# Patient Record
Sex: Female | Born: 1965
Health system: Southern US, Community
[De-identification: ages and names within clinical notes are randomized; demographics above are authoritative.]

## PROBLEM LIST (undated history)

## (undated) DIAGNOSIS — M5412 Radiculopathy, cervical region: Secondary | ICD-10-CM

## (undated) DIAGNOSIS — G43909 Migraine, unspecified, not intractable, without status migrainosus: Secondary | ICD-10-CM

## (undated) DIAGNOSIS — K760 Fatty (change of) liver, not elsewhere classified: Secondary | ICD-10-CM

## (undated) DIAGNOSIS — I10 Essential (primary) hypertension: Secondary | ICD-10-CM

## (undated) DIAGNOSIS — R1012 Left upper quadrant pain: Secondary | ICD-10-CM

## (undated) DIAGNOSIS — M24119 Other articular cartilage disorders, unspecified shoulder: Secondary | ICD-10-CM

## (undated) DIAGNOSIS — R232 Flushing: Secondary | ICD-10-CM

## (undated) DIAGNOSIS — J383 Other diseases of vocal cords: Secondary | ICD-10-CM

## (undated) DIAGNOSIS — Z8659 Personal history of other mental and behavioral disorders: Secondary | ICD-10-CM

## (undated) DIAGNOSIS — G4486 Cervicogenic headache: Secondary | ICD-10-CM

## (undated) DIAGNOSIS — M4802 Spinal stenosis, cervical region: Secondary | ICD-10-CM

## (undated) DIAGNOSIS — M542 Cervicalgia: Secondary | ICD-10-CM

## (undated) DIAGNOSIS — R0602 Shortness of breath: Secondary | ICD-10-CM

## (undated) DIAGNOSIS — O039 Complete or unspecified spontaneous abortion without complication: Secondary | ICD-10-CM

## (undated) DIAGNOSIS — M5030 Other cervical disc degeneration, unspecified cervical region: Secondary | ICD-10-CM

## (undated) DIAGNOSIS — R600 Localized edema: Secondary | ICD-10-CM

## (undated) DIAGNOSIS — K219 Gastro-esophageal reflux disease without esophagitis: Secondary | ICD-10-CM

## (undated) DIAGNOSIS — D3610 Benign neoplasm of peripheral nerves and autonomic nervous system, unspecified: Secondary | ICD-10-CM

## (undated) DIAGNOSIS — F419 Anxiety disorder, unspecified: Secondary | ICD-10-CM

## (undated) DIAGNOSIS — K589 Irritable bowel syndrome without diarrhea: Secondary | ICD-10-CM

## (undated) DIAGNOSIS — M539 Dorsopathy, unspecified: Secondary | ICD-10-CM

## (undated) DIAGNOSIS — M797 Fibromyalgia: Secondary | ICD-10-CM

## (undated) DIAGNOSIS — K649 Unspecified hemorrhoids: Secondary | ICD-10-CM

## (undated) DIAGNOSIS — G894 Chronic pain syndrome: Secondary | ICD-10-CM

## (undated) DIAGNOSIS — G8929 Other chronic pain: Secondary | ICD-10-CM

## (undated) HISTORY — DX: Fatty (change of) liver, not elsewhere classified: K76.0

## (undated) HISTORY — PX: CHOLECYSTECTOMY: SHX55

## (undated) HISTORY — PX: ELBOW DEBRIDEMENT: SHX931

## (undated) HISTORY — DX: Migraine, unspecified, not intractable, without status migrainosus: G43.909

## (undated) HISTORY — PX: COLONOSCOPY: SHX174

## (undated) HISTORY — DX: Left upper quadrant pain: R10.12

## (undated) HISTORY — PX: ABDOMINAL HYSTERECTOMY: SHX81

## (undated) HISTORY — DX: Complete or unspecified spontaneous abortion without complication: O03.9

## (undated) HISTORY — DX: Unspecified hemorrhoids: K64.9

## (undated) HISTORY — PX: FOOT SURGERY: SHX648

---

## 2014-09-24 LAB — HM COLONOSCOPY

## 2016-07-18 DIAGNOSIS — M5136 Other intervertebral disc degeneration, lumbar region: Secondary | ICD-10-CM | POA: Insufficient documentation

## 2016-07-18 DIAGNOSIS — Z800 Family history of malignant neoplasm of digestive organs: Secondary | ICD-10-CM | POA: Insufficient documentation

## 2016-10-25 DIAGNOSIS — R1012 Left upper quadrant pain: Secondary | ICD-10-CM | POA: Insufficient documentation

## 2016-10-25 HISTORY — DX: Left upper quadrant pain: R10.12

## 2016-12-14 LAB — HM COLONOSCOPY

## 2016-12-15 ENCOUNTER — Encounter (HOSPITAL_COMMUNITY): Payer: Self-pay

## 2016-12-15 DIAGNOSIS — I10 Essential (primary) hypertension: Secondary | ICD-10-CM | POA: Insufficient documentation

## 2016-12-15 DIAGNOSIS — R109 Unspecified abdominal pain: Secondary | ICD-10-CM | POA: Diagnosis present

## 2016-12-15 DIAGNOSIS — Z90710 Acquired absence of both cervix and uterus: Secondary | ICD-10-CM | POA: Diagnosis not present

## 2016-12-15 DIAGNOSIS — Z882 Allergy status to sulfonamides status: Secondary | ICD-10-CM | POA: Insufficient documentation

## 2016-12-15 DIAGNOSIS — K922 Gastrointestinal hemorrhage, unspecified: Secondary | ICD-10-CM | POA: Diagnosis present

## 2016-12-15 DIAGNOSIS — K760 Fatty (change of) liver, not elsewhere classified: Secondary | ICD-10-CM | POA: Insufficient documentation

## 2016-12-15 DIAGNOSIS — K589 Irritable bowel syndrome without diarrhea: Secondary | ICD-10-CM | POA: Diagnosis not present

## 2016-12-15 DIAGNOSIS — K625 Hemorrhage of anus and rectum: Secondary | ICD-10-CM | POA: Diagnosis present

## 2016-12-15 DIAGNOSIS — F329 Major depressive disorder, single episode, unspecified: Secondary | ICD-10-CM | POA: Insufficient documentation

## 2016-12-15 DIAGNOSIS — Z881 Allergy status to other antibiotic agents status: Secondary | ICD-10-CM | POA: Diagnosis not present

## 2016-12-15 DIAGNOSIS — M797 Fibromyalgia: Secondary | ICD-10-CM | POA: Diagnosis not present

## 2016-12-15 DIAGNOSIS — K219 Gastro-esophageal reflux disease without esophagitis: Secondary | ICD-10-CM | POA: Diagnosis not present

## 2016-12-15 DIAGNOSIS — Z79899 Other long term (current) drug therapy: Secondary | ICD-10-CM | POA: Insufficient documentation

## 2016-12-15 DIAGNOSIS — Z9049 Acquired absence of other specified parts of digestive tract: Secondary | ICD-10-CM | POA: Insufficient documentation

## 2016-12-15 LAB — URINALYSIS, ROUTINE W REFLEX MICROSCOPIC
Bilirubin Urine: NEGATIVE
GLUCOSE, UA: NEGATIVE mg/dL
HGB URINE DIPSTICK: NEGATIVE
Ketones, ur: 5 mg/dL — AB
LEUKOCYTES UA: NEGATIVE
Nitrite: NEGATIVE
PROTEIN: 30 mg/dL — AB
Specific Gravity, Urine: 1.026 (ref 1.005–1.030)
pH: 5 (ref 5.0–8.0)

## 2016-12-15 LAB — LIPASE, BLOOD: LIPASE: 33 U/L (ref 11–51)

## 2016-12-15 LAB — CBC
HEMATOCRIT: 40 % (ref 36.0–46.0)
Hemoglobin: 13.5 g/dL (ref 12.0–15.0)
MCH: 30.3 pg (ref 26.0–34.0)
MCHC: 33.8 g/dL (ref 30.0–36.0)
MCV: 89.7 fL (ref 78.0–100.0)
PLATELETS: 338 10*3/uL (ref 150–400)
RBC: 4.46 MIL/uL (ref 3.87–5.11)
RDW: 12.9 % (ref 11.5–15.5)
WBC: 11.2 10*3/uL — AB (ref 4.0–10.5)

## 2016-12-15 LAB — COMPREHENSIVE METABOLIC PANEL
ALT: 19 U/L (ref 14–54)
AST: 18 U/L (ref 15–41)
Albumin: 3.9 g/dL (ref 3.5–5.0)
Alkaline Phosphatase: 69 U/L (ref 38–126)
Anion gap: 12 (ref 5–15)
BILIRUBIN TOTAL: 0.6 mg/dL (ref 0.3–1.2)
BUN: 15 mg/dL (ref 6–20)
CHLORIDE: 103 mmol/L (ref 101–111)
CO2: 22 mmol/L (ref 22–32)
CREATININE: 1.02 mg/dL — AB (ref 0.44–1.00)
Calcium: 9 mg/dL (ref 8.9–10.3)
Glucose, Bld: 143 mg/dL — ABNORMAL HIGH (ref 65–99)
POTASSIUM: 3.7 mmol/L (ref 3.5–5.1)
Sodium: 137 mmol/L (ref 135–145)
Total Protein: 6.5 g/dL (ref 6.5–8.1)

## 2016-12-15 MED ORDER — ONDANSETRON 4 MG PO TBDP
4.0000 mg | ORAL_TABLET | Freq: Once | ORAL | Status: AC | PRN
  Administered 2016-12-15: 4 mg via ORAL

## 2016-12-15 MED ORDER — ONDANSETRON 4 MG PO TBDP
ORAL_TABLET | ORAL | Status: AC
  Filled 2016-12-15: qty 1

## 2016-12-15 NOTE — ED Triage Notes (Signed)
Pt states that she has a colonoscopy yesterday and has a polyp removed, today while at work started n/v x 1 and became diaphoretic after bloody BM.

## 2016-12-16 ENCOUNTER — Encounter (HOSPITAL_COMMUNITY): Payer: Self-pay | Admitting: Internal Medicine

## 2016-12-16 ENCOUNTER — Observation Stay (HOSPITAL_COMMUNITY): Payer: BLUE CROSS/BLUE SHIELD

## 2016-12-16 ENCOUNTER — Observation Stay (HOSPITAL_COMMUNITY)
Admission: EM | Admit: 2016-12-16 | Discharge: 2016-12-16 | Disposition: A | Discharge status: Home / Self Care | Payer: BLUE CROSS/BLUE SHIELD | Attending: Internal Medicine | Admitting: Internal Medicine

## 2016-12-16 DIAGNOSIS — M533 Sacrococcygeal disorders, not elsewhere classified: Secondary | ICD-10-CM

## 2016-12-16 DIAGNOSIS — K922 Gastrointestinal hemorrhage, unspecified: Principal | ICD-10-CM

## 2016-12-16 DIAGNOSIS — I10 Essential (primary) hypertension: Secondary | ICD-10-CM

## 2016-12-16 DIAGNOSIS — R109 Unspecified abdominal pain: Secondary | ICD-10-CM

## 2016-12-16 DIAGNOSIS — F329 Major depressive disorder, single episode, unspecified: Secondary | ICD-10-CM

## 2016-12-16 DIAGNOSIS — K5900 Constipation, unspecified: Secondary | ICD-10-CM

## 2016-12-16 DIAGNOSIS — K625 Hemorrhage of anus and rectum: Secondary | ICD-10-CM

## 2016-12-16 DIAGNOSIS — G8929 Other chronic pain: Secondary | ICD-10-CM | POA: Diagnosis present

## 2016-12-16 HISTORY — DX: Essential (primary) hypertension: I10

## 2016-12-16 HISTORY — DX: Irritable bowel syndrome, unspecified: K58.9

## 2016-12-16 HISTORY — DX: Flushing: R23.2

## 2016-12-16 HISTORY — DX: Fibromyalgia: M79.7

## 2016-12-16 LAB — CBC
HCT: 32.5 % — ABNORMAL LOW (ref 36.0–46.0)
HEMATOCRIT: 34.5 % — AB (ref 36.0–46.0)
HEMOGLOBIN: 11.6 g/dL — AB (ref 12.0–15.0)
Hemoglobin: 11 g/dL — ABNORMAL LOW (ref 12.0–15.0)
MCH: 30.1 pg (ref 26.0–34.0)
MCH: 30.4 pg (ref 26.0–34.0)
MCHC: 33.6 g/dL (ref 30.0–36.0)
MCHC: 33.8 g/dL (ref 30.0–36.0)
MCV: 89.4 fL (ref 78.0–100.0)
MCV: 89.8 fL (ref 78.0–100.0)
Platelets: 233 10*3/uL (ref 150–400)
Platelets: 222 10*3/uL (ref 150–400)
RBC: 3.86 MIL/uL — ABNORMAL LOW (ref 3.87–5.11)
RBC: 3.62 MIL/uL — AB (ref 3.87–5.11)
RDW: 13.4 % (ref 11.5–15.5)
RDW: 13.1 % (ref 11.5–15.5)
WBC: 10 10*3/uL (ref 4.0–10.5)
WBC: 9.9 10*3/uL (ref 4.0–10.5)

## 2016-12-16 LAB — TYPE AND SCREEN
ABO/RH(D): A POS
ANTIBODY SCREEN: NEGATIVE

## 2016-12-16 LAB — ABO/RH: ABO/RH(D): A POS

## 2016-12-16 MED ORDER — HYDROCHLOROTHIAZIDE 25 MG PO TABS
12.5000 mg | ORAL_TABLET | Freq: Every day | ORAL | Status: DC
  Filled 2016-12-16: qty 1

## 2016-12-16 MED ORDER — LINZESS 290 MCG PO CAPS: 290.0000 ug | ORAL_CAPSULE | Freq: Every day | ORAL | Status: DC

## 2016-12-16 MED ORDER — ACETAMINOPHEN 325 MG PO TABS: 650.0000 mg | ORAL_TABLET | Freq: Four times a day (QID) | ORAL | Status: DC | PRN

## 2016-12-16 MED ORDER — ACETAMINOPHEN 650 MG RE SUPP: 650.0000 mg | Freq: Four times a day (QID) | RECTAL | Status: DC | PRN

## 2016-12-16 MED ORDER — GABAPENTIN 300 MG PO CAPS
300.0000 mg | ORAL_CAPSULE | Freq: Two times a day (BID) | ORAL | Status: DC
  Administered 2016-12-16: 300 mg via ORAL
  Filled 2016-12-16: qty 1

## 2016-12-16 MED ORDER — TRAMADOL HCL 50 MG PO TABS
50.0000 mg | ORAL_TABLET | Freq: Four times a day (QID) | ORAL | Status: DC | PRN
  Administered 2016-12-16: 50 mg via ORAL
  Filled 2016-12-16: qty 1

## 2016-12-16 MED ORDER — CEFUROXIME AXETIL 500 MG PO TABS
500.0000 mg | ORAL_TABLET | Freq: Two times a day (BID) | ORAL | Status: DC
  Administered 2016-12-16 (×2): 500 mg via ORAL
  Filled 2016-12-16 (×3): qty 1

## 2016-12-16 MED ORDER — SERTRALINE HCL 50 MG PO TABS: 50.0000 mg | ORAL_TABLET | Freq: Every day | ORAL | Status: DC

## 2016-12-16 MED ORDER — SODIUM CHLORIDE 0.9% FLUSH: 3.0000 mL | Freq: Two times a day (BID) | INTRAVENOUS | Status: DC

## 2016-12-16 MED ORDER — PREDNISONE 20 MG PO TABS: 20.0000 mg | ORAL_TABLET | Freq: Every day | ORAL | Status: DC

## 2016-12-16 MED ORDER — IOPAMIDOL (ISOVUE-300) INJECTION 61%
INTRAVENOUS | Status: AC
  Administered 2016-12-16: 100 mL
  Filled 2016-12-16: qty 100

## 2016-12-16 MED ORDER — OXYCODONE-ACETAMINOPHEN 5-325 MG PO TABS
1.0000 | ORAL_TABLET | Freq: Four times a day (QID) | ORAL | Status: DC | PRN
  Administered 2016-12-16: 1 via ORAL
  Filled 2016-12-16: qty 1

## 2016-12-16 MED ORDER — PREDNISONE 10 MG PO TABS: 10.0000 mg | ORAL_TABLET | Freq: Every day | ORAL | Status: DC

## 2016-12-16 MED ORDER — PANTOPRAZOLE SODIUM 40 MG PO TBEC
40.0000 mg | DELAYED_RELEASE_TABLET | Freq: Every day | ORAL | Status: DC
  Administered 2016-12-16: 40 mg via ORAL
  Filled 2016-12-16: qty 1

## 2016-12-16 MED ORDER — PROMETHAZINE HCL 25 MG/ML IJ SOLN: 6.2500 mg | Freq: Four times a day (QID) | INTRAMUSCULAR | Status: DC | PRN

## 2016-12-16 MED ORDER — PREDNISONE 10 MG (21) PO TBPK: 10.0000 mg | ORAL_TABLET | ORAL | Status: DC

## 2016-12-16 MED ORDER — ONDANSETRON HCL 4 MG/2ML IJ SOLN: 4.0000 mg | Freq: Four times a day (QID) | INTRAMUSCULAR | Status: DC | PRN

## 2016-12-16 MED ORDER — PREDNISONE 20 MG PO TABS: 30.0000 mg | ORAL_TABLET | Freq: Every day | ORAL | Status: DC

## 2016-12-16 MED ORDER — PREDNISONE 20 MG PO TABS
40.0000 mg | ORAL_TABLET | Freq: Every day | ORAL | Status: AC
  Administered 2016-12-16: 40 mg via ORAL
  Filled 2016-12-16: qty 2

## 2016-12-16 NOTE — Discharge Summary (Signed)
Physician Discharge Summary  [name] JHE:[id number] DOB: [birth year] DOA: 12/16/2016  PCP: Clarisse Gouge, MD  Admit date: 12/16/2016 Discharge date: 12/16/2016  Time spent: 45 minutes  Recommendations for Outpatient Follow-up:  Patient will be discharged to home.  Patient will need to follow up with primary care provider within one week of discharge.  Patient should continue medications as prescribed.  Patient should follow a heart healthy diet.   Discharge Diagnoses:  Rectal bleeding Hypertension GERD Depression Constipation  Discharge Condition: stable   Diet recommendation: heart healthy  Filed Weights   12/16/16 0852  Weight: 72.7 kg (160 lb 3.2 oz)    History of present illness:  On 12/16/2016 by Dr. Jani Gravel  Elizabeth Bowers  is a 51 y.o. female, w Hypertension, Jerrye Bowers,  family hx of colon cancer, s/p colonscopy 7/12, polypectomy who presents with rectal bleeding x2 starting yesterday after she came to work last nite.  Pt also noted some abdominal pain generalized starting after midnite.  Denies fever, chills, diarrhea, black stool. Pt presented to ED for evaluation  In ED, Hgb 13.5,  Pt will be admitted for rectal bleed x2.    Hospital Course:  Rectal bleeding -Likely secondary to recent colonoscopy with polypectomy on 12/14/2016 -Hemoglobin baseline 13, currently 11 -Suspect drop in hemoglobin lagging given patient reported loosing large amount of blood yesterday prior to admission -Admitting physician spoke to gastroenterology who recommended CT abdomen -Admitting physician also spoke with [facility], felt to bleed is likely self-limited and can follow-up as an outpatient -CT abdomen pelvis: No acute findings, no evidence of complication following colonoscopy, no evidence of bowel injury or perforation -Patient would like to go home today, will follow up with her GI doc or return to the hospital if bleeding recurs. Currently patient has had no further episodes of bleeding.    -Discussed signs and symptoms of anemia.  -repeat CBC in one week.   Hypertension -Continue HCTZ  GERD -Continue PPI  Depression -Continue Zoloft  Constipation -Continue linzess  Procedures: None  Consultations: Gastroenterology via phone  Discharge Exam: Vitals:   12/16/16 0852 12/16/16 1522  BP: (!) 129/54 118/64  Pulse: 60 63  Resp: 16 16  Temp: 98.5 F (36.9 C) 98.2 F (36.8 C)   Denies further bleeding. Feels very tired it would like to sleep. Currently denies chest pain, shortness breath, abdominal pain, nausea or vomiting, dizziness.   General: Well developed, well nourished, NAD, appears stated age  HEENT: NCAT, mucous membranes moist.  Cardiovascular: S1 S2 auscultated, no rubs, murmurs or gallops. Regular rate and rhythm.  Respiratory: Clear to auscultation bilaterally with equal chest rise  Abdomen: Soft, nontender, nondistended, + bowel sounds  Extremities: warm dry without cyanosis clubbing or edema  Neuro: AAOx3, nonfocal  Psych: Normal affect and demeanor with intact judgement and insight  Discharge Instructions Discharge Instructions    Discharge instructions    Complete by:  As directed    Patient will be discharged to home.  Patient will need to follow up with primary care provider within one week of discharge, repeat CBC. Follow up with [place] gastro[facility].  Patient should continue medications as prescribed.  Patient should follow a heart healthy diet.     Current Discharge Medication List    CONTINUE these medications which have CHANGED   Details  LINZESS 290 MCG CAPS capsule Take 1 capsule (290 mcg total) by mouth daily. Qty: 30 capsule      CONTINUE these medications which have NOT CHANGED  Details  cefdinir (OMNICEF) 300 MG capsule Take 300 mg by mouth 2 (two) times daily. Refills: 0    DEXILANT 60 MG capsule Take 60 mg by mouth 2 (two) times daily.    gabapentin (NEURONTIN) 300 MG capsule Take 300 mg by mouth 2 (two)  times daily.    hydrochlorothiazide (HYDRODIURIL) 25 MG tablet Take 12.5 mg by mouth daily.    !! Multiple Vitamin (MULTIVITAMIN WITH MINERALS) TABS tablet Take 1 tablet by mouth daily.    !! Multiple Vitamins-Minerals (HAIR/SKIN/NAILS) TABS Take 1 tablet by mouth daily.    POTASSIUM PO Take 1 tablet by mouth daily. OTC    predniSONE (STERAPRED UNI-PAK 21 TAB) 10 MG (21) TBPK tablet Take 10-60 mg by mouth See admin instructions. Take 6 tablets for 2 days then decrease by 1 pill every 2 days for 12 days    sertraline (ZOLOFT) 100 MG tablet Take 50-100 mg by mouth at bedtime. Refills: 3     !! - Potential duplicate medications found. Please discuss with provider.     Allergies  Allergen Reactions  . Ranitidine Diarrhea and Other (See Comments)    Diarrhea and stomach cramping  . Amphetamine-Dextroamphetamine Other (See Comments)    Elevated BP  . Reglan [Metoclopramide] Other (See Comments)    Increased prolactin level   . Sulfa Antibiotics Anxiety   Follow-up Information    Clarisse Gouge, MD. Schedule an appointment as soon as possible for a visit in 1 week(s).   Specialty:  Family Medicine Why:  Hospital follow up Contact information: [address] [place] [address] [phone]            The results of significant diagnostics from this hospitalization (including imaging, microbiology, ancillary and laboratory) are listed below for reference.    Significant Diagnostic Studies: Ct Abdomen Pelvis W Contrast  Result Date: 12/16/2016 CLINICAL DATA:  Pt states that she has a colonoscopy 12/14/2016, had a polyp removed, Yesterday about 10:00pm at work started n/v and became diaphoretic after bloody BM. EXAM: CT ABDOMEN AND PELVIS WITH CONTRAST TECHNIQUE: Multidetector CT imaging of the abdomen and pelvis was performed using the standard protocol following bolus administration of intravenous contrast. CONTRAST:  130mL ISOVUE-300 IOPAMIDOL  (ISOVUE-300) INJECTION 61% COMPARISON:  None. FINDINGS: Lower chest: Clear lung bases.  Heart normal in size. Hepatobiliary: Fatty infiltration of the liver. No liver mass. Small area of focal fatty infiltration adjacent to the falciform ligament. Gallbladder surgically absent. No bile duct dilation. Pancreas: Unremarkable. No pancreatic ductal dilatation or surrounding inflammatory changes. Spleen: Normal in size without focal abnormality. Adrenals/Urinary Tract: Adrenal glands are unremarkable. Kidneys are normal, without renal calculi, focal lesion, or hydronephrosis. Bladder is unremarkable. Stomach/Bowel: No colonic wall thickening, inflammation or evidence of perforation. Stomach and small bowel are unremarkable. No free air. Vascular/Lymphatic: No significant vascular findings are present. No enlarged abdominal or pelvic lymph nodes. Reproductive: Status post hysterectomy. No adnexal masses. Other: No abdominal wall hernia or abnormality. No abdominopelvic ascites. Musculoskeletal: No fracture or acute finding. No osteoblastic or osteolytic lesions. Significant degenerative changes are noted at L5-S1. IMPRESSION: 1. No acute findings. No evidence of a complication following colonoscopy. Specifically no evidence of bowel injury or perforation. 2. Hepatic steatosis. 3. Status post cholecystectomy and hysterectomy. Electronically Signed   By: Lajean Manes M.D.   On: 12/16/2016 08:24    Microbiology: No results found for this or any previous visit (from the past 240 hour(s)).   Labs: Basic Metabolic Panel:  Recent Labs Lab 12/15/16 2235  NA 137  K 3.7  CL 103  CO2 22  GLUCOSE 143*  BUN 15  CREATININE 1.02*  CALCIUM 9.0   Liver Function Tests:  Recent Labs Lab 12/15/16 2235  AST 18  ALT 19  ALKPHOS 69  BILITOT 0.6  PROT 6.5  ALBUMIN 3.9    Recent Labs Lab 12/15/16 2235  LIPASE 33   No results for input(s): AMMONIA in the last 168 hours. CBC:  Recent Labs Lab  12/15/16 2235 12/16/16 0904 12/16/16 1742  WBC 11.2* 10.0 9.9  HGB 13.5 11.6* 11.0*  HCT 40.0 34.5* 32.5*  MCV 89.7 89.4 89.8  PLT 338 233 222   Cardiac Enzymes: No results for input(s): CKTOTAL, CKMB, CKMBINDEX, TROPONINI in the last 168 hours. BNP: BNP (last 3 results) No results for input(s): BNP in the last 8760 hours.  ProBNP (last 3 results) No results for input(s): PROBNP in the last 8760 hours.  CBG: No results for input(s): GLUCAP in the last 168 hours.     SignedCristal Ford  [facility] 12/16/2016, 6:16 PM

## 2016-12-16 NOTE — ED Provider Notes (Signed)
[place] DEPT Provider Note   CSN: [id number] Arrival date & time: 12/15/16  2220     History   Chief Complaint Chief Complaint  Patient presents with  . Rectal Bleeding  . Emesis    HPI Elizabeth Bowers is a 51 y.o. female.  HPI Patient is a 51 year old female presents emergency department with bright red blood from her rectum twice tonight.  She underwent colonoscopy yesterday at [facility] and underwent polypectomy at that time.  She is not on anticoagulants.  She states after the procedure she had a normal brown bowel movement.  The acute bleeding was noted tonight.  She initially had a significant amount of blood on the first episode.  She reports the second episode was less.  She has no lightheadedness or weakness.  Denies significant abdominal pain.  Symptoms are mild to moderate in severity   Past Medical History:  Diagnosis Date  . Fibromyalgia   . Hot flashes   . Hypertension   . IBS (irritable bowel syndrome)     There are no active problems to display for this patient.   Past Surgical History:  Procedure Laterality Date  . ABDOMINAL HYSTERECTOMY    . CHOLECYSTECTOMY    . ELBOW DEBRIDEMENT    . FOOT SURGERY      OB History    No data available       Home Medications    Prior to Admission medications   Medication Sig Start Date End Date Taking? Authorizing Provider  cefdinir (OMNICEF) 300 MG capsule Take 300 mg by mouth 2 (two) times daily. 12/11/16  Yes [provider]  DEXILANT 60 MG capsule Take 60 mg by mouth 2 (two) times daily. 11/21/16  Yes [provider]  gabapentin (NEURONTIN) 300 MG capsule Take 300 mg by mouth 2 (two) times daily. 12/13/16  Yes [provider]  hydrochlorothiazide (HYDRODIURIL) 25 MG tablet Take 12.5 mg by mouth daily. 12/14/16  Yes [provider]  LINZESS 290 MCG CAPS capsule Take 290 mg by mouth daily. 12/13/16  Yes [provider]  Multiple Vitamin (MULTIVITAMIN WITH MINERALS)  TABS tablet Take 1 tablet by mouth daily.   Yes [provider]  Multiple Vitamins-Minerals (HAIR/SKIN/NAILS) TABS Take 1 tablet by mouth daily.   Yes [provider]  POTASSIUM PO Take 1 tablet by mouth daily. OTC   Yes [provider]  predniSONE (STERAPRED UNI-PAK 21 TAB) 10 MG (21) TBPK tablet Take 10-60 mg by mouth See admin instructions. Take 6 tablets for 2 days then decrease by 1 pill every 2 days for 12 days 12/11/16  Yes [provider]  sertraline (ZOLOFT) 100 MG tablet Take 50-100 mg by mouth at bedtime. 11/20/16  Yes [provider]    Family History No family history on file.  Social History Social History  Substance Use Topics  . Smoking status: Never Smoker  . Smokeless tobacco: Never Used  . Alcohol use No     Allergies   Ranitidine; Amphetamine-dextroamphetamine; Reglan [metoclopramide]; and Sulfa antibiotics   Review of Systems Review of Systems  All other systems reviewed and are negative.    Physical Exam Updated Vital Signs BP 126/80 (BP Location: Right Arm)   Pulse (!) 56   Temp 98.4 F (36.9 C) (Oral)   Resp 16   SpO2 100%   Physical Exam  Constitutional: She is oriented to person, place, and time. She appears well-developed and well-nourished.  HENT:  Head: Normocephalic.  Eyes: EOM are  normal.  Neck: Normal range of motion.  Cardiovascular: Normal rate.   Pulmonary/Chest: Effort normal and breath sounds normal.  Abdominal: She exhibits no distension. There is no tenderness.  Musculoskeletal: Normal range of motion.  Neurological: She is alert and oriented to person, place, and time.  Psychiatric: She has a normal mood and affect.  Nursing note and vitals reviewed.    ED Treatments / Results  Labs (all labs ordered are listed, but only abnormal results are displayed) Labs Reviewed  COMPREHENSIVE METABOLIC PANEL - Abnormal; Notable for the following:       Result Value   Glucose, Bld 143 (*)     Creatinine, Ser 1.02 (*)    All other components within normal limits  CBC - Abnormal; Notable for the following:    WBC 11.2 (*)    All other components within normal limits  URINALYSIS, ROUTINE W REFLEX MICROSCOPIC - Abnormal; Notable for the following:    Color, Urine AMBER (*)    APPearance CLOUDY (*)    Ketones, ur 5 (*)    Protein, ur 30 (*)    Bacteria, UA RARE (*)    Squamous Epithelial / LPF 6-30 (*)    All other components within normal limits  LIPASE, BLOOD    EKG  EKG Interpretation None       Radiology No results found.  Procedures Procedures (including critical care time)  Medications Ordered in ED Medications  ondansetron (ZOFRAN-ODT) disintegrating tablet 4 mg (4 mg Oral Given 12/15/16 2235)     Initial Impression / Assessment and Plan / ED Course  I have reviewed the triage vital signs and the nursing notes.  Pertinent labs & imaging results that were available during my care of the patient were reviewed by me and considered in my medical decision making (see chart for details).      Patient will be admitted to the hospital for lower GI bleed.   Last episode was less bleeding than the first.      Final Clinical Impressions(s) / ED Diagnoses   Final diagnoses:  Lower GI bleeding    New Prescriptions New Prescriptions   No medications on file     Jola Schmidt, MD 12/16/16 [phone]

## 2016-12-16 NOTE — Discharge Instructions (Signed)
Gastrointestinal Bleeding °Gastrointestinal (GI) bleeding is bleeding somewhere along the digestive tract, between the mouth and anus. This can be caused by various problems. The severity of these problems can range from mild to serious or even life-threatening. If you have GI bleeding, you may find blood in your stools (feces), you may have black stools, or you may vomit blood. If there is a lot of bleeding, you may need to stay in the hospital. °What are the causes? °This condition may be caused by: °· Esophagitis. This is inflammation, irritation, or swelling of the esophagus. °· Hemorrhoids. These are swollen veins in the rectum. °· Anal fissures. These are areas of painful tearing that are often caused by passing hard stool. °· Diverticulosis. These are pouches that form on the colon over time, with age, and may bleed a lot. °· Diverticulitis. This is inflammation in areas with diverticulosis. It can cause pain, fever, and bloody stools, although bleeding may be mild. °· Polyps and cancer. Colon cancer often starts out as precancerous polyps. °· Gastritis and ulcers. With these, bleeding may come from the upper GI tract, near the stomach. ° °What are the signs or symptoms? °Symptoms of this condition may include: °· Bright red blood in your vomit, or vomit that looks like coffee grounds. °· Bloody, black, or tarry stools. °? Bleeding from the lower GI tract will usually cause red or maroon blood in the stools. °? Bleeding from the upper GI tract may cause black, tarry, often bad-smelling stools. °? In certain cases, if the bleeding is fast enough, the stools may be red. °· Pain or cramping in the abdomen. ° °How is this diagnosed? °This condition may be diagnosed based on: °· Medical history and physical exam. °· Various tests, such as: °? Blood tests. °? X-rays and other imaging tests. °? Esophagogastroduodenoscopy (EGD). In this test, a flexible, lighted tube is used to look at your esophagus, stomach, and  small intestine. °? Colonoscopy. In this test, a flexible, lighted tube is used to look at your colon. ° °How is this treated? °Treatment for this condition depends on the cause of the bleeding. For example: °· For bleeding from the esophagus, stomach, small intestine, or colon, the health care provider doing your EGD or colonoscopy may be able to stop the bleeding as part of the procedure. °· Inflammation or infection of the colon can be treated with medicines. °· Certain rectal problems can be treated with creams, suppositories, or warm baths. °· Surgery is sometimes needed. °· Blood transfusions are sometimes needed if a lot of blood has been lost. ° °If bleeding is slow, you may be allowed to go home. If there is a lot of bleeding, you will need to stay in the hospital for observation. °Follow these instructions at home: °· Take over-the-counter and prescription medicines only as told by your health care provider. °· Eat foods that are high in fiber. This will help to keep your stools soft. These foods include whole grains, legumes, fruits, and vegetables. Eating 1-3 prunes each day works well for many people. °· Drink enough fluid to keep your urine clear or pale yellow. °· Keep all follow-up visits as told by your health care provider. This is important. °Contact a health care provider if: °· Your symptoms do not improve. °Get help right away if: °· Your bleeding increases. °· You feel light-headed or you faint. °· You feel weak. °· You have severe cramps in your back or abdomen. °· You pass large blood clots in your stool. °·   Your symptoms are getting worse. °This information is not intended to replace advice given to you by your health care provider. Make sure you discuss any questions you have with your health care provider. °Document Released: 05/19/2000 Document Revised: 10/20/2015 Document Reviewed: 11/09/2014 °[employer] Interactive Patient Education © 2018 [employer] ° °

## 2016-12-16 NOTE — H&P (Signed)
TRH H&P   Patient Demographics:    Elizabeth Bowers, is a 51 y.o. female  MRN: [id number]   DOB - Oct 18, 1965  Admit Date - 12/16/2016  Outpatient Primary MD for the patient is Clarisse Gouge, MD  Referring MD/NP/PA:  Dr. Venora Maples  Outpatient Specialists: Margit Hanks [facility], GI) Patient coming from: [employer] (works there)  Chief Complaint  Patient presents with  . Rectal Bleeding  . Emesis      HPI:    Elizabeth Bowers  is a 51 y.o. female, w Hypertension, Jerrye Bushy,  family hx of colon cancer, s/p colonscopy 7/12, polypectomy who presents with rectal bleeding x2 starting yesterday after she came to work last nite.  Pt also noted some abdominal pain generalized starting after midnite.  Denies fever, chills, diarrhea, black stool. Pt presented to ED for evaluation   In ED, Hgb 13.5,  Pt will be admitted for rectal bleed x2.      Review of systems:    In addition to the HPI above,  No Fever-chills, No Headache, No changes with Vision or hearing, No problems swallowing food or Liquids, No Chest pain, Cough or Shortness of Breath,  No Blood in stool or Urine, No dysuria, No new skin rashes or bruises, No new joints pains-aches,  No new weakness, tingling, numbness in any extremity, No recent weight gain or loss, No polyuria, polydypsia or polyphagia, No significant Mental Stressors.  A full 10 point Review of Systems was done, except as stated above, all other Review of Systems were negative.   With Past History of the following :    Past Medical History:  Diagnosis Date  . Fibromyalgia   . Hot flashes   . Hypertension   . IBS (irritable bowel syndrome)       Past Surgical History:  Procedure Laterality Date  . ABDOMINAL HYSTERECTOMY    . CHOLECYSTECTOMY    . ELBOW DEBRIDEMENT    . FOOT SURGERY        Social History:     Social History  Substance Use Topics  .  Smoking status: Never Smoker  . Smokeless tobacco: Never Used  . Alcohol use No     Lives - at home with husband  Mobility - walks by self     Family History :     Family History  Problem Relation Age of Onset  . Hypertension Mother   . Hyperlipidemia Mother   . Glaucoma Mother   . Diabetes Mother   . COPD Father   . Colon cancer Sister   . Colon polyps Sister       Home Medications:   Prior to Admission medications   Medication Sig Start Date End Date Taking? Authorizing Provider  cefdinir (OMNICEF) 300 MG capsule Take 300 mg by mouth 2 (two) times daily. 12/11/16  Yes [provider]  DEXILANT 60 MG capsule Take  60 mg by mouth 2 (two) times daily. 11/21/16  Yes [provider]  gabapentin (NEURONTIN) 300 MG capsule Take 300 mg by mouth 2 (two) times daily. 12/13/16  Yes [provider]  hydrochlorothiazide (HYDRODIURIL) 25 MG tablet Take 12.5 mg by mouth daily. 12/14/16  Yes [provider]  LINZESS 290 MCG CAPS capsule Take 290 mg by mouth daily. 12/13/16  Yes [provider]  Multiple Vitamin (MULTIVITAMIN WITH MINERALS) TABS tablet Take 1 tablet by mouth daily.   Yes [provider]  Multiple Vitamins-Minerals (HAIR/SKIN/NAILS) TABS Take 1 tablet by mouth daily.   Yes [provider]  POTASSIUM PO Take 1 tablet by mouth daily. OTC   Yes [provider]  predniSONE (STERAPRED UNI-PAK 21 TAB) 10 MG (21) TBPK tablet Take 10-60 mg by mouth See admin instructions. Take 6 tablets for 2 days then decrease by 1 pill every 2 days for 12 days 12/11/16  Yes [provider]  sertraline (ZOLOFT) 100 MG tablet Take 50-100 mg by mouth at bedtime. 11/20/16  Yes [provider]     Allergies:     Allergies  Allergen Reactions  . Ranitidine Diarrhea and Other (See Comments)    Diarrhea and stomach cramping  . Amphetamine-Dextroamphetamine Other (See Comments)    Elevated BP  . Reglan  [Metoclopramide] Other (See Comments)    Increased prolactin level   . Sulfa Antibiotics Anxiety     Physical Exam:   Vitals  Blood pressure 127/80, pulse 62, temperature 98.4 F (36.9 C), temperature source Oral, resp. rate 16, SpO2 97 %.   1. General lying in bed in NAD,    2. Normal affect and insight, Not Suicidal or Homicidal, Awake Alert, Oriented X 3.  3. No F.N deficits, ALL C.Nerves Intact, Strength 5/5 all 4 extremities, Sensation intact all 4 extremities, Plantars down going.  4. Ears and Eyes appear Normal, Conjunctivae clear, PERRLA. Moist Oral Mucosa.  5. Supple Neck, No JVD, No cervical lymphadenopathy appriciated, No Carotid Bruits.  6. Symmetrical Chest wall movement, Good air movement bilaterally, CTAB.  7. RRR, No Gallops, Rubs or Murmurs, No Parasternal Heave.  8. Positive Bowel Sounds, Abdomen Soft, No tenderness, No organomegaly appriciated,No rebound -guarding or rigidity.  9.  No Cyanosis, Normal Skin Turgor, No Skin Rash or Bruise.  10. Good muscle tone,  joints appear normal , no effusions, Normal ROM.  11. No Palpable Lymph Nodes in Neck or Axillae     Data Review:    CBC  Recent Labs Lab 12/15/16 2235  WBC 11.2*  HGB 13.5  HCT 40.0  PLT 338  MCV 89.7  MCH 30.3  MCHC 33.8  RDW 12.9   ------------------------------------------------------------------------------------------------------------------  Chemistries   Recent Labs Lab 12/15/16 2235  NA 137  K 3.7  CL 103  CO2 22  GLUCOSE 143*  BUN 15  CREATININE 1.02*  CALCIUM 9.0  AST 18  ALT 19  ALKPHOS 69  BILITOT 0.6   ------------------------------------------------------------------------------------------------------------------ CrCl cannot be calculated (Unknown ideal weight.). ------------------------------------------------------------------------------------------------------------------ No results for input(s): TSH, T4TOTAL, T3FREE, THYROIDAB in the last 72  hours.  Invalid input(s): FREET3  Coagulation profile No results for input(s): INR, PROTIME in the last 168 hours. ------------------------------------------------------------------------------------------------------------------- No results for input(s): DDIMER in the last 72 hours. -------------------------------------------------------------------------------------------------------------------  Cardiac Enzymes No results for input(s): CKMB, TROPONINI, MYOGLOBIN in the last 168 hours.  Invalid input(s): CK ------------------------------------------------------------------------------------------------------------------ No results found for: BNP   ---------------------------------------------------------------------------------------------------------------  Urinalysis    Component Value Date/Time  COLORURINE AMBER (A) 12/15/2016 2237   APPEARANCEUR CLOUDY (A) 12/15/2016 2237   LABSPEC 1.026 12/15/2016 2237   PHURINE 5.0 12/15/2016 2237   GLUCOSEU NEGATIVE 12/15/2016 2237   HGBUR NEGATIVE 12/15/2016 2237   BILIRUBINUR NEGATIVE 12/15/2016 2237   KETONESUR 5 (A) 12/15/2016 2237   PROTEINUR 30 (A) 12/15/2016 2237   NITRITE NEGATIVE 12/15/2016 2237   LEUKOCYTESUR NEGATIVE 12/15/2016 2237    ----------------------------------------------------------------------------------------------------------------   Imaging Results:    No results found.    Assessment & Plan:    Active Problems:   Rectal bleeding   Abdominal pain    Rectal bleeding NPO Type and screen GI consult Serial cbc q6h  Abdominal pain CT scan abd/pelvis  Hypertension Cont current bp medications  Gerd Dexilant   DVT Prophylaxis  SCDs   AM Labs Ordered, also please review Full Orders  Family Communication: Admission, patients condition and plan of care including tests being ordered have been discussed with the patient  who indicate understanding and agree with the plan and Code  Status.  Code Status FULL CODE  Likely DC to  home  Condition GUARDED    Consults called: GI   Admission status: observation  Time spent in minutes : 45 minutes   Jani Gravel M.D on 12/16/2016 at 6:16 AM  Between 7pm to 7am - Pager - [phone]. After 7am go to [email or web address] - password [email or web address]  [facility] - Office  [phone]

## 2016-12-16 NOTE — Progress Notes (Signed)
Discussed AVS with pt. PIV removed. VSS, Skin intact. Pt walked off unit with husband.

## 2016-12-16 NOTE — Progress Notes (Addendum)
Polyp was in the cecum according to [facility] and fairly large 32mm and had to be lifted.  They agree if self limted and hgb stable, can follow up as outpatient.

## 2016-12-16 NOTE — Progress Notes (Signed)
Attempted to contact [facility] x2,  No response Earlier spoke with Clarene Essex who agreed with CT scan Recommended advancing diet to clear liquids and see if bleeding self limited.   Please consult GI after CT scan performed.

## 2016-12-18 LAB — HIV ANTIBODY (ROUTINE TESTING W REFLEX): HIV SCREEN 4TH GENERATION: NONREACTIVE

## 2017-01-12 ENCOUNTER — Encounter (INDEPENDENT_AMBULATORY_CARE_PROVIDER_SITE_OTHER): Payer: Self-pay | Admitting: Vascular Surgery

## 2017-01-12 ENCOUNTER — Ambulatory Visit (INDEPENDENT_AMBULATORY_CARE_PROVIDER_SITE_OTHER): Payer: BLUE CROSS/BLUE SHIELD | Admitting: Vascular Surgery

## 2017-01-12 VITALS — BP 135/83 | HR 58 | Resp 16 | Ht 62.0 in | Wt 164.0 lb

## 2017-01-12 DIAGNOSIS — I808 Phlebitis and thrombophlebitis of other sites: Secondary | ICD-10-CM | POA: Diagnosis not present

## 2017-01-12 DIAGNOSIS — I10 Essential (primary) hypertension: Secondary | ICD-10-CM | POA: Diagnosis not present

## 2017-01-12 DIAGNOSIS — I809 Phlebitis and thrombophlebitis of unspecified site: Secondary | ICD-10-CM | POA: Insufficient documentation

## 2017-01-12 NOTE — Assessment & Plan Note (Signed)
blood pressure control important in reducing the progression of atherosclerotic disease. On appropriate oral medications.  

## 2017-01-12 NOTE — Progress Notes (Signed)
Patient ID: Elizabeth Bowers, female   DOB: [birth year], 50 y.o.   MRN: [id number]  Chief Complaint  Patient presents with  . New Patient (Initial Visit)    Superficial thrombophlebitis    HPI Elizabeth Bowers is a 51 y.o. female.  I am asked to see the patient by DR. Trisha Mangle for evaluation of left arm superficial thrombophlebitis.  The patient reports Having an IV and a blood draw on the left arm developing superficial thrombophlebitis following this several weeks ago. She has been doing ice and elevation to the area and the sites have decreased in terms of their size, but her pain, and tenderness but they remained significantly symptomatic. She has some numbness that goes down into her fingers. She does not have any fever or chills. She has no chest pain or shortness of breath. She has no previous history of DVT or superficial thrombophlebitis to her knowledge.   Past Medical History:  Diagnosis Date  . Fibromyalgia   . Hot flashes   . Hypertension   . IBS (irritable bowel syndrome)     Past Surgical History:  Procedure Laterality Date  . ABDOMINAL HYSTERECTOMY    . CHOLECYSTECTOMY    . ELBOW DEBRIDEMENT    . FOOT SURGERY      Family History  Problem Relation Age of Onset  . Hypertension Mother   . Hyperlipidemia Mother   . Glaucoma Mother   . Diabetes Mother   . COPD Father   . Colon cancer Sister   . Colon polyps Sister     Social History Social History  Substance Use Topics  . Smoking status: Never Smoker  . Smokeless tobacco: Never Used  . Alcohol use No    Allergies  Allergen Reactions  . Ranitidine Diarrhea and Other (See Comments)    Diarrhea and stomach cramping  . Amphetamine-Dextroamphetamine Other (See Comments)    Elevated BP  . Reglan [Metoclopramide] Other (See Comments)    Increased prolactin level   . Sulfa Antibiotics Anxiety    Current Outpatient Prescriptions  Medication Sig Dispense Refill  . DEXILANT 60 MG capsule Take 60 mg by mouth 2 (two)  times daily.    . diazepam (VALIUM) 2 MG tablet Take 2 mg by mouth every 8 (eight) hours as needed. for anxiety  2  . fluconazole (DIFLUCAN) 150 MG tablet TAKE 1 TABLET (150 MG TOTAL) BY MOUTH ONCE FOR 1 DOSE. REPEAT IN 1 WEEK  0  . gabapentin (NEURONTIN) 300 MG capsule Take 300 mg by mouth 1 day or 1 dose.     . hydrochlorothiazide (HYDRODIURIL) 25 MG tablet Take 12.5 mg by mouth daily.     Marland Kitchen HYDROcodone-ibuprofen (VICOPROFEN) 7.5-200 MG tablet Take 1 tablet by mouth every 8 (eight) hours as needed for moderate pain.    Marland Kitchen LINZESS 290 MCG CAPS capsule Take 1 capsule (290 mcg total) by mouth daily. 30 capsule   . meclizine (ANTIVERT) 12.5 MG tablet Take 12.5 mg by mouth 3 (three) times daily as needed for dizziness.    . meloxicam (MOBIC) 15 MG tablet TAKE 1 TABLET (15 MG TOTAL) BY MOUTH ONCE DAILY. FOR PAIN/INFLAMMATION  11  . Multiple Vitamin (MULTIVITAMIN WITH MINERALS) TABS tablet Take 1 tablet by mouth daily.    . Multiple Vitamins-Minerals (HAIR/SKIN/NAILS) TABS Take 1 tablet by mouth daily.    Marland Kitchen POTASSIUM ACETATE IV Take by mouth.    . predniSONE (STERAPRED UNI-PAK 21 TAB) 10 MG (21) TBPK tablet Take 10-60 mg  by mouth See admin instructions. Take 6 tablets for 2 days then decrease by 1 pill every 2 days for 12 days    . sertraline (ZOLOFT) 100 MG tablet Take 50-100 mg by mouth at bedtime.  3  . SUMAtriptan (IMITREX) 100 MG tablet Take by mouth.     . valACYclovir (VALTREX) 500 MG tablet Take 500 mg by mouth 2 (two) times daily.     No current facility-administered medications for this visit.       REVIEW OF SYSTEMS (Negative unless checked)  Constitutional: '[]' Weight loss  '[]' Fever  '[]' Chills Cardiac: '[]' Chest pain   '[]' Chest pressure   '[]' Palpitations   '[]' Shortness of breath when laying flat   '[]' Shortness of breath at rest   '[]' Shortness of breath with exertion. Vascular:  '[]' Pain in legs with walking   '[]' Pain in legs at rest   '[]' Pain in legs when laying flat   '[]' Claudication   '[]' Pain in  feet when walking  '[]' Pain in feet at rest  '[]' Pain in feet when laying flat   '[]' History of DVT   '[x]' Phlebitis   '[]' Swelling in legs   '[]' Varicose veins   '[]' Non-healing ulcers Pulmonary:   '[]' Uses home oxygen   '[]' Productive cough   '[]' Hemoptysis   '[]' Wheeze  '[]' COPD   '[]' Asthma Neurologic:  '[]' Dizziness  '[]' Blackouts   '[]' Seizures   '[]' History of stroke   '[]' History of TIA  '[]' Aphasia   '[]' Temporary blindness   '[]' Dysphagia   '[x]' Weakness or numbness in arms   '[]' Weakness or numbness in legs Musculoskeletal:  '[]' Arthritis   '[]' Joint swelling   '[]' Joint pain   '[]' Low back pain Hematologic:  '[]' Easy bruising  '[]' Easy bleeding   '[]' Hypercoagulable state   '[]' Anemic  '[]' Hepatitis Gastrointestinal:  '[x]' Blood in stool   '[]' Vomiting blood  '[]' Gastroesophageal reflux/heartburn   '[]' Abdominal pain Genitourinary:  '[]' Chronic kidney disease   '[]' Difficult urination  '[]' Frequent urination  '[]' Burning with urination   '[]' Hematuria Skin:  '[]' Rashes   '[]' Ulcers   '[]' Wounds Psychological:  '[]' History of anxiety   '[]'  History of major depression.    Physical Exam BP 135/83 (BP Location: Right Arm)   Pulse (!) 58   Resp 16   Ht '5\' 2"'  (1.575 m)   Wt 74.4 kg (164 lb)   BMI 30.00 kg/m  Gen:  WD/WN, NAD Head: [place]/AT, No temporalis wasting.  Ear/Nose/Throat: Hearing grossly intact, nares w/o erythema or drainage, oropharynx w/o Erythema/Exudate Eyes: Conjunctiva clear, sclera non-icteric  Neck: trachea midline.  No JVD.  Pulmonary:  Good air movement, no use of accessory muscles, respirations not labored Cardiac: RRR Vascular: Firm cord is present at the antecubital fossa and in the forearm with what likely correlates with the cephalic vein or cephalic vein branch. No significant erythema. Mildly tender. No significant arm swelling is appreciable Vessel Right Left  Radial Palpable Palpable                                   Gastrointestinal: soft, non-tender/non-distended. Musculoskeletal: M/S 5/5 throughout.  Extremities without ischemic  changes.  No deformity or atrophy. no edema. Neurologic: Sensation grossly intact in extremities.  Symmetrical.  Speech is fluent. Motor exam as listed above. Psychiatric: Judgment intact, Mood & affect appropriate for pt's clinical situation. Dermatologic: No rashes or ulcers noted.  No cellulitis or open wounds.    Radiology Ct Abdomen Pelvis W Contrast  Result Date: 12/16/2016 CLINICAL DATA:  Pt states that she has a colonoscopy 12/14/2016,  had a polyp removed, Yesterday about 10:00pm at work started n/v and became diaphoretic after bloody BM. EXAM: CT ABDOMEN AND PELVIS WITH CONTRAST TECHNIQUE: Multidetector CT imaging of the abdomen and pelvis was performed using the standard protocol following bolus administration of intravenous contrast. CONTRAST:  168m ISOVUE-300 IOPAMIDOL (ISOVUE-300) INJECTION 61% COMPARISON:  None. FINDINGS: Lower chest: Clear lung bases.  Heart normal in size. Hepatobiliary: Fatty infiltration of the liver. No liver mass. Small area of focal fatty infiltration adjacent to the falciform ligament. Gallbladder surgically absent. No bile duct dilation. Pancreas: Unremarkable. No pancreatic ductal dilatation or surrounding inflammatory changes. Spleen: Normal in size without focal abnormality. Adrenals/Urinary Tract: Adrenal glands are unremarkable. Kidneys are normal, without renal calculi, focal lesion, or hydronephrosis. Bladder is unremarkable. Stomach/Bowel: No colonic wall thickening, inflammation or evidence of perforation. Stomach and small bowel are unremarkable. No free air. Vascular/Lymphatic: No significant vascular findings are present. No enlarged abdominal or pelvic lymph nodes. Reproductive: Status post hysterectomy. No adnexal masses. Other: No abdominal wall hernia or abnormality. No abdominopelvic ascites. Musculoskeletal: No fracture or acute finding. No osteoblastic or osteolytic lesions. Significant degenerative changes are noted at L5-S1. IMPRESSION: 1. No  acute findings. No evidence of a complication following colonoscopy. Specifically no evidence of bowel injury or perforation. 2. Hepatic steatosis. 3. Status post cholecystectomy and hysterectomy. Electronically Signed   By: DLajean ManesM.D.   On: 12/16/2016 08:24    Labs Recent Results (from the past 2160 hour(s))  Lipase, blood     Status: None   Collection Time: 12/15/16 10:35 PM  Result Value Ref Range   Lipase 33 11 - 51 U/L  Comprehensive metabolic panel     Status: Abnormal   Collection Time: 12/15/16 10:35 PM  Result Value Ref Range   Sodium 137 135 - 145 mmol/L   Potassium 3.7 3.5 - 5.1 mmol/L   Chloride 103 101 - 111 mmol/L   CO2 22 22 - 32 mmol/L   Glucose, Bld 143 (H) 65 - 99 mg/dL   BUN 15 6 - 20 mg/dL   Creatinine, Ser 1.02 (H) 0.44 - 1.00 mg/dL   Calcium 9.0 8.9 - 10.3 mg/dL   Total Protein 6.5 6.5 - 8.1 g/dL   Albumin 3.9 3.5 - 5.0 g/dL   AST 18 15 - 41 U/L   ALT 19 14 - 54 U/L   Alkaline Phosphatase 69 38 - 126 U/L   Total Bilirubin 0.6 0.3 - 1.2 mg/dL   GFR calc non Af Amer >60 >60 mL/min   GFR calc Af Amer >60 >60 mL/min    Comment: (NOTE) The eGFR has been calculated using the CKD EPI equation. This calculation has not been validated in all clinical situations. eGFR's persistently <60 mL/min signify possible Chronic Kidney Disease.    Anion gap 12 5 - 15  CBC     Status: Abnormal   Collection Time: 12/15/16 10:35 PM  Result Value Ref Range   WBC 11.2 (H) 4.0 - 10.5 K/uL   RBC 4.46 3.87 - 5.11 MIL/uL   Hemoglobin 13.5 12.0 - 15.0 g/dL   HCT 40.0 36.0 - 46.0 %   MCV 89.7 78.0 - 100.0 fL   MCH 30.3 26.0 - 34.0 pg   MCHC 33.8 30.0 - 36.0 g/dL   RDW 12.9 11.5 - 15.5 %   Platelets 338 150 - 400 K/uL  Urinalysis, Routine w reflex microscopic     Status: Abnormal   Collection Time: 12/15/16 10:37 PM  Result Value  Ref Range   Color, Urine AMBER (A) YELLOW    Comment: BIOCHEMICALS MAY BE AFFECTED BY COLOR   APPearance CLOUDY (A) CLEAR   Specific  Gravity, Urine 1.026 1.005 - 1.030   pH 5.0 5.0 - 8.0   Glucose, UA NEGATIVE NEGATIVE mg/dL   Hgb urine dipstick NEGATIVE NEGATIVE   Bilirubin Urine NEGATIVE NEGATIVE   Ketones, ur 5 (A) NEGATIVE mg/dL   Protein, ur 30 (A) NEGATIVE mg/dL   Nitrite NEGATIVE NEGATIVE   Leukocytes, UA NEGATIVE NEGATIVE   RBC / HPF 0-5 0 - 5 RBC/hpf   WBC, UA 0-5 0 - 5 WBC/hpf   Bacteria, UA RARE (A) NONE SEEN   Squamous Epithelial / LPF 6-30 (A) NONE SEEN   Mucous PRESENT    Hyaline Casts, UA PRESENT    Ca Oxalate Crys, UA PRESENT   CBC     Status: Abnormal   Collection Time: 12/16/16  9:04 AM  Result Value Ref Range   WBC 10.0 4.0 - 10.5 K/uL   RBC 3.86 (L) 3.87 - 5.11 MIL/uL   Hemoglobin 11.6 (L) 12.0 - 15.0 g/dL   HCT 34.5 (L) 36.0 - 46.0 %   MCV 89.4 78.0 - 100.0 fL   MCH 30.1 26.0 - 34.0 pg   MCHC 33.6 30.0 - 36.0 g/dL   RDW 13.4 11.5 - 15.5 %   Platelets 233 150 - 400 K/uL  Type and screen [place]     Status: None   Collection Time: 12/16/16  9:11 AM  Result Value Ref Range   ABO/RH(D) A POS    Antibody Screen NEG    Sample Expiration 12/19/2016   ABO/Rh     Status: None   Collection Time: 12/16/16  9:11 AM  Result Value Ref Range   ABO/RH(D) A POS   HIV antibody (Routine Testing)     Status: None   Collection Time: 12/16/16  5:42 PM  Result Value Ref Range   HIV Screen 4th Generation wRfx Non Reactive Non Reactive    Comment: (NOTE) Performed At: [facility] [place], [place] [id number] Lindon Romp MD [id number]   CBC     Status: Abnormal   Collection Time: 12/16/16  5:42 PM  Result Value Ref Range   WBC 9.9 4.0 - 10.5 K/uL   RBC 3.62 (L) 3.87 - 5.11 MIL/uL   Hemoglobin 11.0 (L) 12.0 - 15.0 g/dL   HCT 32.5 (L) 36.0 - 46.0 %   MCV 89.8 78.0 - 100.0 fL   MCH 30.4 26.0 - 34.0 pg   MCHC 33.8 30.0 - 36.0 g/dL   RDW 13.1 11.5 - 15.5 %   Platelets 222 150 - 400 K/uL    Assessment/Plan:  Hypertension blood pressure control  important in reducing the progression of atherosclerotic disease. On appropriate oral medications.   Superficial thrombophlebitis The patient has superficial thrombophlebitis of the left arm after an IV and blood draw. This is symptomatic. There is no evidence of DVT clinically and it is improving. I have recommended an aspirin 81 mg daily. I recommended warm compresses and elevating her arm. I think she can stay out of work until next week and return to work on 01/19/2017. At that point, she should have no restrictions. I will see her back as needed.      Leotis Pain 01/12/2017, 11:43 AM   This note was created with Dragon medical transcription system.  Any errors from dictation are unintentional.

## 2017-01-12 NOTE — Assessment & Plan Note (Signed)
The patient has superficial thrombophlebitis of the left arm after an IV and blood draw. This is symptomatic. There is no evidence of DVT clinically and it is improving. I have recommended an aspirin 81 mg daily. I recommended warm compresses and elevating her arm. I think she can stay out of work until next week and return to work on 01/19/2017. At that point, she should have no restrictions. I will see her back as needed.

## 2017-02-01 LAB — HM MAMMOGRAPHY

## 2017-07-06 ENCOUNTER — Ambulatory Visit (INDEPENDENT_AMBULATORY_CARE_PROVIDER_SITE_OTHER): Payer: 59 | Admitting: Internal Medicine

## 2017-07-06 ENCOUNTER — Encounter: Payer: Self-pay | Admitting: Internal Medicine

## 2017-07-06 VITALS — BP 126/74 | HR 77 | Temp 98.0°F | Ht 62.0 in | Wt 174.4 lb

## 2017-07-06 DIAGNOSIS — J309 Allergic rhinitis, unspecified: Secondary | ICD-10-CM

## 2017-07-06 DIAGNOSIS — H01119 Allergic dermatitis of unspecified eye, unspecified eyelid: Secondary | ICD-10-CM | POA: Diagnosis not present

## 2017-07-06 DIAGNOSIS — I10 Essential (primary) hypertension: Secondary | ICD-10-CM | POA: Diagnosis not present

## 2017-07-06 DIAGNOSIS — K635 Polyp of colon: Secondary | ICD-10-CM | POA: Diagnosis not present

## 2017-07-06 DIAGNOSIS — K760 Fatty (change of) liver, not elsewhere classified: Secondary | ICD-10-CM | POA: Diagnosis not present

## 2017-07-06 DIAGNOSIS — G43909 Migraine, unspecified, not intractable, without status migrainosus: Secondary | ICD-10-CM

## 2017-07-06 DIAGNOSIS — M797 Fibromyalgia: Secondary | ICD-10-CM

## 2017-07-06 DIAGNOSIS — K581 Irritable bowel syndrome with constipation: Secondary | ICD-10-CM

## 2017-07-06 DIAGNOSIS — J329 Chronic sinusitis, unspecified: Secondary | ICD-10-CM | POA: Diagnosis not present

## 2017-07-06 DIAGNOSIS — B009 Herpesviral infection, unspecified: Secondary | ICD-10-CM | POA: Diagnosis not present

## 2017-07-06 DIAGNOSIS — K219 Gastro-esophageal reflux disease without esophagitis: Secondary | ICD-10-CM

## 2017-07-06 MED ORDER — SUMATRIPTAN SUCCINATE 100 MG PO TABS: ORAL_TABLET | ORAL | 2 refills | Status: DC

## 2017-07-06 MED ORDER — CYCLOBENZAPRINE HCL 10 MG PO TABS: 10.0000 mg | ORAL_TABLET | Freq: Every day | ORAL | 1 refills | Status: DC | PRN

## 2017-07-06 MED ORDER — MONTELUKAST SODIUM 10 MG PO TABS: 10.0000 mg | ORAL_TABLET | Freq: Every day | ORAL | 0 refills | Status: DC

## 2017-07-06 MED ORDER — ACYCLOVIR 5 % EX OINT: 1.0000 "application " | TOPICAL_OINTMENT | CUTANEOUS | 0 refills | Status: DC

## 2017-07-06 MED ORDER — MECLIZINE HCL 12.5 MG PO TABS: 12.5000 mg | ORAL_TABLET | Freq: Three times a day (TID) | ORAL | 2 refills | Status: DC | PRN

## 2017-07-06 MED ORDER — LINZESS 290 MCG PO CAPS: 290.0000 ug | ORAL_CAPSULE | Freq: Every day | ORAL | 3 refills | Status: DC

## 2017-07-06 MED ORDER — PROMETHAZINE HCL 25 MG PO TABS: 25.0000 mg | ORAL_TABLET | Freq: Three times a day (TID) | ORAL | 2 refills | Status: DC | PRN

## 2017-07-06 MED ORDER — DIAZEPAM 2 MG PO TABS: 2.0000 mg | ORAL_TABLET | Freq: Every day | ORAL | 2 refills | Status: DC | PRN

## 2017-07-06 MED ORDER — OLOPATADINE HCL 0.1 % OP SOLN: 1.0000 [drp] | Freq: Two times a day (BID) | OPHTHALMIC | 12 refills | Status: DC

## 2017-07-06 MED ORDER — DEXILANT 60 MG PO CPDR: 60.0000 mg | DELAYED_RELEASE_CAPSULE | Freq: Every day | ORAL | 0 refills | Status: DC

## 2017-07-06 MED ORDER — TRIAMTERENE-HCTZ 37.5-25 MG PO TABS: 1.0000 | ORAL_TABLET | Freq: Every day | ORAL | 0 refills | Status: DC

## 2017-07-06 MED ORDER — SUCRALFATE 1 G PO TABS: 1.0000 g | ORAL_TABLET | Freq: Three times a day (TID) | ORAL | 1 refills | Status: DC

## 2017-07-06 MED ORDER — VALACYCLOVIR HCL 500 MG PO TABS: 500.0000 mg | ORAL_TABLET | Freq: Two times a day (BID) | ORAL | 1 refills | Status: DC

## 2017-07-06 NOTE — Progress Notes (Signed)
Pre visit review using our clinic review tool, if applicable. No additional management support is needed unless otherwise documented below in the visit note. 

## 2017-07-06 NOTE — Patient Instructions (Addendum)
Follow up in 4-6 weeks   Gastroesophageal Reflux Disease, Adult Normally, food travels down the esophagus and stays in the stomach to be digested. If a person has gastroesophageal reflux disease (GERD), food and stomach acid move back up into the esophagus. When this happens, the esophagus becomes sore and swollen (inflamed). Over time, GERD can make small holes (ulcers) in the lining of the esophagus. Follow these instructions at home: Diet  Follow a diet as told by your doctor. You may need to avoid foods and drinks such as: ? Coffee and tea (with or without caffeine). ? Drinks that contain alcohol. ? Energy drinks and sports drinks. ? Carbonated drinks or sodas. ? Chocolate and cocoa. ? Peppermint and mint flavorings. ? Garlic and onions. ? Horseradish. ? Spicy and acidic foods, such as peppers, chili powder, curry powder, vinegar, hot sauces, and BBQ sauce. ? Citrus fruit juices and citrus fruits, such as oranges, lemons, and limes. ? Tomato-based foods, such as red sauce, chili, salsa, and pizza with red sauce. ? Fried and fatty foods, such as donuts, french fries, potato chips, and high-fat dressings. ? High-fat meats, such as hot dogs, rib eye steak, sausage, ham, and bacon. ? High-fat dairy items, such as whole milk, butter, and cream cheese.  Eat small meals often. Avoid eating large meals.  Avoid drinking large amounts of liquid with your meals.  Avoid eating meals during the 2-3 hours before bedtime.  Avoid lying down right after you eat.  Do not exercise right after you eat. General instructions  Pay attention to any changes in your symptoms.  Take over-the-counter and prescription medicines only as told by your doctor. Do not take aspirin, ibuprofen, or other NSAIDs unless your doctor says it is okay.  Do not use any tobacco products, including cigarettes, chewing tobacco, and e-cigarettes. If you need help quitting, ask your doctor.  Wear loose clothes. Do not  wear anything tight around your waist.  Raise (elevate) the head of your bed about 6 inches (15 cm).  Try to lower your stress. If you need help doing this, ask your doctor.  If you are overweight, lose an amount of weight that is healthy for you. Ask your doctor about a safe weight loss goal.  Keep all follow-up visits as told by your doctor. This is important. Contact a doctor if:  You have new symptoms.  You lose weight and you do not know why it is happening.  You have trouble swallowing, or it hurts to swallow.  You have wheezing or a cough that keeps happening.  Your symptoms do not get better with treatment.  You have a hoarse voice. Get help right away if:  You have pain in your arms, neck, jaw, teeth, or back.  You feel sweaty, dizzy, or light-headed.  You have chest pain or shortness of breath.  You throw up (vomit) and your throw up looks like blood or coffee grounds.  You pass out (faint).  Your poop (stool) is bloody or black.  You cannot swallow, drink, or eat. This information is not intended to replace advice given to you by your health care provider. Make sure you discuss any questions you have with your health care provider. Document Released: 11/08/2007 Document Revised: 10/28/2015 Document Reviewed: 09/16/2014 Elsevier Interactive Patient Education  [employer].

## 2017-07-09 ENCOUNTER — Encounter: Payer: Self-pay | Admitting: Internal Medicine

## 2017-07-09 DIAGNOSIS — H01119 Allergic dermatitis of unspecified eye, unspecified eyelid: Secondary | ICD-10-CM | POA: Insufficient documentation

## 2017-07-09 DIAGNOSIS — J309 Allergic rhinitis, unspecified: Secondary | ICD-10-CM | POA: Insufficient documentation

## 2017-07-09 DIAGNOSIS — K219 Gastro-esophageal reflux disease without esophagitis: Secondary | ICD-10-CM | POA: Insufficient documentation

## 2017-07-09 DIAGNOSIS — K635 Polyp of colon: Secondary | ICD-10-CM | POA: Insufficient documentation

## 2017-07-09 DIAGNOSIS — M797 Fibromyalgia: Secondary | ICD-10-CM | POA: Insufficient documentation

## 2017-07-09 DIAGNOSIS — K760 Fatty (change of) liver, not elsewhere classified: Secondary | ICD-10-CM | POA: Insufficient documentation

## 2017-07-09 DIAGNOSIS — K589 Irritable bowel syndrome without diarrhea: Secondary | ICD-10-CM | POA: Insufficient documentation

## 2017-07-09 DIAGNOSIS — J019 Acute sinusitis, unspecified: Secondary | ICD-10-CM | POA: Diagnosis not present

## 2017-07-09 DIAGNOSIS — J329 Chronic sinusitis, unspecified: Secondary | ICD-10-CM | POA: Insufficient documentation

## 2017-07-09 NOTE — Progress Notes (Addendum)
Chief Complaint  Patient presents with  . Establish Care   New patient establish  1. C/o weight gain goal is 150 but weighing 174.4. She reports to doing gluten diet and she is lactose intolerant  2. HTN needs refills on medications today Triamterine-hctz 37.5-25 mg qd controlled  3. C/o chronic sinus issues and allergies with watery eyes and eyelids red. She went to [facility] and was given tobramycin then E-mycin oint for eyes but after use of E-mycin unable to see  4. Fibromyalgia she does not want to take medications for this b/c medications like lyrica/gabapentin, antidepressants cause wt gain. She has bottle of hydrocodone 7.5-300 mg q4-6 hrs prn with pills in bottle but I advised I do not fill chronic narcotics. She would like refill of Flexeril previously Rx 10 mg tid but she is not taken as Rx. 5. She c/o epigastric ab pain and constipation intermittently and GERD uncontrolled. She has been doing dexilant 60 mg bid x 2 years at least and reports this is the only medication that controls sx's. She has tried gluten free diet and is lactose intolerant and still has problems at times.   6. She needs Rx refills on all medications      Review of Systems  Constitutional: Negative for weight loss.  HENT: Positive for sinus pain. Negative for hearing loss.   Eyes:       No vision changes +watery eyes   Respiratory: Negative for shortness of breath.   Cardiovascular: Negative for chest pain.  Gastrointestinal: Positive for abdominal pain and heartburn.       H/o blood in stool s/p colonoscopy polyp removal 2018   Musculoskeletal: Positive for myalgias.  Skin:       +red itchy eyelids   Neurological: Negative for headaches.  Psychiatric/Behavioral: Negative for memory loss.   Past Medical History:  Diagnosis Date  . Fatty liver   . Fibromyalgia   . Hemorrhoids   . Hot flashes   . Hypertension   . IBS (irritable bowel syndrome)   . Migraines    Past Surgical History:   Procedure Laterality Date  . ABDOMINAL HYSTERECTOMY     2012 removed cervix h/o abnormal pap   . CHOLECYSTECTOMY     2000  . COLONOSCOPY     2018 with + polpys and GIB 2/2 polyp removal   . ELBOW DEBRIDEMENT    . FOOT SURGERY     Family History  Problem Relation Age of Onset  . Hypertension Mother   . Hyperlipidemia Mother   . Glaucoma Mother   . Diabetes Mother   . COPD Father   . Colon cancer Sister   . Colon polyps Sister    Social History   Socioeconomic History  . Marital status: Married    Spouse name: Not on file  . Number of children: Not on file  . Years of education: Not on file  . Highest education level: Not on file  Social Needs  . Financial resource strain: Not on file  . Food insecurity - worry: Not on file  . Food insecurity - inability: Not on file  . Transportation needs - medical: Not on file  . Transportation needs - non-medical: Not on file  Occupational History  . Not on file  Tobacco Use  . Smoking status: Never Smoker  . Smokeless tobacco: Never Used  Substance and Sexual Activity  . Alcohol use: No  . Drug use: Not on file  . Sexual activity: Not  on file  Other Topics Concern  . Not on file  Social History Narrative  . Not on file   Current Meds  Medication Sig  . DEXILANT 60 MG capsule Take 1 capsule (60 mg total) by mouth daily. 30 minutes before food  . diazepam (VALIUM) 2 MG tablet Take 1 tablet (2 mg total) by mouth daily as needed. for anxiety  . HYDROcodone-ibuprofen (VICOPROFEN) 7.5-200 MG tablet Take 1 tablet by mouth every 8 (eight) hours as needed for moderate pain.  Marland Kitchen LINZESS 290 MCG CAPS capsule Take 1 capsule (290 mcg total) by mouth daily.  . meclizine (ANTIVERT) 12.5 MG tablet Take 1 tablet (12.5 mg total) by mouth 3 (three) times daily as needed for dizziness.  . Multiple Vitamin (MULTIVITAMIN WITH MINERALS) TABS tablet Take 1 tablet by mouth daily.  . promethazine (PHENERGAN) 25 MG tablet Take 1 tablet (25 mg total)  by mouth every 8 (eight) hours as needed for nausea or vomiting.  . SUMAtriptan (IMITREX) 100 MG tablet For ha take 1 pill may repeat another in 2 hrs as needed. Do not take more than 2 pills in 1 week  . triamterene-hydrochlorothiazide (MAXZIDE-25) 37.5-25 MG tablet Take 1 tablet by mouth daily.  . valACYclovir (VALTREX) 500 MG tablet Take 1 tablet (500 mg total) by mouth 2 (two) times daily. X 1 week prn  . [DISCONTINUED] DEXILANT 60 MG capsule Take 60 mg by mouth 2 (two) times daily.  . [DISCONTINUED] diazepam (VALIUM) 2 MG tablet Take 2 mg by mouth every 8 (eight) hours as needed. for anxiety  . [DISCONTINUED] LINZESS 290 MCG CAPS capsule Take 1 capsule (290 mcg total) by mouth daily.  . [DISCONTINUED] meclizine (ANTIVERT) 12.5 MG tablet Take 12.5 mg by mouth 3 (three) times daily as needed for dizziness.  . [DISCONTINUED] promethazine (PHENERGAN) 25 MG tablet Take by mouth.  . [DISCONTINUED] SUMAtriptan (IMITREX) 100 MG tablet Take by mouth.   . [DISCONTINUED] triamterene-hydrochlorothiazide (MAXZIDE-25) 37.5-25 MG tablet   . [DISCONTINUED] valACYclovir (VALTREX) 500 MG tablet Take 500 mg by mouth 2 (two) times daily.   Allergies  Allergen Reactions  . Ranitidine Diarrhea and Other (See Comments)    Diarrhea and stomach cramping  . Amphetamine-Dextroamphetamine Other (See Comments)    Elevated BP  . Reglan [Metoclopramide] Other (See Comments)    Increased prolactin level   . Sulfa Antibiotics Anxiety   No results found for this or any previous visit (from the past 2160 hour(s)). Objective  Body mass index is 31.9 kg/m. Wt Readings from Last 3 Encounters:  07/06/17 174 lb 6.4 oz (79.1 kg)  01/12/17 164 lb (74.4 kg)  12/16/16 160 lb 3.2 oz (72.7 kg)   Temp Readings from Last 3 Encounters:  07/06/17 98 F (36.7 C) (Oral)  12/16/16 98.2 F (36.8 C) (Oral)   BP Readings from Last 3 Encounters:  07/06/17 126/74  01/12/17 135/83  12/16/16 118/64   Pulse Readings from Last 3  Encounters:  07/06/17 77  01/12/17 (!) 58  12/16/16 63   O2 sat room air 97%  Physical Exam  Constitutional: She is oriented to person, place, and time and well-developed, well-nourished, and in no distress. Vital signs are normal.  HENT:  Head: Normocephalic and atraumatic.  Mouth/Throat: Oropharynx is clear and moist and mucous membranes are normal.  Eyes: Conjunctivae are normal. Pupils are equal, round, and reactive to light.  Eyelids b/l red and sl swollen   Cardiovascular: Normal rate, regular rhythm and normal heart sounds.  Trace leg edema b/l   Pulmonary/Chest: Effort normal and breath sounds normal.  Abdominal: Soft. Bowel sounds are normal. There is no tenderness.  Neurological: She is alert and oriented to person, place, and time. Gait normal. Gait normal.  Skin: Skin is warm and dry. Rash noted.  Psychiatric: Mood, memory, affect and judgment normal.  Nursing note and vitals reviewed.   Assessment   1. HTN controlled  2. Allergic rhinitis/chronic sinusitis  3. Epigastric pain and uncontrolled GERD, IBS-constipation  4. Fibromyalgia  5. Migraines hx  6. H/o HSV  7. Eyelid dermatitis  8. HM  Plan   1. Refilled Maxzide 37.5-25 qd  2. Add singulair, padaday eye drops  Of note pts husband had appt 07/11/17 and husband mentioned pt had to go the urgent care to get antibiotics b/c no relief  Spoke with pt 07/11/17 and she wants to see another provider due to not being given antibiotics on initial visit and did not feel like provider heard her complaints about these sx's. She reports sx's x 1-2 weeks and tried nasal sprays w/o relief and waited 4 hrs in urgent care 07/09/17. So wants to find another provider due to this reason advised she can see another provider in this office   3.  Reduce dexilant from 60 mg bid to 60 mg qd  Add carafate pt does not want to take b/c feeling nauseated with this after speaking to her 07/11/17  Consider check H pylori in future  Cont linzess for  constipation  4. Pt does not want to be on lyrica/gabapentin or antidepressants due to side effects wt gain  I disc'ed I do not do chronic narcotics  Refilled Flexeril 10 mg qd prn as pt stated she was not taking tid prn freq 5. Refilled chronic meds for h/a imitrex, phenerghan  6. Refilled valtrex takes bid prn x 1 week when needed  7. Cold compress, hc 1% bid otc  Trial of pataday eyedrops  8.   Flu shot had 10/208  Tdap had 03/2017 Never had pneumonia or shingles vaccines dics shingrix today  Consider hep A/B in future with h/o fatty liver   BMI 31.9 obesity she reports she is walking up to 10K steps qd goal wt 150   Last pap 2012 s/p hysterectomy with h/o abnormal pap consider repeat pap in future  Colonoscopy h/o colon polpy 12/14/16 cecal polyp sessile serrated adenoma negative pathology  Mammogram 02/01/17 negative   HIV neg 12/16/16   Repeat labs at f/u in 4-6 weeks CMET had 03/29/17 nl 05/17/17 Cr 1.1 check BMET at f/u, CBC nl 05/17/17, UA nl 09/18/16, lipid had 05/17/17 care everywhere), TSH/T4 (had 05/17/17 normal), A1C, consider check hep B/C Vitamin D nl 41 07/18/16   Assess smoking hx at f/u she checked smoked in the past on intake form Does not own guns, feels safe in relationship  GI Dr. Theophilus Kinds  [facility]  Dentist Dr. Nyra Capes  Last PCP Dr. Vickki Muff 05/2017 [facility] see care everywhere   Provider: Dr. Olivia Mackie McLean-Scocuz[facility]

## 2017-07-30 ENCOUNTER — Telehealth: Payer: Self-pay

## 2017-07-30 NOTE — Telephone Encounter (Signed)
Copied from [place] [id number]. Topic: Appointment Scheduling - Scheduling Inquiry for Clinic >> Jul 30, 2017  4:12 PM Arletha Grippe wrote: Reason for CRM: pt is wanting to change from Dr Aundra Dubin to Allie Bossier at [facility].  Pt was not happy with Dr Aundra Dubin and would like a provider with listening skills.  Please contact pt at  [phone] to set up.

## 2017-07-30 NOTE — Telephone Encounter (Signed)
Patient would like to switch care to [facility] with Allie Bossier PA. Is this ok?   Dr.Mclean please send to Allie Bossier, PA if you approve.

## 2017-07-30 NOTE — Telephone Encounter (Signed)
Copied from [place] [id number]. Topic: General - Call Back - No Documentation >> Jul 30, 2017  4:16 PM Arletha Grippe wrote: Reason for CRM:  Pt is returning call - please call back at [phone]  No crm

## 2017-07-30 NOTE — Telephone Encounter (Signed)
Ok thanks 

## 2017-08-08 DIAGNOSIS — J019 Acute sinusitis, unspecified: Secondary | ICD-10-CM | POA: Insufficient documentation

## 2017-08-08 DIAGNOSIS — Z87898 Personal history of other specified conditions: Secondary | ICD-10-CM | POA: Insufficient documentation

## 2017-08-08 DIAGNOSIS — Z9049 Acquired absence of other specified parts of digestive tract: Secondary | ICD-10-CM | POA: Insufficient documentation

## 2017-08-08 DIAGNOSIS — Z90710 Acquired absence of both cervix and uterus: Secondary | ICD-10-CM | POA: Insufficient documentation

## 2017-08-08 DIAGNOSIS — H1045 Other chronic allergic conjunctivitis: Secondary | ICD-10-CM | POA: Insufficient documentation

## 2017-09-14 ENCOUNTER — Other Ambulatory Visit
Admission: RE | Admit: 2017-09-14 | Discharge: 2017-09-14 | Disposition: A | Discharge status: Home / Self Care | Payer: 59 | Source: Ambulatory Visit | Attending: Otolaryngology | Admitting: Otolaryngology

## 2017-09-14 DIAGNOSIS — J301 Allergic rhinitis due to pollen: Secondary | ICD-10-CM | POA: Insufficient documentation

## 2017-09-14 DIAGNOSIS — J342 Deviated nasal septum: Secondary | ICD-10-CM | POA: Diagnosis not present

## 2017-09-14 DIAGNOSIS — J3489 Other specified disorders of nose and nasal sinuses: Secondary | ICD-10-CM | POA: Diagnosis not present

## 2017-09-19 LAB — LATEX, IGE

## 2017-09-27 DIAGNOSIS — J301 Allergic rhinitis due to pollen: Secondary | ICD-10-CM | POA: Diagnosis not present

## 2017-09-28 ENCOUNTER — Encounter: Payer: Self-pay | Admitting: Family Medicine

## 2017-09-28 ENCOUNTER — Ambulatory Visit (INDEPENDENT_AMBULATORY_CARE_PROVIDER_SITE_OTHER): Payer: 59 | Admitting: Family Medicine

## 2017-09-28 ENCOUNTER — Ambulatory Visit
Admission: RE | Admit: 2017-09-28 | Discharge: 2017-09-28 | Disposition: A | Discharge status: Home / Self Care | Payer: 59 | Source: Ambulatory Visit | Attending: Family Medicine | Admitting: Family Medicine

## 2017-09-28 ENCOUNTER — Telehealth: Payer: Self-pay

## 2017-09-28 VITALS — BP 108/70 | HR 67 | Temp 98.2°F | Resp 16 | Ht 62.0 in | Wt 167.0 lb

## 2017-09-28 DIAGNOSIS — M25561 Pain in right knee: Secondary | ICD-10-CM

## 2017-09-28 DIAGNOSIS — F419 Anxiety disorder, unspecified: Secondary | ICD-10-CM | POA: Insufficient documentation

## 2017-09-28 DIAGNOSIS — M25461 Effusion, right knee: Secondary | ICD-10-CM | POA: Diagnosis not present

## 2017-09-28 DIAGNOSIS — G43909 Migraine, unspecified, not intractable, without status migrainosus: Secondary | ICD-10-CM

## 2017-09-28 DIAGNOSIS — M797 Fibromyalgia: Secondary | ICD-10-CM | POA: Diagnosis not present

## 2017-09-28 DIAGNOSIS — K581 Irritable bowel syndrome with constipation: Secondary | ICD-10-CM

## 2017-09-28 DIAGNOSIS — J309 Allergic rhinitis, unspecified: Secondary | ICD-10-CM | POA: Diagnosis not present

## 2017-09-28 DIAGNOSIS — K219 Gastro-esophageal reflux disease without esophagitis: Secondary | ICD-10-CM | POA: Diagnosis not present

## 2017-09-28 DIAGNOSIS — G8929 Other chronic pain: Secondary | ICD-10-CM | POA: Insufficient documentation

## 2017-09-28 DIAGNOSIS — I10 Essential (primary) hypertension: Secondary | ICD-10-CM | POA: Diagnosis not present

## 2017-09-28 DIAGNOSIS — M25562 Pain in left knee: Secondary | ICD-10-CM | POA: Diagnosis not present

## 2017-09-28 DIAGNOSIS — K760 Fatty (change of) liver, not elsewhere classified: Secondary | ICD-10-CM | POA: Diagnosis not present

## 2017-09-28 MED ORDER — DULOXETINE HCL 30 MG PO CPEP: ORAL_CAPSULE | ORAL | 0 refills | Status: DC

## 2017-09-28 NOTE — Assessment & Plan Note (Signed)
Chronic IBS with constipation Patient is stable on Linzess and Colace She is being followed routinely by gastroenterology, so new referral placed today Patient has also been getting regular colonoscopies due to her sister's history of colon cancer early in life

## 2017-09-28 NOTE — Assessment & Plan Note (Signed)
Chronic and uncontrolled Discussed with patient that her current dose of Dexilant is the highest dose possible Depending on when her last EGD was, she may benefit from repeat scope and biopsy given the severity of her symptoms despite high-dose Dexilant treatment Referral to GI as above for further evaluation and management

## 2017-09-28 NOTE — Assessment & Plan Note (Signed)
Well-controlled Continue Maxide at current dose Reviewed recent metabolic panel from [facility] chart Follow-up in 6 months

## 2017-09-28 NOTE — Patient Instructions (Signed)

## 2017-09-28 NOTE — Assessment & Plan Note (Signed)
Noted on CT abdomen last year Discussed with patient the importance of diet low in saturated fat and control of other parts of her metabolic syndrome

## 2017-09-28 NOTE — Telephone Encounter (Signed)
-----   Message from Virginia Crews, MD sent at 09/28/2017  2:34 PM EDT ----- Knee x-rays are read as normal.  On personal review of the x-ray images, there is some loss of cartilage space of the right medial/inside knee as well as under the patella.  Recommend symptomatic treatment with Tylenol, ice, exercises given.  Avoid sitting and positions that exacerbate the problem.  Virginia Crews, MD, MPH [facility] 09/28/2017 2:34 PM

## 2017-09-28 NOTE — Assessment & Plan Note (Signed)
Chronic and stable Discussed with patient that chronic opioid therapy is not appropriate for fibromyalgia treatment Discussed the importance of regular exercise Patient states she is tried and failed gabapentin and Lyrica in the past She agrees to try Cymbalta today which we will start at 30 mg daily and increase to 60 mg daily after 1 week My hope is that the Cymbalta would help not only her chronic pain but also her anxiety

## 2017-09-28 NOTE — Assessment & Plan Note (Signed)
Chronic and stable Discussed with patient that chronic benzo therapy is not appropriate for treatment of chronic anxiety Discussed with patient that I will not prescribe chronic benzos and we will try Cymbalta and then attempt to taper off her Valium Since she is only taking Valium twice weekly per report, she likely will not need a taper and we will be able to stop this

## 2017-09-28 NOTE — Assessment & Plan Note (Signed)
Right knee is stable without effusion Suspect some osteoarthritic changes that cause stiffness and pain with terminal flexion We will obtain x-rays to determine degree of cartilage loss Exercises given for strengthening She may benefit from trial of steroid injection in the future

## 2017-09-28 NOTE — Assessment & Plan Note (Signed)
Chronic and intermittent problem with low frequency Encourage patient to carry her sumatriptan hand around and be able to take this when she does experience a migraine Discussed with patient that opioids are not appropriate treatment for migraines and can cause rebound headaches Discussed that if she has a severe migraine and needs to go to sleep and abort migraine she can try Phenergan instead which she has been prescribed previously Due to the infrequent nature, no indication for prophylactic medication at this time

## 2017-09-28 NOTE — Assessment & Plan Note (Signed)
Chronic and stable Patient has seen ENT recently for chronic sinusitis and allergic rhinitis She reports she is undergoing allergy testing She should continue her Singulair, Xyzal, Nasacort

## 2017-09-28 NOTE — Progress Notes (Signed)
Patient: Elizabeth Bowers, Female    DOB: [birth year], 52 y.o.   MRN: [id number] Visit Date: 09/28/2017  Today's Provider: Lavon Paganini, MD   I, Martha Clan, CMA, am acting as scribe for Lavon Paganini, MD.  Chief Complaint  Patient presents with  . Establish Care   Subjective:    Annual physical exam Elizabeth Bowers is a 52 y.o. female who presents today to establish care. She feels fairly well. She reports exercising none outside of work. She reports she is sleeping poorly. Works night shift.  Pt is c/o worsening right knee pain. She is also experiencing joint swelling. This has been for present for several years, but has been worsening in the last 7 months.  She reports that the pain is worse with accompanying stiffness after sitting with her leg bent underneath her or after driving long distances.  She describes the pain as being anterior knee over the patella and quads tendon.  She has never had evaluation or x-rays of this knee.  Her PMH includes allergies, anxiety, GERD, HTN, fibromyalgia, degenerative OA of lumbar spine (diagnosed in [place]).   IBS-C, hemorrhoids, GERD: Taking linzess and stool softener daily and symptoms are somewhat better than they used to be.  She was previously seeing [facility], but her insurance has changed and she is looking for a new gastroenterologist.  Her husband sees Dr. Marius Ditch at [place] and she would like to be seen there.  She states that previously before taking these medications she once went a whole month without a bowel movement.  She reports very poor control of her GERD and that once on an endoscopy she was noted to have almost Barrett's esophagus.  She was previously taking Dexilant 60 mg twice daily long-term.  She became upset with her last PCP when she was told that this was much too high of a dose and only take it daily.  She states that she has tried Prilosec, Protonix, Nexium, Prevacid in the future without  improvement.  Migraines: Doesn't take triptan because she often doesn't have it with her.  She states that when she has a migraine she will take a dose of her Vicoprofen that does not help with the migraine, but does help her go to sleep and the migraine is gone by the time she wakes up.  Her migraines have been less frequent since the time of menopause.  She thinks her last migraine was 5 to 6 months ago.  She believes that the elimination of lactose and gluten from her diet have helped decrease her migraine frequency.  Allergic rhinitis: Long-standing and chronic issue.  Patient reports this is worse since moving to [place] from [place].  She states that her ENT did allergy testing yesterday.  She is taking Singulair, Xyzal, Rhinocort with fair control of symptoms.  She tried Flonase in the past and it gave her irritation of her nares.  HTN: - Medications: Maxide 25 - Compliance: Good - Checking BP at home: No - Denies any SOB, CP, vision changes, LE edema, medication SEs, or symptoms of hypotension - Diet: States that she eats what she wants, but believes this is healthy and there is no way to cut any fat from her diet as she only eats sweet potato chips and not regular potato chips - Exercise: States that she does not need to exercise because she is active at her job.  She states that each shift she walks greater than  10,000 steps.  Hot flashes continue s/p menopause.  She states that this had stopped for a little while and now has come back.  She tried Zoloft, estrogen, clonidine in the past.  She states "all deal with the hot flashes because I do not need to gain anymore weight."  Hepatic steatosis noted on CT abd pelvis in 12/2016.  Patient states that she has never been told that she has a fatty liver and does not believe this to be true.  She states there is no way to remove any more fats from her diet.  Patient states that she was diagnosed with fibromyalgia few years ago.   Since that time, she reports many "nerve injuries."  She has history of bilateral ulnar nerve debridements.  She was also seen by a neurosurgeon in [place] after she dropped a board on her foot at work and suffered nerve injury in her foot.  She states she was told that she is considered permanently disabled for the nerve in her foot because at any moment she could not be able to walk ever again. Patient has a prescription for Vicoprofen.  She insists that the last time she filled this was about 1 year ago.  She states that she takes this for her fibromyalgia intermittently and is adamant that she does not take it routinely.  She states that she tried gabapentin and Lyrica in the past and these both did not work and cause weight gain.  She takes Flexeril as needed.  She reports fibro-fatigue.  H/o abnormal pap smears.  She had a hysterectomy with cervix removed.  She then was followed by GYN and had 2 normal Pap smears postop.  She was told that she never needs to have a Pap smear again for this reason.  She got mad at her previous PCP for suggesting that she may need a vaginal Pap smear.  Recurrent cold sores.  Takes Valtrex as needed.  Taking valium about twice weekly for anxiety.  States that she tried BuSpar in the past and this did not help.  States that Valium is the only thing that will help her anxiety.  She has not taken in SSRI for her anxiety ever.   -----------------------------------------------------------------   Review of Systems  Constitutional: Positive for appetite change and fatigue. Negative for activity change, chills, diaphoresis, fever and unexpected weight change.  HENT: Positive for ear pain, postnasal drip, sinus pressure, sore throat and trouble swallowing. Negative for congestion, dental problem, drooling, ear discharge, facial swelling, hearing loss, mouth sores, nosebleeds, rhinorrhea, sinus pain, sneezing, tinnitus and voice change.   Eyes: Positive for discharge.  Negative for photophobia, pain, redness, itching and visual disturbance.  Respiratory: Negative.   Cardiovascular: Negative.   Gastrointestinal: Positive for constipation. Negative for abdominal distention, abdominal pain, anal bleeding, blood in stool, diarrhea, nausea, rectal pain and vomiting.  Endocrine: Positive for polyphagia. Negative for cold intolerance, heat intolerance, polydipsia and polyuria.  Genitourinary: Negative.   Musculoskeletal: Positive for back pain, myalgias and neck stiffness. Negative for arthralgias, gait problem, joint swelling and neck pain.  Skin: Negative.   Allergic/Immunologic: Positive for environmental allergies and food allergies. Negative for immunocompromised state.  Neurological: Positive for dizziness, light-headedness and headaches. Negative for tremors, seizures, syncope, facial asymmetry, speech difficulty, weakness and numbness.  Hematological: Negative.   Psychiatric/Behavioral: Negative for agitation, behavioral problems, confusion, decreased concentration, dysphoric mood, hallucinations, self-injury, sleep disturbance and suicidal ideas. The patient is nervous/anxious. The patient is not hyperactive.  Social History      She  reports that she quit smoking about 30 years ago. She has a 3.75 pack-year smoking history. She has never used smokeless tobacco. She reports that she does not drink alcohol or use drugs.       Social History   Socioeconomic History  . Marital status: Married    Spouse name: Kyung Rudd  . Number of children: 6  . Years of education: 16  . Highest education level: Associate degree: academic program  Occupational History    Employer: [employer]  Social Needs  . Financial resource strain: Not hard at all  . Food insecurity:    Worry: Never true    Inability: Never true  . Transportation needs:    Medical: No    Non-medical: No  Tobacco Use  . Smoking status: Former Smoker    Packs/day: 0.75    Years: 5.00    Pack  years: 3.75    Last attempt to quit: 07/06/1987    Years since quitting: 30.2  . Smokeless tobacco: Never Used  Substance and Sexual Activity  . Alcohol use: No  . Drug use: Never  . Sexual activity: Not on file  Lifestyle  . Physical activity:    Days per week: 0 days    Minutes per session: 0 min  . Stress: Not on file  Relationships  . Social connections:    Talks on phone: Not on file    Gets together: Not on file    Attends religious service: Not on file    Active member of club or organization: Not on file    Attends meetings of clubs or organizations: Not on file    Relationship status: Not on file  Other Topics Concern  . Not on file  Social History Narrative  . Not on file    Past Medical History:  Diagnosis Date  . Fatty liver   . Fibromyalgia   . Hemorrhoids   . Hot flashes   . Hypertension   . IBS (irritable bowel syndrome)   . Migraines   . Miscarriage    x 2; 4 live births      Patient Active Problem List   Diagnosis Date Noted  . Fatty liver 07/09/2017  . Colon polyps 07/09/2017  . Fibromyalgia 07/09/2017  . Sinusitis, chronic 07/09/2017  . Allergic rhinitis 07/09/2017  . IBS (irritable bowel syndrome) 07/09/2017  . GERD (gastroesophageal reflux disease) 07/09/2017  . Migraine 07/09/2017  . Eyelid dermatitis, allergic/contact 07/09/2017  . Hypertension 01/12/2017  . Superficial thrombophlebitis 01/12/2017  . Rectal bleeding 12/16/2016  . Abdominal pain 12/16/2016    Past Surgical History:  Procedure Laterality Date  . ABDOMINAL HYSTERECTOMY     2012 removed cervix h/o abnormal pap   . CHOLECYSTECTOMY     2000  . COLONOSCOPY     2018 with + polpys and GIB 2/2 polyp removal   . ELBOW DEBRIDEMENT    . FOOT SURGERY      Family History        Family Status  Relation Name Status  . Mother  Alive  . Father  Deceased  . Sister  Deceased  . Sister  Alive  . Brother  Alive  . Daughter  Alive  . Son [employer]  . MGM  (Not Specified)   . PGM  (Not Specified)  . PGF  (Not Specified)  . Sister  Alive  . Son  Alive  . Son  (Not Specified)  Her family history includes Alcohol abuse in her son; Arthritis in her son and son; Asthma in her sister; COPD in her father, paternal grandmother, and sister; Cancer in her paternal grandfather and sister; Colon cancer in her sister; Colon polyps in her sister; Depression in her son; Diabetes in her maternal grandmother, mother, and son; Drug abuse in her son; Glaucoma in her mother; Hyperlipidemia in her mother; Hypertension in her mother; Kidney disease in her sister; Mental illness in her sister and son; Miscarriages / [place] in her sister and sister.      Allergies  Allergen Reactions  . Ranitidine Diarrhea and Other (See Comments)    Diarrhea and stomach cramping  . Amphetamine-Dextroamphetamine Other (See Comments)    Elevated BP  . Reglan [Metoclopramide] Other (See Comments)    Increased prolactin level   . Sulfa Antibiotics Anxiety     Current Outpatient Medications:  .  acyclovir ointment (ZOVIRAX) 5 %, Apply 1 application topically every 3 (three) hours. Prn cold sore, Disp: 30 g, Rfl: 0 .  budesonide (RHINOCORT ALLERGY) 32 MCG/ACT nasal spray, Place into both nostrils daily., Disp: , Rfl:  .  cyclobenzaprine (FLEXERIL) 10 MG tablet, Take 1 tablet (10 mg total) by mouth daily as needed for muscle spasms., Disp: 30 tablet, Rfl: 1 .  DEXILANT 60 MG capsule, Take 1 capsule (60 mg total) by mouth daily. 30 minutes before food, Disp: 90 capsule, Rfl: 0 .  diazepam (VALIUM) 2 MG tablet, Take 1 tablet (2 mg total) by mouth daily as needed. for anxiety, Disp: 30 tablet, Rfl: 2 .  HYDROcodone-ibuprofen (VICOPROFEN) 7.5-200 MG tablet, Take 1 tablet by mouth every 8 (eight) hours as needed for moderate pain., Disp: , Rfl:  .  levocetirizine (XYZAL) 5 MG tablet, , Disp: , Rfl: 12 .  LINZESS 290 MCG CAPS capsule, Take 1 capsule (290 mcg total) by mouth daily., Disp: 90  capsule, Rfl: 3 .  meclizine (ANTIVERT) 12.5 MG tablet, Take 1 tablet (12.5 mg total) by mouth 3 (three) times daily as needed for dizziness., Disp: 30 tablet, Rfl: 2 .  montelukast (SINGULAIR) 10 MG tablet, Take 1 tablet (10 mg total) by mouth at bedtime., Disp: 90 tablet, Rfl: 0 .  Multiple Vitamin (MULTIVITAMIN WITH MINERALS) TABS tablet, Take 1 tablet by mouth daily., Disp: , Rfl:  .  promethazine (PHENERGAN) 25 MG tablet, Take 1 tablet (25 mg total) by mouth every 8 (eight) hours as needed for nausea or vomiting., Disp: 30 tablet, Rfl: 2 .  SUMAtriptan (IMITREX) 100 MG tablet, For ha take 1 pill may repeat another in 2 hrs as needed. Do not take more than 2 pills in 1 week, Disp: 10 tablet, Rfl: 2 .  triamterene-hydrochlorothiazide (MAXZIDE-25) 37.5-25 MG tablet, Take 1 tablet by mouth daily., Disp: 90 tablet, Rfl: 0 .  valACYclovir (VALTREX) 500 MG tablet, Take 1 tablet (500 mg total) by mouth 2 (two) times daily. X 1 week prn, Disp: 60 tablet, Rfl: 1   Patient Care Team: Virginia Crews, MD as PCP - General (Family Medicine)      Objective:   Vitals: BP 108/70 (BP Location: Left Arm, Patient Position: Sitting, Cuff Size: Normal)   Pulse 67   Temp 98.2 F (36.8 C) (Oral)   Resp 16   Ht 5\' 2"  (1.575 m)   Wt 167 lb (75.8 kg)   SpO2 99%   BMI 30.54 kg/m    Vitals:   09/28/17 0910  BP: 108/70  Pulse: 67  Resp: 16  Temp: 98.2 F (36.8 C)  TempSrc: Oral  SpO2: 99%  Weight: 167 lb (75.8 kg)  Height: 5\' 2"  (1.575 m)     Physical Exam  Constitutional: She is oriented to person, place, and time. She appears well-developed and well-nourished. No distress.  HENT:  Head: Normocephalic and atraumatic.  Right Ear: External ear normal.  Left Ear: External ear normal.  Nose: Nose normal.  Mouth/Throat: Oropharynx is clear and moist. No oropharyngeal exudate.  Eyes: Pupils are equal, round, and reactive to light. Conjunctivae and EOM are normal. No scleral icterus.  Neck:  Neck supple. No thyromegaly present.  Cardiovascular: Normal rate, regular rhythm, normal heart sounds and intact distal pulses.  No murmur heard. Pulmonary/Chest: Effort normal and breath sounds normal. No respiratory distress. She has no wheezes. She has no rales.  Abdominal: Soft. Bowel sounds are normal. She exhibits no distension. There is no tenderness. There is no rebound and no guarding.  Musculoskeletal: She exhibits no edema or deformity.  Right Knee: Normal to inspection with no erythema or effusion or obvious bony abnormalities.  Palpation normal with no warmth or joint line tenderness or patellar tenderness or condyle tenderness. ROM normal in flexion and extension and lower leg rotation. Ligaments with solid consistent endpoints including ACL, PCL, LCL, MCL. Negative Mcmurray's and provocative meniscal tests. Non painful patellar compression. Patellar and quadriceps tendons unremarkable. Hamstring and quadriceps strength is normal.  Lymphadenopathy:    She has no cervical adenopathy.  Neurological: She is alert and oriented to person, place, and time.  Skin: Skin is warm and dry. Capillary refill takes less than 2 seconds. No rash noted.  Psychiatric: She has a normal mood and affect. Her behavior is normal.  Vitals reviewed.    Depression Screen PHQ 2/9 Scores 09/28/2017 07/06/2017  PHQ - 2 Score 1 0     Assessment & Plan:    Problem List Items Addressed This Visit      Cardiovascular and Mediastinum   Hypertension    Well-controlled Continue Maxide at current dose Reviewed recent metabolic panel from [facility] chart Follow-up in 6 months      Migraine    Chronic and intermittent problem with low frequency Encourage patient to carry her sumatriptan hand around and be able to take this when she does experience a migraine Discussed with patient that opioids are not appropriate treatment for migraines and can cause rebound headaches Discussed that if she has a severe  migraine and needs to go to sleep and abort migraine she can try Phenergan instead which she has been prescribed previously Due to the infrequent nature, no indication for prophylactic medication at this time      Relevant Medications   DULoxetine (CYMBALTA) 30 MG capsule     Respiratory   Allergic rhinitis    Chronic and stable Patient has seen ENT recently for chronic sinusitis and allergic rhinitis She reports she is undergoing allergy testing She should continue her Singulair, Xyzal, Nasacort        Digestive   Fatty liver    Noted on CT abdomen last year Discussed with patient the importance of diet low in saturated fat and control of other parts of her metabolic syndrome      IBS (irritable bowel syndrome)    Chronic IBS with constipation Patient is stable on Linzess and Colace She is being followed routinely by gastroenterology, so new referral placed today Patient has also been getting regular colonoscopies due to her sister's history  of colon cancer early in life      Relevant Medications   docusate sodium (COLACE) 100 MG capsule   Other Relevant Orders   Ambulatory referral to Gastroenterology   GERD (gastroesophageal reflux disease)    Chronic and uncontrolled Discussed with patient that her current dose of Dexilant is the highest dose possible Depending on when her last EGD was, she may benefit from repeat scope and biopsy given the severity of her symptoms despite high-dose Dexilant treatment Referral to GI as above for further evaluation and management      Relevant Medications   docusate sodium (COLACE) 100 MG capsule   Other Relevant Orders   Ambulatory referral to Gastroenterology     Other   Fibromyalgia    Chronic and stable Discussed with patient that chronic opioid therapy is not appropriate for fibromyalgia treatment Discussed the importance of regular exercise Patient states she is tried and failed gabapentin and Lyrica in the past She agrees  to try Cymbalta today which we will start at 30 mg daily and increase to 60 mg daily after 1 week My hope is that the Cymbalta would help not only her chronic pain but also her anxiety      Anxiety    Chronic and stable Discussed with patient that chronic benzo therapy is not appropriate for treatment of chronic anxiety Discussed with patient that I will not prescribe chronic benzos and we will try Cymbalta and then attempt to taper off her Valium Since she is only taking Valium twice weekly per report, she likely will not need a taper and we will be able to stop this      Relevant Medications   DULoxetine (CYMBALTA) 30 MG capsule    Other Visit Diagnoses    Chronic pain of right knee    -  Primary   Relevant Orders   DG Knee Complete 4 Views Right (Completed)   DG Knee Bilateral Standing AP (Completed)       Return in about 2 months (around 11/28/2017) for physical.   Addressed extensive list of chronic and acute medical problems today requiring extensive time in counseling and coordination of care.  Over half of this 60 minute visit were spent in counseling and coordinating care of multiple medical problems.   The entirety of the information documented in the History of Present Illness, Review of Systems and Physical Exam were personally obtained by me. Portions of this information were initially documented by Raquel Sarna Ratchford, CMA and reviewed by me for thoroughness and accuracy.    Virginia Crews, MD, MPH [facility] 09/28/2017 2:28 PM

## 2017-09-28 NOTE — Telephone Encounter (Signed)
Pt advised and acknowledges understanding.  

## 2017-10-02 ENCOUNTER — Ambulatory Visit (INDEPENDENT_AMBULATORY_CARE_PROVIDER_SITE_OTHER): Payer: 59 | Admitting: Gastroenterology

## 2017-10-02 ENCOUNTER — Encounter: Payer: Self-pay | Admitting: Gastroenterology

## 2017-10-02 VITALS — BP 142/79 | HR 75 | Ht 62.0 in | Wt 168.6 lb

## 2017-10-02 DIAGNOSIS — R1013 Epigastric pain: Secondary | ICD-10-CM | POA: Diagnosis not present

## 2017-10-02 DIAGNOSIS — K219 Gastro-esophageal reflux disease without esophagitis: Secondary | ICD-10-CM

## 2017-10-02 DIAGNOSIS — Z800 Family history of malignant neoplasm of digestive organs: Secondary | ICD-10-CM

## 2017-10-02 DIAGNOSIS — K5909 Other constipation: Secondary | ICD-10-CM | POA: Diagnosis not present

## 2017-10-02 MED ORDER — DEXLANSOPRAZOLE 30 MG PO CPDR: 30.0000 mg | DELAYED_RELEASE_CAPSULE | Freq: Two times a day (BID) | ORAL | 2 refills | Status: DC

## 2017-10-02 NOTE — Progress Notes (Signed)
Cephas Darby, MD [address]  [place]  [place], [place] [address]  Main: [phone]  Fax: [phone]    Gastroenterology Consultation  Referring Provider:     Virginia Crews, MD Primary Care Physician:  Virginia Crews, MD Primary Gastroenterologist:  Dr. Cephas Darby Reason for Consultation:     GERD, epigastric pain, Bloating and constipation        HPI:   [place] is a 52 y.o. female referred by Dr. Brita Romp, [name], MD  for consultation & management of chronic GERD, bloating, epigastric pain and constipation  Chronic GERD: with erosive esophagitis Patient has been suffering from acid reflux for several years. She tried maximum doses of Prevacid, Protonix, Nexium, Prilosec and zegerid. When she found out about erosive esophagitis, she switched to Dexilant 60 mg twice daily which helped in healing of erosive esophagitis confirmed by EGD in 2018. She moved to [place] and changed her health insurance, therefore here to establish care with new GI. She is currently employed as a Statistician at [employer]. She reports that taking Dexilant 60 mg twice daily is keeping her symptoms under control.  Epigastric pain and bloating: these symptoms started when she turned 52 years old, around menopause. She figured that avoiding milk and repeat helps with bloating significantly. She tells me that whenever she eats a piece of bread or drinks milk, leads to severe bloating and upper abdominal discomfort that she doubles over from pain. She has eliminated wheat and diary as much as she could from her routine. It is unclear whether she was tested for celiac disease or H. Pylori infection although patient denies. She consumes red meat about 2 times a week, drinks Dr. Malachi Bonds 1-2 drinks daily to keep her up during night shifts. She does have ongoing mild epigastric discomfort and bloating  Chronic constipation: she is currently managed  with linaclotide 290 MCG daily and takes Colace additionally as needed. She thinks this medication is fairly keeping the constipation regulated. TSH normal. She reports that she gained significant weight in the process of trying different medications for fibromyalgia  NSAIDs: none  Antiplts/Anticoagulants/Anti thrombotics: none Family history of colon cancer Her sister passed away from colon cancer at age 53 Maternal uncles had colon cancer  GI Procedures:  EGD and colonoscopy 12/14/16 at [facility] Procedure report not available A.  GE junction, endoscopic biopsy: Squamocolumnar junction mucosa with no significant pathologic finding. Negative for goblet cell intestinal metaplasia.  B.  Cecal polyp, endoscopic polypectomy: Sessile serrated adenoma. Negative for conventional dysplasia.  Past Medical History:  Diagnosis Date  . Fatty liver   . Fibromyalgia   . Hemorrhoids   . Hot flashes   . Hypertension   . IBS (irritable bowel syndrome)   . Migraines   . Miscarriage    x 2; 4 live births     Past Surgical History:  Procedure Laterality Date  . ABDOMINAL HYSTERECTOMY     2012 removed cervix h/o abnormal pap   . CHOLECYSTECTOMY     2000  . COLONOSCOPY     2018 with + polpys and GIB 2/2 polyp removal   . ELBOW DEBRIDEMENT    . FOOT SURGERY      Current Outpatient Medications:  .  acyclovir ointment (ZOVIRAX) 5 %, Apply 1 application topically every 3 (three) hours. Prn cold sore, Disp: 30 g, Rfl: 0 .  budesonide (RHINOCORT ALLERGY) 32 MCG/ACT nasal spray, Place into both nostrils daily.,  Disp: , Rfl:  .  cyclobenzaprine (FLEXERIL) 10 MG tablet, Take 1 tablet (10 mg total) by mouth daily as needed for muscle spasms., Disp: 30 tablet, Rfl: 1 .  DEXILANT 60 MG capsule, Take 1 capsule (60 mg total) by mouth daily. 30 minutes before food, Disp: 90 capsule, Rfl: 0 .  diazepam (VALIUM) 2 MG tablet, Take 1 tablet (2 mg total) by mouth daily as needed. for anxiety, Disp: 30 tablet, Rfl:  2 .  docusate sodium (COLACE) 100 MG capsule, Take 100 mg by mouth at bedtime., Disp: , Rfl:  .  DULoxetine (CYMBALTA) 30 MG capsule, Take 1 capsule (30 mg total) by mouth daily for 14 days, THEN 2 capsules (60 mg total) daily for 14 days., Disp: 42 capsule, Rfl: 0 .  HYDROcodone-ibuprofen (VICOPROFEN) 7.5-200 MG tablet, Take 1 tablet by mouth every 8 (eight) hours as needed for moderate pain., Disp: , Rfl:  .  levocetirizine (XYZAL) 5 MG tablet, , Disp: , Rfl: 12 .  LINZESS 290 MCG CAPS capsule, Take 1 capsule (290 mcg total) by mouth daily., Disp: 90 capsule, Rfl: 3 .  meclizine (ANTIVERT) 12.5 MG tablet, Take 1 tablet (12.5 mg total) by mouth 3 (three) times daily as needed for dizziness., Disp: 30 tablet, Rfl: 2 .  montelukast (SINGULAIR) 10 MG tablet, Take 1 tablet (10 mg total) by mouth at bedtime., Disp: 90 tablet, Rfl: 0 .  Multiple Vitamin (MULTIVITAMIN WITH MINERALS) TABS tablet, Take 1 tablet by mouth daily., Disp: , Rfl:  .  promethazine (PHENERGAN) 25 MG tablet, Take 1 tablet (25 mg total) by mouth every 8 (eight) hours as needed for nausea or vomiting., Disp: 30 tablet, Rfl: 2 .  SUMAtriptan (IMITREX) 100 MG tablet, For ha take 1 pill may repeat another in 2 hrs as needed. Do not take more than 2 pills in 1 week, Disp: 10 tablet, Rfl: 2 .  triamterene-hydrochlorothiazide (MAXZIDE-25) 37.5-25 MG tablet, Take 1 tablet by mouth daily., Disp: 90 tablet, Rfl: 0 .  valACYclovir (VALTREX) 500 MG tablet, Take 1 tablet (500 mg total) by mouth 2 (two) times daily. X 1 week prn, Disp: 60 tablet, Rfl: 1 .  Dexlansoprazole 30 MG capsule, Take 1 capsule (30 mg total) by mouth 2 (two) times daily., Disp: 60 capsule, Rfl: 2   Family History  Problem Relation Age of Onset  . Hypertension Mother   . Hyperlipidemia Mother   . Glaucoma Mother   . Diabetes Mother        type 2   . COPD Father        emphysema  . Alcohol abuse Father   . Colon cancer Sister   . Cancer Sister        rectal/colon  cancer no h/o IBD  . Colon polyps Sister   . Asthma Sister   . COPD Sister   . Kidney disease Sister   . Mental illness Sister        bipolar   . Miscarriages / Stillbirths Sister   . Alcohol abuse Son   . Depression Son   . Diabetes Son   . Drug abuse Son   . Mental illness Son        bipolar   . Diabetes Maternal Grandmother   . COPD Paternal Grandmother   . Cancer Paternal Grandfather        FH colon cancer maternal aunts/uncles   . Miscarriages / Stillbirths Sister   . Arthritis Son   . Arthritis Son  Social History   Tobacco Use  . Smoking status: Former Smoker    Packs/day: 0.75    Years: 5.00    Pack years: 3.75    Types: Cigarettes    Last attempt to quit: 07/06/1987    Years since quitting: 30.2  . Smokeless tobacco: Never Used  Substance Use Topics  . Alcohol use: No  . Drug use: Never    Allergies as of 10/02/2017 - Review Complete 10/02/2017  Allergen Reaction Noted  . Ranitidine Diarrhea and Other (See Comments) 08/30/2016  . Amphetamine-dextroamphetamine Other (See Comments) 05/10/2016  . Reglan [metoclopramide] Other (See Comments) 12/15/2016  . Sulfa antibiotics Anxiety 12/15/2016    Review of Systems:    All systems reviewed and negative except where noted in HPI.   Physical Exam:  BP (!) 142/79   Pulse 75   Ht 5\' 2"  (1.575 m)   Wt 168 lb 9.6 oz (76.5 kg)   BMI 30.84 kg/m  No LMP recorded. Patient has had a hysterectomy.  General:   Alert,  Well-developed, well-nourished, pleasant and cooperative in NAD Head:  Normocephalic and atraumatic. Eyes:  Sclera clear, no icterus.   Conjunctiva pink. Ears:  Normal auditory acuity. Nose:  No deformity, discharge, or lesions. Mouth:  No deformity or lesions,oropharynx pink & moist. Neck:  Supple; no masses or thyromegaly. Lungs:  Respirations even and unlabored.  Clear throughout to auscultation.   No wheezes, crackles, or rhonchi. No acute distress. Heart:  Regular rate and rhythm; no  murmurs, clicks, rubs, or gallops. Abdomen:  Normal bowel sounds. Soft, mild epigastric tenderness and mildly distended from bloating without masses, hepatosplenomegaly or hernias noted.  No guarding or rebound tenderness.   Rectal: Not performed Msk:  Symmetrical without gross deformities. Good, equal movement & strength bilaterally. Pulses:  Normal pulses noted. Extremities:  No clubbing or edema.  No cyanosis. Neurologic:  Alert and oriented x3;  grossly normal neurologically. Skin:  Intact without significant lesions or rashes. No jaundice. Psych:  Alert and cooperative. Normal mood and affect.  Imaging Studies: reviewed  Assessment and Plan:   Jakerria Betul Brisky is a 52 y.o. Caucasian female with family history of colon cancer in first-degree relative, chronic GERD with erosive esophagitis, chronic dyspepsia and constipation  Chronic GERD with erosive esophagitis: Dexilant 60 mg twice daily is only medication that has resulted in resolution of her esophagitis I have low threshold to repeat EGD to evaluate for eosinophilic esophagitis before considering antireflux surgery With a change in insurance, she is worried that [place] will be no longer covered. I recommend Dexilant 60 mg twice daily at this time until further evaluation as her GERD symptoms were refractory to other proton pump inhibitors  Chronic dyspepsia: - recommend to rule out H. Pylori gastritis and celiac disease - she said she will look into the previous endoscopy report and bring the records to our office - she will also try to slowly reintroduce half slice to 1 slice of bread every day for 4 weeks at least before performing test for celiac disease. I also discussed with her about genetic testing - Try FD-guard - Avoid meat  Chronic constipation: - continue linaclotide at the current dose with Colace as needed - High fiber diet and fiber supplements  Colon cancer in first-degree relative < 70 years of age - Last  colonoscopy in 12/2016 revealed sessile serrated adenoma in the cecum which was 1 cm in size - Recommend repeat colonoscopy in 3 years - Refer to genetic  counselor   Follow up in 4 weeks   Cephas Darby, MD

## 2017-10-02 NOTE — Patient Instructions (Signed)
High-Fiber Diet  Fiber, also called dietary fiber, is a type of carbohydrate found in fruits, vegetables, whole grains, and beans. A high-fiber diet can have many health benefits. Your health care provider may recommend a high-fiber diet to help:  · Prevent constipation. Fiber can make your bowel movements more regular.  · Lower your cholesterol.  · Relieve hemorrhoids, uncomplicated diverticulosis, or irritable bowel syndrome.  · Prevent overeating as part of a weight-loss plan.  · Prevent heart disease, type 2 diabetes, and certain cancers.    What is my plan?  The recommended daily intake of fiber includes:  · 38 grams for men under age 50.  · 30 grams for men over age 50.  · 25 grams for women under age 50.  · 21 grams for women over age 50.    You can get the recommended daily intake of dietary fiber by eating a variety of fruits, vegetables, grains, and beans. Your health care provider may also recommend a fiber supplement if it is not possible to get enough fiber through your diet.  What do I need to know about a high-fiber diet?  · Fiber supplements have not been widely studied for their effectiveness, so it is better to get fiber through food sources.  · Always check the fiber content on the nutrition facts label of any prepackaged food. Look for foods that contain at least 5 grams of fiber per serving.  · Ask your dietitian if you have questions about specific foods that are related to your condition, especially if those foods are not listed in the following section.  · Increase your daily fiber consumption gradually. Increasing your intake of dietary fiber too quickly may cause bloating, cramping, or gas.  · Drink plenty of water. Water helps you to digest fiber.  What foods can I eat?  Grains  Whole-grain breads. Multigrain cereal. Oats and oatmeal. Brown rice. Barley. Bulgur wheat. Millet. Bran muffins. Popcorn. Rye wafer crackers.  Vegetables   Sweet potatoes. Spinach. Kale. Artichokes. Cabbage. Broccoli. Green peas. Carrots. Squash.  Fruits  Berries. Pears. Apples. Oranges. Avocados. Prunes and raisins. Dried figs.  Meats and Other Protein Sources  Navy, kidney, pinto, and soy beans. Split peas. Lentils. Nuts and seeds.  Dairy  Fiber-fortified yogurt.  Beverages  Fiber-fortified soy milk. Fiber-fortified orange juice.  Other  Fiber bars.  The items listed above may not be a complete list of recommended foods or beverages. Contact your dietitian for more options.  What foods are not recommended?  Grains  White bread. Pasta made with refined flour. White rice.  Vegetables  Fried potatoes. Canned vegetables. Well-cooked vegetables.  Fruits  Fruit juice. Cooked, strained fruit.  Meats and Other Protein Sources  Fatty cuts of meat. Fried poultry or fried fish.  Dairy  Milk. Yogurt. Cream cheese. Sour cream.  Beverages  Soft drinks.  Other  Cakes and pastries. Butter and oils.  The items listed above may not be a complete list of foods and beverages to avoid. Contact your dietitian for more information.  What are some tips for including high-fiber foods in my diet?  · Eat a wide variety of high-fiber foods.  · Make sure that half of all grains consumed each day are whole grains.  · Replace breads and cereals made from refined flour or white flour with whole-grain breads and cereals.  · Replace white rice with brown rice, bulgur wheat, or millet.  · Start the day with a breakfast that is high in fiber,   such as a cereal that contains at least 5 grams of fiber per serving.  · Use beans in place of meat in soups, salads, or pasta.  · Eat high-fiber snacks, such as berries, raw vegetables, nuts, or popcorn.  This information is not intended to replace advice given to you by your health care provider. Make sure you discuss any questions you have with your health care provider.  Document Released: 05/22/2005 Document Revised: 10/28/2015 Document Reviewed: 11/04/2013   Elsevier Interactive Patient Education © 2018 Elsevier Inc.

## 2017-10-09 ENCOUNTER — Other Ambulatory Visit: Payer: Self-pay

## 2017-10-10 ENCOUNTER — Other Ambulatory Visit: Payer: Self-pay | Admitting: Family Medicine

## 2017-10-10 MED ORDER — MONTELUKAST SODIUM 10 MG PO TABS: 10.0000 mg | ORAL_TABLET | Freq: Every day | ORAL | 3 refills | Status: DC

## 2017-10-10 NOTE — Telephone Encounter (Signed)
[facility] pharmacy faxed a refill request for the following medication. Thanks [facility]  montelukast (SINGULAIR) 10 MG tablet

## 2017-10-25 ENCOUNTER — Telehealth: Payer: Self-pay | Admitting: Gastroenterology

## 2017-10-25 NOTE — Telephone Encounter (Signed)
Patient LVM that [facility] NOT received her RX. Please call it in again. Also, patient would like a call back when this is done.

## 2017-10-26 ENCOUNTER — Other Ambulatory Visit: Payer: Self-pay | Admitting: Otolaryngology

## 2017-10-26 ENCOUNTER — Other Ambulatory Visit (HOSPITAL_COMMUNITY): Payer: Self-pay | Admitting: Otolaryngology

## 2017-10-26 ENCOUNTER — Telehealth: Payer: Self-pay

## 2017-10-26 DIAGNOSIS — J342 Deviated nasal septum: Secondary | ICD-10-CM | POA: Diagnosis not present

## 2017-10-26 DIAGNOSIS — J329 Chronic sinusitis, unspecified: Secondary | ICD-10-CM

## 2017-10-26 NOTE — Telephone Encounter (Signed)
Patient and pharmacist at [facility] have been informed of rx change from [place] to Prevacid 20 mg BID with 2 refills.  Thanks [employer]

## 2017-10-26 NOTE — Telephone Encounter (Signed)
Clarification of Prevacid rx: Fatima at  [facility] has been informed that rx should be 30 mg BID as Prevacid does not come in 20 mg.  Thanks [employer]

## 2017-10-30 ENCOUNTER — Other Ambulatory Visit: Payer: Self-pay | Admitting: Family Medicine

## 2017-10-30 NOTE — Telephone Encounter (Signed)
LOV 09/28/2017. Pt was advised to return in 2 months for cpe. Has appointment 11/21/2017.

## 2017-10-31 ENCOUNTER — Ambulatory Visit
Admission: RE | Admit: 2017-10-31 | Discharge: 2017-10-31 | Disposition: A | Discharge status: Home / Self Care | Payer: 59 | Source: Ambulatory Visit | Attending: Otolaryngology | Admitting: Otolaryngology

## 2017-10-31 DIAGNOSIS — J329 Chronic sinusitis, unspecified: Secondary | ICD-10-CM | POA: Diagnosis not present

## 2017-10-31 DIAGNOSIS — J3489 Other specified disorders of nose and nasal sinuses: Secondary | ICD-10-CM | POA: Diagnosis not present

## 2017-11-07 DIAGNOSIS — R51 Headache: Secondary | ICD-10-CM | POA: Diagnosis not present

## 2017-11-07 DIAGNOSIS — J301 Allergic rhinitis due to pollen: Secondary | ICD-10-CM | POA: Diagnosis not present

## 2017-11-12 ENCOUNTER — Ambulatory Visit (INDEPENDENT_AMBULATORY_CARE_PROVIDER_SITE_OTHER): Payer: 59 | Admitting: Family Medicine

## 2017-11-12 ENCOUNTER — Encounter: Payer: Self-pay | Admitting: Family Medicine

## 2017-11-12 VITALS — BP 122/80 | HR 75 | Temp 98.5°F | Resp 16 | Wt 165.0 lb

## 2017-11-12 DIAGNOSIS — R109 Unspecified abdominal pain: Secondary | ICD-10-CM

## 2017-11-12 DIAGNOSIS — N3090 Cystitis, unspecified without hematuria: Secondary | ICD-10-CM | POA: Diagnosis not present

## 2017-11-12 LAB — POCT URINALYSIS DIPSTICK
Appearance: NORMAL
Bilirubin, UA: NEGATIVE
Glucose, UA: NEGATIVE
Ketones, UA: NEGATIVE
NITRITE UA: NEGATIVE
ODOR: NORMAL
PROTEIN UA: NEGATIVE
Urobilinogen, UA: 0.2 E.U./dL
pH, UA: 7 (ref 5.0–8.0)

## 2017-11-12 MED ORDER — CEPHALEXIN 500 MG PO CAPS: 500.0000 mg | ORAL_CAPSULE | Freq: Two times a day (BID) | ORAL | 0 refills | Status: AC

## 2017-11-12 NOTE — Patient Instructions (Signed)

## 2017-11-12 NOTE — Progress Notes (Signed)
Patient: Elizabeth Bowers Female    DOB: 01-Aug-1965   52 y.o.   MRN: [id number] Visit Date: 11/12/2017  Today's Provider: Lavon Paganini, MD   I, Martha Clan, CMA, am acting as scribe for Lavon Paganini, MD.  Chief Complaint  Patient presents with  . Urinary Tract Infection   Subjective:    Urinary Tract Infection   This is a new problem. Episode onset: x 2 days. The patient is experiencing no pain. Maximum temperature: no documented temperature, but has had cold sweats. Associated symptoms include chills, flank pain (right), frequency, hesitancy, sweats and urgency. Pertinent negatives include no discharge, hematuria, nausea, possible pregnancy or vomiting. Associated symptoms comments: Malodorous and discolored urine.. She has tried NSAIDs ([place]) for the symptoms. The treatment provided mild relief.       Allergies  Allergen Reactions  . Ranitidine Diarrhea and Other (See Comments)    Diarrhea and stomach cramping  . Amphetamine-Dextroamphetamine Other (See Comments)    Elevated BP  . Reglan [Metoclopramide] Other (See Comments)    Increased prolactin level   . Sulfa Antibiotics Anxiety     Current Outpatient Medications:  .  acyclovir ointment (ZOVIRAX) 5 %, Apply 1 application topically every 3 (three) hours. Prn cold sore, Disp: 30 g, Rfl: 0 .  budesonide (RHINOCORT ALLERGY) 32 MCG/ACT nasal spray, Place into both nostrils daily., Disp: , Rfl:  .  cyclobenzaprine (FLEXERIL) 10 MG tablet, Take 1 tablet (10 mg total) by mouth daily as needed for muscle spasms., Disp: 30 tablet, Rfl: 1 .  diazepam (VALIUM) 2 MG tablet, Take 1 tablet (2 mg total) by mouth daily as needed. for anxiety, Disp: 30 tablet, Rfl: 2 .  docusate sodium (COLACE) 100 MG capsule, Take 200 mg by mouth at bedtime. , Disp: , Rfl:  .  DULoxetine (CYMBALTA) 60 MG capsule, Take 1 capsule (60 mg total) by mouth daily., Disp: 30 capsule, Rfl: 1 .  HYDROcodone-ibuprofen (VICOPROFEN) 7.5-200 MG  tablet, Take 1 tablet by mouth every 8 (eight) hours as needed for moderate pain., Disp: , Rfl:  .  lansoprazole (PREVACID) 30 MG capsule, , Disp: , Rfl: 2 .  levocetirizine (XYZAL) 5 MG tablet, , Disp: , Rfl: 12 .  LINZESS 290 MCG CAPS capsule, Take 1 capsule (290 mcg total) by mouth daily., Disp: 90 capsule, Rfl: 3 .  meclizine (ANTIVERT) 12.5 MG tablet, Take 1 tablet (12.5 mg total) by mouth 3 (three) times daily as needed for dizziness., Disp: 30 tablet, Rfl: 2 .  montelukast (SINGULAIR) 10 MG tablet, Take 1 tablet (10 mg total) by mouth at bedtime., Disp: 90 tablet, Rfl: 3 .  Multiple Vitamin (MULTIVITAMIN WITH MINERALS) TABS tablet, Take 1 tablet by mouth daily., Disp: , Rfl:  .  promethazine (PHENERGAN) 25 MG tablet, Take 1 tablet (25 mg total) by mouth every 8 (eight) hours as needed for nausea or vomiting., Disp: 30 tablet, Rfl: 2 .  SUMAtriptan (IMITREX) 100 MG tablet, For ha take 1 pill may repeat another in 2 hrs as needed. Do not take more than 2 pills in 1 week, Disp: 10 tablet, Rfl: 2 .  triamterene-hydrochlorothiazide (MAXZIDE-25) 37.5-25 MG tablet, Take 1 tablet by mouth daily., Disp: 90 tablet, Rfl: 0 .  valACYclovir (VALTREX) 500 MG tablet, Take 1 tablet (500 mg total) by mouth 2 (two) times daily. X 1 week prn, Disp: 60 tablet, Rfl: 1  Review of Systems  Constitutional: Positive for chills.  Gastrointestinal: Negative for nausea and  vomiting.  Genitourinary: Positive for flank pain (right), frequency, hesitancy and urgency. Negative for hematuria.  Neurological: Positive for headaches.    Social History   Tobacco Use  . Smoking status: Former Smoker    Packs/day: 0.75    Years: 5.00    Pack years: 3.75    Types: Cigarettes    Last attempt to quit: 07/06/1987    Years since quitting: 30.3  . Smokeless tobacco: Never Used  Substance Use Topics  . Alcohol use: No   Objective:   BP 122/80 (BP Location: Left Arm, Patient Position: Sitting, Cuff Size: Large)   Pulse 75    Temp 98.5 F (36.9 C) (Oral)   Resp 16   Wt 165 lb (74.8 kg)   SpO2 99%   BMI 30.18 kg/m  Vitals:   11/12/17 1333  BP: 122/80  Pulse: 75  Resp: 16  Temp: 98.5 F (36.9 C)  TempSrc: Oral  SpO2: 99%  Weight: 165 lb (74.8 kg)     Physical Exam  Constitutional: She is oriented to person, place, and time. She appears well-developed and well-nourished. No distress.  HENT:  Head: Normocephalic and atraumatic.  Eyes: Conjunctivae are normal.  Neck: Neck supple. No thyromegaly present.  Cardiovascular: Normal rate, regular rhythm, normal heart sounds and intact distal pulses.  No murmur heard. Pulmonary/Chest: Effort normal and breath sounds normal. No respiratory distress. She has no wheezes. She has no rales.  Abdominal: Soft. She exhibits no distension. There is no tenderness. There is no rigidity, no guarding and no CVA tenderness.  Musculoskeletal: She exhibits no edema.  Lymphadenopathy:    She has no cervical adenopathy.  Neurological: She is alert and oriented to person, place, and time.  Skin: Skin is warm and dry. Capillary refill takes less than 2 seconds. No rash noted.  Psychiatric: She has a normal mood and affect. Her behavior is normal.  Vitals reviewed.    Results for orders placed or performed in visit on 11/12/17  POCT urinalysis dipstick  Result Value Ref Range   Color, UA yellow    Clarity, UA clear    Glucose, UA Negative Negative   Bilirubin, UA negative    Ketones, UA negative    Spec Grav, UA <=1.005 (A) 1.010 - 1.025   Blood, UA NH Trace    pH, UA 7.0 5.0 - 8.0   Protein, UA Negative Negative   Urobilinogen, UA 0.2 0.2 or 1.0 E.U./dL   Nitrite, UA negative    Leukocytes, UA Trace (A) Negative   Appearance normal    Odor normal       Assessment & Plan:   1. Right flank pain 2. Cystitis - Symptoms c/w UTI and UA with trace leuks and NH trace blood - will send urine culture to confirm and check sensitivities - no evidence of systemic  illness or pyelonephritis - start treatment with Keflex x5d - return precautions discussed - POCT urinalysis dipstick - Urine Culture    Meds ordered this encounter  Medications  . cephALEXin (KEFLEX) 500 MG capsule    Sig: Take 1 capsule (500 mg total) by mouth 2 (two) times daily for 5 days.    Dispense:  10 capsule    Refill:  0     Return if symptoms worsen or fail to improve.   The entirety of the information documented in the History of Present Illness, Review of Systems and Physical Exam were personally obtained by me. Portions of this information were initially documented  by Martha Clan, CMA and reviewed by me for thoroughness and accuracy.    Virginia Crews, MD, MPH [facility] 11/12/2017 2:01 PM

## 2017-11-14 ENCOUNTER — Ambulatory Visit: Payer: 59 | Admitting: Gastroenterology

## 2017-11-14 ENCOUNTER — Telehealth: Payer: Self-pay

## 2017-11-14 LAB — URINE CULTURE

## 2017-11-14 NOTE — Telephone Encounter (Signed)
-----   Message from Virginia Crews, MD sent at 11/14/2017  2:06 PM EDT ----- Insignificant growth on Urine culture.  Has likely already finished antibiotic course, however, but can stop if she has any left.  Virginia Crews, MD, MPH [facility] 11/14/2017 2:06 PM

## 2017-11-14 NOTE — Telephone Encounter (Signed)
LMTCB

## 2017-11-15 NOTE — Telephone Encounter (Signed)
Please review

## 2017-11-15 NOTE — Telephone Encounter (Signed)
Noted. Patient likely has MSK pain as this is not related to UTI as she had thought.  We will f/u next week.  Virginia Crews, MD, MPH [facility] 11/15/2017 9:58 AM

## 2017-11-15 NOTE — Telephone Encounter (Signed)
Patient was advised of results and states that she is not better.  She reports that this morning she started having pain on the left side as well.  She was offered an appointment but wanted to defer until her appointment next Wed.  She said if she got significantly worse she would cal back.     ----- Message from Virginia Crews, MD sent at 11/14/2017  2:06 PM EDT ----- Insignificant growth on Urine culture.  Has likely already finished antibiotic course, however, but can stop if she has any left.  Virginia Crews, MD, MPH [facility] 11/14/2017 2:06 PM

## 2017-11-15 NOTE — Telephone Encounter (Signed)
-----   Message from Virginia Crews, MD sent at 11/14/2017  2:06 PM EDT ----- Insignificant growth on Urine culture.  Has likely already finished antibiotic course, however, but can stop if she has any left.  Virginia Crews, MD, MPH [facility] 11/14/2017 2:06 PM

## 2017-11-21 ENCOUNTER — Encounter: Payer: Self-pay | Admitting: Family Medicine

## 2017-11-21 ENCOUNTER — Ambulatory Visit (INDEPENDENT_AMBULATORY_CARE_PROVIDER_SITE_OTHER): Payer: 59 | Admitting: Family Medicine

## 2017-11-21 VITALS — BP 120/82 | HR 80 | Temp 98.2°F | Resp 16 | Ht 62.0 in | Wt 168.0 lb

## 2017-11-21 DIAGNOSIS — Z0000 Encounter for general adult medical examination without abnormal findings: Secondary | ICD-10-CM

## 2017-11-21 DIAGNOSIS — Z1231 Encounter for screening mammogram for malignant neoplasm of breast: Secondary | ICD-10-CM

## 2017-11-21 DIAGNOSIS — I10 Essential (primary) hypertension: Secondary | ICD-10-CM | POA: Diagnosis not present

## 2017-11-21 DIAGNOSIS — Z6832 Body mass index (BMI) 32.0-32.9, adult: Secondary | ICD-10-CM | POA: Insufficient documentation

## 2017-11-21 DIAGNOSIS — E663 Overweight: Secondary | ICD-10-CM

## 2017-11-21 DIAGNOSIS — Z6830 Body mass index (BMI) 30.0-30.9, adult: Secondary | ICD-10-CM | POA: Diagnosis not present

## 2017-11-21 DIAGNOSIS — E669 Obesity, unspecified: Secondary | ICD-10-CM

## 2017-11-21 DIAGNOSIS — Z1239 Encounter for other screening for malignant neoplasm of breast: Secondary | ICD-10-CM

## 2017-11-21 MED ORDER — TOPIRAMATE 50 MG PO TABS
50.0000 mg | ORAL_TABLET | Freq: Every day | ORAL | 2 refills | Status: DC
Start: 2017-11-21 — End: 2018-01-16

## 2017-11-21 MED ORDER — PHENTERMINE HCL 37.5 MG PO CAPS: 37.5000 mg | ORAL_CAPSULE | ORAL | 2 refills | Status: DC

## 2017-11-21 NOTE — Progress Notes (Signed)
Patient: Elizabeth Bowers, Female    DOB: [birth year], 52 y.o.   MRN: [id number] Visit Date: 11/21/2017  Today's Provider: Lavon Paganini, MD   I, Martha Clan, CMA, am acting as scribe for Lavon Paganini, MD.  Chief Complaint  Patient presents with  . Annual Exam   Subjective:    Annual physical exam Elizabeth Bowers is a 52 y.o. female who presents today for health maintenance and complete physical. She feels fairly well. She is c/o fatigue and left otalgia x 1 day. She reports exercising none outside of work. She reports she is sleeping poorly due to working night shift.  Last pap- S/P hysterectomy in 2012 . Does not have cervix per pt.  Last mammogram- 02/01/2017- BI-RADS 1 Last colonoscopy- 12/14/2016- sessile serrated adenoma. Repeat 3 years.  Patient is concerned about her weight.  She states she is active at work, but does not believe she needs formal exercise.  She eats GF and lactose free.  She states that her breakfast is typically cereal, waffles, etc.  She believes that her diet is healthy because she eats "good carbs," - GF, brown rice.  Discussed that these are still carbs and can contribute to weight gain.  She snacks all night long on night shift to stay awake. -----------------------------------------------------------------   Review of Systems  Constitutional: Positive for diaphoresis. Negative for activity change, appetite change, chills, fatigue, fever and unexpected weight change.  HENT: Positive for ear pain, postnasal drip and sinus pressure. Negative for congestion, dental problem, drooling, ear discharge, facial swelling, hearing loss, mouth sores, nosebleeds, rhinorrhea, sinus pain, sneezing, sore throat, tinnitus, trouble swallowing and voice change.   Eyes: Negative.   Respiratory: Positive for shortness of breath. Negative for apnea, cough, choking, chest tightness, wheezing and stridor.   Cardiovascular: Negative.   Gastrointestinal:  Positive for constipation. Negative for abdominal distention, abdominal pain, anal bleeding, blood in stool, diarrhea, nausea, rectal pain and vomiting.  Endocrine: Negative.   Genitourinary: Negative.   Musculoskeletal: Positive for back pain, neck pain and neck stiffness. Negative for arthralgias, gait problem, joint swelling and myalgias.  Skin: Negative.   Allergic/Immunologic: Positive for environmental allergies and food allergies. Negative for immunocompromised state.  Neurological: Positive for dizziness, light-headedness, numbness and headaches. Negative for tremors, seizures, syncope, facial asymmetry, speech difficulty and weakness.  Hematological: Negative.   Psychiatric/Behavioral: Negative.     Social History      She  reports that she quit smoking about 30 years ago. Her smoking use included cigarettes. She has a 3.75 pack-year smoking history. She has never used smokeless tobacco. She reports that she drinks about 1.2 - 2.4 oz of alcohol per week. She reports that she does not use drugs.       Social History   Socioeconomic History  . Marital status: Married    Spouse name: Kyung Rudd  . Number of children: 6  . Years of education: 38  . Highest education level: Associate degree: academic program  Occupational History  . Occupation: Microbiologist: [employer]  . Financial resource strain: Not hard at all  . Food insecurity:    Worry: Never true    Inability: Never true  . Transportation needs:    Medical: No    Non-medical: No  Tobacco Use  . Smoking status: Former Smoker    Packs/day: 0.75    Years: 5.00    Pack years: 3.75    Types: Cigarettes  Last attempt to quit: 07/06/1987    Years since quitting: 30.4  . Smokeless tobacco: Never Used  Substance and Sexual Activity  . Alcohol use: Yes    Alcohol/week: 1.2 - 2.4 oz    Types: 2 - 4 Cans of beer per week    Comment: wine cooler  . Drug use: Never  . Sexual activity: Not on  file  Lifestyle  . Physical activity:    Days per week: 0 days    Minutes per session: 0 min  . Stress: Not on file  Relationships  . Social connections:    Talks on phone: Not on file    Gets together: Not on file    Attends religious service: Not on file    Active member of club or organization: Not on file    Attends meetings of clubs or organizations: Not on file    Relationship status: Not on file  Other Topics Concern  . Not on file  Social History Narrative  . Not on file    Past Medical History:  Diagnosis Date  . Fatty liver   . Fibromyalgia   . Hemorrhoids   . Hot flashes   . Hypertension   . IBS (irritable bowel syndrome)   . Migraines   . Miscarriage    x 2; 4 live births      Patient Active Problem List   Diagnosis Date Noted  . Anxiety 09/28/2017  . Chronic pain of right knee 09/28/2017  . Fatty liver 07/09/2017  . Colon polyps 07/09/2017  . Fibromyalgia 07/09/2017  . Sinusitis, chronic 07/09/2017  . Allergic rhinitis 07/09/2017  . IBS (irritable bowel syndrome) 07/09/2017  . GERD (gastroesophageal reflux disease) 07/09/2017  . Migraine 07/09/2017  . Eyelid dermatitis, allergic/contact 07/09/2017  . Hypertension 01/12/2017  . Superficial thrombophlebitis 01/12/2017  . Rectal bleeding 12/16/2016  . LUQ abdominal pain 10/25/2016  . Degenerative disc disease, lumbar 07/18/2016  . Family hx of colon cancer 07/18/2016    Past Surgical History:  Procedure Laterality Date  . ABDOMINAL HYSTERECTOMY     2012 removed cervix h/o abnormal pap   . CHOLECYSTECTOMY     2000  . COLONOSCOPY     2018 with + polpys and GIB 2/2 polyp removal   . ELBOW DEBRIDEMENT    . FOOT SURGERY      Family History        Family Status  Relation Name Status  . Mother  Alive  . Father  Deceased  . Sister  Deceased  . Sister  Alive  . Brother  Alive  . Daughter  Alive  . Son [employer]  . MGM  Deceased  . PGM  (Not Specified)  . PGF  (Not Specified)  . Sister   Alive  . Son  Alive  . Son  (Not Specified)        Her family history includes Alcohol abuse in her father and son; Arthritis in her son and son; Asthma in her sister; COPD in her father, paternal grandmother, and sister; Cancer in her paternal grandfather and sister; Colon cancer in her sister; Colon polyps in her sister; Depression in her son; Diabetes in her maternal grandmother, mother, and son; Drug abuse in her son; Glaucoma in her mother; Hyperlipidemia in her mother; Hypertension in her mother; Kidney disease in her sister; Mental illness in her sister and son; Miscarriages / Stillbirths in her sister and sister; Uterine cancer in her sister.  Allergies  Allergen Reactions  . Ranitidine Diarrhea and Other (See Comments)    Diarrhea and stomach cramping  . Amphetamine-Dextroamphetamine Other (See Comments)    Elevated BP  . Reglan [Metoclopramide] Other (See Comments)    Increased prolactin level   . Treximet [Sumatriptan-Naproxen Sodium]     Muscle spasms  . Sulfa Antibiotics Anxiety     Current Outpatient Medications:  .  acyclovir ointment (ZOVIRAX) 5 %, Apply 1 application topically every 3 (three) hours. Prn cold sore, Disp: 30 g, Rfl: 0 .  budesonide (RHINOCORT ALLERGY) 32 MCG/ACT nasal spray, Place into both nostrils daily., Disp: , Rfl:  .  cyclobenzaprine (FLEXERIL) 10 MG tablet, Take 1 tablet (10 mg total) by mouth daily as needed for muscle spasms., Disp: 30 tablet, Rfl: 1 .  diazepam (VALIUM) 2 MG tablet, Take 1 tablet (2 mg total) by mouth daily as needed. for anxiety, Disp: 30 tablet, Rfl: 2 .  docusate sodium (COLACE) 100 MG capsule, Take 200 mg by mouth at bedtime. , Disp: , Rfl:  .  DULoxetine (CYMBALTA) 60 MG capsule, Take 1 capsule (60 mg total) by mouth daily., Disp: 30 capsule, Rfl: 1 .  HYDROcodone-ibuprofen (VICOPROFEN) 7.5-200 MG tablet, Take 1 tablet by mouth every 8 (eight) hours as needed for moderate pain., Disp: , Rfl:  .  lansoprazole (PREVACID)  30 MG capsule, , Disp: , Rfl: 2 .  levocetirizine (XYZAL) 5 MG tablet, , Disp: , Rfl: 12 .  LINZESS 290 MCG CAPS capsule, Take 1 capsule (290 mcg total) by mouth daily., Disp: 90 capsule, Rfl: 3 .  meclizine (ANTIVERT) 12.5 MG tablet, Take 1 tablet (12.5 mg total) by mouth 3 (three) times daily as needed for dizziness., Disp: 30 tablet, Rfl: 2 .  montelukast (SINGULAIR) 10 MG tablet, Take 1 tablet (10 mg total) by mouth at bedtime., Disp: 90 tablet, Rfl: 3 .  Multiple Vitamin (MULTIVITAMIN WITH MINERALS) TABS tablet, Take 1 tablet by mouth daily., Disp: , Rfl:  .  promethazine (PHENERGAN) 25 MG tablet, Take 1 tablet (25 mg total) by mouth every 8 (eight) hours as needed for nausea or vomiting., Disp: 30 tablet, Rfl: 2 .  SUMAtriptan (IMITREX) 100 MG tablet, For ha take 1 pill may repeat another in 2 hrs as needed. Do not take more than 2 pills in 1 week, Disp: 10 tablet, Rfl: 2 .  triamterene-hydrochlorothiazide (MAXZIDE-25) 37.5-25 MG tablet, Take 1 tablet by mouth daily., Disp: 90 tablet, Rfl: 0 .  valACYclovir (VALTREX) 500 MG tablet, Take 1 tablet (500 mg total) by mouth 2 (two) times daily. X 1 week prn, Disp: 60 tablet, Rfl: 1   Patient Care Team: Virginia Crews, MD as PCP - General (Family Medicine)      Objective:   Vitals: BP 120/82 (BP Location: Left Arm, Patient Position: Sitting, Cuff Size: Large)   Pulse 80   Temp 98.2 F (36.8 C) (Oral)   Resp 16   Ht 5\' 2"  (1.575 m)   Wt 168 lb (76.2 kg)   SpO2 98%   BMI 30.73 kg/m    Vitals:   11/21/17 0907  BP: 120/82  Pulse: 80  Resp: 16  Temp: 98.2 F (36.8 C)  TempSrc: Oral  SpO2: 98%  Weight: 168 lb (76.2 kg)  Height: 5\' 2"  (1.575 m)     Physical Exam  Constitutional: She is oriented to person, place, and time. She appears well-developed and well-nourished. No distress.  HENT:  Head: Normocephalic and atraumatic.  Right  Ear: External ear normal.  Left Ear: External ear normal.  Nose: Nose normal.    Mouth/Throat: Oropharynx is clear and moist.  Eyes: Pupils are equal, round, and reactive to light. Conjunctivae and EOM are normal. No scleral icterus.  Neck: Neck supple. No thyromegaly present.  Cardiovascular: Normal rate, regular rhythm, normal heart sounds and intact distal pulses.  No murmur heard. Pulmonary/Chest: Effort normal and breath sounds normal. No respiratory distress. She has no wheezes. She has no rales.  Abdominal: Soft. Bowel sounds are normal. She exhibits no distension. There is no tenderness. There is no rebound and no guarding.  Genitourinary:  Genitourinary Comments: Breasts: breasts appear normal, no suspicious masses, no skin or nipple changes or axillary nodes.   Musculoskeletal: She exhibits no edema or deformity.  Lymphadenopathy:    She has no cervical adenopathy.  Neurological: She is alert and oriented to person, place, and time.  Skin: Skin is warm and dry. Capillary refill takes less than 2 seconds. No rash noted.  Psychiatric: She has a normal mood and affect. Her behavior is normal.  Vitals reviewed.    Depression Screen PHQ 2/9 Scores 09/28/2017 07/06/2017  PHQ - 2 Score 1 0     Assessment & Plan:     Routine Health Maintenance and Physical Exam  Exercise Activities and Dietary recommendations Goals    None       There is no immunization history on file for this patient.  Health Maintenance  Topic Date Due  . TETANUS/TDAP  09/10/1983  . PAP SMEAR  09/09/1985  . MAMMOGRAM  09/10/2014  . COLONOSCOPY  09/10/2014  . INFLUENZA VACCINE  01/03/2018  . HIV Screening  Completed     Discussed health benefits of physical activity, and encouraged her to engage in regular exercise appropriate for her age and condition.    --------------------------------------------------------------------  Problem List Items Addressed This Visit      Cardiovascular and Mediastinum   Hypertension   Relevant Orders   Comprehensive metabolic panel      Other   Class 1 obesity with body mass index (BMI) of 30.0 to 30.9 in adult    Long discussion regarding diet, exercise, healthy weight management Discussed options for medical therapy for weight loss Patient chose to try generic qsymia - separate Rx for phentermine and topamax Discussed that phentermine will only be prescribed for 3 months Discussed importance of making lifestyle changes while taking these medications as she is at risk for rebound weight gain after stopping this Could consider continuing Topamax after stopping phentermine if this is helping with weight loss Follow-up in 3 months on weight      Relevant Medications   phentermine 37.5 MG capsule   Other Relevant Orders   CBC   Lipid panel    Other Visit Diagnoses    Encounter for annual physical exam    -  Primary   Relevant Orders   CBC   Comprehensive metabolic panel   Lipid panel   MS DIGITAL SCREENING TOMO BILATERAL   Screening for breast cancer       Relevant Orders   MS DIGITAL SCREENING TOMO BILATERAL      Patient to bring copy of shot record from employer to next visit   Return in about 3 months (around 02/21/2018) for obesity f/u.   The entirety of the information documented in the History of Present Illness, Review of Systems and Physical Exam were personally obtained by me. Portions of this information were initially  documented by Martha Clan, CMA and reviewed by me for thoroughness and accuracy.    Virginia Crews, MD, MPH [facility] 11/21/2017 10:05 AM

## 2017-11-21 NOTE — Patient Instructions (Signed)
Preventive Care 40-64 Years, Female Preventive care refers to lifestyle choices and visits with your health care provider that can promote health and wellness. What does preventive care include?  A yearly physical exam. This is also called an annual well check.  Dental exams once or twice a year.  Routine eye exams. Ask your health care provider how often you should have your eyes checked.  Personal lifestyle choices, including: ? Daily care of your teeth and gums. ? Regular physical activity. ? Eating a healthy diet. ? Avoiding tobacco and drug use. ? Limiting alcohol use. ? Practicing safe sex. ? Taking low-dose aspirin daily starting at age 58. ? Taking vitamin and mineral supplements as recommended by your health care provider. What happens during an annual well check? The services and screenings done by your health care provider during your annual well check will depend on your age, overall health, lifestyle risk factors, and family history of disease. Counseling Your health care provider may ask you questions about your:  Alcohol use.  Tobacco use.  Drug use.  Emotional well-being.  Home and relationship well-being.  Sexual activity.  Eating habits.  Work and work Statistician.  Method of birth control.  Menstrual cycle.  Pregnancy history.  Screening You may have the following tests or measurements:  Height, weight, and BMI.  Blood pressure.  Lipid and cholesterol levels. These may be checked every 5 years, or more frequently if you are over 81 years old.  Skin check.  Lung cancer screening. You may have this screening every year starting at age 78 if you have a 30-pack-year history of smoking and currently smoke or have quit within the past 15 years.  Fecal occult blood test (FOBT) of the stool. You may have this test every year starting at age 65.  Flexible sigmoidoscopy or colonoscopy. You may have a sigmoidoscopy every 5 years or a colonoscopy  every 10 years starting at age 30.  Hepatitis C blood test.  Hepatitis B blood test.  Sexually transmitted disease (STD) testing.  Diabetes screening. This is done by checking your blood sugar (glucose) after you have not eaten for a while (fasting). You may have this done every 1-3 years.  Mammogram. This may be done every 1-2 years. Talk to your health care provider about when you should start having regular mammograms. This may depend on whether you have a family history of breast cancer.  BRCA-related cancer screening. This may be done if you have a family history of breast, ovarian, tubal, or peritoneal cancers.  Pelvic exam and Pap test. This may be done every 3 years starting at age 80. Starting at age 36, this may be done every 5 years if you have a Pap test in combination with an HPV test.  Bone density scan. This is done to screen for osteoporosis. You may have this scan if you are at high risk for osteoporosis.  Discuss your test results, treatment options, and if necessary, the need for more tests with your health care provider. Vaccines Your health care provider may recommend certain vaccines, such as:  Influenza vaccine. This is recommended every year.  Tetanus, diphtheria, and acellular pertussis (Tdap, Td) vaccine. You may need a Td booster every 10 years.  Varicella vaccine. You may need this if you have not been vaccinated.  Zoster vaccine. You may need this after age 5.  Measles, mumps, and rubella (MMR) vaccine. You may need at least one dose of MMR if you were born in  1957 or later. You may also need a second dose.  Pneumococcal 13-valent conjugate (PCV13) vaccine. You may need this if you have certain conditions and were not previously vaccinated.  Pneumococcal polysaccharide (PPSV23) vaccine. You may need one or two doses if you smoke cigarettes or if you have certain conditions.  Meningococcal vaccine. You may need this if you have certain  conditions.  Hepatitis A vaccine. You may need this if you have certain conditions or if you travel or work in places where you may be exposed to hepatitis A.  Hepatitis B vaccine. You may need this if you have certain conditions or if you travel or work in places where you may be exposed to hepatitis B.  Haemophilus influenzae type b (Hib) vaccine. You may need this if you have certain conditions.  Talk to your health care provider about which screenings and vaccines you need and how often you need them. This information is not intended to replace advice given to you by your health care provider. Make sure you discuss any questions you have with your health care provider. Document Released: 06/18/2015 Document Revised: 02/09/2016 Document Reviewed: 03/23/2015 Elsevier Interactive Patient Education  2018 [employer] Inc[employer]  

## 2017-11-21 NOTE — Assessment & Plan Note (Signed)
Long discussion regarding diet, exercise, healthy weight management Discussed options for medical therapy for weight loss Patient chose to try generic qsymia - separate Rx for phentermine and topamax Discussed that phentermine will only be prescribed for 3 months Discussed importance of making lifestyle changes while taking these medications as she is at risk for rebound weight gain after stopping this Could consider continuing Topamax after stopping phentermine if this is helping with weight loss Follow-up in 3 months on weight

## 2017-11-26 DIAGNOSIS — Z6830 Body mass index (BMI) 30.0-30.9, adult: Secondary | ICD-10-CM | POA: Diagnosis not present

## 2017-11-26 DIAGNOSIS — Z0000 Encounter for general adult medical examination without abnormal findings: Secondary | ICD-10-CM | POA: Diagnosis not present

## 2017-11-26 DIAGNOSIS — I10 Essential (primary) hypertension: Secondary | ICD-10-CM | POA: Diagnosis not present

## 2017-11-26 DIAGNOSIS — E669 Obesity, unspecified: Secondary | ICD-10-CM | POA: Diagnosis not present

## 2017-11-27 ENCOUNTER — Telehealth: Payer: Self-pay

## 2017-11-27 DIAGNOSIS — E876 Hypokalemia: Secondary | ICD-10-CM

## 2017-11-27 LAB — LIPID PANEL
CHOLESTEROL TOTAL: 121 mg/dL (ref 100–199)
Chol/HDL Ratio: 2.5 ratio (ref 0.0–4.4)
HDL: 49 mg/dL (ref >39)
LDL CALC: 63 mg/dL (ref 0–99)
TRIGLYCERIDES: 44 mg/dL (ref 0–149)
VLDL Cholesterol Cal: 9 mg/dL (ref 5–40)

## 2017-11-27 LAB — COMPREHENSIVE METABOLIC PANEL
ALK PHOS: 100 IU/L (ref 39–117)
ALT: 24 IU/L (ref 0–32)
AST: 25 IU/L (ref 0–40)
Albumin/Globulin Ratio: 1.7 (ref 1.2–2.2)
Albumin: 4.3 g/dL (ref 3.5–5.5)
BILIRUBIN TOTAL: 0.7 mg/dL (ref 0.0–1.2)
BUN/Creatinine Ratio: 8 — ABNORMAL LOW (ref 9–23)
BUN: 11 mg/dL (ref 6–24)
CHLORIDE: 99 mmol/L (ref 96–106)
CO2: 24 mmol/L (ref 20–29)
CREATININE: 1.31 mg/dL — AB (ref 0.57–1.00)
Calcium: 9.9 mg/dL (ref 8.7–10.2)
GFR calc Af Amer: 54 mL/min/{1.73_m2} — ABNORMAL LOW (ref >59)
GFR calc non Af Amer: 47 mL/min/{1.73_m2} — ABNORMAL LOW (ref >59)
GLUCOSE: 103 mg/dL — AB (ref 65–99)
Globulin, Total: 2.6 g/dL (ref 1.5–4.5)
Potassium: 3.1 mmol/L — ABNORMAL LOW (ref 3.5–5.2)
Sodium: 139 mmol/L (ref 134–144)
Total Protein: 6.9 g/dL (ref 6.0–8.5)

## 2017-11-27 LAB — CBC
HEMATOCRIT: 42.6 % (ref 34.0–46.6)
HEMOGLOBIN: 14.6 g/dL (ref 11.1–15.9)
MCH: 30 pg (ref 26.6–33.0)
MCHC: 34.3 g/dL (ref 31.5–35.7)
MCV: 88 fL (ref 79–97)
Platelets: 303 10*3/uL (ref 150–450)
RBC: 4.87 x10E6/uL (ref 3.77–5.28)
RDW: 13.5 % (ref 12.3–15.4)
WBC: 7.1 10*3/uL (ref 3.4–10.8)

## 2017-11-27 NOTE — Telephone Encounter (Signed)
LMTCB  Thanks,  -Ruston Fedora 

## 2017-11-27 NOTE — Telephone Encounter (Signed)
-----   Message from Virginia Crews, MD sent at 11/27/2017  8:13 AM EDT ----- Normal blood counts, liver function.  Kidney function has decreased since last checked about 11 months ago.  Potassium is also low.  Need to hold NSAIDs as kidneys.  We will also supplement with K-Dur 20 mEq daily (can send Rx for 30d supply).  Should have BMP rechecked in 2 weeks to see if this is improving.  Normal cholesterol.  Blood sugar is slightly elevated if the patient was fasting when these were taken.  We can check an A1c with her BMP in 1 month to see if she may be prediabetic.  Virginia Crews, MD, MPH [facility] 11/27/2017 8:13 AM

## 2017-11-28 MED ORDER — POTASSIUM CHLORIDE CRYS ER 20 MEQ PO TBCR: 20.0000 meq | EXTENDED_RELEASE_TABLET | Freq: Every day | ORAL | 0 refills | Status: DC

## 2017-11-28 NOTE — Telephone Encounter (Signed)
K-dur 20 mEq daily sent to pharmacy.

## 2017-11-28 NOTE — Telephone Encounter (Signed)
Patient advised as directed below. She wants her prescription to be send to [place]. Patient is aware that her BMP and A1C in a month.  Thanks,  -Navaeh Kehres

## 2017-12-06 ENCOUNTER — Encounter: Payer: Self-pay | Admitting: Gastroenterology

## 2017-12-13 ENCOUNTER — Telehealth: Payer: Self-pay | Admitting: Family Medicine

## 2017-12-13 DIAGNOSIS — E876 Hypokalemia: Secondary | ICD-10-CM

## 2017-12-13 DIAGNOSIS — R7309 Other abnormal glucose: Secondary | ICD-10-CM

## 2017-12-13 DIAGNOSIS — N289 Disorder of kidney and ureter, unspecified: Secondary | ICD-10-CM

## 2017-12-13 DIAGNOSIS — R7989 Other specified abnormal findings of blood chemistry: Secondary | ICD-10-CM

## 2017-12-13 NOTE — Telephone Encounter (Signed)
Pt advised she will need a BMP and Hgb A1c, so this does not need to be fasting. She is getting these labs done today.

## 2017-12-13 NOTE — Telephone Encounter (Signed)
Pt called wanting to know if she needs to be fasting when she comes back in for her repeat labs.    She may try to come in in the morning.  Pt's call back is   [phone]  She ask to leave a message because she may be sleeping  teri

## 2017-12-14 LAB — BASIC METABOLIC PANEL
BUN/Creatinine Ratio: 8 — ABNORMAL LOW (ref 9–23)
BUN: 10 mg/dL (ref 6–24)
CALCIUM: 9.7 mg/dL (ref 8.7–10.2)
CO2: 24 mmol/L (ref 20–29)
CREATININE: 1.29 mg/dL — AB (ref 0.57–1.00)
Chloride: 98 mmol/L (ref 96–106)
GFR calc Af Amer: 55 mL/min/{1.73_m2} — ABNORMAL LOW (ref >59)
GFR, EST NON AFRICAN AMERICAN: 47 mL/min/{1.73_m2} — AB (ref >59)
Glucose: 95 mg/dL (ref 65–99)
POTASSIUM: 3.4 mmol/L — AB (ref 3.5–5.2)
Sodium: 139 mmol/L (ref 134–144)

## 2017-12-14 LAB — HEMOGLOBIN A1C
ESTIMATED AVERAGE GLUCOSE: 105 mg/dL
Hgb A1c MFr Bld: 5.3 % (ref 4.8–5.6)

## 2017-12-18 ENCOUNTER — Encounter: Payer: Self-pay | Admitting: Family Medicine

## 2017-12-18 ENCOUNTER — Telehealth: Payer: Self-pay

## 2017-12-18 NOTE — Telephone Encounter (Signed)
See MyChart message; pt has reviewed these.

## 2017-12-18 NOTE — Telephone Encounter (Signed)
-----   Message from Virginia Crews, MD sent at 12/17/2017  2:16 PM EDT ----- Stable kidney function.  Low potassium.  Taking supplement?  Normal A1c - no diabetes.  Virginia Crews, MD, MPH [facility] 12/17/2017 2:16 PM

## 2017-12-19 ENCOUNTER — Other Ambulatory Visit: Payer: Self-pay | Admitting: Family Medicine

## 2017-12-26 ENCOUNTER — Ambulatory Visit (INDEPENDENT_AMBULATORY_CARE_PROVIDER_SITE_OTHER): Payer: 59 | Admitting: Gastroenterology

## 2017-12-26 ENCOUNTER — Encounter: Payer: Self-pay | Admitting: Gastroenterology

## 2017-12-26 VITALS — BP 122/85 | HR 80 | Ht 62.0 in | Wt 158.6 lb

## 2017-12-26 DIAGNOSIS — K5904 Chronic idiopathic constipation: Secondary | ICD-10-CM | POA: Diagnosis not present

## 2017-12-26 DIAGNOSIS — K219 Gastro-esophageal reflux disease without esophagitis: Secondary | ICD-10-CM | POA: Diagnosis not present

## 2017-12-26 DIAGNOSIS — R1013 Epigastric pain: Secondary | ICD-10-CM | POA: Diagnosis not present

## 2017-12-26 NOTE — Progress Notes (Signed)
Elizabeth Darby, MD [address]  [place]  [place], [place] [address]  Main: [phone]  Fax: [phone]    Gastroenterology Consultation  Referring Provider:     Virginia Crews, MD Primary Care Physician:  Virginia Crews, MD Primary Gastroenterologist:  Dr. Cephas Bowers Reason for Consultation:     GERD, epigastric pain, Bloating and constipation        HPI:   [place] is a 52 y.o. female referred by Dr. Brita Romp, [name], MD  for consultation & management of chronic GERD, bloating, epigastric pain and constipation  Chronic GERD: with erosive esophagitis Patient has been suffering from acid reflux for several years. She tried maximum doses of Prevacid, Protonix, Nexium, Prilosec and zegerid. When she found out about erosive esophagitis, she switched to Dexilant 60 mg twice daily which helped in healing of erosive esophagitis confirmed by EGD in 2018. She moved to [place] and changed her health insurance, therefore here to establish care with new GI. She is currently employed as a Statistician at [employer]. She reports that taking Dexilant 60 mg twice daily is keeping her symptoms under control.  Epigastric pain and bloating: these symptoms started when she turned 52 years old, around menopause. She figured that avoiding milk and wheat helps with bloating significantly. She tells me that whenever she eats a piece of bread or drinks milk, leads to severe bloating and upper abdominal discomfort that she doubles over from pain. She has eliminated wheat and diary as much as she could from her routine. It is unclear whether she was tested for celiac disease or H. Pylori infection although patient denies. She consumes red meat about 2 times a week, drinks Dr. Malachi Bonds 1-2 drinks daily to keep her up during night shifts. She does have ongoing mild epigastric discomfort and bloating  Chronic constipation: she is currently managed  with linaclotide 290 MCG daily and takes Colace additionally as needed. She thinks this medication is fairly keeping the constipation regulated. TSH normal. She reports that she gained significant weight in the process of trying different medications for fibromyalgia  Follow-up visit 12/26/2017 She started taking Topamax and phentermine for weight loss and lost about 10 pounds. She used to take this medication every day which resulted in rapid weight loss. Currently, taking once a week or so and losing about 1 pound per week. She continues to have symptoms of significant bloating, frequent burping, gas when she consumes even smallest amount of wheat. She tried to reintroduce a slice of bread few times a week but her symptoms got worse. she tried FD gaurd with no benefit, She is currently on Prevacid 30 mg twice daily which provides modest relief of heartburn and regurgitation. Her constipation is fairly regulated on linaclotide and Colace. She works as a Statistician at [employer], used to do night shifts. Currently, she'll be working during the day only. She has cut back on drinking Dr. Malachi Bonds. Her symptoms of fibromyalgia are fairly controlled on Cymbalta. She also has history of migraine headaches  NSAIDs: none  Antiplts/Anticoagulants/Anti thrombotics: none Family history of colon cancer Her sister passed away from colon cancer at age 4 Maternal uncles had colon cancer  GI Procedures:  EGD and colonoscopy 12/14/16 at [facility] Procedure report not available A.  GE junction, endoscopic biopsy: Squamocolumnar junction mucosa with no significant pathologic finding. Negative for goblet cell intestinal metaplasia.  B.  Cecal polyp, endoscopic polypectomy: Sessile serrated  adenoma. Negative for conventional dysplasia.  Past Medical History:  Diagnosis Date  . Fatty liver   . Fibromyalgia   . Hemorrhoids   . Hot flashes   . Hypertension   . IBS (irritable bowel  syndrome)   . Migraines   . Miscarriage    x 2; 4 live births     Past Surgical History:  Procedure Laterality Date  . ABDOMINAL HYSTERECTOMY     2012 removed cervix h/o abnormal pap   . CHOLECYSTECTOMY     2000  . COLONOSCOPY     2018 with + polpys and GIB 2/2 polyp removal   . ELBOW DEBRIDEMENT    . FOOT SURGERY      Current Outpatient Medications:  .  acyclovir ointment (ZOVIRAX) 5 %, Apply 1 application topically every 3 (three) hours. Prn cold sore, Disp: 30 g, Rfl: 0 .  budesonide (RHINOCORT ALLERGY) 32 MCG/ACT nasal spray, Place into both nostrils daily., Disp: , Rfl:  .  cyclobenzaprine (FLEXERIL) 10 MG tablet, Take 1 tablet (10 mg total) by mouth daily as needed for muscle spasms., Disp: 30 tablet, Rfl: 1 .  diazepam (VALIUM) 2 MG tablet, Take 1 tablet (2 mg total) by mouth daily as needed. for anxiety, Disp: 30 tablet, Rfl: 2 .  docusate sodium (COLACE) 100 MG capsule, Take 200 mg by mouth at bedtime. , Disp: , Rfl:  .  DULoxetine (CYMBALTA) 60 MG capsule, TAKE 1 CAPSULE (60 MG TOTAL) BY MOUTH DAILY., Disp: 30 capsule, Rfl: 1 .  HYDROcodone-ibuprofen (VICOPROFEN) 7.5-200 MG tablet, Take 1 tablet by mouth every 8 (eight) hours as needed for moderate pain., Disp: , Rfl:  .  lansoprazole (PREVACID) 30 MG capsule, , Disp: , Rfl: 2 .  levocetirizine (XYZAL) 5 MG tablet, , Disp: , Rfl: 12 .  LINZESS 290 MCG CAPS capsule, Take 1 capsule (290 mcg total) by mouth daily., Disp: 90 capsule, Rfl: 3 .  meclizine (ANTIVERT) 12.5 MG tablet, Take 1 tablet (12.5 mg total) by mouth 3 (three) times daily as needed for dizziness., Disp: 30 tablet, Rfl: 2 .  montelukast (SINGULAIR) 10 MG tablet, Take 1 tablet (10 mg total) by mouth at bedtime., Disp: 90 tablet, Rfl: 3 .  Multiple Vitamin (MULTIVITAMIN WITH MINERALS) TABS tablet, Take 1 tablet by mouth daily., Disp: , Rfl:  .  phentermine 37.5 MG capsule, Take 1 capsule (37.5 mg total) by mouth every morning., Disp: 30 capsule, Rfl: 2 .   potassium chloride SA (K-DUR,KLOR-CON) 20 MEQ tablet, Take 1 tablet (20 mEq total) by mouth daily., Disp: 30 tablet, Rfl: 0 .  promethazine (PHENERGAN) 25 MG tablet, Take 1 tablet (25 mg total) by mouth every 8 (eight) hours as needed for nausea or vomiting., Disp: 30 tablet, Rfl: 2 .  SUMAtriptan (IMITREX) 100 MG tablet, For ha take 1 pill may repeat another in 2 hrs as needed. Do not take more than 2 pills in 1 week, Disp: 10 tablet, Rfl: 2 .  topiramate (TOPAMAX) 50 MG tablet, Take 1 tablet (50 mg total) by mouth daily., Disp: 30 tablet, Rfl: 2 .  triamterene-hydrochlorothiazide (MAXZIDE-25) 37.5-25 MG tablet, Take 1 tablet by mouth daily., Disp: 90 tablet, Rfl: 0 .  valACYclovir (VALTREX) 500 MG tablet, Take 1 tablet (500 mg total) by mouth 2 (two) times daily. X 1 week prn, Disp: 60 tablet, Rfl: 1   Family History  Problem Relation Age of Onset  . Hypertension Mother   . Hyperlipidemia Mother   . Glaucoma Mother   .  Diabetes Mother        type 2   . COPD Father        emphysema  . Alcohol abuse Father   . Colon cancer Sister   . Cancer Sister        rectal/colon cancer no h/o IBD  . Colon polyps Sister   . Asthma Sister   . COPD Sister   . Kidney disease Sister   . Mental illness Sister        bipolar   . Miscarriages / Stillbirths Sister   . Uterine cancer Sister   . Alcohol abuse Son   . Depression Son   . Diabetes Son   . Drug abuse Son   . Mental illness Son        bipolar   . Diabetes Maternal Grandmother   . COPD Paternal Grandmother   . Cancer Paternal Grandfather        FH colon cancer maternal aunts/uncles   . Miscarriages / Stillbirths Sister   . Arthritis Son   . Arthritis Son      Social History   Tobacco Use  . Smoking status: Former Smoker    Packs/day: 0.75    Years: 5.00    Pack years: 3.75    Types: Cigarettes    Last attempt to quit: 07/06/1987    Years since quitting: 30.4  . Smokeless tobacco: Never Used  Substance Use Topics  . Alcohol  use: Yes    Alcohol/week: 1.2 - 2.4 oz    Types: 2 - 4 Cans of beer per week    Comment: wine cooler  . Drug use: Never    Allergies as of 12/26/2017 - Review Complete 12/26/2017  Allergen Reaction Noted  . Ranitidine Diarrhea and Other (See Comments) 08/30/2016  . Amphetamine-dextroamphetamine Other (See Comments) 05/10/2016  . Reglan [metoclopramide] Other (See Comments) 12/15/2016  . Treximet [sumatriptan-naproxen sodium]  11/12/2017  . Sulfa antibiotics Anxiety 12/15/2016    Review of Systems:    All systems reviewed and negative except where noted in HPI.   Physical Exam:  BP 122/85   Pulse 80   Ht 5\' 2"  (1.575 m)   Wt 158 lb 9.6 oz (71.9 kg)   BMI 29.01 kg/m  No LMP recorded. Patient has had a hysterectomy.  General:   Alert,  Well-developed, well-nourished, pleasant and cooperative in NAD Head:  Normocephalic and atraumatic. Eyes:  Sclera clear, no icterus.   Conjunctiva pink. Ears:  Normal auditory acuity. Nose:  No deformity, discharge, or lesions. Mouth:  No deformity or lesions,oropharynx pink & moist. Neck:  Supple; no masses or thyromegaly. Lungs:  Respirations even and unlabored.  Clear throughout to auscultation.   No wheezes, crackles, or rhonchi. No acute distress. Heart:  Regular rate and rhythm; no murmurs, clicks, rubs, or gallops. Abdomen:  Normal bowel sounds. Soft, nontender, mildly distended from bloating without masses, hepatosplenomegaly or hernias noted.  No guarding or rebound tenderness.   Rectal: Not performed Msk:  Symmetrical without gross deformities. Good, equal movement & strength bilaterally. Pulses:  Normal pulses noted. Extremities:  No clubbing or edema.  No cyanosis. Neurologic:  Alert and oriented x3;  grossly normal neurologically. Skin:  Intact without significant lesions or rashes. No jaundice. Psych:  Alert and cooperative. Normal mood and affect.  Imaging Studies: reviewed  Assessment and Plan:   Elizabeth Bowers is a 52  y.o. Caucasian female with family history of colon cancer in first-degree relative, chronic GERD with erosive  esophagitis, chronic dyspepsia and constipation. With her background history of fibromyalgia, migraines, anxiety her GI symptoms particularly symptoms of dyspepsia are probably functional  Chronic GERD with erosive esophagitis: Dexilant 60 mg twice daily is only medication that has resulted in resolution of her esophagitis Patient switched from [place] to Prevacid 30 mg twice daily with modest improvement in symptoms. I have reinforced to her about repeating EGD to evaluate for eosinophilic esophagitis before considering antireflux surgery. She prefers to have EGD performed in 02/2018 She also reports that she had pH impedance study performed several years ago and she was told that she is not a candidate for antireflux surgery  Chronic dyspepsia: There was no evidence of H. Pylori based on EGD in 09/2014 Her symptoms are most likely functional dyspepsia and she may have gluten sensitivity - EGD with duodenal biopsies to evaluate for celiac disease - will try to perform hydrogen breath test to evaluate for fructose intolerance - In the interim, I suggested her to try low FODMAPS diet, education material provided  Chronic constipation: - continue linaclotide at the current dose with Colace as needed - High fiber diet and fiber supplements  Colon cancer in first-degree relative < 38 years of age - Last colonoscopy in 12/2016 revealed sessile serrated adenoma in the cecum which was 1 cm in size - Recommend repeat colonoscopy in 3 years   Follow up in 4 weeks   Elizabeth Darby, MD

## 2018-01-02 DIAGNOSIS — N183 Chronic kidney disease, stage 3 (moderate): Secondary | ICD-10-CM | POA: Diagnosis not present

## 2018-01-02 DIAGNOSIS — G43719 Chronic migraine without aura, intractable, without status migrainosus: Secondary | ICD-10-CM | POA: Diagnosis not present

## 2018-01-02 DIAGNOSIS — M797 Fibromyalgia: Secondary | ICD-10-CM | POA: Diagnosis not present

## 2018-01-03 ENCOUNTER — Other Ambulatory Visit: Payer: Self-pay | Admitting: Family Medicine

## 2018-01-03 DIAGNOSIS — E876 Hypokalemia: Secondary | ICD-10-CM

## 2018-01-03 MED ORDER — POTASSIUM CHLORIDE CRYS ER 20 MEQ PO TBCR: 20.0000 meq | EXTENDED_RELEASE_TABLET | Freq: Every day | ORAL | 5 refills | Status: DC

## 2018-01-03 NOTE — Telephone Encounter (Signed)
[facility] pharmacy faxed a refill request for the following medication. Thanks CC  potassium chloride SA (K-DUR,KLOR-CON) 20 MEQ tablet

## 2018-01-07 ENCOUNTER — Telehealth: Payer: Self-pay

## 2018-01-07 ENCOUNTER — Other Ambulatory Visit: Payer: Self-pay | Admitting: Family Medicine

## 2018-01-07 DIAGNOSIS — G8929 Other chronic pain: Secondary | ICD-10-CM

## 2018-01-07 DIAGNOSIS — N183 Chronic kidney disease, stage 3 unspecified: Secondary | ICD-10-CM

## 2018-01-07 DIAGNOSIS — M797 Fibromyalgia: Secondary | ICD-10-CM

## 2018-01-07 DIAGNOSIS — M25561 Pain in right knee: Secondary | ICD-10-CM

## 2018-01-07 NOTE — Telephone Encounter (Signed)
I remember placing these but don't see them in chart review.  I re-placed the referrals  Elizabeth Bowers, [name], MD, MPH [facility] 01/07/2018 4:02 PM

## 2018-01-07 NOTE — Telephone Encounter (Signed)
Pt advised.

## 2018-01-07 NOTE — Telephone Encounter (Signed)
Patient called saying that she was supposed to be referred to nephrology, and Dr. Elwyn Lade office has not heard from [place]. She is requesting that we fax over last OV and labs to [phone] so the appt can be scheduled. Please advise when completed. Thanks!

## 2018-01-07 NOTE — Telephone Encounter (Signed)
Please review. Was referral placed?

## 2018-01-10 DIAGNOSIS — G43719 Chronic migraine without aura, intractable, without status migrainosus: Secondary | ICD-10-CM | POA: Insufficient documentation

## 2018-01-16 ENCOUNTER — Encounter: Payer: Self-pay | Admitting: Nurse Practitioner

## 2018-01-16 ENCOUNTER — Ambulatory Visit: Payer: 59 | Attending: Nurse Practitioner | Admitting: Nurse Practitioner

## 2018-01-16 ENCOUNTER — Other Ambulatory Visit: Payer: Self-pay

## 2018-01-16 DIAGNOSIS — M5441 Lumbago with sciatica, right side: Secondary | ICD-10-CM

## 2018-01-16 DIAGNOSIS — E669 Obesity, unspecified: Secondary | ICD-10-CM | POA: Insufficient documentation

## 2018-01-16 DIAGNOSIS — K219 Gastro-esophageal reflux disease without esophagitis: Secondary | ICD-10-CM | POA: Diagnosis not present

## 2018-01-16 DIAGNOSIS — M542 Cervicalgia: Secondary | ICD-10-CM | POA: Diagnosis not present

## 2018-01-16 DIAGNOSIS — M79602 Pain in left arm: Secondary | ICD-10-CM

## 2018-01-16 DIAGNOSIS — Z789 Other specified health status: Secondary | ICD-10-CM

## 2018-01-16 DIAGNOSIS — Z6828 Body mass index (BMI) 28.0-28.9, adult: Secondary | ICD-10-CM | POA: Diagnosis not present

## 2018-01-16 DIAGNOSIS — M79605 Pain in left leg: Secondary | ICD-10-CM

## 2018-01-16 DIAGNOSIS — M5442 Lumbago with sciatica, left side: Secondary | ICD-10-CM | POA: Diagnosis not present

## 2018-01-16 DIAGNOSIS — G894 Chronic pain syndrome: Secondary | ICD-10-CM

## 2018-01-16 DIAGNOSIS — Z79899 Other long term (current) drug therapy: Secondary | ICD-10-CM

## 2018-01-16 DIAGNOSIS — M79672 Pain in left foot: Secondary | ICD-10-CM | POA: Diagnosis not present

## 2018-01-16 DIAGNOSIS — M899 Disorder of bone, unspecified: Secondary | ICD-10-CM | POA: Diagnosis not present

## 2018-01-16 DIAGNOSIS — M79601 Pain in right arm: Secondary | ICD-10-CM

## 2018-01-16 DIAGNOSIS — I10 Essential (primary) hypertension: Secondary | ICD-10-CM | POA: Insufficient documentation

## 2018-01-16 DIAGNOSIS — Z87891 Personal history of nicotine dependence: Secondary | ICD-10-CM | POA: Diagnosis not present

## 2018-01-16 DIAGNOSIS — M79604 Pain in right leg: Secondary | ICD-10-CM | POA: Diagnosis not present

## 2018-01-16 DIAGNOSIS — G8929 Other chronic pain: Secondary | ICD-10-CM

## 2018-01-16 NOTE — Progress Notes (Addendum)
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 ablation Trigger point injections Diagnostic bilateral cervical facet nerve block Possible bilateral cervical facet radiofrequency ablation   Provider-requested follow-up: Return for 2nd Visit, w/ Dr. Dossie Arbour.  Future Appointments  Date Time Provider [place]  02/21/2018  8:40 AM Bacigalupo, [name], MD [facility] None    Primary Care Physician: Virginia Crews, MD Location: [facility] Note by:  Date: 01/16/2018; Time: 4:10 PM  Pain Score Disclaimer: We use the NRS-11 scale. This is a self-reported, subjective measurement of pain severity with only modest accuracy. It is used primarily to identify changes within a particular patient. It must be understood that outpatient pain scales are significantly less accurate that those used for research, where they can be applied under ideal controlled circumstances with minimal exposure to variables. In reality, the score is likely to be a  combination of pain intensity and pain affect, where pain affect describes the degree of emotional arousal or changes in action readiness caused  by the sensory experience of pain. Factors such as social and work situation, setting, emotional state, anxiety levels, expectation, and prior pain experience may influence pain perception and show large inter-individual differences that may also be affected by time variables.  Patient instructions provided during this appointment: Patient Instructions   ____________________________________________________________________________________________  Appointment Policy Summary  It is our goal and responsibility to provide the medical community with assistance in the evaluation and management of patients with chronic pain. Unfortunately our resources are limited. Because we do not have an unlimited amount of time, or available appointments, we are required to closely monitor and manage their use. The following rules exist to maximize their use:  Patient's responsibilities: 1. Punctuality:  At what time should I arrive? You should be physically present in our office 30 minutes before your scheduled appointment. Your scheduled appointment is with your assigned healthcare provider. However, it takes 5-10 minutes to be "checked-in", and another 15 minutes for the nurses to do the admission. If you arrive to our office at the time you were given for your appointment, you will end up being at least 20-25 minutes late to your appointment with the provider. 2. Tardiness:  What happens if I arrive only a few minutes after my scheduled appointment time? You will need to reschedule your appointment. The cutoff is your appointment time. This is why it is so important that you arrive at least 30 minutes before that appointment. If you have an appointment scheduled for 10:00 AM and you arrive at 10:01, you will be required to reschedule your appointment.  3. Plan ahead:   Always assume that you will encounter traffic on your way in. Plan for it. If you are dependent on a driver, make sure they understand these rules and the need to arrive early. 4. Other appointments and responsibilities:  Avoid scheduling any other appointments before or after your pain clinic appointments.  5. Be prepared:  Write down everything that you need to discuss with your healthcare provider and give this information to the admitting nurse. Write down the medications that you will need refilled. Bring your pills and bottles (even the empty ones), to all of your appointments, except for those where a procedure is scheduled. 6. No children or pets:  Find someone to take care of them. It is not appropriate to bring them in. 7. Scheduling changes:  We request "advanced notification" of any changes or cancellations. 8. Advanced notification:  Defined as a time period of more than 24 hours prior to the originally scheduled appointment. This allows for the appointment to be offered to other patients. 9. Rescheduling:  When a visit is rescheduled, it will require the cancellation of the original appointment. For this reason they both fall within the category of "Cancellations".  10. Cancellations:  They require advanced notification. Any cancellation less than 24 hours before the  appointment will be recorded as a "No Show". 11. No Show:  Defined as an unkept appointment where the patient failed to notify or declare to the practice their intention or inability to keep the appointment.  Corrective process for repeat offenders:  1. Tardiness: Three (3) episodes of rescheduling due to late arrivals will be recorded as one (1) "No Show". 2. Cancellation or reschedule: Three (3) cancellations or rescheduling will be recorded as one (1) "No Show". 3. "No Shows": Three (3) "No Shows" within a 12 month period will result in discharge from the  practice. ____________________________________________________________________________________________  ____________________________________________________________________________________________  Pain Scale  Introduction:  The pain score used by this practice is the Verbal Numerical Rating Scale (VNRS-11). This is an 11-point scale. It is for adults and children 10 years or older. There are significant differences in how the pain score is reported, used, and applied. Forget everything you learned in the past and learn this scoring system.  General Information: The scale should reflect your current level of pain. Unless you are specifically asked for the level of your worst pain, or your average pain. If you are asked for one of these two, then it should be understood that it is over the past 24 hours.  Basic Activities of Daily Living (ADL): Personal hygiene, dressing, eating, transferring, and using restroom.  Instructions: Most patients tend to report their level of pain as a combination of two factors, their physical pain and their psychosocial pain. This last one is also known as "suffering" and it is reflection of how physical pain affects you socially and psychologically. From now on, report them separately. From this point on, when asked to report your pain level, report only your physical pain. Use the following table for reference.  Pain Clinic Pain Levels (0-5/10)  Pain Level Score  Description  No Pain 0   Mild pain 1 Nagging, annoying, but does not interfere with basic activities of daily living (ADL). Patients are able to eat, bathe, get dressed, toileting (being able to get on and off the toilet and perform personal hygiene functions), transfer (move in and out of bed or a chair without assistance), and maintain continence (able to control bladder and bowel functions). Blood pressure and heart rate are unaffected. A normal heart rate for a healthy adult ranges from 60 to 100 bpm  (beats per minute).   Mild to moderate pain 2 Noticeable and distracting. Impossible to hide from other people. More frequent flare-ups. Still possible to adapt and function close to normal. It can be very annoying and may have occasional stronger flare-ups. With discipline, patients may get used to it and adapt.   Moderate pain 3 Interferes significantly with activities of daily living (ADL). It becomes difficult to feed, bathe, get dressed, get on and off the toilet or to perform personal hygiene functions. Difficult to get in and out of bed or a chair without assistance. Very distracting. With effort, it can be ignored when deeply involved in activities.   Moderately severe pain 4 Impossible to ignore for more than a few minutes. With effort, patients may still be able to manage work or participate in some social activities. Very difficult to concentrate. Signs of autonomic nervous system discharge are evident: dilated pupils (mydriasis); mild sweating (diaphoresis); sleep interference. Heart rate becomes elevated (>115 bpm). Diastolic blood pressure (lower number) rises above 100 mmHg. Patients find relief in laying down and not moving.   Severe pain 5 Intense and extremely unpleasant. Associated with frowning face and frequent crying. Pain overwhelms the senses.  Ability to do any activity or maintain social relationships becomes significantly limited. Conversation becomes difficult. Pacing back and forth is common, as getting into a comfortable position is nearly impossible. Pain wakes you up from deep sleep. Physical signs will be obvious: pupillary dilation; increased sweating; goosebumps; brisk reflexes; cold, clammy hands and feet; nausea, vomiting or dry heaves; loss of appetite; significant sleep disturbance with inability to fall asleep or to remain asleep. When persistent, significant weight loss is observed due to the complete loss of appetite and sleep deprivation.  Blood pressure and heart  rate becomes  significantly elevated. Caution: If elevated blood pressure triggers a pounding headache associated with blurred vision, then the patient should immediately seek attention at an urgent or emergency care unit, as these may be signs of an impending stroke.    Emergency Department Pain Levels (6-10/10)  Emergency Room Pain 6 Severely limiting. Requires emergency care and should not be seen or managed at an outpatient pain management facility. Communication becomes difficult and requires great effort. Assistance to reach the emergency department may be required. Facial flushing and profuse sweating along with potentially dangerous increases in heart rate and blood pressure will be evident.   Distressing pain 7 Self-care is very difficult. Assistance is required to transport, or use restroom. Assistance to reach the emergency department will be required. Tasks requiring coordination, such as bathing and getting dressed become very difficult.   Disabling pain 8 Self-care is no longer possible. At this level, pain is disabling. The individual is unable to do even the most "basic" activities such as walking, eating, bathing, dressing, transferring to a bed, or toileting. Fine motor skills are lost. It is difficult to think clearly.   Incapacitating pain 9 Pain becomes incapacitating. Thought processing is no longer possible. Difficult to remember your own name. Control of movement and coordination are lost.   The worst pain imaginable 10 At this level, most patients pass out from pain. When this level is reached, collapse of the autonomic nervous system occurs, leading to a sudden drop in blood pressure and heart rate. This in turn results in a temporary and dramatic drop in blood flow to the brain, leading to a loss of consciousness. Fainting is one of the body's self defense mechanisms. Passing out puts the brain in a calmed state and causes it to shut down for a while, in order to begin the  healing process.    Summary: 1. Refer to this scale when providing [place] with your pain level. 2. Be accurate and careful when reporting your pain level. This will help with your care. 3. Over-reporting your pain level will lead to loss of credibility. 4. Even a level of 1/10 means that there is pain and will be treated at our facility. 5. High, inaccurate reporting will be documented as "Symptom Exaggeration", leading to loss of credibility and suspicions of possible secondary gains such as obtaining more narcotics, or wanting to appear disabled, for fraudulent reasons. 6. Only pain levels of 5 or below will be seen at our facility. 7. Pain levels of 6 and above will be sent to the Emergency Department and the appointment cancelled. ____________________________________________________________________________________________   Follow up with x-rays

## 2018-01-16 NOTE — Patient Instructions (Addendum)
____________________________________________________________________________________________  Appointment Policy Summary  It is our goal and responsibility to provide the medical community with assistance in the evaluation and management of patients with chronic pain. Unfortunately our resources are limited. Because we do not have an unlimited amount of time, or available appointments, we are required to closely monitor and manage their use. The following rules exist to maximize their use:  Patient's responsibilities: 1. Punctuality:  At what time should I arrive? You should be physically present in our office 30 minutes before your scheduled appointment. Your scheduled appointment is with your assigned healthcare provider. However, it takes 5-10 minutes to be "checked-in", and another 15 minutes for the nurses to do the admission. If you arrive to our office at the time you were given for your appointment, you will end up being at least 20-25 minutes late to your appointment with the provider. 2. Tardiness:  What happens if I arrive only a few minutes after my scheduled appointment time? You will need to reschedule your appointment. The cutoff is your appointment time. This is why it is so important that you arrive at least 30 minutes before that appointment. If you have an appointment scheduled for 10:00 AM and you arrive at 10:01, you will be required to reschedule your appointment.  3. Plan ahead:  Always assume that you will encounter traffic on your way in. Plan for it. If you are dependent on a driver, make sure they understand these rules and the need to arrive early. 4. Other appointments and responsibilities:  Avoid scheduling any other appointments before or after your pain clinic appointments.  5. Be prepared:  Write down everything that you need to discuss with your healthcare provider and give this information to the admitting nurse. Write down the medications that you will need  refilled. Bring your pills and bottles (even the empty ones), to all of your appointments, except for those where a procedure is scheduled. 6. No children or pets:  Find someone to take care of them. It is not appropriate to bring them in. 7. Scheduling changes:  We request "advanced notification" of any changes or cancellations. 8. Advanced notification:  Defined as a time period of more than 24 hours prior to the originally scheduled appointment. This allows for the appointment to be offered to other patients. 9. Rescheduling:  When a visit is rescheduled, it will require the cancellation of the original appointment. For this reason they both fall within the category of "Cancellations".  10. Cancellations:  They require advanced notification. Any cancellation less than 24 hours before the  appointment will be recorded as a "No Show". 11. No Show:  Defined as an unkept appointment where the patient failed to notify or declare to the practice their intention or inability to keep the appointment.  Corrective process for repeat offenders:  1. Tardiness: Three (3) episodes of rescheduling due to late arrivals will be recorded as one (1) "No Show". 2. Cancellation or reschedule: Three (3) cancellations or rescheduling will be recorded as one (1) "No Show". 3. "No Shows": Three (3) "No Shows" within a 12 month period will result in discharge from the practice. ____________________________________________________________________________________________  ____________________________________________________________________________________________  Pain Scale  Introduction: The pain score used by this practice is the Verbal Numerical Rating Scale (VNRS-11). This is an 11-point scale. It is for adults and children 10 years or older. There are significant differences in how the pain score is reported, used, and applied. Forget everything you learned in the past and learn  this scoring system.  General  Information: The scale should reflect your current level of pain. Unless you are specifically asked for the level of your worst pain, or your average pain. If you are asked for one of these two, then it should be understood that it is over the past 24 hours.  Basic Activities of Daily Living (ADL): Personal hygiene, dressing, eating, transferring, and using restroom.  Instructions: Most patients tend to report their level of pain as a combination of two factors, their physical pain and their psychosocial pain. This last one is also known as "suffering" and it is reflection of how physical pain affects you socially and psychologically. From now on, report them separately. From this point on, when asked to report your pain level, report only your physical pain. Use the following table for reference.  Pain Clinic Pain Levels (0-5/10)  Pain Level Score  Description  No Pain 0   Mild pain 1 Nagging, annoying, but does not interfere with basic activities of daily living (ADL). Patients are able to eat, bathe, get dressed, toileting (being able to get on and off the toilet and perform personal hygiene functions), transfer (move in and out of bed or a chair without assistance), and maintain continence (able to control bladder and bowel functions). Blood pressure and heart rate are unaffected. A normal heart rate for a healthy adult ranges from 60 to 100 bpm (beats per minute).   Mild to moderate pain 2 Noticeable and distracting. Impossible to hide from other people. More frequent flare-ups. Still possible to adapt and function close to normal. It can be very annoying and may have occasional stronger flare-ups. With discipline, patients may get used to it and adapt.   Moderate pain 3 Interferes significantly with activities of daily living (ADL). It becomes difficult to feed, bathe, get dressed, get on and off the toilet or to perform personal hygiene functions. Difficult to get in and out of bed or a chair  without assistance. Very distracting. With effort, it can be ignored when deeply involved in activities.   Moderately severe pain 4 Impossible to ignore for more than a few minutes. With effort, patients may still be able to manage work or participate in some social activities. Very difficult to concentrate. Signs of autonomic nervous system discharge are evident: dilated pupils (mydriasis); mild sweating (diaphoresis); sleep interference. Heart rate becomes elevated (>115 bpm). Diastolic blood pressure (lower number) rises above 100 mmHg. Patients find relief in laying down and not moving.   Severe pain 5 Intense and extremely unpleasant. Associated with frowning face and frequent crying. Pain overwhelms the senses.  Ability to do any activity or maintain social relationships becomes significantly limited. Conversation becomes difficult. Pacing back and forth is common, as getting into a comfortable position is nearly impossible. Pain wakes you up from deep sleep. Physical signs will be obvious: pupillary dilation; increased sweating; goosebumps; brisk reflexes; cold, clammy hands and feet; nausea, vomiting or dry heaves; loss of appetite; significant sleep disturbance with inability to fall asleep or to remain asleep. When persistent, significant weight loss is observed due to the complete loss of appetite and sleep deprivation.  Blood pressure and heart rate becomes significantly elevated. Caution: If elevated blood pressure triggers a pounding headache associated with blurred vision, then the patient should immediately seek attention at an urgent or emergency care unit, as these may be signs of an impending stroke.    Emergency Department Pain Levels (6-10/10)  Emergency Room Pain 6 Severely   limiting. Requires emergency care and should not be seen or managed at an outpatient pain management facility. Communication becomes difficult and requires great effort. Assistance to reach the emergency department  may be required. Facial flushing and profuse sweating along with potentially dangerous increases in heart rate and blood pressure will be evident.   Distressing pain 7 Self-care is very difficult. Assistance is required to transport, or use restroom. Assistance to reach the emergency department will be required. Tasks requiring coordination, such as bathing and getting dressed become very difficult.   Disabling pain 8 Self-care is no longer possible. At this level, pain is disabling. The individual is unable to do even the most "basic" activities such as walking, eating, bathing, dressing, transferring to a bed, or toileting. Fine motor skills are lost. It is difficult to think clearly.   Incapacitating pain 9 Pain becomes incapacitating. Thought processing is no longer possible. Difficult to remember your own name. Control of movement and coordination are lost.   The worst pain imaginable 10 At this level, most patients pass out from pain. When this level is reached, collapse of the autonomic nervous system occurs, leading to a sudden drop in blood pressure and heart rate. This in turn results in a temporary and dramatic drop in blood flow to the brain, leading to a loss of consciousness. Fainting is one of the body's self defense mechanisms. Passing out puts the brain in a calmed state and causes it to shut down for a while, in order to begin the healing process.    Summary: 1. Refer to this scale when providing [place] with your pain level. 2. Be accurate and careful when reporting your pain level. This will help with your care. 3. Over-reporting your pain level will lead to loss of credibility. 4. Even a level of 1/10 means that there is pain and will be treated at our facility. 5. High, inaccurate reporting will be documented as "Symptom Exaggeration", leading to loss of credibility and suspicions of possible secondary gains such as obtaining more narcotics, or wanting to appear disabled, for  fraudulent reasons. 6. Only pain levels of 5 or below will be seen at our facility. 7. Pain levels of 6 and above will be sent to the Emergency Department and the appointment cancelled. ____________________________________________________________________________________________   Follow up with x-rays

## 2018-01-16 NOTE — Progress Notes (Signed)
Safety precautions to be maintained throughout the outpatient stay will include: orient to surroundings, keep bed in low position, maintain call bell within reach at all times, provide assistance with transfer out of bed and ambulation.  

## 2018-01-21 LAB — COMPLIANCE DRUG ANALYSIS, UR

## 2018-01-22 ENCOUNTER — Ambulatory Visit
Admission: RE | Admit: 2018-01-22 | Discharge: 2018-01-22 | Disposition: A | Discharge status: Home / Self Care | Payer: 59 | Source: Ambulatory Visit | Attending: Nurse Practitioner | Admitting: Nurse Practitioner

## 2018-01-22 DIAGNOSIS — M50322 Other cervical disc degeneration at C5-C6 level: Secondary | ICD-10-CM | POA: Diagnosis not present

## 2018-01-22 DIAGNOSIS — G8929 Other chronic pain: Secondary | ICD-10-CM | POA: Insufficient documentation

## 2018-01-22 DIAGNOSIS — M542 Cervicalgia: Secondary | ICD-10-CM | POA: Diagnosis not present

## 2018-01-22 DIAGNOSIS — M5137 Other intervertebral disc degeneration, lumbosacral region: Secondary | ICD-10-CM | POA: Insufficient documentation

## 2018-01-22 DIAGNOSIS — M79672 Pain in left foot: Secondary | ICD-10-CM

## 2018-01-22 DIAGNOSIS — M5442 Lumbago with sciatica, left side: Secondary | ICD-10-CM

## 2018-01-22 DIAGNOSIS — M19072 Primary osteoarthritis, left ankle and foot: Secondary | ICD-10-CM | POA: Diagnosis not present

## 2018-01-22 DIAGNOSIS — M4802 Spinal stenosis, cervical region: Secondary | ICD-10-CM | POA: Diagnosis not present

## 2018-01-22 DIAGNOSIS — M5441 Lumbago with sciatica, right side: Secondary | ICD-10-CM

## 2018-01-22 LAB — 25-HYDROXYVITAMIN D LCMS D2+D3: 25-HYDROXY, VITAMIN D: 51 ng/mL

## 2018-01-22 LAB — VITAMIN B12: Vitamin B-12: 1137 pg/mL (ref 232–1245)

## 2018-01-22 LAB — SEDIMENTATION RATE: SED RATE: 16 mm/h (ref 0–40)

## 2018-01-22 LAB — COMP. METABOLIC PANEL (12)
A/G RATIO: 1.8 (ref 1.2–2.2)
AST: 21 IU/L (ref 0–40)
Albumin: 4.5 g/dL (ref 3.5–5.5)
Alkaline Phosphatase: 82 IU/L (ref 39–117)
BUN/Creatinine Ratio: 9 (ref 9–23)
BUN: 11 mg/dL (ref 6–24)
Bilirubin Total: 0.4 mg/dL (ref 0.0–1.2)
CALCIUM: 9.7 mg/dL (ref 8.7–10.2)
CHLORIDE: 100 mmol/L (ref 96–106)
CREATININE: 1.22 mg/dL — AB (ref 0.57–1.00)
GFR, EST AFRICAN AMERICAN: 58 mL/min/{1.73_m2} — AB (ref >59)
GFR, EST NON AFRICAN AMERICAN: 51 mL/min/{1.73_m2} — AB (ref >59)
GLUCOSE: 79 mg/dL (ref 65–99)
Globulin, Total: 2.5 g/dL (ref 1.5–4.5)
Potassium: 3.7 mmol/L (ref 3.5–5.2)
Sodium: 143 mmol/L (ref 134–144)
TOTAL PROTEIN: 7 g/dL (ref 6.0–8.5)

## 2018-01-22 LAB — MAGNESIUM: Magnesium: 2.2 mg/dL (ref 1.6–2.3)

## 2018-01-22 LAB — 25-HYDROXY VITAMIN D LCMS D2+D3: 25-Hydroxy, Vitamin D-3: 50 ng/mL

## 2018-01-22 LAB — C-REACTIVE PROTEIN: CRP: 8 mg/L (ref 0–10)

## 2018-01-23 NOTE — Progress Notes (Signed)
Results were reviewed and found to be: abnormal, but not significant  No acute injury or pathology identified  Review would suggest interventional pain management techniques may be of benefit

## 2018-01-23 NOTE — Progress Notes (Signed)
Results were reviewed and found to be: mildly abnormal  No acute injury or pathology identified  Review would suggest interventional pain management techniques may be of benefit 

## 2018-01-25 ENCOUNTER — Telehealth: Payer: Self-pay

## 2018-01-25 NOTE — Telephone Encounter (Signed)
Patient states she will call us back to schedule her EGD  When she gets her September work schedule.  Dx: Chronic GERD, Dyspepsia.  Thanks [employer]

## 2018-01-25 NOTE — Telephone Encounter (Signed)
-----   Message from Lin Landsman, MD sent at 12/26/2017  4:12 PM EDT ----- Regarding: schedule EGD Please schedule EGD for her and she prefers to have it done in September  Diagnosis: chronic GERD and dyspepsia  Thanks [employer]

## 2018-01-30 ENCOUNTER — Other Ambulatory Visit: Payer: Self-pay | Admitting: Gastroenterology

## 2018-01-31 ENCOUNTER — Other Ambulatory Visit: Payer: Self-pay

## 2018-01-31 DIAGNOSIS — K219 Gastro-esophageal reflux disease without esophagitis: Secondary | ICD-10-CM

## 2018-01-31 DIAGNOSIS — R1013 Epigastric pain: Secondary | ICD-10-CM

## 2018-02-05 ENCOUNTER — Encounter: Payer: Self-pay | Admitting: *Deleted

## 2018-02-05 ENCOUNTER — Other Ambulatory Visit: Payer: Self-pay

## 2018-02-05 NOTE — Discharge Instructions (Signed)
General Anesthesia, Adult, Care After °These instructions provide you with information about caring for yourself after your procedure. Your health care provider may also give you more specific instructions. Your treatment has been planned according to current medical practices, but problems sometimes occur. Call your health care provider if you have any problems or questions after your procedure. °What can I expect after the procedure? °After the procedure, it is common to have: °· Vomiting. °· A sore throat. °· Mental slowness. ° °It is common to feel: °· Nauseous. °· Cold or shivery. °· Sleepy. °· Tired. °· Sore or achy, even in parts of your body where you did not have surgery. ° °Follow these instructions at home: °For at least 24 hours after the procedure: °· Do not: °? Participate in activities where you could fall or become injured. °? Drive. °? Use heavy machinery. °? Drink alcohol. °? Take sleeping pills or medicines that cause drowsiness. °? Make important decisions or sign legal documents. °? Take care of children on your own. °· Rest. °Eating and drinking °· If you vomit, drink water, juice, or soup when you can drink without vomiting. °· Drink enough fluid to keep your urine clear or pale yellow. °· Make sure you have little or no nausea before eating solid foods. °· Follow the diet recommended by your health care provider. °General instructions °· Have a responsible adult stay with you until you are awake and alert. °· Return to your normal activities as told by your health care provider. Ask your health care provider what activities are safe for you. °· Take over-the-counter and prescription medicines only as told by your health care provider. °· If you smoke, do not smoke without supervision. °· Keep all follow-up visits as told by your health care provider. This is important. °Contact a health care provider if: °· You continue to have nausea or vomiting at home, and medicines are not helpful. °· You  cannot drink fluids or start eating again. °· You cannot urinate after 8-12 hours. °· You develop a skin rash. °· You have fever. °· You have increasing redness at the site of your procedure. °Get help right away if: °· You have difficulty breathing. °· You have chest pain. °· You have unexpected bleeding. °· You feel that you are having a life-threatening or urgent problem. °This information is not intended to replace advice given to you by your health care provider. Make sure you discuss any questions you have with your health care provider. °Document Released: 08/28/2000 Document Revised: 10/25/2015 Document Reviewed: 05/06/2015 °Elsevier Interactive Patient Education © 2018 [employer] ° °

## 2018-02-06 ENCOUNTER — Ambulatory Visit: Payer: 59 | Admitting: Anesthesiology

## 2018-02-06 ENCOUNTER — Encounter
Admission: RE | Disposition: A | Discharge status: Home / Self Care | Payer: Self-pay | Source: Ambulatory Visit | Attending: Gastroenterology

## 2018-02-06 ENCOUNTER — Ambulatory Visit
Admission: RE | Admit: 2018-02-06 | Discharge: 2018-02-06 | Disposition: A | Discharge status: Home / Self Care | Payer: 59 | Source: Ambulatory Visit | Attending: Gastroenterology | Admitting: Gastroenterology

## 2018-02-06 DIAGNOSIS — K210 Gastro-esophageal reflux disease with esophagitis: Secondary | ICD-10-CM | POA: Diagnosis not present

## 2018-02-06 DIAGNOSIS — Z79899 Other long term (current) drug therapy: Secondary | ICD-10-CM | POA: Insufficient documentation

## 2018-02-06 DIAGNOSIS — K219 Gastro-esophageal reflux disease without esophagitis: Secondary | ICD-10-CM | POA: Insufficient documentation

## 2018-02-06 DIAGNOSIS — R12 Heartburn: Secondary | ICD-10-CM | POA: Diagnosis not present

## 2018-02-06 DIAGNOSIS — M797 Fibromyalgia: Secondary | ICD-10-CM | POA: Insufficient documentation

## 2018-02-06 DIAGNOSIS — G43719 Chronic migraine without aura, intractable, without status migrainosus: Secondary | ICD-10-CM | POA: Diagnosis not present

## 2018-02-06 DIAGNOSIS — K2950 Unspecified chronic gastritis without bleeding: Secondary | ICD-10-CM | POA: Insufficient documentation

## 2018-02-06 DIAGNOSIS — K760 Fatty (change of) liver, not elsewhere classified: Secondary | ICD-10-CM | POA: Diagnosis not present

## 2018-02-06 DIAGNOSIS — I10 Essential (primary) hypertension: Secondary | ICD-10-CM | POA: Insufficient documentation

## 2018-02-06 DIAGNOSIS — R1013 Epigastric pain: Secondary | ICD-10-CM

## 2018-02-06 DIAGNOSIS — Z87891 Personal history of nicotine dependence: Secondary | ICD-10-CM | POA: Insufficient documentation

## 2018-02-06 DIAGNOSIS — K589 Irritable bowel syndrome without diarrhea: Secondary | ICD-10-CM | POA: Diagnosis not present

## 2018-02-06 DIAGNOSIS — Z888 Allergy status to other drugs, medicaments and biological substances status: Secondary | ICD-10-CM | POA: Diagnosis not present

## 2018-02-06 DIAGNOSIS — Z882 Allergy status to sulfonamides status: Secondary | ICD-10-CM | POA: Diagnosis not present

## 2018-02-06 DIAGNOSIS — R197 Diarrhea, unspecified: Secondary | ICD-10-CM | POA: Diagnosis not present

## 2018-02-06 DIAGNOSIS — K2930 Chronic superficial gastritis without bleeding: Secondary | ICD-10-CM | POA: Diagnosis not present

## 2018-02-06 HISTORY — DX: Other diseases of vocal cords: J38.3

## 2018-02-06 HISTORY — DX: Gastro-esophageal reflux disease without esophagitis: K21.9

## 2018-02-06 HISTORY — DX: Dorsopathy, unspecified: M53.9

## 2018-02-06 HISTORY — PX: ESOPHAGOGASTRODUODENOSCOPY (EGD) WITH PROPOFOL: SHX5813

## 2018-02-06 SURGERY — ESOPHAGOGASTRODUODENOSCOPY (EGD) WITH PROPOFOL
Anesthesia: General | Wound class: Clean Contaminated

## 2018-02-06 MED ORDER — ACETAMINOPHEN 325 MG PO TABS
650.0000 mg | ORAL_TABLET | Freq: Once | ORAL | Status: AC
  Administered 2018-02-06: 650 mg via ORAL

## 2018-02-06 MED ORDER — GLYCOPYRROLATE 0.2 MG/ML IJ SOLN
INTRAMUSCULAR | Status: DC | PRN
  Administered 2018-02-06: 0.2 mg via INTRAVENOUS

## 2018-02-06 MED ORDER — DEXMEDETOMIDINE HCL 200 MCG/2ML IV SOLN
INTRAVENOUS | Status: DC | PRN
  Administered 2018-02-06: 8 ug via INTRAVENOUS

## 2018-02-06 MED ORDER — LIDOCAINE HCL (CARDIAC) PF 100 MG/5ML IV SOSY
PREFILLED_SYRINGE | INTRAVENOUS | Status: DC | PRN
  Administered 2018-02-06: 40 mg via INTRAVENOUS

## 2018-02-06 MED ORDER — PROPOFOL 10 MG/ML IV BOLUS
INTRAVENOUS | Status: DC | PRN
  Administered 2018-02-06: 30 mg via INTRAVENOUS
  Administered 2018-02-06 (×2): 20 mg via INTRAVENOUS
  Administered 2018-02-06: 100 mg via INTRAVENOUS
  Administered 2018-02-06: 30 mg via INTRAVENOUS
  Administered 2018-02-06: 20 mg via INTRAVENOUS
  Administered 2018-02-06: 30 mg via INTRAVENOUS

## 2018-02-06 MED ORDER — LACTATED RINGERS IV SOLN
INTRAVENOUS | Status: DC
  Administered 2018-02-06: 09:00:00 via INTRAVENOUS

## 2018-02-06 MED ORDER — SODIUM CHLORIDE 0.9 % IV SOLN: INTRAVENOUS | Status: DC

## 2018-02-06 SURGICAL SUPPLY — 26 items
BALLN DILATOR 10-12 8 (BALLOONS)
BALLN DILATOR 12-15 8 (BALLOONS)
BALLN DILATOR 15-18 8 (BALLOONS)
BALLN DILATOR CRE 0-12 8 (BALLOONS)
BALLN DILATOR ESOPH 8 10 CRE (MISCELLANEOUS) IMPLANT
BALLOON DILATOR 12-15 8 (BALLOONS) IMPLANT
BALLOON DILATOR 15-18 8 (BALLOONS) IMPLANT
BALLOON DILATOR CRE 0-12 8 (BALLOONS) IMPLANT
BLOCK BITE 60FR ADLT L/F GRN (MISCELLANEOUS) ×3 IMPLANT
CANISTER SUCT 1200ML W/VALVE (MISCELLANEOUS) ×3 IMPLANT
CLIP HMST 235XBRD CATH ROT (MISCELLANEOUS) IMPLANT
CLIP RESOLUTION 360 11X235 (MISCELLANEOUS)
ELECT REM PT RETURN 9FT ADLT (ELECTROSURGICAL)
ELECTRODE REM PT RTRN 9FT ADLT (ELECTROSURGICAL) IMPLANT
FCP ESCP3.2XJMB 240X2.8X (MISCELLANEOUS)
FORCEPS BIOP RAD 4 LRG CAP 4 (CUTTING FORCEPS) ×3 IMPLANT
FORCEPS BIOP RJ4 240 W/NDL (MISCELLANEOUS)
FORCEPS ESCP3.2XJMB 240X2.8X (MISCELLANEOUS) IMPLANT
GOWN CVR UNV OPN BCK APRN NK (MISCELLANEOUS) ×2 IMPLANT
GOWN ISOL THUMB LOOP REG UNIV (MISCELLANEOUS) ×4
KIT DEFENDO VALVE AND CONN (KITS) IMPLANT
KIT ENDO PROCEDURE OLY (KITS) ×3 IMPLANT
MARKER SPOT ENDO TATTOO 5ML (MISCELLANEOUS) IMPLANT
SNARE SHORT THROW 13M SML OVAL (MISCELLANEOUS) IMPLANT
SPOT EX ENDOSCOPIC TATTOO (MISCELLANEOUS)
WATER STERILE IRR 250ML POUR (IV SOLUTION) ×3 IMPLANT

## 2018-02-06 NOTE — Op Note (Signed)
[facility] Gastroenterology Patient Name: Elizabeth Bowers Procedure Date: 02/06/2018 9:43 AM MRN: [id number] Account #: [id number] Date of Birth: February 09, 1966 Admit Type: Outpatient Age: 52 Room: [facility] OR ROOM 01 Gender: Female Note Status: Finalized Procedure:            Upper GI endoscopy Indications:          Dyspepsia, Heartburn Providers:            Lin Landsman MD, MD Referring MD:         [name]. Bacigalupo (Referring MD) Medicines:            Monitored Anesthesia Care Complications:        No immediate complications. Estimated blood loss: None. Procedure:            Pre-Anesthesia Assessment:                       - Prior to the procedure, a History and Physical was                        performed, and patient medications and allergies were                        reviewed. The patient is competent. The risks and                        benefits of the procedure and the sedation options and                        risks were discussed with the patient. All questions                        were answered and informed consent was obtained.                        Patient identification and proposed procedure were                        verified by the physician, the nurse, the                        anesthesiologist, the anesthetist and the technician in                        the pre-procedure area in the procedure room in the                        endoscopy suite. Mental Status Examination: alert and                        oriented. Airway Examination: normal oropharyngeal                        airway and neck mobility. Respiratory Examination:                        clear to auscultation. CV Examination: normal.                        Prophylactic Antibiotics: The patient does not require  prophylactic antibiotics. Prior Anticoagulants: The                        patient has taken no previous anticoagulant or   antiplatelet agents. ASA Grade Assessment: II - A                        patient with mild systemic disease. After reviewing the                        risks and benefits, the patient was deemed in                        satisfactory condition to undergo the procedure. The                        anesthesia plan was to use monitored anesthesia care                        (MAC). Immediately prior to administration of                        medications, the patient was re-assessed for adequacy                        to receive sedatives. The heart rate, respiratory rate,                        oxygen saturations, blood pressure, adequacy of                        pulmonary ventilation, and response to care were                        monitored throughout the procedure. The physical status                        of the patient was re-assessed after the procedure.                       After obtaining informed consent, the endoscope was                        passed under direct vision. Throughout the procedure,                        the patient's blood pressure, pulse, and oxygen                        saturations were monitored continuously. The was                        introduced through the mouth, and advanced to the                        second part of duodenum. The upper GI endoscopy was                        accomplished without difficulty. The patient tolerated  the procedure well. Findings:      The duodenal bulb and second portion of the duodenum were normal.       Biopsies for histology were taken with a cold forceps for evaluation of       celiac disease.      The entire examined stomach was normal. Biopsies were taken with a cold       forceps for Helicobacter pylori testing.      The cardia and gastric fundus were normal on retroflexion.      Esophagogastric landmarks were identified: the gastroesophageal junction       was found at 34 cm from the  incisors.      The gastroesophageal junction and examined esophagus were normal.       Biopsies were taken with a cold forceps for histology.      Bilious fluid was found in the gastric body. Impression:           - Normal duodenal bulb and second portion of the                        duodenum. Biopsied.                       - Normal stomach. Biopsied.                       - Esophagogastric landmarks identified.                       - Normal gastroesophageal junction and esophagus.                        Biopsied. Recommendation:       - Await pathology results.                       - Discharge patient to home (with escort).                       - Resume previous diet today.                       - Continue present medications.                       - Return to my office as previously scheduled. Procedure Code(s):    --- Professional ---                       [phone], Esophagogastroduodenoscopy, flexible, transoral;                        with biopsy, single or multiple Diagnosis Code(s):    --- Professional ---                       R10.13, Epigastric pain                       R12, Heartburn CPT copyright 2017 American Medical Association. All rights reserved. The codes documented in this report are preliminary and upon coder review may  be revised to meet current compliance requirements. Dr. Ulyess Mort Lin Landsman MD, MD 02/06/2018 10:08:08 AM This report has been signed electronically. Number of Addenda: 0 Note Initiated  On: 02/06/2018 9:43 AM Total Procedure Duration: 0 hours 6 minutes 28 seconds       [facility]

## 2018-02-06 NOTE — Anesthesia Postprocedure Evaluation (Signed)
Anesthesia Post Note  Patient: Elizabeth Bowers  Procedure(s) Performed: ESOPHAGOGASTRODUODENOSCOPY (EGD) WITH PROPOFOL with biopsies (N/A )  Patient location during evaluation: PACU Anesthesia Type: General Level of consciousness: awake and alert Pain management: pain level controlled Vital Signs Assessment: post-procedure vital signs reviewed and stable Respiratory status: spontaneous breathing, nonlabored ventilation, respiratory function stable and patient connected to nasal cannula oxygen Cardiovascular status: blood pressure returned to baseline and stable Postop Assessment: no apparent nausea or vomiting Anesthetic complications: no    Alisa Graff

## 2018-02-06 NOTE — Anesthesia Preprocedure Evaluation (Signed)
Anesthesia Evaluation  Patient identified by MRN, date of birth, ID band Patient awake    Reviewed: Allergy & Precautions, H&P , NPO status , Patient's Chart, lab work & pertinent test results, reviewed documented beta blocker date and time   Airway Mallampati: II  TM Distance: >3 FB Neck ROM: full    Dental no notable dental hx.    Pulmonary former smoker,    Pulmonary exam normal breath sounds clear to auscultation       Cardiovascular Exercise Tolerance: Good hypertension,  Rhythm:regular Rate:Normal     Neuro/Psych  Headaches, negative psych ROS   GI/Hepatic Neg liver ROS, GERD  ,  Endo/Other  negative endocrine ROS  Renal/GU negative Renal ROS  negative genitourinary   Musculoskeletal   Abdominal   Peds  Hematology negative hematology ROS (+)   Anesthesia Other Findings   Reproductive/Obstetrics negative OB ROS                             Anesthesia Physical Anesthesia Plan  ASA: II  Anesthesia Plan: General   Post-op Pain Management:    Induction:   PONV Risk Score and Plan:   Airway Management Planned:   Additional Equipment:   Intra-op Plan:   Post-operative Plan:   Informed Consent: I have reviewed the patients History and Physical, chart, labs and discussed the procedure including the risks, benefits and alternatives for the proposed anesthesia with the patient or authorized representative who has indicated his/her understanding and acceptance.   Dental Advisory Given  Plan Discussed with: CRNA  Anesthesia Plan Comments:         Anesthesia Quick Evaluation

## 2018-02-06 NOTE — H&P (Signed)
Cephas Darby, MD [address]  [place]  [place], [place] [address]  Main: [phone]  Fax: [phone] Pager: [phone]  Primary Care Physician:  Virginia Crews, MD Primary Gastroenterologist:  Dr. Cephas Darby  Pre-Procedure History & Physical: HPI:  Elizabeth Bowers is a 52 y.o. female is here for an endoscopy.   Past Medical History:  Diagnosis Date  . Fatty liver   . Fibromyalgia   . GERD (gastroesophageal reflux disease)   . Hemorrhoids   . Hot flashes   . Hypertension   . IBS (irritable bowel syndrome)   . Migraines   . Miscarriage    x 2; 4 live births   . Multilevel degenerative disc disease   . Vocal cord dysfunction     Past Surgical History:  Procedure Laterality Date  . ABDOMINAL HYSTERECTOMY     2012 removed cervix h/o abnormal pap   . CHOLECYSTECTOMY     2000  . COLONOSCOPY     2018 with + polpys and GIB 2/2 polyp removal   . ELBOW DEBRIDEMENT    . FOOT SURGERY      Prior to Admission medications   Medication Sig Start Date End Date Taking? Authorizing Provider  acyclovir ointment (ZOVIRAX) 5 % Apply 1 application topically every 3 (three) hours. Prn cold sore 07/06/17  Yes McLean-Scocuzza, Nino Glow, MD  budesonide (RHINOCORT ALLERGY) 32 MCG/ACT nasal spray Place into both nostrils daily.   Yes [provider]  cyclobenzaprine (FLEXERIL) 10 MG tablet Take 1 tablet (10 mg total) by mouth daily as needed for muscle spasms. 07/06/17  Yes McLean-Scocuzza, Nino Glow, MD  diazepam (VALIUM) 2 MG tablet Take 1 tablet (2 mg total) by mouth daily as needed. for anxiety 07/06/17  Yes McLean-Scocuzza, Nino Glow, MD  docusate sodium (COLACE) 100 MG capsule Take 200 mg by mouth at bedtime.    Yes [provider]  DULoxetine (CYMBALTA) 60 MG capsule TAKE 1 CAPSULE (60 MG TOTAL) BY MOUTH DAILY. 12/19/17  Yes Bacigalupo, [name], MD  Ketorolac Tromethamine (TORADOL ORAL PO) Take by mouth as needed.   Yes [provider]    lansoprazole (PREVACID) 30 MG capsule TAKE 1 CAPSULE BY MOUTH TWICE DAILY 01/31/18  Yes Karlena Luebke, Tally Due, MD  levocetirizine (XYZAL) 5 MG tablet  09/14/17  Yes [provider]  LINZESS 290 MCG CAPS capsule Take 1 capsule (290 mcg total) by mouth daily. 07/06/17  Yes McLean-Scocuzza, Nino Glow, MD  meclizine (ANTIVERT) 12.5 MG tablet Take 1 tablet (12.5 mg total) by mouth 3 (three) times daily as needed for dizziness. 07/06/17  Yes McLean-Scocuzza, Nino Glow, MD  montelukast (SINGULAIR) 10 MG tablet Take 1 tablet (10 mg total) by mouth at bedtime. 10/10/17  Yes Bacigalupo, [name], MD  Multiple Vitamin (MULTIVITAMIN WITH MINERALS) TABS tablet Take 1 tablet by mouth daily.   Yes [provider]  phentermine 37.5 MG capsule Take 1 capsule (37.5 mg total) by mouth every morning. 11/21/17  Yes Bacigalupo, [name], MD  potassium chloride SA (K-DUR,KLOR-CON) 20 MEQ tablet Take 1 tablet (20 mEq total) by mouth daily. 01/03/18 02/05/18 Yes Bacigalupo, [name], MD  promethazine (PHENERGAN) 25 MG tablet Take 1 tablet (25 mg total) by mouth every 8 (eight) hours as needed for nausea or vomiting. 07/06/17  Yes McLean-Scocuzza, Nino Glow, MD  triamterene-hydrochlorothiazide (MAXZIDE-25) 37.5-25 MG tablet Take 1 tablet by mouth daily. 07/06/17  Yes McLean-Scocuzza, Nino Glow, MD  valACYclovir (VALTREX) 500 MG tablet Take  1 tablet (500 mg total) by mouth 2 (two) times daily. X 1 week prn 07/06/17  Yes McLean-Scocuzza, Nino Glow, MD    Allergies as of 01/31/2018 - Review Complete 01/16/2018  Allergen Reaction Noted  . Ranitidine Diarrhea and Other (See Comments) 08/30/2016  . Amphetamine-dextroamphetamine Other (See Comments) 05/10/2016  . Reglan [metoclopramide] Other (See Comments) 12/15/2016  . Topiramate er Other (See Comments) 01/16/2018  . Sulfa antibiotics Anxiety 12/15/2016    Family History  Problem Relation Age of Onset  . Hypertension Mother   . Hyperlipidemia Mother   . Glaucoma Mother   .  Diabetes Mother        type 2   . COPD Father        emphysema  . Alcohol abuse Father   . Colon cancer Sister   . Cancer Sister        rectal/colon cancer no h/o IBD  . Colon polyps Sister   . Asthma Sister   . COPD Sister   . Kidney disease Sister   . Mental illness Sister        bipolar   . Miscarriages / Stillbirths Sister   . Uterine cancer Sister   . Alcohol abuse Son   . Depression Son   . Diabetes Son   . Drug abuse Son   . Mental illness Son        bipolar   . Diabetes Maternal Grandmother   . COPD Paternal Grandmother   . Cancer Paternal Grandfather        FH colon cancer maternal aunts/uncles   . Miscarriages / Stillbirths Sister   . Arthritis Son   . Arthritis Son     Social History   Socioeconomic History  . Marital status: Married    Spouse name: Kyung Rudd  . Number of children: 6  . Years of education: 31  . Highest education level: Associate degree: academic program  Occupational History  . Occupation: Microbiologist: [employer]  . Financial resource strain: Not hard at all  . Food insecurity:    Worry: Never true    Inability: Never true  . Transportation needs:    Medical: No    Non-medical: No  Tobacco Use  . Smoking status: Former Smoker    Packs/day: 0.75    Years: 5.00    Pack years: 3.75    Types: Cigarettes    Last attempt to quit: 07/06/1987    Years since quitting: 30.6  . Smokeless tobacco: Never Used  Substance and Sexual Activity  . Alcohol use: Yes    Alcohol/week: 2.0 - 4.0 standard drinks    Types: 2 - 4 Cans of beer per week    Comment: wine cooler  . Drug use: Never  . Sexual activity: Not on file  Lifestyle  . Physical activity:    Days per week: 0 days    Minutes per session: 0 min  . Stress: Not on file  Relationships  . Social connections:    Talks on phone: Not on file    Gets together: Not on file    Attends religious service: Not on file    Active member of club or  organization: Not on file    Attends meetings of clubs or organizations: Not on file    Relationship status: Not on file  . Intimate partner violence:    Fear of current or ex partner: Not on file    Emotionally abused: Not  on file    Physically abused: Not on file    Forced sexual activity: Not on file  Other Topics Concern  . Not on file  Social History Narrative  . Not on file    Review of Systems: See HPI, otherwise negative ROS  Physical Exam: BP 112/74   Pulse (!) 56   Temp 97.7 F (36.5 C) (Temporal)   Resp 16   Ht 5\' 2"  (1.575 m)   Wt 71.7 kg   SpO2 100%   BMI 28.90 kg/m  General:   Alert,  pleasant and cooperative in NAD Head:  Normocephalic and atraumatic. Neck:  Supple; no masses or thyromegaly. Lungs:  Clear throughout to auscultation.    Heart:  Regular rate and rhythm. Abdomen:  Soft, nontender and nondistended. Normal bowel sounds, without guarding, and without rebound.   Neurologic:  Alert and  oriented x4;  grossly normal neurologically.  Impression/Plan: [place] is here for an endoscopy to be performed for dyspepsia  Risks, benefits, limitations, and alternatives regarding  endoscopy have been reviewed with the patient.  Questions have been answered.  All parties agreeable.   Sherri Sear, MD  02/06/2018, 8:46 AM

## 2018-02-06 NOTE — Transfer of Care (Signed)
Immediate Anesthesia Transfer of Care Note  Patient: Elizabeth Bowers  Procedure(s) Performed: ESOPHAGOGASTRODUODENOSCOPY (EGD) WITH PROPOFOL with biopsies (N/A )  Patient Location: PACU  Anesthesia Type: General  Level of Consciousness: awake, alert  and patient cooperative  Airway and Oxygen Therapy: Patient Spontanous Breathing and Patient connected to supplemental oxygen  Post-op Assessment: Post-op Vital signs reviewed, Patient's Cardiovascular Status Stable, Respiratory Function Stable, Patent Airway and No signs of Nausea or vomiting  Post-op Vital Signs: Reviewed and stable  Complications: No apparent anesthesia complications

## 2018-02-06 NOTE — Anesthesia Procedure Notes (Signed)
Procedure Name: MAC Date/Time: 02/06/2018 9:49 AM Performed by: Janna Arch, CRNA Pre-anesthesia Checklist: Patient identified, Emergency Drugs available, Suction available and Patient being monitored Patient Re-evaluated:Patient Re-evaluated prior to induction Oxygen Delivery Method: Nasal cannula

## 2018-02-07 ENCOUNTER — Encounter: Payer: Self-pay | Admitting: Gastroenterology

## 2018-02-08 ENCOUNTER — Telehealth: Payer: Self-pay | Admitting: Family Medicine

## 2018-02-08 NOTE — Telephone Encounter (Signed)
Elizabeth Bowers have made several attempts to contact pt.I have also spoken to pt to give her their contact information.She has not made appointment

## 2018-02-15 DIAGNOSIS — G43719 Chronic migraine without aura, intractable, without status migrainosus: Secondary | ICD-10-CM | POA: Diagnosis not present

## 2018-02-19 NOTE — Progress Notes (Signed)
Patient: Elizabeth Bowers Female    DOB: 1965-10-24   52 y.o.   MRN: [id number] Visit Date: 02/20/2018  Today's Provider: Lavon Paganini, MD   Chief Complaint  Patient presents with  . Weight Check   Subjective:    I, Tiburcio Pea, CMA, am acting as a scribe for Lavon Paganini, MD.   HPI Patient is here today for 3 month follow-up for weight loss counseling. Last weight was 168 lb. Patient was advised to continue Phentermine, diet and exercise. She was taking Topamax but stopped due to increased migraines (she states this has happened in the past, but she didn't remember when this was prescribed). She reports not exercising regularly, but walks a lot at work. Her appetite is curbed well.  She feels that it was curbed too much when she initially started this with the topamax.  She has a bump on R index finger that has been present for a few weeks.  It is on DIP and somewhat TTP.  Nothing seems to make it worse or better.  No injury or trauma.   Wt Readings from Last 3 Encounters:  02/20/18 159 lb 6.4 oz (72.3 kg)  02/06/18 158 lb (71.7 kg)  01/16/18 154 lb (69.9 kg)     Allergies  Allergen Reactions  . Ranitidine Diarrhea and Other (See Comments)    Diarrhea and stomach cramping  . Amphetamine-Dextroamphetamine Other (See Comments)    Elevated BP  . Lactose Intolerance (Gi)     bloating  . Reglan [Metoclopramide] Other (See Comments)    Increased prolactin level   . Topiramate Er Other (See Comments)    Severe headache, nausea, vomiting, dizziness, visual disturbance    . Wheat Bran     bloating  . Sulfa Antibiotics Anxiety     Current Outpatient Medications:  .  acyclovir ointment (ZOVIRAX) 5 %, Apply 1 application topically every 3 (three) hours. Prn cold sore, Disp: 30 g, Rfl: 0 .  budesonide (RHINOCORT ALLERGY) 32 MCG/ACT nasal spray, Place into both nostrils daily., Disp: , Rfl:  .  cyclobenzaprine (FLEXERIL) 10 MG tablet, Take 1 tablet (10 mg total) by  mouth daily as needed for muscle spasms., Disp: 30 tablet, Rfl: 1 .  diazepam (VALIUM) 2 MG tablet, Take 1 tablet (2 mg total) by mouth daily as needed. for anxiety, Disp: 30 tablet, Rfl: 2 .  docusate sodium (COLACE) 100 MG capsule, Take 200 mg by mouth at bedtime. , Disp: , Rfl:  .  DULoxetine (CYMBALTA) 60 MG capsule, TAKE 1 CAPSULE (60 MG TOTAL) BY MOUTH DAILY., Disp: 30 capsule, Rfl: 1 .  Ketorolac Tromethamine (TORADOL ORAL PO), Take by mouth as needed., Disp: , Rfl:  .  lansoprazole (PREVACID) 30 MG capsule, TAKE 1 CAPSULE BY MOUTH TWICE DAILY, Disp: 60 capsule, Rfl: 2 .  levocetirizine (XYZAL) 5 MG tablet, , Disp: , Rfl: 12 .  LINZESS 290 MCG CAPS capsule, Take 1 capsule (290 mcg total) by mouth daily., Disp: 90 capsule, Rfl: 3 .  meclizine (ANTIVERT) 12.5 MG tablet, Take 1 tablet (12.5 mg total) by mouth 3 (three) times daily as needed for dizziness., Disp: 30 tablet, Rfl: 2 .  montelukast (SINGULAIR) 10 MG tablet, Take 1 tablet (10 mg total) by mouth at bedtime., Disp: 90 tablet, Rfl: 3 .  Multiple Vitamin (MULTIVITAMIN WITH MINERALS) TABS tablet, Take 1 tablet by mouth daily., Disp: , Rfl:  .  promethazine (PHENERGAN) 25 MG tablet, Take 1 tablet (25 mg  total) by mouth every 8 (eight) hours as needed for nausea or vomiting., Disp: 30 tablet, Rfl: 2 .  triamterene-hydrochlorothiazide (MAXZIDE-25) 37.5-25 MG tablet, Take 1 tablet by mouth daily., Disp: 90 tablet, Rfl: 0 .  valACYclovir (VALTREX) 500 MG tablet, Take 1 tablet (500 mg total) by mouth 2 (two) times daily. X 1 week prn, Disp: 60 tablet, Rfl: 1 .  potassium chloride SA (K-DUR,KLOR-CON) 20 MEQ tablet, Take 1 tablet (20 mEq total) by mouth daily., Disp: 30 tablet, Rfl: 5  Review of Systems  Constitutional: Negative.   Respiratory: Negative.   Cardiovascular: Negative.   Gastrointestinal: Negative.   Genitourinary: Negative.   Musculoskeletal: Negative.   Neurological: Negative.   Psychiatric/Behavioral: Negative.      Social History   Tobacco Use  . Smoking status: Former Smoker    Packs/day: 0.75    Years: 5.00    Pack years: 3.75    Types: Cigarettes    Last attempt to quit: 07/06/1987    Years since quitting: 30.6  . Smokeless tobacco: Never Used  Substance Use Topics  . Alcohol use: Yes    Alcohol/week: 2.0 - 4.0 standard drinks    Types: 2 - 4 Cans of beer per week    Comment: wine cooler   Objective:   BP 124/82 (BP Location: Right Arm, Patient Position: Sitting, Cuff Size: Normal)   Pulse 70   Temp 98.4 F (36.9 C) (Oral)   Wt 159 lb 6.4 oz (72.3 kg)   SpO2 97%   BMI 29.15 kg/m  Vitals:   02/20/18 0808  BP: 124/82  Pulse: 70  Temp: 98.4 F (36.9 C)  TempSrc: Oral  SpO2: 97%  Weight: 159 lb 6.4 oz (72.3 kg)     Physical Exam  Constitutional: She is oriented to person, place, and time. She appears well-developed and well-nourished. No distress.  HENT:  Head: Normocephalic and atraumatic.  Eyes: Conjunctivae are normal. No scleral icterus.  Neck: Neck supple. No thyromegaly present.  Cardiovascular: Normal rate, regular rhythm, normal heart sounds and intact distal pulses.  No murmur heard. Pulmonary/Chest: Effort normal and breath sounds normal. No respiratory distress. She has no wheezes. She has no rales.  Musculoskeletal: She exhibits no edema.  Ganglion cyst on R index finger DIP  Lymphadenopathy:    She has no cervical adenopathy.  Neurological: She is alert and oriented to person, place, and time.  Skin: Skin is warm and dry. Capillary refill takes less than 2 seconds. No rash noted.  Psychiatric: She has a normal mood and affect. Her behavior is normal.  Vitals reviewed.       Assessment & Plan:   Problem List Items Addressed This Visit      Cardiovascular and Mediastinum   Migraine    She is seeing neurology for blocks now Doubt that topamax was increasing migraine frequency, but we will avoid regardless        Other   Overweight - Primary     Patient did not tolerate Topamax well She is finishing her 3 month course of phentermine, so we will d/c Discussed importance of diet and exercise - she insists that she is active at work and eats healthy Congratulated her on weight loss and BMI is now in overweight range, not obese Discussed that often we see rebound weight gain after d/c'ing phentermine      Ganglion cyst    New problem Discussed natural course and that they are often self-limited Discussed that she could see  orthopedics for corticosteroid injection if it does not self resolve          Return in about 6 months (around 08/21/2018) for hypertension f/u.   The entirety of the information documented in the History of Present Illness, Review of Systems and Physical Exam were personally obtained by me. Portions of this information were initially documented by Tiburcio Pea, CMA and reviewed by me for thoroughness and accuracy.    Virginia Crews, MD, MPH [facility] 02/20/2018 10:44 AM

## 2018-02-20 ENCOUNTER — Ambulatory Visit (INDEPENDENT_AMBULATORY_CARE_PROVIDER_SITE_OTHER): Payer: 59 | Admitting: Family Medicine

## 2018-02-20 ENCOUNTER — Encounter: Payer: Self-pay | Admitting: Family Medicine

## 2018-02-20 VITALS — BP 124/82 | HR 70 | Temp 98.4°F | Wt 159.4 lb

## 2018-02-20 DIAGNOSIS — G43709 Chronic migraine without aura, not intractable, without status migrainosus: Secondary | ICD-10-CM | POA: Diagnosis not present

## 2018-02-20 DIAGNOSIS — E663 Overweight: Secondary | ICD-10-CM

## 2018-02-20 DIAGNOSIS — M6740 Ganglion, unspecified site: Secondary | ICD-10-CM | POA: Diagnosis not present

## 2018-02-20 NOTE — Assessment & Plan Note (Signed)
Patient did not tolerate Topamax well She is finishing her 3 month course of phentermine, so we will d/c Discussed importance of diet and exercise - she insists that she is active at work and eats healthy Congratulated her on weight loss and BMI is now in overweight range, not obese Discussed that often we see rebound weight gain after d/c'ing phentermine

## 2018-02-20 NOTE — Assessment & Plan Note (Signed)
She is seeing neurology for blocks now Doubt that topamax was increasing migraine frequency, but we will avoid regardless

## 2018-02-20 NOTE — Assessment & Plan Note (Signed)
New problem Discussed natural course and that they are often self-limited Discussed that she could see orthopedics for corticosteroid injection if it does not self resolve

## 2018-02-20 NOTE — Patient Instructions (Addendum)
Finish up 3 month course of phentermine and then stop  Try to get record of tetanus shot from [facility]  Call and schedule mammogram   Ganglion Cyst A ganglion cyst is a noncancerous, fluid-filled lump that occurs near joints or tendons. The ganglion cyst grows out of a joint or the lining of a tendon. It most often develops in the hand or wrist, but it can also develop in the shoulder, elbow, hip, knee, ankle, or foot. The round or oval ganglion cyst can be the size of a pea or larger than a grape. Increased activity may enlarge the size of the cyst because more fluid starts to build up. What are the causes? It is not known what causes a ganglion cyst to grow. However, it may be related to:  Inflammation or irritation around the joint.  An injury.  Repetitive movements or overuse.  Arthritis.  What increases the risk? Risk factors include:  Being a woman.  Being age 3-50.  What are the signs or symptoms? Symptoms may include:  A lump. This most often appears on the hand or wrist, but it can occur in other areas of the body.  Tingling.  Pain.  Numbness.  Muscle weakness.  Weak grip.  Less movement in a joint.  How is this diagnosed? Ganglion cysts are most often diagnosed based on a physical exam. Your health care provider will feel the lump and may shine a light alongside it. If it is a ganglion cyst, a light often shines through it. Your health care provider may order an X-ray, ultrasound, or MRI to rule out other conditions. How is this treated? Ganglion cysts usually go away on their own without treatment. If pain or other symptoms are involved, treatment may be needed. Treatment is also needed if the ganglion cyst limits your movement or if it gets infected. Treatment may include:  Wearing a brace or splint on your wrist or finger.  Taking anti-inflammatory medicine.  Draining fluid from the lump with a needle (aspiration).  Injecting a steroid into the  joint.  Surgery to remove the ganglion cyst.  Follow these instructions at home:  Do not press on the ganglion cyst, poke it with a needle, or hit it.  Take medicines only as directed by your health care provider.  Wear your brace or splint as directed by your health care provider.  Watch your ganglion cyst for any changes.  Keep all follow-up visits as directed by your health care provider. This is important. Contact a health care provider if:  Your ganglion cyst becomes larger or more painful.  You have increased redness, red streaks, or swelling.  You have pus coming from the lump.  You have weakness or numbness in the affected area.  You have a fever or chills. This information is not intended to replace advice given to you by your health care provider. Make sure you discuss any questions you have with your health care provider. Document Released: 05/19/2000 Document Revised: 10/28/2015 Document Reviewed: 11/04/2013 Elsevier Interactive Patient Education  2018 [employer].

## 2018-02-21 ENCOUNTER — Ambulatory Visit: Payer: Self-pay | Admitting: Family Medicine

## 2018-02-23 DIAGNOSIS — G43719 Chronic migraine without aura, intractable, without status migrainosus: Secondary | ICD-10-CM | POA: Diagnosis not present

## 2018-02-27 ENCOUNTER — Other Ambulatory Visit: Payer: Self-pay | Admitting: Family Medicine

## 2018-03-04 DIAGNOSIS — G43719 Chronic migraine without aura, intractable, without status migrainosus: Secondary | ICD-10-CM | POA: Diagnosis not present

## 2018-03-15 DIAGNOSIS — I10 Essential (primary) hypertension: Secondary | ICD-10-CM | POA: Diagnosis not present

## 2018-03-15 DIAGNOSIS — N183 Chronic kidney disease, stage 3 (moderate): Secondary | ICD-10-CM | POA: Diagnosis not present

## 2018-03-18 NOTE — Progress Notes (Signed)
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 detected.  Sensory (Neurological): Unimpaired          Sensory (Neurological): Unimpaired          Palpation: No palpable anomalies              Palpation: No palpable anomalies              Provocative Test(s):  Phalen's test: deferred Tinel's test: deferred Apley's scratch test (touch opposite shoulder):  Action 1 (Across chest): deferred Action 2 (Overhead): deferred Action 3 (LB reach): deferred   Provocative Test(s):  Phalen's test: deferred Tinel's test: deferred Apley's scratch test (touch opposite shoulder):  Action 1 (Across chest): deferred Action 2 (Overhead): deferred Action 3 (LB reach): deferred    Thoracic Spine Area Exam  Skin & Axial Inspection: No masses, redness, or swelling Alignment: Symmetrical Functional ROM: Unrestricted ROM Stability: No instability detected Muscle Tone/Strength:  Functionally intact. No obvious neuro-muscular anomalies detected. Sensory (Neurological): Unimpaired Muscle strength & Tone: No palpable anomalies  Lumbar Spine Area Exam  Skin & Axial Inspection: No masses, redness, or swelling Alignment: Symmetrical Functional ROM: Decreased ROM       Stability: No instability detected Muscle Tone/Strength: Increased muscle tone over affected area Sensory (Neurological): Unimpaired Palpation: Complains of area being tender to palpation       Provocative Tests: Hyperextension/rotation test: (+) bilaterally for facet joint pain. Lumbar quadrant test (Kemp's test): (+) bilaterally for facet joint pain. Lateral bending test: deferred today       Patrick's Maneuver: (+) for right-sided S-I arthralgia and for bilateral hip arthralgia FABER test: (+) for right-sided S-I arthralgia and for bilateral hip arthralgia S-I anterior distraction/compression test: deferred today         S-I lateral compression test: deferred today         S-I Thigh-thrust test: deferred today         S-I Gaenslen's test: deferred today          Gait & Posture Assessment  Ambulation: Unassisted Gait: Relatively normal for age and body habitus Posture: WNL   Lower Extremity Exam    Side: Right lower extremity  Side: Left lower extremity  Stability: No instability observed          Stability: No instability observed          Skin & Extremity Inspection: Skin color, temperature, and hair growth  are WNL. No peripheral edema or cyanosis. No masses, redness, swelling, asymmetry, or associated skin lesions. No contractures.  Skin & Extremity Inspection: Skin color, temperature, and hair growth are WNL. No peripheral edema or cyanosis. No masses, redness, swelling, asymmetry, or associated skin lesions. No contractures.  Functional ROM: Decreased ROM for hip and knee joints          Functional ROM: Decreased ROM for hip and knee joints          Muscle Tone/Strength: Able to Toe-walk &  Heel-walk without problems  Muscle Tone/Strength: Able to Toe-walk & Heel-walk without problems  Sensory (Neurological): Articular pain pattern  Sensory (Neurological): Articular pain pattern  Palpation: No palpable anomalies  Palpation: No palpable anomalies   Assessment & Plan  Primary Diagnosis & Pertinent Problem List: The primary encounter diagnosis was Chronic pain syndrome. Diagnoses of Chronic low back pain (Primary Area of Pain) (Bilateral) (L>R) w/ sciatica (Bilateral), DDD (degenerative disc disease), lumbar, Spondylosis without myelopathy or radiculopathy, lumbar region, Lumbar facet syndrome (Bilateral) (R>L), Chronic sacroiliac joint pain (Right), Chronic somatic sacroiliac joint dysfunction (Right), Sacroiliac joint dysfunction (Right), Chronic lower extremity pain (Secondary Area of Pain) (Bilateral) (L>R), Chronic hip pain (Bilateral) (L>R), Osteoarthritis of hip (Bilateral), Chronic knee pain (Right), Chronic foot pain (Left), Chronic neck pain (Tertiary Area of Pain) (midline), Cervical facet syndrome, Cervicalgia (Bilateral), Cervicogenic headache (Bilateral), Chronic occipital neuralgia (Bilateral), DDD (degenerative disc disease), cervical, Spondylosis without myelopathy or radiculopathy, cervical region, Cervical Grade 1 (2 mm) Retrolisthesis C5 over C6, Chronic upper extremity pain (Fourth Area of Pain) (Bilateral) (R>L), Cervical foraminal stenosis (C3-4 & C5-6) (Bilateral), Fibromyalgia, Disorder of skeletal system, Pharmacologic therapy, Problems influencing health status, Long term prescription benzodiazepine use, CKD (chronic kidney disease) stage 3, GFR 30-59 ml/min (HCC), and Gastroesophageal reflux disease without esophagitis were also pertinent to this visit.  Visit Diagnosis: 1. Chronic pain syndrome   2. Chronic low back pain (Primary Area of Pain) (Bilateral) (L>R) w/ sciatica (Bilateral)   3. DDD (degenerative disc disease), lumbar   4. Spondylosis without myelopathy  or radiculopathy, lumbar region   5. Lumbar facet syndrome (Bilateral) (R>L)   6. Chronic sacroiliac joint pain (Right)   7. Chronic somatic sacroiliac joint dysfunction (Right)   8. Sacroiliac joint dysfunction (Right)   9. Chronic lower extremity pain (Secondary Area of Pain) (Bilateral) (L>R)   10. Chronic hip pain (Bilateral) (L>R)   11. Osteoarthritis of hip (Bilateral)   12. Chronic knee pain (Right)   13. Chronic foot pain (Left)   14. Chronic neck pain (Tertiary Area of Pain) (midline)   15. Cervical facet syndrome   16. Cervicalgia (Bilateral)   17. Cervicogenic headache (Bilateral)   18. Chronic occipital neuralgia (Bilateral)   19. DDD (degenerative disc disease), cervical   20. Spondylosis without myelopathy or radiculopathy, cervical region   21. Cervical Grade 1 (2 mm) Retrolisthesis C5 over C6   22. Chronic upper extremity pain (Fourth Area of Pain) (Bilateral) (R>L)   23. Cervical foraminal stenosis (C3-4 & C5-6) (Bilateral)   24. Fibromyalgia   25. Disorder of skeletal system   26. Pharmacologic therapy   27. Problems influencing health status   28. Long term prescription benzodiazepine use   29. CKD (chronic kidney disease) stage 3, GFR 30-59 ml/min (HCC)   30. Gastroesophageal reflux disease without esophagitis    Problems updated and reviewed during this visit: Problem  Cervical Grade 1 (2 mm) Retrolisthesis C5 over C6  Cervical foraminal stenosis (C3-4 & C5-6) (  Bilateral)   Suspected foraminal narrowing on the left at C3-C4 and C5-C6, and on the right at C3-C4 and C5-C6.    Ddd (Degenerative Disc Disease), Cervical  Spondylosis Without Myelopathy Or Radiculopathy, Cervical Region  Chronic hip pain (Bilateral) (L>R)  Lumbar facet syndrome (Bilateral) (R>L)  Spondylosis Without Myelopathy Or Radiculopathy, Lumbar Region  Chronic sacroiliac joint dysfunction (Right)  Chronic somatic sacroiliac joint dysfunction (Right)  Osteoarthritis of hip (Bilateral)   Cervicogenic headache (Bilateral)  Cervicalgia (Bilateral)  Cervical facet syndrome (Bilateral)  Chronic occipital neuralgia (Bilateral)  Chronic sacroiliac joint pain (Right)  Long Term Prescription Benzodiazepine Use  Ckd (Chronic Kidney Disease) Stage 3, Gfr 30-59 Ml/Min (Hcc)  Acid Reflux Disease    Plan of Care  Pharmacotherapy (Medications Ordered): Meds ordered this encounter  Medications  . Magnesium Oxide 500 MG CAPS    Sig: Take 1 capsule (500 mg total) by mouth at bedtime as needed and may repeat dose one time if needed.    Dispense:  90 capsule    Refill:  0    Do not place medication on "Automatic Refill". Fill one day early if pharmacy is closed on scheduled refill date.    Procedure Orders     CERVICAL FACET (MEDIAL BRANCH NERVE BLOCK)  Lab Orders  No laboratory test(s) ordered today    Imaging Orders     DG HIP UNILAT W OR W/O PELVIS 2-3 VIEWS RIGHT     DG HIP UNILAT W OR W/O PELVIS 2-3 VIEWS LEFT  Referral Orders     Ambulatory referral to Podiatry  Pharmacological management options:  Opioid Analgesics: We'll take over management today. See above orders Membrane stabilizer: We have discussed the possibility of optimizing this mode of therapy, if tolerated Muscle relaxant: We have discussed the possibility of a trial NSAID: We have discussed the possibility of a trial Other analgesic(s): To be determined at a later time   Interventional management options: Planned, scheduled, and/or pending:    Diagnostic bilateral cervical facet block #1 under fluoroscopic guidance and IV sedation   Considering:   Diagnostic bilateral cervical facet block  Possible bilateral cervical facet RFA  Diagnostic bilateral lumbar facet block  Possible bilateral lumbar facet RFA  Diagnostic right-sided sacroiliac joint block  Possible right-sided sacroiliac joint RFA  Diagnostic bilateral intra-articular hip joint injection with local anesthetic and steroid  Diagnostic  bilateral femoral nerve block + obturator nerve block  Possible bilateral femoral nerve + obturator nerve RFA  Diagnostic bilateral L5 transforaminal ESI  Diagnostic bilateral intra-articular knee joint injection with local anesthetic and steroid  Therapeutic series of 5 intra-articular bilateral Hyalgan knee injections  Diagnostic bilateral genicular nerve block  Possible bilateral genicular nerve RFA    PRN Procedures:   None at this time   Provider-requested follow-up: Return for Procedure (w/ sedation): (B) C-FCT BLK #1.  Future Appointments  Date Time Provider [place]  03/25/2018 10:00 AM [facility]  08/21/2018  8:40 AM Bacigalupo, [name], MD [id number] None    Primary Care Physician: Virginia Crews, MD Location: [facility] Note by: Gaspar Cola, MD Date: 03/20/2018; Time: 1:30 PM

## 2018-03-19 DIAGNOSIS — G8929 Other chronic pain: Secondary | ICD-10-CM | POA: Insufficient documentation

## 2018-03-19 DIAGNOSIS — M79601 Pain in right arm: Secondary | ICD-10-CM

## 2018-03-19 DIAGNOSIS — M79602 Pain in left arm: Secondary | ICD-10-CM

## 2018-03-20 ENCOUNTER — Other Ambulatory Visit: Payer: Self-pay

## 2018-03-20 ENCOUNTER — Ambulatory Visit: Payer: 59 | Attending: Pain Medicine | Admitting: Pain Medicine

## 2018-03-20 ENCOUNTER — Encounter: Payer: Self-pay | Admitting: Pain Medicine

## 2018-03-20 ENCOUNTER — Other Ambulatory Visit: Payer: Self-pay | Admitting: Nephrology

## 2018-03-20 VITALS — BP 124/85 | HR 65 | Temp 98.6°F | Ht 62.0 in | Wt 160.0 lb

## 2018-03-20 DIAGNOSIS — G8929 Other chronic pain: Secondary | ICD-10-CM

## 2018-03-20 DIAGNOSIS — M533 Sacrococcygeal disorders, not elsewhere classified: Secondary | ICD-10-CM

## 2018-03-20 DIAGNOSIS — M5136 Other intervertebral disc degeneration, lumbar region: Secondary | ICD-10-CM

## 2018-03-20 DIAGNOSIS — M79602 Pain in left arm: Secondary | ICD-10-CM | POA: Insufficient documentation

## 2018-03-20 DIAGNOSIS — N183 Chronic kidney disease, stage 3 unspecified: Secondary | ICD-10-CM

## 2018-03-20 DIAGNOSIS — G894 Chronic pain syndrome: Secondary | ICD-10-CM | POA: Diagnosis not present

## 2018-03-20 DIAGNOSIS — M160 Bilateral primary osteoarthritis of hip: Secondary | ICD-10-CM

## 2018-03-20 DIAGNOSIS — M25552 Pain in left hip: Secondary | ICD-10-CM

## 2018-03-20 DIAGNOSIS — J329 Chronic sinusitis, unspecified: Secondary | ICD-10-CM | POA: Insufficient documentation

## 2018-03-20 DIAGNOSIS — Z6829 Body mass index (BMI) 29.0-29.9, adult: Secondary | ICD-10-CM | POA: Insufficient documentation

## 2018-03-20 DIAGNOSIS — M5481 Occipital neuralgia: Secondary | ICD-10-CM | POA: Diagnosis not present

## 2018-03-20 DIAGNOSIS — M25561 Pain in right knee: Secondary | ICD-10-CM | POA: Insufficient documentation

## 2018-03-20 DIAGNOSIS — Z79899 Other long term (current) drug therapy: Secondary | ICD-10-CM | POA: Insufficient documentation

## 2018-03-20 DIAGNOSIS — Z8249 Family history of ischemic heart disease and other diseases of the circulatory system: Secondary | ICD-10-CM | POA: Insufficient documentation

## 2018-03-20 DIAGNOSIS — M9904 Segmental and somatic dysfunction of sacral region: Secondary | ICD-10-CM

## 2018-03-20 DIAGNOSIS — M4802 Spinal stenosis, cervical region: Secondary | ICD-10-CM

## 2018-03-20 DIAGNOSIS — Z87891 Personal history of nicotine dependence: Secondary | ICD-10-CM | POA: Insufficient documentation

## 2018-03-20 DIAGNOSIS — Z5181 Encounter for therapeutic drug level monitoring: Secondary | ICD-10-CM | POA: Diagnosis not present

## 2018-03-20 DIAGNOSIS — K625 Hemorrhage of anus and rectum: Secondary | ICD-10-CM | POA: Insufficient documentation

## 2018-03-20 DIAGNOSIS — M542 Cervicalgia: Secondary | ICD-10-CM | POA: Insufficient documentation

## 2018-03-20 DIAGNOSIS — Z881 Allergy status to other antibiotic agents status: Secondary | ICD-10-CM | POA: Insufficient documentation

## 2018-03-20 DIAGNOSIS — M4312 Spondylolisthesis, cervical region: Secondary | ICD-10-CM | POA: Insufficient documentation

## 2018-03-20 DIAGNOSIS — M5030 Other cervical disc degeneration, unspecified cervical region: Secondary | ICD-10-CM | POA: Insufficient documentation

## 2018-03-20 DIAGNOSIS — M5442 Lumbago with sciatica, left side: Secondary | ICD-10-CM | POA: Diagnosis not present

## 2018-03-20 DIAGNOSIS — M47896 Other spondylosis, lumbar region: Secondary | ICD-10-CM | POA: Insufficient documentation

## 2018-03-20 DIAGNOSIS — M47812 Spondylosis without myelopathy or radiculopathy, cervical region: Secondary | ICD-10-CM

## 2018-03-20 DIAGNOSIS — K219 Gastro-esophageal reflux disease without esophagitis: Secondary | ICD-10-CM | POA: Diagnosis not present

## 2018-03-20 DIAGNOSIS — Z888 Allergy status to other drugs, medicaments and biological substances status: Secondary | ICD-10-CM | POA: Insufficient documentation

## 2018-03-20 DIAGNOSIS — K635 Polyp of colon: Secondary | ICD-10-CM | POA: Insufficient documentation

## 2018-03-20 DIAGNOSIS — M79604 Pain in right leg: Secondary | ICD-10-CM | POA: Diagnosis not present

## 2018-03-20 DIAGNOSIS — Z9049 Acquired absence of other specified parts of digestive tract: Secondary | ICD-10-CM | POA: Insufficient documentation

## 2018-03-20 DIAGNOSIS — M25551 Pain in right hip: Secondary | ICD-10-CM

## 2018-03-20 DIAGNOSIS — M899 Disorder of bone, unspecified: Secondary | ICD-10-CM

## 2018-03-20 DIAGNOSIS — M47892 Other spondylosis, cervical region: Secondary | ICD-10-CM | POA: Diagnosis not present

## 2018-03-20 DIAGNOSIS — M79601 Pain in right arm: Secondary | ICD-10-CM | POA: Diagnosis not present

## 2018-03-20 DIAGNOSIS — M5441 Lumbago with sciatica, right side: Secondary | ICD-10-CM | POA: Diagnosis not present

## 2018-03-20 DIAGNOSIS — M797 Fibromyalgia: Secondary | ICD-10-CM

## 2018-03-20 DIAGNOSIS — K589 Irritable bowel syndrome without diarrhea: Secondary | ICD-10-CM | POA: Insufficient documentation

## 2018-03-20 DIAGNOSIS — E663 Overweight: Secondary | ICD-10-CM | POA: Insufficient documentation

## 2018-03-20 DIAGNOSIS — B9689 Other specified bacterial agents as the cause of diseases classified elsewhere: Secondary | ICD-10-CM | POA: Insufficient documentation

## 2018-03-20 DIAGNOSIS — R51 Headache: Secondary | ICD-10-CM

## 2018-03-20 DIAGNOSIS — I129 Hypertensive chronic kidney disease with stage 1 through stage 4 chronic kidney disease, or unspecified chronic kidney disease: Secondary | ICD-10-CM | POA: Diagnosis not present

## 2018-03-20 DIAGNOSIS — F1390 Sedative, hypnotic, or anxiolytic use, unspecified, uncomplicated: Secondary | ICD-10-CM | POA: Diagnosis not present

## 2018-03-20 DIAGNOSIS — M79672 Pain in left foot: Secondary | ICD-10-CM | POA: Insufficient documentation

## 2018-03-20 DIAGNOSIS — M47816 Spondylosis without myelopathy or radiculopathy, lumbar region: Secondary | ICD-10-CM

## 2018-03-20 DIAGNOSIS — K760 Fatty (change of) liver, not elsewhere classified: Secondary | ICD-10-CM | POA: Insufficient documentation

## 2018-03-20 DIAGNOSIS — M79605 Pain in left leg: Secondary | ICD-10-CM | POA: Diagnosis not present

## 2018-03-20 DIAGNOSIS — I808 Phlebitis and thrombophlebitis of other sites: Secondary | ICD-10-CM | POA: Insufficient documentation

## 2018-03-20 DIAGNOSIS — J309 Allergic rhinitis, unspecified: Secondary | ICD-10-CM | POA: Insufficient documentation

## 2018-03-20 DIAGNOSIS — Z789 Other specified health status: Secondary | ICD-10-CM

## 2018-03-20 DIAGNOSIS — M4310 Spondylolisthesis, site unspecified: Secondary | ICD-10-CM

## 2018-03-20 DIAGNOSIS — E785 Hyperlipidemia, unspecified: Secondary | ICD-10-CM | POA: Insufficient documentation

## 2018-03-20 DIAGNOSIS — G4486 Cervicogenic headache: Secondary | ICD-10-CM

## 2018-03-20 DIAGNOSIS — M51369 Other intervertebral disc degeneration, lumbar region without mention of lumbar back pain or lower extremity pain: Secondary | ICD-10-CM

## 2018-03-20 MED ORDER — MAGNESIUM OXIDE -MG SUPPLEMENT 500 MG PO CAPS: 1.0000 | ORAL_CAPSULE | Freq: Every evening | ORAL | 0 refills | Status: DC | PRN

## 2018-03-20 NOTE — Patient Instructions (Addendum)
____________________________________________________________________________________________  Pain Scale  Introduction: The pain score used by this practice is the Verbal Numerical Rating Scale (VNRS-11). This is an 11-point scale. It is for adults and children 10 years or older. There are significant differences in how the pain score is reported, used, and applied. Forget everything you learned in the past and learn this scoring system.  General Information: The scale should reflect your current level of pain. Unless you are specifically asked for the level of your worst pain, or your average pain. If you are asked for one of these two, then it should be understood that it is over the past 24 hours.  Basic Activities of Daily Living (ADL): Personal hygiene, dressing, eating, transferring, and using restroom.  Instructions: Most patients tend to report their level of pain as a combination of two factors, their physical pain and their psychosocial pain. This last one is also known as "suffering" and it is reflection of how physical pain affects you socially and psychologically. From now on, report them separately. From this point on, when asked to report your pain level, report only your physical pain. Use the following table for reference.  Pain Clinic Pain Levels (0-5/10)  Pain Level Score  Description  No Pain 0   Mild pain 1 Nagging, annoying, but does not interfere with basic activities of daily living (ADL). Patients are able to eat, bathe, get dressed, toileting (being able to get on and off the toilet and perform personal hygiene functions), transfer (move in and out of bed or a chair without assistance), and maintain continence (able to control bladder and bowel functions). Blood pressure and heart rate are unaffected. A normal heart rate for a healthy adult ranges from 60 to 100 bpm (beats per minute).   Mild to moderate pain 2 Noticeable and distracting. Impossible to hide from other  people. More frequent flare-ups. Still possible to adapt and function close to normal. It can be very annoying and may have occasional stronger flare-ups. With discipline, patients may get used to it and adapt.   Moderate pain 3 Interferes significantly with activities of daily living (ADL). It becomes difficult to feed, bathe, get dressed, get on and off the toilet or to perform personal hygiene functions. Difficult to get in and out of bed or a chair without assistance. Very distracting. With effort, it can be ignored when deeply involved in activities.   Moderately severe pain 4 Impossible to ignore for more than a few minutes. With effort, patients may still be able to manage work or participate in some social activities. Very difficult to concentrate. Signs of autonomic nervous system discharge are evident: dilated pupils (mydriasis); mild sweating (diaphoresis); sleep interference. Heart rate becomes elevated (>115 bpm). Diastolic blood pressure (lower number) rises above 100 mmHg. Patients find relief in laying down and not moving.   Severe pain 5 Intense and extremely unpleasant. Associated with frowning face and frequent crying. Pain overwhelms the senses.  Ability to do any activity or maintain social relationships becomes significantly limited. Conversation becomes difficult. Pacing back and forth is common, as getting into a comfortable position is nearly impossible. Pain wakes you up from deep sleep. Physical signs will be obvious: pupillary dilation; increased sweating; goosebumps; brisk reflexes; cold, clammy hands and feet; nausea, vomiting or dry heaves; loss of appetite; significant sleep disturbance with inability to fall asleep or to remain asleep. When persistent, significant weight loss is observed due to the complete loss of appetite and sleep deprivation.  Blood   pressure and heart rate becomes significantly elevated. Caution: If elevated blood pressure triggers a pounding headache  associated with blurred vision, then the patient should immediately seek attention at an urgent or emergency care unit, as these may be signs of an impending stroke.    Emergency Department Pain Levels (6-10/10)  Emergency Room Pain 6 Severely limiting. Requires emergency care and should not be seen or managed at an outpatient pain management facility. Communication becomes difficult and requires great effort. Assistance to reach the emergency department may be required. Facial flushing and profuse sweating along with potentially dangerous increases in heart rate and blood pressure will be evident.   Distressing pain 7 Self-care is very difficult. Assistance is required to transport, or use restroom. Assistance to reach the emergency department will be required. Tasks requiring coordination, such as bathing and getting dressed become very difficult.   Disabling pain 8 Self-care is no longer possible. At this level, pain is disabling. The individual is unable to do even the most "basic" activities such as walking, eating, bathing, dressing, transferring to a bed, or toileting. Fine motor skills are lost. It is difficult to think clearly.   Incapacitating pain 9 Pain becomes incapacitating. Thought processing is no longer possible. Difficult to remember your own name. Control of movement and coordination are lost.   The worst pain imaginable 10 At this level, most patients pass out from pain. When this level is reached, collapse of the autonomic nervous system occurs, leading to a sudden drop in blood pressure and heart rate. This in turn results in a temporary and dramatic drop in blood flow to the brain, leading to a loss of consciousness. Fainting is one of the body's self defense mechanisms. Passing out puts the brain in a calmed state and causes it to shut down for a while, in order to begin the healing process.    Summary: 1. Refer to this scale when providing [place] with your pain level. 2. Be  accurate and careful when reporting your pain level. This will help with your care. 3. Over-reporting your pain level will lead to loss of credibility. 4. Even a level of 1/10 means that there is pain and will be treated at our facility. 5. High, inaccurate reporting will be documented as "Symptom Exaggeration", leading to loss of credibility and suspicions of possible secondary gains such as obtaining more narcotics, or wanting to appear disabled, for fraudulent reasons. 6. Only pain levels of 5 or below will be seen at our facility. 7. Pain levels of 6 and above will be sent to the Emergency Department and the appointment cancelled. ____________________________________________________________________________________________   ____________________________________________________________________________________________  Muscle Spasms & Cramps  Cause:  The most common cause of muscle spasms and cramps is vitamin and/or electrolyte (calcium, potassium, sodium, etc.) deficiencies.  Possible triggers: Sweating - causes loss of electrolytes thru the skin. Steroids - causes loss of electrolytes thru the urine.  Treatment: 1. Gatorade (or any other electrolyte-replenishing drink) - Take 1, 8 oz glass with each meal (3 times a day). 2. OTC (over-the-counter) Magnesium 400 to 500 mg - Take 1 tablet twice a day (one with breakfast and one before bedtime). If you have kidney problems, talk to your primary care physician before taking any Magnesium. 3. Tonic Water with quinine - Take 1, 8 oz glass before bedtime.   ____________________________________________________________________________________________   ____________________________________________________________________________________________  Preparing for Procedure with Sedation  Instructions: . Oral Intake: Do not eat or drink anything for at least 8 hours prior  to your procedure. . Transportation: Public transportation is not  allowed. Bring an adult driver. The driver must be physically present in our waiting room before any procedure can be started. Marland Kitchen Physical Assistance: Bring an adult physically capable of assisting you, in the event you need help. This adult should keep you company at home for at least 6 hours after the procedure. . Blood Pressure Medicine: Take your blood pressure medicine with a sip of water the morning of the procedure. . Blood thinners: Notify our staff if you are taking any blood thinners. Depending on which one you take, there will be specific instructions on how and when to stop it. . Diabetics on insulin: Notify the staff so that you can be scheduled 1st case in the morning. If your diabetes requires high dose insulin, take only  of your normal insulin dose the morning of the procedure and notify the staff that you have done so. . Preventing infections: Shower with an antibacterial soap the morning of your procedure. . Build-up your immune system: Take 1000 mg of Vitamin C with every meal (3 times a day) the day prior to your procedure. Marland Kitchen Antibiotics: Inform the staff if you have a condition or reason that requires you to take antibiotics before dental procedures. . Pregnancy: If you are pregnant, call and cancel the procedure. . Sickness: If you have a cold, fever, or any active infections, call and cancel the procedure. . Arrival: You must be in the facility at least 30 minutes prior to your scheduled procedure. . Children: Do not bring children with you. . Dress appropriately: Bring dark clothing that you would not mind if they get stained. . Valuables: Do not bring any jewelry or valuables.  Procedure appointments are reserved for interventional treatments only. Marland Kitchen No Prescription Refills. . No medication changes will be discussed during procedure appointments. . No disability issues will be discussed.  Reasons to call and reschedule or cancel your procedure: (Following these  recommendations will minimize the risk of a serious complication.) . Surgeries: Avoid having procedures within 2 weeks of any surgery. (Avoid for 2 weeks before or after any surgery). . Flu Shots: Avoid having procedures within 2 weeks of a flu shots or . (Avoid for 2 weeks before or after immunizations). . Barium: Avoid having a procedure within 7-10 days after having had a radiological study involving the use of radiological contrast. (Myelograms, Barium swallow or enema study). . Heart attacks: Avoid any elective procedures or surgeries for the initial 6 months after a "Myocardial Infarction" (Heart Attack). . Blood thinners: It is imperative that you stop these medications before procedures. Let us know if you if you take any blood thinner.  . Infection: Avoid procedures during or within two weeks of an infection (including chest colds or gastrointestinal problems). Symptoms associated with infections include: Localized redness, fever, chills, night sweats or profuse sweating, burning sensation when voiding, cough, congestion, stuffiness, runny nose, sore throat, diarrhea, nausea, vomiting, cold or Flu symptoms, recent or current infections. It is specially important if the infection is over the area that we intend to treat. Marland Kitchen Heart and lung problems: Symptoms that may suggest an active cardiopulmonary problem include: cough, chest pain, breathing difficulties or shortness of breath, dizziness, ankle swelling, uncontrolled high or unusually low blood pressure, and/or palpitations. If you are experiencing any of these symptoms, cancel your procedure and contact your primary care physician for an evaluation.  Remember:  Regular Business hours are:  Monday to Thursday 8:00  AM to 4:00 PM  Provider's Schedule: Milinda Pointer, MD:  Procedure days: Tuesday and Thursday 7:30 AM to 4:00 PM  Gillis Santa, MD:  Procedure days: Monday and Wednesday 7:30 AM to 4:00  PM ____________________________________________________________________________________________

## 2018-03-21 ENCOUNTER — Ambulatory Visit
Admission: RE | Admit: 2018-03-21 | Discharge: 2018-03-21 | Disposition: A | Discharge status: Home / Self Care | Payer: 59 | Source: Ambulatory Visit | Attending: Pain Medicine | Admitting: Pain Medicine

## 2018-03-21 DIAGNOSIS — G8929 Other chronic pain: Secondary | ICD-10-CM

## 2018-03-21 DIAGNOSIS — M25552 Pain in left hip: Principal | ICD-10-CM

## 2018-03-21 DIAGNOSIS — M160 Bilateral primary osteoarthritis of hip: Secondary | ICD-10-CM | POA: Diagnosis not present

## 2018-03-21 DIAGNOSIS — M25551 Pain in right hip: Secondary | ICD-10-CM | POA: Insufficient documentation

## 2018-03-25 ENCOUNTER — Ambulatory Visit: Payer: 59

## 2018-04-02 ENCOUNTER — Other Ambulatory Visit: Payer: Self-pay

## 2018-04-02 ENCOUNTER — Ambulatory Visit
Admission: RE | Admit: 2018-04-02 | Discharge: 2018-04-02 | Disposition: A | Discharge status: Home / Self Care | Payer: 59 | Source: Ambulatory Visit | Attending: Pain Medicine | Admitting: Pain Medicine

## 2018-04-02 ENCOUNTER — Ambulatory Visit (HOSPITAL_BASED_OUTPATIENT_CLINIC_OR_DEPARTMENT_OTHER): Payer: 59 | Admitting: Pain Medicine

## 2018-04-02 ENCOUNTER — Encounter: Payer: Self-pay | Admitting: Pain Medicine

## 2018-04-02 VITALS — BP 140/79 | Temp 97.6°F | Resp 13 | Ht 62.0 in | Wt 160.0 lb

## 2018-04-02 DIAGNOSIS — Z888 Allergy status to other drugs, medicaments and biological substances status: Secondary | ICD-10-CM | POA: Insufficient documentation

## 2018-04-02 DIAGNOSIS — Z882 Allergy status to sulfonamides status: Secondary | ICD-10-CM | POA: Insufficient documentation

## 2018-04-02 DIAGNOSIS — Z90710 Acquired absence of both cervix and uterus: Secondary | ICD-10-CM | POA: Diagnosis not present

## 2018-04-02 DIAGNOSIS — M5030 Other cervical disc degeneration, unspecified cervical region: Secondary | ICD-10-CM | POA: Diagnosis not present

## 2018-04-02 DIAGNOSIS — R51 Headache: Secondary | ICD-10-CM | POA: Diagnosis not present

## 2018-04-02 DIAGNOSIS — Z9049 Acquired absence of other specified parts of digestive tract: Secondary | ICD-10-CM | POA: Diagnosis not present

## 2018-04-02 DIAGNOSIS — G4486 Cervicogenic headache: Secondary | ICD-10-CM

## 2018-04-02 DIAGNOSIS — M47812 Spondylosis without myelopathy or radiculopathy, cervical region: Secondary | ICD-10-CM | POA: Insufficient documentation

## 2018-04-02 DIAGNOSIS — M542 Cervicalgia: Secondary | ICD-10-CM | POA: Insufficient documentation

## 2018-04-02 MED ORDER — FENTANYL CITRATE (PF) 100 MCG/2ML IJ SOLN
25.0000 ug | INTRAMUSCULAR | Status: DC | PRN
  Administered 2018-04-02: 50 ug via INTRAVENOUS
  Filled 2018-04-02: qty 2

## 2018-04-02 MED ORDER — LIDOCAINE HCL 2 % IJ SOLN
20.0000 mL | Freq: Once | INTRAMUSCULAR | Status: AC
  Administered 2018-04-02: 400 mg
  Filled 2018-04-02: qty 40

## 2018-04-02 MED ORDER — LACTATED RINGERS IV SOLN
1000.0000 mL | Freq: Once | INTRAVENOUS | Status: AC
  Administered 2018-04-02: 1000 mL via INTRAVENOUS

## 2018-04-02 MED ORDER — DEXAMETHASONE SODIUM PHOSPHATE 10 MG/ML IJ SOLN
20.0000 mg | Freq: Once | INTRAMUSCULAR | Status: AC
  Administered 2018-04-02: 20 mg
  Filled 2018-04-02: qty 2

## 2018-04-02 MED ORDER — MIDAZOLAM HCL 5 MG/5ML IJ SOLN
1.0000 mg | INTRAMUSCULAR | Status: DC | PRN
  Administered 2018-04-02: 3 mg via INTRAVENOUS
  Filled 2018-04-02: qty 5

## 2018-04-02 MED ORDER — ROPIVACAINE HCL 2 MG/ML IJ SOLN
18.0000 mL | Freq: Once | INTRAMUSCULAR | Status: AC
  Administered 2018-04-02: 18 mL via PERINEURAL
  Filled 2018-04-02: qty 20

## 2018-04-02 NOTE — Progress Notes (Signed)
Patient's Name: Elizabeth Bowers  MRN: [id number]  Referring Provider: Virginia Crews, MD  DOB: 1965-12-26  PCP: Virginia Crews, MD  DOS: 04/02/2018  Note by: Gaspar Cola, MD  Service setting: Ambulatory outpatient  Specialty: Interventional Pain Management  Patient type: Established  Location: [facility] (AMB) Pain Management Facility  Visit type: Interventional Procedure   Primary Reason for Visit: Interventional Pain Management Treatment. CC: Neck Pain  Procedure:          Anesthesia, Analgesia, Anxiolysis:  Type: Cervical Facet Medial Branch Block(s)  #1  Primary Purpose: Diagnostic Region: Posterolateral cervical spine Level: C3, C4, C5, C6, & C7 Medial Branch Level(s). Injecting these levels blocks the C3-4, C4-5, C5-6, and C6-7 cervical facet joints. Laterality: Bilateral  Type: Moderate (Conscious) Sedation combined with Local Anesthesia Indication(s): Analgesia and Anxiety Route: Intravenous (IV) IV Access: Secured Sedation: Meaningful verbal contact was maintained at all times during the procedure  Local Anesthetic: Lidocaine 1-2%  Position: Prone with head of the table raised to facilitate breathing.   Indications: 1. Spondylosis without myelopathy or radiculopathy, cervical region   2. Cervicalgia (Bilateral)   3. Cervical facet syndrome (Bilateral)   4. DDD (degenerative disc disease), cervical   5. Cervicogenic headache (Bilateral)    Pain Score: Pre-procedure: 2 /10 Post-procedure: 0-No pain/10  Pre-op Assessment:  Elizabeth Bowers is a 52 y.o. (year old), female patient, seen today for interventional treatment. She  has a past surgical history that includes Elbow Debridement; Foot surgery; Cholecystectomy; Colonoscopy; Abdominal hysterectomy; and Esophagogastroduodenoscopy (egd) with propofol (N/A, 02/06/2018). Elizabeth Bowers has a current medication list which includes the following prescription(s): acyclovir ointment, cyclobenzaprine, diazepam, docusate sodium,  duloxetine, lansoprazole, linzess, magnesium oxide, meclizine, montelukast, multivitamin with minerals, promethazine, triamterene-hydrochlorothiazide, valacyclovir, vitamin c, levocetirizine, and potassium chloride sa, and the following Facility-Administered Medications: fentanyl and midazolam. Her primarily concern today is the Neck Pain  The patient has indicated that she would like to get started on treating her lower back next.  However, we first need to get information as to how she does with today's diagnostic injection first.  Initial Vital Signs:  Pulse/HCG Rate:  ECG Heart Rate: 61 Temp: 98.4 F (36.9 C) Resp: 14 BP: 135/80 SpO2: 100 %  BMI: Estimated body mass index is 29.26 kg/m as calculated from the following:   Height as of this encounter: 5\' 2"  (1.575 m).   Weight as of this encounter: 160 lb (72.6 kg).  Risk Assessment: Allergies: Reviewed. She is allergic to ranitidine; amphetamine-dextroamphetamine; lactose intolerance (gi); reglan [metoclopramide]; topiramate er; wheat bran; and sulfa antibiotics.  Allergy Precautions: None required Coagulopathies: Reviewed. None identified.  Blood-thinner therapy: None at this time Active Infection(s): Reviewed. None identified. Elizabeth Bowers is afebrile  Site Confirmation: Elizabeth Bowers was asked to confirm the procedure and laterality before marking the site Procedure checklist: Completed Consent: Before the procedure and under the influence of no sedative(s), amnesic(s), or anxiolytics, the patient was informed of the treatment options, risks and possible complications. To fulfill our ethical and legal obligations, as recommended by the American Medical Association's Code of Ethics, I have informed the patient of my clinical impression; the nature and purpose of the treatment or procedure; the risks, benefits, and possible complications of the intervention; the alternatives, including doing nothing; the risk(s) and benefit(s) of the alternative  treatment(s) or procedure(s); and the risk(s) and benefit(s) of doing nothing. The patient was provided information about the general risks and possible complications associated with the procedure. These may include,  but are not limited to: failure to achieve desired goals, infection, bleeding, organ or nerve damage, allergic reactions, paralysis, and death. In addition, the patient was informed of those risks and complications associated to Spine-related procedures, such as failure to decrease pain; infection (i.e.: Meningitis, epidural or intraspinal abscess); bleeding (i.e.: epidural hematoma, subarachnoid hemorrhage, or any other type of intraspinal or peri-dural bleeding); organ or nerve damage (i.e.: Any type of peripheral nerve, nerve root, or spinal cord injury) with subsequent damage to sensory, motor, and/or autonomic systems, resulting in permanent pain, numbness, and/or weakness of one or several areas of the body; allergic reactions; (i.e.: anaphylactic reaction); and/or death. Furthermore, the patient was informed of those risks and complications associated with the medications. These include, but are not limited to: allergic reactions (i.e.: anaphylactic or anaphylactoid reaction(s)); adrenal axis suppression; blood sugar elevation that in diabetics may result in ketoacidosis or comma; water retention that in patients with history of congestive heart failure may result in shortness of breath, pulmonary edema, and decompensation with resultant heart failure; weight gain; swelling or edema; medication-induced neural toxicity; particulate matter embolism and blood vessel occlusion with resultant organ, and/or nervous system infarction; and/or aseptic necrosis of one or more joints. Finally, the patient was informed that Medicine is not an exact science; therefore, there is also the possibility of unforeseen or unpredictable risks and/or possible complications that may result in a catastrophic  outcome. The patient indicated having understood very clearly. We have given the patient no guarantees and we have made no promises. Enough time was given to the patient to ask questions, all of which were answered to the patient's satisfaction. Elizabeth Bowers has indicated that she wanted to continue with the procedure. Attestation: I, the ordering provider, attest that I have discussed with the patient the benefits, risks, side-effects, alternatives, likelihood of achieving goals, and potential problems during recovery for the procedure that I have provided informed consent. Date  Time: 04/02/2018  8:53 AM  Pre-Procedure Preparation:  Monitoring: As per clinic protocol. Respiration, ETCO2, SpO2, BP, heart rate and rhythm monitor placed and checked for adequate function Safety Precautions: Patient was assessed for positional comfort and pressure points before starting the procedure. Time-out: I initiated and conducted the "Time-out" before starting the procedure, as per protocol. The patient was asked to participate by confirming the accuracy of the "Time Out" information. Verification of the correct person, site, and procedure were performed and confirmed by me, the nursing staff, and the patient. "Time-out" conducted as per Joint Commission's Universal Protocol (UP.01.01.01). Time: 6789  Description of Procedure:          Laterality: Bilateral. The procedure was performed in identical fashion on both sides. Level: C3, C4, C5, C6, & C7 Medial Branch Level(s). Area Prepped: Posterior Cervico-thoracic Region Prepping solution: ChloraPrep (2% chlorhexidine gluconate and 70% isopropyl alcohol) Safety Precautions: Aspiration looking for blood return was conducted prior to all injections. At no point did we inject any substances, as a needle was being advanced. Before injecting, the patient was told to immediately notify me if she was experiencing any new onset of "ringing in the ears, or metallic taste in the  mouth". No attempts were made at seeking any paresthesias. Safe injection practices and needle disposal techniques used. Medications properly checked for expiration dates. SDV (single dose vial) medications used. After the completion of the procedure, all disposable equipment used was discarded in the proper designated medical waste containers. Local Anesthesia: Protocol guidelines were followed. The patient was positioned over  the fluoroscopy table. The area was prepped in the usual manner. The time-out was completed. The target area was identified using fluoroscopy. A 12-in long, straight, sterile hemostat was used with fluoroscopic guidance to locate the targets for each level blocked. Once located, the skin was marked with an approved surgical skin marker. Once all sites were marked, the skin (epidermis, dermis, and hypodermis), as well as deeper tissues (fat, connective tissue and muscle) were infiltrated with a small amount of a short-acting local anesthetic, loaded on a 10cc syringe with a 25G, 1.5-in  Needle. An appropriate amount of time was allowed for local anesthetics to take effect before proceeding to the next step. Local Anesthetic: Lidocaine 2.0% The unused portion of the local anesthetic was discarded in the proper designated containers. Technical explanation of process:  C3 Medial Branch Nerve Block (MBB): The target area for the C3 dorsal medial articular branch is the lateral concave waist of the articular pillar of C3. Under fluoroscopic guidance, a Quincke needle was inserted until contact was made with os over the postero-lateral aspect of the articular pillar of C3 (target area). After negative aspiration for blood, 0.5 mL of the nerve block solution was injected without difficulty or complication. The needle was removed intact. C4 Medial Branch Nerve Block (MBB): The target area for the C4 dorsal medial articular branch is the lateral concave waist of the articular pillar of C4. Under  fluoroscopic guidance, a Quincke needle was inserted until contact was made with os over the postero-lateral aspect of the articular pillar of C4 (target area). After negative aspiration for blood, 0.5 mL of the nerve block solution was injected without difficulty or complication. The needle was removed intact. C5 Medial Branch Nerve Block (MBB): The target area for the C5 dorsal medial articular branch is the lateral concave waist of the articular pillar of C5. Under fluoroscopic guidance, a Quincke needle was inserted until contact was made with os over the postero-lateral aspect of the articular pillar of C5 (target area). After negative aspiration for blood, 0.5 mL of the nerve block solution was injected without difficulty or complication. The needle was removed intact. C6 Medial Branch Nerve Block (MBB): The target area for the C6 dorsal medial articular branch is the lateral concave waist of the articular pillar of C6. Under fluoroscopic guidance, a Quincke needle was inserted until contact was made with os over the postero-lateral aspect of the articular pillar of C6 (target area). After negative aspiration for blood, 0.5 mL of the nerve block solution was injected without difficulty or complication. The needle was removed intact. C7 Medial Branch Nerve Block (MBB): The target for the C7 dorsal medial articular branch lies on the superior-medial tip of the C7 transverse process. Under fluoroscopic guidance, a Quincke needle was inserted until contact was made with os over the postero-lateral aspect of the articular pillar of C7 (target area). After negative aspiration for blood, 0.5 mL of the nerve block solution was injected without difficulty or complication. The needle was removed intact. Procedural Needles: 22-gauge, 3.5-inch, Quincke needles used for all levels. Nerve block solution: 0.2% PF-Ropivacaine + Triamcinolone (40 mg/mL) diluted to a final concentration of 4 mg of Triamcinolone/mL of  Ropivacaine The unused portion of the solution was discarded in the proper designated containers.  Once the entire procedure was completed, the treated area was cleaned, making sure to leave some of the prepping solution back to take advantage of its long term bactericidal properties.  Vitals:   04/02/18 1003 04/02/18  1012 04/02/18 1022 04/02/18 1035  BP: 125/78 (!) 146/69 (!) 145/75 140/79  Resp: 13 20 (!) 28 13  Temp:  97.6 F (36.4 C)  97.6 F (36.4 C)  TempSrc:  Temporal  Temporal  SpO2: 100% 97% 97% 96%  Weight:      Height:        Start Time: 0952 hrs. End Time: 1003 hrs.  Imaging Guidance (Spinal):          Type of Imaging Technique: Fluoroscopy Guidance (Spinal) Indication(s): Assistance in needle guidance and placement for procedures requiring needle placement in or near specific anatomical locations not easily accessible without such assistance. Exposure Time: Please see nurses notes. Contrast: None used. Fluoroscopic Guidance: I was personally present during the use of fluoroscopy. "Tunnel Vision Technique" used to obtain the best possible view of the target area. Parallax error corrected before commencing the procedure. "Direction-depth-direction" technique used to introduce the needle under continuous pulsed fluoroscopy. Once target was reached, antero-posterior, oblique, and lateral fluoroscopic projection used confirm needle placement in all planes. Images permanently stored in EMR. Interpretation: No contrast injected. I personally interpreted the imaging intraoperatively. Adequate needle placement confirmed in multiple planes. Permanent images saved into the patient's record.  Antibiotic Prophylaxis:   Anti-infectives (From admission, onward)   None     Indication(s): None identified  Post-operative Assessment:  Post-procedure Vital Signs:  Pulse/HCG Rate:  (!) 56 Temp: 97.6 F (36.4 C) Resp: 13 BP: 140/79 SpO2: 96 %  EBL: None  Complications: No  immediate post-treatment complications observed by team, or reported by patient.  Note: The patient tolerated the entire procedure well. A repeat set of vitals were taken after the procedure and the patient was kept under observation following institutional policy, for this type of procedure. Post-procedural neurological assessment was performed, showing return to baseline, prior to discharge. The patient was provided with post-procedure discharge instructions, including a section on how to identify potential problems. Should any problems arise concerning this procedure, the patient was given instructions to immediately contact us, at any time, without hesitation. In any case, we plan to contact the patient by telephone for a follow-up status report regarding this interventional procedure.  Comments:  No additional relevant information.  Plan of Care   Imaging Orders     DG C-Arm 1-60 Min-No Report  Procedure Orders     CERVICAL FACET (MEDIAL BRANCH NERVE BLOCK)   Medications ordered for procedure: Meds ordered this encounter  Medications  . lidocaine (XYLOCAINE) 2 % (with pres) injection 400 mg  . midazolam (VERSED) 5 MG/5ML injection 1-2 mg    Make sure Flumazenil is available in the pyxis when using this medication. If oversedation occurs, administer 0.2 mg IV over 15 sec. If after 45 sec no response, administer 0.2 mg again over 1 min; may repeat at 1 min intervals; not to exceed 4 doses (1 mg)  . fentaNYL (SUBLIMAZE) injection 25-50 mcg    Make sure Narcan is available in the pyxis when using this medication. In the event of respiratory depression (RR< 8/min): Titrate NARCAN (naloxone) in increments of 0.1 to 0.2 mg IV at 2-3 minute intervals, until desired degree of reversal.  . lactated ringers infusion 1,000 mL  . ropivacaine (PF) 2 mg/mL (0.2%) (NAROPIN) injection 18 mL  . dexamethasone (DECADRON) injection 20 mg   Medications administered: We administered lidocaine, midazolam,  fentaNYL, lactated ringers, ropivacaine (PF) 2 mg/mL (0.2%), and dexamethasone.  See the medical record for exact dosing, route, and time of  administration.  Disposition: Discharge home  Discharge Date & Time: 04/02/2018; 1036 hrs.   Physician-requested Follow-up: Return for post-procedure eval (2 wks), w/ Dr. Dossie Arbour.  Future Appointments  Date Time Provider [place]  04/09/2018  9:00 AM [place], [place] T, [place] [facility]  04/17/2018  8:15 AM Milinda Pointer, MD [facility] None  08/21/2018  8:40 AM [place], [name], MD [facility] None   Primary Care Physician: Virginia Crews, MD Location: [facility] Note by: Gaspar Cola, MD Date: 04/02/2018; Time: 11:03 AM  Disclaimer:  Medicine is not an Chief Strategy Officer. The only guarantee in medicine is that nothing is guaranteed. It is important to note that the decision to proceed with this intervention was based on the information collected from the patient. The Data and conclusions were drawn from the patient's questionnaire, the interview, and the physical examination. Because the information was provided in large part by the patient, it cannot be guaranteed that it has not been purposely or unconsciously manipulated. Every effort has been made to obtain as much relevant data as possible for this evaluation. It is important to note that the conclusions that lead to this procedure are derived in large part from the available data. Always take into account that the treatment will also be dependent on availability of resources and existing treatment guidelines, considered by other Pain Management Practitioners as being common knowledge and practice, at the time of the intervention. For Medico-Legal purposes, it is also important to point out that variation in procedural techniques and pharmacological choices are the acceptable norm. The indications, contraindications, technique, and results of the above  procedure should only be interpreted and judged by a Board-Certified Interventional Pain Specialist with extensive familiarity and expertise in the same exact procedure and technique.

## 2018-04-02 NOTE — Progress Notes (Signed)
Safety precautions to be maintained throughout the outpatient stay will include: orient to surroundings, keep bed in low position, maintain call bell within reach at all times, provide assistance with transfer out of bed and ambulation.  

## 2018-04-02 NOTE — Patient Instructions (Signed)

## 2018-04-03 ENCOUNTER — Telehealth: Payer: Self-pay

## 2018-04-03 NOTE — Telephone Encounter (Signed)
Post procedure phone call.  Patient states she is doing well.  

## 2018-04-09 ENCOUNTER — Ambulatory Visit (INDEPENDENT_AMBULATORY_CARE_PROVIDER_SITE_OTHER): Payer: 59 | Admitting: Podiatry

## 2018-04-09 ENCOUNTER — Encounter: Payer: Self-pay | Admitting: Podiatry

## 2018-04-09 ENCOUNTER — Ambulatory Visit: Payer: Self-pay

## 2018-04-09 VITALS — BP 139/86 | HR 85 | Resp 16

## 2018-04-09 DIAGNOSIS — M722 Plantar fascial fibromatosis: Secondary | ICD-10-CM

## 2018-04-09 MED ORDER — MELOXICAM 15 MG PO TABS: 15.0000 mg | ORAL_TABLET | Freq: Every day | ORAL | 3 refills | Status: DC

## 2018-04-09 MED ORDER — METHYLPREDNISOLONE 4 MG PO TBPK: ORAL_TABLET | ORAL | 0 refills | Status: DC

## 2018-04-09 NOTE — Progress Notes (Signed)
Subjective:  Patient ID: [place], female    DOB: 1966/01/17,  MRN: [id number] HPI Chief Complaint  Patient presents with  . Foot Pain    Plantar heel left - aching x several months, AM pain, shows bone spur from xrays done in August, tried rest  . Skin Problem    Asking for recommendations for dry skin on feet  . New Patient (Initial Visit)    52 y.o. female presents with the above complaint.   ROS: Denies fever chills nausea vomiting muscle aches pains calf pain back pain chest pain shortness of breath.  Past Medical History:  Diagnosis Date  . Fatty liver   . Fibromyalgia   . GERD (gastroesophageal reflux disease)   . Hemorrhoids   . Hot flashes   . Hypertension   . IBS (irritable bowel syndrome)   . Migraines   . Miscarriage    x 2; 4 live births   . Multilevel degenerative disc disease   . Vocal cord dysfunction    Past Surgical History:  Procedure Laterality Date  . ABDOMINAL HYSTERECTOMY     2012 removed cervix h/o abnormal pap   . CHOLECYSTECTOMY     2000  . COLONOSCOPY     2018 with + polpys and GIB 2/2 polyp removal   . ELBOW DEBRIDEMENT    . ESOPHAGOGASTRODUODENOSCOPY (EGD) WITH PROPOFOL N/A 02/06/2018   Procedure: ESOPHAGOGASTRODUODENOSCOPY (EGD) WITH PROPOFOL with biopsies;  Surgeon: Lin Landsman, MD;  Location: [place];  Service: Endoscopy;  Laterality: N/A;  . FOOT SURGERY      Current Outpatient Medications:  .  docusate sodium (COLACE) 100 MG capsule, Take 200 mg by mouth at bedtime. , Disp: , Rfl:  .  DULoxetine (CYMBALTA) 60 MG capsule, TAKE 1 CAPSULE (60 MG TOTAL) BY MOUTH DAILY., Disp: 30 capsule, Rfl: 11 .  lansoprazole (PREVACID) 30 MG capsule, TAKE 1 CAPSULE BY MOUTH TWICE DAILY, Disp: 60 capsule, Rfl: 2 .  LINZESS 290 MCG CAPS capsule, Take 1 capsule (290 mcg total) by mouth daily., Disp: 90 capsule, Rfl: 3 .  Magnesium Oxide 500 MG CAPS, Take 1 capsule (500 mg total) by mouth at bedtime as needed and may repeat dose  one time if needed., Disp: 90 capsule, Rfl: 0 .  Melatonin 10 MG CAPS, Take by mouth., Disp: , Rfl:  .  montelukast (SINGULAIR) 10 MG tablet, Take 1 tablet (10 mg total) by mouth at bedtime., Disp: 90 tablet, Rfl: 3 .  Multiple Vitamin (MULTIVITAMIN WITH MINERALS) TABS tablet, Take 1 tablet by mouth daily., Disp: , Rfl:  .  phentermine 37.5 MG capsule, Take 37.5 mg by mouth every morning., Disp: , Rfl:  .  potassium chloride SA (K-DUR,KLOR-CON) 20 MEQ tablet, Take 1 tablet (20 mEq total) by mouth daily., Disp: 30 tablet, Rfl: 5 .  triamterene-hydrochlorothiazide (MAXZIDE-25) 37.5-25 MG tablet, Take 1 tablet by mouth daily., Disp: 90 tablet, Rfl: 0 .  vitamin C (ASCORBIC ACID) 500 MG tablet, Take 500 mg by mouth daily., Disp: , Rfl:  .  meloxicam (MOBIC) 15 MG tablet, Take 1 tablet (15 mg total) by mouth daily., Disp: 30 tablet, Rfl: 3 .  methylPREDNISolone (MEDROL DOSEPAK) 4 MG TBPK tablet, 6 day dose pack - take as directed, Disp: 21 tablet, Rfl: 0  Allergies  Allergen Reactions  . Ranitidine Diarrhea and Other (See Comments)    Diarrhea and stomach cramping  . Amphetamine-Dextroamphetamine Other (See Comments)    Elevated BP  . Lactose Intolerance (Gi)  bloating  . Reglan [Metoclopramide] Other (See Comments)    Increased prolactin level   . Topiramate Er Other (See Comments)    Severe headache, nausea, vomiting, dizziness, visual disturbance    . Wheat Bran     bloating  . Sulfa Antibiotics Anxiety   Review of Systems Objective:   Vitals:   04/09/18 0909  BP: 139/86  Pulse: 85  Resp: 16    General: Well developed, nourished, in no acute distress, alert and oriented x3   Dermatological: Skin is warm, dry and supple bilateral. Nails x 10 are well maintained; remaining integument appears unremarkable at this time. There are no open sores, no preulcerative lesions, no rash or signs of infection present.  Dry xerotic skin plantar aspect of bilateral foot cracked  fissures.  Vascular: Dorsalis Pedis artery and Posterior Tibial artery pedal pulses are 2/4 bilateral with immedate capillary fill time. Pedal hair growth present. No varicosities and no lower extremity edema present bilateral.   Neruologic: Grossly intact via light touch bilateral. Vibratory intact via tuning fork bilateral. Protective threshold with Semmes Wienstein monofilament intact to all pedal sites bilateral. Patellar and Achilles deep tendon reflexes 2+ bilateral. No Babinski or clonus noted bilateral.   Musculoskeletal: No gross boney pedal deformities bilateral. No pain, crepitus, or limitation noted with foot and ankle range of motion bilateral. Muscular strength 5/5 in all groups tested bilateral.  Pain on palpation medial calcaneal tubercles bilateral.  No pain on medial lateral compression of the calcaneus.  Gait: Unassisted, Nonantalgic.    Radiographs:  Radiographs reviewed today demonstrate left foot only plantar distal calcaneal heel spur soft tissue increase in density plantar fashion calcaneal insertion site indicative of plantar fasciitis.  Assessment & Plan:   Assessment: Plantar fasciitis bilateral.  Xerosis bilateral foot.  Plan: After sterile Betadine skin prep I injected 20 mg Kenalog 5 mg Marcaine medial aspect of the bilateral heel plantar fashion calcaneal insertion site tolerated procedure well.  Start her on a Medrol Dosepak to be followed by meloxicam provided her nephrologist will allow her to take that.  Also discussed appropriate shoe gear stretching exercise ice therapy sugar modifications start her in plantar fascial brace and a night splint bilateral foot.     Emalee Knies T. [place], [place]

## 2018-04-09 NOTE — Patient Instructions (Signed)
For instructions on how to put on your Night Splint, please visit [email or web address]   Plantar Fasciitis (Heel Spur Syndrome) with Rehab The plantar fascia is a fibrous, ligament-like, soft-tissue structure that spans the bottom of the foot. Plantar fasciitis is a condition that causes pain in the foot due to inflammation of the tissue. SYMPTOMS   Pain and tenderness on the underneath side of the foot.  Pain that worsens with standing or walking. CAUSES  Plantar fasciitis is caused by irritation and injury to the plantar fascia on the underneath side of the foot. Common mechanisms of injury include:  Direct trauma to bottom of the foot.  Damage to a small nerve that runs under the foot where the main fascia attaches to the heel bone.  Stress placed on the plantar fascia due to bone spurs. RISK INCREASES WITH:   Activities that place stress on the plantar fascia (running, jumping, pivoting, or cutting).  Poor strength and flexibility.  Improperly fitted shoes.  Tight calf muscles.  Flat feet.  Failure to warm-up properly before activity.  Obesity. PREVENTION  Warm up and stretch properly before activity.  Allow for adequate recovery between workouts.  Maintain physical fitness:  Strength, flexibility, and endurance.  Cardiovascular fitness.  Maintain a health body weight.  Avoid stress on the plantar fascia.  Wear properly fitted shoes, including arch supports for individuals who have flat feet.  PROGNOSIS  If treated properly, then the symptoms of plantar fasciitis usually resolve without surgery. However, occasionally surgery is necessary.  RELATED COMPLICATIONS   Recurrent symptoms that may result in a chronic condition.  Problems of the lower back that are caused by compensating for the injury, such as limping.  Pain or weakness of the foot during push-off following surgery.  Chronic inflammation, scarring, and partial or complete fascia tear,  occurring more often from repeated injections.  TREATMENT  Treatment initially involves the use of ice and medication to help reduce pain and inflammation. The use of strengthening and stretching exercises may help reduce pain with activity, especially stretches of the Achilles tendon. These exercises may be performed at home or with a therapist. Your caregiver may recommend that you use heel cups of arch supports to help reduce stress on the plantar fascia. Occasionally, corticosteroid injections are given to reduce inflammation. If symptoms persist for greater than 6 months despite non-surgical (conservative), then surgery may be recommended.   MEDICATION   If pain medication is necessary, then nonsteroidal anti-inflammatory medications, such as aspirin and ibuprofen, or other minor pain relievers, such as acetaminophen, are often recommended.  Do not take pain medication within 7 days before surgery.  Prescription pain relievers may be given if deemed necessary by your caregiver. Use only as directed and only as much as you need.  Corticosteroid injections may be given by your caregiver. These injections should be reserved for the most serious cases, because they may only be given a certain number of times.  HEAT AND COLD  Cold treatment (icing) relieves pain and reduces inflammation. Cold treatment should be applied for 10 to 15 minutes every 2 to 3 hours for inflammation and pain and immediately after any activity that aggravates your symptoms. Use ice packs or massage the area with a piece of ice (ice massage).  Heat treatment may be used prior to performing the stretching and strengthening activities prescribed by your caregiver, physical therapist, or athletic trainer. Use a heat pack or soak the injury in warm water.  SEEK IMMEDIATE MEDICAL CARE   IF:  Treatment seems to offer no benefit, or the condition worsens.  Any medications produce adverse side effects.  EXERCISES- RANGE OF  MOTION (ROM) AND STRETCHING EXERCISES - Plantar Fasciitis (Heel Spur Syndrome) These exercises may help you when beginning to rehabilitate your injury. Your symptoms may resolve with or without further involvement from your physician, physical therapist or athletic trainer. While completing these exercises, remember:   Restoring tissue flexibility helps normal motion to return to the joints. This allows healthier, less painful movement and activity.  An effective stretch should be held for at least 30 seconds.  A stretch should never be painful. You should only feel a gentle lengthening or release in the stretched tissue.  RANGE OF MOTION - Toe Extension, Flexion  Sit with your right / left leg crossed over your opposite knee.  Grasp your toes and gently pull them back toward the top of your foot. You should feel a stretch on the bottom of your toes and/or foot.  Hold this stretch for 10 seconds.  Now, gently pull your toes toward the bottom of your foot. You should feel a stretch on the top of your toes and or foot.  Hold this stretch for 10 seconds. Repeat  times. Complete this stretch 3 times per day.   RANGE OF MOTION - Ankle Dorsiflexion, Active Assisted  Remove shoes and sit on a chair that is preferably not on a carpeted surface.  Place right / left foot under knee. Extend your opposite leg for support.  Keeping your heel down, slide your right / left foot back toward the chair until you feel a stretch at your ankle or calf. If you do not feel a stretch, slide your bottom forward to the edge of the chair, while still keeping your heel down.  Hold this stretch for 10 seconds. Repeat 3 times. Complete this stretch 2 times per day.   STRETCH  Gastroc, Standing  Place hands on wall.  Extend right / left leg, keeping the front knee somewhat bent.  Slightly point your toes inward on your back foot.  Keeping your right / left heel on the floor and your knee straight, shift  your weight toward the wall, not allowing your back to arch.  You should feel a gentle stretch in the right / left calf. Hold this position for 10 seconds. Repeat 3 times. Complete this stretch 2 times per day.  STRETCH  Soleus, Standing  Place hands on wall.  Extend right / left leg, keeping the other knee somewhat bent.  Slightly point your toes inward on your back foot.  Keep your right / left heel on the floor, bend your back knee, and slightly shift your weight over the back leg so that you feel a gentle stretch deep in your back calf.  Hold this position for 10 seconds. Repeat 3 times. Complete this stretch 2 times per day.  STRETCH  Gastrocsoleus, Standing  Note: This exercise can place a lot of stress on your foot and ankle. Please complete this exercise only if specifically instructed by your caregiver.   Place the ball of your right / left foot on a step, keeping your other foot firmly on the same step.  Hold on to the wall or a rail for balance.  Slowly lift your other foot, allowing your body weight to press your heel down over the edge of the step.  You should feel a stretch in your right / left calf.  Hold this position   for 10 seconds.  Repeat this exercise with a slight bend in your right / left knee. Repeat 3 times. Complete this stretch 2 times per day.   STRENGTHENING EXERCISES - Plantar Fasciitis (Heel Spur Syndrome)  These exercises may help you when beginning to rehabilitate your injury. They may resolve your symptoms with or without further involvement from your physician, physical therapist or athletic trainer. While completing these exercises, remember:   Muscles can gain both the endurance and the strength needed for everyday activities through controlled exercises.  Complete these exercises as instructed by your physician, physical therapist or athletic trainer. Progress the resistance and repetitions only as guided.  STRENGTH - Towel Curls  Sit in  a chair positioned on a non-carpeted surface.  Place your foot on a towel, keeping your heel on the floor.  Pull the towel toward your heel by only curling your toes. Keep your heel on the floor. Repeat 3 times. Complete this exercise 2 times per day.  STRENGTH - Ankle Inversion  Secure one end of a rubber exercise band/tubing to a fixed object (table, pole). Loop the other end around your foot just before your toes.  Place your fists between your knees. This will focus your strengthening at your ankle.  Slowly, pull your big toe up and in, making sure the band/tubing is positioned to resist the entire motion.  Hold this position for 10 seconds.  Have your muscles resist the band/tubing as it slowly pulls your foot back to the starting position. Repeat 3 times. Complete this exercises 2 times per day.  Document Released: 05/22/2005 Document Revised: 08/14/2011 Document Reviewed: 09/03/2008 [employer] Patient Information 2014 [employer].  

## 2018-04-11 ENCOUNTER — Telehealth: Payer: Self-pay | Admitting: Podiatry

## 2018-04-11 ENCOUNTER — Other Ambulatory Visit: Payer: Self-pay | Admitting: Family Medicine

## 2018-04-11 ENCOUNTER — Ambulatory Visit: Admission: RE | Admit: 2018-04-11 | Payer: 59 | Source: Ambulatory Visit

## 2018-04-11 DIAGNOSIS — Z1231 Encounter for screening mammogram for malignant neoplasm of breast: Secondary | ICD-10-CM

## 2018-04-11 DIAGNOSIS — N183 Chronic kidney disease, stage 3 (moderate): Secondary | ICD-10-CM | POA: Diagnosis not present

## 2018-04-11 DIAGNOSIS — I10 Essential (primary) hypertension: Secondary | ICD-10-CM | POA: Diagnosis not present

## 2018-04-11 MED ORDER — NONFORMULARY OR COMPOUNDED ITEM: 3 refills | Status: DC

## 2018-04-11 NOTE — Telephone Encounter (Signed)
Pain cream from [facility].

## 2018-04-11 NOTE — Telephone Encounter (Signed)
I saw Dr. Milinda Pointer earlier this week and he wanted me to take an NSAID for the swelling in my foot. My nephrologist does not want me taking the NSAID. Dr. Milinda Pointer said he could call in a prescription for some cream for my foot so I'm calling to request the cream be called into [place]. My phone number is [phone].

## 2018-04-11 NOTE — Telephone Encounter (Signed)
Faxed orders to [facility]. 

## 2018-04-11 NOTE — Telephone Encounter (Signed)
I informed pt of Dr. Stephenie Acres orders for [facility] [phone], they would call with cost and delivery information.

## 2018-04-11 NOTE — Addendum Note (Signed)
Addended by: Harriett Sine D on: 04/11/2018 05:57 PM   Modules accepted: Orders

## 2018-04-15 NOTE — Progress Notes (Signed)
Patient's Name: Elizabeth Bowers  MRN: [id number]  Referring Provider: Virginia Crews, MD  DOB: Jan 07, 1966  PCP: Virginia Crews, MD  DOS: 04/17/2018  Note by: Gaspar Cola, MD  Service setting: Ambulatory outpatient  Specialty: Interventional Pain Management  Location: [facility] (AMB) Pain Management Facility    Patient type: Established   Primary Reason(s) for Visit: Encounter for post-procedure evaluation of chronic illness with mild to moderate exacerbation CC: Neck Pain  HPI  [name] is a 52 y.o. year old, female patient, who comes today for a post-procedure evaluation. She has Rectal bleeding; Chronic sacroiliac joint pain (Right); Hypertension; Superficial thrombophlebitis; Fatty liver; Colon polyps; Fibromyalgia; Sinusitis, chronic; Allergic rhinitis; IBS (irritable bowel syndrome); Chronic GERD; Migraine; Eyelid dermatitis, allergic/contact; Anxiety; Chronic knee pain (Right); DDD (degenerative disc disease), lumbar; Family hx of colon cancer; LUQ abdominal pain; Overweight; Chronic low back pain (Primary Area of Pain) (Bilateral) (L>R) w/ sciatica (Bilateral); Chronic lower extremity pain (Secondary Area of Pain) (Bilateral) (L>R); Chronic neck pain (Tertiary Area of Pain) (midline); Chronic foot pain (Left); Chronic pain syndrome; Pharmacologic therapy; Disorder of skeletal system; Problems influencing health status; Dyspepsia; Ganglion cyst; Chronic upper extremity pain (Fourth Area of Pain) (Bilateral) (R>L); Cervical Grade 1 (2 mm) Retrolisthesis C5 over C6; Cervical foraminal stenosis (C3-4 & C5-6) (Bilateral); DDD (degenerative disc disease), cervical; Long term prescription benzodiazepine use; CKD (chronic kidney disease) stage 3, GFR 30-59 ml/min ([place]); Acid reflux disease; Spondylosis without myelopathy or radiculopathy, cervical region; Chronic hip pain (Bilateral) (L>R); Lumbar facet syndrome (Bilateral) (R>L); Spondylosis without myelopathy or radiculopathy, lumbar region;  Chronic sacroiliac joint dysfunction (Right); Chronic somatic sacroiliac joint dysfunction (Right); Osteoarthritis of hip (Bilateral); Cervicogenic headache (Bilateral); Cervicalgia (Bilateral); Cervical facet syndrome (Bilateral); and Chronic occipital neuralgia (Bilateral) on their problem list. Her primarily concern today is the Neck Pain  Pain Assessment: Location: Right, Left Neck Radiating: denies Onset: More than a month ago Duration: Chronic pain Quality: Aching Severity: 1 /10 (subjective, self-reported pain score)  Note: Reported level is compatible with observation.                         When using our objective Pain Scale, levels between 6 and 10/10 are said to belong in an emergency room, as it progressively worsens from a 6/10, described as severely limiting, requiring emergency care not usually available at an outpatient pain management facility. At a 6/10 level, communication becomes difficult and requires great effort. Assistance to reach the emergency department may be required. Facial flushing and profuse sweating along with potentially dangerous increases in heart rate and blood pressure will be evident. Effect on ADL: limits my daily activities Timing: Constant Modifying factors: rest BP: 132/86  HR: 65  [name] comes in today for post-procedure evaluation.  Further details on both, my assessment(s), as well as the proposed treatment plan, please see below.  Post-Procedure Assessment  04/02/2018 Procedure: Diagnostic bilateral cervical facet block #1 under fluoroscopic guidance and IV sedation Pre-procedure pain score:  2/10 Post-procedure pain score: 0/10 (100% relief) Influential Factors: BMI: 28.35 kg/m Intra-procedural challenges: None observed.         Assessment challenges: None detected.              Reported side-effects: None.        Post-procedural adverse reactions or complications: Transient increase in pain. (x 3-4 days)  Sedation: Please see  nurses note. When no sedatives are used, the analgesic levels obtained are directly  associated to the effectiveness of the local anesthetics. However, when sedation is provided, the level of analgesia obtained during the initial 1 hour following the intervention, is believed to be the result of a combination of factors. These factors may include, but are not limited to: 1. The effectiveness of the local anesthetics used. 2. The effects of the analgesic(s) and/or anxiolytic(s) used. 3. The degree of discomfort experienced by the patient at the time of the procedure. 4. The patients ability and reliability in recalling and recording the events. 5. The presence and influence of possible secondary gains and/or psychosocial factors. Reported result: Relief experienced during the 1st hour after the procedure: 100 % (Ultra-Short Term Relief)            Interpretative annotation: Clinically appropriate result. Analgesia during this period is likely to be Local Anesthetic and/or IV Sedative (Analgesic/Anxiolytic) related.          Effects of local anesthetic: The analgesic effects attained during this period are directly associated to the localized infiltration of local anesthetics and therefore cary significant diagnostic value as to the etiological location, or anatomical origin, of the pain. Expected duration of relief is directly dependent on the pharmacodynamics of the local anesthetic used. Long-acting (4-6 hours) anesthetics used.  Reported result: Relief during the next 4 to 6 hour after the procedure: 100 %(for 4 to 5 hours) (Short-Term Relief)            Interpretative annotation: Clinically appropriate result. Analgesia during this period is likely to be Local Anesthetic-related.          Long-term benefit: Defined as the period of time past the expected duration of local anesthetics (1 hour for short-acting and 4-6 hours for long-acting). With the possible exception of prolonged sympathetic blockade  from the local anesthetics, benefits during this period are typically attributed to, or associated with, other factors such as analgesic sensory neuropraxia, antiinflammatory effects, or beneficial biochemical changes provided by agents other than the local anesthetics.  Reported result: Extended relief following procedure: 100 % (Long-Term Relief)            Interpretative annotation: Clinically possible results. Good relief. No permanent benefit expected. Inflammation plays a part in the etiology to the pain. Etiology is likely inflammatory and mechanical.  Current benefits: Defined as reported results that persistent at this point in time.   Analgesia: 90 % Ms. [place] reports improvement of axial symptoms.  Headaches also improved significantly and while the local anesthetic was ineffective, she had no headaches.  This confirms that the headaches are cervicogenic Function: Ms. Kassing reports improvement in function for the cervical spine ROM: Ms. Mackert reports improvement in ROM for the cervical spine Interpretative annotation: Ongoing benefit. Therapeutic benefit observed. Effective therapeutic approach.          Interpretation: Results would suggest a successful diagnostic intervention. This confirms that the headaches and the neck pain are associated with facet joint syndrome.          Plan:  Set up procedure as a PRN palliative treatment option for this patient.               Did try chiropractic manipulations for her neck pain, but it made things worse.  At this point, the patient indicates that the neck pain is under control and she wants to move onto her low back pain.  Laboratory Chemistry  Inflammation Markers (CRP: Acute Phase) (ESR: Chronic Phase) Lab Results  Component Value Date   CRP  8 01/16/2018   ESRSEDRATE 16 01/16/2018                         Rheumatology Markers No results found.  Renal Markers Lab Results  Component Value Date   BUN 11 01/16/2018   CREATININE 1.22 (H)  01/16/2018   BCR 9 01/16/2018   GFRAA 58 (L) 01/16/2018   GFRNONAA 51 (L) 01/16/2018                             Hepatic Markers Lab Results  Component Value Date   AST 21 01/16/2018   ALT 24 11/26/2017   ALBUMIN 4.5 01/16/2018                        Neuropathy Markers Lab Results  Component Value Date   VITAMINB12 1,137 01/16/2018   HGBA1C 5.3 12/13/2017   HIV Non Reactive 12/16/2016                        Hematology Parameters Lab Results  Component Value Date   PLT 303 11/26/2017   HGB 14.6 11/26/2017   HCT 42.6 11/26/2017                        CV Markers No results found.  Note: Lab results reviewed.  Recent Imaging Results   Results for orders placed in visit on 04/02/18  DG C-Arm 1-60 Min-No Report   Narrative Fluoroscopy was utilized by the requesting physician.  No radiographic  interpretation.    Interpretation Report: Fluoroscopy was used during the procedure to assist with needle guidance. The images were interpreted intraoperatively by the requesting physician.  Meds   Current Outpatient Medications:  .  docusate sodium (COLACE) 100 MG capsule, Take 200 mg by mouth at bedtime. , Disp: , Rfl:  .  DULoxetine (CYMBALTA) 60 MG capsule, TAKE 1 CAPSULE (60 MG TOTAL) BY MOUTH DAILY., Disp: 30 capsule, Rfl: 11 .  lansoprazole (PREVACID) 30 MG capsule, TAKE 1 CAPSULE BY MOUTH TWICE DAILY, Disp: 60 capsule, Rfl: 2 .  LINZESS 290 MCG CAPS capsule, Take 1 capsule (290 mcg total) by mouth daily., Disp: 90 capsule, Rfl: 3 .  Magnesium Oxide 500 MG CAPS, Take 1 capsule (500 mg total) by mouth at bedtime as needed and may repeat dose one time if needed., Disp: 90 capsule, Rfl: 0 .  Melatonin 10 MG CAPS, Take by mouth., Disp: , Rfl:  .  meloxicam (MOBIC) 15 MG tablet, Take 1 tablet (15 mg total) by mouth daily., Disp: 30 tablet, Rfl: 3 .  methylPREDNISolone (MEDROL DOSEPAK) 4 MG TBPK tablet, 6 day dose pack - take as directed, Disp: 21 tablet, Rfl: 0 .  montelukast  (SINGULAIR) 10 MG tablet, Take 1 tablet (10 mg total) by mouth at bedtime., Disp: 90 tablet, Rfl: 3 .  Multiple Vitamin (MULTIVITAMIN WITH MINERALS) TABS tablet, Take 1 tablet by mouth daily., Disp: , Rfl:  .  NONFORMULARY OR COMPOUNDED ITEM, [facility]:  General Pain Cream Two - Ibuprofen 15%, Baclofen 1%, Gabapentin 3%, Lidocaine 2%, apply 1-2 grams to affected area 3-4 times daily., Disp: 120 each, Rfl: 3 .  phentermine 37.5 MG capsule, Take 37.5 mg by mouth every morning., Disp: , Rfl:  .  triamterene-hydrochlorothiazide (MAXZIDE-25) 37.5-25 MG tablet, Take 1 tablet by mouth daily., Disp: 90 tablet, Rfl:  0 .  vitamin C (ASCORBIC ACID) 500 MG tablet, Take 500 mg by mouth daily., Disp: , Rfl:  .  potassium chloride SA (K-DUR,KLOR-CON) 20 MEQ tablet, Take 1 tablet (20 mEq total) by mouth daily., Disp: 30 tablet, Rfl: 5  ROS  Constitutional: Denies any fever or chills Gastrointestinal: No reported hemesis, hematochezia, vomiting, or acute GI distress Musculoskeletal: Denies any acute onset joint swelling, redness, loss of ROM, or weakness Neurological: No reported episodes of acute onset apraxia, aphasia, dysarthria, agnosia, amnesia, paralysis, loss of coordination, or loss of consciousness  Allergies  Ms. Stratmann is allergic to ranitidine; amphetamine-dextroamphetamine; lactose intolerance (gi); reglan [metoclopramide]; topiramate er; wheat bran; and sulfa antibiotics.  [place]  Drug: Ms. Jimerson  reports that she does not use drugs. Alcohol:  reports that she drinks about 2.0 - 4.0 standard drinks of alcohol per week. Tobacco:  reports that she quit smoking about 30 years ago. Her smoking use included cigarettes. She has a 3.75 pack-year smoking history. She has never used smokeless tobacco. Medical:  has a past medical history of Fatty liver, Fibromyalgia, GERD (gastroesophageal reflux disease), Hemorrhoids, Hot flashes, Hypertension, IBS (irritable bowel syndrome), Migraines, Miscarriage,  Multilevel degenerative disc disease, and Vocal cord dysfunction. Surgical: Ms. Eblin  has a past surgical history that includes Elbow Debridement; Foot surgery; Cholecystectomy; Colonoscopy; Abdominal hysterectomy; and Esophagogastroduodenoscopy (egd) with propofol (N/A, 02/06/2018). Family: family history includes Alcohol abuse in her father and son; Arthritis in her son and son; Asthma in her sister; COPD in her father, paternal grandmother, and sister; Cancer in her paternal grandfather and sister; Colon cancer in her sister; Colon polyps in her sister; Depression in her son; Diabetes in her maternal grandmother, mother, and son; Drug abuse in her son; Glaucoma in her mother; Hyperlipidemia in her mother; Hypertension in her mother; Kidney disease in her sister; Mental illness in her sister and son; Miscarriages / Stillbirths in her sister and sister; Uterine cancer in her sister.  Constitutional Exam  General appearance: Well nourished, well developed, and well hydrated. In no apparent acute distress Vitals:   04/17/18 0824  BP: 132/86  Pulse: 65  Temp: 98.5 F (36.9 C)  SpO2: 100%  Weight: 155 lb (70.3 kg)  Height: 5' 2" (1.575 m)   BMI Assessment: Estimated body mass index is 28.35 kg/m as calculated from the following:   Height as of this encounter: 5' 2" (1.575 m).   Weight as of this encounter: 155 lb (70.3 kg).  BMI interpretation table: BMI level Category Range association with higher incidence of chronic pain  <18 kg/m2 Underweight   18.5-24.9 kg/m2 Ideal body weight   25-29.9 kg/m2 Overweight Increased incidence by 20%  30-34.9 kg/m2 Obese (Class I) Increased incidence by 68%  35-39.9 kg/m2 Severe obesity (Class II) Increased incidence by 136%  >40 kg/m2 Extreme obesity (Class III) Increased incidence by 254%   Patient's current BMI Ideal Body weight  Body mass index is 28.35 kg/m. Ideal body weight: 50.1 kg (110 lb 7.2 oz) Adjusted ideal body weight: 58.2 kg (128 lb 4.3  oz)   BMI Readings from Last 4 Encounters:  04/17/18 28.35 kg/m  04/02/18 29.26 kg/m  03/20/18 29.26 kg/m  02/20/18 29.15 kg/m   Wt Readings from Last 4 Encounters:  04/17/18 155 lb (70.3 kg)  04/02/18 160 lb (72.6 kg)  03/20/18 160 lb (72.6 kg)  02/20/18 159 lb 6.4 oz (72.3 kg)  Psych/Mental status: Alert, oriented x 3 (person, place, & time)  Eyes: PERLA Respiratory: No evidence of acute respiratory distress  Cervical Spine Area Exam  Skin & Axial Inspection: No masses, redness, edema, swelling, or associated skin lesions Alignment: Symmetrical Functional ROM: Improved after treatment      Stability: No instability detected Muscle Tone/Strength: Functionally intact. No obvious neuro-muscular anomalies detected. Sensory (Neurological): Unimpaired Palpation: No palpable anomalies              Upper Extremity (UE) Exam    Side: Right upper extremity  Side: Left upper extremity  Skin & Extremity Inspection: Skin color, temperature, and hair growth are WNL. No peripheral edema or cyanosis. No masses, redness, swelling, asymmetry, or associated skin lesions. No contractures.  Skin & Extremity Inspection: Skin color, temperature, and hair growth are WNL. No peripheral edema or cyanosis. No masses, redness, swelling, asymmetry, or associated skin lesions. No contractures.  Functional ROM: Unrestricted ROM          Functional ROM: Unrestricted ROM          Muscle Tone/Strength: Functionally intact. No obvious neuro-muscular anomalies detected.  Muscle Tone/Strength: Functionally intact. No obvious neuro-muscular anomalies detected.  Sensory (Neurological): Unimpaired          Sensory (Neurological): Unimpaired          Palpation: No palpable anomalies              Palpation: No palpable anomalies              Provocative Test(s):  Phalen's test: deferred Tinel's test: deferred Apley's scratch test (touch opposite shoulder):  Action 1 (Across chest): deferred Action 2  (Overhead): deferred Action 3 (LB reach): deferred   Provocative Test(s):  Phalen's test: deferred Tinel's test: deferred Apley's scratch test (touch opposite shoulder):  Action 1 (Across chest): deferred Action 2 (Overhead): deferred Action 3 (LB reach): deferred    Thoracic Spine Area Exam  Skin & Axial Inspection: No masses, redness, or swelling Alignment: Symmetrical Functional ROM: Unrestricted ROM Stability: No instability detected Muscle Tone/Strength: Functionally intact. No obvious neuro-muscular anomalies detected. Sensory (Neurological): Unimpaired Muscle strength & Tone: No palpable anomalies  Lumbar Spine Area Exam  Skin & Axial Inspection: No masses, redness, or swelling Alignment: Symmetrical Functional ROM: Decreased ROM affecting both sides Stability: No instability detected Muscle Tone/Strength: Increased muscle tone over affected area Sensory (Neurological): Movement-associated pain Palpation: Complains of area being tender to palpation       Provocative Tests: Hyperextension/rotation test: (+) bilaterally for facet joint pain. Lumbar quadrant test (Kemp's test): (+) bilaterally for facet joint pain. Lateral bending test: deferred today       Patrick's Maneuver: deferred today                   FABER test: deferred today                   S-I anterior distraction/compression test: deferred today         S-I lateral compression test: deferred today         S-I Thigh-thrust test: deferred today         S-I Gaenslen's test: deferred today          Gait & Posture Assessment  Ambulation: Unassisted Gait: Relatively normal for age and body habitus Posture: Antalgic   Lower Extremity Exam    Side: Right lower extremity  Side: Left lower extremity  Stability: No instability observed          Stability: No instability observed  Skin & Extremity Inspection: Skin color, temperature, and hair growth are WNL. No peripheral edema or cyanosis. No masses,  redness, swelling, asymmetry, or associated skin lesions. No contractures.  Skin & Extremity Inspection: Skin color, temperature, and hair growth are WNL. No peripheral edema or cyanosis. No masses, redness, swelling, asymmetry, or associated skin lesions. No contractures.  Functional ROM: Unrestricted ROM                  Functional ROM: Unrestricted ROM                  Muscle Tone/Strength: Functionally intact. No obvious neuro-muscular anomalies detected.  Muscle Tone/Strength: Functionally intact. No obvious neuro-muscular anomalies detected.  Sensory (Neurological): Referred pain pattern  Sensory (Neurological): Referred pain pattern  Palpation: No palpable anomalies  Palpation: No palpable anomalies   Assessment  Primary Diagnosis & Pertinent Problem List: The primary encounter diagnosis was Chronic neck pain (Tertiary Area of Pain) (midline). Diagnoses of Cervical facet syndrome (Bilateral), Chronic low back pain (Primary Area of Pain) (Bilateral) (L>R) w/ sciatica (Bilateral), Chronic lower extremity pain (Secondary Area of Pain) (Bilateral) (L>R), Cervicogenic headache (Bilateral), Spondylosis without myelopathy or radiculopathy, cervical region, DDD (degenerative disc disease), cervical, DDD (degenerative disc disease), lumbar, Lumbar facet syndrome (Bilateral) (R>L), and Spondylosis without myelopathy or radiculopathy, lumbar region were also pertinent to this visit.  Status Diagnosis  Improved Improved Unimproved 1. Chronic neck pain (Tertiary Area of Pain) (midline)   2. Cervical facet syndrome (Bilateral)   3. Chronic low back pain (Primary Area of Pain) (Bilateral) (L>R) w/ sciatica (Bilateral)   4. Chronic lower extremity pain (Secondary Area of Pain) (Bilateral) (L>R)   5. Cervicogenic headache (Bilateral)   6. Spondylosis without myelopathy or radiculopathy, cervical region   7. DDD (degenerative disc disease), cervical   8. DDD (degenerative disc disease), lumbar   9.  Lumbar facet syndrome (Bilateral) (R>L)   10. Spondylosis without myelopathy or radiculopathy, lumbar region     Problems updated and reviewed during this visit: Problem  Chronic lower extremity pain (Secondary Area of Pain) (Bilateral) (L>R)   The patient pain pattern follows that of "referred pain" from the lumbar facets.    Plan of Care  Pharmacotherapy (Medications Ordered): No orders of the defined types were placed in this encounter.  Medications administered today: Aleaha L. Gorton had no medications administered during this visit.   Procedure Orders     CERVICAL FACET (MEDIAL BRANCH NERVE BLOCK)      LUMBAR FACET(MEDIAL BRANCH NERVE BLOCK) [facility] Lab Orders  No laboratory test(s) ordered today   Imaging Orders  No imaging studies ordered today   Referral Orders  No referral(s) requested today   Interventional management options: Planned, scheduled, and/or pending:   Diagnostic bilateral lumbar facet block #1 under fluoroscopic guidance and IV sedation    Considering:   Diagnostic bilateral cervical facet block  Possible bilateral cervical facet RFA  Diagnostic bilateral lumbar facet block  Possible bilateral lumbar facet RFA  Diagnostic right-sided sacroiliac joint block  Possible right-sided sacroiliac joint RFA  Diagnostic bilateral intra-articular hip joint injection with local anesthetic and steroid  Diagnostic bilateral femoral nerve block + obturator nerve block  Possible bilateral femoral nerve + obturator nerve RFA  Diagnostic bilateral L5 transforaminal ESI  Diagnostic bilateral intra-articular knee joint injection with local anesthetic and steroid  Therapeutic series of 5 intra-articular bilateral Hyalgan knee injections  Diagnostic bilateral genicular nerve block  Possible bilateral genicular nerve RFA    Palliative  PRN treatment(s):   Diagnostic bilateral cervical facet block #2 under fluoroscopic guidance and IV sedation   Provider-requested  follow-up: Return for Procedure (w/ sedation): (B) L-FCT BLK #1.  Future Appointments  Date Time Provider [place]  04/19/2018  2:00 PM [facility]  05/06/2018  8:00 AM [facility]  05/09/2018  8:00 AM Milinda Pointer, MD [facility] None  08/21/2018  8:40 AM Bacigalupo, [name], MD [facility] None   Primary Care Physician: Virginia Crews, MD Location: [facility] Note by: Gaspar Cola, MD Date: 04/17/2018; Time: 9:54 AM

## 2018-04-17 ENCOUNTER — Other Ambulatory Visit: Payer: Self-pay

## 2018-04-17 ENCOUNTER — Encounter: Payer: Self-pay | Admitting: Pain Medicine

## 2018-04-17 ENCOUNTER — Ambulatory Visit: Payer: 59 | Attending: Pain Medicine | Admitting: Pain Medicine

## 2018-04-17 VITALS — BP 132/86 | HR 65 | Temp 98.5°F | Ht 62.0 in | Wt 155.0 lb

## 2018-04-17 DIAGNOSIS — G894 Chronic pain syndrome: Secondary | ICD-10-CM | POA: Diagnosis not present

## 2018-04-17 DIAGNOSIS — M5136 Other intervertebral disc degeneration, lumbar region: Secondary | ICD-10-CM | POA: Insufficient documentation

## 2018-04-17 DIAGNOSIS — M533 Sacrococcygeal disorders, not elsewhere classified: Secondary | ICD-10-CM | POA: Diagnosis not present

## 2018-04-17 DIAGNOSIS — J383 Other diseases of vocal cords: Secondary | ICD-10-CM | POA: Insufficient documentation

## 2018-04-17 DIAGNOSIS — M5441 Lumbago with sciatica, right side: Secondary | ICD-10-CM | POA: Diagnosis not present

## 2018-04-17 DIAGNOSIS — Z87891 Personal history of nicotine dependence: Secondary | ICD-10-CM | POA: Insufficient documentation

## 2018-04-17 DIAGNOSIS — M47812 Spondylosis without myelopathy or radiculopathy, cervical region: Secondary | ICD-10-CM | POA: Diagnosis not present

## 2018-04-17 DIAGNOSIS — Z800 Family history of malignant neoplasm of digestive organs: Secondary | ICD-10-CM | POA: Diagnosis not present

## 2018-04-17 DIAGNOSIS — F419 Anxiety disorder, unspecified: Secondary | ICD-10-CM | POA: Insufficient documentation

## 2018-04-17 DIAGNOSIS — M5030 Other cervical disc degeneration, unspecified cervical region: Secondary | ICD-10-CM | POA: Diagnosis not present

## 2018-04-17 DIAGNOSIS — M5442 Lumbago with sciatica, left side: Secondary | ICD-10-CM | POA: Diagnosis not present

## 2018-04-17 DIAGNOSIS — G43909 Migraine, unspecified, not intractable, without status migrainosus: Secondary | ICD-10-CM | POA: Diagnosis not present

## 2018-04-17 DIAGNOSIS — Z8249 Family history of ischemic heart disease and other diseases of the circulatory system: Secondary | ICD-10-CM | POA: Diagnosis not present

## 2018-04-17 DIAGNOSIS — Z79899 Other long term (current) drug therapy: Secondary | ICD-10-CM | POA: Diagnosis not present

## 2018-04-17 DIAGNOSIS — K589 Irritable bowel syndrome without diarrhea: Secondary | ICD-10-CM | POA: Diagnosis not present

## 2018-04-17 DIAGNOSIS — K219 Gastro-esophageal reflux disease without esophagitis: Secondary | ICD-10-CM | POA: Diagnosis not present

## 2018-04-17 DIAGNOSIS — M25561 Pain in right knee: Secondary | ICD-10-CM | POA: Insufficient documentation

## 2018-04-17 DIAGNOSIS — R51 Headache: Secondary | ICD-10-CM

## 2018-04-17 DIAGNOSIS — Z6828 Body mass index (BMI) 28.0-28.9, adult: Secondary | ICD-10-CM | POA: Insufficient documentation

## 2018-04-17 DIAGNOSIS — E663 Overweight: Secondary | ICD-10-CM | POA: Diagnosis not present

## 2018-04-17 DIAGNOSIS — G8929 Other chronic pain: Secondary | ICD-10-CM | POA: Diagnosis not present

## 2018-04-17 DIAGNOSIS — I129 Hypertensive chronic kidney disease with stage 1 through stage 4 chronic kidney disease, or unspecified chronic kidney disease: Secondary | ICD-10-CM | POA: Diagnosis not present

## 2018-04-17 DIAGNOSIS — M79604 Pain in right leg: Secondary | ICD-10-CM | POA: Diagnosis not present

## 2018-04-17 DIAGNOSIS — M51369 Other intervertebral disc degeneration, lumbar region without mention of lumbar back pain or lower extremity pain: Secondary | ICD-10-CM

## 2018-04-17 DIAGNOSIS — M47816 Spondylosis without myelopathy or radiculopathy, lumbar region: Secondary | ICD-10-CM | POA: Diagnosis not present

## 2018-04-17 DIAGNOSIS — M4802 Spinal stenosis, cervical region: Secondary | ICD-10-CM | POA: Insufficient documentation

## 2018-04-17 DIAGNOSIS — M542 Cervicalgia: Secondary | ICD-10-CM

## 2018-04-17 DIAGNOSIS — N183 Chronic kidney disease, stage 3 (moderate): Secondary | ICD-10-CM | POA: Insufficient documentation

## 2018-04-17 DIAGNOSIS — M797 Fibromyalgia: Secondary | ICD-10-CM | POA: Diagnosis not present

## 2018-04-17 DIAGNOSIS — M4312 Spondylolisthesis, cervical region: Secondary | ICD-10-CM | POA: Insufficient documentation

## 2018-04-17 DIAGNOSIS — J309 Allergic rhinitis, unspecified: Secondary | ICD-10-CM | POA: Insufficient documentation

## 2018-04-17 DIAGNOSIS — M79605 Pain in left leg: Secondary | ICD-10-CM

## 2018-04-17 DIAGNOSIS — G4486 Cervicogenic headache: Secondary | ICD-10-CM

## 2018-04-17 NOTE — Patient Instructions (Signed)
____________________________________________________________________________________________  Preparing for Procedure with Sedation  Instructions: . Oral Intake: Do not eat or drink anything for at least 8 hours prior to your procedure. . Transportation: Public transportation is not allowed. Bring an adult driver. The driver must be physically present in our waiting room before any procedure can be started. . Physical Assistance: Bring an adult physically capable of assisting you, in the event you need help. This adult should keep you company at home for at least 6 hours after the procedure. . Blood Pressure Medicine: Take your blood pressure medicine with a sip of water the morning of the procedure. . Blood thinners: Notify our staff if you are taking any blood thinners. Depending on which one you take, there will be specific instructions on how and when to stop it. . Diabetics on insulin: Notify the staff so that you can be scheduled 1st case in the morning. If your diabetes requires high dose insulin, take only  of your normal insulin dose the morning of the procedure and notify the staff that you have done so. . Preventing infections: Shower with an antibacterial soap the morning of your procedure. . Build-up your immune system: Take 1000 mg of Vitamin C with every meal (3 times a day) the day prior to your procedure. . Antibiotics: Inform the staff if you have a condition or reason that requires you to take antibiotics before dental procedures. . Pregnancy: If you are pregnant, call and cancel the procedure. . Sickness: If you have a cold, fever, or any active infections, call and cancel the procedure. . Arrival: You must be in the facility at least 30 minutes prior to your scheduled procedure. . Children: Do not bring children with you. . Dress appropriately: Bring dark clothing that you would not mind if they get stained. . Valuables: Do not bring any jewelry or valuables.  Procedure  appointments are reserved for interventional treatments only. . No Prescription Refills. . No medication changes will be discussed during procedure appointments. . No disability issues will be discussed.  Reasons to call and reschedule or cancel your procedure: (Following these recommendations will minimize the risk of a serious complication.) . Surgeries: Avoid having procedures within 2 weeks of any surgery. (Avoid for 2 weeks before or after any surgery). . Flu Shots: Avoid having procedures within 2 weeks of a flu shots or . (Avoid for 2 weeks before or after immunizations). . Barium: Avoid having a procedure within 7-10 days after having had a radiological study involving the use of radiological contrast. (Myelograms, Barium swallow or enema study). . Heart attacks: Avoid any elective procedures or surgeries for the initial 6 months after a "Myocardial Infarction" (Heart Attack). . Blood thinners: It is imperative that you stop these medications before procedures. Let us know if you if you take any blood thinner.  . Infection: Avoid procedures during or within two weeks of an infection (including chest colds or gastrointestinal problems). Symptoms associated with infections include: Localized redness, fever, chills, night sweats or profuse sweating, burning sensation when voiding, cough, congestion, stuffiness, runny nose, sore throat, diarrhea, nausea, vomiting, cold or Flu symptoms, recent or current infections. It is specially important if the infection is over the area that we intend to treat. . Heart and lung problems: Symptoms that may suggest an active cardiopulmonary problem include: cough, chest pain, breathing difficulties or shortness of breath, dizziness, ankle swelling, uncontrolled high or unusually low blood pressure, and/or palpitations. If you are experiencing any of these symptoms, cancel   your procedure and contact your primary care physician for an evaluation.  Remember:   Regular Business hours are:  Monday to Thursday 8:00 AM to 4:00 PM  Provider's Schedule: Elyas Villamor, MD:  Procedure days: Tuesday and Thursday 7:30 AM to 4:00 PM  Bilal Lateef, MD:  Procedure days: Monday and Wednesday 7:30 AM to 4:00 PM ____________________________________________________________________________________________    

## 2018-04-19 ENCOUNTER — Ambulatory Visit: Payer: 59

## 2018-04-21 ENCOUNTER — Emergency Department
Admission: EM | Admit: 2018-04-21 | Discharge: 2018-04-21 | Disposition: A | Discharge status: Home / Self Care | Payer: 59 | Attending: Nephrology | Admitting: Nephrology

## 2018-04-21 ENCOUNTER — Emergency Department
Admission: EM | Admit: 2018-04-21 | Discharge: 2018-04-22 | Discharge status: Absent without leave | Payer: 59 | Attending: Nephrology | Admitting: Nephrology

## 2018-04-21 DIAGNOSIS — N183 Chronic kidney disease, stage 3 unspecified: Secondary | ICD-10-CM

## 2018-04-22 ENCOUNTER — Other Ambulatory Visit: Payer: Self-pay | Admitting: Family Medicine

## 2018-04-22 ENCOUNTER — Other Ambulatory Visit: Payer: Self-pay | Admitting: Gastroenterology

## 2018-05-06 ENCOUNTER — Ambulatory Visit
Admission: RE | Admit: 2018-05-06 | Discharge: 2018-05-06 | Disposition: A | Discharge status: Home / Self Care | Payer: 59 | Source: Ambulatory Visit | Attending: Family Medicine | Admitting: Family Medicine

## 2018-05-06 DIAGNOSIS — Z1231 Encounter for screening mammogram for malignant neoplasm of breast: Secondary | ICD-10-CM | POA: Diagnosis not present

## 2018-05-09 ENCOUNTER — Ambulatory Visit (HOSPITAL_BASED_OUTPATIENT_CLINIC_OR_DEPARTMENT_OTHER): Payer: 59 | Admitting: Pain Medicine

## 2018-05-09 ENCOUNTER — Ambulatory Visit
Admission: RE | Admit: 2018-05-09 | Discharge: 2018-05-09 | Disposition: A | Discharge status: Home / Self Care | Payer: 59 | Source: Ambulatory Visit | Attending: Pain Medicine | Admitting: Pain Medicine

## 2018-05-09 ENCOUNTER — Other Ambulatory Visit: Payer: Self-pay

## 2018-05-09 ENCOUNTER — Encounter: Payer: Self-pay | Admitting: Pain Medicine

## 2018-05-09 VITALS — BP 148/82 | HR 64 | Temp 98.0°F | Resp 17 | Ht 62.0 in | Wt 154.0 lb

## 2018-05-09 DIAGNOSIS — F419 Anxiety disorder, unspecified: Secondary | ICD-10-CM | POA: Insufficient documentation

## 2018-05-09 DIAGNOSIS — M5441 Lumbago with sciatica, right side: Secondary | ICD-10-CM | POA: Insufficient documentation

## 2018-05-09 DIAGNOSIS — M47816 Spondylosis without myelopathy or radiculopathy, lumbar region: Secondary | ICD-10-CM | POA: Diagnosis not present

## 2018-05-09 DIAGNOSIS — M5442 Lumbago with sciatica, left side: Secondary | ICD-10-CM

## 2018-05-09 DIAGNOSIS — Z888 Allergy status to other drugs, medicaments and biological substances status: Secondary | ICD-10-CM | POA: Insufficient documentation

## 2018-05-09 DIAGNOSIS — Z882 Allergy status to sulfonamides status: Secondary | ICD-10-CM | POA: Insufficient documentation

## 2018-05-09 DIAGNOSIS — M5136 Other intervertebral disc degeneration, lumbar region: Secondary | ICD-10-CM | POA: Insufficient documentation

## 2018-05-09 DIAGNOSIS — Z9049 Acquired absence of other specified parts of digestive tract: Secondary | ICD-10-CM | POA: Insufficient documentation

## 2018-05-09 DIAGNOSIS — G8929 Other chronic pain: Secondary | ICD-10-CM | POA: Insufficient documentation

## 2018-05-09 MED ORDER — FENTANYL CITRATE (PF) 100 MCG/2ML IJ SOLN
25.0000 ug | INTRAMUSCULAR | Status: DC | PRN
  Administered 2018-05-09: 50 ug via INTRAVENOUS

## 2018-05-09 MED ORDER — MIDAZOLAM HCL 5 MG/5ML IJ SOLN
INTRAMUSCULAR | Status: AC
  Filled 2018-05-09: qty 5

## 2018-05-09 MED ORDER — LIDOCAINE HCL 2 % IJ SOLN
INTRAMUSCULAR | Status: AC
  Filled 2018-05-09: qty 20

## 2018-05-09 MED ORDER — ROPIVACAINE HCL 2 MG/ML IJ SOLN
INTRAMUSCULAR | Status: AC
  Filled 2018-05-09: qty 20

## 2018-05-09 MED ORDER — FENTANYL CITRATE (PF) 100 MCG/2ML IJ SOLN
INTRAMUSCULAR | Status: AC
  Filled 2018-05-09: qty 2

## 2018-05-09 MED ORDER — LACTATED RINGERS IV SOLN
1000.0000 mL | Freq: Once | INTRAVENOUS | Status: AC
  Administered 2018-05-09: 1000 mL via INTRAVENOUS

## 2018-05-09 MED ORDER — MIDAZOLAM HCL 5 MG/5ML IJ SOLN
1.0000 mg | INTRAMUSCULAR | Status: DC | PRN
  Administered 2018-05-09: 3 mg via INTRAVENOUS

## 2018-05-09 MED ORDER — ROPIVACAINE HCL 2 MG/ML IJ SOLN
18.0000 mL | Freq: Once | INTRAMUSCULAR | Status: AC
  Administered 2018-05-09: 18 mL via PERINEURAL

## 2018-05-09 MED ORDER — TRIAMCINOLONE ACETONIDE 40 MG/ML IJ SUSP
80.0000 mg | Freq: Once | INTRAMUSCULAR | Status: AC
  Administered 2018-05-09: 80 mg

## 2018-05-09 MED ORDER — TRIAMCINOLONE ACETONIDE 40 MG/ML IJ SUSP
INTRAMUSCULAR | Status: AC
  Filled 2018-05-09: qty 2

## 2018-05-09 MED ORDER — LIDOCAINE HCL 2 % IJ SOLN
20.0000 mL | Freq: Once | INTRAMUSCULAR | Status: AC
  Administered 2018-05-09: 400 mg

## 2018-05-09 NOTE — Progress Notes (Signed)
Patient's Name: Elizabeth Bowers  MRN: [id number]  Referring Provider: Virginia Crews, MD  DOB: Apr 29, 1966  PCP: Virginia Crews, MD  DOS: 05/09/2018  Note by: Gaspar Cola, MD  Service setting: Ambulatory outpatient  Specialty: Interventional Pain Management  Patient type: Established  Location: [facility] (AMB) Pain Management Facility  Visit type: Interventional Procedure   Primary Reason for Visit: Interventional Pain Management Treatment. CC: Back Pain (lower)  Procedure:          Anesthesia, Analgesia, Anxiolysis:  Type: Lumbar Facet, Medial Branch Block(s) #1  Primary Purpose: Diagnostic Region: Posterolateral Lumbosacral Spine Level: L2, L3, L4, L5, & S1 Medial Branch Level(s). Injecting these levels blocks the L3-4, L4-5, and L5-S1 lumbar facet joints. Laterality: Bilateral  Type: Moderate (Conscious) Sedation combined with Local Anesthesia Indication(s): Analgesia and Anxiety Route: Intravenous (IV) IV Access: Secured Sedation: Meaningful verbal contact was maintained at all times during the procedure  Local Anesthetic: Lidocaine 1-2%  Position: Prone   Indications: 1. Spondylosis without myelopathy or radiculopathy, lumbar region   2. Lumbar facet syndrome (Bilateral) (R>L)   3. DDD (degenerative disc disease), lumbar   4. Chronic low back pain (Primary Area of Pain) (Bilateral) (L>R) w/ sciatica (Bilateral)    Pain Score: Pre-procedure: 3 /10 Post-procedure: 0-No pain/10  Pre-op Assessment:  Elizabeth Bowers is a 52 y.o. (year old), female patient, seen today for interventional treatment. She  has a past surgical history that includes Elbow Debridement; Foot surgery; Cholecystectomy; Colonoscopy; Abdominal hysterectomy; and Esophagogastroduodenoscopy (egd) with propofol (N/A, 02/06/2018). Elizabeth Bowers has a current medication list which includes the following prescription(s): docusate sodium, duloxetine, lansoprazole, linzess, magnesium oxide, melatonin, meloxicam,  multivitamin with minerals, NONFORMULARY OR COMPOUNDED ITEM, phentermine, triamterene-hydrochlorothiazide, vitamin c, methylprednisolone, montelukast, and potassium chloride sa, and the following Facility-Administered Medications: fentanyl and midazolam. Her primarily concern today is the Back Pain (lower)  Initial Vital Signs:  Pulse/HCG Rate: 65ECG Heart Rate: (!) 55 Temp: 98.7 F (37.1 C) Resp: 16 BP: 135/85 SpO2: 100 %  BMI: Estimated body mass index is 28.17 kg/m as calculated from the following:   Height as of this encounter: 5\' 2"  (1.575 m).   Weight as of this encounter: 154 lb (69.9 kg).  Risk Assessment: Allergies: Reviewed. She is allergic to ranitidine; amphetamine-dextroamphetamine; lactose intolerance (gi); reglan [metoclopramide]; topiramate er; wheat bran; and sulfa antibiotics.  Allergy Precautions: None required Coagulopathies: Reviewed. None identified.  Blood-thinner therapy: None at this time Active Infection(s): Reviewed. None identified. Elizabeth Bowers is afebrile  Site Confirmation: Elizabeth Bowers was asked to confirm the procedure and laterality before marking the site Procedure checklist: Completed Consent: Before the procedure and under the influence of no sedative(s), amnesic(s), or anxiolytics, the patient was informed of the treatment options, risks and possible complications. To fulfill our ethical and legal obligations, as recommended by the American Medical Association's Code of Ethics, I have informed the patient of my clinical impression; the nature and purpose of the treatment or procedure; the risks, benefits, and possible complications of the intervention; the alternatives, including doing nothing; the risk(s) and benefit(s) of the alternative treatment(s) or procedure(s); and the risk(s) and benefit(s) of doing nothing. The patient was provided information about the general risks and possible complications associated with the procedure. These may include, but are  not limited to: failure to achieve desired goals, infection, bleeding, organ or nerve damage, allergic reactions, paralysis, and death. In addition, the patient was informed of those risks and complications associated to Spine-related procedures, such as failure  to decrease pain; infection (i.e.: Meningitis, epidural or intraspinal abscess); bleeding (i.e.: epidural hematoma, subarachnoid hemorrhage, or any other type of intraspinal or peri-dural bleeding); organ or nerve damage (i.e.: Any type of peripheral nerve, nerve root, or spinal cord injury) with subsequent damage to sensory, motor, and/or autonomic systems, resulting in permanent pain, numbness, and/or weakness of one or several areas of the body; allergic reactions; (i.e.: anaphylactic reaction); and/or death. Furthermore, the patient was informed of those risks and complications associated with the medications. These include, but are not limited to: allergic reactions (i.e.: anaphylactic or anaphylactoid reaction(s)); adrenal axis suppression; blood sugar elevation that in diabetics may result in ketoacidosis or comma; water retention that in patients with history of congestive heart failure may result in shortness of breath, pulmonary edema, and decompensation with resultant heart failure; weight gain; swelling or edema; medication-induced neural toxicity; particulate matter embolism and blood vessel occlusion with resultant organ, and/or nervous system infarction; and/or aseptic necrosis of one or more joints. Finally, the patient was informed that Medicine is not an exact science; therefore, there is also the possibility of unforeseen or unpredictable risks and/or possible complications that may result in a catastrophic outcome. The patient indicated having understood very clearly. We have given the patient no guarantees and we have made no promises. Enough time was given to the patient to ask questions, all of which were answered to the patient's  satisfaction. Elizabeth Bowers has indicated that she wanted to continue with the procedure. Attestation: I, the ordering provider, attest that I have discussed with the patient the benefits, risks, side-effects, alternatives, likelihood of achieving goals, and potential problems during recovery for the procedure that I have provided informed consent. Date  Time: 05/09/2018  8:09 AM  Pre-Procedure Preparation:  Monitoring: As per clinic protocol. Respiration, ETCO2, SpO2, BP, heart rate and rhythm monitor placed and checked for adequate function Safety Precautions: Patient was assessed for positional comfort and pressure points before starting the procedure. Time-out: I initiated and conducted the "Time-out" before starting the procedure, as per protocol. The patient was asked to participate by confirming the accuracy of the "Time Out" information. Verification of the correct person, site, and procedure were performed and confirmed by me, the nursing staff, and the patient. "Time-out" conducted as per Joint Commission's Universal Protocol (UP.01.01.01). Time: 9476  Description of Procedure:          Laterality: Bilateral. The procedure was performed in identical fashion on both sides. Levels:  L2, L3, L4, L5, & S1 Medial Branch Level(s) Area Prepped: Posterior Lumbosacral Region Prepping solution: ChloraPrep (2% chlorhexidine gluconate and 70% isopropyl alcohol) Safety Precautions: Aspiration looking for blood return was conducted prior to all injections. At no point did we inject any substances, as a needle was being advanced. Before injecting, the patient was told to immediately notify me if she was experiencing any new onset of "ringing in the ears, or metallic taste in the mouth". No attempts were made at seeking any paresthesias. Safe injection practices and needle disposal techniques used. Medications properly checked for expiration dates. SDV (single dose vial) medications used. After the completion of  the procedure, all disposable equipment used was discarded in the proper designated medical waste containers. Local Anesthesia: Protocol guidelines were followed. The patient was positioned over the fluoroscopy table. The area was prepped in the usual manner. The time-out was completed. The target area was identified using fluoroscopy. A 12-in long, straight, sterile hemostat was used with fluoroscopic guidance to locate the targets for  each level blocked. Once located, the skin was marked with an approved surgical skin marker. Once all sites were marked, the skin (epidermis, dermis, and hypodermis), as well as deeper tissues (fat, connective tissue and muscle) were infiltrated with a small amount of a short-acting local anesthetic, loaded on a 10cc syringe with a 25G, 1.5-in  Needle. An appropriate amount of time was allowed for local anesthetics to take effect before proceeding to the next step. Local Anesthetic: Lidocaine 2.0% The unused portion of the local anesthetic was discarded in the proper designated containers. Technical explanation of process:  L2 Medial Branch Nerve Block (MBB): The target area for the L2 medial branch is at the junction of the postero-lateral aspect of the superior articular process and the superior, posterior, and medial edge of the transverse process of L3. Under fluoroscopic guidance, a Quincke needle was inserted until contact was made with os over the superior postero-lateral aspect of the pedicular shadow (target area). After negative aspiration for blood, 0.5 mL of the nerve block solution was injected without difficulty or complication. The needle was removed intact. L3 Medial Branch Nerve Block (MBB): The target area for the L3 medial branch is at the junction of the postero-lateral aspect of the superior articular process and the superior, posterior, and medial edge of the transverse process of L4. Under fluoroscopic guidance, a Quincke needle was inserted until contact  was made with os over the superior postero-lateral aspect of the pedicular shadow (target area). After negative aspiration for blood, 0.5 mL of the nerve block solution was injected without difficulty or complication. The needle was removed intact. L4 Medial Branch Nerve Block (MBB): The target area for the L4 medial branch is at the junction of the postero-lateral aspect of the superior articular process and the superior, posterior, and medial edge of the transverse process of L5. Under fluoroscopic guidance, a Quincke needle was inserted until contact was made with os over the superior postero-lateral aspect of the pedicular shadow (target area). After negative aspiration for blood, 0.5 mL of the nerve block solution was injected without difficulty or complication. The needle was removed intact. L5 Medial Branch Nerve Block (MBB): The target area for the L5 medial branch is at the junction of the postero-lateral aspect of the superior articular process and the superior, posterior, and medial edge of the sacral ala. Under fluoroscopic guidance, a Quincke needle was inserted until contact was made with os over the superior postero-lateral aspect of the pedicular shadow (target area). After negative aspiration for blood, 0.5 mL of the nerve block solution was injected without difficulty or complication. The needle was removed intact. S1 Medial Branch Nerve Block (MBB): The target area for the S1 medial branch is at the posterior and inferior 6 o'clock position of the L5-S1 facet joint. Under fluoroscopic guidance, the Quincke needle inserted for the L5 MBB was redirected until contact was made with os over the inferior and postero aspect of the sacrum, at the 6 o' clock position under the L5-S1 facet joint (Target area). After negative aspiration for blood, 0.5 mL of the nerve block solution was injected without difficulty or complication. The needle was removed intact. Procedural Needles: 22-gauge, 3.5-inch,  Quincke needles used for all levels. Nerve block solution: 0.2% PF-Ropivacaine + Triamcinolone (40 mg/mL) diluted to a final concentration of 4 mg of Triamcinolone/mL of Ropivacaine The unused portion of the solution was discarded in the proper designated containers.  Once the entire procedure was completed, the treated area was cleaned,  making sure to leave some of the prepping solution back to take advantage of its long term bactericidal properties.   Illustration of the posterior view of the lumbar spine and the posterior neural structures. Laminae of L2 through S1 are labeled. DPRL5, dorsal primary ramus of L5; DPRS1, dorsal primary ramus of S1; DPR3, dorsal primary ramus of L3; FJ, facet (zygapophyseal) joint L3-L4; I, inferior articular process of L4; LB1, lateral branch of dorsal primary ramus of L1; IAB, inferior articular branches from L3 medial branch (supplies L4-L5 facet joint); IBP, intermediate branch plexus; MB3, medial branch of dorsal primary ramus of L3; NR3, third lumbar nerve root; S, superior articular process of L5; SAB, superior articular branches from L4 (supplies L4-5 facet joint also); TP3, transverse process of L3.  Vitals:   05/09/18 0910 05/09/18 0916 05/09/18 0926 05/09/18 0936  BP: (!) 152/84 (!) 155/94 (!) 151/90 (!) 148/82  Pulse: 64     Resp: 12 15 16 17   Temp:  98.2 F (36.8 C)  98 F (36.7 C)  TempSrc:  Temporal  Temporal  SpO2: 100% 100% 100% 100%  Weight:      Height:         Start Time: 0856 hrs. End Time: 0909 hrs.  Imaging Guidance (Spinal):          Type of Imaging Technique: Fluoroscopy Guidance (Spinal) Indication(s): Assistance in needle guidance and placement for procedures requiring needle placement in or near specific anatomical locations not easily accessible without such assistance. Exposure Time: Please see nurses notes. Contrast: None used. Fluoroscopic Guidance: I was personally present during the use of fluoroscopy. "Tunnel Vision  Technique" used to obtain the best possible view of the target area. Parallax error corrected before commencing the procedure. "Direction-depth-direction" technique used to introduce the needle under continuous pulsed fluoroscopy. Once target was reached, antero-posterior, oblique, and lateral fluoroscopic projection used confirm needle placement in all planes. Images permanently stored in EMR. Interpretation: No contrast injected. I personally interpreted the imaging intraoperatively. Adequate needle placement confirmed in multiple planes. Permanent images saved into the patient's record.  Antibiotic Prophylaxis:   Anti-infectives (From admission, onward)   None     Indication(s): None identified  Post-operative Assessment:  Post-procedure Vital Signs:  Pulse/HCG Rate: 6467 Temp: 98 F (36.7 C) Resp: 17 BP: (!) 148/82 SpO2: 100 %  EBL: None  Complications: No immediate post-treatment complications observed by team, or reported by patient.  Note: The patient tolerated the entire procedure well. A repeat set of vitals were taken after the procedure and the patient was kept under observation following institutional policy, for this type of procedure. Post-procedural neurological assessment was performed, showing return to baseline, prior to discharge. The patient was provided with post-procedure discharge instructions, including a section on how to identify potential problems. Should any problems arise concerning this procedure, the patient was given instructions to immediately contact us, at any time, without hesitation. In any case, we plan to contact the patient by telephone for a follow-up status report regarding this interventional procedure.  Comments:  No additional relevant information.  Plan of Care    Imaging Orders     DG C-Arm 1-60 Min-No Report  Procedure Orders     LUMBAR FACET(MEDIAL BRANCH NERVE BLOCK) MBNB  Medications ordered for procedure: Meds ordered this  encounter  Medications  . lidocaine (XYLOCAINE) 2 % (with pres) injection 400 mg  . midazolam (VERSED) 5 MG/5ML injection 1-2 mg    Make sure Flumazenil is available in the  pyxis when using this medication. If oversedation occurs, administer 0.2 mg IV over 15 sec. If after 45 sec no response, administer 0.2 mg again over 1 min; may repeat at 1 min intervals; not to exceed 4 doses (1 mg)  . fentaNYL (SUBLIMAZE) injection 25-50 mcg    Make sure Narcan is available in the pyxis when using this medication. In the event of respiratory depression (RR< 8/min): Titrate NARCAN (naloxone) in increments of 0.1 to 0.2 mg IV at 2-3 minute intervals, until desired degree of reversal.  . lactated ringers infusion 1,000 mL  . ropivacaine (PF) 2 mg/mL (0.2%) (NAROPIN) injection 18 mL  . triamcinolone acetonide (KENALOG-40) injection 80 mg   Medications administered: We administered lidocaine, midazolam, fentaNYL, lactated ringers, ropivacaine (PF) 2 mg/mL (0.2%), and triamcinolone acetonide.  See the medical record for exact dosing, route, and time of administration.  Disposition: Discharge home  Discharge Date & Time: 05/09/2018; 0941 hrs.   Physician-requested Follow-up: Return for post-procedure eval (2 wks), w/ Dr. Dossie Arbour.  Future Appointments  Date Time Provider [place]  05/22/2018 10:15 AM Milinda Pointer, MD [facility] None  08/21/2018  8:40 AM Bacigalupo, [name], MD [facility] None   Primary Care Physician: Virginia Crews, MD Location: [facility] Note by: Gaspar Cola, MD Date: 05/09/2018; Time: 10:05 AM  Disclaimer:  Medicine is not an exact science. The only guarantee in medicine is that nothing is guaranteed. It is important to note that the decision to proceed with this intervention was based on the information collected from the patient. The Data and conclusions were drawn from the patient's questionnaire, the interview, and the physical  examination. Because the information was provided in large part by the patient, it cannot be guaranteed that it has not been purposely or unconsciously manipulated. Every effort has been made to obtain as much relevant data as possible for this evaluation. It is important to note that the conclusions that lead to this procedure are derived in large part from the available data. Always take into account that the treatment will also be dependent on availability of resources and existing treatment guidelines, considered by other Pain Management Practitioners as being common knowledge and practice, at the time of the intervention. For Medico-Legal purposes, it is also important to point out that variation in procedural techniques and pharmacological choices are the acceptable norm. The indications, contraindications, technique, and results of the above procedure should only be interpreted and judged by a Board-Certified Interventional Pain Specialist with extensive familiarity and expertise in the same exact procedure and technique.

## 2018-05-09 NOTE — Progress Notes (Signed)
Safety precautions to be maintained throughout the outpatient stay will include: orient to surroundings, keep bed in low position, maintain call bell within reach at all times, provide assistance with transfer out of bed and ambulation.  

## 2018-05-09 NOTE — Addendum Note (Signed)
Addended by: Leotis Shames F on: 05/09/2018 10:56 AM   Modules accepted: Orders

## 2018-05-09 NOTE — Patient Instructions (Signed)

## 2018-05-10 ENCOUNTER — Telehealth: Payer: Self-pay | Admitting: *Deleted

## 2018-05-10 NOTE — Telephone Encounter (Signed)
Attempted to call for post procedure follow-up. Message left. 

## 2018-05-15 ENCOUNTER — Encounter: Payer: Self-pay | Admitting: Physician Assistant

## 2018-05-15 ENCOUNTER — Ambulatory Visit (INDEPENDENT_AMBULATORY_CARE_PROVIDER_SITE_OTHER): Payer: 59 | Admitting: Physician Assistant

## 2018-05-15 VITALS — BP 145/89 | HR 73 | Temp 98.7°F | Resp 16 | Wt 158.2 lb

## 2018-05-15 DIAGNOSIS — J0100 Acute maxillary sinusitis, unspecified: Secondary | ICD-10-CM | POA: Diagnosis not present

## 2018-05-15 MED ORDER — AMOXICILLIN-POT CLAVULANATE 875-125 MG PO TABS: 1.0000 | ORAL_TABLET | Freq: Two times a day (BID) | ORAL | 0 refills | Status: AC

## 2018-05-15 NOTE — Patient Instructions (Signed)

## 2018-05-15 NOTE — Progress Notes (Signed)
Patient: Elizabeth Bowers Female    DOB: Jun 10, 1965   52 y.o.   MRN: [id number] Visit Date: 05/15/2018  Today's Provider: Trinna Post, PA-C   Chief Complaint  Patient presents with  . Cough   Subjective:    Cough  This is a new problem. The current episode started 1 to 4 weeks ago (Before Thanksgiving she started with the congestion ). The problem has been unchanged. The problem occurs constantly. The cough is productive of sputum. Associated symptoms include chills, ear congestion, ear pain, headaches, nasal congestion, postnasal drip, rhinorrhea, a sore throat and shortness of breath. Pertinent negatives include no fever or wheezing. Nothing aggravates the symptoms. She has tried OTC cough suppressant for the symptoms. The treatment provided no relief.       Allergies  Allergen Reactions  . Ranitidine Diarrhea and Other (See Comments)    Diarrhea and stomach cramping  . Amphetamine-Dextroamphetamine Other (See Comments)    Elevated BP  . Lactose Intolerance (Gi)     bloating  . Reglan [Metoclopramide] Other (See Comments)    Increased prolactin level   . Topiramate Er Other (See Comments)    Severe headache, nausea, vomiting, dizziness, visual disturbance    . Wheat Bran     bloating  . Sulfa Antibiotics Anxiety     Current Outpatient Medications:  .  docusate sodium (COLACE) 100 MG capsule, Take 200 mg by mouth at bedtime. , Disp: , Rfl:  .  DULoxetine (CYMBALTA) 60 MG capsule, TAKE 1 CAPSULE (60 MG TOTAL) BY MOUTH DAILY., Disp: 30 capsule, Rfl: 11 .  lansoprazole (PREVACID) 30 MG capsule, TAKE 1 CAPSULE BY MOUTH TWICE DAILY, Disp: 60 capsule, Rfl: 1 .  LINZESS 290 MCG CAPS capsule, Take 1 capsule (290 mcg total) by mouth daily., Disp: 90 capsule, Rfl: 3 .  Magnesium Oxide 500 MG CAPS, Take 1 capsule (500 mg total) by mouth at bedtime as needed and may repeat dose one time if needed., Disp: 90 capsule, Rfl: 0 .  Melatonin 10 MG CAPS, Take by mouth., Disp: ,  Rfl:  .  Multiple Vitamin (MULTIVITAMIN WITH MINERALS) TABS tablet, Take 1 tablet by mouth daily., Disp: , Rfl:  .  potassium chloride SA (K-DUR,KLOR-CON) 20 MEQ tablet, Take 1 tablet (20 mEq total) by mouth daily., Disp: 30 tablet, Rfl: 5 .  triamterene-hydrochlorothiazide (MAXZIDE-25) 37.5-25 MG tablet, Take 1 tablet by mouth daily., Disp: 90 tablet, Rfl: 0 .  vitamin C (ASCORBIC ACID) 500 MG tablet, Take 500 mg by mouth daily., Disp: , Rfl:  .  meloxicam (MOBIC) 15 MG tablet, Take 1 tablet (15 mg total) by mouth daily. (Patient not taking: Reported on 05/15/2018), Disp: 30 tablet, Rfl: 3 .  methylPREDNISolone (MEDROL DOSEPAK) 4 MG TBPK tablet, 6 day dose pack - take as directed (Patient not taking: Reported on 05/15/2018), Disp: 21 tablet, Rfl: 0 .  montelukast (SINGULAIR) 10 MG tablet, Take 1 tablet (10 mg total) by mouth at bedtime. (Patient not taking: Reported on 05/09/2018), Disp: 90 tablet, Rfl: 3 .  NONFORMULARY OR COMPOUNDED ITEM, [facility]:  General Pain Cream Two - Ibuprofen 15%, Baclofen 1%, Gabapentin 3%, Lidocaine 2%, apply 1-2 grams to affected area 3-4 times daily. (Patient not taking: Reported on 05/15/2018), Disp: 120 each, Rfl: 3 .  phentermine 37.5 MG capsule, Take 37.5 mg by mouth every morning., Disp: , Rfl:   Review of Systems  Constitutional: Positive for chills. Negative for fever.  HENT: Positive for  congestion, ear pain, postnasal drip, rhinorrhea, sinus pressure, sinus pain, sneezing, sore throat and voice change.   Respiratory: Positive for cough, chest tightness and shortness of breath. Negative for wheezing.   Neurological: Positive for headaches.    Social History   Tobacco Use  . Smoking status: Former Smoker    Packs/day: 0.75    Years: 5.00    Pack years: 3.75    Types: Cigarettes    Last attempt to quit: 07/06/1987    Years since quitting: 30.8  . Smokeless tobacco: Never Used  Substance Use Topics  . Alcohol use: Yes    Alcohol/week: 2.0  - 4.0 standard drinks    Types: 2 - 4 Cans of beer per week    Comment: wine cooler   Objective:   BP (!) 145/89 (BP Location: Left Arm, Patient Position: Sitting, Cuff Size: Normal)   Pulse 73   Temp 98.7 F (37.1 C) (Oral)   Resp 16   Wt 158 lb 3.2 oz (71.8 kg)   SpO2 98%   BMI 28.94 kg/m  Vitals:   05/15/18 1546  BP: (!) 145/89  Pulse: 73  Resp: 16  Temp: 98.7 F (37.1 C)  TempSrc: Oral  SpO2: 98%  Weight: 158 lb 3.2 oz (71.8 kg)     Physical Exam Constitutional:      General: She is not in acute distress.    Appearance: She is well-developed. She is not diaphoretic.  HENT:     Right Ear: External ear normal.     Left Ear: External ear normal.     Nose:     Right Sinus: Maxillary sinus tenderness and frontal sinus tenderness present.     Left Sinus: Maxillary sinus tenderness and frontal sinus tenderness present.     Mouth/Throat:     Pharynx: No oropharyngeal exudate or posterior oropharyngeal erythema.  Eyes:     General:        Right eye: No discharge.        Left eye: No discharge.     Conjunctiva/sclera: Conjunctivae normal.  Neck:     Musculoskeletal: Neck supple.  Cardiovascular:     Rate and Rhythm: Normal rate and regular rhythm.  Pulmonary:     Effort: Pulmonary effort is normal.     Breath sounds: Normal breath sounds.  Lymphadenopathy:     Cervical: Cervical adenopathy present.  Skin:    General: Skin is warm and dry.  Neurological:     Mental Status: She is alert and oriented to person, place, and time.  Psychiatric:        Behavior: Behavior normal.         Assessment & Plan:     1. Acute non-recurrent maxillary sinusitis  - amoxicillin-clavulanate (AUGMENTIN) 875-125 MG tablet; Take 1 tablet by mouth 2 (two) times daily for 10 days.  Dispense: 20 tablet; Refill: 0  Return if symptoms worsen or fail to improve.  The entirety of the information documented in the History of Present Illness, Review of Systems and Physical Exam were  personally obtained by me. Portions of this information were initially documented by Lyndel Pleasure, CMA and reviewed by me for thoroughness and accuracy.            Trinna Post, PA-C  [facility]

## 2018-05-16 ENCOUNTER — Telehealth: Payer: Self-pay | Admitting: Family Medicine

## 2018-05-16 DIAGNOSIS — J0100 Acute maxillary sinusitis, unspecified: Secondary | ICD-10-CM

## 2018-05-16 NOTE — Telephone Encounter (Signed)
Pt cannot stop burping deeply.  Pt needing to go to work tomorrow and needing the new Rx before the end of work day today.  Thanks, [employer]

## 2018-05-16 NOTE — Telephone Encounter (Signed)
Pt's husband states the antibiotics given to her yesterday are upsetting her stomach and she is wanting to know if there is anything else that can be called in.    Pt uses [facility].

## 2018-05-16 NOTE — Telephone Encounter (Signed)
Will forward to [facility], who evaluated her and prescribed abx.

## 2018-05-17 MED ORDER — AMOXICILLIN 875 MG PO TABS: 875.0000 mg | ORAL_TABLET | Freq: Two times a day (BID) | ORAL | 0 refills | Status: DC

## 2018-05-17 NOTE — Telephone Encounter (Signed)
Sent her in plain amoxicillin 875 mg BID x 7 days.

## 2018-05-20 ENCOUNTER — Other Ambulatory Visit: Payer: Self-pay

## 2018-05-20 ENCOUNTER — Ambulatory Visit: Payer: 59 | Attending: Pain Medicine | Admitting: Pain Medicine

## 2018-05-20 ENCOUNTER — Encounter: Payer: Self-pay | Admitting: Pain Medicine

## 2018-05-20 VITALS — BP 132/83 | HR 77 | Temp 98.4°F | Resp 18 | Ht 62.0 in | Wt 155.0 lb

## 2018-05-20 DIAGNOSIS — G4486 Cervicogenic headache: Secondary | ICD-10-CM

## 2018-05-20 DIAGNOSIS — F419 Anxiety disorder, unspecified: Secondary | ICD-10-CM | POA: Diagnosis not present

## 2018-05-20 DIAGNOSIS — M47816 Spondylosis without myelopathy or radiculopathy, lumbar region: Secondary | ICD-10-CM

## 2018-05-20 DIAGNOSIS — Z882 Allergy status to sulfonamides status: Secondary | ICD-10-CM | POA: Insufficient documentation

## 2018-05-20 DIAGNOSIS — Z87891 Personal history of nicotine dependence: Secondary | ICD-10-CM | POA: Insufficient documentation

## 2018-05-20 DIAGNOSIS — K219 Gastro-esophageal reflux disease without esophagitis: Secondary | ICD-10-CM | POA: Diagnosis not present

## 2018-05-20 DIAGNOSIS — M542 Cervicalgia: Secondary | ICD-10-CM | POA: Diagnosis not present

## 2018-05-20 DIAGNOSIS — Z9049 Acquired absence of other specified parts of digestive tract: Secondary | ICD-10-CM | POA: Diagnosis not present

## 2018-05-20 DIAGNOSIS — M79601 Pain in right arm: Secondary | ICD-10-CM | POA: Insufficient documentation

## 2018-05-20 DIAGNOSIS — R51 Headache: Secondary | ICD-10-CM | POA: Diagnosis not present

## 2018-05-20 DIAGNOSIS — M25561 Pain in right knee: Secondary | ICD-10-CM | POA: Insufficient documentation

## 2018-05-20 DIAGNOSIS — Z9889 Other specified postprocedural states: Secondary | ICD-10-CM | POA: Diagnosis not present

## 2018-05-20 DIAGNOSIS — M5136 Other intervertebral disc degeneration, lumbar region: Secondary | ICD-10-CM | POA: Diagnosis not present

## 2018-05-20 DIAGNOSIS — N183 Chronic kidney disease, stage 3 (moderate): Secondary | ICD-10-CM | POA: Insufficient documentation

## 2018-05-20 DIAGNOSIS — M47812 Spondylosis without myelopathy or radiculopathy, cervical region: Secondary | ICD-10-CM | POA: Insufficient documentation

## 2018-05-20 DIAGNOSIS — M79672 Pain in left foot: Secondary | ICD-10-CM | POA: Diagnosis not present

## 2018-05-20 DIAGNOSIS — M5441 Lumbago with sciatica, right side: Secondary | ICD-10-CM

## 2018-05-20 DIAGNOSIS — M79602 Pain in left arm: Secondary | ICD-10-CM | POA: Insufficient documentation

## 2018-05-20 DIAGNOSIS — M5442 Lumbago with sciatica, left side: Secondary | ICD-10-CM | POA: Diagnosis not present

## 2018-05-20 DIAGNOSIS — G894 Chronic pain syndrome: Secondary | ICD-10-CM | POA: Insufficient documentation

## 2018-05-20 DIAGNOSIS — I129 Hypertensive chronic kidney disease with stage 1 through stage 4 chronic kidney disease, or unspecified chronic kidney disease: Secondary | ICD-10-CM | POA: Diagnosis not present

## 2018-05-20 DIAGNOSIS — Z90710 Acquired absence of both cervix and uterus: Secondary | ICD-10-CM | POA: Insufficient documentation

## 2018-05-20 DIAGNOSIS — M797 Fibromyalgia: Secondary | ICD-10-CM | POA: Insufficient documentation

## 2018-05-20 DIAGNOSIS — Z79899 Other long term (current) drug therapy: Secondary | ICD-10-CM | POA: Diagnosis not present

## 2018-05-20 DIAGNOSIS — R1012 Left upper quadrant pain: Secondary | ICD-10-CM | POA: Insufficient documentation

## 2018-05-20 DIAGNOSIS — G8929 Other chronic pain: Secondary | ICD-10-CM

## 2018-05-20 DIAGNOSIS — M4310 Spondylolisthesis, site unspecified: Secondary | ICD-10-CM

## 2018-05-20 DIAGNOSIS — M4802 Spinal stenosis, cervical region: Secondary | ICD-10-CM | POA: Diagnosis not present

## 2018-05-20 DIAGNOSIS — M549 Dorsalgia, unspecified: Secondary | ICD-10-CM | POA: Diagnosis present

## 2018-05-20 DIAGNOSIS — M533 Sacrococcygeal disorders, not elsewhere classified: Secondary | ICD-10-CM | POA: Diagnosis not present

## 2018-05-20 DIAGNOSIS — M79605 Pain in left leg: Secondary | ICD-10-CM

## 2018-05-20 DIAGNOSIS — M79604 Pain in right leg: Secondary | ICD-10-CM

## 2018-05-20 DIAGNOSIS — Z888 Allergy status to other drugs, medicaments and biological substances status: Secondary | ICD-10-CM | POA: Insufficient documentation

## 2018-05-20 NOTE — Progress Notes (Signed)
Safety precautions to be maintained throughout the outpatient stay will include: orient to surroundings, keep bed in low position, maintain call bell within reach at all times, provide assistance with transfer out of bed and ambulation.  

## 2018-05-20 NOTE — Patient Instructions (Addendum)
____________________________________________________________________________________________  Preparing for Procedure with Sedation  Instructions: . Oral Intake: Do not eat or drink anything for at least 8 hours prior to your procedure. . Transportation: Public transportation is not allowed. Bring an adult driver. The driver must be physically present in our waiting room before any procedure can be started. . Physical Assistance: Bring an adult physically capable of assisting you, in the event you need help. This adult should keep you company at home for at least 6 hours after the procedure. . Blood Pressure Medicine: Take your blood pressure medicine with a sip of water the morning of the procedure. . Blood thinners: Notify our staff if you are taking any blood thinners. Depending on which one you take, there will be specific instructions on how and when to stop it. . Diabetics on insulin: Notify the staff so that you can be scheduled 1st case in the morning. If your diabetes requires high dose insulin, take only  of your normal insulin dose the morning of the procedure and notify the staff that you have done so. . Preventing infections: Shower with an antibacterial soap the morning of your procedure. . Build-up your immune system: Take 1000 mg of Vitamin C with every meal (3 times a day) the day prior to your procedure. . Antibiotics: Inform the staff if you have a condition or reason that requires you to take antibiotics before dental procedures. . Pregnancy: If you are pregnant, call and cancel the procedure. . Sickness: If you have a cold, fever, or any active infections, call and cancel the procedure. . Arrival: You must be in the facility at least 30 minutes prior to your scheduled procedure. . Children: Do not bring children with you. . Dress appropriately: Bring dark clothing that you would not mind if they get stained. . Valuables: Do not bring any jewelry or valuables.  Procedure  appointments are reserved for interventional treatments only. . No Prescription Refills. . No medication changes will be discussed during procedure appointments. . No disability issues will be discussed.  Reasons to call and reschedule or cancel your procedure: (Following these recommendations will minimize the risk of a serious complication.) . Surgeries: Avoid having procedures within 2 weeks of any surgery. (Avoid for 2 weeks before or after any surgery). . Flu Shots: Avoid having procedures within 2 weeks of a flu shots or . (Avoid for 2 weeks before or after immunizations). . Barium: Avoid having a procedure within 7-10 days after having had a radiological study involving the use of radiological contrast. (Myelograms, Barium swallow or enema study). . Heart attacks: Avoid any elective procedures or surgeries for the initial 6 months after a "Myocardial Infarction" (Heart Attack). . Blood thinners: It is imperative that you stop these medications before procedures. Let us know if you if you take any blood thinner.  . Infection: Avoid procedures during or within two weeks of an infection (including chest colds or gastrointestinal problems). Symptoms associated with infections include: Localized redness, fever, chills, night sweats or profuse sweating, burning sensation when voiding, cough, congestion, stuffiness, runny nose, sore throat, diarrhea, nausea, vomiting, cold or Flu symptoms, recent or current infections. It is specially important if the infection is over the area that we intend to treat. . Heart and lung problems: Symptoms that may suggest an active cardiopulmonary problem include: cough, chest pain, breathing difficulties or shortness of breath, dizziness, ankle swelling, uncontrolled high or unusually low blood pressure, and/or palpitations. If you are experiencing any of these symptoms, cancel   your procedure and contact your primary care physician for an evaluation.  Remember:   Regular Business hours are:  Monday to Thursday 8:00 AM to 4:00 PM  Provider's Schedule: Milinda Pointer, MD:  Procedure days: Tuesday and Thursday 7:30 AM to 4:00 PM  Gillis Santa, MD:  Procedure days: Monday and Wednesday 7:30 AM to 4:00 PM ____________________________________________________________________________________________   Preparing for Procedure with Sedation Instructions: . Oral Intake: Do not eat or drink anything for at least 8 hours prior to your procedure. . Transportation: Public transportation is not allowed. Bring an adult driver. The driver must be physically present in our waiting room before any procedure can be started. Marland Kitchen Physical Assistance: Bring an adult capable of physically assisting you, in the event you need help. . Blood Pressure Medicine: Take your blood pressure medicine with a sip of water the morning of the procedure. . Insulin: Take only  of your normal insulin dose. . Preventing infections: Shower with an antibacterial soap the morning of your procedure. . Build-up your immune system: Take 1000 mg of Vitamin C with every meal (3 times a day) the day prior to your procedure. . Pregnancy: If you are pregnant, call and cancel the procedure. . Sickness: If you have a cold, fever, or any active infections, call and cancel the procedure. . Arrival: You must be in the facility at least 30 minutes prior to your scheduled procedure. . Children: Do not bring children with you. . Dress appropriately: Bring dark clothing that you would not mind if they get stained. . Valuables: Do not bring any jewelry or valuables. Procedure appointments are reserved for interventional treatments only. Marland Kitchen No Prescription Refills. . No medication changes will be discussed during procedure appointments. No disability issues will be discussed.Facet Blocks Patient Information  Description: The facets are joints in the spine between the vertebrae.  Like any joints in the  body, facets can become irritated and painful.  Arthritis can also effect the facets.  By injecting steroids and local anesthetic in and around these joints, we can temporarily block the nerve supply to them.  Steroids act directly on irritated nerves and tissues to reduce selling and inflammation which often leads to decreased pain.  Facet blocks may be done anywhere along the spine from the neck to the low back depending upon the location of your pain.   After numbing the skin with local anesthetic (like Novocaine), a small needle is passed onto the facet joints under x-ray guidance.  You may experience a sensation of pressure while this is being done.  The entire block usually lasts about 15-25 minutes.   Conditions which may be treated by facet blocks:  Low back/buttock pain Neck/shoulder pain Certain types of headaches  Preparation for the injection:  Do not eat any solid food or dairy products within 8 hours of your appointment. You may drink clear liquid up to 3 hours before appointment.  Clear liquids include water, black coffee, juice or soda.  No milk or cream please. You may take your regular medication, including pain medications, with a sip of water before your appointment.  Diabetics should hold regular insulin (if taken separately) and take 1/2 normal NPH dose the morning of the procedure.  Carry some sugar containing items with you to your appointment. A driver must accompany you and be prepared to drive you home after your procedure. Bring all your current medications with you. An IV may be inserted and sedation may be given at the discretion of  the physician. A blood pressure cuff, EKG and other monitors will often be applied during the procedure.  Some patients may need to have extra oxygen administered for a short period. You will be asked to provide medical information, including your allergies and medications, prior to the procedure.  We must know immediately if you are taking  blood thinners (like Coumadin/Warfarin) or if you are allergic to IV iodine contrast (dye).  We must know if you could possible be pregnant.  Possible side-effects:  Bleeding from needle site Infection (rare, may require surgery) Nerve injury (rare) Numbness & tingling (temporary) Difficulty urinating (rare, temporary) Spinal headache (a headache worse with upright posture) Light-headedness (temporary) Pain at injection site (serveral days) Decreased blood pressure (rare, temporary) Weakness in arm/leg (temporary) Pressure sensation in back/neck (temporary)   Call if you experience:  Fever/chills associated with headache or increased back/neck pain Headache worsened by an upright position New onset, weakness or numbness of an extremity below the injection site Hives or difficulty breathing (go to the emergency room) Inflammation or drainage at the injection site(s) Severe back/neck pain greater than usual New symptoms which are concerning to you  Please note:  Although the local anesthetic injected can often make your back or neck feel good for several hours after the injection, the pain will likely return. It takes 3-7 days for steroids to work.  You may not notice any pain relief for at least one week.  If effective, we will often do a series of 2-3 injections spaced 3-6 weeks apart to maximally decrease your pain.  After the initial series, you may be a candidate for a more permanent nerve block of the facets.  If you have any questions, please call #[phone] . [facility]

## 2018-05-20 NOTE — Progress Notes (Signed)
Patient's Name: Elizabeth Bowers  MRN: [id number]  Referring Provider: Virginia Crews, MD  DOB: [birth year]  PCP: Virginia Crews, MD  DOS: 05/20/2018  Note by: Gaspar Cola, MD  Service setting: Ambulatory outpatient  Specialty: Interventional Pain Management  Location: [facility] (AMB) Pain Management Facility    Patient type: Established   Primary Reason(s) for Visit: Encounter for post-procedure evaluation of chronic illness with mild to moderate exacerbation CC: Back Pain  HPI  Elizabeth Bowers is a 52 y.o. year old, female patient, who comes today for a post-procedure evaluation. She has Rectal bleeding; Chronic sacroiliac joint pain (Right); Hypertension; Superficial thrombophlebitis; Fatty liver; Colon polyps; Fibromyalgia; Sinusitis, chronic; Allergic rhinitis; IBS (irritable bowel syndrome); Chronic GERD; Migraine; Eyelid dermatitis, allergic/contact; Anxiety; Chronic knee pain (Right); DDD (degenerative disc disease), lumbar; Family hx of colon cancer; LUQ abdominal pain; Overweight; Chronic low back pain (Primary Area of Pain) (Bilateral) (L>R) w/ sciatica (Bilateral); Chronic lower extremity pain (Secondary Area of Pain) (Bilateral) (L>R); Chronic neck pain (Tertiary Area of Pain) (midline); Chronic foot pain (Left); Chronic pain syndrome; Pharmacologic therapy; Disorder of skeletal system; Problems influencing health status; Dyspepsia; Ganglion cyst; Chronic upper extremity pain (Fourth Area of Pain) (Bilateral) (R>L); Cervical Grade 1 (2 mm) Retrolisthesis C5 over C6; Cervical foraminal stenosis (C3-4 & C5-6) (Bilateral); DDD (degenerative disc disease), cervical; Long term prescription benzodiazepine use; CKD (chronic kidney disease) stage 3, GFR 30-59 ml/min ([place]); Acid reflux disease; Spondylosis without myelopathy or radiculopathy, cervical region; Chronic hip pain (Bilateral) (L>R); Lumbar facet syndrome (Bilateral) (R>L); Spondylosis without myelopathy or radiculopathy, lumbar region;  Chronic sacroiliac joint dysfunction (Right); Chronic somatic sacroiliac joint dysfunction (Right); Osteoarthritis of hip (Bilateral); Cervicogenic headache (Bilateral); Cervicalgia (Bilateral); Cervical facet syndrome (Bilateral); and Chronic occipital neuralgia (Bilateral) on their problem list. Her primarily concern today is the Back Pain  Pain Assessment: Location: Lower Back Radiating: down backs of both legs to heels Onset: More than a month ago Duration: Chronic pain Quality: Stabbing(back begins to hurt after having been on feet for approx. 4 hours) Severity: 1 /10 (subjective, self-reported pain score)  Note: Reported level is compatible with observation.                         When using our objective Pain Scale, levels between 6 and 10/10 are said to belong in an emergency room, as it progressively worsens from a 6/10, described as severely limiting, requiring emergency care not usually available at an outpatient pain management facility. At a 6/10 level, communication becomes difficult and requires great effort. Assistance to reach the emergency department may be required. Facial flushing and profuse sweating along with potentially dangerous increases in heart rate and blood pressure will be evident. Effect on ADL: limits daily activities Timing: Intermittent Modifying factors: procedure BP: 132/83  HR: 77  Elizabeth Bowers comes in today for post-procedure evaluation.  Further details on both, my assessment(s), as well as the proposed treatment plan, please see below.  Post-Procedure Assessment  05/09/2018 Procedure: Diagnostic bilateral lumbar facet block #1 under fluoroscopic guidance and IV sedation Pre-procedure pain score:  3/10 Post-procedure pain score: 0/10 (100% relief) Influential Factors: BMI: 28.35 kg/m Intra-procedural challenges: None observed.         Assessment challenges: None detected.              Reported side-effects: None.        Post-procedural adverse  reactions or complications: None reported  Sedation: Sedation provided. When no sedatives are used, the analgesic levels obtained are directly associated to the effectiveness of the local anesthetics. However, when sedation is provided, the level of analgesia obtained during the initial 1 hour following the intervention, is believed to be the result of a combination of factors. These factors may include, but are not limited to: 1. The effectiveness of the local anesthetics used. 2. The effects of the analgesic(s) and/or anxiolytic(s) used. 3. The degree of discomfort experienced by the patient at the time of the procedure. 4. The patients ability and reliability in recalling and recording the events. 5. The presence and influence of possible secondary gains and/or psychosocial factors. Reported result: Relief experienced during the 1st hour after the procedure: 100 % (Ultra-Short Term Relief)            Interpretative annotation: Clinically appropriate result. Analgesia during this period is likely to be Local Anesthetic and/or IV Sedative (Analgesic/Anxiolytic) related.          Effects of local anesthetic: The analgesic effects attained during this period are directly associated to the localized infiltration of local anesthetics and therefore cary significant diagnostic value as to the etiological location, or anatomical origin, of the pain. Expected duration of relief is directly dependent on the pharmacodynamics of the local anesthetic used. Long-acting (4-6 hours) anesthetics used.  Reported result: Relief during the next 4 to 6 hour after the procedure: 100 % (Short-Term Relief)            Interpretative annotation: Clinically appropriate result. Analgesia during this period is likely to be Local Anesthetic-related.          Long-term benefit: Defined as the period of time past the expected duration of local anesthetics (1 hour for short-acting and 4-6 hours for long-acting). With the  possible exception of prolonged sympathetic blockade from the local anesthetics, benefits during this period are typically attributed to, or associated with, other factors such as analgesic sensory neuropraxia, antiinflammatory effects, or beneficial biochemical changes provided by agents other than the local anesthetics.  Reported result: Extended relief following procedure: 100 %("pain is much better, i am able to do more than I could before the procedure") (Long-Term Relief)            Interpretative annotation: Clinically possible results. Good relief. No permanent benefit expected. Inflammation plays a part in the etiology to the pain.          Current benefits: Defined as reported results that persistent at this point in time.   Analgesia: 75-100 % Elizabeth Bowers reports improvement of axial and extremity symptoms. Function: Elizabeth Bowers reports improvement in function ROM: Elizabeth Bowers reports improvement in ROM Interpretative annotation: Ongoing benefit. Therapeutic benefit observed. Effective therapeutic approach.          Interpretation: Results would suggest a successful diagnostic intervention.                  Plan:  At this point, we have shown that this patient has lumbar facet syndrome as well as cervical facet syndrome.  This is not unusual at all since the facet joints tend to be affected at multiple levels and regions.  At this time, she indicates doing extremely well with regards to her low back pain but would like for [place] to go back to the cervical region since that pain is beginning to come back and she is beginning to experience some of the cervicogenic headaches.  Laboratory Chemistry  Inflammation Markers (CRP: Acute Phase) (ESR: Chronic Phase) Lab Results  Component Value Date   CRP 8 01/16/2018   ESRSEDRATE 16 01/16/2018                         Rheumatology Markers No results found.  Renal Markers Lab Results  Component Value Date   BUN 11 01/16/2018    CREATININE 1.22 (H) 01/16/2018   BCR 9 01/16/2018   GFRAA 58 (L) 01/16/2018   GFRNONAA 51 (L) 01/16/2018                             Hepatic Markers Lab Results  Component Value Date   AST 21 01/16/2018   ALT 24 11/26/2017   ALBUMIN 4.5 01/16/2018                        Neuropathy Markers Lab Results  Component Value Date   VITAMINB12 1,137 01/16/2018   HGBA1C 5.3 12/13/2017   HIV Non Reactive 12/16/2016                        Hematology Parameters Lab Results  Component Value Date   PLT 303 11/26/2017   HGB 14.6 11/26/2017   HCT 42.6 11/26/2017                        CV Markers No results found.  Note: Lab results reviewed.  Recent Imaging Results   Results for orders placed in visit on 05/09/18  DG C-Arm 1-60 Min-No Report   Narrative Fluoroscopy was utilized by the requesting physician.  No radiographic  interpretation.    Interpretation Report: Fluoroscopy was used during the procedure to assist with needle guidance. The images were interpreted intraoperatively by the requesting physician.  Meds   Current Outpatient Medications:  .  amoxicillin-clavulanate (AUGMENTIN) 875-125 MG tablet, Take 1 tablet by mouth 2 (two) times daily for 10 days., Disp: 20 tablet, Rfl: 0 .  docusate sodium (COLACE) 100 MG capsule, Take 200 mg by mouth at bedtime. , Disp: , Rfl:  .  DULoxetine (CYMBALTA) 60 MG capsule, TAKE 1 CAPSULE (60 MG TOTAL) BY MOUTH DAILY., Disp: 30 capsule, Rfl: 11 .  lansoprazole (PREVACID) 30 MG capsule, TAKE 1 CAPSULE BY MOUTH TWICE DAILY, Disp: 60 capsule, Rfl: 1 .  LINZESS 290 MCG CAPS capsule, Take 1 capsule (290 mcg total) by mouth daily., Disp: 90 capsule, Rfl: 3 .  Magnesium Oxide 500 MG CAPS, Take 1 capsule (500 mg total) by mouth at bedtime as needed and may repeat dose one time if needed., Disp: 90 capsule, Rfl: 0 .  Melatonin 10 MG CAPS, Take by mouth., Disp: , Rfl:  .  Multiple Vitamin (MULTIVITAMIN WITH MINERALS) TABS tablet, Take 1 tablet by  mouth daily., Disp: , Rfl:  .  potassium chloride SA (K-DUR,KLOR-CON) 20 MEQ tablet, Take 1 tablet (20 mEq total) by mouth daily., Disp: 30 tablet, Rfl: 5 .  triamterene-hydrochlorothiazide (MAXZIDE-25) 37.5-25 MG tablet, Take 1 tablet by mouth daily., Disp: 90 tablet, Rfl: 0 .  vitamin C (ASCORBIC ACID) 500 MG tablet, Take 500 mg by mouth daily., Disp: , Rfl:   ROS  Constitutional: Denies any fever or chills Gastrointestinal: No reported hemesis, hematochezia, vomiting, or acute GI distress Musculoskeletal: Denies any acute onset joint swelling, redness, loss of ROM, or  weakness Neurological: No reported episodes of acute onset apraxia, aphasia, dysarthria, agnosia, amnesia, paralysis, loss of coordination, or loss of consciousness  Allergies  Elizabeth Bowers is allergic to ranitidine; amphetamine-dextroamphetamine; lactose intolerance (gi); reglan [metoclopramide]; topiramate er; wheat bran; and sulfa antibiotics.  [place]  Drug: Elizabeth Bowers  reports no history of drug use. Alcohol:  reports current alcohol use of about 2.0 - 4.0 standard drinks of alcohol per week. Tobacco:  reports that she quit smoking about 30 years ago. Her smoking use included cigarettes. She has a 3.75 pack-year smoking history. She has never used smokeless tobacco. Medical:  has a past medical history of Fatty liver, Fibromyalgia, GERD (gastroesophageal reflux disease), Hemorrhoids, Hot flashes, Hypertension, IBS (irritable bowel syndrome), Migraines, Miscarriage, Multilevel degenerative disc disease, and Vocal cord dysfunction. Surgical: Elizabeth Bowers  has a past surgical history that includes Elbow Debridement; Foot surgery; Cholecystectomy; Colonoscopy; Abdominal hysterectomy; and Esophagogastroduodenoscopy (egd) with propofol (N/A, 02/06/2018). Family: family history includes Alcohol abuse in her father and son; Arthritis in her son and son; Asthma in her sister; COPD in her father, paternal grandmother, and sister; Cancer in her  paternal grandfather and sister; Colon cancer in her sister; Colon polyps in her sister; Depression in her son; Diabetes in her maternal grandmother, mother, and son; Drug abuse in her son; Glaucoma in her mother; Hyperlipidemia in her mother; Hypertension in her mother; Kidney disease in her sister; Mental illness in her sister and son; Miscarriages / Stillbirths in her sister and sister; Uterine cancer in her sister.  Constitutional Exam  General appearance: Well nourished, well developed, and well hydrated. In no apparent acute distress Vitals:   05/20/18 1501  BP: 132/83  Pulse: 77  Resp: 18  Temp: 98.4 F (36.9 C)  TempSrc: Oral  SpO2: 100%  Weight: 155 lb (70.3 kg)  Height: '5\' 2"'  (1.575 m)   BMI Assessment: Estimated body mass index is 28.35 kg/m as calculated from the following:   Height as of this encounter: '5\' 2"'  (1.575 m).   Weight as of this encounter: 155 lb (70.3 kg).  BMI interpretation table: BMI level Category Range association with higher incidence of chronic pain  <18 kg/m2 Underweight   18.5-24.9 kg/m2 Ideal body weight   25-29.9 kg/m2 Overweight Increased incidence by 20%  30-34.9 kg/m2 Obese (Class I) Increased incidence by 68%  35-39.9 kg/m2 Severe obesity (Class II) Increased incidence by 136%  >40 kg/m2 Extreme obesity (Class III) Increased incidence by 254%   Patient's current BMI Ideal Body weight  Body mass index is 28.35 kg/m. Ideal body weight: 50.1 kg (110 lb 7.2 oz) Adjusted ideal body weight: 58.2 kg (128 lb 4.3 oz)   BMI Readings from Last 4 Encounters:  05/20/18 28.35 kg/m  05/15/18 28.94 kg/m  05/09/18 28.17 kg/m  04/17/18 28.35 kg/m   Wt Readings from Last 4 Encounters:  05/20/18 155 lb (70.3 kg)  05/15/18 158 lb 3.2 oz (71.8 kg)  05/09/18 154 lb (69.9 kg)  04/17/18 155 lb (70.3 kg)  Psych/Mental status: Alert, oriented x 3 (person, place, & time)       Eyes: PERLA Respiratory: No evidence of acute respiratory  distress  Cervical Spine Area Exam  Skin & Axial Inspection: No masses, redness, edema, swelling, or associated skin lesions Alignment: Symmetrical Functional ROM: Decreased ROM      Stability: No instability detected Muscle Tone/Strength: Guarding observed Sensory (Neurological): Movement-associated pain Palpation: Complains of area being tender to palpation  Upper Extremity (UE) Exam    Side: Right upper extremity  Side: Left upper extremity  Skin & Extremity Inspection: Skin color, temperature, and hair growth are WNL. No peripheral edema or cyanosis. No masses, redness, swelling, asymmetry, or associated skin lesions. No contractures.  Skin & Extremity Inspection: Skin color, temperature, and hair growth are WNL. No peripheral edema or cyanosis. No masses, redness, swelling, asymmetry, or associated skin lesions. No contractures.  Functional ROM: Unrestricted ROM          Functional ROM: Unrestricted ROM          Muscle Tone/Strength: Functionally intact. No obvious neuro-muscular anomalies detected.  Muscle Tone/Strength: Functionally intact. No obvious neuro-muscular anomalies detected.  Sensory (Neurological): Unimpaired          Sensory (Neurological): Unimpaired          Palpation: No palpable anomalies              Palpation: No palpable anomalies              Provocative Test(s):  Phalen's test: deferred Tinel's test: deferred Apley's scratch test (touch opposite shoulder):  Action 1 (Across chest): deferred Action 2 (Overhead): deferred Action 3 (LB reach): deferred   Provocative Test(s):  Phalen's test: deferred Tinel's test: deferred Apley's scratch test (touch opposite shoulder):  Action 1 (Across chest): deferred Action 2 (Overhead): deferred Action 3 (LB reach): deferred    Thoracic Spine Area Exam  Skin & Axial Inspection: No masses, redness, or swelling Alignment: Symmetrical Functional ROM: Unrestricted ROM Stability: No instability  detected Muscle Tone/Strength: Functionally intact. No obvious neuro-muscular anomalies detected. Sensory (Neurological): Unimpaired Muscle strength & Tone: No palpable anomalies  Lumbar Spine Area Exam  Skin & Axial Inspection: No masses, redness, or swelling Alignment: Symmetrical Functional ROM: Improved after treatment       Stability: No instability detected Muscle Tone/Strength: Functionally intact. No obvious neuro-muscular anomalies detected. Sensory (Neurological): Unimpaired Palpation: No palpable anomalies       Provocative Tests: Hyperextension/rotation test: Improved after treatment       Lumbar quadrant test (Kemp's test): Improved after treatment       Lateral bending test: deferred today       Patrick's Maneuver: deferred today                   FABER* test: deferred today                   S-I anterior distraction/compression test: deferred today         S-I lateral compression test: deferred today         S-I Thigh-thrust test: deferred today         S-I Gaenslen's test: deferred today         *(Flexion, ABduction and External Rotation)  Gait & Posture Assessment  Ambulation: Unassisted Gait: Relatively normal for age and body habitus Posture: WNL   Lower Extremity Exam    Side: Right lower extremity  Side: Left lower extremity  Stability: No instability observed          Stability: No instability observed          Skin & Extremity Inspection: Skin color, temperature, and hair growth are WNL. No peripheral edema or cyanosis. No masses, redness, swelling, asymmetry, or associated skin lesions. No contractures.  Skin & Extremity Inspection: Skin color, temperature, and hair growth are WNL. No peripheral edema or cyanosis. No masses, redness, swelling, asymmetry, or associated skin lesions.  No contractures.  Functional ROM: Unrestricted ROM                  Functional ROM: Unrestricted ROM                  Muscle Tone/Strength: Functionally intact. No obvious  neuro-muscular anomalies detected.  Muscle Tone/Strength: Functionally intact. No obvious neuro-muscular anomalies detected.  Sensory (Neurological): Unimpaired        Sensory (Neurological): Unimpaired        DTR: Patellar: deferred today Achilles: deferred today Plantar: deferred today  DTR: Patellar: deferred today Achilles: deferred today Plantar: deferred today  Palpation: No palpable anomalies  Palpation: No palpable anomalies   Assessment  Primary Diagnosis & Pertinent Problem List: The primary encounter diagnosis was Chronic low back pain (Primary Area of Pain) (Bilateral) (L>R) w/ sciatica (Bilateral). Diagnoses of Lumbar facet syndrome (Bilateral) (R>L), Chronic lower extremity pain (Secondary Area of Pain) (Bilateral) (L>R), Chronic neck pain (Tertiary Area of Pain) (midline), Cervical Grade 1 (2 mm) Retrolisthesis C5 over C6, Cervical facet syndrome (Bilateral), Cervicalgia (Bilateral), Spondylosis without myelopathy or radiculopathy, cervical region, Cervicogenic headache (Bilateral), and Chronic upper extremity pain (Fourth Area of Pain) (Bilateral) (R>L) were also pertinent to this visit.  Status Diagnosis  Resolved Improved Resolved 1. Chronic low back pain (Primary Area of Pain) (Bilateral) (L>R) w/ sciatica (Bilateral)   2. Lumbar facet syndrome (Bilateral) (R>L)   3. Chronic lower extremity pain (Secondary Area of Pain) (Bilateral) (L>R)   4. Chronic neck pain (Tertiary Area of Pain) (midline)   5. Cervical Grade 1 (2 mm) Retrolisthesis C5 over C6   6. Cervical facet syndrome (Bilateral)   7. Cervicalgia (Bilateral)   8. Spondylosis without myelopathy or radiculopathy, cervical region   9. Cervicogenic headache (Bilateral)   10. Chronic upper extremity pain (Fourth Area of Pain) (Bilateral) (R>L)     Problems updated and reviewed during this visit: No problems updated. Plan of Care  Pharmacotherapy (Medications Ordered): No orders of the defined types were placed  in this encounter.  Medications administered today: Elizabeth Bowers had no medications administered during this visit.   Procedure Orders     LUMBAR FACET(MEDIAL BRANCH NERVE BLOCK) MBNB     CERVICAL FACET (MEDIAL BRANCH NERVE BLOCK)  Lab Orders  No laboratory test(s) ordered today   Imaging Orders  No imaging studies ordered today   Referral Orders  No referral(s) requested today   Interventional management options: Planned, scheduled, and/or pending:   Diagnostic bilateral cervical facet block #2under fluoroscopic guidance and IV sedation    Considering:   Diagnostic bilateral cervical facet block Possible bilateral cervical facet RFA Diagnostic bilateral lumbar facet block Possible bilateral lumbar facet RFA Diagnostic right-sided sacroiliac joint block Possible right-sided sacroiliac joint RFA Diagnostic bilateral intra-articular hip joint injection with local anesthetic and steroid Diagnostic bilateral femoral nerve block + obturator nerve block Possible bilateral femoral nerve + obturator nerve RFA Diagnostic bilateral L5 transforaminal ESI Diagnostic bilateral intra-articular knee joint injection with local anesthetic and steroid Therapeutic series of 5 intra-articular bilateral Hyalgan knee injections Diagnostic bilateral genicular nerve block Possible bilateral genicular nerve RFA   Palliative PRN treatment(s):   Diagnostic bilateral cervical facet block #2 Diagnostic bilateral lumbar facet block #2   Provider-requested follow-up: Return for Procedure (w/ sedation): (B) C-FCT #2.  Future Appointments  Date Time Provider [place]  08/21/2018  8:40 AM Bacigalupo, [name], MD [id number] None   Primary Care Physician: Virginia Crews, MD Location: [facility]  Outpatient Pain Management Facility Note by: Gaspar Cola, MD Date: 05/20/2018; Time: 4:53 PM

## 2018-05-22 ENCOUNTER — Ambulatory Visit: Payer: 59 | Admitting: Pain Medicine

## 2018-05-30 ENCOUNTER — Telehealth: Payer: Self-pay

## 2018-05-30 NOTE — Telephone Encounter (Signed)
Left message with patient and notified her that the secretary would be calling her to schedule appointment when they return on Monday.

## 2018-05-30 NOTE — Telephone Encounter (Signed)
Would like to schedule a cervical block for migraines.  Please call.

## 2018-06-13 ENCOUNTER — Ambulatory Visit (HOSPITAL_BASED_OUTPATIENT_CLINIC_OR_DEPARTMENT_OTHER): Payer: 59 | Admitting: Pain Medicine

## 2018-06-13 ENCOUNTER — Ambulatory Visit
Admission: RE | Admit: 2018-06-13 | Discharge: 2018-06-13 | Disposition: A | Discharge status: Home / Self Care | Payer: 59 | Source: Ambulatory Visit | Attending: Pain Medicine | Admitting: Pain Medicine

## 2018-06-13 ENCOUNTER — Other Ambulatory Visit: Payer: Self-pay | Admitting: Family Medicine

## 2018-06-13 ENCOUNTER — Encounter: Payer: Self-pay | Admitting: Pain Medicine

## 2018-06-13 ENCOUNTER — Other Ambulatory Visit: Payer: Self-pay

## 2018-06-13 VITALS — BP 151/80 | HR 60 | Temp 98.2°F | Resp 17 | Ht 62.0 in | Wt 169.0 lb

## 2018-06-13 DIAGNOSIS — M47812 Spondylosis without myelopathy or radiculopathy, cervical region: Secondary | ICD-10-CM | POA: Diagnosis not present

## 2018-06-13 DIAGNOSIS — M542 Cervicalgia: Secondary | ICD-10-CM | POA: Diagnosis not present

## 2018-06-13 DIAGNOSIS — M5030 Other cervical disc degeneration, unspecified cervical region: Secondary | ICD-10-CM

## 2018-06-13 DIAGNOSIS — G8929 Other chronic pain: Secondary | ICD-10-CM | POA: Insufficient documentation

## 2018-06-13 DIAGNOSIS — R51 Headache: Secondary | ICD-10-CM | POA: Diagnosis not present

## 2018-06-13 DIAGNOSIS — G4486 Cervicogenic headache: Secondary | ICD-10-CM

## 2018-06-13 DIAGNOSIS — M4310 Spondylolisthesis, site unspecified: Secondary | ICD-10-CM | POA: Insufficient documentation

## 2018-06-13 MED ORDER — TRIAMTERENE-HCTZ 37.5-25 MG PO TABS: 1.0000 | ORAL_TABLET | Freq: Every day | ORAL | 3 refills | Status: DC

## 2018-06-13 MED ORDER — LACTATED RINGERS IV SOLN
1000.0000 mL | Freq: Once | INTRAVENOUS | Status: AC
  Administered 2018-06-13: 1000 mL via INTRAVENOUS

## 2018-06-13 MED ORDER — LIDOCAINE HCL 2 % IJ SOLN
20.0000 mL | Freq: Once | INTRAMUSCULAR | Status: AC
  Administered 2018-06-13: 400 mg
  Filled 2018-06-13: qty 40

## 2018-06-13 MED ORDER — FENTANYL CITRATE (PF) 100 MCG/2ML IJ SOLN
25.0000 ug | INTRAMUSCULAR | Status: DC | PRN
  Administered 2018-06-13: 100 ug via INTRAVENOUS
  Filled 2018-06-13: qty 2

## 2018-06-13 MED ORDER — MIDAZOLAM HCL 5 MG/5ML IJ SOLN
1.0000 mg | INTRAMUSCULAR | Status: DC | PRN
  Administered 2018-06-13: 4 mg via INTRAVENOUS
  Filled 2018-06-13: qty 5

## 2018-06-13 MED ORDER — ROPIVACAINE HCL 2 MG/ML IJ SOLN
18.0000 mL | Freq: Once | INTRAMUSCULAR | Status: AC
  Administered 2018-06-13: 18 mL via PERINEURAL
  Filled 2018-06-13: qty 20

## 2018-06-13 MED ORDER — DEXAMETHASONE SODIUM PHOSPHATE 10 MG/ML IJ SOLN
20.0000 mg | Freq: Once | INTRAMUSCULAR | Status: AC
  Administered 2018-06-13: 10 mg
  Filled 2018-06-13: qty 2

## 2018-06-13 NOTE — Progress Notes (Signed)
Patient's Name: Elizabeth Bowers  MRN: [id number]  Referring Provider: Milinda Pointer, MD  DOB: Sep 07, 1965  PCP: Virginia Crews, MD  DOS: 06/13/2018  Note by: Gaspar Cola, MD  Service setting: Ambulatory outpatient  Specialty: Interventional Pain Management  Patient type: Established  Location: [facility] (AMB) Pain Management Facility  Visit type: Interventional Procedure   Primary Reason for Visit: Interventional Pain Management Treatment. CC: Procedure (bilateral facet block ) and Neck Pain  Procedure:          Anesthesia, Analgesia, Anxiolysis:  Type: Cervical Facet Medial Branch Block(s)  #2  Primary Purpose: Diagnostic Region: Posterolateral cervical spine Level: C3, C4, C5, C6, & C7 Medial Branch Level(s). Injecting these levels blocks the C3-4, C4-5, C5-6, and C6-7 cervical facet joints. Laterality: Bilateral  Type: Moderate (Conscious) Sedation combined with Local Anesthesia Indication(s): Analgesia and Anxiety Route: Intravenous (IV) IV Access: Secured Sedation: Meaningful verbal contact was maintained at all times during the procedure  Local Anesthetic: Lidocaine 1-2%  Position: Prone with head of the table raised to facilitate breathing.   Indications: 1. Cervicalgia (Bilateral)   2. Cervical facet syndrome (Bilateral)   3. Spondylosis without myelopathy or radiculopathy, cervical region   4. Cervical Grade 1 (2 mm) Retrolisthesis C5 over C6   5. Cervicogenic headache (Bilateral)   6. DDD (degenerative disc disease), cervical    Pain Score: Pre-procedure: 6 /10 Post-procedure: 0-No pain/10  Pre-op Assessment:  Elizabeth Bowers is a 53 y.o. (year old), female patient, seen today for interventional treatment. She  has a past surgical history that includes Elbow Debridement; Foot surgery; Cholecystectomy; Colonoscopy; Abdominal hysterectomy; and Esophagogastroduodenoscopy (egd) with propofol (N/A, 02/06/2018). Elizabeth Bowers has a current medication list which includes the  following prescription(s): docusate sodium, duloxetine, lansoprazole, linzess, magnesium oxide, melatonin, multivitamin with minerals, triamterene-hydrochlorothiazide, vitamin c, and potassium chloride sa, and the following Facility-Administered Medications: fentanyl, lactated ringers, and midazolam. Her primarily concern today is the Procedure (bilateral facet block ) and Neck Pain  Initial Vital Signs:  Pulse/HCG Rate: 65ECG Heart Rate: 69 Temp: 98.2 F (36.8 C) Resp: 18 BP: 136/76 SpO2: 99 %  BMI: Estimated body mass index is 30.91 kg/m as calculated from the following:   Height as of this encounter: 5\' 2"  (1.575 m).   Weight as of this encounter: 169 lb (76.7 kg).  Risk Assessment: Allergies: Reviewed. She is allergic to ranitidine; amphetamine-dextroamphetamine; lactose intolerance (gi); reglan [metoclopramide]; topiramate er; wheat bran; and sulfa antibiotics.  Allergy Precautions: None required Coagulopathies: Reviewed. None identified.  Blood-thinner therapy: None at this time Active Infection(s): Reviewed. None identified. Elizabeth Bowers is afebrile  Site Confirmation: Elizabeth Bowers was asked to confirm the procedure and laterality before marking the site Procedure checklist: Completed Consent: Before the procedure and under the influence of no sedative(s), amnesic(s), or anxiolytics, the patient was informed of the treatment options, risks and possible complications. To fulfill our ethical and legal obligations, as recommended by the American Medical Association's Code of Ethics, I have informed the patient of my clinical impression; the nature and purpose of the treatment or procedure; the risks, benefits, and possible complications of the intervention; the alternatives, including doing nothing; the risk(s) and benefit(s) of the alternative treatment(s) or procedure(s); and the risk(s) and benefit(s) of doing nothing. The patient was provided information about the general risks and possible  complications associated with the procedure. These may include, but are not limited to: failure to achieve desired goals, infection, bleeding, organ or nerve damage, allergic reactions, paralysis, and  death. In addition, the patient was informed of those risks and complications associated to Spine-related procedures, such as failure to decrease pain; infection (i.e.: Meningitis, epidural or intraspinal abscess); bleeding (i.e.: epidural hematoma, subarachnoid hemorrhage, or any other type of intraspinal or peri-dural bleeding); organ or nerve damage (i.e.: Any type of peripheral nerve, nerve root, or spinal cord injury) with subsequent damage to sensory, motor, and/or autonomic systems, resulting in permanent pain, numbness, and/or weakness of one or several areas of the body; allergic reactions; (i.e.: anaphylactic reaction); and/or death. Furthermore, the patient was informed of those risks and complications associated with the medications. These include, but are not limited to: allergic reactions (i.e.: anaphylactic or anaphylactoid reaction(s)); adrenal axis suppression; blood sugar elevation that in diabetics may result in ketoacidosis or comma; water retention that in patients with history of congestive heart failure may result in shortness of breath, pulmonary edema, and decompensation with resultant heart failure; weight gain; swelling or edema; medication-induced neural toxicity; particulate matter embolism and blood vessel occlusion with resultant organ, and/or nervous system infarction; and/or aseptic necrosis of one or more joints. Finally, the patient was informed that Medicine is not an exact science; therefore, there is also the possibility of unforeseen or unpredictable risks and/or possible complications that may result in a catastrophic outcome. The patient indicated having understood very clearly. We have given the patient no guarantees and we have made no promises. Enough time was given to the  patient to ask questions, all of which were answered to the patient's satisfaction. Elizabeth Bowers has indicated that she wanted to continue with the procedure. Attestation: I, the ordering provider, attest that I have discussed with the patient the benefits, risks, side-effects, alternatives, likelihood of achieving goals, and potential problems during recovery for the procedure that I have provided informed consent. Date  Time: 06/13/2018  8:30 AM  Pre-Procedure Preparation:  Monitoring: As per clinic protocol. Respiration, ETCO2, SpO2, BP, heart rate and rhythm monitor placed and checked for adequate function Safety Precautions: Patient was assessed for positional comfort and pressure points before starting the procedure. Time-out: I initiated and conducted the "Time-out" before starting the procedure, as per protocol. The patient was asked to participate by confirming the accuracy of the "Time Out" information. Verification of the correct person, site, and procedure were performed and confirmed by me, the nursing staff, and the patient. "Time-out" conducted as per Joint Commission's Universal Protocol (UP.01.01.01). Time: 0918  Description of Procedure:          Laterality: Bilateral. The procedure was performed in identical fashion on both sides. Level: C3, C4, C5, C6, & C7 Medial Branch Level(s). Area Prepped: Posterior Cervico-thoracic Region Prepping solution: ChloraPrep (2% chlorhexidine gluconate and 70% isopropyl alcohol) Safety Precautions: Aspiration looking for blood return was conducted prior to all injections. At no point did we inject any substances, as a needle was being advanced. Before injecting, the patient was told to immediately notify me if she was experiencing any new onset of "ringing in the ears, or metallic taste in the mouth". No attempts were made at seeking any paresthesias. Safe injection practices and needle disposal techniques used. Medications properly checked for  expiration dates. SDV (single dose vial) medications used. After the completion of the procedure, all disposable equipment used was discarded in the proper designated medical waste containers. Local Anesthesia: Protocol guidelines were followed. The patient was positioned over the fluoroscopy table. The area was prepped in the usual manner. The time-out was completed. The target area was identified  using fluoroscopy. A 12-in long, straight, sterile hemostat was used with fluoroscopic guidance to locate the targets for each level blocked. Once located, the skin was marked with an approved surgical skin marker. Once all sites were marked, the skin (epidermis, dermis, and hypodermis), as well as deeper tissues (fat, connective tissue and muscle) were infiltrated with a small amount of a short-acting local anesthetic, loaded on a 10cc syringe with a 25G, 1.5-in  Needle. An appropriate amount of time was allowed for local anesthetics to take effect before proceeding to the next step. Local Anesthetic: Lidocaine 2.0% The unused portion of the local anesthetic was discarded in the proper designated containers. Technical explanation of process:  C3 Medial Branch Nerve Block (MBB): The target area for the C3 dorsal medial articular branch is the lateral concave waist of the articular pillar of C3. Under fluoroscopic guidance, a Quincke needle was inserted until contact was made with os over the postero-lateral aspect of the articular pillar of C3 (target area). After negative aspiration for blood, 0.5 mL of the nerve block solution was injected without difficulty or complication. The needle was removed intact. C4 Medial Branch Nerve Block (MBB): The target area for the C4 dorsal medial articular branch is the lateral concave waist of the articular pillar of C4. Under fluoroscopic guidance, a Quincke needle was inserted until contact was made with os over the postero-lateral aspect of the articular pillar of C4 (target  area). After negative aspiration for blood, 0.5 mL of the nerve block solution was injected without difficulty or complication. The needle was removed intact. C5 Medial Branch Nerve Block (MBB): The target area for the C5 dorsal medial articular branch is the lateral concave waist of the articular pillar of C5. Under fluoroscopic guidance, a Quincke needle was inserted until contact was made with os over the postero-lateral aspect of the articular pillar of C5 (target area). After negative aspiration for blood, 0.5 mL of the nerve block solution was injected without difficulty or complication. The needle was removed intact. C6 Medial Branch Nerve Block (MBB): The target area for the C6 dorsal medial articular branch is the lateral concave waist of the articular pillar of C6. Under fluoroscopic guidance, a Quincke needle was inserted until contact was made with os over the postero-lateral aspect of the articular pillar of C6 (target area). After negative aspiration for blood, 0.5 mL of the nerve block solution was injected without difficulty or complication. The needle was removed intact. C7 Medial Branch Nerve Block (MBB): The target for the C7 dorsal medial articular branch lies on the superior-medial tip of the C7 transverse process. Under fluoroscopic guidance, a Quincke needle was inserted until contact was made with os over the postero-lateral aspect of the articular pillar of C7 (target area). After negative aspiration for blood, 0.5 mL of the nerve block solution was injected without difficulty or complication. The needle was removed intact. Procedural Needles: 22-gauge, 3.5-inch, Quincke needles used for all levels. Nerve block solution: 0.2% PF-Ropivacaine + Triamcinolone (40 mg/mL) diluted to a final concentration of 4 mg of Triamcinolone/mL of Ropivacaine The unused portion of the solution was discarded in the proper designated containers.  Once the entire procedure was completed, the treated area  was cleaned, making sure to leave some of the prepping solution back to take advantage of its long term bactericidal properties.  Vitals:   06/13/18 0930 06/13/18 0940 06/13/18 0951 06/13/18 1000  BP: 134/82 (!) 147/85 (!) 150/77 (!) 151/80  Pulse: 60  Resp: 17 12 14 17   Temp:      SpO2: 99% 100% 100% 99%  Weight:      Height:        Start Time: 0918 hrs. End Time: 0930 hrs.  Imaging Guidance (Spinal):          Type of Imaging Technique: Fluoroscopy Guidance (Spinal) Indication(s): Assistance in needle guidance and placement for procedures requiring needle placement in or near specific anatomical locations not easily accessible without such assistance. Exposure Time: Please see nurses notes. Contrast: None used. Fluoroscopic Guidance: I was personally present during the use of fluoroscopy. "Tunnel Vision Technique" used to obtain the best possible view of the target area. Parallax error corrected before commencing the procedure. "Direction-depth-direction" technique used to introduce the needle under continuous pulsed fluoroscopy. Once target was reached, antero-posterior, oblique, and lateral fluoroscopic projection used confirm needle placement in all planes. Images permanently stored in EMR. Interpretation: No contrast injected. I personally interpreted the imaging intraoperatively. Adequate needle placement confirmed in multiple planes. Permanent images saved into the patient's record.  Antibiotic Prophylaxis:   Anti-infectives (From admission, onward)   None     Indication(s): None identified  Post-operative Assessment:  Post-procedure Vital Signs:  Pulse/HCG Rate: 6063 Temp: 98.2 F (36.8 C) Resp: 17 BP: (!) 151/80 SpO2: 99 %  EBL: None  Complications: No immediate post-treatment complications observed by team, or reported by patient.  Note: The patient tolerated the entire procedure well. A repeat set of vitals were taken after the procedure and the patient was  kept under observation following institutional policy, for this type of procedure. Post-procedural neurological assessment was performed, showing return to baseline, prior to discharge. The patient was provided with post-procedure discharge instructions, including a section on how to identify potential problems. Should any problems arise concerning this procedure, the patient was given instructions to immediately contact us, at any time, without hesitation. In any case, we plan to contact the patient by telephone for a follow-up status report regarding this interventional procedure.  Comments:  No additional relevant information.  Plan of Care    Imaging Orders     DG C-Arm 1-60 Min-No Report  Procedure Orders     CERVICAL FACET (MEDIAL BRANCH NERVE BLOCK)   Medications ordered for procedure: Meds ordered this encounter  Medications  . lidocaine (XYLOCAINE) 2 % (with pres) injection 400 mg  . midazolam (VERSED) 5 MG/5ML injection 1-2 mg    Make sure Flumazenil is available in the pyxis when using this medication. If oversedation occurs, administer 0.2 mg IV over 15 sec. If after 45 sec no response, administer 0.2 mg again over 1 min; may repeat at 1 min intervals; not to exceed 4 doses (1 mg)  . fentaNYL (SUBLIMAZE) injection 25-50 mcg    Make sure Narcan is available in the pyxis when using this medication. In the event of respiratory depression (RR< 8/min): Titrate NARCAN (naloxone) in increments of 0.1 to 0.2 mg IV at 2-3 minute intervals, until desired degree of reversal.  . lactated ringers infusion 1,000 mL  . ropivacaine (PF) 2 mg/mL (0.2%) (NAROPIN) injection 18 mL  . dexamethasone (DECADRON) injection 20 mg   Medications administered: We administered lidocaine, midazolam, fentaNYL, lactated ringers, ropivacaine (PF) 2 mg/mL (0.2%), and dexamethasone.  See the medical record for exact dosing, route, and time of administration.  Disposition: Discharge home  Discharge Date &  Time: 06/13/2018; 1000 hrs.   Physician-requested Follow-up: Return for post-procedure eval (2 wks), w/ Dr. Dossie Arbour.  Future Appointments  Date Time Provider [place]  06/26/2018  9:15 AM Milinda Pointer, MD [facility] None  08/21/2018  8:40 AM Bacigalupo, [name], MD [facility] None   Primary Care Physician: Virginia Crews, MD Location: [facility] Note by: Gaspar Cola, MD Date: 06/13/2018; Time: 10:02 AM  Disclaimer:  Medicine is not an Chief Strategy Officer. The only guarantee in medicine is that nothing is guaranteed. It is important to note that the decision to proceed with this intervention was based on the information collected from the patient. The Data and conclusions were drawn from the patient's questionnaire, the interview, and the physical examination. Because the information was provided in large part by the patient, it cannot be guaranteed that it has not been purposely or unconsciously manipulated. Every effort has been made to obtain as much relevant data as possible for this evaluation. It is important to note that the conclusions that lead to this procedure are derived in large part from the available data. Always take into account that the treatment will also be dependent on availability of resources and existing treatment guidelines, considered by other Pain Management Practitioners as being common knowledge and practice, at the time of the intervention. For Medico-Legal purposes, it is also important to point out that variation in procedural techniques and pharmacological choices are the acceptable norm. The indications, contraindications, technique, and results of the above procedure should only be interpreted and judged by a Board-Certified Interventional Pain Specialist with extensive familiarity and expertise in the same exact procedure and technique.

## 2018-06-13 NOTE — Progress Notes (Signed)
Safety precautions to be maintained throughout the outpatient stay will include: orient to surroundings, keep bed in low position, maintain call bell within reach at all times, provide assistance with transfer out of bed and ambulation.  

## 2018-06-13 NOTE — Telephone Encounter (Signed)
[facility] faxed refill request for the following medications:  triamterene-hydrochlorothiazide (MAXZIDE-25) 37.5-25 MG tablet  90 day supply  Last Rx: 07/06/17  Please advise. Thanks TNP

## 2018-06-13 NOTE — Patient Instructions (Signed)

## 2018-06-14 ENCOUNTER — Telehealth: Payer: Self-pay

## 2018-06-14 NOTE — Telephone Encounter (Signed)
Post procedure phone call.  Patient states she has been up because of pain and headache.  States she did get relief until the numbing medicine wore off.  She states that she knows that it will eventually get better and this is how it usually works for her.  Instructed patient to call us for any questions or concerns.

## 2018-06-24 NOTE — Progress Notes (Addendum)
Patient's Name: Elizabeth Bowers  MRN: [id number]  Referring Provider: Virginia Crews, MD  DOB: December 07, 1965  PCP: Virginia Crews, MD  DOS: 06/26/2018  Note by: Gaspar Cola, MD  Service setting: Ambulatory outpatient  Specialty: Interventional Pain Management  Location: [facility] (AMB) Pain Management Facility    Patient type: Established   Primary Reason(s) for Visit: Encounter for post-procedure evaluation of chronic illness with mild to moderate exacerbation CC: Neck Pain  HPI  Ms. Mullinax is a 53 y.o. year old, female patient, who comes today for a post-procedure evaluation. She has Rectal bleeding; Chronic sacroiliac joint pain (Right); Hypertension; Superficial thrombophlebitis; Fatty liver; Colon polyps; Fibromyalgia; Sinusitis, chronic; Allergic rhinitis; IBS (irritable bowel syndrome); Chronic GERD; Eyelid dermatitis, allergic/contact; Anxiety; Chronic knee pain (Right); DDD (degenerative disc disease), lumbar; Family hx of colon cancer; Overweight; Chronic low back pain (Secondary Area of Pain) (Bilateral) (L>R) w/ sciatica (Bilateral); Chronic lower extremity pain (Tertiary Area of Pain) (Bilateral) (L>R); Chronic neck pain (Primary Area of Pain) (Bilateral) (midline); Chronic foot pain (Left); Chronic pain syndrome; Pharmacologic therapy; Disorder of skeletal system; Problems influencing health status; Dyspepsia; Ganglion cyst; Chronic upper extremity pain (Fourth Area of Pain) (Bilateral) (R>L); Cervical Grade 1 (2 mm) Retrolisthesis C5 over C6; Cervical foraminal stenosis (C3-4 & C5-6) (Bilateral); DDD (degenerative disc disease), cervical; Long term prescription benzodiazepine use; CKD (chronic kidney disease) stage 3, GFR 30-59 ml/min ([place]); Acid reflux disease; Spondylosis without myelopathy or radiculopathy, cervical region; Chronic hip pain (Bilateral) (L>R); Lumbar facet syndrome (Bilateral) (R>L); Spondylosis without myelopathy or radiculopathy, lumbar region; Chronic Sacroiliac  joint dysfunction (Bilateral) (R>L); Chronic Somatic dysfunction of sacroiliac joint (Bilateral) (R>L); Osteoarthritis of hip (Bilateral); Cervicogenic headache (Bilateral); Cervicalgia (Bilateral) (R>L); Cervical facet syndrome (Bilateral) (R>L); Chronic occipital neuralgia (Bilateral); Cervical spondylitis with radiculitis (HCC) (C6) (Bilateral) (R>L); and Chronic sacroiliac joint pain (Bilateral) (R>L) on their problem list. Her primarily concern today is the Neck Pain  Pain Assessment: Location: Left, Right Neck Radiating: no prolong turning of my head, pain radiaties up tomy head and across bilateral shoulder Onset: More than a month ago Duration: Chronic pain Quality: Nagging, Sharp Severity: 4 /10 (subjective, self-reported pain score)  Note: Reported level is compatible with observation.                         When using our objective Pain Scale, levels between 6 and 10/10 are said to belong in an emergency room, as it progressively worsens from a 6/10, described as severely limiting, requiring emergency care not usually available at an outpatient pain management facility. At a 6/10 level, communication becomes difficult and requires great effort. Assistance to reach the emergency department may be required. Facial flushing and profuse sweating along with potentially dangerous increases in heart rate and blood pressure will be evident. Effect on ADL: pain makes it hard for me to think, limits my daily activities, get light head when turning my head quick Timing: Constant Modifying factors: ice and heat, procedures at times BP: 135/79  HR: 64  Ms. Picchi comes in today for post-procedure evaluation.  The patient had excellent relief of the pain for the duration of local anesthetic confirming that the cervicalgia and headaches originate at the level of the cervical facets.  Unfortunately, what sustains her pain is not an inflammatory process but more of a mechanical one.  Because of this, we  will need to proceed with radiofrequency ablation of the cervical facets.  However, as I went  over the results of her diagnostic injection today, I was clear to me that there is something else going on.  She has significant decreased range of motion of the cervical spine with symptoms that may be radicular.  Previously the patient had not described those as radicular and she dismissed them because of a prior history of ulnar nerve problems for which she underwent release surgery at the level of the elbows, but did not improve her symptoms.  Today she also mentioned that the surgeon, at that time, told her that he did not find any significant compression of the nerve in the elbow.  This would argue against the extremity pain is coming from that peripheral neuropathy.  However, she indicates that while she lived in [place], she did have some nerve conduction studies that at the time suggested that it was a peripheral neuropathy.  In any case, today she describes pain, numbness, and weakness and what appears to be the distribution of C5 and C6, bilaterally.  The patient has x-rays of the cervical spine with significant pathology, but she indicates never having had an MRI of the cervical spine.  Today we will be ordering this as well as a repeat bilateral upper extremity nerve conduction test by a neurologist, to determine if we are in fact dealing with a peripheral neuropathy or a radiculopathy.  Because of my strong suspicion that this may have a radicular component to it, I will be scheduling the patient to come in for a diagnostic right-sided cervical epidural steroid injection under fluoroscopic guidance and IV sedation.  Meanwhile, because she did so well with the diagnostic cervical facet blocks, especially for her cervicogenic headaches, I will be scheduling her to come back for a bilateral cervical facet radiofrequency ablation under fluoroscopic guidance and IV sedation, starting with the right side.    Because she also continues to have problems with her lower back, eventually I will follow-up with her second diagnostic bilateral lumbar facet block under fluoroscopic guidance and IV sedation.  Since her first diagnostic injection, her back pain moved from being her worst pain to be in her second or third worst pain.  They have patient complains of pain in the area of the left hip.  Physical exam today proved to be positive for bilateral sacroiliac joint pain with the right side being worse than the left and bilateral hip joint pain with the left being worse than the right.  Further details on both, my assessment(s), as well as the proposed treatment plan, please see below.  Post-Procedure Assessment  06/13/2018 Procedure: Diagnostic bilateral cervical facet block #2 under fluoroscopic guidance and IV sedation Pre-procedure pain score:  6/10 Post-procedure pain score: 0/10 (100% relief) Influential Factors: BMI: 29.26 kg/m Intra-procedural challenges: None observed.         Assessment challenges: None detected.              Reported side-effects: None.        Post-procedural adverse reactions or complications: None reported         Sedation: Sedation provided. When no sedatives are used, the analgesic levels obtained are directly associated to the effectiveness of the local anesthetics. However, when sedation is provided, the level of analgesia obtained during the initial 1 hour following the intervention, is believed to be the result of a combination of factors. These factors may include, but are not limited to: 1. The effectiveness of the local anesthetics used. 2. The effects of the analgesic(s) and/or anxiolytic(s) used.  3. The degree of discomfort experienced by the patient at the time of the procedure. 4. The patients ability and reliability in recalling and recording the events. 5. The presence and influence of possible secondary gains and/or psychosocial factors. Reported result:  Relief experienced during the 1st hour after the procedure: 100 % (Ultra-Short Term Relief) Ms. Fulp has indicated area to have been numb during this time. Interpretative annotation: Clinically appropriate result. Analgesia during this period is likely to be Local Anesthetic and/or IV Sedative (Analgesic/Anxiolytic) related.          Effects of local anesthetic: The analgesic effects attained during this period are directly associated to the localized infiltration of local anesthetics and therefore cary significant diagnostic value as to the etiological location, or anatomical origin, of the pain. Expected duration of relief is directly dependent on the pharmacodynamics of the local anesthetic used. Long-acting (4-6 hours) anesthetics used.  Reported result: Relief during the next 4 to 6 hour after the procedure: 100 % (Short-Term Relief) Ms. Vittorio has indicated area to have been numb during this time. Interpretative annotation: Clinically appropriate result. Analgesia during this period is likely to be Local Anesthetic-related.          Long-term benefit: Defined as the period of time past the expected duration of local anesthetics (1 hour for short-acting and 4-6 hours for long-acting). With the possible exception of prolonged sympathetic blockade from the local anesthetics, benefits during this period are typically attributed to, or associated with, other factors such as analgesic sensory neuropraxia, antiinflammatory effects, or beneficial biochemical changes provided by agents other than the local anesthetics.  Reported result: Extended relief following procedure: 0 %(as soon as the numbness wear off the pain was back to procedure level) (Long-Term Relief)            Interpretative annotation: Clinically possible results. No long-term benefit. No long-term benefit expected. No significant inflammatory component detected. Etiology is likely mechanical rather than inflammatory.  Current benefits:  Defined as reported results that persistent at this point in time.   Analgesia: <25 % Ms. Loomis reports improvement of axial symptoms. Function: Back to baseline ROM: Back to baseline Interpretative annotation: Recurrence of symptoms. Limited therapeutic benefit. Results would suggest persistent aggravating factors.          Interpretation: Results would suggest a successful diagnostic intervention.         Ms. Duey indicates having had an unsuccessful trial of physical therapy, which she described as non-beneficial and painful.  Plan:  Proceed with Radiofrequency Ablation for the purpose of attaining long-term benefits. Please see below for the plan. "The patient has failed to respond to conservative therapies including over-the-counter medications, anti-inflammatories, muscle relaxants, membrane stabilizers, opioids, physical therapy modalities such as heat and ice, as well as more invasive techniques such as nerve blocks. Because Ms. [place] did attain more than 50% relief of the pain during a series of diagnostic blocks conducted in separate occasions, I believe it is medically necessary to proceed with Radiofrequency Ablation, in order to attempt gaining longer relief.   Medical Necessity: Ms. Nessel has experienced debilitating chronic pain from the Cervical Facet Syndrome (Spondylosis without myelopathy or radiculopathy, cervical region [M47.812]) that has persisted for longer than three months of failed non-surgical care and has either failed to respond, or was unable to tolerate, or simply did not get enough benefit from other more conservative therapies including, but not limited to: 1. Over-the-counter oral analgesic medications (i.e.: ibuprofen, naproxen, etc.) 2. Anti-inflammatory medications  3. Muscle relaxants 4. Membrane stabilizers 5. Opioids 6. Physical therapy (PT), chiropractic manipulation, and/or home exercise program (HEP). 7. Modalities (Heat, ice, etc.) 8. Invasive techniques  such as nerve blocks.  Ms. Kizziah has attained greater than 50% reduction in pain from at least two (2) diagnostic medial branch blocks conducted in separate occasions. For this reason, I believe it is medically necessary to proceed with Non-Pulsed Radiofrequency Ablation for the purpose of attempting to prolong the duration of the benefits seen with the diagnostic injections.  Laboratory Chemistry  Inflammation Markers (CRP: Acute Phase) (ESR: Chronic Phase) Lab Results  Component Value Date   CRP 8 01/16/2018   ESRSEDRATE 16 01/16/2018                         Rheumatology Markers No results found.  Renal Markers Lab Results  Component Value Date   BUN 11 01/16/2018   CREATININE 1.22 (H) 01/16/2018   BCR 9 01/16/2018   GFRAA 58 (L) 01/16/2018   GFRNONAA 51 (L) 01/16/2018                             Hepatic Markers Lab Results  Component Value Date   AST 21 01/16/2018   ALT 24 11/26/2017   ALBUMIN 4.5 01/16/2018                        Neuropathy Markers Lab Results  Component Value Date   VITAMINB12 1,137 01/16/2018   HGBA1C 5.3 12/13/2017   HIV Non Reactive 12/16/2016                        Hematology Parameters Lab Results  Component Value Date   PLT 303 11/26/2017   HGB 14.6 11/26/2017   HCT 42.6 11/26/2017                        CV Markers No results found.  Note: Lab results reviewed.  Recent Imaging Results   Results for orders placed in visit on 06/13/18  DG C-Arm 1-60 Min-No Report   Narrative Fluoroscopy was utilized by the requesting physician.  No radiographic  interpretation.    Interpretation Report: Fluoroscopy was used during the procedure to assist with needle guidance. The images were interpreted intraoperatively by the requesting physician.  Meds   Current Outpatient Medications:  .  docusate sodium (COLACE) 100 MG capsule, Take 200 mg by mouth at bedtime. , Disp: , Rfl:  .  DULoxetine (CYMBALTA) 60 MG capsule, TAKE 1 CAPSULE (60 MG  TOTAL) BY MOUTH DAILY., Disp: 30 capsule, Rfl: 11 .  lansoprazole (PREVACID) 30 MG capsule, TAKE 1 CAPSULE BY MOUTH TWICE DAILY, Disp: 60 capsule, Rfl: 1 .  LINZESS 290 MCG CAPS capsule, Take 1 capsule (290 mcg total) by mouth daily., Disp: 90 capsule, Rfl: 3 .  Multiple Vitamin (MULTIVITAMIN WITH MINERALS) TABS tablet, Take 1 tablet by mouth daily., Disp: , Rfl:  .  triamterene-hydrochlorothiazide (MAXZIDE-25) 37.5-25 MG tablet, Take 1 tablet by mouth daily., Disp: 90 tablet, Rfl: 3 .  vitamin C (ASCORBIC ACID) 500 MG tablet, Take 500 mg by mouth daily., Disp: , Rfl:  .  Magnesium Oxide 500 MG CAPS, Take 1 capsule (500 mg total) by mouth at bedtime as needed and may repeat dose one time if needed., Disp: 90 capsule, Rfl: 0 .  potassium  chloride SA (K-DUR,KLOR-CON) 20 MEQ tablet, Take 1 tablet (20 mEq total) by mouth daily., Disp: 30 tablet, Rfl: 5  ROS  Constitutional: Denies any fever or chills Gastrointestinal: No reported hemesis, hematochezia, vomiting, or acute GI distress Musculoskeletal: Denies any acute onset joint swelling, redness, loss of ROM, or weakness Neurological: No reported episodes of acute onset apraxia, aphasia, dysarthria, agnosia, amnesia, paralysis, loss of coordination, or loss of consciousness  Allergies  Ms. Goodreau is allergic to ranitidine; amphetamine-dextroamphetamine; lactose intolerance (gi); reglan [metoclopramide]; topiramate er; wheat bran; and sulfa antibiotics.  [place]  Drug: Ms. Mitten  reports no history of drug use. Alcohol:  reports current alcohol use of about 2.0 - 4.0 standard drinks of alcohol per week. Tobacco:  reports that she quit smoking about 30 years ago. Her smoking use included cigarettes. She has a 3.75 pack-year smoking history. She has never used smokeless tobacco. Medical:  has a past medical history of Fatty liver, Fibromyalgia, GERD (gastroesophageal reflux disease), Hemorrhoids, Hot flashes, Hypertension, IBS (irritable bowel syndrome),  LUQ abdominal pain (10/25/2016), Migraines, Miscarriage, Multilevel degenerative disc disease, and Vocal cord dysfunction. Surgical: Ms. Stutsman  has a past surgical history that includes Elbow Debridement; Foot surgery; Cholecystectomy; Colonoscopy; Abdominal hysterectomy; and Esophagogastroduodenoscopy (egd) with propofol (N/A, 02/06/2018). Family: family history includes Alcohol abuse in her father and son; Arthritis in her son and son; Asthma in her sister; COPD in her father, paternal grandmother, and sister; Cancer in her paternal grandfather and sister; Colon cancer in her sister; Colon polyps in her sister; Depression in her son; Diabetes in her maternal grandmother, mother, and son; Drug abuse in her son; Glaucoma in her mother; Hyperlipidemia in her mother; Hypertension in her mother; Kidney disease in her sister; Mental illness in her sister and son; Miscarriages / Stillbirths in her sister and sister; Uterine cancer in her sister.  Constitutional Exam  General appearance: Well nourished, well developed, and well hydrated. In no apparent acute distress Vitals:   06/26/18 0937  BP: 135/79  Pulse: 64  Temp: 98 F (36.7 C)  SpO2: 100%  Weight: 160 lb (72.6 kg)  Height: 5' 2" (1.575 m)   BMI Assessment: Estimated body mass index is 29.26 kg/m as calculated from the following:   Height as of this encounter: 5' 2" (1.575 m).   Weight as of this encounter: 160 lb (72.6 kg).  BMI interpretation table: BMI level Category Range association with higher incidence of chronic pain  <18 kg/m2 Underweight   18.5-24.9 kg/m2 Ideal body weight   25-29.9 kg/m2 Overweight Increased incidence by 20%  30-34.9 kg/m2 Obese (Class I) Increased incidence by 68%  35-39.9 kg/m2 Severe obesity (Class II) Increased incidence by 136%  >40 kg/m2 Extreme obesity (Class III) Increased incidence by 254%   Patient's current BMI Ideal Body weight  Body mass index is 29.26 kg/m. Ideal body weight: 50.1 kg (110 lb 7.2  oz) Adjusted ideal body weight: 59.1 kg (130 lb 4.3 oz)   BMI Readings from Last 4 Encounters:  06/26/18 29.26 kg/m  06/13/18 30.91 kg/m  05/20/18 28.35 kg/m  05/15/18 28.94 kg/m   Wt Readings from Last 4 Encounters:  06/26/18 160 lb (72.6 kg)  06/13/18 169 lb (76.7 kg)  05/20/18 155 lb (70.3 kg)  05/15/18 158 lb 3.2 oz (71.8 kg)  Psych/Mental status: Alert, oriented x 3 (person, place, & time)       Eyes: PERLA Respiratory: No evidence of acute respiratory distress  Cervical Spine Area Exam  Skin & Axial  Inspection: No masses, redness, edema, swelling, or associated skin lesions Alignment: Symmetrical Functional ROM: Minimal ROM, bilaterally Stability: No instability detected Muscle Tone/Strength: Guarding observed Sensory (Neurological): Movement-associated pain Palpation: Complains of area being tender to palpation              Upper Extremity (UE) Exam    Side: Right upper extremity  Side: Left upper extremity  Skin & Extremity Inspection: Skin color, temperature, and hair growth are WNL. No peripheral edema or cyanosis. No masses, redness, swelling, asymmetry, or associated skin lesions. No contractures.  Skin & Extremity Inspection: Skin color, temperature, and hair growth are WNL. No peripheral edema or cyanosis. No masses, redness, swelling, asymmetry, or associated skin lesions. No contractures.  Functional ROM: Unrestricted ROM          Functional ROM: Unrestricted ROM          Muscle Tone/Strength: Functionally intact. No obvious neuro-muscular anomalies detected.  Muscle Tone/Strength: Functionally intact. No obvious neuro-muscular anomalies detected.  Sensory (Neurological): Dermatomal pain pattern          Sensory (Neurological): Dermatomal pain pattern          Palpation: No palpable anomalies              Palpation: No palpable anomalies              Provocative Test(s):  Phalen's test: deferred Tinel's test: deferred Apley's scratch test (touch opposite  shoulder):  Action 1 (Across chest): deferred Action 2 (Overhead): deferred Action 3 (LB reach): deferred   Provocative Test(s):  Phalen's test: deferred Tinel's test: deferred Apley's scratch test (touch opposite shoulder):  Action 1 (Across chest): deferred Action 2 (Overhead): deferred Action 3 (LB reach): deferred    Thoracic Spine Area Exam  Skin & Axial Inspection: No masses, redness, or swelling Alignment: Symmetrical Functional ROM: Unrestricted ROM Stability: No instability detected Muscle Tone/Strength: Functionally intact. No obvious neuro-muscular anomalies detected. Sensory (Neurological): Unimpaired Muscle strength & Tone: No palpable anomalies  Lumbar Spine Area Exam  Skin & Axial Inspection: No masses, redness, or swelling Alignment: Symmetrical Functional ROM: Decreased ROM       Stability: No instability detected Muscle Tone/Strength: Functionally intact. No obvious neuro-muscular anomalies detected. Sensory (Neurological): Unimpaired Palpation: No palpable anomalies       Provocative Tests: Hyperextension/rotation test: (+) bilaterally for facet joint pain. Lumbar quadrant test (Kemp's test): deferred today       Lateral bending test: deferred today       Patrick's Maneuver: (+) for bilateral S-I arthralgia and for bilateral hip arthralgia FABER* test: (+) for bilateral S-I arthralgia and for bilateral hip arthralgia S-I anterior distraction/compression test: deferred today         S-I lateral compression test: deferred today         S-I Thigh-thrust test: deferred today         S-I Gaenslen's test: deferred today         *(Flexion, ABduction and External Rotation)  Gait & Posture Assessment  Ambulation: Unassisted Gait: Relatively normal for age and body habitus Posture: Antalgic   Lower Extremity Exam    Side: Right lower extremity  Side: Left lower extremity  Stability: No instability observed          Stability: No instability observed           Skin & Extremity Inspection: Skin color, temperature, and hair growth are WNL. No peripheral edema or cyanosis. No masses, redness, swelling, asymmetry, or associated skin  lesions. No contractures.  Skin & Extremity Inspection: Skin color, temperature, and hair growth are WNL. No peripheral edema or cyanosis. No masses, redness, swelling, asymmetry, or associated skin lesions. No contractures.  Functional ROM: Unrestricted ROM                  Functional ROM: Unrestricted ROM                  Muscle Tone/Strength: Functionally intact. No obvious neuro-muscular anomalies detected.  Muscle Tone/Strength: Functionally intact. No obvious neuro-muscular anomalies detected.  Sensory (Neurological): Unimpaired        Sensory (Neurological): Unimpaired        DTR: Patellar: deferred today Achilles: deferred today Plantar: deferred today  DTR: Patellar: deferred today Achilles: deferred today Plantar: deferred today  Palpation: No palpable anomalies  Palpation: No palpable anomalies   Assessment  Primary Diagnosis & Pertinent Problem List: The primary encounter diagnosis was Cervicalgia (Bilateral). Diagnoses of Cervical facet syndrome (Bilateral), Cervical Grade 1 (2 mm) Retrolisthesis C5 over C6, Cervical foraminal stenosis (C3-4 & C5-6) (Bilateral), Cervical spondylitis with radiculitis (HCC) (C6) (Bilateral) (R>L), DDD (degenerative disc disease), cervical, Chronic upper extremity pain (Fourth Area of Pain) (Bilateral) (R>L), Chronic neck pain (Tertiary Area of Pain) (midline), Chronic low back pain (Primary Area of Pain) (Bilateral) (L>R) w/ sciatica (Bilateral), DDD (degenerative disc disease), lumbar, Chronic sacroiliac joint pain (Bilateral) (R>L), Lumbar facet syndrome (Bilateral) (R>L), Chronic lower extremity pain (Secondary Area of Pain) (Bilateral) (L>R), and CKD (chronic kidney disease) stage 3, GFR 30-59 ml/min (HCC) were also pertinent to this visit.  Status Diagnosis   Unimproved Worsening Stable 1. Cervicalgia (Bilateral)   2. Cervical facet syndrome (Bilateral)   3. Cervical Grade 1 (2 mm) Retrolisthesis C5 over C6   4. 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 Cervical spondylitis with radiculitis (HCC) (C6) (Bilateral) (R>L)   6. DDD (degenerative disc disease), cervical   7. Chronic upper extremity pain (Fourth Area of Pain) (Bilateral) (R>L)   8. Chronic neck pain (Tertiary Area of Pain) (midline)   9. Chronic low back pain (Primary Area of Pain) (Bilateral) (L>R) w/ sciatica (Bilateral)   10. DDD (degenerative disc disease), lumbar   11. Chronic sacroiliac joint pain (Bilateral) (R>L)   12. Lumbar facet syndrome (Bilateral) (R>L)   13. Chronic lower extremity pain (Secondary Area of Pain) (Bilateral) (L>R)   14. CKD (chronic kidney disease) stage 3, GFR 30-59 ml/min (HCC)     Problems updated and reviewed during this visit: Problem  Cervical spondylitis with radiculitis (HCC) (C6) (Bilateral) (R>L)  Chronic sacroiliac joint pain (Bilateral) (R>L)  Chronic Sacroiliac joint dysfunction (Bilateral) (R>L)  Chronic Somatic dysfunction of sacroiliac joint (Bilateral) (R>L)  Cervicalgia (Bilateral) (R>L)  Cervical facet syndrome (Bilateral) (R>L)  Chronic low back pain (Secondary Area of Pain) (Bilateral) (L>R) w/ sciatica (Bilateral)  Chronic lower extremity pain (Tertiary Area of Pain) (Bilateral) (L>R)   The patient pain pattern follows that of "referred pain" from the lumbar facets.   Chronic neck pain (Primary Area of Pain) (Bilateral) (midline)  Luq Abdominal Pain (Resolved)   Plan of Care  Pharmacotherapy (Medications Ordered): No orders of the defined types were placed in this encounter.  Medications administered today: Velvie L. Heatherly had no medications administered during this visit.   Procedure Orders     Cervical Epidural Injection     Radiofrequency,Cervical     LUMBAR FACET(MEDIAL BRANCH NERVE BLOCK)  [facility] Lab Orders  No laboratory test(s) ordered  today    Imaging Orders     MR CERVICAL SPINE WO CONTRAST Referral Orders  No referral(s) requested today   Interventional management options: Planned, scheduled, and/or pending:   Diagnostic right-sided cervical ESI #1 under fluoroscopic guidance and IV sedation (We will do this 1 first while we await for approval on the RFA) Therapeutic right-sided cervical facet RFA #1 under fluoroscopic guidance and IV sedation.  Once this is completed, we will follow with the left side, 2 weeks later. Today we have ordered an MRI of the cervical spine. The patient presents with symptoms compatible with a possible bilateral C6 radiculitis. Because the patient has a prior history of an ulnar entrapment and release, she was assuming that all of the problems in the hands were secondary to that.  Today we have clarified that this may not be the case. To further clarify whether we are dealing with a peripheral neuropathy versus a radiculopathy of the cervical spine, today I will be ordering nerve conduction test (EMG/PNCV) of both upper extremities since the patient indicates having pain, numbness, and weakness of the upper extremities. Once we get the cervical problems under control, then we will turn our attention to her lumbar spine.  She indicates that she is beginning to experience some more pain in the lower back, but the neck is still much worse. In addition, today she presented with evidence of bilateral sacroiliac joint pain and bilateral hip pain, that we will address in the near future.   Considering:   Possible bilateral cervical facet RFA #1 Diagnostic bilateral lumbar facet block #2  Possible bilateral lumbar facet RFA #1 Diagnostic right-sided sacroiliac joint block Possible right-sided sacroiliac joint RFA Diagnostic bilateral intra-articular hip joint injection with local anesthetic and steroid Diagnostic bilateral femoral nerve block +  obturator nerve block Possible bilateral femoral nerve + obturator nerve RFA Diagnostic bilateral L5 transforaminal ESI Diagnostic bilateral intra-articular knee joint injection with local anesthetic and steroid Therapeutic series of 5 intra-articular bilateral Hyalgan knee injections Diagnostic bilateral genicular nerve block Possible bilateral genicular nerve RFA   Palliative PRN treatment(s):   Palliative bilateral cervical facet blocks Diagnostic bilaterallumbarfacet block #2    Provider-requested follow-up: Return for Procedure (w/ sedation): (R) CESI #1.  Future Appointments  Date Time Provider [place]  06/27/2018  2:00 PM Milinda Pointer, MD [facility] None  08/21/2018  8:40 AM Bacigalupo, [name], MD [facility] None   Primary Care Physician: Virginia Crews, MD Location: [facility] Note by: Gaspar Cola, MD Date: 06/26/2018; Time: 11:06 AM

## 2018-06-26 ENCOUNTER — Ambulatory Visit: Payer: 59 | Attending: Pain Medicine | Admitting: Pain Medicine

## 2018-06-26 ENCOUNTER — Other Ambulatory Visit: Payer: Self-pay

## 2018-06-26 ENCOUNTER — Encounter: Payer: Self-pay | Admitting: Pain Medicine

## 2018-06-26 VITALS — BP 135/79 | HR 64 | Temp 98.0°F | Ht 62.0 in | Wt 160.0 lb

## 2018-06-26 DIAGNOSIS — M79602 Pain in left arm: Secondary | ICD-10-CM | POA: Insufficient documentation

## 2018-06-26 DIAGNOSIS — M542 Cervicalgia: Secondary | ICD-10-CM | POA: Diagnosis not present

## 2018-06-26 DIAGNOSIS — M47812 Spondylosis without myelopathy or radiculopathy, cervical region: Secondary | ICD-10-CM | POA: Diagnosis not present

## 2018-06-26 DIAGNOSIS — M79601 Pain in right arm: Secondary | ICD-10-CM | POA: Insufficient documentation

## 2018-06-26 DIAGNOSIS — M5442 Lumbago with sciatica, left side: Secondary | ICD-10-CM | POA: Insufficient documentation

## 2018-06-26 DIAGNOSIS — M5412 Radiculopathy, cervical region: Secondary | ICD-10-CM | POA: Insufficient documentation

## 2018-06-26 DIAGNOSIS — M5030 Other cervical disc degeneration, unspecified cervical region: Secondary | ICD-10-CM

## 2018-06-26 DIAGNOSIS — M4692 Unspecified inflammatory spondylopathy, cervical region: Secondary | ICD-10-CM | POA: Diagnosis not present

## 2018-06-26 DIAGNOSIS — M5441 Lumbago with sciatica, right side: Secondary | ICD-10-CM | POA: Diagnosis present

## 2018-06-26 DIAGNOSIS — N183 Chronic kidney disease, stage 3 unspecified: Secondary | ICD-10-CM

## 2018-06-26 DIAGNOSIS — M4310 Spondylolisthesis, site unspecified: Secondary | ICD-10-CM | POA: Diagnosis not present

## 2018-06-26 DIAGNOSIS — G8929 Other chronic pain: Secondary | ICD-10-CM

## 2018-06-26 DIAGNOSIS — M79604 Pain in right leg: Secondary | ICD-10-CM | POA: Insufficient documentation

## 2018-06-26 DIAGNOSIS — M47816 Spondylosis without myelopathy or radiculopathy, lumbar region: Secondary | ICD-10-CM | POA: Diagnosis present

## 2018-06-26 DIAGNOSIS — M5136 Other intervertebral disc degeneration, lumbar region: Secondary | ICD-10-CM | POA: Insufficient documentation

## 2018-06-26 DIAGNOSIS — M51369 Other intervertebral disc degeneration, lumbar region without mention of lumbar back pain or lower extremity pain: Secondary | ICD-10-CM

## 2018-06-26 DIAGNOSIS — M79605 Pain in left leg: Secondary | ICD-10-CM | POA: Diagnosis present

## 2018-06-26 DIAGNOSIS — M4802 Spinal stenosis, cervical region: Secondary | ICD-10-CM

## 2018-06-26 DIAGNOSIS — M533 Sacrococcygeal disorders, not elsewhere classified: Secondary | ICD-10-CM | POA: Diagnosis present

## 2018-06-26 NOTE — Patient Instructions (Signed)
____________________________________________________________________________________________  Preparing for Procedure with Sedation  Instructions: . Oral Intake: Do not eat or drink anything for at least 8 hours prior to your procedure. . Transportation: Public transportation is not allowed. Bring an adult driver. The driver must be physically present in our waiting room before any procedure can be started. . Physical Assistance: Bring an adult physically capable of assisting you, in the event you need help. This adult should keep you company at home for at least 6 hours after the procedure. . Blood Pressure Medicine: Take your blood pressure medicine with a sip of water the morning of the procedure. . Blood thinners: Notify our staff if you are taking any blood thinners. Depending on which one you take, there will be specific instructions on how and when to stop it. . Diabetics on insulin: Notify the staff so that you can be scheduled 1st case in the morning. If your diabetes requires high dose insulin, take only  of your normal insulin dose the morning of the procedure and notify the staff that you have done so. . Preventing infections: Shower with an antibacterial soap the morning of your procedure. . Build-up your immune system: Take 1000 mg of Vitamin C with every meal (3 times a day) the day prior to your procedure. . Antibiotics: Inform the staff if you have a condition or reason that requires you to take antibiotics before dental procedures. . Pregnancy: If you are pregnant, call and cancel the procedure. . Sickness: If you have a cold, fever, or any active infections, call and cancel the procedure. . Arrival: You must be in the facility at least 30 minutes prior to your scheduled procedure. . Children: Do not bring children with you. . Dress appropriately: Bring dark clothing that you would not mind if they get stained. . Valuables: Do not bring any jewelry or valuables.  Procedure  appointments are reserved for interventional treatments only. . No Prescription Refills. . No medication changes will be discussed during procedure appointments. . No disability issues will be discussed.  Reasons to call and reschedule or cancel your procedure: (Following these recommendations will minimize the risk of a serious complication.) . Surgeries: Avoid having procedures within 2 weeks of any surgery. (Avoid for 2 weeks before or after any surgery). . Flu Shots: Avoid having procedures within 2 weeks of a flu shots or . (Avoid for 2 weeks before or after immunizations). . Barium: Avoid having a procedure within 7-10 days after having had a radiological study involving the use of radiological contrast. (Myelograms, Barium swallow or enema study). . Heart attacks: Avoid any elective procedures or surgeries for the initial 6 months after a "Myocardial Infarction" (Heart Attack). . Blood thinners: It is imperative that you stop these medications before procedures. Let us know if you if you take any blood thinner.  . Infection: Avoid procedures during or within two weeks of an infection (including chest colds or gastrointestinal problems). Symptoms associated with infections include: Localized redness, fever, chills, night sweats or profuse sweating, burning sensation when voiding, cough, congestion, stuffiness, runny nose, sore throat, diarrhea, nausea, vomiting, cold or Flu symptoms, recent or current infections. It is specially important if the infection is over the area that we intend to treat. . Heart and lung problems: Symptoms that may suggest an active cardiopulmonary problem include: cough, chest pain, breathing difficulties or shortness of breath, dizziness, ankle swelling, uncontrolled high or unusually low blood pressure, and/or palpitations. If you are experiencing any of these symptoms, cancel   your procedure and contact your primary care physician for an evaluation.  Remember:   Regular Business hours are:  Monday to Thursday 8:00 AM to 4:00 PM  Provider's Schedule: Javonnie Illescas, MD:  Procedure days: Tuesday and Thursday 7:30 AM to 4:00 PM  Bilal Lateef, MD:  Procedure days: Monday and Wednesday 7:30 AM to 4:00 PM ____________________________________________________________________________________________    

## 2018-06-27 ENCOUNTER — Other Ambulatory Visit: Payer: Self-pay

## 2018-06-27 ENCOUNTER — Ambulatory Visit (HOSPITAL_BASED_OUTPATIENT_CLINIC_OR_DEPARTMENT_OTHER): Payer: 59 | Admitting: Pain Medicine

## 2018-06-27 ENCOUNTER — Ambulatory Visit
Admission: RE | Admit: 2018-06-27 | Discharge: 2018-06-27 | Disposition: A | Discharge status: Home / Self Care | Payer: 59 | Source: Ambulatory Visit | Attending: Pain Medicine | Admitting: Pain Medicine

## 2018-06-27 ENCOUNTER — Encounter: Payer: Self-pay | Admitting: Pain Medicine

## 2018-06-27 VITALS — BP 133/78 | HR 64 | Temp 98.2°F | Resp 15 | Ht 62.0 in | Wt 159.0 lb

## 2018-06-27 DIAGNOSIS — M5412 Radiculopathy, cervical region: Secondary | ICD-10-CM | POA: Insufficient documentation

## 2018-06-27 DIAGNOSIS — M47812 Spondylosis without myelopathy or radiculopathy, cervical region: Secondary | ICD-10-CM

## 2018-06-27 DIAGNOSIS — M4310 Spondylolisthesis, site unspecified: Secondary | ICD-10-CM | POA: Insufficient documentation

## 2018-06-27 DIAGNOSIS — M5030 Other cervical disc degeneration, unspecified cervical region: Secondary | ICD-10-CM | POA: Diagnosis not present

## 2018-06-27 DIAGNOSIS — M4802 Spinal stenosis, cervical region: Secondary | ICD-10-CM | POA: Insufficient documentation

## 2018-06-27 DIAGNOSIS — R51 Headache: Secondary | ICD-10-CM | POA: Diagnosis not present

## 2018-06-27 DIAGNOSIS — M4692 Unspecified inflammatory spondylopathy, cervical region: Secondary | ICD-10-CM

## 2018-06-27 DIAGNOSIS — M542 Cervicalgia: Secondary | ICD-10-CM | POA: Diagnosis not present

## 2018-06-27 DIAGNOSIS — G4486 Cervicogenic headache: Secondary | ICD-10-CM

## 2018-06-27 MED ORDER — DEXAMETHASONE SODIUM PHOSPHATE 10 MG/ML IJ SOLN
10.0000 mg | Freq: Once | INTRAMUSCULAR | Status: AC
  Administered 2018-06-27: 10 mg
  Filled 2018-06-27: qty 1

## 2018-06-27 MED ORDER — ROPIVACAINE HCL 2 MG/ML IJ SOLN
1.0000 mL | Freq: Once | INTRAMUSCULAR | Status: AC
  Administered 2018-06-27: 1 mL via EPIDURAL
  Filled 2018-06-27: qty 10

## 2018-06-27 MED ORDER — LACTATED RINGERS IV SOLN
1000.0000 mL | Freq: Once | INTRAVENOUS | Status: AC
  Administered 2018-06-27: 1000 mL via INTRAVENOUS

## 2018-06-27 MED ORDER — MIDAZOLAM HCL 5 MG/5ML IJ SOLN
1.0000 mg | INTRAMUSCULAR | Status: DC | PRN
  Administered 2018-06-27: 2 mg via INTRAVENOUS
  Filled 2018-06-27: qty 5

## 2018-06-27 MED ORDER — IOPAMIDOL (ISOVUE-M 200) INJECTION 41%
10.0000 mL | Freq: Once | INTRAMUSCULAR | Status: AC
  Administered 2018-06-27: 10 mL via EPIDURAL
  Filled 2018-06-27: qty 10

## 2018-06-27 MED ORDER — LIDOCAINE HCL 2 % IJ SOLN
20.0000 mL | Freq: Once | INTRAMUSCULAR | Status: AC
  Administered 2018-06-27: 400 mg
  Filled 2018-06-27: qty 40

## 2018-06-27 MED ORDER — FENTANYL CITRATE (PF) 100 MCG/2ML IJ SOLN
25.0000 ug | INTRAMUSCULAR | Status: DC | PRN
  Administered 2018-06-27: 50 ug via INTRAVENOUS
  Filled 2018-06-27: qty 2

## 2018-06-27 MED ORDER — SODIUM CHLORIDE (PF) 0.9 % IJ SOLN
INTRAMUSCULAR | Status: AC
  Filled 2018-06-27: qty 10

## 2018-06-27 MED ORDER — SODIUM CHLORIDE 0.9% FLUSH
1.0000 mL | Freq: Once | INTRAVENOUS | Status: AC
  Administered 2018-06-27: 1 mL

## 2018-06-27 NOTE — Progress Notes (Signed)
Patient's Name: Elizabeth Bowers  MRN: [id number]  Referring Provider: Milinda Pointer, MD  DOB: 07-17-1965  PCP: Virginia Crews, MD  DOS: 06/27/2018  Note by: Gaspar Cola, MD  Service setting: Ambulatory outpatient  Specialty: Interventional Pain Management  Patient type: Established  Location: [facility] (AMB) Pain Management Facility  Visit type: Interventional Procedure   Primary Reason for Visit: Interventional Pain Management Treatment. CC: Neck Pain  Procedure:          Anesthesia, Analgesia, Anxiolysis:  Type: Diagnostic, Inter-Laminar, Cervical Epidural Steroid Injection  #1  Region: Posterior Cervico-thoracic Region Level: C7-T1 Laterality: Right-Sided Paramedial  Type: Moderate (Conscious) Sedation combined with Local Anesthesia Indication(s): Analgesia and Anxiety Route: Intravenous (IV) IV Access: Secured Sedation: Meaningful verbal contact was maintained at all times during the procedure  Local Anesthetic: Lidocaine 1-2%  Position: Prone with head of the table was raised to facilitate breathing.   Indications: 1. DDD (degenerative disc disease), cervical   2. Cervicalgia (Bilateral) (R>L)   3. Cervical Grade 1 (2 mm) Retrolisthesis C5 over C6   4. 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 Cervical spondylitis with radiculitis (HCC) (C6) (Bilateral) (R>L)   6. Cervicogenic headache (Bilateral)    Pain Score: Pre-procedure: 5 /10 Post-procedure: 0-No pain/10  Pre-op Assessment:  Elizabeth Bowers is a 53 y.o. (year old), female patient, seen today for interventional treatment. She  has a past surgical history that includes Elbow Debridement; Foot surgery; Cholecystectomy; Colonoscopy; Abdominal hysterectomy; and Esophagogastroduodenoscopy (egd) with propofol (N/A, 02/06/2018). Elizabeth Bowers has a current medication list which includes the following prescription(s): docusate sodium, duloxetine, lansoprazole, linzess, multivitamin with minerals,  triamterene-hydrochlorothiazide, vitamin c, magnesium oxide, and potassium chloride sa, and the following Facility-Administered Medications: fentanyl, lactated ringers, and midazolam. Her primarily concern today is the Neck Pain  Initial Vital Signs:  Pulse/HCG Rate: 64ECG Heart Rate: (!) 56 Temp: 98.3 F (36.8 C) Resp: 18 BP: 136/88 SpO2: 98 %  BMI: Estimated body mass index is 29.08 kg/m as calculated from the following:   Height as of this encounter: 5\' 2"  (1.575 m).   Weight as of this encounter: 159 lb (72.1 kg).  Risk Assessment: Allergies: Reviewed. She is allergic to ranitidine; amphetamine-dextroamphetamine; lactose intolerance (gi); reglan [metoclopramide]; topiramate er; wheat bran; and sulfa antibiotics.  Allergy Precautions: None required Coagulopathies: Reviewed. None identified.  Blood-thinner therapy: None at this time Active Infection(s): Reviewed. None identified. Elizabeth Bowers is afebrile  Site Confirmation: Elizabeth Bowers was asked to confirm the procedure and laterality before marking the site Procedure checklist: Completed Consent: Before the procedure and under the influence of no sedative(s), amnesic(s), or anxiolytics, the patient was informed of the treatment options, risks and possible complications. To fulfill our ethical and legal obligations, as recommended by the American Medical Association's Code of Ethics, I have informed the patient of my clinical impression; the nature and purpose of the treatment or procedure; the risks, benefits, and possible complications of the intervention; the alternatives, including doing nothing; the risk(s) and benefit(s) of the alternative treatment(s) or procedure(s); and the risk(s) and benefit(s) of doing nothing. The patient was provided information about the general risks and possible complications associated with the procedure. These may include, but are not limited to: failure to achieve desired goals, infection, bleeding, organ or  nerve damage, allergic reactions, paralysis, and death. In addition, the patient was informed of those risks and complications associated to Spine-related procedures, such as failure to decrease pain; infection (i.e.: Meningitis, epidural or intraspinal  abscess); bleeding (i.e.: epidural hematoma, subarachnoid hemorrhage, or any other type of intraspinal or peri-dural bleeding); organ or nerve damage (i.e.: Any type of peripheral nerve, nerve root, or spinal cord injury) with subsequent damage to sensory, motor, and/or autonomic systems, resulting in permanent pain, numbness, and/or weakness of one or several areas of the body; allergic reactions; (i.e.: anaphylactic reaction); and/or death. Furthermore, the patient was informed of those risks and complications associated with the medications. These include, but are not limited to: allergic reactions (i.e.: anaphylactic or anaphylactoid reaction(s)); adrenal axis suppression; blood sugar elevation that in diabetics may result in ketoacidosis or comma; water retention that in patients with history of congestive heart failure may result in shortness of breath, pulmonary edema, and decompensation with resultant heart failure; weight gain; swelling or edema; medication-induced neural toxicity; particulate matter embolism and blood vessel occlusion with resultant organ, and/or nervous system infarction; and/or aseptic necrosis of one or more joints. Finally, the patient was informed that Medicine is not an exact science; therefore, there is also the possibility of unforeseen or unpredictable risks and/or possible complications that may result in a catastrophic outcome. The patient indicated having understood very clearly. We have given the patient no guarantees and we have made no promises. Enough time was given to the patient to ask questions, all of which were answered to the patient's satisfaction. Elizabeth Bowers has indicated that she wanted to continue with the  procedure. Attestation: I, the ordering provider, attest that I have discussed with the patient the benefits, risks, side-effects, alternatives, likelihood of achieving goals, and potential problems during recovery for the procedure that I have provided informed consent. Date  Time: 06/27/2018  2:08 PM  Pre-Procedure Preparation:  Monitoring: As per clinic protocol. Respiration, ETCO2, SpO2, BP, heart rate and rhythm monitor placed and checked for adequate function Safety Precautions: Patient was assessed for positional comfort and pressure points before starting the procedure. Time-out: I initiated and conducted the "Time-out" before starting the procedure, as per protocol. The patient was asked to participate by confirming the accuracy of the "Time Out" information. Verification of the correct person, site, and procedure were performed and confirmed by me, the nursing staff, and the patient. "Time-out" conducted as per Joint Commission's Universal Protocol (UP.01.01.01). Time: 1433  Description of Procedure:          Target Area: For Epidural Steroid injections the target is the interlaminar space, initially targeting the lower border of the superior vertebral body lamina. Approach: Paramedial approach. Area Prepped: Entire PosteriorCervical Region Prepping solution: ChloraPrep (2% chlorhexidine gluconate and 70% isopropyl alcohol) Safety Precautions: Aspiration looking for blood return was conducted prior to all injections. At no point did we inject any substances, as a needle was being advanced. No attempts were made at seeking any paresthesias. Safe injection practices and needle disposal techniques used. Medications properly checked for expiration dates. SDV (single dose vial) medications used. Description of the Procedure: Protocol guidelines were followed. The procedure needle was introduced through the skin, ipsilateral to the reported pain, and advanced to the target area. Bone was  contacted and the needle walked caudad, until the lamina was cleared. The epidural space was identified using "loss-of-resistance technique" with 2-3 ml of PF-NaCl (0.9% NSS), in a 5cc LOR glass syringe. Vitals:   06/27/18 1431 06/27/18 1433 06/27/18 1439 06/27/18 1440  BP:  127/86 138/86 140/90  Pulse:      Resp:  11 11 (!) 9  Temp:      SpO2: (!) 88% 98%  98% 97%  Weight:      Height:        Start Time: 1433 hrs. End Time: 1438 hrs. Materials:  Needle(s) Type: Epidural needle Gauge: 17G Length: 3.5-in Medication(s): Please see orders for medications and dosing details.  Imaging Guidance (Spinal):          Type of Imaging Technique: Fluoroscopy Guidance (Spinal) Indication(s): Assistance in needle guidance and placement for procedures requiring needle placement in or near specific anatomical locations not easily accessible without such assistance. Exposure Time: Please see nurses notes. Contrast: Before injecting any contrast, we confirmed that the patient did not have an allergy to iodine, shellfish, or radiological contrast. Once satisfactory needle placement was completed at the desired level, radiological contrast was injected. Contrast injected under live fluoroscopy. No contrast complications. See chart for type and volume of contrast used. Fluoroscopic Guidance: I was personally present during the use of fluoroscopy. "Tunnel Vision Technique" used to obtain the best possible view of the target area. Parallax error corrected before commencing the procedure. "Direction-depth-direction" technique used to introduce the needle under continuous pulsed fluoroscopy. Once target was reached, antero-posterior, oblique, and lateral fluoroscopic projection used confirm needle placement in all planes. Images permanently stored in EMR. Interpretation: I personally interpreted the imaging intraoperatively. Adequate needle placement confirmed in multiple planes. Appropriate spread of contrast into  desired area was observed. No evidence of afferent or efferent intravascular uptake. No intrathecal or subarachnoid spread observed. Permanent images saved into the patient's record.  Antibiotic Prophylaxis:   Anti-infectives (From admission, onward)   None     Indication(s): None identified  Post-operative Assessment:  Post-procedure Vital Signs:  Pulse/HCG Rate: 64(!) 58 Temp: 98.3 F (36.8 C) Resp: (!) 9 BP: 140/90 SpO2: 97 %  EBL: None  Complications: No immediate post-treatment complications observed by team, or reported by patient.  Note: The patient tolerated the entire procedure well. A repeat set of vitals were taken after the procedure and the patient was kept under observation following institutional policy, for this type of procedure. Post-procedural neurological assessment was performed, showing return to baseline, prior to discharge. The patient was provided with post-procedure discharge instructions, including a section on how to identify potential problems. Should any problems arise concerning this procedure, the patient was given instructions to immediately contact us, at any time, without hesitation. In any case, we plan to contact the patient by telephone for a follow-up status report regarding this interventional procedure.  Comments:  No additional relevant information.  Plan of Care    Imaging Orders     DG C-Arm 1-60 Min-No Report  Procedure Orders     Cervical Epidural Injection  Medications ordered for procedure: Meds ordered this encounter  Medications  . iopamidol (ISOVUE-M) 41 % intrathecal injection 10 mL    Must be Myelogram-compatible. If not available, you may substitute with a water-soluble, non-ionic, hypoallergenic, myelogram-compatible radiological contrast medium.  Marland Kitchen lidocaine (XYLOCAINE) 2 % (with pres) injection 400 mg  . midazolam (VERSED) 5 MG/5ML injection 1-2 mg    Make sure Flumazenil is available in the pyxis when using this  medication. If oversedation occurs, administer 0.2 mg IV over 15 sec. If after 45 sec no response, administer 0.2 mg again over 1 min; may repeat at 1 min intervals; not to exceed 4 doses (1 mg)  . fentaNYL (SUBLIMAZE) injection 25-50 mcg    Make sure Narcan is available in the pyxis when using this medication. In the event of respiratory depression (RR< 8/min): Titrate  NARCAN (naloxone) in increments of 0.1 to 0.2 mg IV at 2-3 minute intervals, until desired degree of reversal.  . lactated ringers infusion 1,000 mL  . sodium chloride flush (NS) 0.9 % injection 1 mL  . ropivacaine (PF) 2 mg/mL (0.2%) (NAROPIN) injection 1 mL  . dexamethasone (DECADRON) injection 10 mg   Medications administered: We administered iopamidol, lidocaine, midazolam, fentaNYL, lactated ringers, sodium chloride flush, ropivacaine (PF) 2 mg/mL (0.2%), and dexamethasone.  See the medical record for exact dosing, route, and time of administration.  Disposition: Discharge home  Discharge Date & Time: 06/27/2018; 1510 hrs.   Physician-requested Follow-up: Return for post-procedure eval (2 wks), w/ Dr. Dossie Arbour.  Future Appointments  Date Time Provider [place]  07/15/2018  2:00 PM Milinda Pointer, MD [facility] None  07/23/2018  2:15 PM Milinda Pointer, MD [facility] None  08/21/2018  8:40 AM Bacigalupo, [name], MD [facility] None   Primary Care Physician: Virginia Crews, MD Location: [facility] Note by: Gaspar Cola, MD Date: 06/27/2018; Time: 2:50 PM  Disclaimer:  Medicine is not an Chief Strategy Officer. The only guarantee in medicine is that nothing is guaranteed. It is important to note that the decision to proceed with this intervention was based on the information collected from the patient. The Data and conclusions were drawn from the patient's questionnaire, the interview, and the physical examination. Because the information was provided in large part by the  patient, it cannot be guaranteed that it has not been purposely or unconsciously manipulated. Every effort has been made to obtain as much relevant data as possible for this evaluation. It is important to note that the conclusions that lead to this procedure are derived in large part from the available data. Always take into account that the treatment will also be dependent on availability of resources and existing treatment guidelines, considered by other Pain Management Practitioners as being common knowledge and practice, at the time of the intervention. For Medico-Legal purposes, it is also important to point out that variation in procedural techniques and pharmacological choices are the acceptable norm. The indications, contraindications, technique, and results of the above procedure should only be interpreted and judged by a Board-Certified Interventional Pain Specialist with extensive familiarity and expertise in the same exact procedure and technique.

## 2018-06-27 NOTE — Progress Notes (Signed)
Safety precautions to be maintained throughout the outpatient stay will include: orient to surroundings, keep bed in low position, maintain call bell within reach at all times, provide assistance with transfer out of bed and ambulation.  

## 2018-06-27 NOTE — Patient Instructions (Addendum)
____________________________________________________________________________________________  Post-Procedure Discharge Instructions  Instructions:  Apply ice: Fill a plastic sandwich bag with crushed ice. Cover it with a small towel and apply to injection site. Apply for 15 minutes then remove x 15 minutes. Repeat sequence on day of procedure, until you go to bed. The purpose is to minimize swelling and discomfort after procedure.  Apply heat: Apply heat to procedure site starting the day following the procedure. The purpose is to treat any soreness and discomfort from the procedure.  Food intake: Start with clear liquids (like water) and advance to regular food, as tolerated.   Physical activities: Keep activities to a minimum for the first 8 hours after the procedure.   Driving: If you have received any sedation, you are not allowed to drive for 24 hours after your procedure.  Blood thinner: Restart your blood thinner 6 hours after your procedure. (Only for those taking blood thinners)  Insulin: As soon as you can eat, you may resume your normal dosing schedule. (Only for those taking insulin)  Infection prevention: Keep procedure site clean and dry.  Post-procedure Pain Diary: Extremely important that this be done correctly and accurately. Recorded information will be used to determine the next step in treatment.  Pain evaluated is that of treated area only. Do not include pain from an untreated area.  Complete every hour, on the hour, for the initial 8 hours. Set an alarm to help you do this part accurately.  Do not go to sleep and have it completed later. It will not be accurate.  Follow-up appointment: Keep your follow-up appointment after the procedure. Usually 2 weeks for most procedures. (6 weeks in the case of radiofrequency.) Bring you pain diary.   Expect:  From numbing medicine (AKA: Local Anesthetics): Numbness or decrease in pain.  Onset: Full effect within 15  minutes of injected.  Duration: It will depend on the type of local anesthetic used. On the average, 1 to 8 hours.   From steroids: Decrease in swelling or inflammation. Once inflammation is improved, relief of the pain will follow.  Onset of benefits: Depends on the amount of swelling present. The more swelling, the longer it will take for the benefits to be seen. In some cases, up to 10 days.  Duration: Steroids will stay in the system x 2 weeks. Duration of benefits will depend on multiple posibilities including persistent irritating factors.  Occasional side-effects: Facial flushing (red, warm cheeks) , cramps (if present, drink Gatorade and take over-the-counter Magnesium 450-500 mg once to twice a day).  From procedure: Some discomfort is to be expected once the numbing medicine wears off. This should be minimal if ice and heat are applied as instructed.  Call if:  You experience numbness and weakness that gets worse with time, as opposed to wearing off.  New onset bowel or bladder incontinence. (This applies to Spinal procedures only)  Emergency Numbers:  Durning business hours (Monday - Thursday, 8:00 AM - 4:00 PM) (Friday, 9:00 AM - 12:00 Noon): [phone]  After hours: [phone] ____________________________________________________________________________________________   Pain Management Discharge Instructions  General Discharge Instructions :  If you need to reach your doctor call: Monday-Friday 8:00 am - 4:00 pm at [phone] or toll free [phone].  After clinic hours [phone] to have operator reach doctor.  Bring all of your medication bottles to all your appointments in the pain clinic.  To cancel or reschedule your appointment with Pain Management please remember to call 24 hours in advance to   avoid a fee.  Refer to the educational materials which you have been given on: General Risks, I had my Procedure. Discharge Instructions, Post  Sedation.  Post Procedure Instructions:  The drugs you were given will stay in your system until tomorrow, so for the next 24 hours you should not drive, make any legal decisions or drink any alcoholic beverages.  You may eat anything you prefer, but it is better to start with liquids then soups and crackers, and gradually work up to solid foods.  Please notify your doctor immediately if you have any unusual bleeding, trouble breathing or pain that is not related to your normal pain.  Depending on the type of procedure that was done, some parts of your body may feel week and/or numb.  This usually clears up by tonight or the next day.  Walk with the use of an assistive device or accompanied by an adult for the 24 hours.  You may use ice on the affected area for the first 24 hours.  Put ice in a Ziploc bag and cover with a towel and place against area 15 minutes on 15 minutes off.  You may switch to heat after 24 hours.Epidural Steroid Injection Patient Information  Description: The epidural space surrounds the nerves as they exit the spinal cord.  In some patients, the nerves can be compressed and inflamed by a bulging disc or a tight spinal canal (spinal stenosis).  By injecting steroids into the epidural space, we can bring irritated nerves into direct contact with a potentially helpful medication.  These steroids act directly on the irritated nerves and can reduce swelling and inflammation which often leads to decreased pain.  Epidural steroids may be injected anywhere along the spine and from the neck to the low back depending upon the location of your pain.   After numbing the skin with local anesthetic (like Novocaine), a small needle is passed into the epidural space slowly.  You may experience a sensation of pressure while this is being done.  The entire block usually last less than 10 minutes.  Conditions which may be treated by epidural steroids:   Low back and leg pain  Neck and  arm pain  Spinal stenosis  Post-laminectomy syndrome  Herpes zoster (shingles) pain  Pain from compression fractures  Preparation for the injection:  1. Do not eat any solid food or dairy products within 8 hours of your appointment.  2. You may drink clear liquids up to 3 hours before appointment.  Clear liquids include water, black coffee, juice or soda.  No milk or cream please. 3. You may take your regular medication, including pain medications, with a sip of water before your appointment  Diabetics should hold regular insulin (if taken separately) and take 1/2 normal NPH dos the morning of the procedure.  Carry some sugar containing items with you to your appointment. 4. A driver must accompany you and be prepared to drive you home after your procedure.  5. Bring all your current medications with your. 6. An IV may be inserted and sedation may be given at the discretion of the physician.   7. A blood pressure cuff, EKG and other monitors will often be applied during the procedure.  Some patients may need to have extra oxygen administered for a short period. 8. You will be asked to provide medical information, including your allergies, prior to the procedure.  We must know immediately if you are taking blood thinners (like Coumadin/Warfarin)  Or  if you are allergic to IV iodine contrast (dye). We must know if you could possible be pregnant.  Possible side-effects:  Bleeding from needle site  Infection (rare, may require surgery)  Nerve injury (rare)  Numbness & tingling (temporary)  Difficulty urinating (rare, temporary)  Spinal headache ( a headache worse with upright posture)  Light -headedness (temporary)  Pain at injection site (several days)  Decreased blood pressure (temporary)  Weakness in arm/leg (temporary)  Pressure sensation in back/neck (temporary)  Call if you experience:  Fever/chills associated with headache or increased back/neck pain.  Headache  worsened by an upright position.  New onset weakness or numbness of an extremity below the injection site  Hives or difficulty breathing (go to the emergency room)  Inflammation or drainage at the infection site  Severe back/neck pain  Any new symptoms which are concerning to you  Please note:  Although the local anesthetic injected can often make your back or neck feel good for several hours after the injection, the pain will likely return.  It takes 3-7 days for steroids to work in the epidural space.  You may not notice any pain relief for at least that one week.  If effective, we will often do a series of three injections spaced 3-6 weeks apart to maximally decrease your pain.  After the initial series, we generally will wait several months before considering a repeat injection of the same type.  If you have any questions, please call [phone] [facility]

## 2018-06-28 ENCOUNTER — Telehealth: Payer: Self-pay

## 2018-06-28 NOTE — Telephone Encounter (Signed)
Pt was called and no problems reported. 

## 2018-07-01 ENCOUNTER — Other Ambulatory Visit: Payer: Self-pay | Admitting: Gastroenterology

## 2018-07-01 ENCOUNTER — Telehealth: Payer: Self-pay | Admitting: *Deleted

## 2018-07-04 ENCOUNTER — Telehealth: Payer: Self-pay | Admitting: Gastroenterology

## 2018-07-04 NOTE — Telephone Encounter (Signed)
Pt is calling she needs refill on rx Oansoprazol 30 mg 2 times a day  Send to [place] we have scheduled her a f/u apt for 08/01/18

## 2018-07-09 ENCOUNTER — Ambulatory Visit: Payer: 59

## 2018-07-10 ENCOUNTER — Ambulatory Visit
Admission: RE | Admit: 2018-07-10 | Discharge: 2018-07-10 | Disposition: A | Discharge status: Home / Self Care | Payer: 59 | Source: Ambulatory Visit | Attending: Pain Medicine | Admitting: Pain Medicine

## 2018-07-10 DIAGNOSIS — M4802 Spinal stenosis, cervical region: Secondary | ICD-10-CM | POA: Diagnosis not present

## 2018-07-10 DIAGNOSIS — M5412 Radiculopathy, cervical region: Secondary | ICD-10-CM | POA: Diagnosis not present

## 2018-07-10 DIAGNOSIS — M5030 Other cervical disc degeneration, unspecified cervical region: Secondary | ICD-10-CM | POA: Diagnosis not present

## 2018-07-10 DIAGNOSIS — M4310 Spondylolisthesis, site unspecified: Secondary | ICD-10-CM | POA: Diagnosis not present

## 2018-07-10 DIAGNOSIS — M542 Cervicalgia: Secondary | ICD-10-CM | POA: Diagnosis not present

## 2018-07-10 DIAGNOSIS — M47812 Spondylosis without myelopathy or radiculopathy, cervical region: Secondary | ICD-10-CM | POA: Diagnosis not present

## 2018-07-10 DIAGNOSIS — M4692 Unspecified inflammatory spondylopathy, cervical region: Secondary | ICD-10-CM | POA: Insufficient documentation

## 2018-07-15 ENCOUNTER — Ambulatory Visit: Payer: 59 | Admitting: Pain Medicine

## 2018-07-15 ENCOUNTER — Other Ambulatory Visit: Payer: Self-pay | Admitting: Gastroenterology

## 2018-07-15 NOTE — Telephone Encounter (Signed)
Pt is calling she needs refill on rx Oansoprazol 30 mg 2 times a day  Send to [place] we have scheduled her a f/u apt for 08/01/18. She was under the impression that a refill would be called in to cover her until her appointment . Please call in, she does not have any.

## 2018-07-16 ENCOUNTER — Other Ambulatory Visit: Payer: Self-pay | Admitting: Gastroenterology

## 2018-07-16 DIAGNOSIS — K219 Gastro-esophageal reflux disease without esophagitis: Secondary | ICD-10-CM

## 2018-07-16 MED ORDER — LANSOPRAZOLE 30 MG PO CPDR: 30.0000 mg | DELAYED_RELEASE_CAPSULE | Freq: Two times a day (BID) | ORAL | 0 refills | Status: DC

## 2018-07-16 NOTE — Telephone Encounter (Signed)
PT called to check on status on her refill she states she had left 2 messages and her pharmacy still did not have the rx. Spoke with Dr. Marius Ditch and she will call in the refill now

## 2018-07-16 NOTE — Progress Notes (Signed)
Patient's Name: Elizabeth Bowers  MRN: [id number]  Referring Provider: Virginia Crews, MD  DOB: 12-Oct-1965  PCP: Virginia Crews, MD  DOS: 07/17/2018  Note by: Gaspar Cola, MD  Service setting: Ambulatory outpatient  Specialty: Interventional Pain Management  Location: [facility] (AMB) Pain Management Facility    Patient type: Established   Primary Reason(s) for Visit: Encounter for post-procedure evaluation of chronic illness with mild to moderate exacerbation CC: Back Pain (lower)  HPI  [name] is a 53 y.o. year old, female patient, who comes today for a post-procedure evaluation. She has Rectal bleeding; Chronic sacroiliac joint pain (Right); Hypertension; Superficial thrombophlebitis; Fatty liver; Colon polyps; Fibromyalgia; Sinusitis, chronic; Allergic rhinitis; IBS (irritable bowel syndrome); Chronic GERD; Eyelid dermatitis, allergic/contact; Anxiety; Chronic knee pain (Right); DDD (degenerative disc disease), lumbar; Family hx of colon cancer; Overweight; Chronic low back pain (Secondary Area of Pain) (Bilateral) (L>R) w/ sciatica (Bilateral); Chronic lower extremity pain (Tertiary Area of Pain) (Bilateral) (L>R); Chronic neck pain (Primary Area of Pain) (Bilateral) (midline); Chronic foot pain (Left); Chronic pain syndrome; Pharmacologic therapy; Disorder of skeletal system; Problems influencing health status; Dyspepsia; Ganglion cyst; Chronic upper extremity pain (Fourth Area of Pain) (Bilateral) (R>L); Cervical Grade 1 (2 mm) Retrolisthesis C5 over C6; Cervical foraminal stenosis (C3-4 & C5-6) (Bilateral); DDD (degenerative disc disease), cervical; Long term prescription benzodiazepine use; CKD (chronic kidney disease) stage 3, GFR 30-59 ml/min ([place]); Acid reflux disease; Spondylosis without myelopathy or radiculopathy, cervical region; Chronic hip pain (Bilateral) (L>R); Lumbar facet syndrome (Bilateral) (R>L); Spondylosis without myelopathy or radiculopathy, lumbar region; Chronic  Sacroiliac joint dysfunction (Bilateral) (R>L); Chronic Somatic dysfunction of sacroiliac joint (Bilateral) (R>L); Osteoarthritis of hip (Bilateral); Cervicogenic headache (Bilateral); Cervicalgia (Bilateral) (R>L); Cervical facet syndrome (Bilateral) (R>L); Chronic occipital neuralgia (Bilateral); Cervical spondylitis with radiculitis (HCC) (C6) (Bilateral) (R>L); Chronic sacroiliac joint pain (Bilateral) (R>L); and Abnormal MRI, cervical spine (07/10/2018) on their problem list. Her primarily concern today is the Back Pain (lower)  Pain Assessment: Location: Lower Back Radiating: both legs to the calves Onset: More than a month ago Duration: Chronic pain Quality: Constant, Stabbing Severity: 6 /10 (subjective, self-reported pain score)  Note: Reported level is inconsistent with clinical observations. Clinically the patient looks like a 3/10 A 3/10 is viewed as "Moderate" and described as significantly interfering with activities of daily living (ADL). It becomes difficult to feed, bathe, get dressed, get on and off the toilet or to perform personal hygiene functions. Difficult to get in and out of bed or a chair without assistance. Very distracting. With effort, it can be ignored when deeply involved in activities. [name] does not seem to understand the use of our objective pain scale When using our objective Pain Scale, levels between 6 and 10/10 are said to belong in an emergency room, as it progressively worsens from a 6/10, described as severely limiting, requiring emergency care not usually available at an outpatient pain management facility. At a 6/10 level, communication becomes difficult and requires great effort. Assistance to reach the emergency department may be required. Facial flushing and profuse sweating along with potentially dangerous increases in heart rate and blood pressure will be evident. Timing: Constant Modifying factors: nothing BP: 121/77  HR: 63  [name] comes in today  for post-procedure evaluation. We ordered an MRI of the cervical spine. The patient presents with symptoms compatible with a possible bilateral C6 radiculitis.  Today the patient comes into the clinic for postprocedure evaluation and review of the cervical MRI.  The MRI have confirmed that the patient has mild spinal stenosis at the C5-6 level with bilateral foraminal stenosis.  At this level what exits the spine is the C6 nerve.  This would be likely to be the etiology of the patient's symptoms.  In addition, the MRI has confirmed that she also has bilateral C3-4 foraminal stenosis with facet arthropathy.  At this level what exits the spine is the C4 nerve root.  This could be responsible for the patient's pain in the shoulder region. Because the patient has a prior history of an ulnar entrapment and release, she was assuming that all of the problems in the hands were secondary to that.  Today we have clarified that this may not be the case. To further clarify whether we are dealing with a peripheral neuropathy versus a radiculopathy of the cervical spine, we ordered a nerve conduction test (EMG/PNCV) (scheduled for 07/23/18) of both upper extremities since the patient had indicated having pain, numbness, and weakness of the upper extremities.  The plan is to get the cervical problems under control and then turn our attention to her lumbar spine.  She indicated that she is beginning to experience some more pain in the lower back, but the neck is still much worse.  Recently she presented with physical exam evidence of bilateral sacroiliac joint pain and bilateral hip pain, that we will address in the near future.  However, her low back pain seems to be worsening.  On 05/09/2018 she had a bilateral lumbar facet block #1 done that provided her with complete relief of her low back pain until recently when he started going back.  Her recent cervical epidural steroid injection has provided her with excellent relief of  her neck pain, upper extremity pain, and her cervicogenic headaches.  Because of that, she is now focusing more on her lower back and she had several questions today as to whether or not she could do a repeat lumbar facet block for doing her cervical facet radiofrequency.  I gave her several options but at this point she is undecided.  She is scheduled to return on 07/23/2018 for her right-sided cervical facet radiofrequency, but she is considering the possibility of postponing that one and having a second treatment for her low back pain done first.  Interestingly enough, she also has heard nerve conduction test scheduled for the same day at 9 AM.  I told her to make some calls and find out how long that still he takes so that she can determine whether or not she will be able to make it on time for her RFA.  At this point, she is unable to tell me what her final decision is and therefore she indicated that she will be calling to let us know.  In either case, I can either do her cervical facet radiofrequency or the diagnostic bilateral lumbar facet block, whenever she feels she needs first.  We will simply reschedule the other for later on.  Further details on both, my assessment(s), as well as the proposed treatment plan, please see below.  Post-Procedure Assessment  06/27/2018 Procedure: Diagnostic right-sided interlaminar Cervical ESI #1 under fluoroscopic guidance and IV sedation Pre-procedure pain score:  5/10 Post-procedure pain score: 0/10 (100% relief) Influential Factors: BMI: 29.08 kg/m Intra-procedural challenges: None observed.         Assessment challenges: None detected.              Reported side-effects: None.  Post-procedural adverse reactions or complications: None reported         Sedation: Sedation provided. When no sedatives are used, the analgesic levels obtained are directly associated to the effectiveness of the local anesthetics. However, when sedation is provided, the  level of analgesia obtained during the initial 1 hour following the intervention, is believed to be the result of a combination of factors. These factors may include, but are not limited to: 1. The effectiveness of the local anesthetics used. 2. The effects of the analgesic(s) and/or anxiolytic(s) used. 3. The degree of discomfort experienced by the patient at the time of the procedure. 4. The patients ability and reliability in recalling and recording the events. 5. The presence and influence of possible secondary gains and/or psychosocial factors. Reported result: Relief experienced during the 1st hour after the procedure: 100 % (Ultra-Short Term Relief)            Interpretative annotation: Clinically appropriate result. Analgesia during this period is likely to be Local Anesthetic and/or IV Sedative (Analgesic/Anxiolytic) related.          Effects of local anesthetic: The analgesic effects attained during this period are directly associated to the localized infiltration of local anesthetics and therefore cary significant diagnostic value as to the etiological location, or anatomical origin, of the pain. Expected duration of relief is directly dependent on the pharmacodynamics of the local anesthetic used. Long-acting (4-6 hours) anesthetics used.  Reported result: Relief during the next 4 to 6 hour after the procedure: 20 % (Short-Term Relief)            Interpretative annotation: Clinically appropriate result. Analgesia during this period is likely to be Local Anesthetic-related.          Long-term benefit: Defined as the period of time past the expected duration of local anesthetics (1 hour for short-acting and 4-6 hours for long-acting). With the possible exception of prolonged sympathetic blockade from the local anesthetics, benefits during this period are typically attributed to, or associated with, other factors such as analgesic sensory neuropraxia, antiinflammatory effects, or beneficial  biochemical changes provided by agents other than the local anesthetics.  Reported result: Extended relief following procedure: 70 % (Long-Term Relief)            Interpretative annotation: Clinically possible results. Good relief. No permanent benefit expected. Inflammation plays a part in the etiology to the pain.          Current benefits: Defined as reported results that persistent at this point in time.   Analgesia: 50-75 % Ms. Brooklyn reports improvement of axial and extremity symptoms.  She indicates that since her cervical epidural she has not experienced any more cervicogenic headaches, but she is also limiting her cervical spine range of motion because she is afraid that this may trigger the pain to come back. Function: Somewhat improved ROM: Somewhat improved Interpretative annotation: Ongoing benefit. Therapeutic benefit observed. Effective therapeutic approach.          Interpretation: Results would suggest a successful diagnostic intervention.                  Plan:  Set up procedure as a PRN palliative treatment option for this patient.                Laboratory Chemistry  Inflammation Markers (CRP: Acute Phase) (ESR: Chronic Phase) Lab Results  Component Value Date   CRP 8 01/16/2018   ESRSEDRATE 16 01/16/2018  Renal Markers Lab Results  Component Value Date   BUN 11 01/16/2018   CREATININE 1.22 (H) 01/16/2018   BCR 9 01/16/2018   GFRAA 58 (L) 01/16/2018   GFRNONAA 51 (L) 01/16/2018                             Hepatic Markers Lab Results  Component Value Date   AST 21 01/16/2018   ALT 24 11/26/2017   ALBUMIN 4.5 01/16/2018                        Neuropathy Markers Lab Results  Component Value Date   VITAMINB12 1,137 01/16/2018   HGBA1C 5.3 12/13/2017   HIV Non Reactive 12/16/2016                        Hematology Parameters Lab Results  Component Value Date   PLT 303 11/26/2017   HGB 14.6 11/26/2017   HCT 42.6 11/26/2017                         Note: Lab results reviewed.  Recent Imaging Results   Results for orders placed in visit on 06/27/18  DG C-Arm 1-60 Min-No Report   Narrative Fluoroscopy was utilized by the requesting physician.  No radiographic  interpretation.         Interpretation Report: Fluoroscopy was used during the procedure to assist with needle guidance. The images were interpreted intraoperatively by the requesting physician.  Meds   Current Outpatient Medications:  .  docusate sodium (COLACE) 100 MG capsule, Take 200 mg by mouth at bedtime. , Disp: , Rfl:  .  DULoxetine (CYMBALTA) 60 MG capsule, TAKE 1 CAPSULE (60 MG TOTAL) BY MOUTH DAILY., Disp: 30 capsule, Rfl: 11 .  lansoprazole (PREVACID) 30 MG capsule, TAKE 1 CAPSULE BY MOUTH TWICE DAILY, Disp: 60 capsule, Rfl: 1 .  lansoprazole (PREVACID) 30 MG capsule, Take 1 capsule (30 mg total) by mouth 2 (two) times daily before a meal., Disp: 180 capsule, Rfl: 0 .  LINZESS 290 MCG CAPS capsule, Take 1 capsule (290 mcg total) by mouth daily., Disp: 90 capsule, Rfl: 3 .  Multiple Vitamin (MULTIVITAMIN WITH MINERALS) TABS tablet, Take 1 tablet by mouth daily., Disp: , Rfl:  .  triamterene-hydrochlorothiazide (MAXZIDE-25) 37.5-25 MG tablet, Take 1 tablet by mouth daily., Disp: 90 tablet, Rfl: 3 .  vitamin C (ASCORBIC ACID) 500 MG tablet, Take 500 mg by mouth daily., Disp: , Rfl:  .  Magnesium Oxide 500 MG CAPS, Take 1 capsule (500 mg total) by mouth at bedtime as needed and may repeat dose one time if needed., Disp: 90 capsule, Rfl: 0 .  potassium chloride SA (K-DUR,KLOR-CON) 20 MEQ tablet, Take 1 tablet (20 mEq total) by mouth daily., Disp: 30 tablet, Rfl: 5  ROS  Constitutional: Denies any fever or chills Gastrointestinal: No reported hemesis, hematochezia, vomiting, or acute GI distress Musculoskeletal: Denies any acute onset joint swelling, redness, loss of ROM, or weakness Neurological: No reported episodes of acute onset apraxia, aphasia,  dysarthria, agnosia, amnesia, paralysis, loss of coordination, or loss of consciousness  Allergies  Ms. Johannesen is allergic to ranitidine; amphetamine-dextroamphetamine; lactose intolerance (gi); reglan [metoclopramide]; topiramate er; wheat bran; and sulfa antibiotics.  [place]  Drug: Ms. Ausburn  reports no history of drug use. Alcohol:  reports current alcohol use of about 2.0 - 4.0 standard  drinks of alcohol per week. Tobacco:  reports that she quit smoking about 31 years ago. Her smoking use included cigarettes. She has a 3.75 pack-year smoking history. She has never used smokeless tobacco. Medical:  has a past medical history of Fatty liver, Fibromyalgia, GERD (gastroesophageal reflux disease), Hemorrhoids, Hot flashes, Hypertension, IBS (irritable bowel syndrome), LUQ abdominal pain (10/25/2016), Migraines, Miscarriage, Multilevel degenerative disc disease, and Vocal cord dysfunction. Surgical: Ms. Retz  has a past surgical history that includes Elbow Debridement; Foot surgery; Cholecystectomy; Colonoscopy; Abdominal hysterectomy; and Esophagogastroduodenoscopy (egd) with propofol (N/A, 02/06/2018). Family: family history includes Alcohol abuse in her father and son; Arthritis in her son and son; Asthma in her sister; COPD in her father, paternal grandmother, and sister; Cancer in her paternal grandfather and sister; Colon cancer in her sister; Colon polyps in her sister; Depression in her son; Diabetes in her maternal grandmother, mother, and son; Drug abuse in her son; Glaucoma in her mother; Hyperlipidemia in her mother; Hypertension in her mother; Kidney disease in her sister; Mental illness in her sister and son; Miscarriages / Stillbirths in her sister and sister; Uterine cancer in her sister.  Constitutional Exam  General appearance: Well nourished, well developed, and well hydrated. In no apparent acute distress Vitals:   07/17/18 0822  BP: 121/77  Pulse: 63  Resp: 16  Temp: 98.2 F (36.8 C)    TempSrc: Oral  SpO2: 97%  Weight: 159 lb (72.1 kg)  Height: '5\' 2"'  (1.575 m)   BMI Assessment: Estimated body mass index is 29.08 kg/m as calculated from the following:   Height as of this encounter: '5\' 2"'  (1.575 m).   Weight as of this encounter: 159 lb (72.1 kg).  BMI interpretation table: BMI level Category Range association with higher incidence of chronic pain  <18 kg/m2 Underweight   18.5-24.9 kg/m2 Ideal body weight   25-29.9 kg/m2 Overweight Increased incidence by 20%  30-34.9 kg/m2 Obese (Class I) Increased incidence by 68%  35-39.9 kg/m2 Severe obesity (Class II) Increased incidence by 136%  >40 kg/m2 Extreme obesity (Class III) Increased incidence by 254%   Patient's current BMI Ideal Body weight  Body mass index is 29.08 kg/m. Ideal body weight: 50.1 kg (110 lb 7.2 oz) Adjusted ideal body weight: 58.9 kg (129 lb 13.9 oz)   BMI Readings from Last 4 Encounters:  07/17/18 29.08 kg/m  06/27/18 29.08 kg/m  06/26/18 29.26 kg/m  06/13/18 30.91 kg/m   Wt Readings from Last 4 Encounters:  07/17/18 159 lb (72.1 kg)  06/27/18 159 lb (72.1 kg)  06/26/18 160 lb (72.6 kg)  06/13/18 169 lb (76.7 kg)  Psych/Mental status: Alert, oriented x 3 (person, place, & time)       Eyes: PERLA Respiratory: No evidence of acute respiratory distress  Cervical Spine Area Exam  Skin & Axial Inspection: No masses, redness, edema, swelling, or associated skin lesions Alignment: Symmetrical Functional ROM: Unrestricted ROM      Stability: No instability detected Muscle Tone/Strength: Functionally intact. No obvious neuro-muscular anomalies detected. Sensory (Neurological): Unimpaired Palpation: No palpable anomalies              Upper Extremity (UE) Exam    Side: Right upper extremity  Side: Left upper extremity  Skin & Extremity Inspection: Skin color, temperature, and hair growth are WNL. No peripheral edema or cyanosis. No masses, redness, swelling, asymmetry, or associated skin  lesions. No contractures.  Skin & Extremity Inspection: Skin color, temperature, and hair growth are WNL. No peripheral  edema or cyanosis. No masses, redness, swelling, asymmetry, or associated skin lesions. No contractures.  Functional ROM: Unrestricted ROM          Functional ROM: Unrestricted ROM          Muscle Tone/Strength: Functionally intact. No obvious neuro-muscular anomalies detected.  Muscle Tone/Strength: Functionally intact. No obvious neuro-muscular anomalies detected.  Sensory (Neurological): Unimpaired          Sensory (Neurological): Unimpaired          Palpation: No palpable anomalies              Palpation: No palpable anomalies              Provocative Test(s):  Phalen's test: deferred Tinel's test: deferred Apley's scratch test (touch opposite shoulder):  Action 1 (Across chest): deferred Action 2 (Overhead): deferred Action 3 (LB reach): deferred   Provocative Test(s):  Phalen's test: deferred Tinel's test: deferred Apley's scratch test (touch opposite shoulder):  Action 1 (Across chest): deferred Action 2 (Overhead): deferred Action 3 (LB reach): deferred    Thoracic Spine Area Exam  Skin & Axial Inspection: No masses, redness, or swelling Alignment: Symmetrical Functional ROM: Unrestricted ROM Stability: No instability detected Muscle Tone/Strength: Functionally intact. No obvious neuro-muscular anomalies detected. Sensory (Neurological): Unimpaired Muscle strength & Tone: No palpable anomalies  Lumbar Spine Area Exam  Skin & Axial Inspection: No masses, redness, or swelling Alignment: Symmetrical Functional ROM: Unrestricted ROM       Stability: No instability detected Muscle Tone/Strength: Functionally intact. No obvious neuro-muscular anomalies detected. Sensory (Neurological): Unimpaired Palpation: No palpable anomalies       Provocative Tests: Hyperextension/rotation test: deferred today       Lumbar quadrant test (Kemp's test): deferred today        Lateral bending test: deferred today       Patrick's Maneuver: deferred today                   FABER* test: deferred today                   S-I anterior distraction/compression test: deferred today         S-I lateral compression test: deferred today         S-I Thigh-thrust test: deferred today         S-I Gaenslen's test: deferred today         *(Flexion, ABduction and External Rotation)  Gait & Posture Assessment  Ambulation: Unassisted Gait: Relatively normal for age and body habitus Posture: WNL   Lower Extremity Exam    Side: Right lower extremity  Side: Left lower extremity  Stability: No instability observed          Stability: No instability observed          Skin & Extremity Inspection: Skin color, temperature, and hair growth are WNL. No peripheral edema or cyanosis. No masses, redness, swelling, asymmetry, or associated skin lesions. No contractures.  Skin & Extremity Inspection: Skin color, temperature, and hair growth are WNL. No peripheral edema or cyanosis. No masses, redness, swelling, asymmetry, or associated skin lesions. No contractures.  Functional ROM: Unrestricted ROM                  Functional ROM: Unrestricted ROM                  Muscle Tone/Strength: Functionally intact. No obvious neuro-muscular anomalies detected.  Muscle Tone/Strength: Functionally intact. No obvious neuro-muscular anomalies  detected.  Sensory (Neurological): Unimpaired        Sensory (Neurological): Unimpaired        DTR: Patellar: deferred today Achilles: deferred today Plantar: deferred today  DTR: Patellar: deferred today Achilles: deferred today Plantar: deferred today  Palpation: No palpable anomalies  Palpation: No palpable anomalies   Assessment   Status Diagnosis  Controlled Controlled Controlled 1. Chronic neck pain (Primary Area of Pain) (Bilateral) (midline)   2. Chronic low back pain (Secondary Area of Pain) (Bilateral) (L>R) w/ sciatica (Bilateral)   3. Chronic  lower extremity pain (Tertiary Area of Pain) (Bilateral) (L>R)   4. Chronic occipital neuralgia (Bilateral)   5. Cervicalgia (Bilateral) (R>L)   6. Cervicogenic headache (Bilateral)   7. Cervical spondylitis with radiculitis (HCC) (C6) (Bilateral) (R>L)   8. Cervical Grade 1 (2 mm) Retrolisthesis C5 over C6   9. Cervical foraminal stenosis (C3-4 & C5-6) (Bilateral)   10. Cervical facet syndrome (Bilateral) (R>L)   11. Abnormal MRI, cervical spine (07/10/2018)   12. Lumbar facet syndrome (Bilateral) (R>L)   13. Chronic sacroiliac joint pain (Bilateral) (R>L)      Updated Problems: Problem  Abnormal MRI, cervical spine (07/10/2018)   FINDINGS: Alignment: Mild retrolisthesis C5-6.  Disc levels: C3-4: Mild foraminal narrowing bilaterally due to mild uncinate spurring and mild facet degeneration. C5-6: Mild retrolisthesis. Disc degeneration and diffuse uncinate spurring causing moderate foraminal stenosis bilaterally and mild spinal stenosis. C6-7: Mild disc degeneration.  IMPRESSION: Mild foraminal narrowing bilaterally at C3-4 due to disc and facet degeneration with spurring  Mild spinal stenosis C5-6 with moderate foraminal encroachment bilaterally due to spurring.  Electronically Signed   By: Franchot Gallo M.D.   On: 07/10/2018 11:22     Plan of Care  Pharmacotherapy (Medications Ordered): No orders of the defined types were placed in this encounter.  Medications administered today: Sybella L. Poteet had no medications administered during this visit.  Procedure Orders    No procedure(s) ordered today   Lab Orders  No laboratory test(s) ordered today   Imaging Orders  No imaging studies ordered today   Referral Orders  No referral(s) requested today   Interventional management options: Planned, scheduled, and/or pending:   Therapeutic right-sided cervical facet RFA #1 under fluoroscopic guidance and IV sedation (scheduled for 07/23/18). Once this is completed, we will  follow with the left side, 2 weeks later. The patient may decide to first do a bilateral lumbar facet block #2, depending on how she feels.   Considering:   Possible bilateral cervical facet RFA #1 Diagnostic bilateral lumbar facet block #2  Possible bilateral lumbar facet RFA #1 Diagnostic right-sided sacroiliac joint block Possible right-sided sacroiliac joint RFA Diagnostic bilateral intra-articular hip joint injection with local anesthetic and steroid Diagnostic bilateral femoral nerve block + obturator nerve block Possible bilateral femoral nerve + obturator nerve RFA Diagnostic bilateral L5 transforaminal ESI Diagnostic bilateral intra-articular knee joint injection with local anesthetic and steroid Therapeutic series of 5 intra-articular bilateral Hyalgan knee injections Diagnostic bilateral genicular nerve block Possible bilateral genicular nerve RFA   Palliative PRN treatment(s):   Palliative bilateral cervical facet blocks Diagnostic bilaterallumbarfacet block #2  Diagnostic right-sided interlaminar Cervical ESI #2    Provider-requested follow-up: Return for RFA (fluoro + sedation): (R) C-FCT RFA #1.  Future Appointments  Date Time Provider [place]  07/23/2018  2:15 PM Milinda Pointer, MD [facility] None  08/01/2018  1:15 PM Lin Landsman, MD [facility] None  08/21/2018  8:40 AM Brita Romp, [name],  MD [facility] None   Primary Care Physician: Virginia Crews, MD Location: [facility] Note by: Gaspar Cola, MD Date: 07/17/2018; Time: 9:34 AM

## 2018-07-17 ENCOUNTER — Encounter: Payer: Self-pay | Admitting: Pain Medicine

## 2018-07-17 ENCOUNTER — Ambulatory Visit: Payer: 59 | Attending: Pain Medicine | Admitting: Pain Medicine

## 2018-07-17 ENCOUNTER — Other Ambulatory Visit: Payer: Self-pay

## 2018-07-17 VITALS — BP 121/77 | HR 63 | Temp 98.2°F | Resp 16 | Ht 62.0 in | Wt 159.0 lb

## 2018-07-17 DIAGNOSIS — M4310 Spondylolisthesis, site unspecified: Secondary | ICD-10-CM | POA: Diagnosis not present

## 2018-07-17 DIAGNOSIS — G8929 Other chronic pain: Secondary | ICD-10-CM | POA: Insufficient documentation

## 2018-07-17 DIAGNOSIS — M4692 Unspecified inflammatory spondylopathy, cervical region: Secondary | ICD-10-CM | POA: Diagnosis not present

## 2018-07-17 DIAGNOSIS — G4486 Cervicogenic headache: Secondary | ICD-10-CM

## 2018-07-17 DIAGNOSIS — M4802 Spinal stenosis, cervical region: Secondary | ICD-10-CM | POA: Diagnosis not present

## 2018-07-17 DIAGNOSIS — M47812 Spondylosis without myelopathy or radiculopathy, cervical region: Secondary | ICD-10-CM | POA: Diagnosis not present

## 2018-07-17 DIAGNOSIS — R51 Headache: Secondary | ICD-10-CM | POA: Insufficient documentation

## 2018-07-17 DIAGNOSIS — M5441 Lumbago with sciatica, right side: Secondary | ICD-10-CM | POA: Insufficient documentation

## 2018-07-17 DIAGNOSIS — R937 Abnormal findings on diagnostic imaging of other parts of musculoskeletal system: Secondary | ICD-10-CM | POA: Insufficient documentation

## 2018-07-17 DIAGNOSIS — M542 Cervicalgia: Secondary | ICD-10-CM | POA: Insufficient documentation

## 2018-07-17 DIAGNOSIS — M5481 Occipital neuralgia: Secondary | ICD-10-CM | POA: Insufficient documentation

## 2018-07-17 DIAGNOSIS — M47816 Spondylosis without myelopathy or radiculopathy, lumbar region: Secondary | ICD-10-CM | POA: Insufficient documentation

## 2018-07-17 DIAGNOSIS — M533 Sacrococcygeal disorders, not elsewhere classified: Secondary | ICD-10-CM | POA: Insufficient documentation

## 2018-07-17 DIAGNOSIS — M79604 Pain in right leg: Secondary | ICD-10-CM | POA: Insufficient documentation

## 2018-07-17 DIAGNOSIS — M79605 Pain in left leg: Secondary | ICD-10-CM | POA: Diagnosis not present

## 2018-07-17 DIAGNOSIS — M5442 Lumbago with sciatica, left side: Secondary | ICD-10-CM | POA: Diagnosis not present

## 2018-07-17 DIAGNOSIS — M5412 Radiculopathy, cervical region: Secondary | ICD-10-CM | POA: Insufficient documentation

## 2018-07-17 NOTE — Progress Notes (Signed)
Safety precautions to be maintained throughout the outpatient stay will include: orient to surroundings, keep bed in low position, maintain call bell within reach at all times, provide assistance with transfer out of bed and ambulation.  

## 2018-07-17 NOTE — Telephone Encounter (Signed)
Medication has been refilled and sent to pharmacy on 07/16/2018

## 2018-07-17 NOTE — Patient Instructions (Addendum)
____________________________________________________________________________________________  Preparing for Procedure with Sedation  Instructions: . Oral Intake: Do not eat or drink anything for at least 8 hours prior to your procedure. . Transportation: Public transportation is not allowed. Bring an adult driver. The driver must be physically present in our waiting room before any procedure can be started. . Physical Assistance: Bring an adult physically capable of assisting you, in the event you need help. This adult should keep you company at home for at least 6 hours after the procedure. . Blood Pressure Medicine: Take your blood pressure medicine with a sip of water the morning of the procedure. . Blood thinners: Notify our staff if you are taking any blood thinners. Depending on which one you take, there will be specific instructions on how and when to stop it. . Diabetics on insulin: Notify the staff so that you can be scheduled 1st case in the morning. If your diabetes requires high dose insulin, take only  of your normal insulin dose the morning of the procedure and notify the staff that you have done so. . Preventing infections: Shower with an antibacterial soap the morning of your procedure. . Build-up your immune system: Take 1000 mg of Vitamin C with every meal (3 times a day) the day prior to your procedure. . Antibiotics: Inform the staff if you have a condition or reason that requires you to take antibiotics before dental procedures. . Pregnancy: If you are pregnant, call and cancel the procedure. . Sickness: If you have a cold, fever, or any active infections, call and cancel the procedure. . Arrival: You must be in the facility at least 30 minutes prior to your scheduled procedure. . Children: Do not bring children with you. . Dress appropriately: Bring dark clothing that you would not mind if they get stained. . Valuables: Do not bring any jewelry or valuables.  Procedure  appointments are reserved for interventional treatments only. . No Prescription Refills. . No medication changes will be discussed during procedure appointments. . No disability issues will be discussed.  Reasons to call and reschedule or cancel your procedure: (Following these recommendations will minimize the risk of a serious complication.) . Surgeries: Avoid having procedures within 2 weeks of any surgery. (Avoid for 2 weeks before or after any surgery). . Flu Shots: Avoid having procedures within 2 weeks of a flu shots or . (Avoid for 2 weeks before or after immunizations). . Barium: Avoid having a procedure within 7-10 days after having had a radiological study involving the use of radiological contrast. (Myelograms, Barium swallow or enema study). . Heart attacks: Avoid any elective procedures or surgeries for the initial 6 months after a "Myocardial Infarction" (Heart Attack). . Blood thinners: It is imperative that you stop these medications before procedures. Let us know if you if you take any blood thinner.  . Infection: Avoid procedures during or within two weeks of an infection (including chest colds or gastrointestinal problems). Symptoms associated with infections include: Localized redness, fever, chills, night sweats or profuse sweating, burning sensation when voiding, cough, congestion, stuffiness, runny nose, sore throat, diarrhea, nausea, vomiting, cold or Flu symptoms, recent or current infections. It is specially important if the infection is over the area that we intend to treat. . Heart and lung problems: Symptoms that may suggest an active cardiopulmonary problem include: cough, chest pain, breathing difficulties or shortness of breath, dizziness, ankle swelling, uncontrolled high or unusually low blood pressure, and/or palpitations. If you are experiencing any of these symptoms, cancel   your procedure and contact your primary care physician for an evaluation.  Remember:   Regular Business hours are:  Monday to Thursday 8:00 AM to 4:00 PM  Provider's Schedule: Yitzchak Kothari, MD:  Procedure days: Tuesday and Thursday 7:30 AM to 4:00 PM  Bilal Lateef, MD:  Procedure days: Monday and Wednesday 7:30 AM to 4:00 PM ____________________________________________________________________________________________    ______________________________________________________________________________________________  Specialty Pain Scale  Introduction:  There are significant differences in how pain is reported. The word pain usually refers to physical pain, but it is also a common synonym of suffering. The medical community uses a scale from 0 (zero) to 10 (ten) to report pain level. Zero (0) is described as "no pain", while ten (10) is described as "the worse pain you can imagine". The problem with this scale is that physical pain is reported along with suffering. Suffering refers to mental pain, or more often yet it refers to any unpleasant feeling, emotion or aversion associated with the perception of harm or threat of harm. It is the psychological component of pain.  Pain Specialists prefer to separate the two components. The pain scale used by this practice is the Verbal Numerical Rating Scale (VNRS-11). This scale is for the physical pain only. DO NOT INCLUDE how your pain psychologically affects you. This scale is for adults 21 years of age and older. It has 11 (eleven) levels. The 1st level is 0/10. This means: "right now, I have no pain". In the context of pain management, it also means: "right now, my physical pain is under control with the current therapy".  General Information:  The scale should reflect your current level of pain. Unless you are specifically asked for the level of your worst pain, or your average pain. If you are asked for one of these two, then it should be understood that it is over the past 24 hours.  Levels 1 (one) through 5 (five) are  described below, and can be treated as an outpatient. Ambulatory pain management facilities such as ours are more than adequate to treat these levels. Levels 6 (six) through 10 (ten) are also described below, however, these must be treated as a hospitalized patient. While levels 6 (six) and 7 (seven) may be evaluated at an urgent care facility, levels 8 (eight) through 10 (ten) constitute medical emergencies and as such, they belong in a hospital's emergency department. When having these levels (as described below), do not come to our office. Our facility is not equipped to manage these levels. Go directly to an urgent care facility or an emergency department to be evaluated.  Definitions:  Activities of Daily Living (ADL): Activities of daily living (ADL or ADLs) is a term used in healthcare to refer to people's daily self-care activities. Health professionals often use a person's ability or inability to perform ADLs as a measurement of their functional status, particularly in regard to people post injury, with disabilities and the elderly. There are two ADL levels: Basic and Instrumental. Basic Activities of Daily Living (BADL  or BADLs) consist of self-care tasks that include: Bathing and showering; personal hygiene and grooming (including brushing/combing/styling hair); dressing; Toilet hygiene (getting to the toilet, cleaning oneself, and getting back up); eating and self-feeding (not including cooking or chewing and swallowing); functional mobility, often referred to as "transferring", as measured by the ability to walk, get in and out of bed, and get into and out of a chair; the broader definition (moving from one place to another while performing activities)   is useful for people with different physical abilities who are still able to get around independently. Basic ADLs include the things many people do when they get up in the morning and get ready to go out of the house: get out of bed, go to the  toilet, bathe, dress, groom, and eat. On the average, loss of function typically follows a particular order. Hygiene is the first to go, followed by loss of toilet use and locomotion. The last to go is the ability to eat. When there is only one remaining area in which the person is independent, there is a 62.9% chance that it is eating and only a 3.5% chance that it is hygiene. Instrumental Activities of Daily Living (IADL or IADLs) are not necessary for fundamental functioning, but they let an individual live independently in a community. IADL consist of tasks that include: cleaning and maintaining the house; home establishment and maintenance; care of others (including selecting and supervising caregivers); care of pets; child rearing; managing money; managing financials (investments, etc.); meal preparation and cleanup; shopping for groceries and necessities; moving within the community; safety procedures and emergency responses; health management and maintenance (taking prescribed medications); and using the telephone or other form of communication.  Instructions:  Most patients tend to report their pain as a combination of two factors, their physical pain and their psychosocial pain. This last one is also known as "suffering" and it is reflection of how physical pain affects you socially and psychologically. From now on, report them separately.  From this point on, when asked to report your pain level, report only your physical pain. Use the following table for reference.  Pain Clinic Pain Levels (0-5/10)  Pain Level Score  Description  No Pain 0   Mild pain 1 Nagging, annoying, but does not interfere with basic activities of daily living (ADL). Patients are able to eat, bathe, get dressed, toileting (being able to get on and off the toilet and perform personal hygiene functions), transfer (move in and out of bed or a chair without assistance), and maintain continence (able to control bladder and  bowel functions). Blood pressure and heart rate are unaffected. A normal heart rate for a healthy adult ranges from 60 to 100 bpm (beats per minute).   Mild to moderate pain 2 Noticeable and distracting. Impossible to hide from other people. More frequent flare-ups. Still possible to adapt and function close to normal. It can be very annoying and may have occasional stronger flare-ups. With discipline, patients may get used to it and adapt.   Moderate pain 3 Interferes significantly with activities of daily living (ADL). It becomes difficult to feed, bathe, get dressed, get on and off the toilet or to perform personal hygiene functions. Difficult to get in and out of bed or a chair without assistance. Very distracting. With effort, it can be ignored when deeply involved in activities.   Moderately severe pain 4 Impossible to ignore for more than a few minutes. With effort, patients may still be able to manage work or participate in some social activities. Very difficult to concentrate. Signs of autonomic nervous system discharge are evident: dilated pupils (mydriasis); mild sweating (diaphoresis); sleep interference. Heart rate becomes elevated (>115 bpm). Diastolic blood pressure (lower number) rises above 100 mmHg. Patients find relief in laying down and not moving.   Severe pain 5 Intense and extremely unpleasant. Associated with frowning face and frequent crying. Pain overwhelms the senses.  Ability to do any activity or maintain social   relationships becomes significantly limited. Conversation becomes difficult. Pacing back and forth is common, as getting into a comfortable position is nearly impossible. Pain wakes you up from deep sleep. Physical signs will be obvious: pupillary dilation; increased sweating; goosebumps; brisk reflexes; cold, clammy hands and feet; nausea, vomiting or dry heaves; loss of appetite; significant sleep disturbance with inability to fall asleep or to remain asleep. When  persistent, significant weight loss is observed due to the complete loss of appetite and sleep deprivation.  Blood pressure and heart rate becomes significantly elevated. Caution: If elevated blood pressure triggers a pounding headache associated with blurred vision, then the patient should immediately seek attention at an urgent or emergency care unit, as these may be signs of an impending stroke.    Emergency Department Pain Levels (6-10/10)  Emergency Room Pain 6 Severely limiting. Requires emergency care and should not be seen or managed at an outpatient pain management facility. Communication becomes difficult and requires great effort. Assistance to reach the emergency department may be required. Facial flushing and profuse sweating along with potentially dangerous increases in heart rate and blood pressure will be evident.   Distressing pain 7 Self-care is very difficult. Assistance is required to transport, or use restroom. Assistance to reach the emergency department will be required. Tasks requiring coordination, such as bathing and getting dressed become very difficult.   Disabling pain 8 Self-care is no longer possible. At this level, pain is disabling. The individual is unable to do even the most "basic" activities such as walking, eating, bathing, dressing, transferring to a bed, or toileting. Fine motor skills are lost. It is difficult to think clearly.   Incapacitating pain 9 Pain becomes incapacitating. Thought processing is no longer possible. Difficult to remember your own name. Control of movement and coordination are lost.   The worst pain imaginable 10 At this level, most patients pass out from pain. When this level is reached, collapse of the autonomic nervous system occurs, leading to a sudden drop in blood pressure and heart rate. This in turn results in a temporary and dramatic drop in blood flow to the brain, leading to a loss of consciousness. Fainting is one of the body's  self defense mechanisms. Passing out puts the brain in a calmed state and causes it to shut down for a while, in order to begin the healing process.    Summary: 1. Refer to this scale when providing us with your pain level. 2. Be accurate and careful when reporting your pain level. This will help with your care. 3. Over-reporting your pain level will lead to loss of credibility. 4. Even a level of 1/10 means that there is pain and will be treated at our facility. 5. High, inaccurate reporting will be documented as "Symptom Exaggeration", leading to loss of credibility and suspicions of possible secondary gains such as obtaining more narcotics, or wanting to appear disabled, for fraudulent reasons. 6. Only pain levels of 5 or below will be seen at our facility. 7. Pain levels of 6 and above will be sent to the Emergency Department and the appointment cancelled. ______________________________________________________________________________________________    

## 2018-07-18 ENCOUNTER — Other Ambulatory Visit: Payer: Self-pay | Admitting: Family Medicine

## 2018-07-18 DIAGNOSIS — E876 Hypokalemia: Secondary | ICD-10-CM

## 2018-07-19 ENCOUNTER — Other Ambulatory Visit: Payer: Self-pay

## 2018-07-19 MED ORDER — LINZESS 290 MCG PO CAPS: 290.0000 ug | ORAL_CAPSULE | Freq: Every day | ORAL | 3 refills | Status: DC

## 2018-07-19 NOTE — Telephone Encounter (Signed)
Patient called requesting refills. Thanks!  

## 2018-07-23 ENCOUNTER — Other Ambulatory Visit: Payer: Self-pay

## 2018-07-23 ENCOUNTER — Ambulatory Visit
Admission: RE | Admit: 2018-07-23 | Discharge: 2018-07-23 | Disposition: A | Discharge status: Home / Self Care | Payer: 59 | Source: Ambulatory Visit | Attending: Pain Medicine | Admitting: Pain Medicine

## 2018-07-23 ENCOUNTER — Ambulatory Visit (HOSPITAL_BASED_OUTPATIENT_CLINIC_OR_DEPARTMENT_OTHER): Payer: 59 | Admitting: Pain Medicine

## 2018-07-23 ENCOUNTER — Encounter: Payer: Self-pay | Admitting: Pain Medicine

## 2018-07-23 VITALS — BP 141/78 | HR 62 | Temp 97.3°F | Resp 18 | Ht 62.0 in | Wt 159.0 lb

## 2018-07-23 DIAGNOSIS — M5481 Occipital neuralgia: Secondary | ICD-10-CM | POA: Diagnosis not present

## 2018-07-23 DIAGNOSIS — G4486 Cervicogenic headache: Secondary | ICD-10-CM

## 2018-07-23 DIAGNOSIS — M5030 Other cervical disc degeneration, unspecified cervical region: Secondary | ICD-10-CM | POA: Insufficient documentation

## 2018-07-23 DIAGNOSIS — G8918 Other acute postprocedural pain: Secondary | ICD-10-CM | POA: Insufficient documentation

## 2018-07-23 DIAGNOSIS — M542 Cervicalgia: Secondary | ICD-10-CM

## 2018-07-23 DIAGNOSIS — R51 Headache: Secondary | ICD-10-CM | POA: Insufficient documentation

## 2018-07-23 DIAGNOSIS — M4310 Spondylolisthesis, site unspecified: Secondary | ICD-10-CM | POA: Diagnosis not present

## 2018-07-23 DIAGNOSIS — M47812 Spondylosis without myelopathy or radiculopathy, cervical region: Secondary | ICD-10-CM | POA: Diagnosis not present

## 2018-07-23 MED ORDER — LACTATED RINGERS IV SOLN
1000.0000 mL | Freq: Once | INTRAVENOUS | Status: AC
  Administered 2018-07-23: 1000 mL via INTRAVENOUS

## 2018-07-23 MED ORDER — FENTANYL CITRATE (PF) 100 MCG/2ML IJ SOLN
25.0000 ug | INTRAMUSCULAR | Status: DC | PRN
  Administered 2018-07-23: 100 ug via INTRAVENOUS
  Filled 2018-07-23: qty 2

## 2018-07-23 MED ORDER — OXYCODONE-ACETAMINOPHEN 5-325 MG PO TABS: 1.0000 | ORAL_TABLET | Freq: Four times a day (QID) | ORAL | 0 refills | Status: AC | PRN

## 2018-07-23 MED ORDER — DEXAMETHASONE SODIUM PHOSPHATE 10 MG/ML IJ SOLN
10.0000 mg | Freq: Once | INTRAMUSCULAR | Status: AC
  Administered 2018-07-23: 9 mg
  Filled 2018-07-23: qty 1

## 2018-07-23 MED ORDER — LIDOCAINE HCL 2 % IJ SOLN
20.0000 mL | Freq: Once | INTRAMUSCULAR | Status: AC
  Administered 2018-07-23: 400 mg
  Filled 2018-07-23: qty 40

## 2018-07-23 MED ORDER — ROPIVACAINE HCL 2 MG/ML IJ SOLN
9.0000 mL | Freq: Once | INTRAMUSCULAR | Status: AC
  Administered 2018-07-23: 9 mL via PERINEURAL
  Filled 2018-07-23: qty 10

## 2018-07-23 MED ORDER — MIDAZOLAM HCL 5 MG/5ML IJ SOLN
1.0000 mg | INTRAMUSCULAR | Status: DC | PRN
  Administered 2018-07-23: 4 mg via INTRAVENOUS
  Filled 2018-07-23: qty 5

## 2018-07-23 NOTE — Progress Notes (Signed)
Patient's Name: Elizabeth Bowers  MRN: [id number]  Referring Provider: Milinda Pointer, MD  DOB: 03-23-1966  PCP: Virginia Crews, MD  DOS: 07/23/2018  Note by: Gaspar Cola, MD  Service setting: Ambulatory outpatient  Specialty: Interventional Pain Management  Patient type: Established  Location: [facility] (AMB) Pain Management Facility  Visit type: Interventional Procedure   Primary Reason for Visit: Interventional Pain Management Treatment. CC: Neck Pain  Procedure:          Anesthesia, Analgesia, Anxiolysis:  Type: Cervical Facet, Medial Branch Radiofrequency Ablation  #1  Primary Purpose: Therapeutic Region: Posterolateral cervical spine region Level: C3, C4, C5, C6, & C7 Medial Branch Level(s). Lesioning of these levels should completely denervate the C3-4, C4-5, C5-6, and the C6-7 cervical facet joints. Laterality: Right Paraspinal  Type: Moderate (Conscious) Sedation combined with Local Anesthesia Indication(s): Analgesia and Anxiety Route: Intravenous (IV) IV Access: Secured Sedation: Meaningful verbal contact was maintained at all times during the procedure  Local Anesthetic: Lidocaine 1-2%  Position: Prone with head of the table was raised to facilitate breathing.   Indications: 1. Spondylosis without myelopathy or radiculopathy, cervical region   2. Cervical facet syndrome (Bilateral) (R>L)   3. Cervicalgia (Bilateral) (R>L)   4. Cervical Grade 1 (2 mm) Retrolisthesis C5 over C6   5. Cervicogenic headache (Bilateral)   6. Chronic occipital neuralgia (Bilateral)   7. DDD (degenerative disc disease), cervical    Elizabeth Bowers has been dealing with the above chronic pain for longer than three months and has either failed to respond, was unable to tolerate, or simply did not get enough benefit from other more conservative therapies including, but not limited to: 1. Over-the-counter medications 2. Anti-inflammatory medications 3. Muscle relaxants 4. Membrane  stabilizers 5. Opioids 6. Physical therapy and/or chiropractic manipulation 7. Modalities (Heat, ice, etc.) 8. Invasive techniques such as nerve blocks. Elizabeth Bowers has attained more than 50% relief of the pain from a series of diagnostic injections conducted in separate occasions.  Pain Score: Pre-procedure: 3 /10 Post-procedure: 0-No pain/10  Pre-op Assessment:  Elizabeth Bowers is a 53 y.o. (year old), female patient, seen today for interventional treatment. She  has a past surgical history that includes Elbow Debridement; Foot surgery; Cholecystectomy; Colonoscopy; Abdominal hysterectomy; and Esophagogastroduodenoscopy (egd) with propofol (N/A, 02/06/2018). Elizabeth Bowers has a current medication list which includes the following prescription(s): docusate sodium, duloxetine, lansoprazole, lansoprazole, linzess, multivitamin with minerals, potassium chloride sa, triamterene-hydrochlorothiazide, vitamin c, magnesium oxide, oxycodone-acetaminophen, and oxycodone-acetaminophen, and the following Facility-Administered Medications: fentanyl and midazolam. Her primarily concern today is the Neck Pain  Initial Vital Signs:  Pulse/HCG Rate: 62ECG Heart Rate: (!) 59 Temp: 98.8 F (37.1 C) Resp: 16 BP: 129/81 SpO2: 98 %  BMI: Estimated body mass index is 29.08 kg/m as calculated from the following:   Height as of this encounter: 5\' 2"  (1.575 m).   Weight as of this encounter: 159 lb (72.1 kg).  Risk Assessment: Allergies: Reviewed. She is allergic to ranitidine; amphetamine-dextroamphetamine; lactose intolerance (gi); reglan [metoclopramide]; topiramate er; wheat bran; and sulfa antibiotics.  Allergy Precautions: None required Coagulopathies: Reviewed. None identified.  Blood-thinner therapy: None at this time Active Infection(s): Reviewed. None identified. Elizabeth Bowers is afebrile  Site Confirmation: Elizabeth Bowers was asked to confirm the procedure and laterality before marking the site Procedure checklist:  Completed Consent: Before the procedure and under the influence of no sedative(s), amnesic(s), or anxiolytics, the patient was informed of the treatment options, risks and possible complications. To fulfill our ethical  and legal obligations, as recommended by the American Medical Association's Code of Ethics, I have informed the patient of my clinical impression; the nature and purpose of the treatment or procedure; the risks, benefits, and possible complications of the intervention; the alternatives, including doing nothing; the risk(s) and benefit(s) of the alternative treatment(s) or procedure(s); and the risk(s) and benefit(s) of doing nothing. The patient was provided information about the general risks and possible complications associated with the procedure. These may include, but are not limited to: failure to achieve desired goals, infection, bleeding, organ or nerve damage, allergic reactions, paralysis, and death. In addition, the patient was informed of those risks and complications associated to Spine-related procedures, such as failure to decrease pain; infection (i.e.: Meningitis, epidural or intraspinal abscess); bleeding (i.e.: epidural hematoma, subarachnoid hemorrhage, or any other type of intraspinal or peri-dural bleeding); organ or nerve damage (i.e.: Any type of peripheral nerve, nerve root, or spinal cord injury) with subsequent damage to sensory, motor, and/or autonomic systems, resulting in permanent pain, numbness, and/or weakness of one or several areas of the body; allergic reactions; (i.e.: anaphylactic reaction); and/or death. Furthermore, the patient was informed of those risks and complications associated with the medications. These include, but are not limited to: allergic reactions (i.e.: anaphylactic or anaphylactoid reaction(s)); adrenal axis suppression; blood sugar elevation that in diabetics may result in ketoacidosis or comma; water retention that in patients with history  of congestive heart failure may result in shortness of breath, pulmonary edema, and decompensation with resultant heart failure; weight gain; swelling or edema; medication-induced neural toxicity; particulate matter embolism and blood vessel occlusion with resultant organ, and/or nervous system infarction; and/or aseptic necrosis of one or more joints. Finally, the patient was informed that Medicine is not an exact science; therefore, there is also the possibility of unforeseen or unpredictable risks and/or possible complications that may result in a catastrophic outcome. The patient indicated having understood very clearly. We have given the patient no guarantees and we have made no promises. Enough time was given to the patient to ask questions, all of which were answered to the patient's satisfaction. Ms. Basso has indicated that she wanted to continue with the procedure. Attestation: I, the ordering provider, attest that I have discussed with the patient the benefits, risks, side-effects, alternatives, likelihood of achieving goals, and potential problems during recovery for the procedure that I have provided informed consent. Date  Time: 07/23/2018 10:10 AM  Pre-Procedure Preparation:  Monitoring: As per clinic protocol. Respiration, ETCO2, SpO2, BP, heart rate and rhythm monitor placed and checked for adequate function Safety Precautions: Patient was assessed for positional comfort and pressure points before starting the procedure. Time-out: I initiated and conducted the "Time-out" before starting the procedure, as per protocol. The patient was asked to participate by confirming the accuracy of the "Time Out" information. Verification of the correct person, site, and procedure were performed and confirmed by me, the nursing staff, and the patient. "Time-out" conducted as per Joint Commission's Universal Protocol (UP.01.01.01). Time: 1125  Description of Procedure:          Laterality: Right Level:  C3, C4, C5, C6, & C7 Medial Branch Level(s). Area Prepped: Entire Posterior Cervico-thoracic Region Prepping solution: ChloraPrep (2% chlorhexidine gluconate and 70% isopropyl alcohol) Safety Precautions: Aspiration looking for blood return was conducted prior to all injections. At no point did we inject any substances, as a needle was being advanced. Before injecting, the patient was told to immediately notify me if she was experiencing  any new onset of "ringing in the ears, or metallic taste in the mouth". No attempts were made at seeking any paresthesias. Safe injection practices and needle disposal techniques used. Medications properly checked for expiration dates. SDV (single dose vial) medications used. After the completion of the procedure, all disposable equipment used was discarded in the proper designated medical waste containers. Local Anesthesia: Protocol guidelines were followed. The patient was positioned over the fluoroscopy table. The area was prepped in the usual manner. The time-out was completed. The target area was identified using fluoroscopy. A 12-in long, straight, sterile hemostat was used with fluoroscopic guidance to locate the targets for each level blocked. Once located, the skin was marked with an approved surgical skin marker. Once all sites were marked, the skin (epidermis, dermis, and hypodermis), as well as deeper tissues (fat, connective tissue and muscle) were infiltrated with a small amount of a short-acting local anesthetic, loaded on a 10cc syringe with a 25G, 1.5-in  Needle. An appropriate amount of time was allowed for local anesthetics to take effect before proceeding to the next step. Local Anesthetic: Lidocaine 2.0% The unused portion of the local anesthetic was discarded in the proper designated containers. Technical explanation of process:  Radiofrequency Ablation (RFA) C3 Medial Branch Nerve RFA: The target area for the C3 dorsal medial articular branch is the  lateral concave waist of the articular pillar of C3. Under fluoroscopic guidance, a Radiofrequency needle was inserted until contact was made with os over the postero-lateral aspect of the articular pillar of C3 (target area). Sensory and motor testing was conducted to properly adjust the position of the needle. Once satisfactory placement of the needle was achieved, the numbing solution was slowly injected after negative aspiration for blood. 2.0 mL of the nerve block solution was injected without difficulty or complication. After waiting for at least 3 minutes, the ablation was performed. Once completed, the needle was removed intact. C4 Medial Branch Nerve RFA: The target area for the C4 dorsal medial articular branch is the lateral concave waist of the articular pillar of C4. Under fluoroscopic guidance, a Radiofrequency needle was inserted until contact was made with os over the postero-lateral aspect of the articular pillar of C4 (target area). Sensory and motor testing was conducted to properly adjust the position of the needle. Once satisfactory placement of the needle was achieved, the numbing solution was slowly injected after negative aspiration for blood. 2.0 mL of the nerve block solution was injected without difficulty or complication. After waiting for at least 3 minutes, the ablation was performed. Once completed, the needle was removed intact. C5 Medial Branch Nerve RFA: The target area for the C5 dorsal medial articular branch is the lateral concave waist of the articular pillar of C5. Under fluoroscopic guidance, a Radiofrequency needle was inserted until contact was made with os over the postero-lateral aspect of the articular pillar of C5 (target area). Sensory and motor testing was conducted to properly adjust the position of the needle. Once satisfactory placement of the needle was achieved, the numbing solution was slowly injected after negative aspiration for blood. 2.0 mL of the nerve  block solution was injected without difficulty or complication. After waiting for at least 3 minutes, the ablation was performed. Once completed, the needle was removed intact. C6 Medial Branch Nerve RFA: The target area for the C6 dorsal medial articular branch is the lateral concave waist of the articular pillar of C6. Under fluoroscopic guidance, a Radiofrequency needle was inserted until contact was  made with os over the postero-lateral aspect of the articular pillar of C6 (target area). Sensory and motor testing was conducted to properly adjust the position of the needle. Once satisfactory placement of the needle was achieved, the numbing solution was slowly injected after negative aspiration for blood. 2.0 mL of the nerve block solution was injected without difficulty or complication. After waiting for at least 3 minutes, the ablation was performed. Once completed, the needle was removed intact. C7 Medial Branch Nerve RFA: The target for the C7 dorsal medial articular branch lies on the superior-medial tip of the C7 transverse process. Under fluoroscopic guidance, a Radiofrequency needle was inserted until contact was made with os over the postero-lateral aspect of the articular pillar of C7 (target area). Sensory and motor testing was conducted to properly adjust the position of the needle. Once satisfactory placement of the needle was achieved, the numbing solution was slowly injected after negative aspiration for blood. 2.0 mL of the nerve block solution was injected without difficulty or complication. After waiting for at least 3 minutes, the ablation was performed. Once completed, the needle was removed intact. Radiofrequency lesioning (ablation):  Radiofrequency Generator: NeuroTherm [id number] Sensory Stimulation Parameters: 50 Hz was used to locate & identify the nerve, making sure that the needle was positioned such that there was no sensory stimulation below 0.3 V or above 0.7 V. Motor Stimulation  Parameters: 2 Hz was used to evaluate the motor component. Care was taken not to lesion any nerves that demonstrated motor stimulation of the lower extremities at an output of less than 2.5 times that of the sensory threshold, or a maximum of 2.0 V. Lesioning Technique Parameters: Standard Radiofrequency settings. (Not bipolar or pulsed.) Temperature Settings: 80 degrees C Lesioning time: 60 seconds Intra-operative Compliance: Compliant Materials & Medications: Needle(s) (Electrode/Cannula) Type: Teflon-coated, curved tip, Radiofrequency needle(s) Gauge: 22G Length: 10cm Numbing solution: 0.2% PF-Ropivacaine + Triamcinolone (40 mg/mL) diluted to a final concentration of 4 mg of Triamcinolone/mL of Ropivacaine The unused portion of the solution was discarded in the proper designated containers.  Once the entire procedure was completed, the treated area was cleaned, making sure to leave some of the prepping solution back to take advantage of its long term bactericidal properties.  Intra-operative Compliance: Compliant  Vitals:   07/23/18 1159 07/23/18 1209 07/23/18 1219 07/23/18 1229  BP: (!) 146/88 134/82 (!) 148/88 (!) 141/78  Pulse:      Resp: 13 17 17 18   Temp:  (!) 97.4 F (36.3 C)  (!) 97.3 F (36.3 C)  TempSrc:      SpO2: 99% 100% 100% 100%  Weight:      Height:        Start Time: 1125 hrs. End Time: 1159 hrs.  Imaging Guidance (Spinal):          Type of Imaging Technique: Fluoroscopy Guidance (Spinal) Indication(s): Assistance in needle guidance and placement for procedures requiring needle placement in or near specific anatomical locations not easily accessible without such assistance. Exposure Time: Please see nurses notes. Contrast: None used. Fluoroscopic Guidance: I was personally present during the use of fluoroscopy. "Tunnel Vision Technique" used to obtain the best possible view of the target area. Parallax error corrected before commencing the procedure.  "Direction-depth-direction" technique used to introduce the needle under continuous pulsed fluoroscopy. Once target was reached, antero-posterior, oblique, and lateral fluoroscopic projection used confirm needle placement in all planes. Images permanently stored in EMR. Interpretation: No contrast injected. I personally interpreted the imaging intraoperatively. Adequate  needle placement confirmed in multiple planes. Permanent images saved into the patient's record.  Antibiotic Prophylaxis:   Anti-infectives (From admission, onward)   None     Indication(s): None identified  Post-operative Assessment:  Post-procedure Vital Signs:  Pulse/HCG Rate: 62(!) 53 Temp: (!) 97.3 F (36.3 C) Resp: 18 BP: (!) 141/78 SpO2: 100 %  EBL: None  Complications: No immediate post-treatment complications observed by team, or reported by patient.  Note: The patient tolerated the entire procedure well. A repeat set of vitals were taken after the procedure and the patient was kept under observation following institutional policy, for this type of procedure. Post-procedural neurological assessment was performed, showing return to baseline, prior to discharge. The patient was provided with post-procedure discharge instructions, including a section on how to identify potential problems. Should any problems arise concerning this procedure, the patient was given instructions to immediately contact us, at any time, without hesitation. In any case, we plan to contact the patient by telephone for a follow-up status report regarding this interventional procedure.  Comments:  No additional relevant information.  Plan of Care  Interventional management options: Planned, scheduled, and/or pending:   Therapeutic left-sided cervical facet RFA #1under fluoroscopic guidance and IV sedation in approximately 2 weeks. The patient may decide to first do a bilateral lumbar facet block #2, depending on how she feels.    Considering:   Possible bilateral cervical facet RFA#1 Diagnostic bilateral lumbar facet block #2 Possible bilateral lumbar facet RFA#1 Diagnostic right-sided sacroiliac joint block Possible right-sided sacroiliac joint RFA Diagnostic bilateral intra-articular hip joint injection with local anesthetic and steroid Diagnostic bilateral femoral nerve block + obturator nerve block Possible bilateral femoral nerve + obturator nerve RFA Diagnostic bilateral L5 transforaminal ESI Diagnostic bilateral intra-articular knee joint injection with local anesthetic and steroid Therapeutic series of 5 intra-articular bilateral Hyalgan knee injections Diagnostic bilateral genicular nerve block Possible bilateral genicular nerve RFA   Palliative PRN treatment(s):   Palliativebilateral cervical facet blocks Diagnosticbilaterallumbarfacet block#2 Diagnostic right-sided interlaminar Cervical ESI #2     Imaging Orders     DG C-Arm 1-60 Min-No Report  Procedure Orders     Radiofrequency,Cervical     Radiofrequency,Cervical  Medications ordered for procedure: Meds ordered this encounter  Medications  . lidocaine (XYLOCAINE) 2 % (with pres) injection 400 mg  . midazolam (VERSED) 5 MG/5ML injection 1-2 mg    Make sure Flumazenil is available in the pyxis when using this medication. If oversedation occurs, administer 0.2 mg IV over 15 sec. If after 45 sec no response, administer 0.2 mg again over 1 min; may repeat at 1 min intervals; not to exceed 4 doses (1 mg)  . fentaNYL (SUBLIMAZE) injection 25-50 mcg    Make sure Narcan is available in the pyxis when using this medication. In the event of respiratory depression (RR< 8/min): Titrate NARCAN (naloxone) in increments of 0.1 to 0.2 mg IV at 2-3 minute intervals, until desired degree of reversal.  . lactated ringers infusion 1,000 mL  . ropivacaine (PF) 2 mg/mL (0.2%) (NAROPIN) injection 9 mL  . dexamethasone (DECADRON)  injection 10 mg  . oxyCODONE-acetaminophen (PERCOCET) 5-325 MG tablet    Sig: Take 1 tablet by mouth every 6 (six) hours as needed for up to 7 days for severe pain. Must last 7 days.    Dispense:  28 tablet    Refill:  0    For acute post-operative pain. Not to be refilled. Most last 7 days.  Marland Kitchen oxyCODONE-acetaminophen (PERCOCET) 5-325 MG  tablet    Sig: Take 1 tablet by mouth every 6 (six) hours as needed for up to 7 days for severe pain. Must last 7 days.    Dispense:  28 tablet    Refill:  0    For acute post-operative pain. Not to be refilled. Must last 7 days.   Medications administered: We administered lidocaine, midazolam, fentaNYL, lactated ringers, ropivacaine (PF) 2 mg/mL (0.2%), and dexamethasone.  See the medical record for exact dosing, route, and time of administration.  Disposition: Discharge home  Discharge Date & Time: 07/23/2018; 1233 hrs.   Physician-requested Follow-up: Return for contralateral RFA (2 wks): (L) C-FCT RFA #1.  Future Appointments  Date Time Provider [place]  07/23/2018  2:15 PM Milinda Pointer, MD [facility] None  08/01/2018  1:15 PM Lin Landsman, MD [facility] None  08/08/2018  8:00 AM Milinda Pointer, MD [facility] None  08/15/2018 10:30 AM Milinda Pointer, MD [facility] None  08/21/2018  8:40 AM Brita Romp, [name], MD [facility] None   Primary Care Physician: Virginia Crews, MD Location: [facility] Note by: Gaspar Cola, MD Date: 07/23/2018; Time: 12:43 PM  Disclaimer:  Medicine is not an Chief Strategy Officer. The only guarantee in medicine is that nothing is guaranteed. It is important to note that the decision to proceed with this intervention was based on the information collected from the patient. The Data and conclusions were drawn from the patient's questionnaire, the interview, and the physical examination. Because the information was provided in large part by the patient, it cannot be  guaranteed that it has not been purposely or unconsciously manipulated. Every effort has been made to obtain as much relevant data as possible for this evaluation. It is important to note that the conclusions that lead to this procedure are derived in large part from the available data. Always take into account that the treatment will also be dependent on availability of resources and existing treatment guidelines, considered by other Pain Management Practitioners as being common knowledge and practice, at the time of the intervention. For Medico-Legal purposes, it is also important to point out that variation in procedural techniques and pharmacological choices are the acceptable norm. The indications, contraindications, technique, and results of the above procedure should only be interpreted and judged by a Board-Certified Interventional Pain Specialist with extensive familiarity and expertise in the same exact procedure and technique.

## 2018-07-23 NOTE — Patient Instructions (Signed)
___________________________________________________________________________________________  Post-Radiofrequency (RF) Discharge Instructions  You have just completed a Radiofrequency Neurotomy.  The following instructions will provide you with information and guidelines for self-care upon discharge.  If at any time you have questions or concerns please call your physician. DO NOT DRIVE YOURSELF!!  Instructions:  Apply ice: Fill a plastic sandwich bag with crushed ice. Cover it with a small towel and apply to injection site. Apply for 15 minutes then remove x 15 minutes. Repeat sequence on day of procedure, until you go to bed. The purpose is to minimize swelling and discomfort after procedure.  Apply heat: Apply heat to procedure site starting the day following the procedure. The purpose is to treat any soreness and discomfort from the procedure.  Food intake: No eating limitations, unless stipulated above.  Nevertheless, if you have had sedation, you may experience some nausea.  In this case, it may be wise to wait at least two hours prior to resuming regular diet.  Physical activities: Keep activities to a minimum for the first 8 hours after the procedure. For the first 24 hours after the procedure, do not drive a motor vehicle,  Operate heavy machinery, power tools, or handle any weapons.  Consider walking with the use of an assistive device or accompanied by an adult for the first 24 hours.  Do not drink alcoholic beverages including beer.  Do not make any important decisions or sign any legal documents. Go home and rest today.  Resume activities tomorrow, as tolerated.  Use caution in moving about as you may experience mild leg weakness.  Use caution in cooking, use of household electrical appliances and climbing steps.  Driving: If you have received any sedation, you are not allowed to drive for 24 hours after your procedure.  Blood thinner: Restart your blood thinner 6 hours after your  procedure. (Only for those taking blood thinners)  Insulin: As soon as you can eat, you may resume your normal dosing schedule. (Only for those taking insulin)  Medications: May resume pre-procedure medications.  Do not take any drugs, other than what has been prescribed to you.  Infection prevention: Keep procedure site clean and dry.  Post-procedure Pain Diary: Extremely important that this be done correctly and accurately. Recorded information will be used to determine the next step in treatment.  Pain evaluated is that of treated area only. Do not include pain from an untreated area.  Complete every hour, on the hour, for the initial 8 hours. Set an alarm to help you do this part accurately.  Do not go to sleep and have it completed later. It will not be accurate.  Follow-up appointment: Keep your follow-up appointment after the procedure. Usually 2 weeks for most procedures. (6 weeks in the case of radiofrequency.) Bring you pain diary.   Expect:  From numbing medicine (AKA: Local Anesthetics): Numbness or decrease in pain.  Onset: Full effect within 15 minutes of injected.  Duration: It will depend on the type of local anesthetic used. On the average, 1 to 8 hours.   From steroids: Decrease in swelling or inflammation. Once inflammation is improved, relief of the pain will follow.  Onset of benefits: Depends on the amount of swelling present. The more swelling, the longer it will take for the benefits to be seen. In some cases, up to 10 days.  Duration: Steroids will stay in the system x 2 weeks. Duration of benefits will depend on multiple posibilities including persistent irritating factors.  From procedure: Some   discomfort is to be expected once the numbing medicine wears off. This should be minimal if ice and heat are applied as instructed.  Call if:  You experience numbness and weakness that gets worse with time, as opposed to wearing off.  He experience any unusual  bleeding, difficulty breathing, or loss of the ability to control your bowel and bladder. (This applies to Spinal procedures only)  You experience any redness, swelling, heat, red streaks, elevated temperature, fever, or any other signs of a possible infection.  Emergency Numbers:  Durning business hours (Monday - Thursday, 8:00 AM - 4:00 PM) (Friday, 9:00 AM - 12:00 Noon): [phone]  After hours: [phone] ____________________________________________________________________________________________   ____________________________________________________________________________________________  Preparing for Procedure with Sedation  Instructions: . Oral Intake: Do not eat or drink anything for at least 8 hours prior to your procedure. . Transportation: Public transportation is not allowed. Bring an adult driver. The driver must be physically present in our waiting room before any procedure can be started. . Physical Assistance: Bring an adult physically capable of assisting you, in the event you need help. This adult should keep you company at home for at least 6 hours after the procedure. . Blood Pressure Medicine: Take your blood pressure medicine with a sip of water the morning of the procedure. . Blood thinners: Notify our staff if you are taking any blood thinners. Depending on which one you take, there will be specific instructions on how and when to stop it. . Diabetics on insulin: Notify the staff so that you can be scheduled 1st case in the morning. If your diabetes requires high dose insulin, take only  of your normal insulin dose the morning of the procedure and notify the staff that you have done so. . Preventing infections: Shower with an antibacterial soap the morning of your procedure. . Build-up your immune system: Take 1000 mg of Vitamin C with every meal (3 times a day) the day prior to your procedure. . Antibiotics: Inform the staff if you have a condition or  reason that requires you to take antibiotics before dental procedures. . Pregnancy: If you are pregnant, call and cancel the procedure. . Sickness: If you have a cold, fever, or any active infections, call and cancel the procedure. . Arrival: You must be in the facility at least 30 minutes prior to your scheduled procedure. . Children: Do not bring children with you. . Dress appropriately: Bring dark clothing that you would not mind if they get stained. . Valuables: Do not bring any jewelry or valuables.  Procedure appointments are reserved for interventional treatments only. . No Prescription Refills. . No medication changes will be discussed during procedure appointments. . No disability issues will be discussed.  Reasons to call and reschedule or cancel your procedure: (Following these recommendations will minimize the risk of a serious complication.) . Surgeries: Avoid having procedures within 2 weeks of any surgery. (Avoid for 2 weeks before or after any surgery). . Flu Shots: Avoid having procedures within 2 weeks of a flu shots or . (Avoid for 2 weeks before or after immunizations). . Barium: Avoid having a procedure within 7-10 days after having had a radiological study involving the use of radiological contrast. (Myelograms, Barium swallow or enema study). . Heart attacks: Avoid any elective procedures or surgeries for the initial 6 months after a "Myocardial Infarction" (Heart Attack). . Blood thinners: It is imperative that you stop these medications before procedures. Let us know if you if you   take any blood thinner.  . Infection: Avoid procedures during or within two weeks of an infection (including chest colds or gastrointestinal problems). Symptoms associated with infections include: Localized redness, fever, chills, night sweats or profuse sweating, burning sensation when voiding, cough, congestion, stuffiness, runny nose, sore throat, diarrhea, nausea, vomiting, cold or Flu  symptoms, recent or current infections. It is specially important if the infection is over the area that we intend to treat. . Heart and lung problems: Symptoms that may suggest an active cardiopulmonary problem include: cough, chest pain, breathing difficulties or shortness of breath, dizziness, ankle swelling, uncontrolled high or unusually low blood pressure, and/or palpitations. If you are experiencing any of these symptoms, cancel your procedure and contact your primary care physician for an evaluation.  Remember:  Regular Business hours are:  Monday to Thursday 8:00 AM to 4:00 PM  Provider's Schedule: Erna Brossard, MD:  Procedure days: Tuesday and Thursday 7:30 AM to 4:00 PM  Bilal Lateef, MD:  Procedure days: Monday and Wednesday 7:30 AM to 4:00 PM ____________________________________________________________________________________________    

## 2018-07-24 ENCOUNTER — Telehealth: Payer: Self-pay | Admitting: *Deleted

## 2018-07-24 NOTE — Telephone Encounter (Signed)
Attempted to call for post procedure follow-up 

## 2018-07-30 ENCOUNTER — Telehealth: Payer: Self-pay | Admitting: *Deleted

## 2018-07-30 NOTE — Telephone Encounter (Signed)
Attempted to return patient call. Left message.

## 2018-07-30 NOTE — Telephone Encounter (Signed)
Complaining of headache since the procedure. Denies fever, N/V, or altered mental status. Advised to to go ED if any of these symptoms occur. Advised to apply heat to injection site.

## 2018-08-01 ENCOUNTER — Encounter: Payer: Self-pay | Admitting: Gastroenterology

## 2018-08-01 ENCOUNTER — Ambulatory Visit (INDEPENDENT_AMBULATORY_CARE_PROVIDER_SITE_OTHER): Payer: 59 | Admitting: Gastroenterology

## 2018-08-01 ENCOUNTER — Other Ambulatory Visit: Payer: Self-pay

## 2018-08-01 VITALS — BP 133/84 | HR 73 | Resp 17 | Ht 62.0 in | Wt 165.6 lb

## 2018-08-01 DIAGNOSIS — K30 Functional dyspepsia: Secondary | ICD-10-CM | POA: Diagnosis not present

## 2018-08-01 DIAGNOSIS — K219 Gastro-esophageal reflux disease without esophagitis: Secondary | ICD-10-CM | POA: Diagnosis not present

## 2018-08-01 DIAGNOSIS — R7989 Other specified abnormal findings of blood chemistry: Secondary | ICD-10-CM

## 2018-08-01 DIAGNOSIS — K5909 Other constipation: Secondary | ICD-10-CM

## 2018-08-01 NOTE — Progress Notes (Signed)
Elizabeth Darby, MD [address]  [place]  [place], [place] [address]  Main: [phone]  Fax: [phone]    Gastroenterology Consultation  Referring Provider:     Virginia Crews, MD Primary Care Physician:  Virginia Crews, MD Primary Gastroenterologist:  Dr. Cephas Bowers Reason for Consultation:     GERD, dyspepsia and constipation        HPI:   Elizabeth Bowers is a 53 y.o. female referred by Dr. Brita Romp, [name], MD  for consultation & management of chronic GERD, bloating, epigastric pain and constipation  Chronic GERD: with erosive esophagitis Patient has been suffering from acid reflux for several years. She tried maximum doses of Prevacid, Protonix, Nexium, Prilosec and zegerid. When she found out about erosive esophagitis, she switched to Dexilant 60 mg twice daily which helped in healing of erosive esophagitis confirmed by EGD in 2018. She moved to [place] and changed her health insurance, therefore here to establish care with new GI. She is currently employed as a Statistician at [employer]. She reports that taking Dexilant 60 mg twice daily is keeping her symptoms under control.  Epigastric pain and bloating: these symptoms started when she turned 53 years old, around menopause. She figured that avoiding milk and wheat helps with bloating significantly. She tells me that whenever she eats a piece of bread or drinks milk, leads to severe bloating and upper abdominal discomfort that she doubles over from pain. She has eliminated wheat and diary as much as she could from her routine. It is unclear whether she was tested for celiac disease or H. Pylori infection although patient denies. She consumes red meat about 2 times a week, drinks Dr. Malachi Bonds 1-2 drinks daily to keep her up during night shifts. She does have ongoing mild epigastric discomfort and bloating  Chronic constipation: she is currently managed with linaclotide  290 MCG daily and takes Colace additionally as needed. She thinks this medication is fairly keeping the constipation regulated. TSH normal. She reports that she gained significant weight in the process of trying different medications for fibromyalgia  Follow-up visit 12/26/2017 She started taking Topamax and phentermine for weight loss and lost about 10 pounds. She used to take this medication every day which resulted in rapid weight loss. Currently, taking once a week or so and losing about 1 pound per week. She continues to have symptoms of significant bloating, frequent burping, gas when she consumes even smallest amount of wheat. She tried to reintroduce a slice of bread few times a week but her symptoms got worse. she tried FD gaurd with no benefit, She is currently on Prevacid 30 mg twice daily which provides modest relief of heartburn and regurgitation. Her constipation is fairly regulated on linaclotide and Colace. She works as a Statistician at [employer], used to do night shifts. Currently, she'll be working during the day only. She has cut back on drinking Dr. Malachi Bonds. Her symptoms of fibromyalgia are fairly controlled on Cymbalta. She also has history of migraine headaches  Follow-up visit 08/01/2018 She underwent EGD which was unremarkable including esophageal, gastric and duodenal biopsies.  She is currently taking Prevacid 30 mg twice daily.  She is following low FODMAPs diet and has seen significant improvement in her symptoms of dyspepsia and GERD.  She actually lost about 10 pounds which she regained back.  Overall, she thinks are manageable.  She is also taking Linzess 290 MCG daily along  with Benefiber which is regulating her constipation.  She is seeing Dr. Stefani Dama for rising creatinine  NSAIDs: none  Antiplts/Anticoagulants/Anti thrombotics: none Family history of colon cancer Her sister passed away from colon cancer at age 39 Maternal uncles had colon  cancer  GI Procedures:  EGD and colonoscopy 12/14/16 at [facility] Procedure report not available A.  GE junction, endoscopic biopsy: Squamocolumnar junction mucosa with no significant pathologic finding. Negative for goblet cell intestinal metaplasia.  B.  Cecal polyp, endoscopic polypectomy: Sessile serrated adenoma. Negative for conventional dysplasia.  Past Medical History:  Diagnosis Date  . Fatty liver   . Fibromyalgia   . GERD (gastroesophageal reflux disease)   . Hemorrhoids   . Hot flashes   . Hypertension   . IBS (irritable bowel syndrome)   . LUQ abdominal pain 10/25/2016  . Migraines   . Miscarriage    x 2; 4 live births   . Multilevel degenerative disc disease   . Vocal cord dysfunction     Past Surgical History:  Procedure Laterality Date  . ABDOMINAL HYSTERECTOMY     2012 removed cervix h/o abnormal pap   . CHOLECYSTECTOMY     2000  . COLONOSCOPY     2018 with + polpys and GIB 2/2 polyp removal   . ELBOW DEBRIDEMENT    . ESOPHAGOGASTRODUODENOSCOPY (EGD) WITH PROPOFOL N/A 02/06/2018   Procedure: ESOPHAGOGASTRODUODENOSCOPY (EGD) WITH PROPOFOL with biopsies;  Surgeon: Lin Landsman, MD;  Location: [facility];  Service: Endoscopy;  Laterality: N/A;  . FOOT SURGERY      Current Outpatient Medications:  .  docusate sodium (COLACE) 100 MG capsule, Take 200 mg by mouth at bedtime. , Disp: , Rfl:  .  DULoxetine (CYMBALTA) 60 MG capsule, TAKE 1 CAPSULE (60 MG TOTAL) BY MOUTH DAILY., Disp: 30 capsule, Rfl: 11 .  lansoprazole (PREVACID) 30 MG capsule, TAKE 1 CAPSULE BY MOUTH TWICE DAILY, Disp: 60 capsule, Rfl: 1 .  LINZESS 290 MCG CAPS capsule, Take 1 capsule (290 mcg total) by mouth daily., Disp: 90 capsule, Rfl: 3 .  Multiple Vitamin (MULTIVITAMIN WITH MINERALS) TABS tablet, Take 1 tablet by mouth daily., Disp: , Rfl:  .  oxyCODONE-acetaminophen (PERCOCET) 5-325 MG tablet, Take 1 tablet by mouth every 6 (six) hours as needed for up to 7 days for severe pain.  Must last 7 days., Disp: 28 tablet, Rfl: 0 .  potassium chloride SA (K-DUR,KLOR-CON) 20 MEQ tablet, TAKE 1 TABLET BY MOUTH DAILY., Disp: 30 tablet, Rfl: 5 .  triamterene-hydrochlorothiazide (MAXZIDE-25) 37.5-25 MG tablet, Take 1 tablet by mouth daily., Disp: 90 tablet, Rfl: 3 .  vitamin C (ASCORBIC ACID) 500 MG tablet, Take 500 mg by mouth daily., Disp: , Rfl:  .  lansoprazole (PREVACID) 30 MG capsule, Take 1 capsule (30 mg total) by mouth 2 (two) times daily before a meal. (Patient not taking: Reported on 08/01/2018), Disp: 180 capsule, Rfl: 0 .  Magnesium Oxide 500 MG CAPS, Take 1 capsule (500 mg total) by mouth at bedtime as needed and may repeat dose one time if needed., Disp: 90 capsule, Rfl: 0   Family History  Problem Relation Age of Onset  . Hypertension Mother   . Hyperlipidemia Mother   . Glaucoma Mother   . Diabetes Mother        type 2   . COPD Father        emphysema  . Alcohol abuse Father   . Colon cancer Sister   . Cancer Sister  rectal/colon cancer no h/o IBD  . Colon polyps Sister   . Asthma Sister   . COPD Sister   . Kidney disease Sister   . Mental illness Sister        bipolar   . Miscarriages / Stillbirths Sister   . Uterine cancer Sister   . Alcohol abuse Son   . Depression Son   . Diabetes Son   . Drug abuse Son   . Mental illness Son        bipolar   . Diabetes Maternal Grandmother   . COPD Paternal Grandmother   . Cancer Paternal Grandfather        FH colon cancer maternal aunts/uncles   . Miscarriages / Stillbirths Sister   . Arthritis Son   . Arthritis Son   . Breast cancer Neg Hx      Social History   Tobacco Use  . Smoking status: Former Smoker    Packs/day: 0.75    Years: 5.00    Pack years: 3.75    Types: Cigarettes    Last attempt to quit: 07/06/1987    Years since quitting: 31.0  . Smokeless tobacco: Never Used  Substance Use Topics  . Alcohol use: Yes    Alcohol/week: 2.0 - 4.0 standard drinks    Types: 2 - 4 Cans of  beer per week    Comment: wine cooler  . Drug use: Never    Allergies as of 08/01/2018 - Review Complete 08/01/2018  Allergen Reaction Noted  . Ranitidine Diarrhea and Other (See Comments) 08/30/2016  . Amphetamine-dextroamphetamine Other (See Comments) 05/10/2016  . Lactose intolerance (gi)  02/05/2018  . Reglan [metoclopramide] Other (See Comments) 12/15/2016  . Topiramate er Other (See Comments) 01/16/2018  . Wheat bran  02/05/2018  . Sulfa antibiotics Anxiety 12/15/2016    Review of Systems:    All systems reviewed and negative except where noted in HPI.   Physical Exam:  BP 133/84 (BP Location: Left Arm, Patient Position: Sitting, Cuff Size: Large)   Pulse 73   Resp 17   Ht 5\' 2"  (1.575 m)   Wt 165 lb 9.6 oz (75.1 kg)   BMI 30.29 kg/m  No LMP recorded. Patient has had a hysterectomy.  General:   Alert,  Well-developed, well-nourished, pleasant and cooperative in NAD Head:  Normocephalic and atraumatic. Eyes:  Sclera clear, no icterus.   Conjunctiva pink. Ears:  Normal auditory acuity. Nose:  No deformity, discharge, or lesions. Mouth:  No deformity or lesions,oropharynx pink & moist. Neck:  Supple; no masses or thyromegaly. Lungs:  Respirations even and unlabored.  Clear throughout to auscultation.   No wheezes, crackles, or rhonchi. No acute distress. Heart:  Regular rate and rhythm; no murmurs, clicks, rubs, or gallops. Abdomen:  Normal bowel sounds. Soft, nontender, mildly distended from bloating without masses, hepatosplenomegaly or hernias noted.  No guarding or rebound tenderness.   Rectal: Not performed Msk:  Symmetrical without gross deformities. Good, equal movement & strength bilaterally. Pulses:  Normal pulses noted. Extremities:  No clubbing or edema.  No cyanosis. Neurologic:  Alert and oriented x3;  grossly normal neurologically. Skin:  Intact without significant lesions or rashes. No jaundice. Psych:  Alert and cooperative. Normal mood and  affect.  Imaging Studies: reviewed  Assessment and Plan:   Anielle Jennavieve Arrick is a 53 y.o. Caucasian female with family history of colon cancer in first-degree relative, chronic GERD with erosive esophagitis, chronic dyspepsia and constipation. With her background history of fibromyalgia,  migraines, anxiety her GI symptoms particularly symptoms of dyspepsia are probably functional  Chronic GERD Dexilant 60 mg twice daily is only medication that has resulted in resolution of her esophagitis Patient switched from [place] to Prevacid 30 mg twice daily with modest improvement in symptoms. EGD with no evidence of eosinophilic esophagitis She also reports that she had pH impedance study performed several years ago and she was told that she is not a candidate for antireflux surgery.  She would like to defer repeat testing at this time  Chronic functional dyspepsia: There was no evidence of H. Pylori or celiac disease based on EGD in 09/2014 -We will perform food allergy profile, alpha gal panel -Continue low FODMAPS diet  Chronic constipation: - continue linaclotide at the current dose with Colace as needed - High fiber diet and fiber supplements  Colon cancer in first-degree relative < 35 years of age - Last colonoscopy in 12/2016 revealed sessile serrated adenoma in the cecum which was 1 cm in size - Recommend repeat colonoscopy in 12/2019  CKD Recheck BMP today Follow-up with nephrology   Follow up in 6 months  Elizabeth Darby, MD

## 2018-08-02 LAB — BASIC METABOLIC PANEL
BUN/Creatinine Ratio: 12 (ref 9–23)
BUN: 12 mg/dL (ref 6–24)
CO2: 27 mmol/L (ref 20–29)
Calcium: 9.7 mg/dL (ref 8.7–10.2)
Chloride: 100 mmol/L (ref 96–106)
Creatinine, Ser: 1.02 mg/dL — ABNORMAL HIGH (ref 0.57–1.00)
GFR calc Af Amer: 73 mL/min/{1.73_m2} (ref >59)
GFR calc non Af Amer: 63 mL/min/{1.73_m2} (ref >59)
Glucose: 68 mg/dL (ref 65–99)
POTASSIUM: 4.1 mmol/L (ref 3.5–5.2)
Sodium: 141 mmol/L (ref 134–144)

## 2018-08-06 LAB — FOOD ALLERGY PROFILE
Allergen Corn, IgE: <0.1 kU/L
Clam IgE: <0.1 kU/L
Codfish IgE: <0.1 kU/L
Egg White IgE: <0.1 kU/L
Milk IgE: <0.1 kU/L
Peanut IgE: <0.1 kU/L
Scallop IgE: <0.1 kU/L
Shrimp IgE: <0.1 kU/L
Soybean IgE: <0.1 kU/L
Walnut IgE: <0.1 kU/L

## 2018-08-06 LAB — ALPHA-GAL PANEL
Alpha Gal IgE*: <0.1 kU/L (ref <0.10)
Class Interpretation: 0
Class Interpretation: 0
Class Interpretation: 0
Lamb/Mutton (Ovis spp) IgE: <0.1 kU/L (ref <0.35)
Pork (Sus spp) IgE: <0.1 kU/L (ref <0.35)

## 2018-08-08 ENCOUNTER — Encounter: Payer: Self-pay | Admitting: Pain Medicine

## 2018-08-08 ENCOUNTER — Ambulatory Visit
Admission: RE | Admit: 2018-08-08 | Discharge: 2018-08-08 | Disposition: A | Discharge status: Home / Self Care | Payer: 59 | Source: Ambulatory Visit | Attending: Pain Medicine | Admitting: Pain Medicine

## 2018-08-08 ENCOUNTER — Ambulatory Visit (HOSPITAL_BASED_OUTPATIENT_CLINIC_OR_DEPARTMENT_OTHER): Payer: 59 | Admitting: Pain Medicine

## 2018-08-08 ENCOUNTER — Other Ambulatory Visit: Payer: Self-pay

## 2018-08-08 VITALS — BP 137/81 | HR 61 | Temp 98.3°F | Resp 12 | Ht 62.0 in | Wt 159.0 lb

## 2018-08-08 DIAGNOSIS — M5136 Other intervertebral disc degeneration, lumbar region: Secondary | ICD-10-CM | POA: Diagnosis not present

## 2018-08-08 DIAGNOSIS — M47812 Spondylosis without myelopathy or radiculopathy, cervical region: Secondary | ICD-10-CM | POA: Diagnosis not present

## 2018-08-08 DIAGNOSIS — M47816 Spondylosis without myelopathy or radiculopathy, lumbar region: Secondary | ICD-10-CM | POA: Insufficient documentation

## 2018-08-08 DIAGNOSIS — G8929 Other chronic pain: Secondary | ICD-10-CM | POA: Diagnosis not present

## 2018-08-08 DIAGNOSIS — M545 Low back pain: Secondary | ICD-10-CM | POA: Diagnosis not present

## 2018-08-08 MED ORDER — TRIAMCINOLONE ACETONIDE 40 MG/ML IJ SUSP
INTRAMUSCULAR | Status: AC
  Filled 2018-08-08: qty 2

## 2018-08-08 MED ORDER — ROPIVACAINE HCL 2 MG/ML IJ SOLN
18.0000 mL | Freq: Once | INTRAMUSCULAR | Status: AC
  Administered 2018-08-08: 10 mL via PERINEURAL

## 2018-08-08 MED ORDER — ROPIVACAINE HCL 2 MG/ML IJ SOLN
INTRAMUSCULAR | Status: AC
  Filled 2018-08-08: qty 20

## 2018-08-08 MED ORDER — LIDOCAINE HCL 2 % IJ SOLN
20.0000 mL | Freq: Once | INTRAMUSCULAR | Status: AC
  Administered 2018-08-08: 400 mg

## 2018-08-08 MED ORDER — MIDAZOLAM HCL 5 MG/5ML IJ SOLN
1.0000 mg | INTRAMUSCULAR | Status: DC | PRN
  Administered 2018-08-08: 2 mg via INTRAVENOUS

## 2018-08-08 MED ORDER — LACTATED RINGERS IV SOLN
1000.0000 mL | Freq: Once | INTRAVENOUS | Status: AC
  Administered 2018-08-08: 1000 mL via INTRAVENOUS

## 2018-08-08 MED ORDER — FENTANYL CITRATE (PF) 100 MCG/2ML IJ SOLN
INTRAMUSCULAR | Status: AC
  Filled 2018-08-08: qty 2

## 2018-08-08 MED ORDER — LIDOCAINE HCL 2 % IJ SOLN
INTRAMUSCULAR | Status: AC
  Filled 2018-08-08: qty 20

## 2018-08-08 MED ORDER — FENTANYL CITRATE (PF) 100 MCG/2ML IJ SOLN
25.0000 ug | INTRAMUSCULAR | Status: DC | PRN
  Administered 2018-08-08: 50 ug via INTRAVENOUS

## 2018-08-08 MED ORDER — TRIAMCINOLONE ACETONIDE 40 MG/ML IJ SUSP
80.0000 mg | Freq: Once | INTRAMUSCULAR | Status: AC
  Administered 2018-08-08: 40 mg

## 2018-08-08 MED ORDER — MIDAZOLAM HCL 5 MG/5ML IJ SOLN
INTRAMUSCULAR | Status: AC
  Filled 2018-08-08: qty 5

## 2018-08-08 NOTE — Progress Notes (Signed)
Safety precautions to be maintained throughout the outpatient stay will include: orient to surroundings, keep bed in low position, maintain call bell within reach at all times, provide assistance with transfer out of bed and ambulation.  

## 2018-08-08 NOTE — Patient Instructions (Addendum)
____________________________________________________________________________________________  Post-Procedure Discharge Instructions  Instructions:  Apply ice:   Purpose: This will minimize any swelling and discomfort after procedure.   When: Day of procedure, as soon as you get home.  How: Fill a plastic sandwich bag with crushed ice. Cover it with a small towel and apply to injection site.  How long: (15 min on, 15 min off) Apply for 15 minutes then remove x 15 minutes.  Repeat sequence on day of procedure, until you go to bed.  Apply heat:   Purpose: To treat any soreness and discomfort from the procedure.  When: Starting the next day after the procedure.  How: Apply heat to procedure site starting the day following the procedure.  How long: May continue to repeat daily, until discomfort goes away.  Food intake: Start with clear liquids (like water) and advance to regular food, as tolerated.   Physical activities: Keep activities to a minimum for the first 8 hours after the procedure. After that, then as tolerated.  Driving: If you have received any sedation, be responsible and do not drive. You are not allowed to drive for 24 hours after having sedation.  Blood thinner: (Applies only to those taking blood thinners) You may restart your blood thinner 6 hours after your procedure.  Insulin: (Applies only to Diabetic patients taking insulin) As soon as you can eat, you may resume your normal dosing schedule.  Infection prevention: Keep procedure site clean and dry. Shower daily and clean area with soap and water.  Post-procedure Pain Diary: Extremely important that this be done correctly and accurately. Recorded information will be used to determine the next step in treatment. For the purpose of accuracy, follow these rules:  Evaluate only the area treated. Do not report or include pain from an untreated area. For the purpose of this evaluation, ignore all other areas of pain,  except for the treated area.  After your procedure, avoid taking a long nap and attempting to complete the pain diary after you wake up. Instead, set your alarm clock to go off every hour, on the hour, for the initial 8 hours after the procedure. Document the duration of the numbing medicine, and the relief you are getting from it.  Do not go to sleep and attempt to complete it later. It will not be accurate. If you received sedation, it is likely that you were given a medication that may cause amnesia. Because of this, completing the diary at a later time may cause the information to be inaccurate. This information is needed to plan your care.  Follow-up appointment: Keep your post-procedure follow-up evaluation appointment after the procedure (usually 2 weeks for most procedures, 6 weeks for radiofrequencies). DO NOT FORGET to bring you pain diary with you.   Expect: (What should I expect to see with my procedure?)  From numbing medicine (AKA: Local Anesthetics): Numbness or decrease in pain. You may also experience some weakness, which if present, could last for the duration of the local anesthetic.  Onset: Full effect within 15 minutes of injected.  Duration: It will depend on the type of local anesthetic used. On the average, 1 to 8 hours.   From steroids (Applies only if steroids were used): Decrease in swelling or inflammation. Once inflammation is improved, relief of the pain will follow.  Onset of benefits: Depends on the amount of swelling present. The more swelling, the longer it will take for the benefits to be seen. In some cases, up to 10 days.    Duration: Steroids will stay in the system x 2 weeks. Duration of benefits will depend on multiple posibilities including persistent irritating factors.  Side-effects: If present, they may typically last 2 weeks (the duration of the steroids).  Frequent: Cramps (if they occur, drink Gatorade and take over-the-counter Magnesium 450-500 mg  once to twice a day); water retention with temporary weight gain; increases in blood sugar; decreased immune system response; increased appetite.  Occasional: Facial flushing (red, warm cheeks); mood swings; menstrual changes.  Uncommon: Long-term decrease or suppression of natural hormones; bone thinning. (These are more common with higher doses or more frequent use. This is why we prefer that our patients avoid having any injection therapies in other practices.)   Very Rare: Severe mood changes; psychosis; aseptic necrosis.  From procedure: Some discomfort is to be expected once the numbing medicine wears off. This should be minimal if ice and heat are applied as instructed.  Call if: (When should I call?)  You experience numbness and weakness that gets worse with time, as opposed to wearing off.  New onset bowel or bladder incontinence. (Applies only to procedures done in the spine)  Emergency Numbers:  Durning business hours (Monday - Thursday, 8:00 AM - 4:00 PM) (Friday, 9:00 AM - 12:00 Noon): [phone]  After hours: [phone]  NOTE: If you are having a problem and are unable connect with, or to talk to a provider, then go to your nearest urgent care or emergency department. If the problem is serious and urgent, please call 911. ____________________________________________________________________________________________   Pain Management Discharge Instructions  General Discharge Instructions :  If you need to reach your doctor call: Monday-Friday 8:00 am - 4:00 pm at [phone] or toll free [phone].  After clinic hours [phone] to have operator reach doctor.  Bring all of your medication bottles to all your appointments in the pain clinic.  To cancel or reschedule your appointment with Pain Management please remember to call 24 hours in advance to avoid a fee.  Refer to the educational materials which you have been given on: General Risks, I had my  Procedure. Discharge Instructions, Post Sedation.  Post Procedure Instructions:  The drugs you were given will stay in your system until tomorrow, so for the next 24 hours you should not drive, make any legal decisions or drink any alcoholic beverages.  You may eat anything you prefer, but it is better to start with liquids then soups and crackers, and gradually work up to solid foods.  Please notify your doctor immediately if you have any unusual bleeding, trouble breathing or pain that is not related to your normal pain.  Depending on the type of procedure that was done, some parts of your body may feel week and/or numb.  This usually clears up by tonight or the next day.  Walk with the use of an assistive device or accompanied by an adult for the 24 hours.  You may use ice on the affected area for the first 24 hours.  Put ice in a Ziploc bag and cover with a towel and place against area 15 minutes on 15 minutes off.  You may switch to heat after 24 hours.Facet Blocks Patient Information  Description: The facets are joints in the spine between the vertebrae.  Like any joints in the body, facets can become irritated and painful.  Arthritis can also effect the facets.  By injecting steroids and local anesthetic in and around these joints, we can temporarily block the nerve   supply to them.  Steroids act directly on irritated nerves and tissues to reduce selling and inflammation which often leads to decreased pain.  Facet blocks may be done anywhere along the spine from the neck to the low back depending upon the location of your pain.   After numbing the skin with local anesthetic (like Novocaine), a small needle is passed onto the facet joints under x-ray guidance.  You may experience a sensation of pressure while this is being done.  The entire block usually lasts about 15-25 minutes.   Conditions which may be treated by facet blocks:   Low back/buttock pain  Neck/shoulder pain  Certain  types of headaches  Preparation for the injection:  1. Do not eat any solid food or dairy products within 8 hours of your appointment. 2. You may drink clear liquid up to 3 hours before appointment.  Clear liquids include water, black coffee, juice or soda.  No milk or cream please. 3. You may take your regular medication, including pain medications, with a sip of water before your appointment.  Diabetics should hold regular insulin (if taken separately) and take 1/2 normal NPH dose the morning of the procedure.  Carry some sugar containing items with you to your appointment. 4. A driver must accompany you and be prepared to drive you home after your procedure. 5. Bring all your current medications with you. 6. An IV may be inserted and sedation may be given at the discretion of the physician. 7. A blood pressure cuff, EKG and other monitors will often be applied during the procedure.  Some patients may need to have extra oxygen administered for a short period. 8. You will be asked to provide medical information, including your allergies and medications, prior to the procedure.  We must know immediately if you are taking blood thinners (like Coumadin/Warfarin) or if you are allergic to IV iodine contrast (dye).  We must know if you could possible be pregnant.  Possible side-effects:   Bleeding from needle site  Infection (rare, may require surgery)  Nerve injury (rare)  Numbness & tingling (temporary)  Difficulty urinating (rare, temporary)  Spinal headache (a headache worse with upright posture)  Light-headedness (temporary)  Pain at injection site (serveral days)  Decreased blood pressure (rare, temporary)  Weakness in arm/leg (temporary)  Pressure sensation in back/neck (temporary)   Call if you experience:   Fever/chills associated with headache or increased back/neck pain  Headache worsened by an upright position  New onset, weakness or numbness of an extremity  below the injection site  Hives or difficulty breathing (go to the emergency room)  Inflammation or drainage at the injection site(s)  Severe back/neck pain greater than usual  New symptoms which are concerning to you  Please note:  Although the local anesthetic injected can often make your back or neck feel good for several hours after the injection, the pain will likely return. It takes 3-7 days for steroids to work.  You may not notice any pain relief for at least one week.  If effective, we will often do a series of 2-3 injections spaced 3-6 weeks apart to maximally decrease your pain.  After the initial series, you may be a candidate for a more permanent nerve block of the facets.  If you have any questions, please call [phone] [facility] 

## 2018-08-08 NOTE — Progress Notes (Signed)
Patient's Name: Elizabeth Bowers  MRN: [id number]  Referring Provider: Virginia Crews, MD  DOB: 10/09/1965  PCP: Virginia Crews, MD  DOS: 08/08/2018  Note by: Gaspar Cola, MD  Service setting: Ambulatory outpatient  Specialty: Interventional Pain Management  Patient type: Established  Location: [facility] (AMB) Pain Management Facility  Visit type: Interventional Procedure   Primary Reason for Visit: Interventional Pain Management Treatment. CC: Back Pain (lower)  Procedure:          Anesthesia, Analgesia, Anxiolysis:  Type: Lumbar Facet, Medial Branch Block(s) #2  Primary Purpose: Diagnostic Region: Posterolateral Lumbosacral Spine Level: L2, L3, L4, L5, & S1 Medial Branch Level(s). Injecting these levels blocks the L3-4, L4-5, and L5-S1 lumbar facet joints. Laterality: Bilateral  Type: Moderate (Conscious) Sedation combined with Local Anesthesia Indication(s): Analgesia and Anxiety Route: Intravenous (IV) IV Access: Secured Sedation: Meaningful verbal contact was maintained at all times during the procedure  Local Anesthetic: Lidocaine 1-2%  Position: Prone   Indications: 1. Spondylosis without myelopathy or radiculopathy, lumbar region   2. Lumbar facet syndrome (Bilateral) (R>L)   3. DDD (degenerative disc disease), lumbar   4. Chronic low back pain (Bilateral) w/o sciatica   5. Cervical facet syndrome (Bilateral) (R>L)   6. Lumbar facet joint syndrome   7. Chronic bilateral low back pain without sciatica    Pain Score: Pre-procedure: 4 /10 Post-procedure: 0-No pain/10  Post-Procedure Assessment  07/23/2018 Procedure: Therapeutic right-sided cervical facet RFA #1under fluoroscopic guidance and IV sedation Pre-procedure pain score:  3/10 Post-procedure pain score: 0/10 (100% relief) Influential Factors: BMI: 29.08 kg/m Intra-procedural challenges: None observed.         Assessment challenges: None detected.              Reported side-effects: None.          Post-procedural adverse reactions or complications: None reported         Sedation: Sedation provided. When no sedatives are used, the analgesic levels obtained are directly associated to the effectiveness of the local anesthetics. However, when sedation is provided, the level of analgesia obtained during the initial 1 hour following the intervention, is believed to be the result of a combination of factors. These factors may include, but are not limited to: 1. The effectiveness of the local anesthetics used. 2. The effects of the analgesic(s) and/or anxiolytic(s) used. 3. The degree of discomfort experienced by the patient at the time of the procedure. 4. The patients ability and reliability in recalling and recording the events. 5. The presence and influence of possible secondary gains and/or psychosocial factors. Reported result: Relief experienced during the 1st hour after the procedure: 100 % (Ultra-Short Term Relief)            Interpretative annotation: Clinically appropriate result. Analgesia during this period is likely to be Local Anesthetic and/or IV Sedative (Analgesic/Anxiolytic) related.          Effects of local anesthetic: The analgesic effects attained during this period are directly associated to the localized infiltration of local anesthetics and therefore cary significant diagnostic value as to the etiological location, or anatomical origin, of the pain. Expected duration of relief is directly dependent on the pharmacodynamics of the local anesthetic used. Long-acting (4-6 hours) anesthetics used.  Reported result: Relief during the next 4 to 6 hour after the procedure: 50 %(after 4 hours, when attempting to turn head while still numb, pain returned.) (Short-Term Relief)  Interpretative annotation: Clinically appropriate result. Analgesia during this period is likely to be Local Anesthetic-related.          Long-term benefit: Defined as the period of time past the  expected duration of local anesthetics (1 hour for short-acting and 4-6 hours for long-acting). With the possible exception of prolonged sympathetic blockade from the local anesthetics, benefits during this period are typically attributed to, or associated with, other factors such as analgesic sensory neuropraxia, antiinflammatory effects, or beneficial biochemical changes provided by agents other than the local anesthetics.  Reported result: Extended relief following procedure: 0 %(feels worse than before procedure, c/o swelling in the back of head, right side)) (Long-Term Relief)            Interpretative annotation: Clinically possible results. Good relief. No permanent benefit expected. Inflammation plays a part in the etiology to the pain.          Pre-op Assessment:  Elizabeth Bowers is a 53 y.o. (year old), female patient, seen today for interventional treatment. She  has a past surgical history that includes Elbow Debridement; Foot surgery; Cholecystectomy; Colonoscopy; Abdominal hysterectomy; and Esophagogastroduodenoscopy (egd) with propofol (N/A, 02/06/2018). Elizabeth Bowers has a current medication list which includes the following prescription(s): docusate sodium, duloxetine, lansoprazole, linzess, multivitamin with minerals, potassium chloride sa, triamterene-hydrochlorothiazide, vitamin c, lansoprazole, and magnesium oxide, and the following Facility-Administered Medications: fentanyl and midazolam. Her primarily concern today is the Back Pain (lower)  Initial Vital Signs:  Pulse/HCG Rate: 61ECG Heart Rate: 65 Temp: 98.2 F (36.8 C) Resp: 18 BP: 133/76 SpO2: 98 %  BMI: Estimated body mass index is 29.08 kg/m as calculated from the following:   Height as of this encounter: 5\' 2"  (1.575 m).   Weight as of this encounter: 159 lb (72.1 kg).  Risk Assessment: Allergies: Reviewed. She is allergic to ranitidine; amphetamine-dextroamphetamine; lactose intolerance (gi); reglan [metoclopramide]; topiramate  er; wheat bran; and sulfa antibiotics.  Allergy Precautions: None required Coagulopathies: Reviewed. None identified.  Blood-thinner therapy: None at this time Active Infection(s): Reviewed. None identified. Elizabeth Bowers is afebrile  Site Confirmation: Ms. Champa was asked to confirm the procedure and laterality before marking the site Procedure checklist: Completed Consent: Before the procedure and under the influence of no sedative(s), amnesic(s), or anxiolytics, the patient was informed of the treatment options, risks and possible complications. To fulfill our ethical and legal obligations, as recommended by the American Medical Association's Code of Ethics, I have informed the patient of my clinical impression; the nature and purpose of the treatment or procedure; the risks, benefits, and possible complications of the intervention; the alternatives, including doing nothing; the risk(s) and benefit(s) of the alternative treatment(s) or procedure(s); and the risk(s) and benefit(s) of doing nothing. The patient was provided information about the general risks and possible complications associated with the procedure. These may include, but are not limited to: failure to achieve desired goals, infection, bleeding, organ or nerve damage, allergic reactions, paralysis, and death. In addition, the patient was informed of those risks and complications associated to Spine-related procedures, such as failure to decrease pain; infection (i.e.: Meningitis, epidural or intraspinal abscess); bleeding (i.e.: epidural hematoma, subarachnoid hemorrhage, or any other type of intraspinal or peri-dural bleeding); organ or nerve damage (i.e.: Any type of peripheral nerve, nerve root, or spinal cord injury) with subsequent damage to sensory, motor, and/or autonomic systems, resulting in permanent pain, numbness, and/or weakness of one or several areas of the body; allergic reactions; (i.e.: anaphylactic reaction); and/or  death. Furthermore,  the patient was informed of those risks and complications associated with the medications. These include, but are not limited to: allergic reactions (i.e.: anaphylactic or anaphylactoid reaction(s)); adrenal axis suppression; blood sugar elevation that in diabetics may result in ketoacidosis or comma; water retention that in patients with history of congestive heart failure may result in shortness of breath, pulmonary edema, and decompensation with resultant heart failure; weight gain; swelling or edema; medication-induced neural toxicity; particulate matter embolism and blood vessel occlusion with resultant organ, and/or nervous system infarction; and/or aseptic necrosis of one or more joints. Finally, the patient was informed that Medicine is not an exact science; therefore, there is also the possibility of unforeseen or unpredictable risks and/or possible complications that may result in a catastrophic outcome. The patient indicated having understood very clearly. We have given the patient no guarantees and we have made no promises. Enough time was given to the patient to ask questions, all of which were answered to the patient's satisfaction. Ms. Scruggs has indicated that she wanted to continue with the procedure. Attestation: I, the ordering provider, attest that I have discussed with the patient the benefits, risks, side-effects, alternatives, likelihood of achieving goals, and potential problems during recovery for the procedure that I have provided informed consent. Date  Time: 08/08/2018  8:09 AM  Pre-Procedure Preparation:  Monitoring: As per clinic protocol. Respiration, ETCO2, SpO2, BP, heart rate and rhythm monitor placed and checked for adequate function Safety Precautions: Patient was assessed for positional comfort and pressure points before starting the procedure. Time-out: I initiated and conducted the "Time-out" before starting the procedure, as per protocol. The patient  was asked to participate by confirming the accuracy of the "Time Out" information. Verification of the correct person, site, and procedure were performed and confirmed by me, the nursing staff, and the patient. "Time-out" conducted as per Joint Commission's Universal Protocol (UP.01.01.01). Time: 0849  Description of Procedure:          Laterality: Bilateral. The procedure was performed in identical fashion on both sides. Levels:  L2, L3, L4, L5, & S1 Medial Branch Level(s) Area Prepped: Posterior Lumbosacral Region Prepping solution: ChloraPrep (2% chlorhexidine gluconate and 70% isopropyl alcohol) Safety Precautions: Aspiration looking for blood return was conducted prior to all injections. At no point did we inject any substances, as a needle was being advanced. Before injecting, the patient was told to immediately notify me if she was experiencing any new onset of "ringing in the ears, or metallic taste in the mouth". No attempts were made at seeking any paresthesias. Safe injection practices and needle disposal techniques used. Medications properly checked for expiration dates. SDV (single dose vial) medications used. After the completion of the procedure, all disposable equipment used was discarded in the proper designated medical waste containers. Local Anesthesia: Protocol guidelines were followed. The patient was positioned over the fluoroscopy table. The area was prepped in the usual manner. The time-out was completed. The target area was identified using fluoroscopy. A 12-in long, straight, sterile hemostat was used with fluoroscopic guidance to locate the targets for each level blocked. Once located, the skin was marked with an approved surgical skin marker. Once all sites were marked, the skin (epidermis, dermis, and hypodermis), as well as deeper tissues (fat, connective tissue and muscle) were infiltrated with a small amount of a short-acting local anesthetic, loaded on a 10cc syringe with a  25G, 1.5-in  Needle. An appropriate amount of time was allowed for local anesthetics to take effect before proceeding  to the next step. Local Anesthetic: Lidocaine 2.0% The unused portion of the local anesthetic was discarded in the proper designated containers. Technical explanation of process:  L2 Medial Branch Nerve Block (MBB): The target area for the L2 medial branch is at the junction of the postero-lateral aspect of the superior articular process and the superior, posterior, and medial edge of the transverse process of L3. Under fluoroscopic guidance, a Quincke needle was inserted until contact was made with os over the superior postero-lateral aspect of the pedicular shadow (target area). After negative aspiration for blood, 0.5 mL of the nerve block solution was injected without difficulty or complication. The needle was removed intact. L3 Medial Branch Nerve Block (MBB): The target area for the L3 medial branch is at the junction of the postero-lateral aspect of the superior articular process and the superior, posterior, and medial edge of the transverse process of L4. Under fluoroscopic guidance, a Quincke needle was inserted until contact was made with os over the superior postero-lateral aspect of the pedicular shadow (target area). After negative aspiration for blood, 0.5 mL of the nerve block solution was injected without difficulty or complication. The needle was removed intact. L4 Medial Branch Nerve Block (MBB): The target area for the L4 medial branch is at the junction of the postero-lateral aspect of the superior articular process and the superior, posterior, and medial edge of the transverse process of L5. Under fluoroscopic guidance, a Quincke needle was inserted until contact was made with os over the superior postero-lateral aspect of the pedicular shadow (target area). After negative aspiration for blood, 0.5 mL of the nerve block solution was injected without difficulty or  complication. The needle was removed intact. L5 Medial Branch Nerve Block (MBB): The target area for the L5 medial branch is at the junction of the postero-lateral aspect of the superior articular process and the superior, posterior, and medial edge of the sacral ala. Under fluoroscopic guidance, a Quincke needle was inserted until contact was made with os over the superior postero-lateral aspect of the pedicular shadow (target area). After negative aspiration for blood, 0.5 mL of the nerve block solution was injected without difficulty or complication. The needle was removed intact. S1 Medial Branch Nerve Block (MBB): The target area for the S1 medial branch is at the posterior and inferior 6 o'clock position of the L5-S1 facet joint. Under fluoroscopic guidance, the Quincke needle inserted for the L5 MBB was redirected until contact was made with os over the inferior and postero aspect of the sacrum, at the 6 o' clock position under the L5-S1 facet joint (Target area). After negative aspiration for blood, 0.5 mL of the nerve block solution was injected without difficulty or complication. The needle was removed intact.  Nerve block solution: 0.2% PF-Ropivacaine + Triamcinolone (40 mg/mL) diluted to a final concentration of 4 mg of Triamcinolone/mL of Ropivacaine The unused portion of the solution was discarded in the proper designated containers. Procedural Needles: 22-gauge, 3.5-inch, Quincke needles used for all levels.  Once the entire procedure was completed, the treated area was cleaned, making sure to leave some of the prepping solution back to take advantage of its long term bactericidal properties.   Illustration of the posterior view of the lumbar spine and the posterior neural structures. Laminae of L2 through S1 are labeled. DPRL5, dorsal primary ramus of L5; DPRS1, dorsal primary ramus of S1; DPR3, dorsal primary ramus of L3; FJ, facet (zygapophyseal) joint L3-L4; I, inferior articular process  of L4; LB1,  lateral branch of dorsal primary ramus of L1; IAB, inferior articular branches from L3 medial branch (supplies L4-L5 facet joint); IBP, intermediate branch plexus; MB3, medial branch of dorsal primary ramus of L3; NR3, third lumbar nerve root; S, superior articular process of L5; SAB, superior articular branches from L4 (supplies L4-5 facet joint also); TP3, transverse process of L3.  Vitals:   08/08/18 0900 08/08/18 0910 08/08/18 0920 08/08/18 0930  BP: 133/85 140/84 (!) 149/87 137/81  Pulse:      Resp: 10 10 12 12   Temp:  98.3 F (36.8 C)    TempSrc:      SpO2: 99% 100% 100% 100%  Weight:      Height:         Start Time: 0849 hrs. End Time: 0859 hrs.  Imaging Guidance (Spinal):          Type of Imaging Technique: Fluoroscopy Guidance (Spinal) Indication(s): Assistance in needle guidance and placement for procedures requiring needle placement in or near specific anatomical locations not easily accessible without such assistance. Exposure Time: Please see nurses notes. Contrast: None used. Fluoroscopic Guidance: I was personally present during the use of fluoroscopy. "Tunnel Vision Technique" used to obtain the best possible view of the target area. Parallax error corrected before commencing the procedure. "Direction-depth-direction" technique used to introduce the needle under continuous pulsed fluoroscopy. Once target was reached, antero-posterior, oblique, and lateral fluoroscopic projection used confirm needle placement in all planes. Images permanently stored in EMR. Interpretation: No contrast injected. I personally interpreted the imaging intraoperatively. Adequate needle placement confirmed in multiple planes. Permanent images saved into the patient's record.  Antibiotic Prophylaxis:   Anti-infectives (From admission, onward)   None     Indication(s): None identified  Post-operative Assessment:  Post-procedure Vital Signs:  Pulse/HCG Rate: 6173 Temp: 98.3 F  (36.8 C) Resp: 12 BP: 137/81 SpO2: 100 %  EBL: None  Complications: No immediate post-treatment complications observed by team, or reported by patient.  Note: The patient tolerated the entire procedure well. A repeat set of vitals were taken after the procedure and the patient was kept under observation following institutional policy, for this type of procedure. Post-procedural neurological assessment was performed, showing return to baseline, prior to discharge. The patient was provided with post-procedure discharge instructions, including a section on how to identify potential problems. Should any problems arise concerning this procedure, the patient was given instructions to immediately contact us, at any time, without hesitation. In any case, we plan to contact the patient by telephone for a follow-up status report regarding this interventional procedure.  Comments:  No additional relevant information.  Plan of Care    Imaging Orders     DG C-Arm 1-60 Min-No Report Orders:  Orders Placed This Encounter  Procedures  . LUMBAR FACET(MEDIAL BRANCH NERVE BLOCK) MBNB    Scheduling Instructions:     Side: Bilateral     Level: L3-4, L4-5, & L5-S1 Facets (L2, L3, L4, L5, & S1 Medial Branch Nerves)     Sedation: With Sedation.     Timeframe: Today    Order Specific Question:   Where will this procedure be performed?    Answer:   [facility]  . Radiofrequency,Cervical    Standing Status:   Future    Standing Expiration Date:   02/08/2019    Scheduling Instructions:     Side(s): Left-sided     Level(s): C3, C4, C5, C6, & C7 Medial Branch Nerve(s)     Sedation: With Sedation  Scheduling Timeframe: 2 weeks from now    Order Specific Question:   Where will this procedure be performed?    Answer:   [facility]  . DG C-Arm 1-60 Min-No Report    Intraoperative interpretation by procedural physician at [place].    Standing Status:   Standing    Number of  Occurrences:   1    Order Specific Question:   Reason for exam:    Answer:   Assistance in needle guidance and placement for procedures requiring needle placement in or near specific anatomical locations not easily accessible without such assistance.  . Provider attestation of informed consent for procedure/surgical case    I, the ordering provider, attest that I have discussed with the patient the benefits, risks, side effects, alternatives, likelihood of achieving goals and potential problems during recovery for the procedure that I have provided informed consent.    Standing Status:   Standing    Number of Occurrences:   1  . Informed Consent Details: Transcribe to consent form and obtain patient signature    Consent Attestation: I, the ordering provider, attest that I have discussed with the patient the benefits, risks, side-effects, alternatives, likelihood of achieving goals, and potential problems during recovery for the procedure that I have provided informed consent.    Standing Status:   Standing    Number of Occurrences:   1    Order Specific Question:   Procedure    Answer:   Diagnostic, Bilateral, Lumbar facet block under fluoroscopic guidance. (See notes for level(s).)    Order Specific Question:   Surgeon    Answer:   Kathlen Brunswick. Dossie Arbour, MD    Order Specific Question:   Indication/Reason    Answer:   Chronic low back pain secondary to lumbar facet syndrome and primary osteoarthritis of the lumbar spine    Medications ordered for procedure: Meds ordered this encounter  Medications  . lidocaine (XYLOCAINE) 2 % (with pres) injection 400 mg  . midazolam (VERSED) 5 MG/5ML injection 1-2 mg    Make sure Flumazenil is available in the pyxis when using this medication. If oversedation occurs, administer 0.2 mg IV over 15 sec. If after 45 sec no response, administer 0.2 mg again over 1 min; may repeat at 1 min intervals; not to exceed 4 doses (1 mg)  . fentaNYL (SUBLIMAZE) injection  25-50 mcg    Make sure Narcan is available in the pyxis when using this medication. In the event of respiratory depression (RR< 8/min): Titrate NARCAN (naloxone) in increments of 0.1 to 0.2 mg IV at 2-3 minute intervals, until desired degree of reversal.  . lactated ringers infusion 1,000 mL  . ropivacaine (PF) 2 mg/mL (0.2%) (NAROPIN) injection 18 mL  . triamcinolone acetonide (KENALOG-40) injection 80 mg   Medications administered: We administered lidocaine, midazolam, fentaNYL, lactated ringers, ropivacaine (PF) 2 mg/mL (0.2%), and triamcinolone acetonide.  See the medical record for exact dosing, route, and time of administration.  Disposition: Discharge home  Discharge Date & Time: 08/08/2018; 0930 hrs.   Follow-up plan:   Return for contralateral RFA (2 wks): (L) C-FCT RFA #1.   Interventional management options: Planned, scheduled, and/or pending: Therapeutic left-sided cervical facet RFA #1under fluoroscopic guidance and IV sedation in approximately 2 weeks   Considering: Possible bilateral cervical facet RFA#1 Diagnostic bilateral lumbar facet block #2 Possible bilateral lumbar facet RFA#1 Diagnostic right-sided sacroiliac joint block Possible right-sided sacroiliac joint RFA Diagnostic bilateral intra-articular hip joint injection with  local anesthetic and steroid Diagnostic bilateral femoral nerve block + obturator nerve block Possible bilateral femoral nerve + obturator nerve RFA Diagnostic bilateral L5 transforaminal ESI Diagnostic bilateal intra-articular knee joint injection with local anesthetic and steroid Therapeutic series of 5 intra-articular bilateral Hyalgan knee injections Diagnostic bilateral genicular nerve block Possible bilateral genicular nerve RFA   Palliative PRN treatment(s): Palliativebilateral cervical facet blocks Diagnosticbilaterallumbarfacet block#2 Diagnostic right-sided interlaminarCervicalESI#2   Future  Appointments  Date Time Provider [place]  08/15/2018 10:30 AM Milinda Pointer, MD [facility] None  08/21/2018  8:40 AM Bacigalupo, [name], MD [facility] None   Primary Care Physician: Virginia Crews, MD Location: [facility] Note by: Gaspar Cola, MD Date: 08/08/2018; Time: 11:33 AM  Disclaimer:  Medicine is not an exact science. The only guarantee in medicine is that nothing is guaranteed. It is important to note that the decision to proceed with this intervention was based on the information collected from the patient. The Data and conclusions were drawn from the patient's questionnaire, the interview, and the physical examination. Because the information was provided in large part by the patient, it cannot be guaranteed that it has not been purposely or unconsciously manipulated. Every effort has been made to obtain as much relevant data as possible for this evaluation. It is important to note that the conclusions that lead to this procedure are derived in large part from the available data. Always take into account that the treatment will also be dependent on availability of resources and existing treatment guidelines, considered by other Pain Management Practitioners as being common knowledge and practice, at the time of the intervention. For Medico-Legal purposes, it is also important to point out that variation in procedural techniques and pharmacological choices are the acceptable norm. The indications, contraindications, technique, and results of the above procedure should only be interpreted and judged by a Board-Certified Interventional Pain Specialist with extensive familiarity and expertise in the same exact procedure and technique.

## 2018-08-09 ENCOUNTER — Telehealth: Payer: Self-pay | Admitting: *Deleted

## 2018-08-09 NOTE — Telephone Encounter (Signed)
No problems post procedure. 

## 2018-08-15 ENCOUNTER — Ambulatory Visit: Payer: Commercial Managed Care - PPO | Admitting: Pain Medicine

## 2018-08-15 ENCOUNTER — Ambulatory Visit: Payer: 59 | Admitting: Pain Medicine

## 2018-08-21 ENCOUNTER — Ambulatory Visit: Payer: 59 | Admitting: Family Medicine

## 2018-08-21 NOTE — Progress Notes (Deleted)
Patient: Elizabeth Bowers Female    DOB: [birth year]   53 y.o.   MRN: [id number] Visit Date: 08/21/2018  Today's Provider: Lavon Paganini, MD   No chief complaint on file.  Subjective:    I, Tiburcio Pea, CMA, am acting as a scribe for Lavon Paganini, MD.   HPI Patient is here today for 6 month follow-up weight loss counseling.  Patient advised to finish 3 month course of Phentermine, diet and exercise. She reports good compliance with treatment plan. Last weight was 159 lb. Today weight is ** lbs. She reports exercising ** times a week.  Wt Readings from Last 3 Encounters:  08/08/18 159 lb (72.1 kg)  08/01/18 165 lb 9.6 oz (75.1 kg)  07/23/18 159 lb (72.1 kg)     Allergies  Allergen Reactions  . Ranitidine Diarrhea and Other (See Comments)    Diarrhea and stomach cramping  . Amphetamine-Dextroamphetamine Other (See Comments)    Elevated BP  . Lactose Intolerance (Gi)     bloating  . Reglan [Metoclopramide] Other (See Comments)    Increased prolactin level   . Topiramate Er Other (See Comments)    Severe headache, nausea, vomiting, dizziness, visual disturbance    . Wheat Bran     bloating  . Sulfa Antibiotics Anxiety     Current Outpatient Medications:  .  docusate sodium (COLACE) 100 MG capsule, Take 200 mg by mouth at bedtime. , Disp: , Rfl:  .  DULoxetine (CYMBALTA) 60 MG capsule, TAKE 1 CAPSULE (60 MG TOTAL) BY MOUTH DAILY., Disp: 30 capsule, Rfl: 11 .  lansoprazole (PREVACID) 30 MG capsule, TAKE 1 CAPSULE BY MOUTH TWICE DAILY, Disp: 60 capsule, Rfl: 1 .  lansoprazole (PREVACID) 30 MG capsule, Take 1 capsule (30 mg total) by mouth 2 (two) times daily before a meal. (Patient not taking: Reported on 08/01/2018), Disp: 180 capsule, Rfl: 0 .  LINZESS 290 MCG CAPS capsule, Take 1 capsule (290 mcg total) by mouth daily., Disp: 90 capsule, Rfl: 3 .  Magnesium Oxide 500 MG CAPS, Take 1 capsule (500 mg total) by mouth at bedtime as needed and may repeat dose one  time if needed. (Patient not taking: Reported on 08/08/2018), Disp: 90 capsule, Rfl: 0 .  Multiple Vitamin (MULTIVITAMIN WITH MINERALS) TABS tablet, Take 1 tablet by mouth daily., Disp: , Rfl:  .  potassium chloride SA (K-DUR,KLOR-CON) 20 MEQ tablet, TAKE 1 TABLET BY MOUTH DAILY., Disp: 30 tablet, Rfl: 5 .  triamterene-hydrochlorothiazide (MAXZIDE-25) 37.5-25 MG tablet, Take 1 tablet by mouth daily., Disp: 90 tablet, Rfl: 3 .  vitamin C (ASCORBIC ACID) 500 MG tablet, Take 500 mg by mouth daily., Disp: , Rfl:   Review of Systems  Social History   Tobacco Use  . Smoking status: Former Smoker    Packs/day: 0.75    Years: 5.00    Pack years: 3.75    Types: Cigarettes    Last attempt to quit: 07/06/1987    Years since quitting: 31.1  . Smokeless tobacco: Never Used  Substance Use Topics  . Alcohol use: Yes    Alcohol/week: 2.0 - 4.0 standard drinks    Types: 2 - 4 Cans of beer per week    Comment: wine cooler      Objective:   There were no vitals taken for this visit. There were no vitals filed for this visit.   Physical Exam      Assessment & Plan  Lavon Paganini, MD  [facility]

## 2018-08-22 ENCOUNTER — Ambulatory Visit (INDEPENDENT_AMBULATORY_CARE_PROVIDER_SITE_OTHER): Payer: 59 | Admitting: Family Medicine

## 2018-08-22 ENCOUNTER — Ambulatory Visit: Payer: 59 | Admitting: Pain Medicine

## 2018-08-22 ENCOUNTER — Encounter: Payer: Self-pay | Admitting: Family Medicine

## 2018-08-22 ENCOUNTER — Other Ambulatory Visit: Payer: Self-pay

## 2018-08-22 VITALS — BP 130/86 | HR 75 | Temp 99.1°F | Wt 163.2 lb

## 2018-08-22 DIAGNOSIS — E663 Overweight: Secondary | ICD-10-CM | POA: Diagnosis not present

## 2018-08-22 DIAGNOSIS — M797 Fibromyalgia: Secondary | ICD-10-CM | POA: Diagnosis not present

## 2018-08-22 MED ORDER — NALTREXONE-BUPROPION HCL ER 8-90 MG PO TB12: ORAL_TABLET | ORAL | 0 refills | Status: DC

## 2018-08-22 MED ORDER — DULOXETINE HCL 60 MG PO CPEP: 60.0000 mg | ORAL_CAPSULE | Freq: Every day | ORAL | 3 refills | Status: DC

## 2018-08-22 NOTE — Patient Instructions (Signed)

## 2018-08-22 NOTE — Assessment & Plan Note (Signed)
Chronic and stable Continue Cymbalta at current dose Also followed by pain management

## 2018-08-22 NOTE — Progress Notes (Signed)
Patient: Elizabeth Bowers Female    DOB: 1965/08/13   53 y.o.   MRN: [id number] Visit Date: 08/22/2018  Today's Provider: Lavon Paganini, MD   Chief Complaint  Patient presents with  . Weight Check   Subjective:    I, Tiburcio Pea, CMA, am acting as a scribe for Lavon Paganini, MD.   HPI Patient is here today for 6 month follow-up weight loss counseling.  Patient advised to finish 3 month course of Phentermine, diet and exercise. She reports good compliance with treatment plan. Last weight was 159lb. Today weight is 163.2 lbs. She reports exercising 3-4 times a week. She is walking 12,000-15,000 steps.  Topamax made migraines worse, so unable to take.     Wt Readings from Last 3 Encounters:  08/22/18 163 lb 3.2 oz (74 kg)  08/08/18 159 lb (72.1 kg)  08/01/18 165 lb 9.6 oz (75.1 kg)    Allergies  Allergen Reactions  . Ranitidine Diarrhea and Other (See Comments)    Diarrhea and stomach cramping  . Amphetamine-Dextroamphetamine Other (See Comments)    Elevated BP  . Lactose Intolerance (Gi)     bloating  . Reglan [Metoclopramide] Other (See Comments)    Increased prolactin level   . Topiramate Er Other (See Comments)    Severe headache, nausea, vomiting, dizziness, visual disturbance    . Wheat Bran     bloating  . Sulfa Antibiotics Anxiety     Current Outpatient Medications:  .  docusate sodium (COLACE) 100 MG capsule, Take 200 mg by mouth at bedtime. , Disp: , Rfl:  .  DULoxetine (CYMBALTA) 60 MG capsule, Take 1 capsule (60 mg total) by mouth daily., Disp: 90 capsule, Rfl: 3 .  lansoprazole (PREVACID) 30 MG capsule, Take 1 capsule (30 mg total) by mouth 2 (two) times daily before a meal., Disp: 180 capsule, Rfl: 0 .  LINZESS 290 MCG CAPS capsule, Take 1 capsule (290 mcg total) by mouth daily., Disp: 90 capsule, Rfl: 3 .  Multiple Vitamin (MULTIVITAMIN WITH MINERALS) TABS tablet, Take 1 tablet by mouth daily., Disp: , Rfl:  .  oxyCODONE-acetaminophen  (PERCOCET/ROXICET) 5-325 MG tablet, , Disp: , Rfl:  .  potassium chloride SA (K-DUR,KLOR-CON) 20 MEQ tablet, TAKE 1 TABLET BY MOUTH DAILY., Disp: 30 tablet, Rfl: 5 .  triamterene-hydrochlorothiazide (MAXZIDE-25) 37.5-25 MG tablet, Take 1 tablet by mouth daily., Disp: 90 tablet, Rfl: 3 .  vitamin C (ASCORBIC ACID) 500 MG tablet, Take 500 mg by mouth daily., Disp: , Rfl:  .  Naltrexone-buPROPion HCl ER 8-90 MG TB12, Take 1 tab PO daily x7d, then 1 tab BID x7d, then 2 tab qAM and 1 tab qPM x7d, and then 2 tabs PO BID x7d, Disp: 70 tablet, Rfl: 0  Review of Systems  Constitutional: Negative.   Respiratory: Negative.   Cardiovascular: Negative.   Musculoskeletal: Negative.     Social History   Tobacco Use  . Smoking status: Former Smoker    Packs/day: 0.75    Years: 5.00    Pack years: 3.75    Types: Cigarettes    Last attempt to quit: 07/06/1987    Years since quitting: 31.1  . Smokeless tobacco: Never Used  Substance Use Topics  . Alcohol use: Yes    Alcohol/week: 2.0 - 4.0 standard drinks    Types: 2 - 4 Cans of beer per week    Comment: wine cooler      Objective:   BP 130/86 (BP Location:  Right Arm, Patient Position: Sitting, Cuff Size: Normal)   Pulse 75   Temp 99.1 F (37.3 C) (Oral)   Wt 163 lb 3.2 oz (74 kg)   SpO2 97%   BMI 29.85 kg/m  Vitals:   08/22/18 0950  BP: 130/86  Pulse: 75  Temp: 99.1 F (37.3 C)  TempSrc: Oral  SpO2: 97%  Weight: 163 lb 3.2 oz (74 kg)     Physical Exam Vitals signs reviewed.  Constitutional:      General: She is not in acute distress.    Appearance: Normal appearance. She is not diaphoretic.  HENT:     Head: Normocephalic and atraumatic.  Eyes:     General: No scleral icterus.    Conjunctiva/sclera: Conjunctivae normal.  Neck:     Musculoskeletal: Neck supple.  Cardiovascular:     Rate and Rhythm: Normal rate and regular rhythm.     Pulses: Normal pulses.     Heart sounds: Normal heart sounds. No murmur.  Pulmonary:      Effort: Pulmonary effort is normal. No respiratory distress.     Breath sounds: Normal breath sounds. No wheezing or rhonchi.  Musculoskeletal:     Right lower leg: No edema.     Left lower leg: No edema.  Skin:    General: Skin is warm and dry.     Capillary Refill: Capillary refill takes less than 2 seconds.     Findings: No rash.  Neurological:     Mental Status: She is alert and oriented to person, place, and time. Mental status is at baseline.  Psychiatric:        Mood and Affect: Mood normal.        Behavior: Behavior normal.         Assessment & Plan   Problem List Items Addressed This Visit      Other   Fibromyalgia (Chronic)    Chronic and stable Continue Cymbalta at current dose Also followed by pain management      Overweight - Primary    Patient did not tolerate Topamax She failed phentermine Discussed importance of diet and exercise She continues to insist that she is active at work and eats healthy Her BMI only recently dropped into the overweight range, not obese We will try Contrave She is not taking regular narcotics, so this is okay to use Follow-up in 3 months          Return in about 3 months (around 11/22/2018) for CPE.   The entirety of the information documented in the History of Present Illness, Review of Systems and Physical Exam were personally obtained by me. Portions of this information were initially documented by Tiburcio Pea, CMA and reviewed by me for thoroughness and accuracy.    Virginia Crews, MD, MPH [facility] 08/22/2018 10:35 AM

## 2018-08-22 NOTE — Assessment & Plan Note (Signed)
Patient did not tolerate Topamax She failed phentermine Discussed importance of diet and exercise She continues to insist that she is active at work and eats healthy Her BMI only recently dropped into the overweight range, not obese We will try Contrave She is not taking regular narcotics, so this is okay to use Follow-up in 3 months

## 2018-08-23 DIAGNOSIS — R200 Anesthesia of skin: Secondary | ICD-10-CM | POA: Diagnosis not present

## 2018-09-09 ENCOUNTER — Telehealth: Payer: Self-pay

## 2018-09-10 NOTE — Telephone Encounter (Signed)
CNL

## 2018-09-17 ENCOUNTER — Ambulatory Visit: Payer: 59 | Admitting: Pain Medicine

## 2018-10-07 DIAGNOSIS — I10 Essential (primary) hypertension: Secondary | ICD-10-CM | POA: Diagnosis not present

## 2018-10-07 DIAGNOSIS — N183 Chronic kidney disease, stage 3 (moderate): Secondary | ICD-10-CM | POA: Diagnosis not present

## 2018-10-08 MED FILL — LANSOPRAZOLE 30 MG CPDR: 30 | 30 days supply | Qty: 60 | Fill #0

## 2018-10-08 MED FILL — LINZESS 290 MCG CAPSULE: 290 | 90 days supply | Qty: 90 | Fill #0

## 2018-10-10 ENCOUNTER — Encounter: Payer: Self-pay | Admitting: Pain Medicine

## 2018-10-10 ENCOUNTER — Encounter: Payer: Self-pay | Admitting: Family Medicine

## 2018-10-10 DIAGNOSIS — K760 Fatty (change of) liver, not elsewhere classified: Secondary | ICD-10-CM

## 2018-10-10 DIAGNOSIS — I10 Essential (primary) hypertension: Secondary | ICD-10-CM

## 2018-10-15 NOTE — Telephone Encounter (Signed)
Called patient to further discuss issues that she contacted [place] over concerning a swelling in her neck.  Voicemail left with her that I have spoken with Dr Dossie Arbour and he would prefer to either see her in the office or have her f/up with PCP or ED, her choice.  Also, concerning as to why she is contacting us now since this issue began in February.  Asked patient to please call in to office so we can discuss further.

## 2018-10-18 ENCOUNTER — Ambulatory Visit (INDEPENDENT_AMBULATORY_CARE_PROVIDER_SITE_OTHER): Payer: 59 | Admitting: Physician Assistant

## 2018-10-18 ENCOUNTER — Other Ambulatory Visit: Payer: Self-pay

## 2018-10-18 ENCOUNTER — Encounter: Payer: Self-pay | Admitting: Family Medicine

## 2018-10-18 ENCOUNTER — Encounter: Payer: Self-pay | Admitting: Physician Assistant

## 2018-10-18 VITALS — BP 169/77 | HR 66 | Temp 98.9°F | Resp 16 | Wt 164.2 lb

## 2018-10-18 DIAGNOSIS — G5603 Carpal tunnel syndrome, bilateral upper limbs: Secondary | ICD-10-CM | POA: Diagnosis not present

## 2018-10-18 MED ORDER — METHYLPREDNISOLONE 4 MG PO TBPK: ORAL_TABLET | ORAL | 0 refills | Status: DC

## 2018-10-18 NOTE — Progress Notes (Signed)
Patient: Elizabeth Bowers Female    DOB: 19-Dec-1965   53 y.o.   MRN: [id number] Visit Date: 10/18/2018  Today's Provider: Mar Daring, PA-C   Chief Complaint  Patient presents with  . Wrist Pain   Subjective:     HPI  Patient here with c/o left wrist pain. No injury.She reports that it started on Sunday night and couldn't lift her hand up. She is now able to lift her hand but is not able to hold anything with her hand. Treatments tried: brace and iced. Reports that is getting swollen and bruised. She took Percocet from an old prescription (07/2018).   Allergies  Allergen Reactions  . Ranitidine Diarrhea and Other (See Comments)    Diarrhea and stomach cramping  . Amphetamine-Dextroamphetamine Other (See Comments)    Elevated BP  . Lactose Intolerance (Gi)     bloating  . Reglan [Metoclopramide] Other (See Comments)    Increased prolactin level   . Topiramate Er Other (See Comments)    Severe headache, nausea, vomiting, dizziness, visual disturbance    . Wheat Bran     bloating  . Sulfa Antibiotics Anxiety     Current Outpatient Medications:  .  docusate sodium (COLACE) 100 MG capsule, Take 200 mg by mouth at bedtime. , Disp: , Rfl:  .  DULoxetine (CYMBALTA) 60 MG capsule, Take 1 capsule (60 mg total) by mouth daily., Disp: 90 capsule, Rfl: 3 .  lansoprazole (PREVACID) 30 MG capsule, Take 1 capsule (30 mg total) by mouth 2 (two) times daily before a meal., Disp: 180 capsule, Rfl: 0 .  LINZESS 290 MCG CAPS capsule, Take 1 capsule (290 mcg total) by mouth daily., Disp: 90 capsule, Rfl: 3 .  Multiple Vitamin (MULTIVITAMIN WITH MINERALS) TABS tablet, Take 1 tablet by mouth daily., Disp: , Rfl:  .  Naltrexone-buPROPion HCl ER 8-90 MG TB12, Take 1 tab PO daily x7d, then 1 tab BID x7d, then 2 tab qAM and 1 tab qPM x7d, and then 2 tabs PO BID x7d, Disp: 70 tablet, Rfl: 0 .  oxyCODONE-acetaminophen (PERCOCET/ROXICET) 5-325 MG tablet, , Disp: , Rfl:  .  potassium  chloride SA (K-DUR,KLOR-CON) 20 MEQ tablet, TAKE 1 TABLET BY MOUTH DAILY., Disp: 30 tablet, Rfl: 5 .  triamterene-hydrochlorothiazide (MAXZIDE-25) 37.5-25 MG tablet, Take 1 tablet by mouth daily., Disp: 90 tablet, Rfl: 3 .  vitamin C (ASCORBIC ACID) 500 MG tablet, Take 500 mg by mouth daily., Disp: , Rfl:   Review of Systems  Constitutional: Negative.   Respiratory: Negative.   Cardiovascular: Negative.   Musculoskeletal: Positive for arthralgias and joint swelling.  Neurological: Positive for weakness and numbness.  Hematological: Bruises/bleeds easily.    Social History   Tobacco Use  . Smoking status: Former Smoker    Packs/day: 0.75    Years: 5.00    Pack years: 3.75    Types: Cigarettes    Last attempt to quit: 07/06/1987    Years since quitting: 31.3  . Smokeless tobacco: Never Used  Substance Use Topics  . Alcohol use: Yes    Alcohol/week: 2.0 - 4.0 standard drinks    Types: 2 - 4 Cans of beer per week    Comment: wine cooler      Objective:   BP (!) 169/77 (BP Location: Right Arm, Patient Position: Sitting, Cuff Size: Large)   Pulse 66   Temp 98.9 F (37.2 C) (Oral)   Resp 16   Wt 164 lb 3.2  oz (74.5 kg)   BMI 30.03 kg/m  Vitals:   10/18/18 1414  BP: (!) 169/77  Pulse: 66  Resp: 16  Temp: 98.9 F (37.2 C)  TempSrc: Oral  Weight: 164 lb 3.2 oz (74.5 kg)     Physical Exam Vitals signs reviewed.  Constitutional:      General: She is not in acute distress.    Appearance: Normal appearance. She is well-developed. She is not ill-appearing or diaphoretic.  Neck:     Musculoskeletal: Normal range of motion and neck supple.  Cardiovascular:     Rate and Rhythm: Normal rate and regular rhythm.     Heart sounds: Normal heart sounds. No murmur. No friction rub. No gallop.   Pulmonary:     Effort: Pulmonary effort is normal. No respiratory distress.     Breath sounds: Normal breath sounds. No wheezing or rales.  Musculoskeletal:     Comments: Bilateral  wrist pain with numbness in the middle finger, index finger and thumbs. The numbness comes and goes but is persistent in the left hand middle finger and thumb. Positive tinel and phalen sign. Bruising on the left wrist over the palmaris longus tendon that I suspect to be from the brace being tight and possible causing rupture of one of the superficial veins that run overlying the palmaris longus tendon  Neurological:     Mental Status: She is alert.         Assessment & Plan    1. Bilateral carpal tunnel syndrome Continue bracing, ice after activity. Medrol dose pak as below. Will refer to Ortho for further evaluation. Call if worsening. - Ambulatory referral to Orthopedic Surgery - methylPREDNISolone (MEDROL) 4 MG TBPK tablet; 6 day taper; take as directed on package instructions  Dispense: 21 tablet; Refill: 0     Mar Daring, PA-C  [facility]

## 2018-10-18 NOTE — Patient Instructions (Signed)
Carpal Tunnel Syndrome    Carpal tunnel syndrome is a condition that causes pain in your hand and arm. The carpal tunnel is a narrow area that is on the palm side of your wrist. Repeated wrist motion or certain diseases may cause swelling in the tunnel. This swelling can pinch the main nerve in the wrist (median nerve).  What are the causes?  This condition may be caused by:   Repeated wrist motions.   Wrist injuries.   Arthritis.   A sac of fluid (cyst) or abnormal growth (tumor) in the carpal tunnel.   Fluid buildup during pregnancy.  Sometimes the cause is not known.  What increases the risk?  The following factors may make you more likely to develop this condition:   Having a job in which you move your wrist in the same way many times. This includes jobs like being a butcher or a cashier.   Being a woman.   Having other health conditions, such as:  ? Diabetes.  ? Obesity.  ? A thyroid gland that is not active enough (hypothyroidism).  ? Kidney failure.  What are the signs or symptoms?  Symptoms of this condition include:   A tingling feeling in your fingers.   Tingling or a loss of feeling (numbness) in your hand.   Pain in your entire arm. This pain may get worse when you bend your wrist and elbow for a long time.   Pain in your wrist that goes up your arm to your shoulder.   Pain that goes down into your palm or fingers.   A weak feeling in your hands. You may find it hard to grab and hold items.  You may feel worse at night.  How is this diagnosed?  This condition is diagnosed with a medical history and physical exam. You may also have tests, such as:   Electromyogram (EMG). This test checks the signals that the nerves send to the muscles.   Nerve conduction study. This test checks how well signals pass through your nerves.   Imaging tests, such as X-rays, ultrasound, and MRI. These tests check for what might be the cause of your condition.  How is this treated?  This condition may be treated  with:   Lifestyle changes. You will be asked to stop or change the activity that caused your problem.   Doing exercise and activities that make bones and muscles stronger (physical therapy).   Learning how to use your hand again (occupational therapy).   Medicines for pain and swelling (inflammation). You may have injections in your wrist.   A wrist splint.   Surgery.  Follow these instructions at home:  If you have a splint:   Wear the splint as told by your doctor. Remove it only as told by your doctor.   Loosen the splint if your fingers:  ? Tingle.  ? Lose feeling (become numb).  ? Turn cold and blue.   Keep the splint clean.   If the splint is not waterproof:  ? Do not let it get wet.  ? Cover it with a watertight covering when you take a bath or a shower.  Managing pain, stiffness, and swelling     If told, put ice on the painful area:  ? If you have a removable splint, remove it as told by your doctor.  ? Put ice in a plastic bag.  ? Place a towel between your skin and the bag.  ? Leave the   ice on for 20 minutes, 2-3 times per day.  General instructions   Take over-the-counter and prescription medicines only as told by your doctor.   Rest your wrist from any activity that may cause pain. If needed, talk with your boss at work about changes that can help your wrist heal.   Do any exercises as told by your doctor, physical therapist, or occupational therapist.   Keep all follow-up visits as told by your doctor. This is important.  Contact a doctor if:   You have new symptoms.   Medicine does not help your pain.   Your symptoms get worse.  Get help right away if:   You have very bad numbness or tingling in your wrist or hand.  Summary   Carpal tunnel syndrome is a condition that causes pain in your hand and arm.   It is often caused by repeated wrist motions.   Lifestyle changes and medicines are used to treat this problem. Surgery may help in very bad cases.   Follow your doctor's  instructions about wearing a splint, resting your wrist, keeping follow-up visits, and calling for help.  This information is not intended to replace advice given to you by your health care provider. Make sure you discuss any questions you have with your health care provider.  Document Released: 05/11/2011 Document Revised: 09/28/2017 Document Reviewed: 09/28/2017  Elsevier Interactive Patient Education  2019 [employer]

## 2018-10-21 DIAGNOSIS — G5602 Carpal tunnel syndrome, left upper limb: Secondary | ICD-10-CM | POA: Diagnosis not present

## 2018-10-30 ENCOUNTER — Encounter: Payer: 59 | Admitting: Family Medicine

## 2018-11-14 MED FILL — DULOXETINE HCL 60 MG CPEP: 60 | 90 days supply | Qty: 90 | Fill #0

## 2018-11-14 MED FILL — LANSOPRAZOLE 30 MG CPDR: 30 | 30 days supply | Qty: 60 | Fill #1

## 2018-12-03 DIAGNOSIS — H1031 Unspecified acute conjunctivitis, right eye: Secondary | ICD-10-CM | POA: Diagnosis not present

## 2018-12-03 MED FILL — TRIAMTERENE/HCTZ 37.5/25 TB: 37.5-25 | 90 days supply | Qty: 90 | Fill #0

## 2018-12-10 ENCOUNTER — Encounter: Payer: Self-pay | Admitting: Gastroenterology

## 2018-12-10 ENCOUNTER — Other Ambulatory Visit: Payer: Self-pay | Admitting: Gastroenterology

## 2018-12-10 DIAGNOSIS — K219 Gastro-esophageal reflux disease without esophagitis: Secondary | ICD-10-CM

## 2018-12-17 ENCOUNTER — Telehealth: Payer: Self-pay | Admitting: Gastroenterology

## 2018-12-17 MED FILL — TRIAMTERENE/HCTZ 37.5/25 TB: 37.5-25 | 90 days supply | Qty: 90 | Fill #0

## 2018-12-17 NOTE — Telephone Encounter (Signed)
Patient called & made an appointment for a refill on lansoprazole (PREVACID) 30 MG. Patient has an appointment with her on 01-21-19. Can you call in enough to last until her appointment to the [place].Please notify patient.

## 2018-12-19 ENCOUNTER — Other Ambulatory Visit: Payer: Self-pay

## 2018-12-19 DIAGNOSIS — K219 Gastro-esophageal reflux disease without esophagitis: Secondary | ICD-10-CM

## 2018-12-19 MED ORDER — LANSOPRAZOLE 30 MG PO CPDR: 30.0000 mg | DELAYED_RELEASE_CAPSULE | Freq: Two times a day (BID) | ORAL | 0 refills | Status: DC

## 2018-12-19 MED FILL — LANSOPRAZOLE 30 MG CPDR: 30 | 30 days supply | Qty: 60 | Fill #0

## 2018-12-19 NOTE — Telephone Encounter (Signed)
30-day supply of medication (Prevacid) has been sent to pharmacy, pt has been notified

## 2019-01-01 NOTE — Progress Notes (Signed)
Patient: Elizabeth Bowers, Female    DOB: 1965/09/11, 53 y.o.   MRN: [id number] Visit Date: 01/02/2019  Today's Provider: Lavon Paganini, MD   Chief Complaint  Patient presents with  . Annual Exam   Subjective:    Annual physical exam Elizabeth Bowers is a 53 y.o. female who presents today for health maintenance and complete physical. She feels well. She reports exercising at work by walking. She reports she is sleeping fairly well but she is tossing and turning.   Small raised area on scalp that is itchy and painful to the touch for 1 month.  Had one previously and was sent to [facility] and had it frozen.  ----------------------------------------------------------------- Last mammogram:05/06/2018 Last colonoscopy:12/14/2016 - repeat 3 yrs  Review of Systems  All other systems reviewed and are negative.   Social History She  reports that she quit smoking about 31 years ago. Elizabeth Bowers smoking use included cigarettes. She has a 3.75 pack-year smoking history. She has never used smokeless tobacco. She reports current alcohol use of about 2.0 - 4.0 standard drinks of alcohol per week. She reports that she does not use drugs. Social History   Socioeconomic History  . Marital status: Married    Spouse name: Kyung Rudd  . Number of children: 6  . Years of education: 29  . Highest education level: Associate degree: academic program  Occupational History  . Occupation: Microbiologist: [employer]  . Financial resource strain: Not hard at all  . Food insecurity    Worry: Never true    Inability: Never true  . Transportation needs    Medical: No    Non-medical: No  Tobacco Use  . Smoking status: Former Smoker    Packs/day: 0.75    Years: 5.00    Pack years: 3.75    Types: Cigarettes    Quit date: 07/06/1987    Years since quitting: 31.5  . Smokeless tobacco: Never Used  Substance and Sexual Activity  . Alcohol use: Yes    Alcohol/week: 2.0 - 4.0  standard drinks    Types: 2 - 4 Cans of beer per week    Comment: wine cooler  . Drug use: Never  . Sexual activity: Not on file  Lifestyle  . Physical activity    Days per week: 0 days    Minutes per session: 0 min  . Stress: Not on file  Relationships  . Social Herbalist on phone: Not on file    Gets together: Not on file    Attends religious service: Not on file    Active member of club or organization: Not on file    Attends meetings of clubs or organizations: Not on file    Relationship status: Not on file  Other Topics Concern  . Not on file  Social History Narrative  . Not on file    Patient Active Problem List   Diagnosis Date Noted  . Chronic low back pain (Bilateral) w/o sciatica 08/08/2018  . Acute postoperative pain 07/23/2018  . Abnormal MRI, cervical spine (07/10/2018) 07/17/2018  . Cervical spondylitis with radiculitis (HCC) (C6) (Bilateral) (R>L) 06/26/2018  . Chronic sacroiliac joint pain (Bilateral) (R>L) 06/26/2018  . Cervical Grade 1 (2 mm) Retrolisthesis C5 over C6 03/20/2018  . Cervical foraminal stenosis (C3-4 & C5-6) (Bilateral) 03/20/2018  . DDD (degenerative disc disease), cervical 03/20/2018  . Long term prescription benzodiazepine use 03/20/2018  . CKD (  chronic kidney disease) stage 3, GFR 30-59 ml/min ([place]) 03/20/2018  . Acid reflux disease 03/20/2018  . Spondylosis without myelopathy or radiculopathy, cervical region 03/20/2018  . Chronic hip pain (Bilateral) (L>R) 03/20/2018  . Lumbar facet syndrome (Bilateral) (R>L) 03/20/2018  . Spondylosis without myelopathy or radiculopathy, lumbar region 03/20/2018  . Chronic Sacroiliac joint dysfunction (Bilateral) (R>L) 03/20/2018  . Chronic Somatic dysfunction of sacroiliac joint (Bilateral) (R>L) 03/20/2018  . Osteoarthritis of hip (Bilateral) 03/20/2018  . Cervicogenic headache (Bilateral) 03/20/2018  . Cervicalgia (Bilateral) (R>L) 03/20/2018  . Cervical facet syndrome (Bilateral) (R>L)  03/20/2018  . Chronic occipital neuralgia (Bilateral) 03/20/2018  . Chronic upper extremity pain (Fourth Area of Pain) (Bilateral) (R>L) 03/19/2018  . Ganglion cyst 02/20/2018  . Dyspepsia   . Chronic low back pain (Secondary Area of Pain) (Bilateral) (L>R) w/ sciatica (Bilateral) 01/16/2018  . Chronic lower extremity pain [facility] Area of Pain) (Bilateral) (L>R) 01/16/2018  . Chronic neck pain (Primary Area of Pain) (Bilateral) (midline) 01/16/2018  . Chronic foot pain (Left) 01/16/2018  . Chronic pain syndrome 01/16/2018  . Pharmacologic therapy 01/16/2018  . Disorder of skeletal system 01/16/2018  . Problems influencing health status 01/16/2018  . Obesity 11/21/2017  . Anxiety 09/28/2017  . Chronic knee pain (Right) 09/28/2017  . Fatty liver 07/09/2017  . Colon polyps 07/09/2017  . Fibromyalgia 07/09/2017  . Sinusitis, chronic 07/09/2017  . Allergic rhinitis 07/09/2017  . IBS (irritable bowel syndrome) 07/09/2017  . Chronic GERD 07/09/2017  . Eyelid dermatitis, allergic/contact 07/09/2017  . Hypertension 01/12/2017  . Superficial thrombophlebitis 01/12/2017  . Rectal bleeding 12/16/2016  . Chronic sacroiliac joint pain (Right) 12/16/2016  . DDD (degenerative disc disease), lumbar 07/18/2016  . Family hx of colon cancer 07/18/2016    Past Surgical History:  Procedure Laterality Date  . ABDOMINAL HYSTERECTOMY     2012 removed cervix h/o abnormal pap   . CHOLECYSTECTOMY     2000  . COLONOSCOPY     2018 with + polpys and GIB 2/2 polyp removal   . ELBOW DEBRIDEMENT    . ESOPHAGOGASTRODUODENOSCOPY (EGD) WITH PROPOFOL N/A 02/06/2018   Procedure: ESOPHAGOGASTRODUODENOSCOPY (EGD) WITH PROPOFOL with biopsies;  Surgeon: Lin Landsman, MD;  Location: [place];  Service: Endoscopy;  Laterality: N/A;  . FOOT SURGERY      Family History  Family Status  Relation Name Status  . Mother  Alive  . Father  Deceased  . Sister  Deceased  . Sister  Alive  . Brother   Alive  . Daughter  Alive  . Son [employer]  . MGM  Deceased  . PGM  (Not Specified)  . PGF  (Not Specified)  . Sister  Alive  . Son  Alive  . Son  (Not Specified)  . Neg Hx  (Not Specified)   Elizabeth Bowers family history includes Alcohol abuse in Elizabeth Bowers father and son; Arthritis in Elizabeth Bowers son and son; Asthma in Elizabeth Bowers sister; COPD in Elizabeth Bowers father, paternal grandmother, and sister; Cancer in Elizabeth Bowers paternal grandfather and sister; Colon cancer in Elizabeth Bowers sister; Colon polyps in Elizabeth Bowers sister; Depression in Elizabeth Bowers son; Diabetes in Elizabeth Bowers maternal grandmother, mother, and son; Drug abuse in Elizabeth Bowers son; Glaucoma in Elizabeth Bowers mother; Hyperlipidemia in Elizabeth Bowers mother; Hypertension in Elizabeth Bowers mother; Kidney disease in Elizabeth Bowers sister; Mental illness in Elizabeth Bowers sister and son; Miscarriages / Stillbirths in Elizabeth Bowers sister and sister; Uterine cancer in Elizabeth Bowers sister.     Allergies  Allergen Reactions  . Ranitidine Diarrhea and Other (See Comments)  Diarrhea and stomach cramping  . Amphetamine-Dextroamphetamine Other (See Comments)    Elevated BP  . Lactose Intolerance (Gi)     bloating  . Reglan [Metoclopramide] Other (See Comments)    Increased prolactin level   . Topiramate Er Other (See Comments)    Severe headache, nausea, vomiting, dizziness, visual disturbance    . Wheat Bran     bloating  . Sulfa Antibiotics Anxiety    Previous Medications   DOCUSATE SODIUM (COLACE) 100 MG CAPSULE    Take 200 mg by mouth at bedtime.    DULOXETINE (CYMBALTA) 60 MG CAPSULE    Take 1 capsule (60 mg total) by mouth daily.   LANSOPRAZOLE (PREVACID) 30 MG CAPSULE    Take 1 capsule (30 mg total) by mouth 2 (two) times daily before a meal.   LINZESS 290 MCG CAPS CAPSULE    Take 1 capsule (290 mcg total) by mouth daily.   MULTIPLE VITAMIN (MULTIVITAMIN WITH MINERALS) TABS TABLET    Take 1 tablet by mouth daily.   OXYCODONE-ACETAMINOPHEN (PERCOCET/ROXICET) 5-325 MG TABLET       TRIAMTERENE-HYDROCHLOROTHIAZIDE (MAXZIDE-25) 37.5-25 MG TABLET    Take 1 tablet by mouth daily.    VITAMIN C (ASCORBIC ACID) 500 MG TABLET    Take 500 mg by mouth daily.    Patient Care Team: Virginia Crews, MD as PCP - General (Family Medicine)      Objective:   Vitals: BP 118/74 (BP Location: Right Arm, Patient Position: Sitting, Cuff Size: Normal)   Pulse 73   Temp 98.7 F (37.1 C) (Oral)   Resp 18   Wt 167 lb 12.8 oz (76.1 kg)   SpO2 98%   BMI 30.69 kg/m    Physical Exam Vitals signs reviewed.  Constitutional:      General: She is not in acute distress.    Appearance: Normal appearance. She is well-developed. She is not diaphoretic.  HENT:     Head: Normocephalic and atraumatic.     Right Ear: Tympanic membrane, ear canal and external ear normal.     Left Ear: Tympanic membrane, ear canal and external ear normal.  Eyes:     General: No scleral icterus.    Conjunctiva/sclera: Conjunctivae normal.     Pupils: Pupils are equal, round, and reactive to light.  Neck:     Musculoskeletal: Neck supple.     Thyroid: No thyromegaly.  Cardiovascular:     Rate and Rhythm: Normal rate and regular rhythm.     Pulses: Normal pulses.     Heart sounds: Normal heart sounds. No murmur.  Pulmonary:     Effort: Pulmonary effort is normal. No respiratory distress.     Breath sounds: Normal breath sounds. No wheezing or rales.  Abdominal:     General: Bowel sounds are normal. There is no distension.     Palpations: Abdomen is soft.     Tenderness: There is no abdominal tenderness. There is no guarding or rebound.  Genitourinary:    Comments: Breasts: breasts appear normal, no suspicious masses, no skin or nipple changes or axillary nodes.  Musculoskeletal:        General: No deformity.     Right lower leg: No edema.     Left lower leg: No edema.  Lymphadenopathy:     Cervical: No cervical adenopathy.  Skin:    General: Skin is warm and dry.     Capillary Refill: Capillary refill takes less than 2 seconds.     Findings:  No rash.     Comments: No lesion on scalp   Neurological:     Mental Status: She is alert and oriented to person, place, and time. Mental status is at baseline.  Psychiatric:        Mood and Affect: Mood normal.        Behavior: Behavior normal.        Thought Content: Thought content normal.      Depression Screen PHQ 2/9 Scores 01/02/2019 10/24/2018 10/24/2018 08/08/2018  PHQ - 2 Score 2 2 2 1   PHQ- 9 Score 9 9 8 4       Assessment & Plan:     Routine Health Maintenance and Physical Exam  Exercise Activities and Dietary recommendations Goals   None     Immunization History  Administered Date(s) Administered  . Influenza-Unspecified 02/14/2018  . Tdap 03/05/2017    Health Maintenance  Topic Date Due  . INFLUENZA VACCINE  01/04/2019  . COLONOSCOPY  12/15/2019  . MAMMOGRAM  05/06/2020  . TETANUS/TDAP  03/06/2027  . HIV Screening  Completed     Discussed health benefits of physical activity, and encouraged Elizabeth Bowers to engage in regular exercise appropriate for Elizabeth Bowers age and condition.    --------------------------------------------------------------------   Problem List Items Addressed This Visit      Cardiovascular and Mediastinum   Hypertension    Well controlled Continue current medications Recheck metabolic panel F/u in 6 months       Relevant Orders   Comprehensive metabolic panel     Digestive   Fatty liver   Relevant Orders   Comprehensive metabolic panel   Acid reflux disease    Continue PPI        Other   Obesity    Discussed importance of healthy weight management Discussed diet and exercise        Other Visit Diagnoses    Encounter for annual physical exam    -  Primary   Relevant Orders   Hemoglobin A1c   Lipid panel   Comprehensive metabolic panel   Hyperglycemia       Relevant Orders   Hemoglobin A1c       Return in about 6 months (around 07/05/2019) for chronic disease f/u.   The entirety of the information documented in the History of Present Illness, Review of  Systems and Physical Exam were personally obtained by me. Portions of this information were initially documented by [facility] [place] and [place] and reviewed by me for thoroughness and accuracy.    Adison Jerger, [name], MD MPH [facility]

## 2019-01-02 ENCOUNTER — Ambulatory Visit (INDEPENDENT_AMBULATORY_CARE_PROVIDER_SITE_OTHER): Payer: 59 | Admitting: Family Medicine

## 2019-01-02 ENCOUNTER — Other Ambulatory Visit: Payer: Self-pay

## 2019-01-02 ENCOUNTER — Encounter: Payer: Self-pay | Admitting: Family Medicine

## 2019-01-02 VITALS — BP 118/74 | HR 73 | Temp 98.7°F | Resp 18 | Wt 167.8 lb

## 2019-01-02 DIAGNOSIS — K219 Gastro-esophageal reflux disease without esophagitis: Secondary | ICD-10-CM | POA: Diagnosis not present

## 2019-01-02 DIAGNOSIS — Z6830 Body mass index (BMI) 30.0-30.9, adult: Secondary | ICD-10-CM

## 2019-01-02 DIAGNOSIS — K760 Fatty (change of) liver, not elsewhere classified: Secondary | ICD-10-CM

## 2019-01-02 DIAGNOSIS — R739 Hyperglycemia, unspecified: Secondary | ICD-10-CM | POA: Diagnosis not present

## 2019-01-02 DIAGNOSIS — E669 Obesity, unspecified: Secondary | ICD-10-CM

## 2019-01-02 DIAGNOSIS — I10 Essential (primary) hypertension: Secondary | ICD-10-CM

## 2019-01-02 DIAGNOSIS — Z0000 Encounter for general adult medical examination without abnormal findings: Secondary | ICD-10-CM

## 2019-01-02 NOTE — Assessment & Plan Note (Signed)
Well controlled Continue current medications Recheck metabolic panel F/u in 6 months  

## 2019-01-02 NOTE — Patient Instructions (Signed)

## 2019-01-02 NOTE — Assessment & Plan Note (Signed)
Continue PPI ?

## 2019-01-02 NOTE — Assessment & Plan Note (Signed)
Discussed importance of healthy weight management Discussed diet and exercise  

## 2019-01-03 LAB — LIPID PANEL
Chol/HDL Ratio: 2.2 ratio (ref 0.0–4.4)
Cholesterol, Total: 119 mg/dL (ref 100–199)
HDL: 55 mg/dL (ref >39)
LDL Calculated: 41 mg/dL (ref 0–99)
Triglycerides: 113 mg/dL (ref 0–149)
VLDL Cholesterol Cal: 23 mg/dL (ref 5–40)

## 2019-01-03 LAB — COMPREHENSIVE METABOLIC PANEL
ALT: 27 IU/L (ref 0–32)
AST: 25 IU/L (ref 0–40)
Albumin/Globulin Ratio: 2.2 (ref 1.2–2.2)
Albumin: 4.4 g/dL (ref 3.8–4.9)
Alkaline Phosphatase: 78 IU/L (ref 39–117)
BUN/Creatinine Ratio: 11 (ref 9–23)
BUN: 12 mg/dL (ref 6–24)
Bilirubin Total: 0.4 mg/dL (ref 0.0–1.2)
CO2: 26 mmol/L (ref 20–29)
Calcium: 9.4 mg/dL (ref 8.7–10.2)
Chloride: 99 mmol/L (ref 96–106)
Creatinine, Ser: 1.14 mg/dL — ABNORMAL HIGH (ref 0.57–1.00)
GFR calc Af Amer: 63 mL/min/{1.73_m2} (ref >59)
GFR calc non Af Amer: 55 mL/min/{1.73_m2} — ABNORMAL LOW (ref >59)
Globulin, Total: 2 g/dL (ref 1.5–4.5)
Glucose: 90 mg/dL (ref 65–99)
Potassium: 3.6 mmol/L (ref 3.5–5.2)
Sodium: 141 mmol/L (ref 134–144)
Total Protein: 6.4 g/dL (ref 6.0–8.5)

## 2019-01-03 LAB — HEMOGLOBIN A1C
Est. average glucose Bld gHb Est-mCnc: 100 mg/dL
Hgb A1c MFr Bld: 5.1 % (ref 4.8–5.6)

## 2019-01-13 DIAGNOSIS — M25532 Pain in left wrist: Secondary | ICD-10-CM | POA: Insufficient documentation

## 2019-01-13 DIAGNOSIS — M654 Radial styloid tenosynovitis [de Quervain]: Secondary | ICD-10-CM | POA: Insufficient documentation

## 2019-01-13 DIAGNOSIS — M79641 Pain in right hand: Secondary | ICD-10-CM | POA: Insufficient documentation

## 2019-01-13 DIAGNOSIS — G8929 Other chronic pain: Secondary | ICD-10-CM | POA: Insufficient documentation

## 2019-01-13 DIAGNOSIS — G5603 Carpal tunnel syndrome, bilateral upper limbs: Secondary | ICD-10-CM | POA: Insufficient documentation

## 2019-01-21 ENCOUNTER — Encounter: Payer: Self-pay | Admitting: Gastroenterology

## 2019-01-21 ENCOUNTER — Ambulatory Visit (INDEPENDENT_AMBULATORY_CARE_PROVIDER_SITE_OTHER): Payer: 59 | Admitting: Gastroenterology

## 2019-01-21 ENCOUNTER — Other Ambulatory Visit: Payer: Self-pay

## 2019-01-21 VITALS — BP 128/83 | HR 70 | Temp 96.1°F | Resp 17 | Wt 171.6 lb

## 2019-01-21 DIAGNOSIS — K5904 Chronic idiopathic constipation: Secondary | ICD-10-CM

## 2019-01-21 DIAGNOSIS — K219 Gastro-esophageal reflux disease without esophagitis: Secondary | ICD-10-CM

## 2019-01-21 MED ORDER — LINZESS 290 MCG PO CAPS: 290.0000 ug | ORAL_CAPSULE | Freq: Every day | ORAL | 3 refills | Status: DC

## 2019-01-21 MED ORDER — LANSOPRAZOLE 30 MG PO CPDR: 30.0000 mg | DELAYED_RELEASE_CAPSULE | Freq: Two times a day (BID) | ORAL | 3 refills | Status: DC

## 2019-01-21 MED FILL — LINZESS 290 MCG CAPSULE: 290 | 90 days supply | Qty: 90 | Fill #0

## 2019-01-21 MED FILL — LANSOPRAZOLE 30 MG CPDR: 30 | 90 days supply | Qty: 180 | Fill #0

## 2019-01-21 NOTE — Progress Notes (Signed)
Cephas Darby, MD [address]  [place]  [place],  [address]  Main: [phone]  Fax: [phone]    Gastroenterology Consultation  Referring Provider:     Virginia Crews, MD Primary Care Physician:  Virginia Crews, MD Primary Gastroenterologist:  Dr. Cephas Darby Reason for Consultation:     GERD, dyspepsia and constipation        HPI:   Elizabeth Bowers is a 53 y.o. female referred by Dr. Brita Romp, [name], MD  for consultation & management of chronic GERD, bloating, epigastric pain and constipation  Chronic GERD: with erosive esophagitis Patient has been suffering from acid reflux for several years. She tried maximum doses of Prevacid, Protonix, Nexium, Prilosec and zegerid. When she found out about erosive esophagitis, she switched to Dexilant 60 mg twice daily which helped in healing of erosive esophagitis confirmed by EGD in 2018. She moved to [place] and changed her health insurance, therefore here to establish care with new GI. She is currently employed as a Statistician at [employer]. She reports that taking Dexilant 60 mg twice daily is keeping her symptoms under control.  Epigastric pain and bloating: these symptoms started when she turned 53 years old, around menopause. She figured that avoiding milk and wheat helps with bloating significantly. She tells me that whenever she eats a piece of bread or drinks milk, leads to severe bloating and upper abdominal discomfort that she doubles over from pain. She has eliminated wheat and diary as much as she could from her routine. It is unclear whether she was tested for celiac disease or H. Pylori infection although patient denies. She consumes red meat about 2 times a week, drinks Dr. Malachi Bonds 1-2 drinks daily to keep her up during night shifts. She does have ongoing mild epigastric discomfort and bloating  Chronic constipation: she is currently managed with linaclotide  290 MCG daily and takes Colace additionally as needed. She thinks this medication is fairly keeping the constipation regulated. TSH normal. She reports that she gained significant weight in the process of trying different medications for fibromyalgia  Follow-up visit 12/26/2017 She started taking Topamax and phentermine for weight loss and lost about 10 pounds. She used to take this medication every day which resulted in rapid weight loss. Currently, taking once a week or so and losing about 1 pound per week. She continues to have symptoms of significant bloating, frequent burping, gas when she consumes even smallest amount of wheat. She tried to reintroduce a slice of bread few times a week but her symptoms got worse. she tried FD gaurd with no benefit, She is currently on Prevacid 30 mg twice daily which provides modest relief of heartburn and regurgitation. Her constipation is fairly regulated on linaclotide and Colace. She works as a Statistician at [employer], used to do night shifts. Currently, she'll be working during the day only. She has cut back on drinking Dr. Malachi Bonds. Her symptoms of fibromyalgia are fairly controlled on Cymbalta. She also has history of migraine headaches  Follow-up visit 08/01/2018 She underwent EGD which was unremarkable including esophageal, gastric and duodenal biopsies.  She is currently taking Prevacid 30 mg twice daily.  She is following low FODMAPs diet and has seen significant improvement in her symptoms of dyspepsia and GERD.  She actually lost about 10 pounds which she regained back.  Overall, she thinks are manageable.  She is also taking Linzess 290 MCG daily along  with Benefiber which is regulating her constipation.  She is seeing Dr. Stefani Dama for rising creatinine  Follow-up visit 01/21/2019 She has been doing very well on current therapy.  She is trying low FODMAPs diet which she finds it beneficial in controlling her symptoms.  Her  alpha gal panel and food allergy profile came back negative.  She continues to take Prevacid 30 mg twice daily as well as Linzess 290 MCG daily with Benefiber.  She does not have any concerns today  NSAIDs: none  Antiplts/Anticoagulants/Anti thrombotics: none Family history of colon cancer Her sister passed away from colon cancer at age 27 Maternal uncles had colon cancer  GI Procedures:  EGD and colonoscopy 12/14/16 at [facility] Procedure report not available A.  GE junction, endoscopic biopsy: Squamocolumnar junction mucosa with no significant pathologic finding. Negative for goblet cell intestinal metaplasia.  B.  Cecal polyp, endoscopic polypectomy: Sessile serrated adenoma. Negative for conventional dysplasia.  Past Medical History:  Diagnosis Date  . Fatty liver   . Fibromyalgia   . GERD (gastroesophageal reflux disease)   . Hemorrhoids   . Hot flashes   . Hypertension   . IBS (irritable bowel syndrome)   . LUQ abdominal pain 10/25/2016  . Migraines   . Miscarriage    x 2; 4 live births   . Multilevel degenerative disc disease   . Vocal cord dysfunction     Past Surgical History:  Procedure Laterality Date  . ABDOMINAL HYSTERECTOMY     2012 removed cervix h/o abnormal pap   . CHOLECYSTECTOMY     2000  . COLONOSCOPY     2018 with + polpys and GIB 2/2 polyp removal   . ELBOW DEBRIDEMENT    . ESOPHAGOGASTRODUODENOSCOPY (EGD) WITH PROPOFOL N/A 02/06/2018   Procedure: ESOPHAGOGASTRODUODENOSCOPY (EGD) WITH PROPOFOL with biopsies;  Surgeon: Lin Landsman, MD;  Location: [facility];  Service: Endoscopy;  Laterality: N/A;  . FOOT SURGERY      Current Outpatient Medications:  .  docusate sodium (COLACE) 100 MG capsule, Take 200 mg by mouth at bedtime. , Disp: , Rfl:  .  DULoxetine (CYMBALTA) 60 MG capsule, Take 1 capsule (60 mg total) by mouth daily., Disp: 90 capsule, Rfl: 3 .  LINZESS 290 MCG CAPS capsule, Take 1 capsule (290 mcg total) by mouth daily., Disp:  90 capsule, Rfl: 3 .  Multiple Vitamin (MULTIVITAMIN WITH MINERALS) TABS tablet, Take 1 tablet by mouth daily., Disp: , Rfl:  .  neomycin-polymyxin b-dexamethasone (MAXITROL) 3.5-10000-0.1 OINT, , Disp: , Rfl:  .  phentermine 37.5 MG capsule, phentermine 37.5 mg capsule, Disp: , Rfl:  .  triamterene-hydrochlorothiazide (MAXZIDE-25) 37.5-25 MG tablet, Take 1 tablet by mouth daily., Disp: 90 tablet, Rfl: 3 .  vitamin C (ASCORBIC ACID) 500 MG tablet, Take 500 mg by mouth daily., Disp: , Rfl:  .  amoxicillin (AMOXIL) 875 MG tablet, amoxicillin 875 mg tablet, Disp: , Rfl:  .  cephALEXin (KEFLEX) 500 MG capsule, cephalexin 500 mg capsule, Disp: , Rfl:  .  ketorolac (TORADOL) 10 MG tablet, ketorolac 10 mg tablet, Disp: , Rfl:  .  lansoprazole (PREVACID) 30 MG capsule, Take 1 capsule (30 mg total) by mouth 2 (two) times daily before a meal., Disp: 180 capsule, Rfl: 3 .  levocetirizine (XYZAL) 5 MG tablet, levocetirizine 5 mg tablet, Disp: , Rfl:  .  meloxicam (MOBIC) 15 MG tablet, meloxicam 15 mg tablet, Disp: , Rfl:  .  montelukast (SINGULAIR) 10 MG tablet, montelukast 10 mg tablet, Disp: ,  Rfl:  .  Naltrexone-buPROPion HCl ER (CONTRAVE) 8-90 MG TB12, Contrave 8 mg-90 mg tablet,extended release, Disp: , Rfl:  .  oxyCODONE-acetaminophen (PERCOCET/ROXICET) 5-325 MG tablet, , Disp: , Rfl:  .  potassium chloride SA (K-DUR) 20 MEQ tablet, potassium chloride ER 20 mEq tablet,extended release(part/cryst), Disp: , Rfl:  .  rizatriptan (MAXALT) 10 MG tablet, rizatriptan 10 mg tablet, Disp: , Rfl:  .  topiramate (TOPAMAX) 50 MG tablet, topiramate 50 mg tablet, Disp: , Rfl:    Family History  Problem Relation Age of Onset  . Hypertension Mother   . Hyperlipidemia Mother   . Glaucoma Mother   . Diabetes Mother        type 2   . COPD Father        emphysema  . Alcohol abuse Father   . Colon cancer Sister   . Cancer Sister        rectal/colon cancer no h/o IBD  . Colon polyps Sister   . Asthma Sister    . COPD Sister   . Kidney disease Sister   . Mental illness Sister        bipolar   . Miscarriages / Stillbirths Sister   . Uterine cancer Sister   . Alcohol abuse Son   . Depression Son   . Diabetes Son   . Drug abuse Son   . Mental illness Son        bipolar   . Diabetes Maternal Grandmother   . COPD Paternal Grandmother   . Cancer Paternal Grandfather        FH colon cancer maternal aunts/uncles   . Miscarriages / Stillbirths Sister   . Arthritis Son   . Arthritis Son   . Breast cancer Neg Hx      Social History   Tobacco Use  . Smoking status: Former Smoker    Packs/day: 0.75    Years: 5.00    Pack years: 3.75    Types: Cigarettes    Quit date: 07/06/1987    Years since quitting: 31.5  . Smokeless tobacco: Never Used  Substance Use Topics  . Alcohol use: Yes    Alcohol/week: 2.0 - 4.0 standard drinks    Types: 2 - 4 Cans of beer per week    Comment: wine cooler  . Drug use: Never    Allergies as of 01/21/2019 - Review Complete 01/21/2019  Allergen Reaction Noted  . Ranitidine Diarrhea and Other (See Comments) 08/30/2016  . Amphetamine-dextroamphetamine Other (See Comments) 05/10/2016  . Lactose intolerance (gi)  02/05/2018  . Reglan [metoclopramide] Other (See Comments) 12/15/2016  . Topiramate  01/21/2019  . Topiramate er Other (See Comments) 01/16/2018  . Wheat bran  02/05/2018  . Sulfa antibiotics Anxiety 12/15/2016    Review of Systems:    All systems reviewed and negative except where noted in HPI.   Physical Exam:  BP 128/83 (BP Location: Left Arm, Patient Position: Sitting, Cuff Size: Large)   Pulse 70   Temp (!) 96.1 F (35.6 C)   Resp 17   Wt 171 lb 9.6 oz (77.8 kg)   BMI 31.39 kg/m  No LMP recorded. Patient has had a hysterectomy.  General:   Alert,  Well-developed, well-nourished, pleasant and cooperative in NAD Head:  Normocephalic and atraumatic. Eyes:  Sclera clear, no icterus.   Conjunctiva pink. Ears:  Normal auditory acuity.  Nose:  No deformity, discharge, or lesions. Mouth:  No deformity or lesions,oropharynx pink & moist. Neck:  Supple; no masses  or thyromegaly. Lungs:  Respirations even and unlabored.  Clear throughout to auscultation.   No wheezes, crackles, or rhonchi. No acute distress. Heart:  Regular rate and rhythm; no murmurs, clicks, rubs, or gallops. Abdomen:  Normal bowel sounds. Soft, nontender, mildly distended from bloating without masses, hepatosplenomegaly or hernias noted.  No guarding or rebound tenderness.   Rectal: Not performed Msk:  Symmetrical without gross deformities. Good, equal movement & strength bilaterally. Pulses:  Normal pulses noted. Extremities:  No clubbing or edema.  No cyanosis. Neurologic:  Alert and oriented x3;  grossly normal neurologically. Skin:  Intact without significant lesions or rashes. No jaundice. Psych:  Alert and cooperative. Normal mood and affect.  Imaging Studies: reviewed  Assessment and Plan:   Elizabeth Bowers is a 53 y.o. Caucasian female with family history of colon cancer in first-degree relative, chronic GERD with erosive esophagitis, chronic dyspepsia and constipation. With her background history of fibromyalgia, migraines, anxiety her GI symptoms particularly symptoms of dyspepsia are probably functional  Chronic GERD Dexilant 60 mg twice daily is only medication that has resulted in resolution of her esophagitis Patient switched from [place] to Prevacid 30 mg twice daily with good improvement in symptoms. EGD with no evidence of eosinophilic esophagitis She also reports that she had pH impedance study performed several years ago and she was told that she is not a candidate for antireflux surgery.  She would like to defer repeat testing at this time  Chronic functional dyspepsia: There was no evidence of H. Pylori or celiac disease based on EGD in 09/2014 -food allergy profile, alpha gal panel negative -Continue low FODMAPS diet  Chronic  constipation: - continue linaclotide at the current dose with Colace as needed - High fiber diet and fiber supplements  Colon cancer in first-degree relative < 41 years of age - Last colonoscopy in 12/2016 revealed sessile serrated adenoma in the cecum which was 1 cm in size - Recommend repeat colonoscopy in 12/2019    Follow up in 1 year  Cephas Darby, MD

## 2019-01-23 ENCOUNTER — Other Ambulatory Visit: Payer: Self-pay

## 2019-01-23 ENCOUNTER — Ambulatory Visit: Payer: PRIVATE HEALTH INSURANCE | Attending: Orthopedic Surgery | Admitting: Occupational Therapy

## 2019-01-23 ENCOUNTER — Encounter: Payer: Self-pay | Admitting: Occupational Therapy

## 2019-01-23 DIAGNOSIS — M25532 Pain in left wrist: Secondary | ICD-10-CM | POA: Insufficient documentation

## 2019-01-23 DIAGNOSIS — M79642 Pain in left hand: Secondary | ICD-10-CM | POA: Diagnosis present

## 2019-01-23 DIAGNOSIS — R600 Localized edema: Secondary | ICD-10-CM | POA: Diagnosis present

## 2019-01-23 DIAGNOSIS — M25632 Stiffness of left wrist, not elsewhere classified: Secondary | ICD-10-CM | POA: Diagnosis present

## 2019-01-23 DIAGNOSIS — M25642 Stiffness of left hand, not elsewhere classified: Secondary | ICD-10-CM | POA: Diagnosis present

## 2019-01-23 DIAGNOSIS — M6281 Muscle weakness (generalized): Secondary | ICD-10-CM | POA: Diagnosis present

## 2019-01-23 NOTE — Patient Instructions (Signed)
Contrast - 3 x day   pain free AROM for wrist flexion ,ext, RD, UD  Tendon glides AROM - pain free  10 reps  Opposition 5 reps pain free -to 2nd fold of 5th  2 x day  Wear hard wrist splint work and driving  And at home neoprene wrap or wrist splint

## 2019-01-23 NOTE — Therapy (Signed)
[facility] [address], [place], [address] Phone: [phone]   Fax:  [phone]  Occupational Therapy Evaluation  Patient Details  Name: Elizabeth Bowers MRN: [id number] Date of Birth: 1966-04-04 Referring Provider (OT): Gramig   Encounter Date: 01/23/2019  OT End of Session - 01/23/19 1301    Visit Number  1    Number of Visits  8    Date for OT Re-Evaluation  02/20/19    OT Start Time  1105    OT Stop Time  1204    OT Time Calculation (min)  59 min    Activity Tolerance  Patient tolerated treatment well    Behavior During Therapy  [facility] for tasks assessed/performed       Past Medical History:  Diagnosis Date  . Fatty liver   . Fibromyalgia   . GERD (gastroesophageal reflux disease)   . Hemorrhoids   . Hot flashes   . Hypertension   . IBS (irritable bowel syndrome)   . LUQ abdominal pain 10/25/2016  . Migraines   . Miscarriage    x 2; 4 live births   . Multilevel degenerative disc disease   . Vocal cord dysfunction     Past Surgical History:  Procedure Laterality Date  . ABDOMINAL HYSTERECTOMY     2012 removed cervix h/o abnormal pap   . CHOLECYSTECTOMY     2000  . COLONOSCOPY     2018 with + polpys and GIB 2/2 polyp removal   . ELBOW DEBRIDEMENT    . ESOPHAGOGASTRODUODENOSCOPY (EGD) WITH PROPOFOL N/A 02/06/2018   Procedure: ESOPHAGOGASTRODUODENOSCOPY (EGD) WITH PROPOFOL with biopsies;  Surgeon: Lin Landsman, MD;  Location: [facility];  Service: Endoscopy;  Laterality: N/A;  . FOOT SURGERY      There were no vitals filed for this visit.  Subjective Assessment - 01/23/19 1250    Subjective   I hurt my wrist July 19th - had to help hold down combative pt for about hour - L hand on his shoulder weightbearing thru my palm and R hand on his foreram - pain and swelling started that night    Pertinent History  Pt injury date July 19th - had wrist splint at home from before and  wore that - seen Dr Amedeo Plenty on 01/13/2019 - splint to wear at work and driving - had xray - cont to have pain and some pins and neeldles morstly on radial side of dorsal hand and wrist    Patient Stated Goals  Want the pain and swelling better so I can use my hand like before - so I can do my job , knit , crochet, yardwork ,    Currently in Pain?  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 , likes to crochet, knit , sewing, reading , yard work       AROM   Right Wrist Extension  70 Degrees    Right Wrist Flexion  78 Degrees    Right Wrist Radial Deviation  25 Degrees    Right Wrist Ulnar Deviation  30 Degrees    Left Wrist Extension  58 Degrees   volar pull   Left Wrist Flexion  85 Degrees   radial side pull    Left Wrist Radial Deviation  25 Degrees    Left Wrist Ulnar Deviation  30 Degrees   pull radial wrist     Strength   Right Hand Grip (lbs)  65    Right Hand Lateral Pinch  15 lbs    Left Hand Grip (lbs)  25   pain    Left Hand Lateral Pinch  8 lbs   pain     Right Hand AROM   R Index  MCP 0-90  75 Degrees    R Long  MCP 0-90  75 Degrees    R Ring  MCP 0-90  80 Degrees    R Little  MCP 0-90  80 Degrees      Left Hand AROM   L Thumb Opposition to Index  --   Opposition to 2nd fold of 5th - pull    L Index  MCP 0-90  80 Degrees    L Index PIP 0-100  100 Degrees    L Long  MCP 0-90  80  Degrees    L Long PIP 0-100  100 Degrees    L Ring  MCP 0-90  75 Degrees    L Ring PIP 0-100  100 Degrees    L Little  MCP 0-90  75 Degrees    L Little PIP 0-100  100 Degrees        contrast done after eval and review HEP - hand out provided :    Contrast - 3 x day   pain free AROM for wrist flexion ,ext, RD, UD  Tendon glides AROM - pain free  10 reps  Opposition 5 reps pain free -to 2nd fold of 5th  2 x day  Wear hard wrist splint work and driving  And at home neoprene wrap or wrist splint               OT Education - 01/23/19 1300    Education Details  findings and HEP    Person(s) Educated  Patient    Methods  Explanation;Demonstration;Tactile cues;Verbal cues;Handout    Comprehension  Verbalized understanding;Returned demonstration       OT Short Term Goals - 01/23/19 1405      OT SHORT TERM GOAL #1   Title  Pain on PRWHE improve with more than 20 points    Baseline  Pain at eval on PRWHE 38/50    Time  3    Period  Weeks    Status  New    Target Date  02/20/19      OT SHORT TERM GOAL #2   Title  Pt to be independent in HEP to decrease pain , increase AROM  to use hand in 50% of ADL's    Baseline  very little knowledge  Time  2    Period  Weeks    Status  New    Target Date  02/06/19        OT Long Term Goals - 01/23/19 1719      OT LONG TERM GOAL #1   Title  Pt L wrist and digits AROM WNL with pain less than 3/10 to use more than 50% in ADL"s    Baseline  AROM see flowsheet- pain 2-8/10 with use and AROM    Time  4    Period  Weeks    Status  New    Target Date  02/20/19      OT LONG TERM GOAL #2   Title  Function on PRWHE with more than 20 points    Baseline  at eval function score on PRWHE 33/50    Time  4    Period  Weeks    Status  New    Target Date  02/20/19            Plan - 01/23/19 1301    Clinical Impression Statement  Pt is about 4 1/2 wks out from injury to L wrist shere she had to apply pressure thru her palm  for about hour - sprain with pain reported on radial side of hand , wrist and forearm -with some numbness into radial side of hand - but also tender this date over TFCC  - with some trigger points over dorsal forearm - pt with increase pain with digits and wrist AROM in all planes - and decrease grip and prehension strength  - all limiting her functional use of L hand    OT Occupational Profile and History  Problem Focused Assessment - Including review of records relating to presenting problem    Occupational performance deficits (Please refer to evaluation for details):  ADL's;IADL's;Work;Play;Leisure;Social Participation    Body Structure / Function / Physical Skills  ADL;ROM;UE functional use;Flexibility;Sensation;Edema;Pain;IADL    Rehab Potential  Fair    Comorbidities Affecting Occupational Performance:  May have comorbidities impacting occupational performance   fibromyalgia, CTS bilateral , cervical issues   Modification or Assistance to Complete Evaluation   No modification of tasks or assist necessary to complete eval    OT Frequency  2x / week    OT Duration  4 weeks    OT Treatment/Interventions  Self-care/ADL training;Iontophoresis;Patient/family education;Splinting;Paraffin;Fluidtherapy;Contrast Bath;Ultrasound;Manual Therapy;Passive range of motion    Plan  assess progres with doing HEP    OT Home Exercise Plan  see pt instrucition    Consulted and Agree with Plan of Care  Patient       Patient will benefit from skilled therapeutic intervention in order to improve the following deficits and impairments:   Body Structure / Function / Physical Skills: ADL, ROM, UE functional use, Flexibility, Sensation, Edema, Pain, IADL       Visit Diagnosis: Pain in left wrist - Plan: Ot plan of care cert/re-cert  Pain in left hand - Plan: Ot plan of care cert/re-cert  Stiffness of left wrist, not elsewhere classified - Plan: Ot plan of care cert/re-cert  Stiffness of left hand, not  elsewhere classified - Plan: Ot plan of care cert/re-cert  Muscle weakness (generalized) - Plan: Ot plan of care cert/re-cert  Localized edema - Plan: Ot plan of care cert/re-cert    Problem List Patient Active Problem List   Diagnosis Date Noted  . Chronic low back pain (Bilateral) w/o sciatica 08/08/2018  . Acute postoperative pain 07/23/2018  .  Abnormal MRI, cervical spine (07/10/2018) 07/17/2018  . Cervical spondylitis with radiculitis (HCC) (C6) (Bilateral) (R>L) 06/26/2018  . Chronic sacroiliac joint pain (Bilateral) (R>L) 06/26/2018  . Cervical Grade 1 (2 mm) Retrolisthesis C5 over C6 03/20/2018  . Cervical foraminal stenosis (C3-4 & C5-6) (Bilateral) 03/20/2018  . DDD (degenerative disc disease), cervical 03/20/2018  . Long term prescription benzodiazepine use 03/20/2018  . CKD (chronic kidney disease) stage 3, GFR 30-59 ml/min ([place]) 03/20/2018  . Acid reflux disease 03/20/2018  . Spondylosis without myelopathy or radiculopathy, cervical region 03/20/2018  . Chronic hip pain (Bilateral) (L>R) 03/20/2018  . Lumbar facet syndrome (Bilateral) (R>L) 03/20/2018  . Spondylosis without myelopathy or radiculopathy, lumbar region 03/20/2018  . Chronic Sacroiliac joint dysfunction (Bilateral) (R>L) 03/20/2018  . Chronic Somatic dysfunction of sacroiliac joint (Bilateral) (R>L) 03/20/2018  . Osteoarthritis of hip (Bilateral) 03/20/2018  . Cervicogenic headache (Bilateral) 03/20/2018  . Cervicalgia (Bilateral) (R>L) 03/20/2018  . Cervical facet syndrome (Bilateral) (R>L) 03/20/2018  . Chronic occipital neuralgia (Bilateral) 03/20/2018  . Chronic upper extremity pain (Fourth Area of Pain) (Bilateral) (R>L) 03/19/2018  . Ganglion cyst 02/20/2018  . Dyspepsia   . Chronic low back pain (Secondary Area of Pain) (Bilateral) (L>R) w/ sciatica (Bilateral) 01/16/2018  . Chronic lower extremity pain [facility] Area of Pain) (Bilateral) (L>R) 01/16/2018  . Chronic neck pain (Primary Area of Pain)  (Bilateral) (midline) 01/16/2018  . Chronic foot pain (Left) 01/16/2018  . Chronic pain syndrome 01/16/2018  . Pharmacologic therapy 01/16/2018  . Disorder of skeletal system 01/16/2018  . Problems influencing health status 01/16/2018  . Obesity 11/21/2017  . Anxiety 09/28/2017  . Chronic knee pain (Right) 09/28/2017  . Fatty liver 07/09/2017  . Colon polyps 07/09/2017  . Fibromyalgia 07/09/2017  . Sinusitis, chronic 07/09/2017  . Allergic rhinitis 07/09/2017  . IBS (irritable bowel syndrome) 07/09/2017  . Chronic GERD 07/09/2017  . Eyelid dermatitis, allergic/contact 07/09/2017  . Hypertension 01/12/2017  . Superficial thrombophlebitis 01/12/2017  . Rectal bleeding 12/16/2016  . Chronic sacroiliac joint pain (Right) 12/16/2016  . DDD (degenerative disc disease), lumbar 07/18/2016  . Family hx of colon cancer 07/18/2016    Rosalyn Gess OTR/L,CLT 01/23/2019, 5:26 PM  [facility] [address] [place], [address] Phone: [phone]   Fax:  [phone]  Name: Donovan Gatchel MRN: [id number] Date of Birth: 1966/05/05

## 2019-01-28 ENCOUNTER — Other Ambulatory Visit: Payer: Self-pay

## 2019-01-28 ENCOUNTER — Ambulatory Visit: Payer: PRIVATE HEALTH INSURANCE | Attending: Orthopedic Surgery | Admitting: Occupational Therapy

## 2019-01-28 DIAGNOSIS — M6281 Muscle weakness (generalized): Secondary | ICD-10-CM | POA: Diagnosis present

## 2019-01-28 DIAGNOSIS — R600 Localized edema: Secondary | ICD-10-CM | POA: Diagnosis present

## 2019-01-28 DIAGNOSIS — M25642 Stiffness of left hand, not elsewhere classified: Secondary | ICD-10-CM | POA: Diagnosis present

## 2019-01-28 DIAGNOSIS — M25632 Stiffness of left wrist, not elsewhere classified: Secondary | ICD-10-CM | POA: Insufficient documentation

## 2019-01-28 DIAGNOSIS — M25532 Pain in left wrist: Secondary | ICD-10-CM | POA: Insufficient documentation

## 2019-01-28 DIAGNOSIS — M79642 Pain in left hand: Secondary | ICD-10-CM | POA: Insufficient documentation

## 2019-01-28 NOTE — Therapy (Signed)
[facility] [address], [place], [address] Phone: [phone]   Fax:  [phone]  Occupational Therapy Treatment  Patient Details  Name: Elizabeth Bowers MRN: [id number] Date of Birth: 1966-04-23 Referring Provider (OT): Gramig   Encounter Date: 01/28/2019  OT End of Session - 01/28/19 0834    Visit Number  2    Number of Visits  8    Date for OT Re-Evaluation  02/20/19    OT Start Time  0819    OT Stop Time  0858    OT Time Calculation (min)  39 min    Activity Tolerance  Patient tolerated treatment well    Behavior During Therapy  [facility] for tasks assessed/performed       Past Medical History:  Diagnosis Date  . Fatty liver   . Fibromyalgia   . GERD (gastroesophageal reflux disease)   . Hemorrhoids   . Hot flashes   . Hypertension   . IBS (irritable bowel syndrome)   . LUQ abdominal pain 10/25/2016  . Migraines   . Miscarriage    x 2; 4 live births   . Multilevel degenerative disc disease   . Vocal cord dysfunction     Past Surgical History:  Procedure Laterality Date  . ABDOMINAL HYSTERECTOMY     2012 removed cervix h/o abnormal pap   . CHOLECYSTECTOMY     2000  . COLONOSCOPY     2018 with + polpys and GIB 2/2 polyp removal   . ELBOW DEBRIDEMENT    . ESOPHAGOGASTRODUODENOSCOPY (EGD) WITH PROPOFOL N/A 02/06/2018   Procedure: ESOPHAGOGASTRODUODENOSCOPY (EGD) WITH PROPOFOL with biopsies;  Surgeon: Lin Landsman, MD;  Location: [facility];  Service: Endoscopy;  Laterality: N/A;  . FOOT SURGERY      There were no vitals filed for this visit.  Subjective Assessment - 01/28/19 0830    Subjective   I worked every day 12 hrs shifts since last time I seen you- end of the day hurting more - but little better over all    Pertinent History  Pt injury date July 19th - had wrist splint at home from before and wore that - seen Dr Amedeo Plenty on 01/13/2019 - splint to wear at work and driving -  had xray - cont to have pain and some pins and neeldles morstly on radial side of dorsal hand and wrist    Patient Stated Goals  Want the pain and swelling better so I can use my hand like before - so I can do my job , knit , crochet, yardwork ,    Currently in Pain?  Yes    Pain Score  2     Pain Location  Arm    Pain Orientation  Left    Pain Descriptors / Indicators  Aching;Shooting                   OT Treatments/Exercises (OP) - 01/28/19 0001      Ultrasound   Ultrasound Location  dorsal forearm and wrist     Ultrasound Parameters  3.3MHZ, 20%, 0.8 intensity     Ultrasound Goals  Edema;Pain      LUE Contrast Bath   Time  9 minutes    Comments  forearm and wrist at [facility]        contrast done at [facility]     Gentle soft tissue mobs done to dorsal and radial/ulnar forearm and wrist  pain free AROM for wrist flexion ,ext, RD, UD - but change to composite flexion active stretch - pain less than 2/10  Done sup , pronation AROM - pain free 10 reps  Tendon glides AROM - pain free  10 reps  Opposition 5 reps pain free -to 2nd fold of 5th  2 x day  Wear hard wrist splint work and driving  And at home neoprene wrap or wrist splint           OT Education - 01/28/19 0834    Education Details  change flexion to composite light stretch    Person(s) Educated  Patient    Methods  Explanation;Demonstration;Tactile cues;Verbal cues;Handout    Comprehension  Verbalized understanding;Returned demonstration       OT Short Term Goals - 01/23/19 1405      OT SHORT TERM GOAL #1   Title  Pain on PRWHE improve with more than 20 points    Baseline  Pain at eval on PRWHE 38/50    Time  3    Period  Weeks    Status  New    Target Date  02/20/19      OT SHORT TERM GOAL #2   Title  Pt to be independent in HEP to decrease pain , increase AROM  to use hand in 50% of ADL's    Baseline  very little knowledge    Time  2    Period  Weeks    Status  New    Target Date   02/06/19        OT Long Term Goals - 01/23/19 1719      OT LONG TERM GOAL #1   Title  Pt L wrist and digits AROM WNL with pain less than 3/10 to use more than 50% in ADL"s    Baseline  AROM see flowsheet- pain 2-8/10 with use and AROM    Time  4    Period  Weeks    Status  New    Target Date  02/20/19      OT LONG TERM GOAL #2   Title  Function on PRWHE with more than 20 points    Baseline  at eval function score on PRWHE 33/50    Time  4    Period  Weeks    Status  New    Target Date  02/20/19            Plan - 01/28/19 0836    Clinical Impression Statement  Pt is about 5 wks out from injury to L wrist - still cont to have trigger points over dorsal forearm - but not as tender on TFCC this date -and 1st dorsal compartment  - FInkelstein about 4/10 - very tender still - having some radiation to shoulder and fingers - nerves ? and she do have cervical issues    OT Occupational Profile and History  Problem Focused Assessment - Including review of records relating to presenting problem    Occupational performance deficits (Please refer to evaluation for details):  ADL's;IADL's;Work;Play;Leisure;Social Participation    Body Structure / Function / Physical Skills  ADL;ROM;UE functional use;Flexibility;Sensation;Edema;Pain;IADL    Rehab Potential  Fair    Comorbidities Affecting Occupational Performance:  May have comorbidities impacting occupational performance    Modification or Assistance to Complete Evaluation   No modification of tasks or assist necessary to complete eval    OT Frequency  2x / week    OT Duration  4  weeks    OT Treatment/Interventions  Self-care/ADL training;Iontophoresis;Patient/family education;Splinting;Paraffin;Fluidtherapy;Contrast Bath;Ultrasound;Manual Therapy;Passive range of motion    Plan  assess progres with doing HEP    OT Home Exercise Plan  see pt instrucition    Consulted and Agree with Plan of Care  Patient       Patient will benefit from  skilled therapeutic intervention in order to improve the following deficits and impairments:   Body Structure / Function / Physical Skills: ADL, ROM, UE functional use, Flexibility, Sensation, Edema, Pain, IADL       Visit Diagnosis: Pain in left wrist  Pain in left hand  Stiffness of left wrist, not elsewhere classified  Stiffness of left hand, not elsewhere classified  Muscle weakness (generalized)  Localized edema    Problem List Patient Active Problem List   Diagnosis Date Noted  . Chronic low back pain (Bilateral) w/o sciatica 08/08/2018  . Acute postoperative pain 07/23/2018  . Abnormal MRI, cervical spine (07/10/2018) 07/17/2018  . Cervical spondylitis with radiculitis (HCC) (C6) (Bilateral) (R>L) 06/26/2018  . Chronic sacroiliac joint pain (Bilateral) (R>L) 06/26/2018  . Cervical Grade 1 (2 mm) Retrolisthesis C5 over C6 03/20/2018  . Cervical foraminal stenosis (C3-4 & C5-6) (Bilateral) 03/20/2018  . DDD (degenerative disc disease), cervical 03/20/2018  . Long term prescription benzodiazepine use 03/20/2018  . CKD (chronic kidney disease) stage 3, GFR 30-59 ml/min ([place]) 03/20/2018  . Acid reflux disease 03/20/2018  . Spondylosis without myelopathy or radiculopathy, cervical region 03/20/2018  . Chronic hip pain (Bilateral) (L>R) 03/20/2018  . Lumbar facet syndrome (Bilateral) (R>L) 03/20/2018  . Spondylosis without myelopathy or radiculopathy, lumbar region 03/20/2018  . Chronic Sacroiliac joint dysfunction (Bilateral) (R>L) 03/20/2018  . Chronic Somatic dysfunction of sacroiliac joint (Bilateral) (R>L) 03/20/2018  . Osteoarthritis of hip (Bilateral) 03/20/2018  . Cervicogenic headache (Bilateral) 03/20/2018  . Cervicalgia (Bilateral) (R>L) 03/20/2018  . Cervical facet syndrome (Bilateral) (R>L) 03/20/2018  . Chronic occipital neuralgia (Bilateral) 03/20/2018  . Chronic upper extremity pain (Fourth Area of Pain) (Bilateral) (R>L) 03/19/2018  . Ganglion cyst  02/20/2018  . Dyspepsia   . Chronic low back pain (Secondary Area of Pain) (Bilateral) (L>R) w/ sciatica (Bilateral) 01/16/2018  . Chronic lower extremity pain [facility] Area of Pain) (Bilateral) (L>R) 01/16/2018  . Chronic neck pain (Primary Area of Pain) (Bilateral) (midline) 01/16/2018  . Chronic foot pain (Left) 01/16/2018  . Chronic pain syndrome 01/16/2018  . Pharmacologic therapy 01/16/2018  . Disorder of skeletal system 01/16/2018  . Problems influencing health status 01/16/2018  . Obesity 11/21/2017  . Anxiety 09/28/2017  . Chronic knee pain (Right) 09/28/2017  . Fatty liver 07/09/2017  . Colon polyps 07/09/2017  . Fibromyalgia 07/09/2017  . Sinusitis, chronic 07/09/2017  . Allergic rhinitis 07/09/2017  . IBS (irritable bowel syndrome) 07/09/2017  . Chronic GERD 07/09/2017  . Eyelid dermatitis, allergic/contact 07/09/2017  . Hypertension 01/12/2017  . Superficial thrombophlebitis 01/12/2017  . Rectal bleeding 12/16/2016  . Chronic sacroiliac joint pain (Right) 12/16/2016  . DDD (degenerative disc disease), lumbar 07/18/2016  . Family hx of colon cancer 07/18/2016    Rosalyn Gess OTR/l,CLT 01/28/2019, 9:37 AM  [place] [address] [place], [address] Phone: [phone]   Fax:  [phone]  Name: Elizabeth Bowers MRN: [id number] Date of Birth: 1966-01-15

## 2019-01-28 NOTE — Patient Instructions (Signed)
Same HEP - but change wrist flexion , extention - to keep hand in light fist and do composite flexion stretch - still keep pull under 2/10

## 2019-01-30 ENCOUNTER — Other Ambulatory Visit: Payer: Self-pay

## 2019-01-30 ENCOUNTER — Ambulatory Visit: Payer: PRIVATE HEALTH INSURANCE | Admitting: Occupational Therapy

## 2019-01-30 DIAGNOSIS — M25632 Stiffness of left wrist, not elsewhere classified: Secondary | ICD-10-CM

## 2019-01-30 DIAGNOSIS — R600 Localized edema: Secondary | ICD-10-CM

## 2019-01-30 DIAGNOSIS — M79642 Pain in left hand: Secondary | ICD-10-CM

## 2019-01-30 DIAGNOSIS — M25532 Pain in left wrist: Secondary | ICD-10-CM | POA: Diagnosis not present

## 2019-01-30 DIAGNOSIS — M25642 Stiffness of left hand, not elsewhere classified: Secondary | ICD-10-CM

## 2019-01-30 DIAGNOSIS — M6281 Muscle weakness (generalized): Secondary | ICD-10-CM

## 2019-01-30 NOTE — Patient Instructions (Signed)
Pt to do contrast , wrist splint wearing  AROM pain free for wrist in all planes, tendon glides, opposition  10 reps

## 2019-01-30 NOTE — Therapy (Signed)
[facility] [address] [place], [address] Phone: [phone]   Fax:  [phone]  Occupational Therapy Treatment  Patient Details  Name: Elizabeth Bowers MRN: [id number] Date of Birth: Jul 04, 1965 Referring Provider (OT): Gramig   Encounter Date: 01/30/2019  OT End of Session - 01/30/19 1239    Visit Number  3    Number of Visits  8    Date for OT Re-Evaluation  02/20/19    OT Start Time  1050    OT Stop Time  1130    OT Time Calculation (min)  40 min    Activity Tolerance  Patient tolerated treatment well    Behavior During Therapy  [facility] for tasks assessed/performed       Past Medical History:  Diagnosis Date  . Fatty liver   . Fibromyalgia   . GERD (gastroesophageal reflux disease)   . Hemorrhoids   . Hot flashes   . Hypertension   . IBS (irritable bowel syndrome)   . LUQ abdominal pain 10/25/2016  . Migraines   . Miscarriage    x 2; 4 live births   . Multilevel degenerative disc disease   . Vocal cord dysfunction     Past Surgical History:  Procedure Laterality Date  . ABDOMINAL HYSTERECTOMY     2012 removed cervix h/o abnormal pap   . CHOLECYSTECTOMY     2000  . COLONOSCOPY     2018 with + polpys and GIB 2/2 polyp removal   . ELBOW DEBRIDEMENT    . ESOPHAGOGASTRODUODENOSCOPY (EGD) WITH PROPOFOL N/A 02/06/2018   Procedure: ESOPHAGOGASTRODUODENOSCOPY (EGD) WITH PROPOFOL with biopsies;  Surgeon: Lin Landsman, MD;  Location: [facility];  Service: Endoscopy;  Laterality: N/A;  . FOOT SURGERY      There were no vitals filed for this visit.  Subjective Assessment - 01/30/19 1236    Subjective   I got hurt again yesterday - had to hold another pt down yesterday while they were trying to get IV in to calm him down - so he pull on my arm so my shoulder , neck and forearm into hand worse again the pain    Pertinent History  Pt injury date July 19th - had wrist splint at home from  before and wore that - seen Dr Amedeo Plenty on 01/13/2019 - splint to wear at work and driving - had xray - cont to have pain and some pins and neeldles morstly on radial side of dorsal hand and wrist    Patient Stated Goals  Want the pain and swelling better so I can use my hand like before - so I can do my job , knit , crochet, yardwork ,    Currently in Pain?  Yes    Pain Score  5     Pain Location  Arm    Pain Orientation  Left    Pain Descriptors / Indicators  Aching    Pain Type  Acute pain         contrast done at [facility]   Able to tolerate very little  soft tissue mobs  to dorsal and radial/ulnar forearm and wrist pain free AROM for wrist flexion ,ext, RD, UD this date done by OT  - Done sup , pronation AROM - pain free 10 reps  Tendon glides AROM - pain free  10 reps  Opposition 5 reps pain free -to 2nd fold of 5th  2 x day  Wear hard wrist splint work and driving  And at home neoprene wrap or wrist splint- but if need hard one again after yesterday increase pain   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             OT Treatments/Exercises (OP) - 01/30/19 0001      Ultrasound   Ultrasound Location  dorsal and radial forearm at end of session     Ultrasound Parameters  3.3MHZ, 20 %, 0.8 intensity     Ultrasound Goals  Edema;Pain      LUE Contrast Bath   Time  9 minutes    Comments  forearm and hand              OT Education - 01/30/19 1239    Education Details  pain free AROM - splint wearing    Person(s) Educated  Patient    Methods  Explanation;Demonstration;Tactile cues;Verbal cues;Handout    Comprehension  Verbalized understanding;Returned demonstration       OT Short Term Goals - 01/23/19 1405      OT SHORT TERM GOAL #1   Title  Pain on PRWHE improve with more than 20 points    Baseline  Pain at eval on PRWHE 38/50    Time  3    Period  Weeks    Status  New    Target Date  02/20/19      OT SHORT TERM  GOAL #2   Title  Pt to be independent in HEP to decrease pain , increase AROM  to use hand in 50% of ADL's    Baseline  very little knowledge    Time  2    Period  Weeks    Status  New    Target Date  02/06/19        OT Long Term Goals - 01/23/19 1719      OT LONG TERM GOAL #1   Title  Pt L wrist and digits AROM WNL with pain less than 3/10 to use more than 50% in ADL"s    Baseline  AROM see flowsheet- pain 2-8/10 with use and AROM    Time  4    Period  Weeks    Status  New    Target Date  02/20/19      OT LONG TERM GOAL #2   Title  Function on PRWHE with more than 20 points    Baseline  at eval function score on PRWHE 33/50    Time  4    Period  Weeks    Status  New    Target Date  02/20/19            Plan - 01/30/19 1240    Clinical Impression Statement  Pt is about 5 1/2 wks out from injury to L wrist and forearm - but per pt reinjured it at work again yesterday - while holding pt's arm down and pt pull her L hand and arm - pt increase pain , tenderness in foreram to hand - increase numbness again - and also report some pain in neck , shoulder - but has history of that    OT Occupational Profile and History  Problem Focused Assessment - Including review of records relating to presenting problem    Occupational performance deficits (Please refer to evaluation for details):  ADL's;IADL's;Work;Play;Leisure;Social Participation    Body Structure / Function / Physical Skills  ADL;ROM;UE functional use;Flexibility;Sensation;Edema;Pain;IADL  Rehab Potential  Fair    Clinical Decision Making  Limited treatment options, no task modification necessary    Comorbidities Affecting Occupational Performance:  May have comorbidities impacting occupational performance    Modification or Assistance to Complete Evaluation   No modification of tasks or assist necessary to complete eval    OT Frequency  2x / week    OT Duration  4 weeks    OT Treatment/Interventions  Self-care/ADL  training;Iontophoresis;Patient/family education;Splinting;Paraffin;Fluidtherapy;Contrast Bath;Ultrasound;Manual Therapy;Passive range of motion    Plan  assess progres with doing HEP after reinjury yesterday    OT Home Exercise Plan  see pt instrucition    Consulted and Agree with Plan of Care  Patient       Patient will benefit from skilled therapeutic intervention in order to improve the following deficits and impairments:   Body Structure / Function / Physical Skills: ADL, ROM, UE functional use, Flexibility, Sensation, Edema, Pain, IADL       Visit Diagnosis: Pain in left wrist  Pain in left hand  Stiffness of left wrist, not elsewhere classified  Stiffness of left hand, not elsewhere classified  Muscle weakness (generalized)  Localized edema    Problem List Patient Active Problem List   Diagnosis Date Noted  . Chronic low back pain (Bilateral) w/o sciatica 08/08/2018  . Acute postoperative pain 07/23/2018  . Abnormal MRI, cervical spine (07/10/2018) 07/17/2018  . Cervical spondylitis with radiculitis (HCC) (C6) (Bilateral) (R>L) 06/26/2018  . Chronic sacroiliac joint pain (Bilateral) (R>L) 06/26/2018  . Cervical Grade 1 (2 mm) Retrolisthesis C5 over C6 03/20/2018  . Cervical foraminal stenosis (C3-4 & C5-6) (Bilateral) 03/20/2018  . DDD (degenerative disc disease), cervical 03/20/2018  . Long term prescription benzodiazepine use 03/20/2018  . CKD (chronic kidney disease) stage 3, GFR 30-59 ml/min ([place]) 03/20/2018  . Acid reflux disease 03/20/2018  . Spondylosis without myelopathy or radiculopathy, cervical region 03/20/2018  . Chronic hip pain (Bilateral) (L>R) 03/20/2018  . Lumbar facet syndrome (Bilateral) (R>L) 03/20/2018  . Spondylosis without myelopathy or radiculopathy, lumbar region 03/20/2018  . Chronic Sacroiliac joint dysfunction (Bilateral) (R>L) 03/20/2018  . Chronic Somatic dysfunction of sacroiliac joint (Bilateral) (R>L) 03/20/2018  . Osteoarthritis  of hip (Bilateral) 03/20/2018  . Cervicogenic headache (Bilateral) 03/20/2018  . Cervicalgia (Bilateral) (R>L) 03/20/2018  . Cervical facet syndrome (Bilateral) (R>L) 03/20/2018  . Chronic occipital neuralgia (Bilateral) 03/20/2018  . Chronic upper extremity pain (Fourth Area of Pain) (Bilateral) (R>L) 03/19/2018  . Ganglion cyst 02/20/2018  . Dyspepsia   . Chronic low back pain (Secondary Area of Pain) (Bilateral) (L>R) w/ sciatica (Bilateral) 01/16/2018  . Chronic lower extremity pain [facility] Area of Pain) (Bilateral) (L>R) 01/16/2018  . Chronic neck pain (Primary Area of Pain) (Bilateral) (midline) 01/16/2018  . Chronic foot pain (Left) 01/16/2018  . Chronic pain syndrome 01/16/2018  . Pharmacologic therapy 01/16/2018  . Disorder of skeletal system 01/16/2018  . Problems influencing health status 01/16/2018  . Obesity 11/21/2017  . Anxiety 09/28/2017  . Chronic knee pain (Right) 09/28/2017  . Fatty liver 07/09/2017  . Colon polyps 07/09/2017  . Fibromyalgia 07/09/2017  . Sinusitis, chronic 07/09/2017  . Allergic rhinitis 07/09/2017  . IBS (irritable bowel syndrome) 07/09/2017  . Chronic GERD 07/09/2017  . Eyelid dermatitis, allergic/contact 07/09/2017  . Hypertension 01/12/2017  . Superficial thrombophlebitis 01/12/2017  . Rectal bleeding 12/16/2016  . Chronic sacroiliac joint pain (Right) 12/16/2016  . DDD (degenerative disc disease), lumbar 07/18/2016  . Family hx of colon cancer 07/18/2016  Rosalyn Gess OTR/L,CLT 01/30/2019, 12:43 PM  [place] [address], [place], [address] Phone: [phone]   Fax:  [phone]  Name: Elizabeth Bowers MRN: [id number] Date of Birth: 12-07-1965

## 2019-02-04 ENCOUNTER — Ambulatory Visit: Payer: PRIVATE HEALTH INSURANCE | Attending: Orthopedic Surgery | Admitting: Occupational Therapy

## 2019-02-04 ENCOUNTER — Other Ambulatory Visit: Payer: Self-pay

## 2019-02-04 DIAGNOSIS — M25642 Stiffness of left hand, not elsewhere classified: Secondary | ICD-10-CM | POA: Diagnosis present

## 2019-02-04 DIAGNOSIS — M6281 Muscle weakness (generalized): Secondary | ICD-10-CM | POA: Insufficient documentation

## 2019-02-04 DIAGNOSIS — M25532 Pain in left wrist: Secondary | ICD-10-CM | POA: Diagnosis not present

## 2019-02-04 DIAGNOSIS — M25632 Stiffness of left wrist, not elsewhere classified: Secondary | ICD-10-CM | POA: Insufficient documentation

## 2019-02-04 DIAGNOSIS — M79642 Pain in left hand: Secondary | ICD-10-CM | POA: Insufficient documentation

## 2019-02-04 DIAGNOSIS — R600 Localized edema: Secondary | ICD-10-CM | POA: Insufficient documentation

## 2019-02-04 NOTE — Therapy (Signed)
[facility] [address] [place], [address] Phone: [phone]   Fax:  [phone]  Occupational Therapy Treatment  Patient Details  Name: Elizabeth Bowers MRN: [id number] Date of Birth: 1965/07/12 Referring Provider (OT): Gramig   Encounter Date: 02/04/2019  OT End of Session - 02/04/19 0836    Visit Number  4    Number of Visits  8    Date for OT Re-Evaluation  02/20/19    OT Start Time  0825    OT Stop Time  0856    OT Time Calculation (min)  31 min    Activity Tolerance  Patient tolerated treatment well    Behavior During Therapy  [facility] for tasks assessed/performed       Past Medical History:  Diagnosis Date  . Fatty liver   . Fibromyalgia   . GERD (gastroesophageal reflux disease)   . Hemorrhoids   . Hot flashes   . Hypertension   . IBS (irritable bowel syndrome)   . LUQ abdominal pain 10/25/2016  . Migraines   . Miscarriage    x 2; 4 live births   . Multilevel degenerative disc disease   . Vocal cord dysfunction     Past Surgical History:  Procedure Laterality Date  . ABDOMINAL HYSTERECTOMY     2012 removed cervix h/o abnormal pap   . CHOLECYSTECTOMY     2000  . COLONOSCOPY     2018 with + polpys and GIB 2/2 polyp removal   . ELBOW DEBRIDEMENT    . ESOPHAGOGASTRODUODENOSCOPY (EGD) WITH PROPOFOL N/A 02/06/2018   Procedure: ESOPHAGOGASTRODUODENOSCOPY (EGD) WITH PROPOFOL with biopsies;  Surgeon: Lin Landsman, MD;  Location: [place];  Service: Endoscopy;  Laterality: N/A;  . FOOT SURGERY      There were no vitals filed for this visit.  Subjective Assessment - 02/04/19 0826    Subjective   I worked light duty for 14 hrs - at check in and pain better- sitting here no pain - but if I start moving pain and still up into my shoulder - but so much better with light duty    Pertinent History  Pt injury date July 19th - had wrist splint at home from before and wore that - seen Dr  Amedeo Plenty on 01/13/2019 - splint to wear at work and driving - had xray - cont to have pain and some pins and neeldles morstly on radial side of dorsal hand and wrist    Patient Stated Goals  Want the pain and swelling better so I can use my hand like before - so I can do my job , knit , crochet, yardwork ,    Currently in Pain?  Yes    Pain Score  0-No pain                   OT Treatments/Exercises (OP) - 02/04/19 0001      Ultrasound   Ultrasound Location  dorsal and radial     Ultrasound Parameters  3.3MHZ, 20%, 1.0 intensity     Ultrasound Goals  Edema;Pain      LUE Contrast Bath   Time  9 minutes    Comments  forearm and hand pirior to soft tissue        contrast done at [facility]   Gentle soft tissue mobs done to dorsal and radial/ulnar forearm and wrist pain free AROM for wrist flexion ,ext, RD, UD - but  add again  composite flexion active stretch - pain less than 2/10  Done sup , pronation AROM - pain free 10 reps  Tendon glides AROM - pain free  10 reps  Opposition 5 reps pain free -to 2nd fold of 5th  2 x day  Wear hard wrist splint work and driving  And at home neoprene wrap or wrist splint       OT Education - 02/04/19 0835    Education Details  changes to ROM HEP    Person(s) Educated  Patient    Methods  Explanation;Demonstration;Tactile cues;Verbal cues;Handout    Comprehension  Verbalized understanding;Returned demonstration       OT Short Term Goals - 01/23/19 1405      OT SHORT TERM GOAL #1   Title  Pain on PRWHE improve with more than 20 points    Baseline  Pain at eval on PRWHE 38/50    Time  3    Period  Weeks    Status  New    Target Date  02/20/19      OT SHORT TERM GOAL #2   Title  Pt to be independent in HEP to decrease pain , increase AROM  to use hand in 50% of ADL's    Baseline  very little knowledge    Time  2    Period  Weeks    Status  New    Target Date  02/06/19        OT Long Term Goals - 01/23/19 1719       OT LONG TERM GOAL #1   Title  Pt L wrist and digits AROM WNL with pain less than 3/10 to use more than 50% in ADL"s    Baseline  AROM see flowsheet- pain 2-8/10 with use and AROM    Time  4    Period  Weeks    Status  New    Target Date  02/20/19      OT LONG TERM GOAL #2   Title  Function on PRWHE with more than 20 points    Baseline  at eval function score on PRWHE 33/50    Time  4    Period  Weeks    Status  New    Target Date  02/20/19            Plan - 02/04/19 0837    Clinical Impression Statement  Pt is about 6 1/2 wks out from injury to L wrist and forearm - reinjured it end of last week - but doing better today than last time - pain at rest 0/10 but still trigger points on dorsal and radial forearm - tender - but increase AROM at wrist in all planes but pain and tenderness with soft tissue and resistance - pt to see Dr Amedeo Plenty later this week - to ask if PT can do dryneedling to dorsal and radial forearm trigger points    OT Occupational Profile and History  Problem Focused Assessment - Including review of records relating to presenting problem    Occupational performance deficits (Please refer to evaluation for details):  ADL's;IADL's;Work;Play;Leisure;Social Participation    Body Structure / Function / Physical Skills  ADL;ROM;UE functional use;Flexibility;Sensation;Edema;Pain;IADL    Rehab Potential  Fair    Clinical Decision Making  Limited treatment options, no task modification necessary    Comorbidities Affecting Occupational Performance:  May have comorbidities impacting occupational performance    Modification or Assistance to Complete Evaluation   No  modification of tasks or assist necessary to complete eval    OT Frequency  2x / week    OT Duration  4 weeks    OT Treatment/Interventions  Self-care/ADL training;Iontophoresis;Patient/family education;Splinting;Paraffin;Fluidtherapy;Contrast Bath;Ultrasound;Manual Therapy;Passive range of motion    Plan  progress  and upgrade HEP to tolerance    OT Home Exercise Plan  see pt instrucition    Consulted and Agree with Plan of Care  Patient       Patient will benefit from skilled therapeutic intervention in order to improve the following deficits and impairments:   Body Structure / Function / Physical Skills: ADL, ROM, UE functional use, Flexibility, Sensation, Edema, Pain, IADL       Visit Diagnosis: Pain in left wrist  Pain in left hand  Stiffness of left wrist, not elsewhere classified  Stiffness of left hand, not elsewhere classified  Muscle weakness (generalized)  Localized edema    Problem List Patient Active Problem List   Diagnosis Date Noted  . Chronic low back pain (Bilateral) w/o sciatica 08/08/2018  . Acute postoperative pain 07/23/2018  . Abnormal MRI, cervical spine (07/10/2018) 07/17/2018  . Cervical spondylitis with radiculitis (HCC) (C6) (Bilateral) (R>L) 06/26/2018  . Chronic sacroiliac joint pain (Bilateral) (R>L) 06/26/2018  . Cervical Grade 1 (2 mm) Retrolisthesis C5 over C6 03/20/2018  . Cervical foraminal stenosis (C3-4 & C5-6) (Bilateral) 03/20/2018  . DDD (degenerative disc disease), cervical 03/20/2018  . Long term prescription benzodiazepine use 03/20/2018  . CKD (chronic kidney disease) stage 3, GFR 30-59 ml/min ([place]) 03/20/2018  . Acid reflux disease 03/20/2018  . Spondylosis without myelopathy or radiculopathy, cervical region 03/20/2018  . Chronic hip pain (Bilateral) (L>R) 03/20/2018  . Lumbar facet syndrome (Bilateral) (R>L) 03/20/2018  . Spondylosis without myelopathy or radiculopathy, lumbar region 03/20/2018  . Chronic Sacroiliac joint dysfunction (Bilateral) (R>L) 03/20/2018  . Chronic Somatic dysfunction of sacroiliac joint (Bilateral) (R>L) 03/20/2018  . Osteoarthritis of hip (Bilateral) 03/20/2018  . Cervicogenic headache (Bilateral) 03/20/2018  . Cervicalgia (Bilateral) (R>L) 03/20/2018  . Cervical facet syndrome (Bilateral) (R>L) 03/20/2018   . Chronic occipital neuralgia (Bilateral) 03/20/2018  . Chronic upper extremity pain (Fourth Area of Pain) (Bilateral) (R>L) 03/19/2018  . Ganglion cyst 02/20/2018  . Dyspepsia   . Chronic low back pain (Secondary Area of Pain) (Bilateral) (L>R) w/ sciatica (Bilateral) 01/16/2018  . Chronic lower extremity pain [facility] Area of Pain) (Bilateral) (L>R) 01/16/2018  . Chronic neck pain (Primary Area of Pain) (Bilateral) (midline) 01/16/2018  . Chronic foot pain (Left) 01/16/2018  . Chronic pain syndrome 01/16/2018  . Pharmacologic therapy 01/16/2018  . Disorder of skeletal system 01/16/2018  . Problems influencing health status 01/16/2018  . Obesity 11/21/2017  . Anxiety 09/28/2017  . Chronic knee pain (Right) 09/28/2017  . Fatty liver 07/09/2017  . Colon polyps 07/09/2017  . Fibromyalgia 07/09/2017  . Sinusitis, chronic 07/09/2017  . Allergic rhinitis 07/09/2017  . IBS (irritable bowel syndrome) 07/09/2017  . Chronic GERD 07/09/2017  . Eyelid dermatitis, allergic/contact 07/09/2017  . Hypertension 01/12/2017  . Superficial thrombophlebitis 01/12/2017  . Rectal bleeding 12/16/2016  . Chronic sacroiliac joint pain (Right) 12/16/2016  . DDD (degenerative disc disease), lumbar 07/18/2016  . Family hx of colon cancer 07/18/2016    Rosalyn Gess OTR/L,CLT 02/04/2019, 9:50 AM  [place] [address], [place], [address] Phone: [phone]   Fax:  [phone]  Name: Floyd Parilla MRN: [id number] Date of Birth: 1965-06-06

## 2019-02-04 NOTE — Patient Instructions (Signed)
Same HEP - but can do composite flexion of wrist and digits gentle PROM

## 2019-02-06 ENCOUNTER — Other Ambulatory Visit: Payer: Self-pay | Admitting: Orthopedic Surgery

## 2019-02-06 DIAGNOSIS — M654 Radial styloid tenosynovitis [de Quervain]: Secondary | ICD-10-CM

## 2019-02-07 ENCOUNTER — Other Ambulatory Visit: Payer: Self-pay

## 2019-02-07 ENCOUNTER — Ambulatory Visit: Payer: PRIVATE HEALTH INSURANCE | Admitting: Occupational Therapy

## 2019-02-07 DIAGNOSIS — M25632 Stiffness of left wrist, not elsewhere classified: Secondary | ICD-10-CM

## 2019-02-07 DIAGNOSIS — M79642 Pain in left hand: Secondary | ICD-10-CM

## 2019-02-07 DIAGNOSIS — M6281 Muscle weakness (generalized): Secondary | ICD-10-CM

## 2019-02-07 DIAGNOSIS — M25642 Stiffness of left hand, not elsewhere classified: Secondary | ICD-10-CM

## 2019-02-07 DIAGNOSIS — M25532 Pain in left wrist: Secondary | ICD-10-CM

## 2019-02-07 DIAGNOSIS — R600 Localized edema: Secondary | ICD-10-CM

## 2019-02-07 NOTE — Therapy (Signed)
[facility] [address] [place], [address] Phone: [phone]   Fax:  [phone]  Occupational Therapy Treatment  Patient Details  Name: Elizabeth Bowers MRN: [id number] Date of Birth: 02-Jul-1965 Referring Provider (OT): Gramig   Encounter Date: 02/07/2019  OT End of Session - 02/07/19 1012    Visit Number  5    Number of Visits  8    Date for OT Re-Evaluation  02/20/19    OT Start Time  0916    OT Stop Time  0956    OT Time Calculation (min)  40 min    Activity Tolerance  Patient tolerated treatment well    Behavior During Therapy  [facility] for tasks assessed/performed       Past Medical History:  Diagnosis Date  . Fatty liver   . Fibromyalgia   . GERD (gastroesophageal reflux disease)   . Hemorrhoids   . Hot flashes   . Hypertension   . IBS (irritable bowel syndrome)   . LUQ abdominal pain 10/25/2016  . Migraines   . Miscarriage    x 2; 4 live births   . Multilevel degenerative disc disease   . Vocal cord dysfunction     Past Surgical History:  Procedure Laterality Date  . ABDOMINAL HYSTERECTOMY     2012 removed cervix h/o abnormal pap   . CHOLECYSTECTOMY     2000  . COLONOSCOPY     2018 with + polpys and GIB 2/2 polyp removal   . ELBOW DEBRIDEMENT    . ESOPHAGOGASTRODUODENOSCOPY (EGD) WITH PROPOFOL N/A 02/06/2018   Procedure: ESOPHAGOGASTRODUODENOSCOPY (EGD) WITH PROPOFOL with biopsies;  Surgeon: Lin Landsman, MD;  Location: [place];  Service: Endoscopy;  Laterality: N/A;  . FOOT SURGERY      There were no vitals filed for this visit.  Subjective Assessment - 02/07/19 1009    Subjective   Seen Dr Amedeo Plenty - okayed dryneeldling - and switch me to wrist widget splint - having MRI I think the 14th    Pertinent History  Pt injury date July 19th - had wrist splint at home from before and wore that - seen Dr Amedeo Plenty on 01/13/2019 - splint to wear at work and driving - had xray -  cont to have pain and some pins and neeldles morstly on radial side of dorsal hand and wrist    Patient Stated Goals  Want the pain and swelling better so I can use my hand like before - so I can do my job , knit , crochet, yardwork ,    Currently in Pain?  Yes    Pain Score  2     Pain Location  Arm    Pain Orientation  Left    Pain Descriptors / Indicators  Aching;Tender    Pain Type  Acute pain    Pain Onset  More than a month ago         [facility] OT Assessment - 02/07/19 0001      AROM   Left Wrist Extension  65 Degrees   pull volar wrist    Left Wrist Flexion  84 Degrees   pull dorsal wrist   Left Wrist Radial Deviation  20 Degrees   pull radial and ulnar wrist    Left Wrist Ulnar Deviation  30 Degrees      Strength   Right Hand Grip (lbs)  65    Right Hand Lateral Pinch  18  lbs    Right Hand 3 Point Pinch  18.5 lbs    Left Hand Grip (lbs)  58    Left Hand Lateral Pinch  17 lbs    Left Hand 3 Point Pinch  11 lbs         AROM and grip strength assess - see flowsheet- great progress but pain still issues and trigger points with spasm       OT Treatments/Exercises (OP) - 02/07/19 0001      LUE Contrast Bath   Time  6 minutes    Comments  forearm and wrist        soft tissue mobs - assess this date over proximal flexors and extensors - spasm and trigger point- Dr Amedeo Plenty okayed dry needling- it is in his note -  [facility] - done on flexor and extensor mass each on needling - good twitches - pt did bruise little on flexor - but per pt she bruise easily   AROM for wrist in all planes 10 reps  fisting and oppositon   thumb PA and RA 10 reps  and ice at end    Web[employer] soft tissue mobs by OT prior to thumb PA and RA  - tight         OT Education - 02/07/19 1011    Education Details  cont with AROM pain free range    Person(s) Educated  Patient    Methods  Explanation;Demonstration;Tactile cues;Verbal cues;Handout    Comprehension  Verbalized  understanding;Returned demonstration       OT Short Term Goals - 01/23/19 1405      OT SHORT TERM GOAL #1   Title  Pain on PRWHE improve with more than 20 points    Baseline  Pain at eval on PRWHE 38/50    Time  3    Period  Weeks    Status  New    Target Date  02/20/19      OT SHORT TERM GOAL #2   Title  Pt to be independent in HEP to decrease pain , increase AROM  to use hand in 50% of ADL's    Baseline  very little knowledge    Time  2    Period  Weeks    Status  New    Target Date  02/06/19        OT Long Term Goals - 01/23/19 1719      OT LONG TERM GOAL #1   Title  Pt L wrist and digits AROM WNL with pain less than 3/10 to use more than 50% in ADL"s    Baseline  AROM see flowsheet- pain 2-8/10 with use and AROM    Time  4    Period  Weeks    Status  New    Target Date  02/20/19      OT LONG TERM GOAL #2   Title  Function on PRWHE with more than 20 points    Baseline  at eval function score on PRWHE 33/50    Time  4    Period  Weeks    Status  New    Target Date  02/20/19            Plan - 02/07/19 1012    Clinical Impression Statement  Pt is about 7 wks out form injury to L wrist and forearm - seen Dr Amedeo Plenty yesterday - switch pt to wearing wrist widget and order MRI for 14th per pt -  okay to do dry needling - done this date in forearm flexor and extensor mass  - will assess results - AROM pain free and ice done afterwards    OT Occupational Profile and History  Problem Focused Assessment - Including review of records relating to presenting problem    Occupational performance deficits (Please refer to evaluation for details):  ADL's;IADL's;Work;Play;Leisure;Social Participation    Body Structure / Function / Physical Skills  ADL;ROM;UE functional use;Flexibility;Sensation;Edema;Pain;IADL    Rehab Potential  Fair    Clinical Decision Making  Limited treatment options, no task modification necessary    Comorbidities Affecting Occupational Performance:  May  have comorbidities impacting occupational performance    Modification or Assistance to Complete Evaluation   No modification of tasks or assist necessary to complete eval    OT Frequency  2x / week    OT Duration  4 weeks    OT Treatment/Interventions  Self-care/ADL training;Iontophoresis;Patient/family education;Splinting;Paraffin;Fluidtherapy;Contrast Bath;Ultrasound;Manual Therapy;Passive range of motion    Plan  progress and upgrade HEP to tolerance    OT Home Exercise Plan  see pt instrucition    Consulted and Agree with Plan of Care  Patient       Patient will benefit from skilled therapeutic intervention in order to improve the following deficits and impairments:   Body Structure / Function / Physical Skills: ADL, ROM, UE functional use, Flexibility, Sensation, Edema, Pain, IADL       Visit Diagnosis: Pain in left wrist  Pain in left hand  Stiffness of left wrist, not elsewhere classified  Stiffness of left hand, not elsewhere classified  Muscle weakness (generalized)  Localized edema    Problem List Patient Active Problem List   Diagnosis Date Noted  . Chronic low back pain (Bilateral) w/o sciatica 08/08/2018  . Acute postoperative pain 07/23/2018  . Abnormal MRI, cervical spine (07/10/2018) 07/17/2018  . Cervical spondylitis with radiculitis (HCC) (C6) (Bilateral) (R>L) 06/26/2018  . Chronic sacroiliac joint pain (Bilateral) (R>L) 06/26/2018  . Cervical Grade 1 (2 mm) Retrolisthesis C5 over C6 03/20/2018  . Cervical foraminal stenosis (C3-4 & C5-6) (Bilateral) 03/20/2018  . DDD (degenerative disc disease), cervical 03/20/2018  . Long term prescription benzodiazepine use 03/20/2018  . CKD (chronic kidney disease) stage 3, GFR 30-59 ml/min ([place]) 03/20/2018  . Acid reflux disease 03/20/2018  . Spondylosis without myelopathy or radiculopathy, cervical region 03/20/2018  . Chronic hip pain (Bilateral) (L>R) 03/20/2018  . Lumbar facet syndrome (Bilateral) (R>L)  03/20/2018  . Spondylosis without myelopathy or radiculopathy, lumbar region 03/20/2018  . Chronic Sacroiliac joint dysfunction (Bilateral) (R>L) 03/20/2018  . Chronic Somatic dysfunction of sacroiliac joint (Bilateral) (R>L) 03/20/2018  . Osteoarthritis of hip (Bilateral) 03/20/2018  . Cervicogenic headache (Bilateral) 03/20/2018  . Cervicalgia (Bilateral) (R>L) 03/20/2018  . Cervical facet syndrome (Bilateral) (R>L) 03/20/2018  . Chronic occipital neuralgia (Bilateral) 03/20/2018  . Chronic upper extremity pain (Fourth Area of Pain) (Bilateral) (R>L) 03/19/2018  . Ganglion cyst 02/20/2018  . Dyspepsia   . Chronic low back pain (Secondary Area of Pain) (Bilateral) (L>R) w/ sciatica (Bilateral) 01/16/2018  . Chronic lower extremity pain [facility] Area of Pain) (Bilateral) (L>R) 01/16/2018  . Chronic neck pain (Primary Area of Pain) (Bilateral) (midline) 01/16/2018  . Chronic foot pain (Left) 01/16/2018  . Chronic pain syndrome 01/16/2018  . Pharmacologic therapy 01/16/2018  . Disorder of skeletal system 01/16/2018  . Problems influencing health status 01/16/2018  . Obesity 11/21/2017  . Anxiety 09/28/2017  . Chronic knee pain (Right) 09/28/2017  . Fatty  liver 07/09/2017  . Colon polyps 07/09/2017  . Fibromyalgia 07/09/2017  . Sinusitis, chronic 07/09/2017  . Allergic rhinitis 07/09/2017  . IBS (irritable bowel syndrome) 07/09/2017  . Chronic GERD 07/09/2017  . Eyelid dermatitis, allergic/contact 07/09/2017  . Hypertension 01/12/2017  . Superficial thrombophlebitis 01/12/2017  . Rectal bleeding 12/16/2016  . Chronic sacroiliac joint pain (Right) 12/16/2016  . DDD (degenerative disc disease), lumbar 07/18/2016  . Family hx of colon cancer 07/18/2016    Rosalyn Gess OTR/L,CLT 02/07/2019, 10:14 AM  [facility] [address] [place], [address] Phone: [phone]   Fax:  [phone]  Name: Khadajah Eachus MRN: [id number] Date of Birth: 1965/10/18

## 2019-02-07 NOTE — Patient Instructions (Signed)
Same

## 2019-02-11 ENCOUNTER — Other Ambulatory Visit: Payer: Self-pay

## 2019-02-11 ENCOUNTER — Ambulatory Visit: Payer: PRIVATE HEALTH INSURANCE | Admitting: Occupational Therapy

## 2019-02-11 DIAGNOSIS — M25632 Stiffness of left wrist, not elsewhere classified: Secondary | ICD-10-CM

## 2019-02-11 DIAGNOSIS — M25532 Pain in left wrist: Secondary | ICD-10-CM | POA: Diagnosis not present

## 2019-02-11 DIAGNOSIS — M6281 Muscle weakness (generalized): Secondary | ICD-10-CM

## 2019-02-11 DIAGNOSIS — M79642 Pain in left hand: Secondary | ICD-10-CM

## 2019-02-11 DIAGNOSIS — M25642 Stiffness of left hand, not elsewhere classified: Secondary | ICD-10-CM

## 2019-02-11 DIAGNOSIS — R600 Localized edema: Secondary | ICD-10-CM

## 2019-02-11 NOTE — Patient Instructions (Signed)
Same

## 2019-02-11 NOTE — Therapy (Signed)
[facility] [address], [place], [address] Phone: [phone]   Fax:  [phone]  Occupational Therapy Treatment  Patient Details  Name: Elizabeth Bowers MRN: [id number] Date of Birth: 1966/01/27 Referring Provider (OT): Gramig   Encounter Date: 02/11/2019  OT End of Session - 02/11/19 1624    Visit Number  6    Number of Visits  8    Date for OT Re-Evaluation  02/20/19    OT Start Time  1545    OT Stop Time  1620    OT Time Calculation (min)  35 min    Activity Tolerance  Patient tolerated treatment well    Behavior During Therapy  [facility] for tasks assessed/performed       Past Medical History:  Diagnosis Date  . Fatty liver   . Fibromyalgia   . GERD (gastroesophageal reflux disease)   . Hemorrhoids   . Hot flashes   . Hypertension   . IBS (irritable bowel syndrome)   . LUQ abdominal pain 10/25/2016  . Migraines   . Miscarriage    x 2; 4 live births   . Multilevel degenerative disc disease   . Vocal cord dysfunction     Past Surgical History:  Procedure Laterality Date  . ABDOMINAL HYSTERECTOMY     2012 removed cervix h/o abnormal pap   . CHOLECYSTECTOMY     2000  . COLONOSCOPY     2018 with + polpys and GIB 2/2 polyp removal   . ELBOW DEBRIDEMENT    . ESOPHAGOGASTRODUODENOSCOPY (EGD) WITH PROPOFOL N/A 02/06/2018   Procedure: ESOPHAGOGASTRODUODENOSCOPY (EGD) WITH PROPOFOL with biopsies;  Surgeon: Lin Landsman, MD;  Location: [facility];  Service: Endoscopy;  Laterality: N/A;  . FOOT SURGERY      There were no vitals filed for this visit.  Subjective Assessment - 02/11/19 1622    Subjective   Pain about 2/10 - more in my shoulder today -and my hand and whole arm bothers me if I use it a lot the day    Pertinent History  Pt injury date July 19th - had wrist splint at home from before and wore that - seen Dr Amedeo Plenty on 01/13/2019 - splint to wear at work and driving - had xray -  cont to have pain and some pins and neeldles morstly on radial side of dorsal hand and wrist    Patient Stated Goals  Want the pain and swelling better so I can use my hand like before - so I can do my job , knit , crochet, yardwork ,    Currently in Pain?  Yes    Pain Score  2     Pain Location  Arm    Pain Orientation  Left    Pain Descriptors / Indicators  Aching;Tender    Pain Type  Acute pain    Pain Onset  More than a month ago         [facility] OT Assessment - 02/11/19 0001      AROM   Left Wrist Extension  70 Degrees    Left Wrist Flexion  90 Degrees      Strength   Right Hand Grip (lbs)  65    Right Hand Lateral Pinch  18 lbs    Right Hand 3 Point Pinch  18.5 lbs    Left Hand Grip (lbs)  60    Left Hand Lateral Pinch  17 lbs  Left Hand 3 Point Pinch  16 lbs       Assess AROM and grip /prehension - cont to improve - pain 2/10 coming in and more in shoulder then hand and forearm  Cont to be tight in forearm -and trigger points PT dry needling to extensor mass and flexor - 2 needles  Tolerate well - do bruise some - had it done last Friday with great results  After needling done AROM for wrist in all planes  And tendon glides and opposition   Graston tool nr 2 and 4 sweeping over bicep and deltoid-           OT Treatments/Exercises (OP) - 02/11/19 0001      Cryotherapy   Number Minutes Cryotherapy  5 Minutes    Cryotherapy Location  Forearm;Hand;Wrist    Type of Cryotherapy  Ice pack       done at end of session       OT Education - 02/11/19 1624    Education Details  cont with AROM pain free range    Person(s) Educated  Patient    Methods  Explanation;Demonstration;Tactile cues;Verbal cues;Handout    Comprehension  Verbalized understanding;Returned demonstration       OT Short Term Goals - 01/23/19 1405      OT SHORT TERM GOAL #1   Title  Pain on PRWHE improve with more than 20 points    Baseline  Pain at eval on PRWHE 38/50    Time  3     Period  Weeks    Status  New    Target Date  02/20/19      OT SHORT TERM GOAL #2   Title  Pt to be independent in HEP to decrease pain , increase AROM  to use hand in 50% of ADL's    Baseline  very little knowledge    Time  2    Period  Weeks    Status  New    Target Date  02/06/19        OT Long Term Goals - 01/23/19 1719      OT LONG TERM GOAL #1   Title  Pt L wrist and digits AROM WNL with pain less than 3/10 to use more than 50% in ADL"s    Baseline  AROM see flowsheet- pain 2-8/10 with use and AROM    Time  4    Period  Weeks    Status  New    Target Date  02/20/19      OT LONG TERM GOAL #2   Title  Function on PRWHE with more than 20 points    Baseline  at eval function score on PRWHE 33/50    Time  4    Period  Weeks    Status  New    Target Date  02/20/19            Plan - 02/11/19 1624    Clinical Impression Statement  Pt is 7 1/2 wks out form injury to L wrist and forearm -pt do complain at times more of her shoulder and pain starting in shoulder - scedule for MRI for wrist on Monday- pain decrease , AROM in wrist increase and grip /prehension strength    OT Occupational Profile and History  Problem Focused Assessment - Including review of records relating to presenting problem    Occupational performance deficits (Please refer to evaluation for details):  ADL's;IADL's;Work;Play;Leisure;Social Participation    Body Structure /  Function / Physical Skills  ADL;ROM;UE functional use;Flexibility;Sensation;Edema;Pain;IADL    Rehab Potential  Fair    Clinical Decision Making  Limited treatment options, no task modification necessary    Comorbidities Affecting Occupational Performance:  May have comorbidities impacting occupational performance    Modification or Assistance to Complete Evaluation   No modification of tasks or assist necessary to complete eval    OT Frequency  2x / week    OT Duration  4 weeks    OT Treatment/Interventions  Self-care/ADL  training;Iontophoresis;Patient/family education;Splinting;Paraffin;Fluidtherapy;Contrast Bath;Ultrasound;Manual Therapy;Passive range of motion    Plan  progress and upgrade HEP to tolerance    OT Home Exercise Plan  see pt instrucition    Consulted and Agree with Plan of Care  Patient       Patient will benefit from skilled therapeutic intervention in order to improve the following deficits and impairments:   Body Structure / Function / Physical Skills: ADL, ROM, UE functional use, Flexibility, Sensation, Edema, Pain, IADL       Visit Diagnosis: Pain in left hand  Stiffness of left wrist, not elsewhere classified  Muscle weakness (generalized)  Localized edema  Stiffness of left hand, not elsewhere classified  Pain in left wrist    Problem List Patient Active Problem List   Diagnosis Date Noted  . Chronic low back pain (Bilateral) w/o sciatica 08/08/2018  . Acute postoperative pain 07/23/2018  . Abnormal MRI, cervical spine (07/10/2018) 07/17/2018  . Cervical spondylitis with radiculitis (HCC) (C6) (Bilateral) (R>L) 06/26/2018  . Chronic sacroiliac joint pain (Bilateral) (R>L) 06/26/2018  . Cervical Grade 1 (2 mm) Retrolisthesis C5 over C6 03/20/2018  . Cervical foraminal stenosis (C3-4 & C5-6) (Bilateral) 03/20/2018  . DDD (degenerative disc disease), cervical 03/20/2018  . Long term prescription benzodiazepine use 03/20/2018  . CKD (chronic kidney disease) stage 3, GFR 30-59 ml/min ([place]) 03/20/2018  . Acid reflux disease 03/20/2018  . Spondylosis without myelopathy or radiculopathy, cervical region 03/20/2018  . Chronic hip pain (Bilateral) (L>R) 03/20/2018  . Lumbar facet syndrome (Bilateral) (R>L) 03/20/2018  . Spondylosis without myelopathy or radiculopathy, lumbar region 03/20/2018  . Chronic Sacroiliac joint dysfunction (Bilateral) (R>L) 03/20/2018  . Chronic Somatic dysfunction of sacroiliac joint (Bilateral) (R>L) 03/20/2018  . Osteoarthritis of hip (Bilateral)  03/20/2018  . Cervicogenic headache (Bilateral) 03/20/2018  . Cervicalgia (Bilateral) (R>L) 03/20/2018  . Cervical facet syndrome (Bilateral) (R>L) 03/20/2018  . Chronic occipital neuralgia (Bilateral) 03/20/2018  . Chronic upper extremity pain (Fourth Area of Pain) (Bilateral) (R>L) 03/19/2018  . Ganglion cyst 02/20/2018  . Dyspepsia   . Chronic low back pain (Secondary Area of Pain) (Bilateral) (L>R) w/ sciatica (Bilateral) 01/16/2018  . Chronic lower extremity pain [facility] Area of Pain) (Bilateral) (L>R) 01/16/2018  . Chronic neck pain (Primary Area of Pain) (Bilateral) (midline) 01/16/2018  . Chronic foot pain (Left) 01/16/2018  . Chronic pain syndrome 01/16/2018  . Pharmacologic therapy 01/16/2018  . Disorder of skeletal system 01/16/2018  . Problems influencing health status 01/16/2018  . Obesity 11/21/2017  . Anxiety 09/28/2017  . Chronic knee pain (Right) 09/28/2017  . Fatty liver 07/09/2017  . Colon polyps 07/09/2017  . Fibromyalgia 07/09/2017  . Sinusitis, chronic 07/09/2017  . Allergic rhinitis 07/09/2017  . IBS (irritable bowel syndrome) 07/09/2017  . Chronic GERD 07/09/2017  . Eyelid dermatitis, allergic/contact 07/09/2017  . Hypertension 01/12/2017  . Superficial thrombophlebitis 01/12/2017  . Rectal bleeding 12/16/2016  . Chronic sacroiliac joint pain (Right) 12/16/2016  . DDD (degenerative disc disease), lumbar 07/18/2016  .  Family hx of colon cancer 07/18/2016    Rosalyn Gess OTR/L,CLT 02/11/2019, 4:26 PM  [facility] [address] [place], [address] Phone: [phone]   Fax:  [phone]  Name: Elizabeth Bowers MRN: [id number] Date of Birth: 1965/08/03

## 2019-02-13 ENCOUNTER — Other Ambulatory Visit: Payer: Self-pay

## 2019-02-13 ENCOUNTER — Ambulatory Visit: Payer: PRIVATE HEALTH INSURANCE | Attending: Orthopedic Surgery | Admitting: Occupational Therapy

## 2019-02-13 DIAGNOSIS — M25632 Stiffness of left wrist, not elsewhere classified: Secondary | ICD-10-CM | POA: Insufficient documentation

## 2019-02-13 DIAGNOSIS — M6281 Muscle weakness (generalized): Secondary | ICD-10-CM | POA: Insufficient documentation

## 2019-02-13 DIAGNOSIS — R600 Localized edema: Secondary | ICD-10-CM | POA: Insufficient documentation

## 2019-02-13 DIAGNOSIS — M25642 Stiffness of left hand, not elsewhere classified: Secondary | ICD-10-CM | POA: Insufficient documentation

## 2019-02-13 DIAGNOSIS — M25532 Pain in left wrist: Secondary | ICD-10-CM | POA: Diagnosis present

## 2019-02-13 DIAGNOSIS — M79642 Pain in left hand: Secondary | ICD-10-CM | POA: Insufficient documentation

## 2019-02-13 NOTE — Therapy (Signed)
[facility] [address] [place], [address] Phone: [phone]   Fax:  [phone]  Occupational Therapy Treatment  Patient Details  Name: Elizabeth Bowers MRN: [id number] Date of Birth: March 22, 1966 Referring Provider (OT): Gramig   Encounter Date: 02/13/2019  OT End of Session - 02/13/19 0832    Visit Number  7    Number of Visits  8    Date for OT Re-Evaluation  02/20/19    OT Start Time  0818    OT Stop Time  0858    OT Time Calculation (min)  40 min    Activity Tolerance  Patient tolerated treatment well    Behavior During Therapy  [facility] for tasks assessed/performed       Past Medical History:  Diagnosis Date  . Fatty liver   . Fibromyalgia   . GERD (gastroesophageal reflux disease)   . Hemorrhoids   . Hot flashes   . Hypertension   . IBS (irritable bowel syndrome)   . LUQ abdominal pain 10/25/2016  . Migraines   . Miscarriage    x 2; 4 live births   . Multilevel degenerative disc disease   . Vocal cord dysfunction     Past Surgical History:  Procedure Laterality Date  . ABDOMINAL HYSTERECTOMY     2012 removed cervix h/o abnormal pap   . CHOLECYSTECTOMY     2000  . COLONOSCOPY     2018 with + polpys and GIB 2/2 polyp removal   . ELBOW DEBRIDEMENT    . ESOPHAGOGASTRODUODENOSCOPY (EGD) WITH PROPOFOL N/A 02/06/2018   Procedure: ESOPHAGOGASTRODUODENOSCOPY (EGD) WITH PROPOFOL with biopsies;  Surgeon: Lin Landsman, MD;  Location: [facility];  Service: Endoscopy;  Laterality: N/A;  . FOOT SURGERY      There were no vitals filed for this visit.  Subjective Assessment - 02/13/19 0830    Subjective   Good coming in - pain coming in about 0/10 - did work 14 hrs but light duty at check in    Pertinent History  Pt injury date July 19th - had wrist splint at home from before and wore that - seen Dr Amedeo Plenty on 01/13/2019 - splint to wear at work and driving - had xray - cont to have pain  and some pins and neeldles morstly on radial side of dorsal hand and wrist    Patient Stated Goals  Want the pain and swelling better so I can use my hand like before - so I can do my job , knit , crochet, yardwork ,    Currently in Pain?  Yes    Pain Score  8     Pain Location  Arm    Pain Orientation  Left    Pain Descriptors / Indicators  Aching;Tender    Pain Type  Acute pain    Pain Onset  More than a month ago    Aggravating Factors   supination , elbow extention       AROM for wrist in all planes - WNL  Pain with resistance to supination with grip  Elbow extention decrease from guarding since injury - tender over biceps      Cont to be tight in forearm -and trigger points PT dry needling to forarm flexor - 2 needles  And biceps  Tolerate well - do bruise some - had it done 2 times in past  With great results  After needling done AROM for wrist  in all planes and elbow flexion ,ext  And tendon glides and opposition   Graston tool nr 2 sweeping over dorsal forearm   with ice at end          OT Treatments/Exercises (OP) - 02/13/19 0001      Cryotherapy   Number Minutes Cryotherapy  5 Minutes    Cryotherapy Location  --   biceps , volar forearm end    Type of Cryotherapy  Ice pack      Ultrasound   Ultrasound Location  dorsal forearm     Ultrasound Parameters  3.3MHZ 20 %; 1.0 intensity     Ultrasound Goals  Edema;Pain             OT Education - 02/13/19 [phone]    Education Details  progress and change to HEP    Person(s) Educated  Patient    Methods  Explanation;Demonstration;Tactile cues;Verbal cues;Handout    Comprehension  Verbalized understanding;Returned demonstration       OT Short Term Goals - 01/23/19 1405      OT SHORT TERM GOAL #1   Title  Pain on PRWHE improve with more than 20 points    Baseline  Pain at eval on PRWHE 38/50    Time  3    Period  Weeks    Status  New    Target Date  02/20/19      OT SHORT TERM GOAL #2   Title   Pt to be independent in HEP to decrease pain , increase AROM  to use hand in 50% of ADL's    Baseline  very little knowledge    Time  2    Period  Weeks    Status  New    Target Date  02/06/19        OT Long Term Goals - 01/23/19 1719      OT LONG TERM GOAL #1   Title  Pt L wrist and digits AROM WNL with pain less than 3/10 to use more than 50% in ADL"s    Baseline  AROM see flowsheet- pain 2-8/10 with use and AROM    Time  4    Period  Weeks    Status  New    Target Date  02/20/19      OT LONG TERM GOAL #2   Title  Function on PRWHE with more than 20 points    Baseline  at eval function score on PRWHE 33/50    Time  4    Period  Weeks    Status  New    Target Date  02/20/19            Plan - 02/13/19 [id number]    Clinical Impression Statement  PT is 8 wks out form injiury to L wrist and forearm - pt comes in pain free - but decrease extention of elbow and pain with grip and supination - done some dryneelding by Wes to flexor mass on forearm and biceps today - pt to add extention of elbow to HEP AROM -    OT Occupational Profile and History  Problem Focused Assessment - Including review of records relating to presenting problem    Occupational performance deficits (Please refer to evaluation for details):  ADL's;IADL's;Work;Play;Leisure;Social Participation    Body Structure / Function / Physical Skills  ADL;ROM;UE functional use;Flexibility;Sensation;Edema;Pain;IADL    Rehab Potential  Fair    Clinical Decision Making  Limited treatment options, no task modification necessary  Comorbidities Affecting Occupational Performance:  May have comorbidities impacting occupational performance    Modification or Assistance to Complete Evaluation   No modification of tasks or assist necessary to complete eval    OT Frequency  2x / week    OT Duration  4 weeks    OT Treatment/Interventions  Self-care/ADL training;Iontophoresis;Patient/family  education;Splinting;Paraffin;Fluidtherapy;Contrast Bath;Ultrasound;Manual Therapy;Passive range of motion    Plan  progress and upgrade HEP to tolerance- ask more visit - will be 8th visit    OT Home Exercise Plan  see pt instrucition    Consulted and Agree with Plan of Care  Patient       Patient will benefit from skilled therapeutic intervention in order to improve the following deficits and impairments:   Body Structure / Function / Physical Skills: ADL, ROM, UE functional use, Flexibility, Sensation, Edema, Pain, IADL       Visit Diagnosis: Pain in left hand  Stiffness of left wrist, not elsewhere classified  Muscle weakness (generalized)  Localized edema  Stiffness of left hand, not elsewhere classified  Pain in left wrist    Problem List Patient Active Problem List   Diagnosis Date Noted  . Chronic low back pain (Bilateral) w/o sciatica 08/08/2018  . Acute postoperative pain 07/23/2018  . Abnormal MRI, cervical spine (07/10/2018) 07/17/2018  . Cervical spondylitis with radiculitis (HCC) (C6) (Bilateral) (R>L) 06/26/2018  . Chronic sacroiliac joint pain (Bilateral) (R>L) 06/26/2018  . Cervical Grade 1 (2 mm) Retrolisthesis C5 over C6 03/20/2018  . Cervical foraminal stenosis (C3-4 & C5-6) (Bilateral) 03/20/2018  . DDD (degenerative disc disease), cervical 03/20/2018  . Long term prescription benzodiazepine use 03/20/2018  . CKD (chronic kidney disease) stage 3, GFR 30-59 ml/min ([place]) 03/20/2018  . Acid reflux disease 03/20/2018  . Spondylosis without myelopathy or radiculopathy, cervical region 03/20/2018  . Chronic hip pain (Bilateral) (L>R) 03/20/2018  . Lumbar facet syndrome (Bilateral) (R>L) 03/20/2018  . Spondylosis without myelopathy or radiculopathy, lumbar region 03/20/2018  . Chronic Sacroiliac joint dysfunction (Bilateral) (R>L) 03/20/2018  . Chronic Somatic dysfunction of sacroiliac joint (Bilateral) (R>L) 03/20/2018  . Osteoarthritis of hip (Bilateral)  03/20/2018  . Cervicogenic headache (Bilateral) 03/20/2018  . Cervicalgia (Bilateral) (R>L) 03/20/2018  . Cervical facet syndrome (Bilateral) (R>L) 03/20/2018  . Chronic occipital neuralgia (Bilateral) 03/20/2018  . Chronic upper extremity pain (Fourth Area of Pain) (Bilateral) (R>L) 03/19/2018  . Ganglion cyst 02/20/2018  . Dyspepsia   . Chronic low back pain (Secondary Area of Pain) (Bilateral) (L>R) w/ sciatica (Bilateral) 01/16/2018  . Chronic lower extremity pain [facility] Area of Pain) (Bilateral) (L>R) 01/16/2018  . Chronic neck pain (Primary Area of Pain) (Bilateral) (midline) 01/16/2018  . Chronic foot pain (Left) 01/16/2018  . Chronic pain syndrome 01/16/2018  . Pharmacologic therapy 01/16/2018  . Disorder of skeletal system 01/16/2018  . Problems influencing health status 01/16/2018  . Obesity 11/21/2017  . Anxiety 09/28/2017  . Chronic knee pain (Right) 09/28/2017  . Fatty liver 07/09/2017  . Colon polyps 07/09/2017  . Fibromyalgia 07/09/2017  . Sinusitis, chronic 07/09/2017  . Allergic rhinitis 07/09/2017  . IBS (irritable bowel syndrome) 07/09/2017  . Chronic GERD 07/09/2017  . Eyelid dermatitis, allergic/contact 07/09/2017  . Hypertension 01/12/2017  . Superficial thrombophlebitis 01/12/2017  . Rectal bleeding 12/16/2016  . Chronic sacroiliac joint pain (Right) 12/16/2016  . DDD (degenerative disc disease), lumbar 07/18/2016  . Family hx of colon cancer 07/18/2016    Rosalyn Gess OTR/L,CLT 02/13/2019, 9:47 AM  [facility] [address], [place], [address] Phone: [phone]   Fax:  [phone]  Name: Elizabeth Bowers MRN: [id number] Date of Birth: Jan 06, 1966

## 2019-02-13 NOTE — Patient Instructions (Signed)
AROM for elbow extention 10 reps add to wrist AROM HEP  And composite nerve glide - out to side - 5 reps  3 x day

## 2019-02-17 ENCOUNTER — Other Ambulatory Visit: Payer: Self-pay

## 2019-02-17 ENCOUNTER — Ambulatory Visit
Admission: RE | Admit: 2019-02-17 | Discharge: 2019-02-17 | Disposition: A | Discharge status: Home / Self Care | Payer: 59 | Source: Ambulatory Visit | Attending: Orthopedic Surgery | Admitting: Orthopedic Surgery

## 2019-02-17 ENCOUNTER — Ambulatory Visit: Payer: PRIVATE HEALTH INSURANCE | Admitting: Occupational Therapy

## 2019-02-17 DIAGNOSIS — M654 Radial styloid tenosynovitis [de Quervain]: Secondary | ICD-10-CM | POA: Insufficient documentation

## 2019-02-17 DIAGNOSIS — M6281 Muscle weakness (generalized): Secondary | ICD-10-CM

## 2019-02-17 DIAGNOSIS — R600 Localized edema: Secondary | ICD-10-CM

## 2019-02-17 DIAGNOSIS — M25632 Stiffness of left wrist, not elsewhere classified: Secondary | ICD-10-CM

## 2019-02-17 DIAGNOSIS — M25532 Pain in left wrist: Secondary | ICD-10-CM

## 2019-02-17 DIAGNOSIS — M79642 Pain in left hand: Secondary | ICD-10-CM

## 2019-02-17 DIAGNOSIS — M25642 Stiffness of left hand, not elsewhere classified: Secondary | ICD-10-CM

## 2019-02-17 NOTE — Patient Instructions (Signed)
Add composite extention stretch nerve glide - out to side  And radial N glide add to HEP  5 reps

## 2019-02-17 NOTE — Therapy (Signed)
[facility] [address], [place], [address] Phone: [phone]   Fax:  [phone]  Occupational Therapy Treatment  Patient Details  Name: Elizabeth Bowers MRN: [id number] Date of Birth: [birth year] Referring Provider (OT): Gramig   Encounter Date: 02/17/2019  OT End of Session - 02/17/19 1427    Visit Number  8    Number of Visits  17    Date for OT Re-Evaluation  03/20/19    OT Start Time  [id number]    OT Stop Time  1357    OT Time Calculation (min)  40 min    Activity Tolerance  Patient tolerated treatment well    Behavior During Therapy  [facility] for tasks assessed/performed       Past Medical History:  Diagnosis Date  . Fatty liver   . Fibromyalgia   . GERD (gastroesophageal reflux disease)   . Hemorrhoids   . Hot flashes   . Hypertension   . IBS (irritable bowel syndrome)   . LUQ abdominal pain 10/25/2016  . Migraines   . Miscarriage    x 2; 4 live births   . Multilevel degenerative disc disease   . Vocal cord dysfunction     Past Surgical History:  Procedure Laterality Date  . ABDOMINAL HYSTERECTOMY     2012 removed cervix h/o abnormal pap   . CHOLECYSTECTOMY     2000  . COLONOSCOPY     2018 with + polpys and GIB 2/2 polyp removal   . ELBOW DEBRIDEMENT    . ESOPHAGOGASTRODUODENOSCOPY (EGD) WITH PROPOFOL N/A 02/06/2018   Procedure: ESOPHAGOGASTRODUODENOSCOPY (EGD) WITH PROPOFOL with biopsies;  Surgeon: Lin Landsman, MD;  Location: [place] [facility];  Service: Endoscopy;  Laterality: N/A;  . FOOT SURGERY      There were no vitals filed for this visit.  Subjective Assessment - 02/17/19 1332    Subjective   Tired but pain coming along - increase to about 4-5/10 this weekend using it -MRI tonight - pain mostly at shoulder and did had this am some numbness over top of hand radial 4 digits    Pertinent History  Pt injury date July 19th - had wrist splint at home from before and wore that -  seen Dr Amedeo Plenty on 01/13/2019 - splint to wear at work and driving - had xray - cont to have pain and some pins and neeldles morstly on radial side of dorsal hand and wrist    Patient Stated Goals  Want the pain and swelling better so I can use my hand like before - so I can do my job , knit , crochet, yardwork ,    Currently in Pain?  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 shift yesterday - but report pain better- Coming in no pain -had some pain over weekend 4-5/10 at the worse and more in upper arm and shoulder  Did has some numbness this am waking up - over  radial N         OT Treatments/Exercises (OP) - 02/17/19 0001      Moist Heat Therapy   Number Minutes Moist Heat  --   prior at elbow , upper traps    Moist Heat Location  --   prior to soft tissue     Cryotherapy   Number Minutes Cryotherapy  5 Minutes    Cryotherapy Location  --   elbow   Type of Cryotherapy  Ice pack     Graston tool nr 2 done over upper traps , deltoid - sweeping and brushing   PT ( [place]) done some dryneedling on Biceps and brachioradialis   - 2 sticks each - with great results  Trigger points improving over forearm flexors -and more proximal now over radial wrist   composite extention stretch nerve glide - out to side done on wall- but not to push on wall to strain extensors - slight pull should be on volar forearm and elbow 5 reps  Composite N glide - 5 reps to cont with  And radial N glide add to HEP  And review  5 reps         OT Education - 02/17/19 1348    Education Details  nerve glides added    Person(s) Educated  Patient    Methods  Explanation;Demonstration;Tactile cues;Verbal cues;Handout     Comprehension  Verbalized understanding;Returned demonstration       OT Short Term Goals - 02/17/19 1431      OT SHORT TERM GOAL #1   Title  Pain on PRWHE improve with more than 20 points    Baseline  Pain at eval on PRWHE 38/50 improve greatly - pain this date 0/10 coming in and most over weekend 4-5/10    Time  3    Period  Weeks    Status  On-going    Target Date  03/10/19      OT SHORT TERM GOAL #2   Title  Pt to be independent in HEP to decrease pain , increase AROM  to use hand in 50% of ADL's    Status  Achieved        OT Long Term Goals - 02/17/19 1431      OT LONG TERM GOAL #1   Title  Pt L wrist and digits AROM WNL with pain less than 3/10 to use more than 50% in ADL"s    Baseline  AROM see flowsheet- pain 2-8/10 with use and AROM - NOW AROM WNL -pain 4-5/10 at the most with AROM - grip/prehension improve    Time  4    Period  Weeks    Status  On-going    Target Date  03/17/19      OT LONG TERM GOAL #2   Title  Function on PRWHE with more than 20 points    Baseline  at eval function score on PRWHE 33/50- assess next time    Time  4    Period  Weeks    Status  On-going    Target Date  03/17/19            Plan - 02/17/19 1428    Clinical Impression Statement  Pt is about 8 1/2 wks out from injury to L wrist and forearm - spasm and trigger points improving greatly in forearm flexors and extensors - done dryneedling on biceps and brachioradialis - trigger points on radial forearm and deltiod , biceps - add some  nerve glides this date    Occupational performance deficits (Please refer to evaluation for details):  ADL's;IADL's;Work;Play;Leisure;Social Participation    Body Structure / Function / Physical Skills  ADL;ROM;UE functional use;Flexibility;Sensation;Edema;Pain;IADL    Rehab Potential  Fair    Clinical Decision Making  Limited treatment options, no task modification necessary    Comorbidities Affecting Occupational Performance:  May have  comorbidities impacting occupational performance    Modification or Assistance to Complete Evaluation   No modification of tasks or assist necessary to complete eval    OT Frequency  2x / week    OT Duration  4 weeks    OT Treatment/Interventions  Self-care/ADL training;Iontophoresis;Patient/family education;Splinting;Paraffin;Fluidtherapy;Contrast Bath;Ultrasound;Manual Therapy;Passive range of motion    Plan  progress and soft tissue / dryneedling on upper arm    OT Home Exercise Plan  see pt instrucition    Consulted and Agree with Plan of Care  Patient       Patient will benefit from skilled therapeutic intervention in order to improve the following deficits and impairments:   Body Structure / Function / Physical Skills: ADL, ROM, UE functional use, Flexibility, Sensation, Edema, Pain, IADL       Visit Diagnosis: Pain in left hand - Plan: Ot plan of care cert/re-cert  Stiffness of left wrist, not elsewhere classified - Plan: Ot plan of care cert/re-cert  Muscle weakness (generalized) - Plan: Ot plan of care cert/re-cert  Localized edema - Plan: Ot plan of care cert/re-cert  Stiffness of left hand, not elsewhere classified - Plan: Ot plan of care cert/re-cert  Pain in left wrist - Plan: Ot plan of care cert/re-cert    Problem List Patient Active Problem List   Diagnosis Date Noted  . Chronic low back pain (Bilateral) w/o sciatica 08/08/2018  . Acute postoperative pain 07/23/2018  . Abnormal MRI, cervical spine (07/10/2018) 07/17/2018  . Cervical spondylitis with radiculitis (HCC) (C6) (Bilateral) (R>L) 06/26/2018  . Chronic sacroiliac joint pain (Bilateral) (R>L) 06/26/2018  . Cervical Grade 1 (2 mm) Retrolisthesis C5 over C6 03/20/2018  . Cervical foraminal stenosis (C3-4 & C5-6) (Bilateral) 03/20/2018  . DDD (degenerative disc disease), cervical 03/20/2018  . Long term prescription benzodiazepine use 03/20/2018  . CKD (chronic kidney disease) stage 3, GFR 30-59 ml/min  ([place]) 03/20/2018  . Acid reflux disease 03/20/2018  . Spondylosis without myelopathy or radiculopathy, cervical region 03/20/2018  . Chronic hip pain (Bilateral) (L>R) 03/20/2018  . Lumbar facet syndrome (Bilateral) (R>L) 03/20/2018  . Spondylosis without myelopathy or radiculopathy, lumbar region 03/20/2018  . Chronic Sacroiliac joint dysfunction (Bilateral) (R>L) 03/20/2018  . Chronic Somatic dysfunction of sacroiliac joint (Bilateral) (R>L) 03/20/2018  . Osteoarthritis of hip (Bilateral) 03/20/2018  . Cervicogenic headache (Bilateral) 03/20/2018  . Cervicalgia (Bilateral) (R>L) 03/20/2018  . Cervical facet syndrome (Bilateral) (R>L) 03/20/2018  . Chronic occipital neuralgia (Bilateral) 03/20/2018  . Chronic upper extremity pain (Fourth Area of Pain) (Bilateral) (R>L) 03/19/2018  . Ganglion cyst 02/20/2018  . Dyspepsia   . Chronic low back pain (Secondary Area of Pain) (Bilateral) (L>R) w/ sciatica (Bilateral) 01/16/2018  . Chronic lower extremity pain [facility] Area of Pain) (Bilateral) (L>R) 01/16/2018  . Chronic neck pain (Primary Area of Pain) (Bilateral) (midline) 01/16/2018  . Chronic foot pain (Left) 01/16/2018  . Chronic pain syndrome 01/16/2018  . Pharmacologic therapy 01/16/2018  . Disorder of skeletal system 01/16/2018  . Problems influencing health status 01/16/2018  . Obesity 11/21/2017  . Anxiety 09/28/2017  . Chronic knee pain (Right) 09/28/2017  . Fatty  liver 07/09/2017  . Colon polyps 07/09/2017  . Fibromyalgia 07/09/2017  . Sinusitis, chronic 07/09/2017  . Allergic rhinitis 07/09/2017  . IBS (irritable bowel syndrome) 07/09/2017  . Chronic GERD 07/09/2017  . Eyelid dermatitis, allergic/contact 07/09/2017  . Hypertension 01/12/2017  . Superficial thrombophlebitis 01/12/2017  . Rectal bleeding 12/16/2016  . Chronic sacroiliac joint pain (Right) 12/16/2016  . DDD (degenerative disc disease), lumbar 07/18/2016  . Family hx of colon cancer 07/18/2016     Rosalyn Gess OTR/L,CLT 02/17/2019, 3:48 PM  [facility] [address] [place], [address] Phone: [phone]   Fax:  [phone]  Name: Jaymie Mkrtchyan MRN: [id number] Date of Birth: Oct 28, 1965

## 2019-02-19 MED FILL — DULOXETINE HCL 60 MG CPEP: 60 | 90 days supply | Qty: 90 | Fill #1

## 2019-02-20 ENCOUNTER — Ambulatory Visit: Payer: PRIVATE HEALTH INSURANCE | Admitting: Occupational Therapy

## 2019-02-20 ENCOUNTER — Other Ambulatory Visit: Payer: Self-pay

## 2019-02-20 DIAGNOSIS — M79642 Pain in left hand: Secondary | ICD-10-CM | POA: Diagnosis not present

## 2019-02-20 DIAGNOSIS — M25642 Stiffness of left hand, not elsewhere classified: Secondary | ICD-10-CM

## 2019-02-20 DIAGNOSIS — M25632 Stiffness of left wrist, not elsewhere classified: Secondary | ICD-10-CM

## 2019-02-20 DIAGNOSIS — R600 Localized edema: Secondary | ICD-10-CM

## 2019-02-20 DIAGNOSIS — M25532 Pain in left wrist: Secondary | ICD-10-CM

## 2019-02-20 DIAGNOSIS — M6281 Muscle weakness (generalized): Secondary | ICD-10-CM

## 2019-02-20 NOTE — Therapy (Signed)
[facility] [address], [place], [address] Phone: [phone]   Fax:  [phone]  Occupational Therapy Treatment  Patient Details  Name: Elizabeth Bowers MRN: [id number] Date of Birth: 10-19-1965 Referring Provider (OT): Gramig   Encounter Date: 02/20/2019  OT End of Session - 02/20/19 1111    Visit Number  9    Number of Visits  17    Date for OT Re-Evaluation  03/20/19    OT Start Time  1031    OT Stop Time  1106    OT Time Calculation (min)  35 min    Activity Tolerance  Patient tolerated treatment well    Behavior During Therapy  [facility] for tasks assessed/performed       Past Medical History:  Diagnosis Date  . Fatty liver   . Fibromyalgia   . GERD (gastroesophageal reflux disease)   . Hemorrhoids   . Hot flashes   . Hypertension   . IBS (irritable bowel syndrome)   . LUQ abdominal pain 10/25/2016  . Migraines   . Miscarriage    x 2; 4 live births   . Multilevel degenerative disc disease   . Vocal cord dysfunction     Past Surgical History:  Procedure Laterality Date  . ABDOMINAL HYSTERECTOMY     2012 removed cervix h/o abnormal pap   . CHOLECYSTECTOMY     2000  . COLONOSCOPY     2018 with + polpys and GIB 2/2 polyp removal   . ELBOW DEBRIDEMENT    . ESOPHAGOGASTRODUODENOSCOPY (EGD) WITH PROPOFOL N/A 02/06/2018   Procedure: ESOPHAGOGASTRODUODENOSCOPY (EGD) WITH PROPOFOL with biopsies;  Surgeon: Lin Landsman, MD;  Location: [place];  Service: Endoscopy;  Laterality: N/A;  . FOOT SURGERY      There were no vitals filed for this visit.  Subjective Assessment - 02/20/19 1041    Subjective   It is getting better- it is so much better than it was - my arm just feeling little sore today - but it is the rainy weather I think    Pertinent History  Pt injury date July 19th - had wrist splint at home from before and wore that - seen Dr Amedeo Plenty on 01/13/2019 - splint to wear at  work and driving - had xray - cont to have pain and some pins and neeldles morstly on radial side of dorsal hand and wrist    Patient Stated Goals  Want the pain and swelling better so I can use my hand like before - so I can do my job , knit , crochet, yardwork ,    Currently in Pain?  Yes    Pain Score  2     Pain Location  Arm    Pain Orientation  Left    Pain Descriptors / Indicators  Aching       Assess nerve glides and elbow extention - less tightness in biceps and brachialis Still tight and tender over brachioradialis   Graston tool nr 2 done over upper traps , posterior deltoid  And forearm flexors- sweeping and brushing   PT ( [place]) done some dryneedling on mid Deltoid  and brachioradialis   - 2 sticks each - with great results and twiching Trigger points improving over forearm flexors -and more proximal now over radial wrist   composite extention stretch nerve glide - out to side done on wall- but not to push on wall to strain extensors -  slight pull should be on volar forearm and elbow 5 reps  Composite N glide - 5 reps to cont with - done great and less pull  And radial N glide review with less of pull 5 reps                   OT Education - 02/20/19 1111    Education Details  cont with massage , stretches and nerve glides    Person(s) Educated  Patient    Methods  Explanation;Demonstration;Tactile cues;Verbal cues;Handout    Comprehension  Verbalized understanding;Returned demonstration       OT Short Term Goals - 02/17/19 1431      OT SHORT TERM GOAL #1   Title  Pain on PRWHE improve with more than 20 points    Baseline  Pain at eval on PRWHE 38/50 improve greatly - pain this date 0/10 coming in and most over weekend 4-5/10    Time  3    Period  Weeks    Status  On-going    Target Date  03/10/19      OT SHORT TERM GOAL #2   Title  Pt to be independent in HEP to decrease pain , increase AROM  to use hand in 50% of ADL's    Status  Achieved         OT Long Term Goals - 02/17/19 1431      OT LONG TERM GOAL #1   Title  Pt L wrist and digits AROM WNL with pain less than 3/10 to use more than 50% in ADL"s    Baseline  AROM see flowsheet- pain 2-8/10 with use and AROM - NOW AROM WNL -pain 4-5/10 at the most with AROM - grip/prehension improve    Time  4    Period  Weeks    Status  On-going    Target Date  03/17/19      OT LONG TERM GOAL #2   Title  Function on PRWHE with more than 20 points    Baseline  at eval function score on PRWHE 33/50- assess next time    Time  4    Period  Weeks    Status  On-going    Target Date  03/17/19            Plan - 02/20/19 1112    Clinical Impression Statement  Pt is about 9 wks out from injury to L wrist and forearm - spasms and trigger points improving greatly as is pain - focusing now more on upper arm , bicep and brachioradialis - increase elbow extention and less discomfort with radial N glide    OT Occupational Profile and History  Problem Focused Assessment - Including review of records relating to presenting problem    Occupational performance deficits (Please refer to evaluation for details):  ADL's;IADL's;Work;Play;Leisure;Social Participation    Body Structure / Function / Physical Skills  ADL;ROM;UE functional use;Flexibility;Sensation;Edema;Pain;IADL    Rehab Potential  Fair    Clinical Decision Making  Limited treatment options, no task modification necessary    Comorbidities Affecting Occupational Performance:  May have comorbidities impacting occupational performance    Modification or Assistance to Complete Evaluation   No modification of tasks or assist necessary to complete eval    OT Frequency  2x / week    OT Duration  4 weeks    OT Treatment/Interventions  Self-care/ADL training;Iontophoresis;Patient/family education;Splinting;Paraffin;Fluidtherapy;Contrast Bath;Ultrasound;Manual Therapy;Passive range of motion    Plan  progress and soft  tissue / dryneedling on  upper arm    OT Home Exercise Plan  see pt instrucition    Consulted and Agree with Plan of Care  Patient       Patient will benefit from skilled therapeutic intervention in order to improve the following deficits and impairments:   Body Structure / Function / Physical Skills: ADL, ROM, UE functional use, Flexibility, Sensation, Edema, Pain, IADL       Visit Diagnosis: Stiffness of left wrist, not elsewhere classified  Pain in left hand  Muscle weakness (generalized)  Localized edema  Stiffness of left hand, not elsewhere classified  Pain in left wrist    Problem List Patient Active Problem List   Diagnosis Date Noted  . Chronic low back pain (Bilateral) w/o sciatica 08/08/2018  . Acute postoperative pain 07/23/2018  . Abnormal MRI, cervical spine (07/10/2018) 07/17/2018  . Cervical spondylitis with radiculitis (HCC) (C6) (Bilateral) (R>L) 06/26/2018  . Chronic sacroiliac joint pain (Bilateral) (R>L) 06/26/2018  . Cervical Grade 1 (2 mm) Retrolisthesis C5 over C6 03/20/2018  . Cervical foraminal stenosis (C3-4 & C5-6) (Bilateral) 03/20/2018  . DDD (degenerative disc disease), cervical 03/20/2018  . Long term prescription benzodiazepine use 03/20/2018  . CKD (chronic kidney disease) stage 3, GFR 30-59 ml/min ([place]) 03/20/2018  . Acid reflux disease 03/20/2018  . Spondylosis without myelopathy or radiculopathy, cervical region 03/20/2018  . Chronic hip pain (Bilateral) (L>R) 03/20/2018  . Lumbar facet syndrome (Bilateral) (R>L) 03/20/2018  . Spondylosis without myelopathy or radiculopathy, lumbar region 03/20/2018  . Chronic Sacroiliac joint dysfunction (Bilateral) (R>L) 03/20/2018  . Chronic Somatic dysfunction of sacroiliac joint (Bilateral) (R>L) 03/20/2018  . Osteoarthritis of hip (Bilateral) 03/20/2018  . Cervicogenic headache (Bilateral) 03/20/2018  . Cervicalgia (Bilateral) (R>L) 03/20/2018  . Cervical facet syndrome (Bilateral) (R>L) 03/20/2018  . Chronic  occipital neuralgia (Bilateral) 03/20/2018  . Chronic upper extremity pain (Fourth Area of Pain) (Bilateral) (R>L) 03/19/2018  . Ganglion cyst 02/20/2018  . Dyspepsia   . Chronic low back pain (Secondary Area of Pain) (Bilateral) (L>R) w/ sciatica (Bilateral) 01/16/2018  . Chronic lower extremity pain [facility] Area of Pain) (Bilateral) (L>R) 01/16/2018  . Chronic neck pain (Primary Area of Pain) (Bilateral) (midline) 01/16/2018  . Chronic foot pain (Left) 01/16/2018  . Chronic pain syndrome 01/16/2018  . Pharmacologic therapy 01/16/2018  . Disorder of skeletal system 01/16/2018  . Problems influencing health status 01/16/2018  . Obesity 11/21/2017  . Anxiety 09/28/2017  . Chronic knee pain (Right) 09/28/2017  . Fatty liver 07/09/2017  . Colon polyps 07/09/2017  . Fibromyalgia 07/09/2017  . Sinusitis, chronic 07/09/2017  . Allergic rhinitis 07/09/2017  . IBS (irritable bowel syndrome) 07/09/2017  . Chronic GERD 07/09/2017  . Eyelid dermatitis, allergic/contact 07/09/2017  . Hypertension 01/12/2017  . Superficial thrombophlebitis 01/12/2017  . Rectal bleeding 12/16/2016  . Chronic sacroiliac joint pain (Right) 12/16/2016  . DDD (degenerative disc disease), lumbar 07/18/2016  . Family hx of colon cancer 07/18/2016    Rosalyn Gess OTR/L,CLT 02/20/2019, 11:14 AM  [facility] [address], [place], [address] Phone: [phone]   Fax:  [phone]  Name: Elizabeth Bowers MRN: [id number] Date of Birth: [birth year]

## 2019-02-20 NOTE — Patient Instructions (Signed)
same

## 2019-02-24 ENCOUNTER — Ambulatory Visit: Payer: 59 | Attending: Orthopedic Surgery | Admitting: Occupational Therapy

## 2019-02-24 ENCOUNTER — Other Ambulatory Visit: Payer: Self-pay

## 2019-02-24 DIAGNOSIS — M79642 Pain in left hand: Secondary | ICD-10-CM | POA: Diagnosis not present

## 2019-02-24 DIAGNOSIS — M25632 Stiffness of left wrist, not elsewhere classified: Secondary | ICD-10-CM

## 2019-02-24 DIAGNOSIS — M25532 Pain in left wrist: Secondary | ICD-10-CM | POA: Diagnosis not present

## 2019-02-24 DIAGNOSIS — M6281 Muscle weakness (generalized): Secondary | ICD-10-CM | POA: Diagnosis not present

## 2019-02-24 DIAGNOSIS — R600 Localized edema: Secondary | ICD-10-CM | POA: Diagnosis not present

## 2019-02-24 DIAGNOSIS — M25642 Stiffness of left hand, not elsewhere classified: Secondary | ICD-10-CM | POA: Diagnosis not present

## 2019-02-24 NOTE — Patient Instructions (Signed)
same

## 2019-02-24 NOTE — Therapy (Signed)
[facility] [address], [place], [address] Phone: [phone]   Fax:  [phone]  Occupational Therapy Treatment  Patient Details  Name: Elizabeth Bowers MRN: [id number] Date of Birth: 1966-02-04 Referring Provider (OT): Gramig   Encounter Date: 02/24/2019  OT End of Session - 02/24/19 1510    Visit Number  10    Number of Visits  17    Date for OT Re-Evaluation  03/20/19    OT Start Time  1445    OT Stop Time  1522    OT Time Calculation (min)  37 min    Activity Tolerance  Patient tolerated treatment well    Behavior During Therapy  [facility] for tasks assessed/performed       Past Medical History:  Diagnosis Date  . Fatty liver   . Fibromyalgia   . GERD (gastroesophageal reflux disease)   . Hemorrhoids   . Hot flashes   . Hypertension   . IBS (irritable bowel syndrome)   . LUQ abdominal pain 10/25/2016  . Migraines   . Miscarriage    x 2; 4 live births   . Multilevel degenerative disc disease   . Vocal cord dysfunction     Past Surgical History:  Procedure Laterality Date  . ABDOMINAL HYSTERECTOMY     2012 removed cervix h/o abnormal pap   . CHOLECYSTECTOMY     2000  . COLONOSCOPY     2018 with + polpys and GIB 2/2 polyp removal   . ELBOW DEBRIDEMENT    . ESOPHAGOGASTRODUODENOSCOPY (EGD) WITH PROPOFOL N/A 02/06/2018   Procedure: ESOPHAGOGASTRODUODENOSCOPY (EGD) WITH PROPOFOL with biopsies;  Surgeon: Lin Landsman, MD;  Location: [place];  Service: Endoscopy;  Laterality: N/A;  . FOOT SURGERY      There were no vitals filed for this visit.  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  Yes    Pain Score  5     Pain Location  --   volar forearm   Pain Orientation  Left    Pain Descriptors / Indicators  Aching         [facility] OT Assessment - 02/24/19 0001      Strength   Right Hand Grip (lbs)  65    Right Hand Lateral Pinch  17 lbs    Right Hand 3 Point Pinch  19 lbs    Left Hand Grip (lbs)  62    Left Hand Lateral Pinch  17 lbs    Left Hand 3 Point Pinch  18 lbs       Pt made great progress in grip and prehension - see flow sheet  pain improving distally - arrive this date with  some pain volar forearm - 4/10   but did come from [employer] per pt    Assess nerve glides and elbow extention - less tightness in biceps and brachialis Still tight and tender over brachioradialis- but volar forearm in flexors tight and some pain this date      PT ( Wes) done some dryneedling on mid Deltoid  and forearm flexors- 2 sticks each - with great results and twiching Trigger points improving over forearm  -and more proximal now over radial wrist  composite extention stretch nerve glide - out to sidedone on wall- but not to push on wall to strain extensors - slight pull should be on volar forearm and elbow 5 reps  Composite N glide - 5 reps to cont with- done great and less pull  And radial N glide reviewwith less of pull 5 reps     OT Treatments/Exercises (OP) - 02/24/19 0001      Cryotherapy   Number Minutes Cryotherapy  5 Minutes    Cryotherapy Location  --   forearm and elbow   Type of Cryotherapy  Ice pack             OT Education - 02/24/19 1510    Education Details  cont nerve glides, stretches    Person(s) Educated  Patient    Methods   Explanation;Demonstration;Tactile cues;Verbal cues;Handout    Comprehension  Verbalized understanding;Returned demonstration       OT Short Term Goals - 02/17/19 1431      OT SHORT TERM GOAL #1   Title  Pain on PRWHE improve with more than 20 points    Baseline  Pain at eval on PRWHE 38/50 improve greatly - pain this date 0/10 coming in and most over weekend 4-5/10    Time  3    Period  Weeks    Status  On-going    Target Date  03/10/19      OT SHORT TERM GOAL #2   Title  Pt to be independent in HEP to decrease pain , increase AROM  to use hand in 50% of ADL's    Status  Achieved        OT Long Term Goals - 02/17/19 1431      OT LONG TERM GOAL #1   Title  Pt L wrist and digits AROM WNL with pain less than 3/10 to use more than 50% in ADL"s    Baseline  AROM see flowsheet- pain 2-8/10 with use and AROM - NOW AROM WNL -pain 4-5/10 at the most with AROM - grip/prehension improve    Time  4    Period  Weeks    Status  On-going    Target Date  03/17/19      OT LONG TERM GOAL #2   Title  Function on PRWHE with more than 20 points    Baseline  at eval function score on PRWHE 33/50- assess next time    Time  4    Period  Weeks    Status  On-going    Target Date  03/17/19            Plan - 02/24/19 1512    Clinical Impression Statement  Pt is about 9 1/2 wks out from injury - pt progressing very well in grip and prehension - and AROM WNL - pain improving - had this date some in volar forearm -and neck - progressing very well    OT Occupational Profile and History  Problem Focused Assessment - Including review of records relating to presenting problem    Occupational performance deficits (Please refer to evaluation for details):  ADL's;IADL's;Work;Play;Leisure;Social Participation    Body Structure / Function / Physical Skills  ADL;ROM;UE functional use;Flexibility;Sensation;Edema;Pain;IADL    Rehab Potential  Fair    Clinical Decision Making  Limited treatment options, no  task modification necessary    Comorbidities Affecting Occupational Performance:  May have comorbidities impacting occupational performance    Modification or Assistance to Complete Evaluation   No modification of tasks or assist necessary to complete eval    OT Frequency  2x / week    OT Duration  4 weeks    OT Treatment/Interventions  Self-care/ADL training;Iontophoresis;Patient/family education;Splinting;Paraffin;Fluidtherapy;Contrast Bath;Ultrasound;Manual Therapy;Passive range of motion    Plan  progress and soft tissue / dryneedling on upper arm    OT Home Exercise Plan  see pt instrucition    Consulted and Agree with Plan of Care  Patient       Patient will benefit from skilled therapeutic intervention in order to improve the following deficits and impairments:   Body Structure / Function / Physical Skills: ADL, ROM, UE functional use, Flexibility, Sensation, Edema, Pain, IADL       Visit Diagnosis: Pain in left hand  Muscle weakness (generalized)  Stiffness of left wrist, not elsewhere classified  Localized edema  Stiffness of left hand, not elsewhere classified  Pain in left wrist    Problem List Patient Active Problem List   Diagnosis Date Noted  . Chronic low back pain (Bilateral) w/o sciatica 08/08/2018  . Acute postoperative pain 07/23/2018  . Abnormal MRI, cervical spine (07/10/2018) 07/17/2018  . Cervical spondylitis with radiculitis (HCC) (C6) (Bilateral) (R>L) 06/26/2018  . Chronic sacroiliac joint pain (Bilateral) (R>L) 06/26/2018  . Cervical Grade 1 (2 mm) Retrolisthesis C5 over C6 03/20/2018  . Cervical foraminal stenosis (C3-4 & C5-6) (Bilateral) 03/20/2018  . DDD (degenerative disc disease), cervical 03/20/2018  . Long term prescription benzodiazepine use 03/20/2018  . CKD (chronic kidney disease) stage 3, GFR 30-59 ml/min ([place]) 03/20/2018  . Acid reflux disease 03/20/2018  . Spondylosis without myelopathy or radiculopathy, cervical region 03/20/2018   . Chronic hip pain (Bilateral) (L>R) 03/20/2018  . Lumbar facet syndrome (Bilateral) (R>L) 03/20/2018  . Spondylosis without myelopathy or radiculopathy, lumbar region 03/20/2018  . Chronic Sacroiliac joint dysfunction (Bilateral) (R>L) 03/20/2018  . Chronic Somatic dysfunction of sacroiliac joint (Bilateral) (R>L) 03/20/2018  . Osteoarthritis of hip (Bilateral) 03/20/2018  . Cervicogenic headache (Bilateral) 03/20/2018  . Cervicalgia (Bilateral) (R>L) 03/20/2018  . Cervical facet syndrome (Bilateral) (R>L) 03/20/2018  . Chronic occipital neuralgia (Bilateral) 03/20/2018  . Chronic upper extremity pain (Fourth Area of Pain) (Bilateral) (R>L) 03/19/2018  . Ganglion cyst 02/20/2018  . Dyspepsia   . Chronic low back pain (Secondary Area of Pain) (Bilateral) (L>R) w/ sciatica (Bilateral) 01/16/2018  . Chronic lower extremity pain [facility] Area of Pain) (Bilateral) (L>R) 01/16/2018  . Chronic neck pain (Primary Area of Pain) (Bilateral) (midline) 01/16/2018  . Chronic foot pain (Left) 01/16/2018  . Chronic pain syndrome 01/16/2018  . Pharmacologic therapy 01/16/2018  . Disorder of skeletal system 01/16/2018  . Problems influencing health status 01/16/2018  . Obesity 11/21/2017  . Anxiety 09/28/2017  . Chronic knee pain (Right) 09/28/2017  . Fatty liver 07/09/2017  . Colon polyps 07/09/2017  . Fibromyalgia 07/09/2017  . Sinusitis, chronic 07/09/2017  . Allergic rhinitis 07/09/2017  . IBS (irritable bowel syndrome) 07/09/2017  . Chronic GERD 07/09/2017  .  Eyelid dermatitis, allergic/contact 07/09/2017  . Hypertension 01/12/2017  . Superficial thrombophlebitis 01/12/2017  . Rectal bleeding 12/16/2016  . Chronic sacroiliac joint pain (Right) 12/16/2016  . DDD (degenerative disc disease), lumbar 07/18/2016  . Family hx of colon cancer 07/18/2016    Elizabeth Bowers  OTR/l,CLT 02/24/2019, 4:26 PM  [facility] [address], [place], [address] Phone: [phone]   Fax:  [phone]  Name: Elizabeth Bowers MRN: [id number] Date of Birth: November 06, 1965

## 2019-02-27 ENCOUNTER — Ambulatory Visit: Payer: 59 | Admitting: Occupational Therapy

## 2019-02-27 ENCOUNTER — Other Ambulatory Visit: Payer: Self-pay

## 2019-02-27 DIAGNOSIS — M6281 Muscle weakness (generalized): Secondary | ICD-10-CM | POA: Diagnosis not present

## 2019-02-27 DIAGNOSIS — M25642 Stiffness of left hand, not elsewhere classified: Secondary | ICD-10-CM

## 2019-02-27 DIAGNOSIS — M79642 Pain in left hand: Secondary | ICD-10-CM | POA: Diagnosis not present

## 2019-02-27 DIAGNOSIS — R600 Localized edema: Secondary | ICD-10-CM | POA: Diagnosis not present

## 2019-02-27 DIAGNOSIS — M25632 Stiffness of left wrist, not elsewhere classified: Secondary | ICD-10-CM | POA: Diagnosis not present

## 2019-02-27 DIAGNOSIS — M25532 Pain in left wrist: Secondary | ICD-10-CM | POA: Diagnosis not present

## 2019-02-27 NOTE — Therapy (Signed)
[facility] [address] [place], [address] Phone: [phone]   Fax:  [phone]  Occupational Therapy Treatment  Patient Details  Name: Elizabeth Bowers MRN: [id number] Date of Birth: August 26, 1965 Referring Provider (OT): Gramig   Encounter Date: 02/27/2019  OT End of Session - 02/27/19 1648    Visit Number  11    Number of Visits  17    Date for OT Re-Evaluation  03/20/19    OT Start Time  [id number]    OT Stop Time  1430    OT Time Calculation (min)  35 min    Activity Tolerance  Patient tolerated treatment well    Behavior During Therapy  [facility] for tasks assessed/performed       Past Medical History:  Diagnosis Date  . Fatty liver   . Fibromyalgia   . GERD (gastroesophageal reflux disease)   . Hemorrhoids   . Hot flashes   . Hypertension   . IBS (irritable bowel syndrome)   . LUQ abdominal pain 10/25/2016  . Migraines   . Miscarriage    x 2; 4 live births   . Multilevel degenerative disc disease   . Vocal cord dysfunction     Past Surgical History:  Procedure Laterality Date  . ABDOMINAL HYSTERECTOMY     2012 removed cervix h/o abnormal pap   . CHOLECYSTECTOMY     2000  . COLONOSCOPY     2018 with + polpys and GIB 2/2 polyp removal   . ELBOW DEBRIDEMENT    . ESOPHAGOGASTRODUODENOSCOPY (EGD) WITH PROPOFOL N/A 02/06/2018   Procedure: ESOPHAGOGASTRODUODENOSCOPY (EGD) WITH PROPOFOL with biopsies;  Surgeon: Lin Landsman, MD;  Location: [facility];  Service: Endoscopy;  Laterality: N/A;  . FOOT SURGERY      There were no vitals filed for this visit.  Subjective Assessment - 02/27/19 1413    Subjective   Seen Dr Amedeo Plenty - still light duty but can go back with modification to my job - some bruising on lunate on MRI and edema- otherwise looking good - no pain since I seen you    Pertinent History  Pt injury date July 19th - had wrist splint at home from before and wore that - seen Dr  Amedeo Plenty on 01/13/2019 - splint to wear at work and driving - had xray - cont to have pain and some pins and neeldles morstly on radial side of dorsal hand and wrist    Patient Stated Goals  Want the pain and swelling better so I can use my hand like before - so I can do my job , knit , crochet, yardwork ,    Currently in Pain?  No/denies          AROM in elbow and wrist WNL  Strength in wrist and elbow in all planes   5/5    Pt made great progress in grip and prehension  Last time - NT this date     Assess nerve glides and elbow extention - less tightness in biceps and brachialis Still tight and tender over brachioradialis, but with graston tool Biceps was more tight prox and distal to elbow , and volar forearm in flexors tight and some pain this date     PT (Wes) done some dryneedling and biceps and  forearm flexors- 2 sticks each - with great resultsand twiching Trigger points improving over forearm, deltoid     AROM for wrist and elbow in  all planes - 5 reps each   composite extention stretch nerve glide - out to sidedone on wall- but not to push on wall to strain extensors - slight pull should be on volar forearm and elbow 5 reps  Composite N glide - 5 reps to cont with- done great and less pull And radial N glide reviewwith less of pull 5 reps  [place] over Lunate this date - 3.3MHZ, 20 %, 0.8 intensity 5 min at end of session  After needling done ice                OT Education - 02/27/19 1648    Education Details  progress and HEP    Person(s) Educated  Patient    Methods  Explanation;Demonstration;Tactile cues;Verbal cues;Handout    Comprehension  Verbalized understanding;Returned demonstration       OT Short Term Goals - 02/17/19 1431      OT SHORT TERM GOAL #1   Title  Pain on PRWHE improve with more than 20 points    Baseline  Pain at eval on PRWHE 38/50 improve greatly - pain this date 0/10 coming in and most over weekend 4-5/10     Time  3    Period  Weeks    Status  On-going    Target Date  03/10/19      OT SHORT TERM GOAL #2   Title  Pt to be independent in HEP to decrease pain , increase AROM  to use hand in 50% of ADL's    Status  Achieved        OT Long Term Goals - 02/17/19 1431      OT LONG TERM GOAL #1   Title  Pt L wrist and digits AROM WNL with pain less than 3/10 to use more than 50% in ADL"s    Baseline  AROM see flowsheet- pain 2-8/10 with use and AROM - NOW AROM WNL -pain 4-5/10 at the most with AROM - grip/prehension improve    Time  4    Period  Weeks    Status  On-going    Target Date  03/17/19      OT LONG TERM GOAL #2   Title  Function on PRWHE with more than 20 points    Baseline  at eval function score on PRWHE 33/50- assess next time    Time  4    Period  Weeks    Status  On-going    Target Date  03/17/19            Plan - 02/27/19 1649    Clinical Impression Statement  Pt is about 10 wks out from injury - seen Dr Amedeo Plenty earlier this week -MRI showed some edema and bruising at Lunate - but pt report she is using her arm more at home normally and did not had any pain since seen last time - cont to have spasms and trigger points    OT Occupational Profile and History  Problem Focused Assessment - Including review of records relating to presenting problem    Occupational performance deficits (Please refer to evaluation for details):  ADL's;IADL's;Work;Play;Leisure;Social Participation    Body Structure / Function / Physical Skills  ADL;ROM;UE functional use;Flexibility;Sensation;Edema;Pain;IADL    Rehab Potential  Fair    Clinical Decision Making  Limited treatment options, no task modification necessary    Comorbidities Affecting Occupational Performance:  May have comorbidities impacting occupational performance    Modification or Assistance to Complete Evaluation  No modification of tasks or assist necessary to complete eval    OT Frequency  2x / week    OT Duration  4 weeks     OT Treatment/Interventions  Self-care/ADL training;Iontophoresis;Patient/family education;Splinting;Paraffin;Fluidtherapy;Contrast Bath;Ultrasound;Manual Therapy;Passive range of motion    Plan  progress and soft tissue / dryneedling on eeltoid, extensors forearm    OT Home Exercise Plan  see pt instrucition    Consulted and Agree with Plan of Care  Patient       Patient will benefit from skilled therapeutic intervention in order to improve the following deficits and impairments:   Body Structure / Function / Physical Skills: ADL, ROM, UE functional use, Flexibility, Sensation, Edema, Pain, IADL       Visit Diagnosis: Pain in left hand  Muscle weakness (generalized)  Stiffness of left wrist, not elsewhere classified  Localized edema  Stiffness of left hand, not elsewhere classified  Pain in left wrist    Problem List Patient Active Problem List   Diagnosis Date Noted  . Chronic low back pain (Bilateral) w/o sciatica 08/08/2018  . Acute postoperative pain 07/23/2018  . Abnormal MRI, cervical spine (07/10/2018) 07/17/2018  . Cervical spondylitis with radiculitis (HCC) (C6) (Bilateral) (R>L) 06/26/2018  . Chronic sacroiliac joint pain (Bilateral) (R>L) 06/26/2018  . Cervical Grade 1 (2 mm) Retrolisthesis C5 over C6 03/20/2018  . Cervical foraminal stenosis (C3-4 & C5-6) (Bilateral) 03/20/2018  . DDD (degenerative disc disease), cervical 03/20/2018  . Long term prescription benzodiazepine use 03/20/2018  . CKD (chronic kidney disease) stage 3, GFR 30-59 ml/min ([place]) 03/20/2018  . Acid reflux disease 03/20/2018  . Spondylosis without myelopathy or radiculopathy, cervical region 03/20/2018  . Chronic hip pain (Bilateral) (L>R) 03/20/2018  . Lumbar facet syndrome (Bilateral) (R>L) 03/20/2018  . Spondylosis without myelopathy or radiculopathy, lumbar region 03/20/2018  . Chronic Sacroiliac joint dysfunction (Bilateral) (R>L) 03/20/2018  . Chronic Somatic dysfunction of  sacroiliac joint (Bilateral) (R>L) 03/20/2018  . Osteoarthritis of hip (Bilateral) 03/20/2018  . Cervicogenic headache (Bilateral) 03/20/2018  . Cervicalgia (Bilateral) (R>L) 03/20/2018  . Cervical facet syndrome (Bilateral) (R>L) 03/20/2018  . Chronic occipital neuralgia (Bilateral) 03/20/2018  . Chronic upper extremity pain (Fourth Area of Pain) (Bilateral) (R>L) 03/19/2018  . Ganglion cyst 02/20/2018  . Dyspepsia   . Chronic low back pain (Secondary Area of Pain) (Bilateral) (L>R) w/ sciatica (Bilateral) 01/16/2018  . Chronic lower extremity pain [facility] Area of Pain) (Bilateral) (L>R) 01/16/2018  . Chronic neck pain (Primary Area of Pain) (Bilateral) (midline) 01/16/2018  . Chronic foot pain (Left) 01/16/2018  . Chronic pain syndrome 01/16/2018  . Pharmacologic therapy 01/16/2018  . Disorder of skeletal system 01/16/2018  . Problems influencing health status 01/16/2018  . Obesity 11/21/2017  . Anxiety 09/28/2017  . Chronic knee pain (Right) 09/28/2017  . Fatty liver 07/09/2017  . Colon polyps 07/09/2017  . Fibromyalgia 07/09/2017  . Sinusitis, chronic 07/09/2017  . Allergic rhinitis 07/09/2017  . IBS (irritable bowel syndrome) 07/09/2017  . Chronic GERD 07/09/2017  . Eyelid dermatitis, allergic/contact 07/09/2017  . Hypertension 01/12/2017  . Superficial thrombophlebitis 01/12/2017  . Rectal bleeding 12/16/2016  . Chronic sacroiliac joint pain (Right) 12/16/2016  . DDD (degenerative disc disease), lumbar 07/18/2016  . Family hx of colon cancer 07/18/2016    Rosalyn Gess OTR/L,CLT 02/27/2019, 4:53 PM  [facility] [address] [place], [address] Phone: [phone]   Fax:  [phone]  Name: Brinly Shutler MRN: [id number] Date of  Birth: 01/03/1966

## 2019-03-03 ENCOUNTER — Encounter: Payer: 59 | Admitting: Occupational Therapy

## 2019-03-04 ENCOUNTER — Other Ambulatory Visit: Payer: Self-pay

## 2019-03-04 ENCOUNTER — Ambulatory Visit: Payer: 59 | Admitting: Occupational Therapy

## 2019-03-04 DIAGNOSIS — M6281 Muscle weakness (generalized): Secondary | ICD-10-CM

## 2019-03-04 DIAGNOSIS — R600 Localized edema: Secondary | ICD-10-CM

## 2019-03-04 DIAGNOSIS — M79642 Pain in left hand: Secondary | ICD-10-CM

## 2019-03-04 DIAGNOSIS — M25632 Stiffness of left wrist, not elsewhere classified: Secondary | ICD-10-CM

## 2019-03-04 DIAGNOSIS — M25642 Stiffness of left hand, not elsewhere classified: Secondary | ICD-10-CM

## 2019-03-04 DIAGNOSIS — M25532 Pain in left wrist: Secondary | ICD-10-CM | POA: Diagnosis not present

## 2019-03-04 NOTE — Therapy (Signed)
[facility] [address], [place], [address] Phone: [phone]   Fax:  [phone]  Occupational Therapy Treatment  Patient Details  Name: Elizabeth Bowers MRN: [id number] Date of Birth: 16-Nov-1965 Referring Provider (OT): Gramig   Encounter Date: 03/04/2019  OT End of Session - 03/05/19 1923    Visit Number  12    Number of Visits  17    Date for OT Re-Evaluation  03/20/19    OT Start Time  1300    OT Stop Time  1347    OT Time Calculation (min)  47 min    Activity Tolerance  Patient tolerated treatment well    Behavior During Therapy  [facility] for tasks assessed/performed       Past Medical History:  Diagnosis Date  . Fatty liver   . Fibromyalgia   . GERD (gastroesophageal reflux disease)   . Hemorrhoids   . Hot flashes   . Hypertension   . IBS (irritable bowel syndrome)   . LUQ abdominal pain 10/25/2016  . Migraines   . Miscarriage    x 2; 4 live births   . Multilevel degenerative disc disease   . Vocal cord dysfunction     Past Surgical History:  Procedure Laterality Date  . ABDOMINAL HYSTERECTOMY     2012 removed cervix h/o abnormal pap   . CHOLECYSTECTOMY     2000  . COLONOSCOPY     2018 with + polpys and GIB 2/2 polyp removal   . ELBOW DEBRIDEMENT    . ESOPHAGOGASTRODUODENOSCOPY (EGD) WITH PROPOFOL N/A 02/06/2018   Procedure: ESOPHAGOGASTRODUODENOSCOPY (EGD) WITH PROPOFOL with biopsies;  Surgeon: Lin Landsman, MD;  Location: [facility];  Service: Endoscopy;  Laterality: N/A;  . FOOT SURGERY      There were no vitals filed for this visit.  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    Pertinent History  Pt injury date July 19th - had wrist splint at home from before and wore that - seen Dr Amedeo Plenty on 01/13/2019 - splint  to wear at work and driving - had xray - cont to have pain and some pins and neeldles morstly on radial side of dorsal hand and wrist    Patient Stated Goals  Want the pain and swelling better so I can use my hand like before - so I can do my job , knit , crochet, yardwork ,    Currently in Pain?  Yes    Pain Score  5     Pain Location  Arm    Pain Orientation  Left    Pain Descriptors / Indicators  Aching    Pain Type  Acute pain    Pain Onset  More than a month ago    Pain Frequency  Constant    Multiple Pain Sites  No     Pain 2/10 aching in left UE.  Works as a Statistician Wants to use soft brace rather than hard brace , hard one makes wrist hurt. Didn't sleep well last night, reflux Exercises going well    Assessed nerve glides and elbow extension - continues to demo decreased tightness in biceps and brachialis but still has some tight and tender over brachioradialis,   Graston techniques applied  to left UE, forearm, biceps     PT ( Wes) performed dry needling, biceps and  forearm flexors  - 2 sticks each - with great results and twitching Trigger points continue to improve over forearm, deltoid       AROM for wrist and elbow in all planes - 5 reps each    composite extension stretch nerve glide - out to side done on wall- but not to push on wall to strain extensors - slight pull should be on volar forearm and elbow 5 reps  Continues to perform Composite N glide - 5 reps,radial N glide with less of pull 5 reps    [place] over Lunate this date - 3.3MHZ, 20 %, 0.8 intensity 5 min at end of session  After needling ice applied to area.    Response to treatment:   Patient continues to progress in all areas, decreased tightness in left UE, responds well with graston techniques and with dry needling.  Reduction in pain, stiffness and has been able to perform more tasks at home and work.  Continue to work towards goals in plan of care to maximize safety and independence in  necessary daily tasks.                 OT Education - 03/05/19 2034    Education Details  exercises for home program    Person(s) Educated  Patient    Methods  Explanation;Demonstration;Tactile cues;Verbal cues;Handout    Comprehension  Verbalized understanding;Returned demonstration       OT Short Term Goals - 02/17/19 1431      OT SHORT TERM GOAL #1   Title  Pain on PRWHE improve with more than 20 points    Baseline  Pain at eval on PRWHE 38/50 improve greatly - pain this date 0/10 coming in and most over weekend 4-5/10    Time  3    Period  Weeks    Status  On-going    Target Date  03/10/19      OT SHORT TERM GOAL #2   Title  Pt to be independent in HEP to decrease pain , increase AROM  to use hand in 50% of ADL's    Status  Achieved        OT Long Term Goals - 02/17/19 1431      OT LONG TERM GOAL #1   Title  Pt L wrist and digits AROM WNL with pain less than 3/10 to use more than 50% in ADL"s    Baseline  AROM see flowsheet- pain 2-8/10 with use and AROM - NOW AROM WNL -pain 4-5/10 at the most with AROM - grip/prehension improve    Time  4    Period  Weeks    Status  On-going    Target Date  03/17/19      OT LONG TERM GOAL #2   Title  Function on PRWHE with more than 20 points    Baseline  at eval function score on PRWHE 33/50- assess next time    Time  4    Period  Weeks    Status  On-going    Target Date  03/17/19            Plan - 03/05/19 1924    OT Occupational Profile and History  Problem Focused Assessment - Including review of records relating to presenting problem    Occupational performance deficits (Please refer to evaluation for details):  ADL's;IADL's;Work;Play;Leisure;Social Participation  Body Structure / Function / Physical Skills  ADL;ROM;UE functional use;Flexibility;Sensation;Edema;Pain;IADL    Rehab Potential  Fair    Clinical Decision Making  Limited treatment options, no task modification necessary    Comorbidities  Affecting Occupational Performance:  May have comorbidities impacting occupational performance    Modification or Assistance to Complete Evaluation   No modification of tasks or assist necessary to complete eval    OT Frequency  2x / week    OT Duration  4 weeks    OT Treatment/Interventions  Self-care/ADL training;Iontophoresis;Patient/family education;Splinting;Paraffin;Fluidtherapy;Contrast Bath;Ultrasound;Manual Therapy;Passive range of motion    Consulted and Agree with Plan of Care  Patient       Patient will benefit from skilled therapeutic intervention in order to improve the following deficits and impairments:   Body Structure / Function / Physical Skills: ADL, ROM, UE functional use, Flexibility, Sensation, Edema, Pain, IADL       Visit Diagnosis: Pain in left hand  Muscle weakness (generalized)  Stiffness of left wrist, not elsewhere classified  Localized edema  Stiffness of left hand, not elsewhere classified  Pain in left wrist    Problem List Patient Active Problem List   Diagnosis Date Noted  . Chronic low back pain (Bilateral) w/o sciatica 08/08/2018  . Acute postoperative pain 07/23/2018  . Abnormal MRI, cervical spine (07/10/2018) 07/17/2018  . Cervical spondylitis with radiculitis (HCC) (C6) (Bilateral) (R>L) 06/26/2018  . Chronic sacroiliac joint pain (Bilateral) (R>L) 06/26/2018  . Cervical Grade 1 (2 mm) Retrolisthesis C5 over C6 03/20/2018  . Cervical foraminal stenosis (C3-4 & C5-6) (Bilateral) 03/20/2018  . DDD (degenerative disc disease), cervical 03/20/2018  . Long term prescription benzodiazepine use 03/20/2018  . CKD (chronic kidney disease) stage 3, GFR 30-59 ml/min ([place]) 03/20/2018  . Acid reflux disease 03/20/2018  . Spondylosis without myelopathy or radiculopathy, cervical region 03/20/2018  . Chronic hip pain (Bilateral) (L>R) 03/20/2018  . Lumbar facet syndrome (Bilateral) (R>L) 03/20/2018  . Spondylosis without myelopathy or  radiculopathy, lumbar region 03/20/2018  . Chronic Sacroiliac joint dysfunction (Bilateral) (R>L) 03/20/2018  . Chronic Somatic dysfunction of sacroiliac joint (Bilateral) (R>L) 03/20/2018  . Osteoarthritis of hip (Bilateral) 03/20/2018  . Cervicogenic headache (Bilateral) 03/20/2018  . Cervicalgia (Bilateral) (R>L) 03/20/2018  . Cervical facet syndrome (Bilateral) (R>L) 03/20/2018  . Chronic occipital neuralgia (Bilateral) 03/20/2018  . Chronic upper extremity pain (Fourth Area of Pain) (Bilateral) (R>L) 03/19/2018  . Ganglion cyst 02/20/2018  . Dyspepsia   . Chronic low back pain (Secondary Area of Pain) (Bilateral) (L>R) w/ sciatica (Bilateral) 01/16/2018  . Chronic lower extremity pain [facility] Area of Pain) (Bilateral) (L>R) 01/16/2018  . Chronic neck pain (Primary Area of Pain) (Bilateral) (midline) 01/16/2018  . Chronic foot pain (Left) 01/16/2018  . Chronic pain syndrome 01/16/2018  . Pharmacologic therapy 01/16/2018  . Disorder of skeletal system 01/16/2018  . Problems influencing health status 01/16/2018  . Obesity 11/21/2017  . Anxiety 09/28/2017  . Chronic knee pain (Right) 09/28/2017  . Fatty liver 07/09/2017  . Colon polyps 07/09/2017  . Fibromyalgia 07/09/2017  . Sinusitis, chronic 07/09/2017  . Allergic rhinitis 07/09/2017  . IBS (irritable bowel syndrome) 07/09/2017  . Chronic GERD 07/09/2017  . Eyelid dermatitis, allergic/contact 07/09/2017  . Hypertension 01/12/2017  . Superficial thrombophlebitis 01/12/2017  . Rectal bleeding 12/16/2016  . Chronic sacroiliac joint pain (Right) 12/16/2016  . DDD (degenerative disc disease), lumbar 07/18/2016  . Family hx of colon cancer 07/18/2016   Amy T Lovett, OTR/L, CLT  Lovett,Amy 03/05/2019, 8:36 PM  [facility] [address] [place], [address] Phone: [phone]   Fax:  [phone]  Name: Elizabeth Bowers MRN: [id number] Date of Birth:  1965-12-18

## 2019-03-06 ENCOUNTER — Ambulatory Visit: Payer: PRIVATE HEALTH INSURANCE | Attending: Orthopedic Surgery | Admitting: Occupational Therapy

## 2019-03-06 ENCOUNTER — Encounter: Payer: Self-pay | Admitting: Occupational Therapy

## 2019-03-06 ENCOUNTER — Other Ambulatory Visit: Payer: Self-pay

## 2019-03-06 DIAGNOSIS — M25642 Stiffness of left hand, not elsewhere classified: Secondary | ICD-10-CM

## 2019-03-06 DIAGNOSIS — M25532 Pain in left wrist: Secondary | ICD-10-CM

## 2019-03-06 DIAGNOSIS — M6281 Muscle weakness (generalized): Secondary | ICD-10-CM

## 2019-03-06 DIAGNOSIS — R600 Localized edema: Secondary | ICD-10-CM

## 2019-03-06 DIAGNOSIS — M79642 Pain in left hand: Secondary | ICD-10-CM | POA: Diagnosis present

## 2019-03-06 DIAGNOSIS — M25632 Stiffness of left wrist, not elsewhere classified: Secondary | ICD-10-CM

## 2019-03-07 NOTE — Therapy (Signed)
[facility] [address] [place], [address] Phone: [phone]   Fax:  [phone]  Occupational Therapy Treatment  Patient Details  Name: Elizabeth Bowers MRN: [id number] Date of Birth: 08-09-1965 Referring Provider (OT): Gramig   Encounter Date: 03/06/2019  OT End of Session - 03/06/19 1149    Visit Number  13    Number of Visits  17    Date for OT Re-Evaluation  03/20/19    OT Start Time  1128    OT Stop Time  1225    OT Time Calculation (min)  57 min    Activity Tolerance  Patient tolerated treatment well    Behavior During Therapy  [facility] for tasks assessed/performed       Past Medical History:  Diagnosis Date  . Fatty liver   . Fibromyalgia   . GERD (gastroesophageal reflux disease)   . Hemorrhoids   . Hot flashes   . Hypertension   . IBS (irritable bowel syndrome)   . LUQ abdominal pain 10/25/2016  . Migraines   . Miscarriage    x 2; 4 live births   . Multilevel degenerative disc disease   . Vocal cord dysfunction     Past Surgical History:  Procedure Laterality Date  . ABDOMINAL HYSTERECTOMY     2012 removed cervix h/o abnormal pap   . CHOLECYSTECTOMY     2000  . COLONOSCOPY     2018 with + polpys and GIB 2/2 polyp removal   . ELBOW DEBRIDEMENT    . ESOPHAGOGASTRODUODENOSCOPY (EGD) WITH PROPOFOL N/A 02/06/2018   Procedure: ESOPHAGOGASTRODUODENOSCOPY (EGD) WITH PROPOFOL with biopsies;  Surgeon: Lin Landsman, MD;  Location: [facility];  Service: Endoscopy;  Laterality: N/A;  . FOOT SURGERY      There were no vitals filed for this visit.  Subjective Assessment - 03/06/19 1148    Subjective   Patient reports she was running late this date due to traffic.  Reports she is doing well, off work this date.    Pertinent History  Pt injury date July 19th - had wrist splint at home from before and wore that - seen Dr Amedeo Plenty on 01/13/2019 - splint to wear at work and driving - had  xray - cont to have pain and some pins and neeldles morstly on radial side of dorsal hand and wrist    Patient Stated Goals  Want the pain and swelling better so I can use my hand like before - so I can do my job , knit , crochet, yardwork ,    Currently in Pain?  Yes    Pain Score  3     Pain Location  Arm    Pain Orientation  Left    Pain Descriptors / Indicators  Aching    Pain Type  Acute pain    Pain Onset  More than a month ago    Pain Frequency  Constant    Multiple Pain Sites  No        Manual Techniques:  Graston techniques applied to left UE, forearm, biceps, deltoid Soft tissue mobs to left arm    PT (Wes) performed dry needling, biceps andforearm extensors, deltoid (3 points this date), with continued resultsand twitching Trigger points continue to improve over forearm, deltoid  AROM for wrist and elbow in all planes - 5 reps each  Composite extension stretch nerve glide on the wall with slight pull on volar forearm  and elbow 5 rep. Composite N glide - 5 reps,radial N glide with less of pull 5 reps  Nerve glides and elbow extension - continues to demo decreased tightness in biceps and brachialis but still has some residual tight and tender over brachioradialis but improving.  [place] over Lunate this date - 3.3MHZ, 20 %, 0.8 intensity 5 min at end of session  After needling ice applied to area.      Response to tx: Patient responding well to manual skills followed by dry needling techniques, reports pain relief, decreased tightness in LUE and able to continue to perform modified work tasks.  Patient able to demonstrate exercises for ROM, nerve glides with occasional cues. Pain decreased to 3/10.  Continue to work towards goals to improve LUE function for work and home tasks.                OT Education - 03/07/19 1830    Education Details  HEP    Person(s) Educated  Patient    Methods  Explanation;Demonstration;Tactile cues;Verbal  cues;Handout    Comprehension  Verbalized understanding;Returned demonstration       OT Short Term Goals - 02/17/19 1431      OT SHORT TERM GOAL #1   Title  Pain on PRWHE improve with more than 20 points    Baseline  Pain at eval on PRWHE 38/50 improve greatly - pain this date 0/10 coming in and most over weekend 4-5/10    Time  3    Period  Weeks    Status  On-going    Target Date  03/10/19      OT SHORT TERM GOAL #2   Title  Pt to be independent in HEP to decrease pain , increase AROM  to use hand in 50% of ADL's    Status  Achieved        OT Long Term Goals - 02/17/19 1431      OT LONG TERM GOAL #1   Title  Pt L wrist and digits AROM WNL with pain less than 3/10 to use more than 50% in ADL"s    Baseline  AROM see flowsheet- pain 2-8/10 with use and AROM - NOW AROM WNL -pain 4-5/10 at the most with AROM - grip/prehension improve    Time  4    Period  Weeks    Status  On-going    Target Date  03/17/19      OT LONG TERM GOAL #2   Title  Function on PRWHE with more than 20 points    Baseline  at eval function score on PRWHE 33/50- assess next time    Time  4    Period  Weeks    Status  On-going    Target Date  03/17/19            Plan - 03/06/19 1149    OT Occupational Profile and History  Problem Focused Assessment - Including review of records relating to presenting problem    Occupational performance deficits (Please refer to evaluation for details):  ADL's;IADL's;Work;Play;Leisure;Social Participation    Body Structure / Function / Physical Skills  ADL;ROM;UE functional use;Flexibility;Sensation;Edema;Pain;IADL    Clinical Decision Making  Limited treatment options, no task modification necessary    Comorbidities Affecting Occupational Performance:  May have comorbidities impacting occupational performance    Modification or Assistance to Complete Evaluation   No modification of tasks or assist necessary to complete eval    OT Frequency  2x / week  OT  Duration  4 weeks    OT Treatment/Interventions  Self-care/ADL training;Iontophoresis;Patient/family education;Splinting;Paraffin;Fluidtherapy;Contrast Bath;Ultrasound;Manual Therapy;Passive range of motion    Consulted and Agree with Plan of Care  Patient       Patient will benefit from skilled therapeutic intervention in order to improve the following deficits and impairments:   Body Structure / Function / Physical Skills: ADL, ROM, UE functional use, Flexibility, Sensation, Edema, Pain, IADL       Visit Diagnosis: Pain in left hand  Muscle weakness (generalized)  Stiffness of left wrist, not elsewhere classified  Localized edema  Stiffness of left hand, not elsewhere classified  Pain in left wrist    Problem List Patient Active Problem List   Diagnosis Date Noted  . Chronic low back pain (Bilateral) w/o sciatica 08/08/2018  . Acute postoperative pain 07/23/2018  . Abnormal MRI, cervical spine (07/10/2018) 07/17/2018  . Cervical spondylitis with radiculitis (HCC) (C6) (Bilateral) (R>L) 06/26/2018  . Chronic sacroiliac joint pain (Bilateral) (R>L) 06/26/2018  . Cervical Grade 1 (2 mm) Retrolisthesis C5 over C6 03/20/2018  . Cervical foraminal stenosis (C3-4 & C5-6) (Bilateral) 03/20/2018  . DDD (degenerative disc disease), cervical 03/20/2018  . Long term prescription benzodiazepine use 03/20/2018  . CKD (chronic kidney disease) stage 3, GFR 30-59 ml/min 03/20/2018  . Acid reflux disease 03/20/2018  . Spondylosis without myelopathy or radiculopathy, cervical region 03/20/2018  . Chronic hip pain (Bilateral) (L>R) 03/20/2018  . Lumbar facet syndrome (Bilateral) (R>L) 03/20/2018  . Spondylosis without myelopathy or radiculopathy, lumbar region 03/20/2018  . Chronic Sacroiliac joint dysfunction (Bilateral) (R>L) 03/20/2018  . Chronic Somatic dysfunction of sacroiliac joint (Bilateral) (R>L) 03/20/2018  . Osteoarthritis of hip (Bilateral) 03/20/2018  . Cervicogenic headache  (Bilateral) 03/20/2018  . Cervicalgia (Bilateral) (R>L) 03/20/2018  . Cervical facet syndrome (Bilateral) (R>L) 03/20/2018  . Chronic occipital neuralgia (Bilateral) 03/20/2018  . Chronic upper extremity pain (Fourth Area of Pain) (Bilateral) (R>L) 03/19/2018  . Ganglion cyst 02/20/2018  . Dyspepsia   . Chronic low back pain (Secondary Area of Pain) (Bilateral) (L>R) w/ sciatica (Bilateral) 01/16/2018  . Chronic lower extremity pain [facility] Area of Pain) (Bilateral) (L>R) 01/16/2018  . Chronic neck pain (Primary Area of Pain) (Bilateral) (midline) 01/16/2018  . Chronic foot pain (Left) 01/16/2018  . Chronic pain syndrome 01/16/2018  . Pharmacologic therapy 01/16/2018  . Disorder of skeletal system 01/16/2018  . Problems influencing health status 01/16/2018  . Obesity 11/21/2017  . Anxiety 09/28/2017  . Chronic knee pain (Right) 09/28/2017  . Fatty liver 07/09/2017  . Colon polyps 07/09/2017  . Fibromyalgia 07/09/2017  . Sinusitis, chronic 07/09/2017  . Allergic rhinitis 07/09/2017  . IBS (irritable bowel syndrome) 07/09/2017  . Chronic GERD 07/09/2017  . Eyelid dermatitis, allergic/contact 07/09/2017  . Hypertension 01/12/2017  . Superficial thrombophlebitis 01/12/2017  . Rectal bleeding 12/16/2016  . Chronic sacroiliac joint pain (Right) 12/16/2016  . DDD (degenerative disc disease), lumbar 07/18/2016  . Family hx of colon cancer 07/18/2016   Emalea Mix T Tomasita Morrow, OTR/L, CLT  Astella Desir 03/07/2019, 6:39 PM  [facility] [address], [place], [address] Phone: [phone]   Fax:  [phone]  Name: Elizabeth Bowers MRN: [id number] Date of Birth: Dec 27, 1965

## 2019-03-10 MED FILL — TRIAMTERENE-HCTZ 37.5-25 MG: 37.5-25 | 90 days supply | Qty: 90 | Fill #1

## 2019-03-11 ENCOUNTER — Other Ambulatory Visit: Payer: Self-pay

## 2019-03-11 ENCOUNTER — Ambulatory Visit: Payer: 59 | Attending: Orthopedic Surgery | Admitting: Occupational Therapy

## 2019-03-11 DIAGNOSIS — M25532 Pain in left wrist: Secondary | ICD-10-CM

## 2019-03-11 DIAGNOSIS — R600 Localized edema: Secondary | ICD-10-CM | POA: Diagnosis not present

## 2019-03-11 DIAGNOSIS — M79642 Pain in left hand: Secondary | ICD-10-CM | POA: Diagnosis not present

## 2019-03-11 DIAGNOSIS — M25632 Stiffness of left wrist, not elsewhere classified: Secondary | ICD-10-CM | POA: Diagnosis not present

## 2019-03-11 DIAGNOSIS — M25642 Stiffness of left hand, not elsewhere classified: Secondary | ICD-10-CM | POA: Diagnosis not present

## 2019-03-11 DIAGNOSIS — M6281 Muscle weakness (generalized): Secondary | ICD-10-CM

## 2019-03-11 NOTE — Patient Instructions (Signed)
Add extended elbow- forearm extensors stretch  In pronation and neutral 5 reps hold 5 sec

## 2019-03-11 NOTE — Therapy (Signed)
[facility] [address], [place], [address] Phone: [phone]   Fax:  [phone]  Occupational Therapy Treatment  Patient Details  Name: Elizabeth Bowers MRN: [id number] Date of Birth: 1965/06/22 Referring Provider (OT): Gramig   Encounter Date: 03/11/2019  OT End of Session - 03/11/19 1325    Visit Number  14    Number of Visits  17    Date for OT Re-Evaluation  03/20/19    OT Start Time  [id number]    OT Stop Time  1343    OT Time Calculation (min)  38 min    Activity Tolerance  Patient tolerated treatment well    Behavior During Therapy  [facility] for tasks assessed/performed       Past Medical History:  Diagnosis Date  . Fatty liver   . Fibromyalgia   . GERD (gastroesophageal reflux disease)   . Hemorrhoids   . Hot flashes   . Hypertension   . IBS (irritable bowel syndrome)   . LUQ abdominal pain 10/25/2016  . Migraines   . Miscarriage    x 2; 4 live births   . Multilevel degenerative disc disease   . Vocal cord dysfunction     Past Surgical History:  Procedure Laterality Date  . ABDOMINAL HYSTERECTOMY     2012 removed cervix h/o abnormal pap   . CHOLECYSTECTOMY     2000  . COLONOSCOPY     2018 with + polpys and GIB 2/2 polyp removal   . ELBOW DEBRIDEMENT    . ESOPHAGOGASTRODUODENOSCOPY (EGD) WITH PROPOFOL N/A 02/06/2018   Procedure: ESOPHAGOGASTRODUODENOSCOPY (EGD) WITH PROPOFOL with biopsies;  Surgeon: Lin Landsman, MD;  Location: [place];  Service: Endoscopy;  Laterality: N/A;  . FOOT SURGERY      There were no vitals filed for this visit.  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  No/denies         [facility] OT Assessment - 03/11/19 0001      Strength   Right Hand Grip (lbs)  68    Right Hand Lateral Pinch  18 lbs    Right Hand 3 Point Pinch  20 lbs    Left Hand Grip (lbs)  60    Left Hand Lateral Pinch  18 lbs    Left Hand 3 Point Pinch  18 lbs        AROM in elbow and wrist WNL -  Strength 5/5 in all planes  with Graston some tightness in mid forearm volar -and extensor mass - feeling it after work  And with Graston tool   Had PT done some dry needling -(Wes) - 2  sticks and good twitching  Did add this date   extended elbow- forearm extensors stretch  In pronation and neutral 5 reps hold 5 sec     composite extention stretch nerve glide - out to sidedone on wall- but not to push on wall to strain extensors - slight pull should be on volar forearm and elbow 5 reps  Composite N glide - 5 reps to cont with- done great and less pull And radial N glide reviewwith less of pull 5 reps    OT Treatments/Exercises (OP) - 03/11/19 0001      Cryotherapy   Number Minutes Cryotherapy  4 Minutes    Cryotherapy Location  Forearm    Type of Cryotherapy  Ice pack      Ultrasound   Ultrasound Location  volar wrist - lunate    Ultrasound Parameters  3.3MHZ 20 % ,     Ultrasound Goals  Pain      end of session  Same time than ice        OT Education - 03/11/19 1324    Education Details  progress and HEP    Person(s) Educated  Patient    Methods  Explanation;Demonstration;Tactile cues;Verbal cues;Handout    Comprehension  Verbalized understanding;Returned demonstration       OT Short Term Goals - 02/17/19 1431      OT SHORT TERM  GOAL #1   Title  Pain on PRWHE improve with more than 20 points    Baseline  Pain at eval on PRWHE 38/50 improve greatly - pain this date 0/10 coming in and most over weekend 4-5/10    Time  3    Period  Weeks    Status  On-going    Target Date  03/10/19      OT SHORT TERM GOAL #2   Title  Pt to be independent in HEP to decrease pain , increase AROM  to use hand in 50% of ADL's    Status  Achieved        OT Long Term Goals - 02/17/19 1431      OT LONG TERM GOAL #1   Title  Pt L wrist and digits AROM WNL with pain less than 3/10 to use more than 50% in ADL"s    Baseline  AROM see flowsheet- pain 2-8/10 with use and AROM - NOW AROM WNL -pain 4-5/10 at the most with AROM - grip/prehension improve    Time  4    Period  Weeks    Status  On-going    Target Date  03/17/19      OT LONG TERM GOAL #2   Title  Function on PRWHE with more than 20 points    Baseline  at eval function score on PRWHE 33/50- assess next time    Time  4    Period  Weeks    Status  On-going    Target Date  03/17/19            Plan - 03/11/19 1325    Clinical Impression Statement  Pt report decrease pain with use - back to her job - but light duty - pain end of day 4/10 and mostly over forearm extensors- done some dryneedling - and add this date some extensor stretches to foream - but keep it under 2/10    OT Occupational Profile and History  Problem Focused Assessment - Including review of records relating to presenting problem    Occupational performance deficits (Please  refer to evaluation for details):  ADL's;IADL's;Work;Play;Leisure;Social Participation    Body Structure / Function / Physical Skills  ADL;ROM;UE functional use;Flexibility;Sensation;Edema;Pain;IADL    Rehab Potential  Fair    Clinical Decision Making  Limited treatment options, no task modification necessary    Comorbidities Affecting Occupational Performance:  May have comorbidities impacting occupational performance    Modification  or Assistance to Complete Evaluation   No modification of tasks or assist necessary to complete eval    OT Frequency  2x / week    OT Duration  4 weeks    OT Treatment/Interventions  Self-care/ADL training;Iontophoresis;Patient/family education;Splinting;Paraffin;Fluidtherapy;Contrast Bath;Ultrasound;Manual Therapy;Passive range of motion    Plan  assess progress and assess what to adress with soft tissue and dryneedling    OT Home Exercise Plan  see pt instrucition    Consulted and Agree with Plan of Care  Patient       Patient will benefit from skilled therapeutic intervention in order to improve the following deficits and impairments:   Body Structure / Function / Physical Skills: ADL, ROM, UE functional use, Flexibility, Sensation, Edema, Pain, IADL       Visit Diagnosis: Pain in left hand  Muscle weakness (generalized)  Stiffness of left wrist, not elsewhere classified  Localized edema  Stiffness of left hand, not elsewhere classified  Pain in left wrist    Problem List Patient Active Problem List   Diagnosis Date Noted  . Chronic low back pain (Bilateral) w/o sciatica 08/08/2018  . Acute postoperative pain 07/23/2018  . Abnormal MRI, cervical spine (07/10/2018) 07/17/2018  . Cervical spondylitis with radiculitis (HCC) (C6) (Bilateral) (R>L) 06/26/2018  . Chronic sacroiliac joint pain (Bilateral) (R>L) 06/26/2018  . Cervical Grade 1 (2 mm) Retrolisthesis C5 over C6 03/20/2018  . Cervical foraminal stenosis (C3-4 & C5-6) (Bilateral) 03/20/2018  . DDD (degenerative disc disease), cervical 03/20/2018  . Long term prescription benzodiazepine use 03/20/2018  . CKD (chronic kidney disease) stage 3, GFR 30-59 ml/min 03/20/2018  . Acid reflux disease 03/20/2018  . Spondylosis without myelopathy or radiculopathy, cervical region 03/20/2018  . Chronic hip pain (Bilateral) (L>R) 03/20/2018  . Lumbar facet syndrome (Bilateral) (R>L) 03/20/2018  . Spondylosis without myelopathy  or radiculopathy, lumbar region 03/20/2018  . Chronic Sacroiliac joint dysfunction (Bilateral) (R>L) 03/20/2018  . Chronic Somatic dysfunction of sacroiliac joint (Bilateral) (R>L) 03/20/2018  . Osteoarthritis of hip (Bilateral) 03/20/2018  . Cervicogenic headache (Bilateral) 03/20/2018  . Cervicalgia (Bilateral) (R>L) 03/20/2018  . Cervical facet syndrome (Bilateral) (R>L) 03/20/2018  . Chronic occipital neuralgia (Bilateral) 03/20/2018  . Chronic upper extremity pain (Fourth Area of Pain) (Bilateral) (R>L) 03/19/2018  . Ganglion cyst 02/20/2018  . Dyspepsia   . Chronic low back pain (Secondary Area of Pain) (Bilateral) (L>R) w/ sciatica (Bilateral) 01/16/2018  . Chronic lower extremity pain [facility] Area of Pain) (Bilateral) (L>R) 01/16/2018  . Chronic neck pain (Primary Area of Pain) (Bilateral) (midline) 01/16/2018  . Chronic foot pain (Left) 01/16/2018  . Chronic pain syndrome 01/16/2018  . Pharmacologic therapy 01/16/2018  . Disorder of skeletal system 01/16/2018  . Problems influencing health status 01/16/2018  . Obesity 11/21/2017  . Anxiety 09/28/2017  . Chronic knee pain (Right) 09/28/2017  . Fatty liver 07/09/2017  . Colon polyps 07/09/2017  . Fibromyalgia 07/09/2017  . Sinusitis, chronic 07/09/2017  . Allergic rhinitis 07/09/2017  . IBS (irritable bowel syndrome) 07/09/2017  . Chronic GERD 07/09/2017  . Eyelid dermatitis, allergic/contact 07/09/2017  . Hypertension 01/12/2017  . Superficial thrombophlebitis 01/12/2017  . Rectal  bleeding 12/16/2016  . Chronic sacroiliac joint pain (Right) 12/16/2016  . DDD (degenerative disc disease), lumbar 07/18/2016  . Family hx of colon cancer 07/18/2016    Rosalyn Gess OTR/l,CLT 03/11/2019, 2:26 PM  [facility] [address], [place], [address] Phone: [phone]   Fax:  [phone]  Name: Elizabeth Bowers MRN: [id number] Date of Birth: 03-04-1966

## 2019-03-20 ENCOUNTER — Other Ambulatory Visit: Payer: Self-pay

## 2019-03-20 ENCOUNTER — Ambulatory Visit: Payer: PRIVATE HEALTH INSURANCE | Admitting: Occupational Therapy

## 2019-03-20 DIAGNOSIS — M79642 Pain in left hand: Secondary | ICD-10-CM

## 2019-03-20 DIAGNOSIS — M6281 Muscle weakness (generalized): Secondary | ICD-10-CM

## 2019-03-20 DIAGNOSIS — M25642 Stiffness of left hand, not elsewhere classified: Secondary | ICD-10-CM

## 2019-03-20 DIAGNOSIS — R600 Localized edema: Secondary | ICD-10-CM

## 2019-03-20 DIAGNOSIS — M25632 Stiffness of left wrist, not elsewhere classified: Secondary | ICD-10-CM

## 2019-03-20 DIAGNOSIS — M25532 Pain in left wrist: Secondary | ICD-10-CM

## 2019-03-20 NOTE — Patient Instructions (Signed)
same

## 2019-03-20 NOTE — Therapy (Signed)
[facility] [address], [place], [address] Phone: [phone]   Fax:  [phone]  Occupational Therapy Treatment  Patient Details  Name: Elizabeth Bowers MRN: [id number] Date of Birth: [birth year] Referring Provider (OT): Gramig   Encounter Date: 03/20/2019  OT End of Session - 03/20/19 1327    Visit Number  15    Number of Visits  17    Date for OT Re-Evaluation  03/20/19    OT Start Time  1300    OT Stop Time  1345    OT Time Calculation (min)  45 min    Activity Tolerance  Patient tolerated treatment well    Behavior During Therapy  [facility] for tasks assessed/performed       Past Medical History:  Diagnosis Date  . Fatty liver   . Fibromyalgia   . GERD (gastroesophageal reflux disease)   . Hemorrhoids   . Hot flashes   . Hypertension   . IBS (irritable bowel syndrome)   . LUQ abdominal pain 10/25/2016  . Migraines   . Miscarriage    x 2; 4 live births   . Multilevel degenerative disc disease   . Vocal cord dysfunction     Past Surgical History:  Procedure Laterality Date  . ABDOMINAL HYSTERECTOMY     2012 removed cervix h/o abnormal pap   . CHOLECYSTECTOMY     2000  . COLONOSCOPY     2018 with + polpys and GIB 2/2 polyp removal   . ELBOW DEBRIDEMENT    . ESOPHAGOGASTRODUODENOSCOPY (EGD) WITH PROPOFOL N/A 02/06/2018   Procedure: ESOPHAGOGASTRODUODENOSCOPY (EGD) WITH PROPOFOL with biopsies;  Surgeon: Lin Landsman, MD;  Location: [facility];  Service: Endoscopy;  Laterality: N/A;  . FOOT SURGERY      There were no vitals filed for this visit.  Subjective Assessment - 03/20/19 1322    Subjective   No pain is coming in - at the end of day it is about 3-4/10 - I am using my hand as normal except pushing carts or anything heavy    Pertinent History  Pt injury date July 19th - had wrist splint at home from before and wore that - seen Dr Amedeo Plenty on 01/13/2019 - splint to wear at work  and driving - had xray - cont to have pain and some pins and neeldles morstly on radial side of dorsal hand and wrist    Patient Stated Goals  Want the pain and swelling better so I can use my hand like before - so I can do my job , knit , crochet, yardwork ,    Currently in Pain?  No/denies         [facility] OT Assessment - 03/20/19 0001      Strength   Right Hand Grip (lbs)  65   extended arm 65   Right Hand Lateral Pinch  16 lbs    Right Hand 3 Point Pinch  17 lbs    Left Hand Grip (lbs)  60   extended arm 56   Left Hand Lateral Pinch  17 lbs    Left Hand 3 Point Pinch  16 lbs          AROM in elbow and wrist WNL -  Strength 5/5 in all planes  with Graston some tightness biceps , extensor mass  And mid deltoid - feeling it after work  And with Graston tool   Had PT done some  dry needling -(Wes) - 3 sticks and good twitching  Pt to cont with    extended elbow- forearm extensors stretch  In pronation and neutral 5 reps hold 5 sec - done in clinic this date    composite extention stretch nerve glide - out to sidedone on wall- but not to push on wall to strain extensors - slight pull should be on volar forearm and elbow 5 reps  Composite N glide - 5 reps to cont with- done great and less pull And radial N glide reviewwith less of pull 5 reps      OT Treatments/Exercises (OP) - 03/20/19 0001      Cryotherapy   Number Minutes Cryotherapy  4 Minutes    Cryotherapy Location  Forearm   elbow , upper arm    Type of Cryotherapy  Ice pack      Ultrasound   Ultrasound Location  volar wrist , lunate     Ultrasound Parameters  3.3MHZ 20%, 0.8 intensity    Ultrasound Goals  Pain             OT Education - 03/20/19 1327    Education Details  progress and HEP    Person(s) Educated  Patient    Methods  Explanation;Demonstration;Tactile cues;Verbal cues;Handout    Comprehension  Verbalized understanding;Returned demonstration       OT Short Term Goals -  02/17/19 1431      OT SHORT TERM GOAL #1   Title  Pain on PRWHE improve with more than 20 points    Baseline  Pain at eval on PRWHE 38/50 improve greatly - pain this date 0/10 coming in and most over weekend 4-5/10    Time  3    Period  Weeks    Status  On-going    Target Date  03/10/19      OT SHORT TERM GOAL #2   Title  Pt to be independent in HEP to decrease pain , increase AROM  to use hand in 50% of ADL's    Status  Achieved        OT Long Term Goals - 02/17/19 1431      OT LONG TERM GOAL #1   Title  Pt L wrist and digits AROM WNL with pain less than 3/10 to use more than 50% in ADL"s    Baseline  AROM see flowsheet- pain 2-8/10 with use and AROM - NOW AROM WNL -pain 4-5/10 at the most with AROM - grip/prehension improve    Time  4    Period  Weeks    Status  On-going    Target Date  03/17/19      OT LONG TERM GOAL #2   Title  Function on PRWHE with more than 20 points    Baseline  at eval function score on PRWHE 33/50- assess next time    Time  4    Period  Weeks    Status  On-going    Target Date  03/17/19            Plan - 03/20/19 1327    Clinical Impression Statement  Pt was not seen for week - report she is using her hand normall except with heavy stuff- do have newborn grand baby  - and picking up and holding her - had some more trigger point this date but pain still some days at the end of day 3/10    OT Occupational Profile and History  Problem Focused Assessment -  Including review of records relating to presenting problem    Occupational performance deficits (Please refer to evaluation for details):  ADL's;IADL's;Work;Play;Leisure;Social Participation    Body Structure / Function / Physical Skills  ADL;ROM;UE functional use;Flexibility;Sensation;Edema;Pain;IADL    Rehab Potential  Fair    Clinical Decision Making  Limited treatment options, no task modification necessary    Comorbidities Affecting Occupational Performance:  May have comorbidities  impacting occupational performance    Modification or Assistance to Complete Evaluation   No modification of tasks or assist necessary to complete eval    OT Frequency  2x / week    OT Duration  2 weeks    OT Treatment/Interventions  Self-care/ADL training;Iontophoresis;Patient/family education;Splinting;Paraffin;Fluidtherapy;Contrast Bath;Ultrasound;Manual Therapy;Passive range of motion    Plan  assess progress and assess what to adress with soft tissue and dryneedling    OT Home Exercise Plan  see pt instrucition    Consulted and Agree with Plan of Care  Patient       Patient will benefit from skilled therapeutic intervention in order to improve the following deficits and impairments:   Body Structure / Function / Physical Skills: ADL, ROM, UE functional use, Flexibility, Sensation, Edema, Pain, IADL       Visit Diagnosis: Muscle weakness (generalized)  Pain in left hand  Stiffness of left wrist, not elsewhere classified  Localized edema  Stiffness of left hand, not elsewhere classified  Pain in left wrist    Problem List Patient Active Problem List   Diagnosis Date Noted  . Chronic low back pain (Bilateral) w/o sciatica 08/08/2018  . Acute postoperative pain 07/23/2018  . Abnormal MRI, cervical spine (07/10/2018) 07/17/2018  . Cervical spondylitis with radiculitis (HCC) (C6) (Bilateral) (R>L) 06/26/2018  . Chronic sacroiliac joint pain (Bilateral) (R>L) 06/26/2018  . Cervical Grade 1 (2 mm) Retrolisthesis C5 over C6 03/20/2018  . Cervical foraminal stenosis (C3-4 & C5-6) (Bilateral) 03/20/2018  . DDD (degenerative disc disease), cervical 03/20/2018  . Long term prescription benzodiazepine use 03/20/2018  . CKD (chronic kidney disease) stage 3, GFR 30-59 ml/min 03/20/2018  . Acid reflux disease 03/20/2018  . Spondylosis without myelopathy or radiculopathy, cervical region 03/20/2018  . Chronic hip pain (Bilateral) (L>R) 03/20/2018  . Lumbar facet syndrome (Bilateral)  (R>L) 03/20/2018  . Spondylosis without myelopathy or radiculopathy, lumbar region 03/20/2018  . Chronic Sacroiliac joint dysfunction (Bilateral) (R>L) 03/20/2018  . Chronic Somatic dysfunction of sacroiliac joint (Bilateral) (R>L) 03/20/2018  . Osteoarthritis of hip (Bilateral) 03/20/2018  . Cervicogenic headache (Bilateral) 03/20/2018  . Cervicalgia (Bilateral) (R>L) 03/20/2018  . Cervical facet syndrome (Bilateral) (R>L) 03/20/2018  . Chronic occipital neuralgia (Bilateral) 03/20/2018  . Chronic upper extremity pain (Fourth Area of Pain) (Bilateral) (R>L) 03/19/2018  . Ganglion cyst 02/20/2018  . Dyspepsia   . Chronic low back pain (Secondary Area of Pain) (Bilateral) (L>R) w/ sciatica (Bilateral) 01/16/2018  . Chronic lower extremity pain [facility] Area of Pain) (Bilateral) (L>R) 01/16/2018  . Chronic neck pain (Primary Area of Pain) (Bilateral) (midline) 01/16/2018  . Chronic foot pain (Left) 01/16/2018  . Chronic pain syndrome 01/16/2018  . Pharmacologic therapy 01/16/2018  . Disorder of skeletal system 01/16/2018  . Problems influencing health status 01/16/2018  . Obesity 11/21/2017  . Anxiety 09/28/2017  . Chronic knee pain (Right) 09/28/2017  . Fatty liver 07/09/2017  . Colon polyps 07/09/2017  . Fibromyalgia 07/09/2017  . Sinusitis, chronic 07/09/2017  . Allergic rhinitis 07/09/2017  . IBS (irritable bowel syndrome) 07/09/2017  . Chronic GERD 07/09/2017  . Eyelid  dermatitis, allergic/contact 07/09/2017  . Hypertension 01/12/2017  . Superficial thrombophlebitis 01/12/2017  . Rectal bleeding 12/16/2016  . Chronic sacroiliac joint pain (Right) 12/16/2016  . DDD (degenerative disc disease), lumbar 07/18/2016  . Family hx of colon cancer 07/18/2016    Rosalyn Gess OTR/L,CLT 03/20/2019, 2:24 PM  [facility] [address], [place], [address] Phone: [phone]   Fax:  [phone]  Name: Jerica Swaggerty MRN: [id number] Date of Birth: [birth year]

## 2019-03-26 ENCOUNTER — Other Ambulatory Visit: Payer: Self-pay

## 2019-03-26 ENCOUNTER — Ambulatory Visit: Payer: PRIVATE HEALTH INSURANCE | Admitting: Occupational Therapy

## 2019-03-26 DIAGNOSIS — M79642 Pain in left hand: Secondary | ICD-10-CM | POA: Diagnosis not present

## 2019-03-26 DIAGNOSIS — M25632 Stiffness of left wrist, not elsewhere classified: Secondary | ICD-10-CM

## 2019-03-26 DIAGNOSIS — M25532 Pain in left wrist: Secondary | ICD-10-CM

## 2019-03-26 DIAGNOSIS — M6281 Muscle weakness (generalized): Secondary | ICD-10-CM

## 2019-03-26 DIAGNOSIS — M25642 Stiffness of left hand, not elsewhere classified: Secondary | ICD-10-CM

## 2019-03-26 DIAGNOSIS — R600 Localized edema: Secondary | ICD-10-CM

## 2019-03-26 NOTE — Therapy (Signed)
[facility] [address] [place], [address] Phone: [phone]   Fax:  [phone]  Occupational Therapy Evaluation  Patient Details  Name: Elizabeth Bowers MRN: [id number] Date of Birth: [birth year] Referring Provider (OT): Gramig   Encounter Date: 03/26/2019  OT End of Session - 03/26/19 1326    Visit Number  16    Number of Visits  20    Date for OT Re-Evaluation  04/23/19    OT Start Time  [id number]    OT Stop Time  1344    OT Time Calculation (min)  39 min    Activity Tolerance  Patient tolerated treatment well    Behavior During Therapy  [facility] for tasks assessed/performed       Past Medical History:  Diagnosis Date  . Fatty liver   . Fibromyalgia   . GERD (gastroesophageal reflux disease)   . Hemorrhoids   . Hot flashes   . Hypertension   . IBS (irritable bowel syndrome)   . LUQ abdominal pain 10/25/2016  . Migraines   . Miscarriage    x 2; 4 live births   . Multilevel degenerative disc disease   . Vocal cord dysfunction     Past Surgical History:  Procedure Laterality Date  . ABDOMINAL HYSTERECTOMY     2012 removed cervix h/o abnormal pap   . CHOLECYSTECTOMY     2000  . COLONOSCOPY     2018 with + polpys and GIB 2/2 polyp removal   . ELBOW DEBRIDEMENT    . ESOPHAGOGASTRODUODENOSCOPY (EGD) WITH PROPOFOL N/A 02/06/2018   Procedure: ESOPHAGOGASTRODUODENOSCOPY (EGD) WITH PROPOFOL with biopsies;  Surgeon: Lin Landsman, MD;  Location: [facility];  Service: Endoscopy;  Laterality: N/A;  . FOOT SURGERY      There were no vitals filed for this visit.  Subjective Assessment - 03/26/19 1324    Subjective   I did not wear the brace yesterday at work and MOnday - just a1/2 of the day and was okay - pain did not increaes to more than 2/10 at the end of the day    Pertinent History  Pt injury date July 19th - had wrist splint at home from before and wore that - seen Dr Amedeo Plenty on 01/13/2019  - splint to wear at work and driving - had xray - cont to have pain and some pins and neeldles morstly on radial side of dorsal hand and wrist    Patient Stated Goals  Want the pain and swelling better so I can use my hand like before - so I can do my job , knit , crochet, yardwork ,    Currently in Pain?  No/denies        [facility] OT Assessment - 03/26/19 0001      Strength   Right Hand Grip (lbs)  65    Right Hand Lateral Pinch  17 lbs    Right Hand 3 Point Pinch  19 lbs    Left Hand Grip (lbs)  62    Left Hand Lateral Pinch  17 lbs    Left Hand 3 Point Pinch  17 lbs        AROM in elbow and wrist WNL -  Strength 5/5 in all planes with Graston some tightness proximal flexor forearm  , proximal extensor mass  And mid deltoid felt with graston tool    Had PT done some dry needling -(Wes) - 3 sticks and  good twitching  Pt to cont with  extended elbow- forearm extensors stretch with loose fist In pronation and neutral 5 reps hold 5 sec- done in clinic this date   composite extention stretch nerve glide - out to sidedone on wall- but not to push on wall to strain extensors - slight pull should be on volar forearm and elbow 5 reps  Composite N glide - 5 reps to cont with- done great and less pull And radial N glide reviewwith less of pull 5 reps        OT Treatments/Exercises (OP) - 03/26/19 0001      Cryotherapy   Number Minutes Cryotherapy  4 Minutes    Cryotherapy Location  --   elbow and upper arm    Type of Cryotherapy  Ice pack      Ultrasound   Ultrasound Location  volar wrist     Ultrasound Parameters  3.3MHZ 20% 0.8     Ultrasound Goals  Pain            OT Education - 03/26/19 1533    Education Details  progress and POC    Person(s) Educated  Patient    Methods  Explanation;Demonstration;Tactile cues;Verbal cues;Handout    Comprehension  Verbalized understanding;Returned demonstration       OT Short Term Goals - 03/26/19 1626       OT SHORT TERM GOAL #1   Title  Pain on PRWHE improve with more than 20 points    Baseline  Pain at eval on PRWHE 38/50 improve greatly - pain this date 0/10 coming in and most over week 2/10    Status  Achieved      OT SHORT TERM GOAL #2   Title  Pt to be independent in HEP to decrease pain , increase AROM  to use hand in 50% of ADL's    Status  Achieved        OT Long Term Goals - 03/26/19 1626      OT LONG TERM GOAL #1   Title  Pt L wrist and digits AROM WNL with pain less than 3/10 to use more than 50% in ADL"s    Status  Achieved      OT LONG TERM GOAL #2   Title  Function on PRWHE with more than 20 points    Baseline  at eval function score on PRWHE 33/50- assess next time    Time  4    Period  Weeks    Status  On-going    Target Date  04/23/19            Plan - 03/26/19 1628    Clinical Impression Statement  Pt made great progress since [facility] - great progress in grip and prehension - less pain - done full duty this week and pain end of day 2/10 - cont to have some trigger point in proximal flexor and extensor of foreare and middle deltoid - would recommend 2-3 more session after appt with  MD to follow her as she returns to full duty    OT Occupational Profile and History  Problem Focused Assessment - Including review of records relating to presenting problem    Occupational performance deficits (Please refer to evaluation for details):  ADL's;IADL's;Work;Play;Leisure;Social Participation    Body Structure / Function / Physical Skills  ADL;ROM;UE functional use;Flexibility;Sensation;Edema;Pain;IADL    Rehab Potential  Fair    Clinical Decision Making  Limited treatment options, no task modification necessary  Comorbidities Affecting Occupational Performance:  May have comorbidities impacting occupational performance    Modification or Assistance to Complete Evaluation   No modification of tasks or assist necessary to complete eval    OT Frequency  --   3-4 visits    OT Duration  4 weeks    OT Treatment/Interventions  Self-care/ADL training;Iontophoresis;Patient/family education;Splinting;Paraffin;Fluidtherapy;Contrast Bath;Ultrasound;Manual Therapy;Passive range of motion    Plan  assess progress and assess what to adress with soft tissue and dryneedling as she returns to full duty    OT Home Exercise Plan  see pt instrucition    Consulted and Agree with Plan of Care  Patient       Patient will benefit from skilled therapeutic intervention in order to improve the following deficits and impairments:   Body Structure / Function / Physical Skills: ADL, ROM, UE functional use, Flexibility, Sensation, Edema, Pain, IADL       Visit Diagnosis: Muscle weakness (generalized) - Plan: Ot plan of care cert/re-cert  Pain in left hand - Plan: Ot plan of care cert/re-cert  Stiffness of left wrist, not elsewhere classified - Plan: Ot plan of care cert/re-cert  Localized edema - Plan: Ot plan of care cert/re-cert  Stiffness of left hand, not elsewhere classified - Plan: Ot plan of care cert/re-cert  Pain in left wrist - Plan: Ot plan of care cert/re-cert    Problem List Patient Active Problem List   Diagnosis Date Noted  . Chronic low back pain (Bilateral) w/o sciatica 08/08/2018  . Acute postoperative pain 07/23/2018  . Abnormal MRI, cervical spine (07/10/2018) 07/17/2018  . Cervical spondylitis with radiculitis (HCC) (C6) (Bilateral) (R>L) 06/26/2018  . Chronic sacroiliac joint pain (Bilateral) (R>L) 06/26/2018  . Cervical Grade 1 (2 mm) Retrolisthesis C5 over C6 03/20/2018  . Cervical foraminal stenosis (C3-4 & C5-6) (Bilateral) 03/20/2018  . DDD (degenerative disc disease), cervical 03/20/2018  . Long term prescription benzodiazepine use 03/20/2018  . CKD (chronic kidney disease) stage 3, GFR 30-59 ml/min 03/20/2018  . Acid reflux disease 03/20/2018  . Spondylosis without myelopathy or radiculopathy, cervical region 03/20/2018  . Chronic hip pain  (Bilateral) (L>R) 03/20/2018  . Lumbar facet syndrome (Bilateral) (R>L) 03/20/2018  . Spondylosis without myelopathy or radiculopathy, lumbar region 03/20/2018  . Chronic Sacroiliac joint dysfunction (Bilateral) (R>L) 03/20/2018  . Chronic Somatic dysfunction of sacroiliac joint (Bilateral) (R>L) 03/20/2018  . Osteoarthritis of hip (Bilateral) 03/20/2018  . Cervicogenic headache (Bilateral) 03/20/2018  . Cervicalgia (Bilateral) (R>L) 03/20/2018  . Cervical facet syndrome (Bilateral) (R>L) 03/20/2018  . Chronic occipital neuralgia (Bilateral) 03/20/2018  . Chronic upper extremity pain (Fourth Area of Pain) (Bilateral) (R>L) 03/19/2018  . Ganglion cyst 02/20/2018  . Dyspepsia   . Chronic low back pain (Secondary Area of Pain) (Bilateral) (L>R) w/ sciatica (Bilateral) 01/16/2018  . Chronic lower extremity pain [facility] Area of Pain) (Bilateral) (L>R) 01/16/2018  . Chronic neck pain (Primary Area of Pain) (Bilateral) (midline) 01/16/2018  . Chronic foot pain (Left) 01/16/2018  . Chronic pain syndrome 01/16/2018  . Pharmacologic therapy 01/16/2018  . Disorder of skeletal system 01/16/2018  . Problems influencing health status 01/16/2018  . Obesity 11/21/2017  . Anxiety 09/28/2017  . Chronic knee pain (Right) 09/28/2017  . Fatty liver 07/09/2017  . Colon polyps 07/09/2017  . Fibromyalgia 07/09/2017  . Sinusitis, chronic 07/09/2017  . Allergic rhinitis 07/09/2017  . IBS (irritable bowel syndrome) 07/09/2017  . Chronic GERD 07/09/2017  . Eyelid dermatitis, allergic/contact 07/09/2017  . Hypertension 01/12/2017  . Superficial thrombophlebitis 01/12/2017  .  Rectal bleeding 12/16/2016  . Chronic sacroiliac joint pain (Right) 12/16/2016  . DDD (degenerative disc disease), lumbar 07/18/2016  . Family hx of colon cancer 07/18/2016    Rosalyn Gess OTR/L,CLT 03/26/2019, 4:32 PM  [facility] [address],  [place], [address] Phone: [phone]   Fax:  [phone]  Name: Elizabeth Bowers MRN: [id number] Date of Birth: 16-May-1966

## 2019-03-26 NOTE — Patient Instructions (Signed)
same

## 2019-03-28 ENCOUNTER — Ambulatory Visit: Payer: PRIVATE HEALTH INSURANCE | Admitting: Occupational Therapy

## 2019-03-28 ENCOUNTER — Other Ambulatory Visit: Payer: Self-pay

## 2019-03-28 DIAGNOSIS — M6281 Muscle weakness (generalized): Secondary | ICD-10-CM

## 2019-03-28 DIAGNOSIS — M25532 Pain in left wrist: Secondary | ICD-10-CM

## 2019-03-28 DIAGNOSIS — M25642 Stiffness of left hand, not elsewhere classified: Secondary | ICD-10-CM

## 2019-03-28 DIAGNOSIS — R600 Localized edema: Secondary | ICD-10-CM

## 2019-03-28 DIAGNOSIS — M79642 Pain in left hand: Secondary | ICD-10-CM | POA: Diagnosis not present

## 2019-03-28 DIAGNOSIS — M25632 Stiffness of left wrist, not elsewhere classified: Secondary | ICD-10-CM

## 2019-03-28 NOTE — Therapy (Addendum)
[facility] [address], [place], [address] Phone: [phone]   Fax:  [phone]  Occupational Therapy Treatment  Patient Details  Name: Elizabeth Bowers MRN: [id number] Date of Birth: 1965-06-10 Referring Provider (OT): Gramig   Encounter Date: 03/28/2019  OT End of Session - 03/28/19 1141    Visit Number  17    Number of Visits  20    Date for OT Re-Evaluation  04/23/19    OT Start Time  1034    OT Stop Time  1116    OT Time Calculation (min)  42 min    Activity Tolerance  Patient tolerated treatment well    Behavior During Therapy  [facility] for tasks assessed/performed       Past Medical History:  Diagnosis Date  . Fatty liver   . Fibromyalgia   . GERD (gastroesophageal reflux disease)   . Hemorrhoids   . Hot flashes   . Hypertension   . IBS (irritable bowel syndrome)   . LUQ abdominal pain 10/25/2016  . Migraines   . Miscarriage    x 2; 4 live births   . Multilevel degenerative disc disease   . Vocal cord dysfunction     Past Surgical History:  Procedure Laterality Date  . ABDOMINAL HYSTERECTOMY     2012 removed cervix h/o abnormal pap   . CHOLECYSTECTOMY     2000  . COLONOSCOPY     2018 with + polpys and GIB 2/2 polyp removal   . ELBOW DEBRIDEMENT    . ESOPHAGOGASTRODUODENOSCOPY (EGD) WITH PROPOFOL N/A 02/06/2018   Procedure: ESOPHAGOGASTRODUODENOSCOPY (EGD) WITH PROPOFOL with biopsies;  Surgeon: Lin Landsman, MD;  Location: [facility];  Service: Endoscopy;  Laterality: N/A;  . FOOT SURGERY      There were no vitals filed for this visit.  Subjective Assessment - 03/28/19 1130    Subjective   Did not work since I seen you 2 days ago - doing well -no pain - follow up with Dr Amedeo Plenty Monday    Pertinent History  Pt injury date July 19th - had wrist splint at home from before and wore that - seen Dr Amedeo Plenty on 01/13/2019 - splint to wear at work and driving - had xray - cont to  have pain and some pins and neeldles morstly on radial side of dorsal hand and wrist    Patient Stated Goals  Want the pain and swelling better so I can use my hand like before - so I can do my job , knit , crochet, yardwork ,    Currently in Pain?  No/denies        Strength    Right Hand Grip (lbs)  65    Right Hand Lateral Pinch  17 lbs    Right Hand 3 Point Pinch  19 lbs    Left Hand Grip (lbs)  62    Left Hand Lateral Pinch  17 lbs    Left Hand 3 Point Pinch  17 lbs        AROM in elbow and wrist WNL - with elbow to side and extended arm  Strength 5/5 in all planes with no pain this date with Graston  Tool nr 2 with sweeping and brushing  tightnessproximal  forearm extensor mass, mid and posterior deltoid  Trigger Point Dry Needling -Left forearm wrist extensors, 2-3 inches distal the lateral epicondyle, 1-2 inches medial the bony ulna; 4 sticks, 3 of  which elicit twitch responses at various depths. One stick with residual bleeding, less than 30 seconds and subsequent firm raised bruising beneath. (0.25x42mm) -Left middle deltoid, 2 sticks, 1 without twitch or symptoms.  -Left posterior deltoid just off the posterior edge of the muscle, large twitch responses from entire posterior segment of deltoid rather than isolated taut bands.              (0.25x84mm) *all needling maintained within reasonable tolerance of patient. Pt agreeable to dry needling intervention and is aware of all associated risks. Noted improvement in soft tissue length after dry needling.   Extended elbow- forearm extensors stretch with loose fist In pronation and neutral 5 reps hold 5 sec- done in clinic this date And composite flexors stretch on wall with palm and walk out 5 reps   Prior to nerve glides    Composite N glide - 5 reps - done great and less pull And radial N glide reviewwith less of pull 5 repseach to do at home                   OT Treatments/Exercises  (OP) - 03/28/19 0001      Cryotherapy   Number Minutes Cryotherapy  4 Minutes    Cryotherapy Location  --   upper arm , elbow   Type of Cryotherapy  Ice pack      Ultrasound   Ultrasound Location  volar wrist  - lunate    Ultrasound Parameters  3.3MHZ 20%, 0.8     Ultrasound Goals  Pain      done end of session        OT Education - 03/28/19 1140    Education Details  progress and HEP for nerve glides and stretches    Person(s) Educated  Patient    Methods  Explanation;Demonstration;Tactile cues;Verbal cues;Handout    Comprehension  Verbalized understanding;Returned demonstration       OT Short Term Goals - 03/26/19 1626      OT SHORT TERM GOAL #1   Title  Pain on PRWHE improve with more than 20 points    Baseline  Pain at eval on PRWHE 38/50 improve greatly - pain this date 0/10 coming in and most over week 2/10    Status  Achieved      OT SHORT TERM GOAL #2   Title  Pt to be independent in HEP to decrease pain , increase AROM  to use hand in 50% of ADL's    Status  Achieved        OT Long Term Goals - 03/26/19 1626      OT LONG TERM GOAL #1   Title  Pt L wrist and digits AROM WNL with pain less than 3/10 to use more than 50% in ADL"s    Status  Achieved      OT LONG TERM GOAL #2   Title  Function on PRWHE with more than 20 points    Baseline  at eval function score on PRWHE 33/50- assess next time    Time  4    Period  Weeks    Status  On-going    Target Date  04/23/19            Plan - 03/28/19 1141    Clinical Impression Statement  Pt made great progress from [facility] with pain , tightness and trigger points and limited ROM and strength in L UE - pt do not report any pain with use  at [facility] and strength WNL -  doing this past week full duty it can increase to 2/10 - pt to see MD Monday and would recommend for 2-3 more visits as pt transition into full duty in the next 4 wks    OT Occupational Profile and History  Problem Focused Assessment -  Including review of records relating to presenting problem    Occupational performance deficits (Please refer to evaluation for details):  ADL's;IADL's;Work;Play;Leisure;Social Participation    Body Structure / Function / Physical Skills  ADL;ROM;UE functional use;Flexibility;Sensation;Edema;Pain;IADL    Rehab Potential  Fair    Clinical Decision Making  Limited treatment options, no task modification necessary    Comorbidities Affecting Occupational Performance:  May have comorbidities impacting occupational performance    Modification or Assistance to Complete Evaluation   No modification of tasks or assist necessary to complete eval    OT Frequency  --   3 visits over 4 wks   OT Duration  4 weeks    OT Treatment/Interventions  Self-care/ADL training;Iontophoresis;Patient/family education;Splinting;Paraffin;Fluidtherapy;Contrast Bath;Ultrasound;Manual Therapy;Passive range of motion    Plan  monitor progress and symptoms while returning back to work full duty    [place]  see pt instrucition    Consulted and Agree with Plan of Care  Patient       Patient will benefit from skilled therapeutic intervention in order to improve the following deficits and impairments:   Body Structure / Function / Physical Skills: ADL, ROM, UE functional use, Flexibility, Sensation, Edema, Pain, IADL       Visit Diagnosis: Pain in left hand  Muscle weakness (generalized)  Stiffness of left wrist, not elsewhere classified  Localized edema  Stiffness of left hand, not elsewhere classified  Pain in left wrist    Problem List Patient Active Problem List   Diagnosis Date Noted  . Chronic low back pain (Bilateral) w/o sciatica 08/08/2018  . Acute postoperative pain 07/23/2018  . Abnormal MRI, cervical spine (07/10/2018) 07/17/2018  . Cervical spondylitis with radiculitis (HCC) (C6) (Bilateral) (R>L) 06/26/2018  . Chronic sacroiliac joint pain (Bilateral) (R>L) 06/26/2018  . Cervical  Grade 1 (2 mm) Retrolisthesis C5 over C6 03/20/2018  . Cervical foraminal stenosis (C3-4 & C5-6) (Bilateral) 03/20/2018  . DDD (degenerative disc disease), cervical 03/20/2018  . Long term prescription benzodiazepine use 03/20/2018  . CKD (chronic kidney disease) stage 3, GFR 30-59 ml/min 03/20/2018  . Acid reflux disease 03/20/2018  . Spondylosis without myelopathy or radiculopathy, cervical region 03/20/2018  . Chronic hip pain (Bilateral) (L>R) 03/20/2018  . Lumbar facet syndrome (Bilateral) (R>L) 03/20/2018  . Spondylosis without myelopathy or radiculopathy, lumbar region 03/20/2018  . Chronic Sacroiliac joint dysfunction (Bilateral) (R>L) 03/20/2018  . Chronic Somatic dysfunction of sacroiliac joint (Bilateral) (R>L) 03/20/2018  . Osteoarthritis of hip (Bilateral) 03/20/2018  . Cervicogenic headache (Bilateral) 03/20/2018  . Cervicalgia (Bilateral) (R>L) 03/20/2018  . Cervical facet syndrome (Bilateral) (R>L) 03/20/2018  . Chronic occipital neuralgia (Bilateral) 03/20/2018  . Chronic upper extremity pain (Fourth Area of Pain) (Bilateral) (R>L) 03/19/2018  . Ganglion cyst 02/20/2018  . Dyspepsia   . Chronic low back pain (Secondary Area of Pain) (Bilateral) (L>R) w/ sciatica (Bilateral) 01/16/2018  . Chronic lower extremity pain [facility] Area of Pain) (Bilateral) (L>R) 01/16/2018  . Chronic neck pain (Primary Area of Pain) (Bilateral) (midline) 01/16/2018  . Chronic foot pain (Left) 01/16/2018  . Chronic pain syndrome 01/16/2018  . Pharmacologic therapy 01/16/2018  . Disorder of skeletal system 01/16/2018  . Problems  influencing health status 01/16/2018  . Obesity 11/21/2017  . Anxiety 09/28/2017  . Chronic knee pain (Right) 09/28/2017  . Fatty liver 07/09/2017  . Colon polyps 07/09/2017  . Fibromyalgia 07/09/2017  . Sinusitis, chronic 07/09/2017  . Allergic rhinitis 07/09/2017  . IBS (irritable bowel syndrome) 07/09/2017  . Chronic GERD 07/09/2017  . Eyelid dermatitis,  allergic/contact 07/09/2017  . Hypertension 01/12/2017  . Superficial thrombophlebitis 01/12/2017  . Rectal bleeding 12/16/2016  . Chronic sacroiliac joint pain (Right) 12/16/2016  . DDD (degenerative disc disease), lumbar 07/18/2016  . Family hx of colon cancer 07/18/2016   4:18 PM, 03/28/19 Etta Grandchild, PT, DPT Physical Therapist - [facility]  [phone] ([address])    Rosalyn Gess OTR/L,CLT 03/28/2019, 11:46 AM  [facility] [address], [place], [address] Phone: [phone]   Fax:  [phone]  Name: Alayia Rena MRN: [id number] Date of Birth: 07/15/1965

## 2019-03-28 NOTE — Patient Instructions (Signed)
Same

## 2019-04-19 MED FILL — LINZESS 290 MCG CAPSULE: 290 | 90 days supply | Qty: 90 | Fill #1

## 2019-04-19 MED FILL — LANSOPRAZOLE 30 MG CPDR: 30 | 90 days supply | Qty: 180 | Fill #1

## 2019-04-22 ENCOUNTER — Ambulatory Visit: Payer: PRIVATE HEALTH INSURANCE | Attending: Orthopedic Surgery | Admitting: Occupational Therapy

## 2019-04-22 ENCOUNTER — Other Ambulatory Visit: Payer: Self-pay

## 2019-04-22 DIAGNOSIS — M25632 Stiffness of left wrist, not elsewhere classified: Secondary | ICD-10-CM | POA: Diagnosis present

## 2019-04-22 DIAGNOSIS — M25532 Pain in left wrist: Secondary | ICD-10-CM | POA: Diagnosis present

## 2019-04-22 DIAGNOSIS — M25642 Stiffness of left hand, not elsewhere classified: Secondary | ICD-10-CM

## 2019-04-22 DIAGNOSIS — R600 Localized edema: Secondary | ICD-10-CM

## 2019-04-22 DIAGNOSIS — M79642 Pain in left hand: Secondary | ICD-10-CM

## 2019-04-22 DIAGNOSIS — M6281 Muscle weakness (generalized): Secondary | ICD-10-CM

## 2019-04-22 DIAGNOSIS — I10 Essential (primary) hypertension: Secondary | ICD-10-CM | POA: Insufficient documentation

## 2019-04-22 NOTE — Therapy (Signed)
[facility] [address], [place], [address] Phone: [phone]   Fax:  [phone]  Occupational Therapy discharge session   Patient Details  Name: Elizabeth Bowers MRN: [id number] Date of Birth: 24-Jul-1965 Referring Provider (OT): Gramig   Encounter Date: 04/22/2019  OT End of Session - 04/22/19 1216    Visit Number  18    Number of Visits  18    Date for OT Re-Evaluation  04/22/19    OT Start Time  [id number]    OT Stop Time  1210    OT Time Calculation (min)  25 min       Past Medical History:  Diagnosis Date  . Fatty liver   . Fibromyalgia   . GERD (gastroesophageal reflux disease)   . Hemorrhoids   . Hot flashes   . Hypertension   . IBS (irritable bowel syndrome)   . LUQ abdominal pain 10/25/2016  . Migraines   . Miscarriage    x 2; 4 live births   . Multilevel degenerative disc disease   . Vocal cord dysfunction     Past Surgical History:  Procedure Laterality Date  . ABDOMINAL HYSTERECTOMY     2012 removed cervix h/o abnormal pap   . CHOLECYSTECTOMY     2000  . COLONOSCOPY     2018 with + polpys and GIB 2/2 polyp removal   . ELBOW DEBRIDEMENT    . ESOPHAGOGASTRODUODENOSCOPY (EGD) WITH PROPOFOL N/A 02/06/2018   Procedure: ESOPHAGOGASTRODUODENOSCOPY (EGD) WITH PROPOFOL with biopsies;  Surgeon: Lin Landsman, MD;  Location: [facility];  Service: Endoscopy;  Laterality: N/A;  . FOOT SURGERY      There were no vitals filed for this visit.  Subjective Assessment - 04/22/19 1215    Subjective   Doing very well - I am working full duty and had no pain the last 2 -3 wks    Pertinent History  Pt injury date July 19th - had wrist splint at home from before and wore that - seen Dr Amedeo Plenty on 01/13/2019 - splint to wear at work and driving - had xray - cont to have pain and some pins and neeldles morstly on radial side of dorsal hand and wrist    Patient Stated Goals  Want the pain and  swelling better so I can use my hand like before - so I can do my job , knit , crochet, yardwork ,    Currently in Pain?  No/denies         [facility] OT Assessment - 04/22/19 0001      Strength   Right Hand Grip (lbs)  62    Left Hand Grip (lbs)  55     Wrist and elbow AROM WNL - and no pain  Resistance in all range and planes 5/5 - no pain      with Graston  Tool nr 2 with sweeping and  brushing  tightnessproximal forearm flexor One trigger point  done one dryneedling by Wes PT - pt tolerate well        pt can cont with stretches as needed -   Extended elbow- forearm extensors stretchwith loose fist In pronation and neutral 5 reps hold 5 sec- done in clinic this date And composite flexors stretch on wall with palm and walk out 5 reps   Prior to nerve glides  Daily - nerve glides - just to maintain   Composite N glide - 5  reps - done great and less pull And radial N glide reviewwith less of pull 5 repseach to do at home           OT Education - 04/22/19 1216    Education Details  discharge instructions    Person(s) Educated  Patient    Methods  Explanation;Demonstration;Tactile cues;Verbal cues;Handout    Comprehension  Verbalized understanding;Returned demonstration       OT Short Term Goals - 04/22/19 1218      OT SHORT TERM GOAL #1   Title  Pain on PRWHE improve with more than 20 points    Baseline  Pain at eval on PRWHE 38/50 improve greatly - pain last 3 wks 0/10    Status  Achieved      OT SHORT TERM GOAL #2   Title  Pt to be independent in HEP to decrease pain , increase AROM  to use hand in 50% of ADL's    Status  Achieved        OT Long Term Goals - 04/22/19 1218      OT LONG TERM GOAL #1   Title  Pt L wrist and digits AROM WNL with pain less than 3/10 to use more than 50% in ADL"s    Status  Achieved      OT LONG TERM GOAL #2   Title  Function on PRWHE with more than 20 points    Baseline  at eval function score on PRWHE  33/50- using normally - no pain or issues    Status  Achieved            Plan - 04/22/19 1216    Clinical Impression Statement  Pt made great progress from [facility] - pt last 3 wks full duty at work and had no pain - AROM in bilateral UE WNL , grip and prehension WNL and review with pt to cont with some stretches and nerver glides - but otherwise discharge at this time    OT Occupational Profile and History  Problem Focused Assessment - Including review of records relating to presenting problem    Occupational performance deficits (Please refer to evaluation for details):  ADL's;IADL's;Work;Play;Leisure;Social Participation    Body Structure / Function / Physical Skills  ADL;ROM;UE functional use;Flexibility;Sensation;Edema;Pain;IADL    OT Treatment/Interventions  Self-care/ADL training;Iontophoresis;Patient/family education;Splinting;Paraffin;Fluidtherapy;Contrast Bath;Ultrasound;Manual Therapy;Passive range of motion    Plan  discharge with HEP    Consulted and Agree with Plan of Care  Patient       Patient will benefit from skilled therapeutic intervention in order to improve the following deficits and impairments:   Body Structure / Function / Physical Skills: ADL, ROM, UE functional use, Flexibility, Sensation, Edema, Pain, IADL       Visit Diagnosis: Muscle weakness (generalized)  Stiffness of left wrist, not elsewhere classified  Pain in left hand  Localized edema  Stiffness of left hand, not elsewhere classified  Pain in left wrist    Problem List Patient Active Problem List   Diagnosis Date Noted  . Chronic low back pain (Bilateral) w/o sciatica 08/08/2018  . Acute postoperative pain 07/23/2018  . Abnormal MRI, cervical spine (07/10/2018) 07/17/2018  . Cervical spondylitis with radiculitis (HCC) (C6) (Bilateral) (R>L) 06/26/2018  . Chronic sacroiliac joint pain (Bilateral) (R>L) 06/26/2018  . Cervical Grade 1 (2 mm) Retrolisthesis C5 over C6 03/20/2018  . Cervical  foraminal stenosis (C3-4 & C5-6) (Bilateral) 03/20/2018  . DDD (degenerative disc disease), cervical 03/20/2018  . Long term prescription benzodiazepine  use 03/20/2018  . CKD (chronic kidney disease) stage 3, GFR 30-59 ml/min 03/20/2018  . Acid reflux disease 03/20/2018  . Spondylosis without myelopathy or radiculopathy, cervical region 03/20/2018  . Chronic hip pain (Bilateral) (L>R) 03/20/2018  . Lumbar facet syndrome (Bilateral) (R>L) 03/20/2018  . Spondylosis without myelopathy or radiculopathy, lumbar region 03/20/2018  . Chronic Sacroiliac joint dysfunction (Bilateral) (R>L) 03/20/2018  . Chronic Somatic dysfunction of sacroiliac joint (Bilateral) (R>L) 03/20/2018  . Osteoarthritis of hip (Bilateral) 03/20/2018  . Cervicogenic headache (Bilateral) 03/20/2018  . Cervicalgia (Bilateral) (R>L) 03/20/2018  . Cervical facet syndrome (Bilateral) (R>L) 03/20/2018  . Chronic occipital neuralgia (Bilateral) 03/20/2018  . Chronic upper extremity pain (Fourth Area of Pain) (Bilateral) (R>L) 03/19/2018  . Ganglion cyst 02/20/2018  . Dyspepsia   . Chronic low back pain (Secondary Area of Pain) (Bilateral) (L>R) w/ sciatica (Bilateral) 01/16/2018  . Chronic lower extremity pain [facility] Area of Pain) (Bilateral) (L>R) 01/16/2018  . Chronic neck pain (Primary Area of Pain) (Bilateral) (midline) 01/16/2018  . Chronic foot pain (Left) 01/16/2018  . Chronic pain syndrome 01/16/2018  . Pharmacologic therapy 01/16/2018  . Disorder of skeletal system 01/16/2018  . Problems influencing health status 01/16/2018  . Obesity 11/21/2017  . Anxiety 09/28/2017  . Chronic knee pain (Right) 09/28/2017  . Fatty liver 07/09/2017  . Colon polyps 07/09/2017  . Fibromyalgia 07/09/2017  . Sinusitis, chronic 07/09/2017  . Allergic rhinitis 07/09/2017  . IBS (irritable bowel syndrome) 07/09/2017  . Chronic GERD 07/09/2017  . Eyelid dermatitis, allergic/contact 07/09/2017  . Hypertension 01/12/2017  .  Superficial thrombophlebitis 01/12/2017  . Rectal bleeding 12/16/2016  . Chronic sacroiliac joint pain (Right) 12/16/2016  . DDD (degenerative disc disease), lumbar 07/18/2016  . Family hx of colon cancer 07/18/2016    Rosalyn Gess OTR/L,CLT 04/22/2019, 12:19 PM  [facility] [address] [place], [address] Phone: [phone]   Fax:  [phone]  Name: Elizabeth Bowers MRN: [id number] Date of Birth: May 02, 1966

## 2019-05-15 ENCOUNTER — Other Ambulatory Visit: Payer: Self-pay | Admitting: Family Medicine

## 2019-05-15 DIAGNOSIS — Z1231 Encounter for screening mammogram for malignant neoplasm of breast: Secondary | ICD-10-CM

## 2019-05-23 ENCOUNTER — Ambulatory Visit
Admission: RE | Admit: 2019-05-23 | Discharge: 2019-05-23 | Disposition: A | Discharge status: Home / Self Care | Payer: 59 | Source: Ambulatory Visit | Attending: Family Medicine | Admitting: Family Medicine

## 2019-05-23 ENCOUNTER — Telehealth: Payer: Self-pay

## 2019-05-23 DIAGNOSIS — Z1231 Encounter for screening mammogram for malignant neoplasm of breast: Secondary | ICD-10-CM | POA: Diagnosis not present

## 2019-05-23 NOTE — Telephone Encounter (Signed)
Pt advised.   Thanks,   -Latanya Hemmer  

## 2019-05-23 NOTE — Telephone Encounter (Signed)
-----   Message from Virginia Crews, MD sent at 05/23/2019  3:53 PM EST ----- Normal mammogram. Repeat in 1 yr

## 2019-05-28 ENCOUNTER — Other Ambulatory Visit: Payer: Self-pay | Admitting: Family Medicine

## 2019-05-28 MED FILL — DULOXETINE HCL 60 MG CPEP: 60 | 90 days supply | Qty: 90 | Fill #2

## 2019-05-28 MED FILL — TRIAMTERENE-HCTZ 37.5-25 MG: 37.5-25 | 90 days supply | Qty: 90 | Fill #0

## 2019-06-08 IMAGING — CR DG HIP (WITH OR WITHOUT PELVIS) 2-3V*R*
3 series · 3 of 3 positions shown · non-contrast
Comparison: None.

CLINICAL DATA: Chronic bilateral hip pain.

EXAM:
DG HIP (WITH OR WITHOUT PELVIS) 2-3V RIGHT

[pelvis ap]
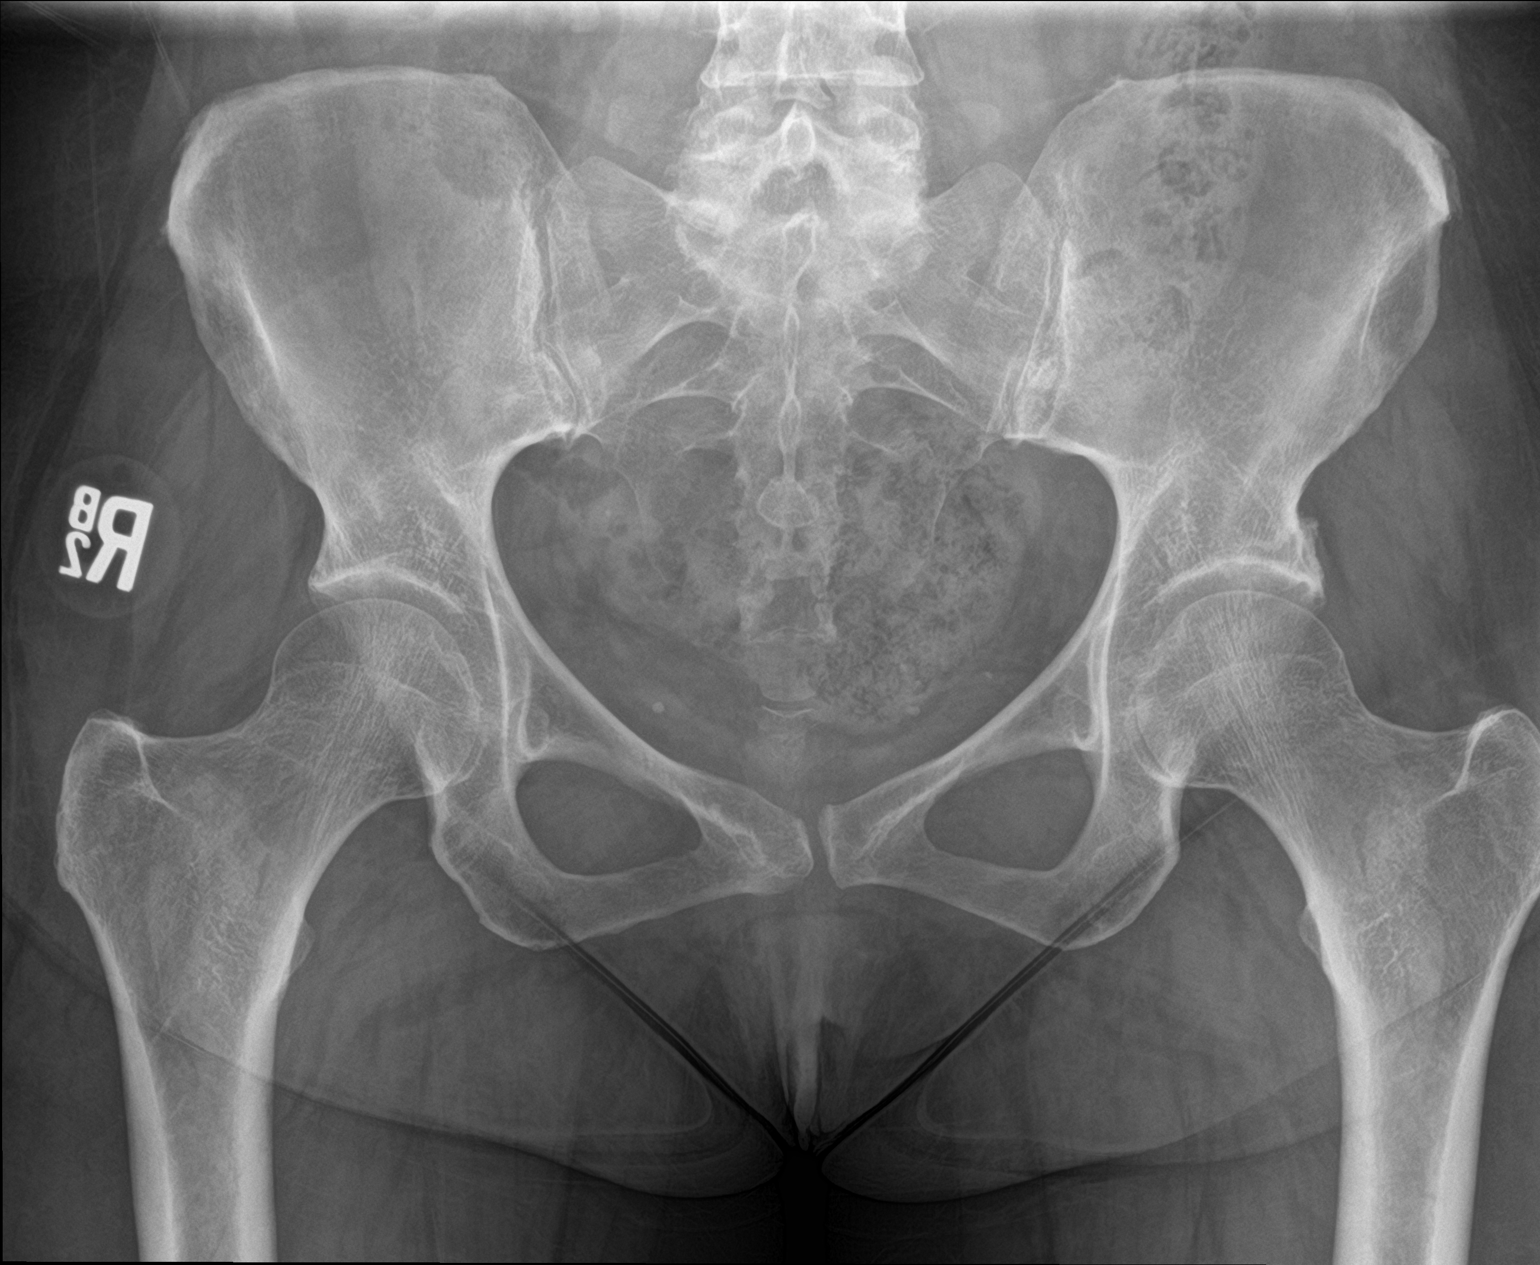

[hip ap]
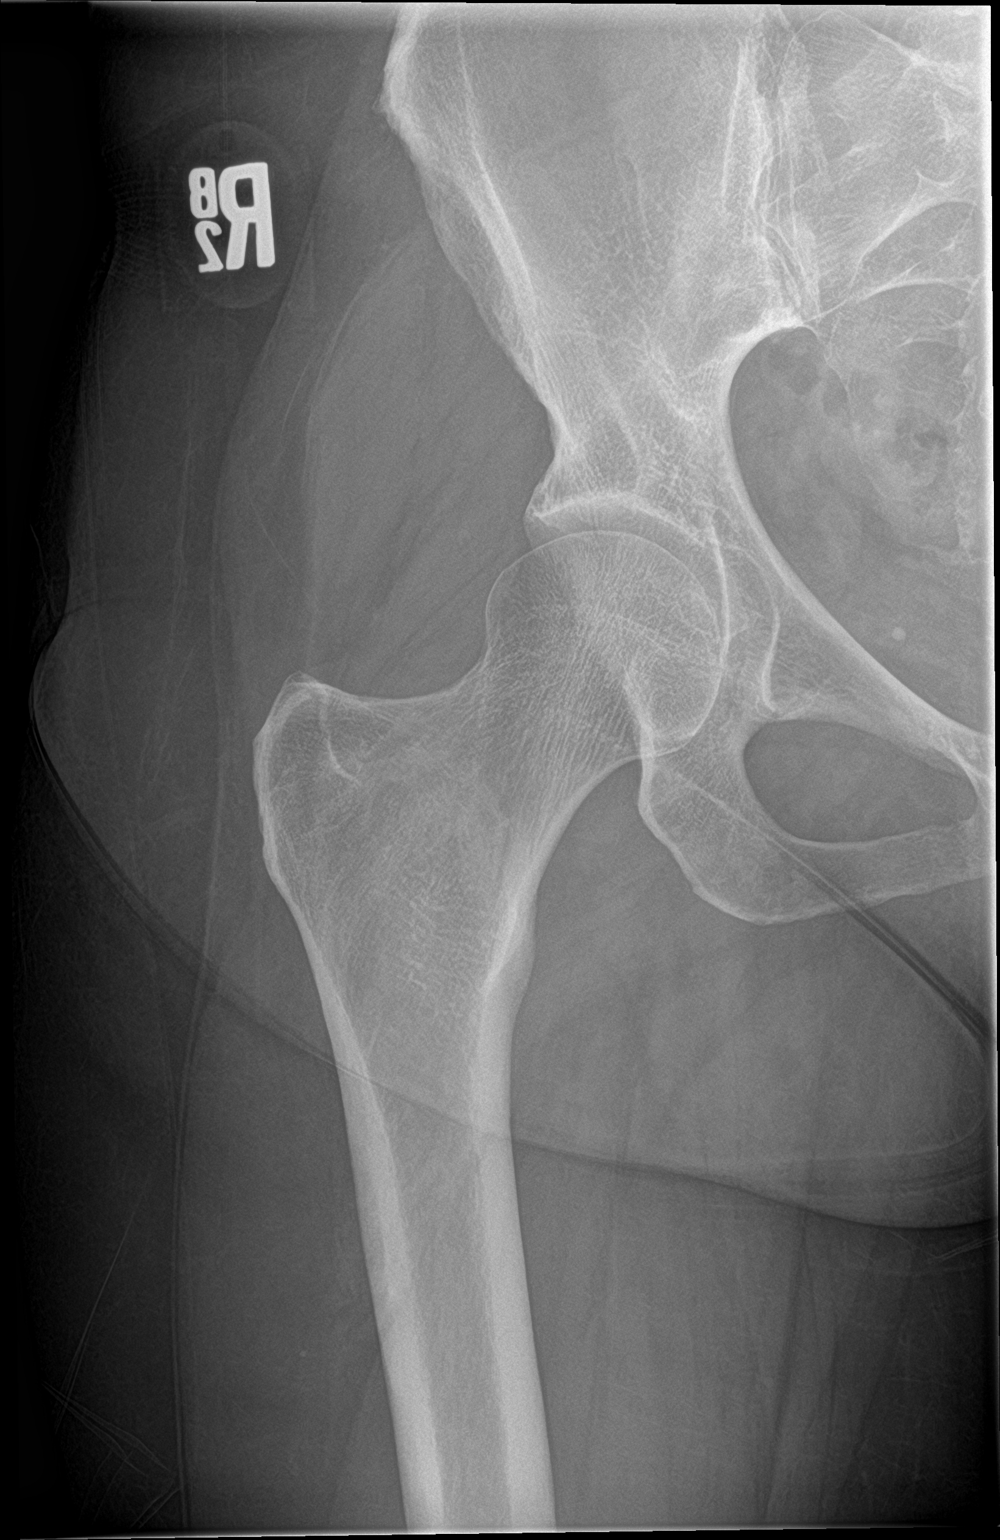

[hip lat]
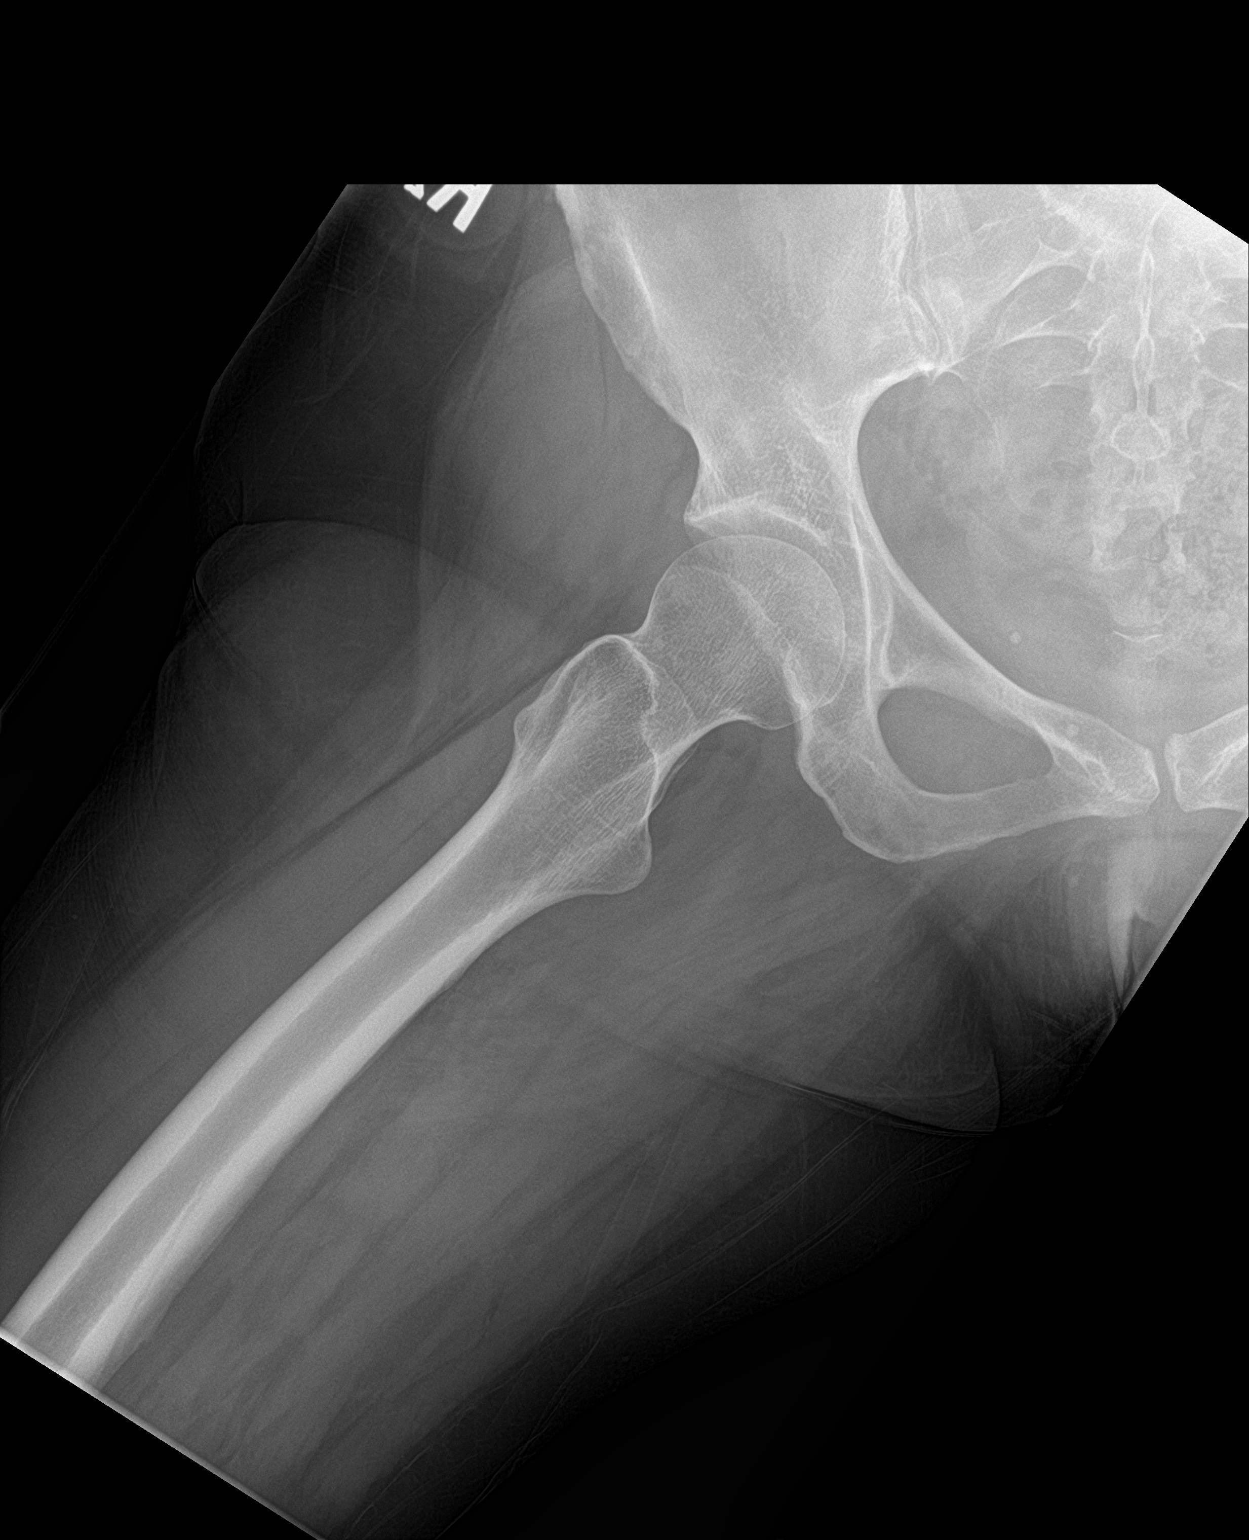

[3 of 3 positions shown; findings below may reference images not displayed]

FINDINGS: There is no evidence of hip fracture or dislocation. There is no
evidence of arthropathy or other focal bone abnormality.
IMPRESSION: Negative.

## 2019-07-07 ENCOUNTER — Encounter: Payer: Self-pay | Admitting: Family Medicine

## 2019-07-07 ENCOUNTER — Telehealth: Payer: Self-pay

## 2019-07-07 ENCOUNTER — Ambulatory Visit (INDEPENDENT_AMBULATORY_CARE_PROVIDER_SITE_OTHER): Payer: 59 | Admitting: Family Medicine

## 2019-07-07 ENCOUNTER — Other Ambulatory Visit: Payer: Self-pay

## 2019-07-07 ENCOUNTER — Other Ambulatory Visit
Admission: RE | Admit: 2019-07-07 | Discharge: 2019-07-07 | Disposition: A | Discharge status: Home / Self Care | Payer: 59 | Source: Ambulatory Visit | Attending: Family Medicine | Admitting: Family Medicine

## 2019-07-07 VITALS — BP 129/87 | HR 67 | Temp 96.8°F | Resp 16 | Ht 62.0 in | Wt 178.6 lb

## 2019-07-07 DIAGNOSIS — G4700 Insomnia, unspecified: Secondary | ICD-10-CM | POA: Diagnosis not present

## 2019-07-07 DIAGNOSIS — Z6832 Body mass index (BMI) 32.0-32.9, adult: Secondary | ICD-10-CM

## 2019-07-07 DIAGNOSIS — F419 Anxiety disorder, unspecified: Secondary | ICD-10-CM

## 2019-07-07 DIAGNOSIS — E669 Obesity, unspecified: Secondary | ICD-10-CM

## 2019-07-07 DIAGNOSIS — I10 Essential (primary) hypertension: Secondary | ICD-10-CM | POA: Insufficient documentation

## 2019-07-07 LAB — BASIC METABOLIC PANEL
Anion gap: 8 (ref 5–15)
BUN: 12 mg/dL (ref 6–20)
CO2: 27 mmol/L (ref 22–32)
Calcium: 9.6 mg/dL (ref 8.9–10.3)
Chloride: 103 mmol/L (ref 98–111)
Creatinine, Ser: 0.91 mg/dL (ref 0.44–1.00)
GFR calc Af Amer: >60 mL/min (ref >60)
GFR calc non Af Amer: >60 mL/min (ref >60)
Glucose, Bld: 87 mg/dL (ref 70–99)
Potassium: 4 mmol/L (ref 3.5–5.1)
Sodium: 138 mmol/L (ref 135–145)

## 2019-07-07 MED ORDER — TRAZODONE HCL 100 MG PO TABS: 50.0000 mg | ORAL_TABLET | Freq: Every day | ORAL | 3 refills | Status: DC

## 2019-07-07 NOTE — Assessment & Plan Note (Signed)
Chronic and stable Some worsening of insomnia Continue cymbalta Trazodone as below

## 2019-07-07 NOTE — Telephone Encounter (Signed)
Pt advised.   Thanks,   -Rourke Mcquitty  

## 2019-07-07 NOTE — Patient Instructions (Signed)

## 2019-07-07 NOTE — Assessment & Plan Note (Signed)
Likely related to stress and anxiety Continue Cymbalta as above We will try to decrease alcohol intake Trial of trazodone nightly as needed

## 2019-07-07 NOTE — Assessment & Plan Note (Signed)
Discussed importance of healthy weight management Discussed diet and exercise  

## 2019-07-07 NOTE — Progress Notes (Signed)
Patient: Elizabeth Bowers Female    DOB: 11/30/1965   54 y.o.   MRN: [id number] Visit Date: 07/07/2019  Today's Provider: Lavon Paganini, MD   Chief Complaint  Patient presents with  . Hypertension   Subjective:    I [place] S. Dimas, CMA, am acting as scribe for Lavon Paganini, MD.   HPI  Hypertension, follow-up:  BP Readings from Last 3 Encounters:  07/07/19 129/87  01/21/19 128/83  01/02/19 118/74    She was last seen for hypertension 6 months ago.  BP at that visit was 118/74. Management changes since that visit include no changes. She reports excellent compliance with treatment. She is not having side effects.  She is exercising. She is adherent to low salt diet.   Outside blood pressures are stable. She is experiencing none.  Patient denies chest pain, irregular heart beat and lower extremity edema.   Cardiovascular risk factors include hypertension and obesity (BMI >= 30 kg/m2).  Use of agents associated with hypertension: none     Weight trend: increasing steadily Wt Readings from Last 3 Encounters:  07/07/19 178 lb 9.6 oz (81 kg)  01/21/19 171 lb 9.6 oz (77.8 kg)  01/02/19 167 lb 12.8 oz (76.1 kg)    Current diet: in general, a "healthy" diet    ------------------------------------------------------------------------ Feels like mood is ok but stressed with work and pandemic.  Having difficulty falling asleep and drinking 1 serving of alcohol per night to help fall asleep.  She has not tried any sleeping medications.  Allergies  Allergen Reactions  . Gluten Meal     Other reaction(s): Abdominal Pain, Myalgias (Muscle Pain), Other (see comments) Other reaction(s): Other (See Comments)   . Lactose     Other reaction(s): Abdominal Pain, Myalgias (Muscle Pain), Other (see comments) Other reaction(s): Other (See Comments)   . Ranitidine Diarrhea and Other (See Comments)    Diarrhea and stomach cramping  . Amphetamine-Dextroamphetamine Other  (See Comments)    Elevated BP  . Lactose Intolerance (Gi)     bloating  . Reglan [Metoclopramide] Other (See Comments)    Increased prolactin level   . Topiramate   . Topiramate Er Other (See Comments)    Severe headache, nausea, vomiting, dizziness, visual disturbance    . Wheat Bran     bloating  . Soap Rash    PROVOLON   . Sulfa Antibiotics Anxiety     Current Outpatient Medications:  .  DULoxetine (CYMBALTA) 60 MG capsule, Take 1 capsule (60 mg total) by mouth daily., Disp: 90 capsule, Rfl: 3 .  lansoprazole (PREVACID) 30 MG capsule, Take 1 capsule (30 mg total) by mouth 2 (two) times daily before a meal., Disp: 180 capsule, Rfl: 3 .  LINZESS 290 MCG CAPS capsule, Take 1 capsule (290 mcg total) by mouth daily., Disp: 90 capsule, Rfl: 3 .  Multiple Vitamin (MULTIVITAMIN WITH MINERALS) TABS tablet, Take 1 tablet by mouth daily., Disp: , Rfl:  .  psyllium (METAMUCIL) 58.6 % powder, Take 2 packets by mouth daily., Disp: , Rfl:  .  triamterene-hydrochlorothiazide (MAXZIDE-25) 37.5-25 MG tablet, TAKE 1 TABLET BY MOUTH DAILY., Disp: 90 tablet, Rfl: 1 .  vitamin C (ASCORBIC ACID) 500 MG tablet, Take 500 mg by mouth daily., Disp: , Rfl:   Review of Systems  Constitutional: Negative.   Respiratory: Negative.   Cardiovascular: Negative.     Social History   Tobacco Use  . Smoking status: Former Smoker    Packs/day: 0.75  Years: 5.00    Pack years: 3.75    Types: Cigarettes    Quit date: 07/06/1987    Years since quitting: 32.0  . Smokeless tobacco: Never Used  Substance Use Topics  . Alcohol use: Yes    Alcohol/week: 2.0 - 4.0 standard drinks    Types: 2 - 4 Cans of beer per week    Comment: wine cooler      Objective:   BP 129/87 (BP Location: Left Arm, Patient Position: Sitting, Cuff Size: Normal)   Pulse 67   Temp (!) 96.8 F (36 C) (Temporal)   Resp 16   Ht 5\' 2"  (1.575 m)   Wt 178 lb 9.6 oz (81 kg)   BMI 32.67 kg/m  Vitals:   07/07/19 1053 07/07/19 1056    BP: (!) 137/91 129/87  Pulse: 67   Resp: 16   Temp: (!) 96.8 F (36 C)   TempSrc: Temporal   Weight: 178 lb 9.6 oz (81 kg)   Height: 5\' 2"  (1.575 m)   Body mass index is 32.67 kg/m.   Physical Exam Vitals reviewed.  Constitutional:      General: She is not in acute distress.    Appearance: Normal appearance. She is well-developed. She is not diaphoretic.  HENT:     Head: Normocephalic and atraumatic.  Eyes:     General: No scleral icterus.    Conjunctiva/sclera: Conjunctivae normal.  Neck:     Thyroid: No thyromegaly.  Cardiovascular:     Rate and Rhythm: Normal rate and regular rhythm.     Pulses: Normal pulses.     Heart sounds: Normal heart sounds. No murmur.  Pulmonary:     Effort: Pulmonary effort is normal. No respiratory distress.     Breath sounds: Normal breath sounds. No wheezing, rhonchi or rales.  Musculoskeletal:     Cervical back: Neck supple.     Right lower leg: No edema.     Left lower leg: No edema.  Lymphadenopathy:     Cervical: No cervical adenopathy.  Skin:    General: Skin is warm and dry.     Capillary Refill: Capillary refill takes less than 2 seconds.     Findings: No rash.  Neurological:     Mental Status: She is alert and oriented to person, place, and time. Mental status is at baseline.  Psychiatric:        Mood and Affect: Mood normal.        Behavior: Behavior normal.      No results found for any visits on 07/07/19.     Assessment & Plan    Problem List Items Addressed This Visit      Cardiovascular and Mediastinum   Hypertension - Primary    Well controlled Continue current medications Recheck metabolic panel F/u in 6 months       Relevant Orders   Basic Metabolic Panel (BMET)     Other   Anxiety    Chronic and stable Some worsening of insomnia Continue cymbalta Trazodone as below      Relevant Medications   traZODone (DESYREL) 100 MG tablet   Obesity    Discussed importance of healthy weight  management Discussed diet and exercise       Insomnia    Likely related to stress and anxiety Continue Cymbalta as above We will try to decrease alcohol intake Trial of trazodone nightly as needed          Return in about 6 months (  around 01/04/2020) for CPE.   The entirety of the information documented in the History of Present Illness, Review of Systems and Physical Exam were personally obtained by me. Portions of this information were initially documented by Lynford Humphrey, CMA and reviewed by me for thoroughness and accuracy.    Lemario Chaikin, [name], MD MPH [facility]

## 2019-07-07 NOTE — Assessment & Plan Note (Signed)
Well controlled Continue current medications Recheck metabolic panel F/u in 6 months  

## 2019-07-07 NOTE — Telephone Encounter (Signed)
-----   Message from Virginia Crews, MD sent at 07/07/2019 12:46 PM EST ----- Normal labs

## 2019-07-08 ENCOUNTER — Encounter: Payer: Self-pay | Admitting: Family Medicine

## 2019-07-16 MED FILL — LINZESS 290 MCG CAPSULE: 290 | 90 days supply | Qty: 90 | Fill #2

## 2019-07-16 MED FILL — LANSOPRAZOLE 30 MG CPDR: 30 | 90 days supply | Qty: 180 | Fill #2

## 2019-08-25 ENCOUNTER — Other Ambulatory Visit: Payer: Self-pay | Admitting: Family Medicine

## 2019-08-25 MED FILL — DULOXETINE HCL 60 MG CPEP: 60 | 90 days supply | Qty: 90 | Fill #0

## 2019-08-25 NOTE — Telephone Encounter (Signed)
Requested medication (s) are due for refill today: Yes  Requested medication (s) are on the active medication list: Yes  Last refill:  08/22/18  Future visit scheduled: Yes  Notes to clinic:  Prescription has expired.    Requested Prescriptions  Pending Prescriptions Disp Refills   DULoxetine (CYMBALTA) 60 MG capsule [Pharmacy Med Name: DULOXETINE HCL 60 MG CPEP 60 Capsule] 90 capsule 2    Sig: TAKE 1 CAPSULE BY MOUTH DAILY.      Psychiatry: Antidepressants - SNRI Passed - 08/25/2019  8:03 AM      Passed - Last BP in normal range    BP Readings from Last 1 Encounters:  07/07/19 129/87          Passed - Valid encounter within last 6 months    Recent Outpatient Visits           1 month ago Essential hypertension   [facility], [name], MD   7 months ago Encounter for annual physical exam   [facility], [name], MD   10 months ago Bilateral carpal tunnel syndrome   [facility] [place], Clearnce Sorrel, [place]   1 year ago [facility] [place], [name], MD   1 year ago Acute non-recurrent maxillary sinusitis   [facility] Car[facility] Collet M, [place]

## 2019-09-04 MED FILL — TRIAMTERENE-HCTZ 37.5-25 MG: 37.5-25 | 90 days supply | Qty: 90 | Fill #1

## 2019-10-07 ENCOUNTER — Telehealth: Payer: Self-pay | Admitting: *Deleted

## 2019-10-07 ENCOUNTER — Other Ambulatory Visit: Payer: Self-pay

## 2019-10-07 DIAGNOSIS — Z86010 Personal history of colonic polyps: Secondary | ICD-10-CM

## 2019-10-07 MED ORDER — NA SULFATE-K SULFATE-MG SULF 17.5-3.13-1.6 GM/177ML PO SOLN: 354.0000 mL | Freq: Once | ORAL | 0 refills | Status: AC

## 2019-10-07 MED FILL — SUPREP BOWEL PREP KIT: 17.5-3.13-1 | 1 days supply | Qty: 354 | Fill #0

## 2019-10-07 NOTE — Telephone Encounter (Signed)
Patient was seen on 01/21/2020 for chronic constipation.  Patient last colonoscopy was 12/14/2016 supposed to repeat in 3 years.  Scheduled patient for 12/22/2019 and sent prep to pharmacy. Mailed and sent to [employer]

## 2019-10-07 NOTE — Telephone Encounter (Signed)
Patient called and wanted to know if she is due for a colonoscopy and if so can she go ahead to get that scheduled. She is a patient of Dr Marius Ditch.

## 2019-11-25 MED FILL — DULOXETINE HCL 60 MG CPEP: 60 | 90 days supply | Qty: 90 | Fill #1

## 2019-12-01 ENCOUNTER — Other Ambulatory Visit: Payer: Self-pay | Admitting: Family Medicine

## 2019-12-01 MED FILL — TRIAMTERENE-HCTZ 37.5-25 MG: 37.5-25 | 90 days supply | Qty: 90 | Fill #0

## 2019-12-04 IMAGING — US US RENAL
1 series · 14 of 25 positions shown · non-contrast
Comparison: None.

CLINICAL DATA: Flank pain.  Stage 3 chronic kidney disease.

EXAM:
RENAL / URINARY TRACT ULTRASOUND COMPLETE

[Series 1: us renal · 0.28mm/px · 14 of 33 slices shown]
[im 1/33]
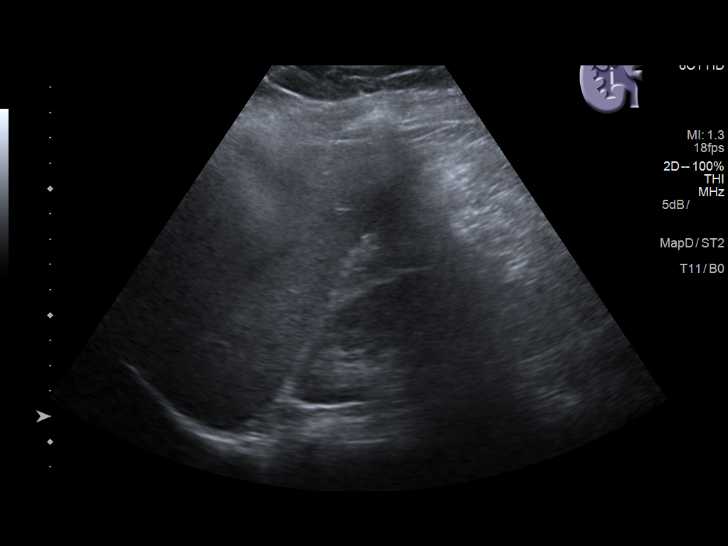
[im 3/33]
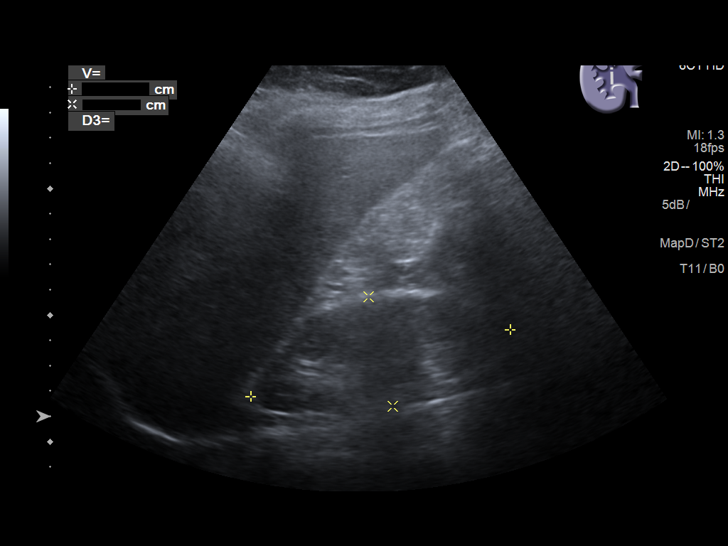
[im 6/33]
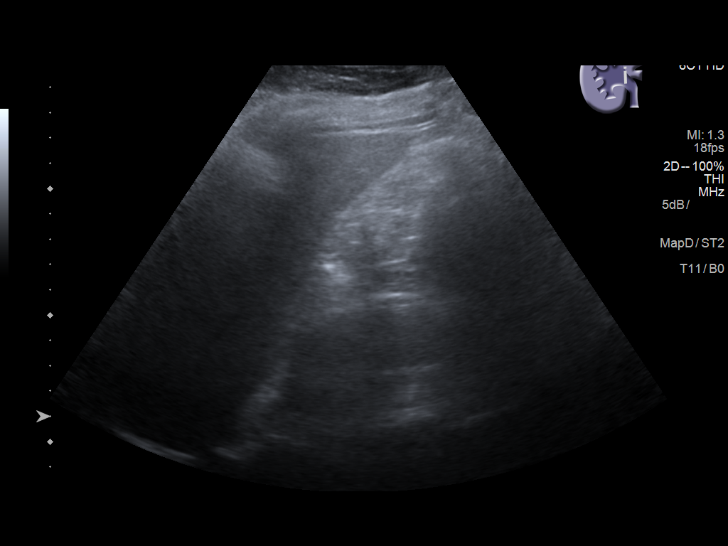
[im 9/33]
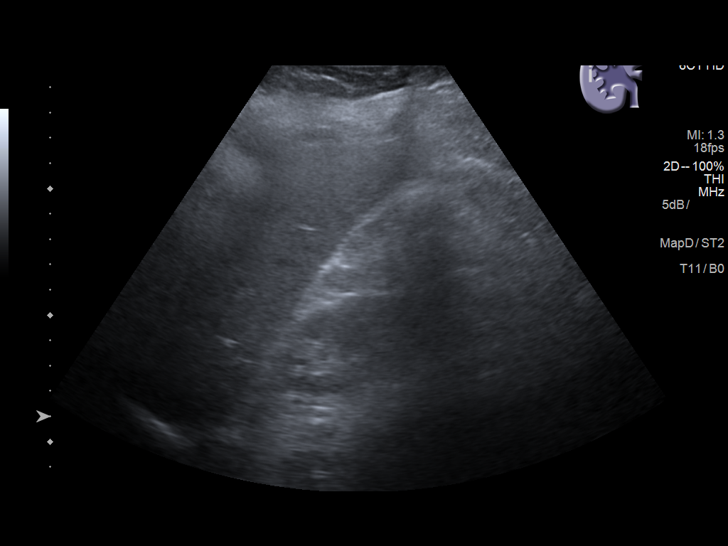
[im 11/33]
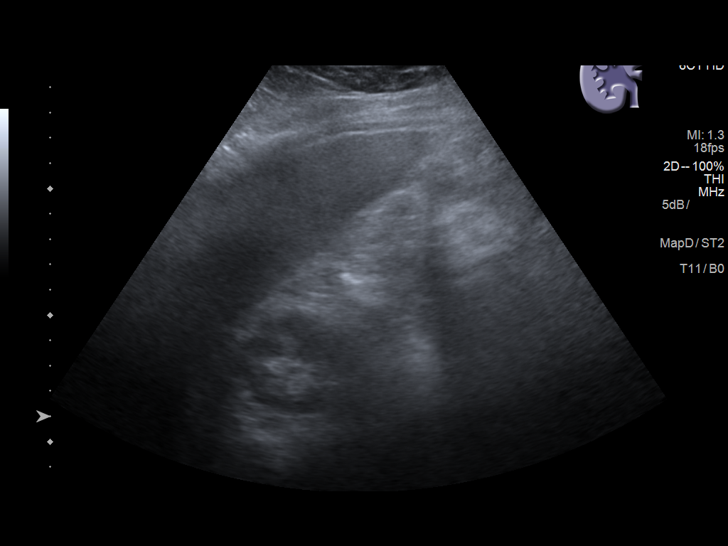
[im 13/33]
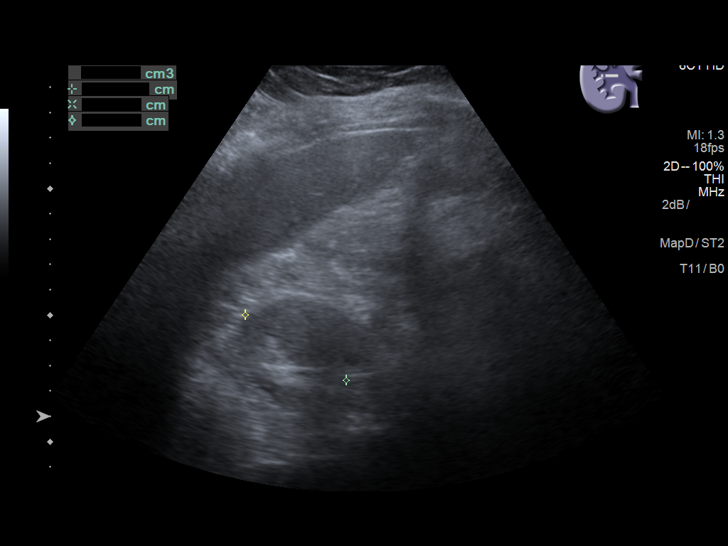
[im 15/33]
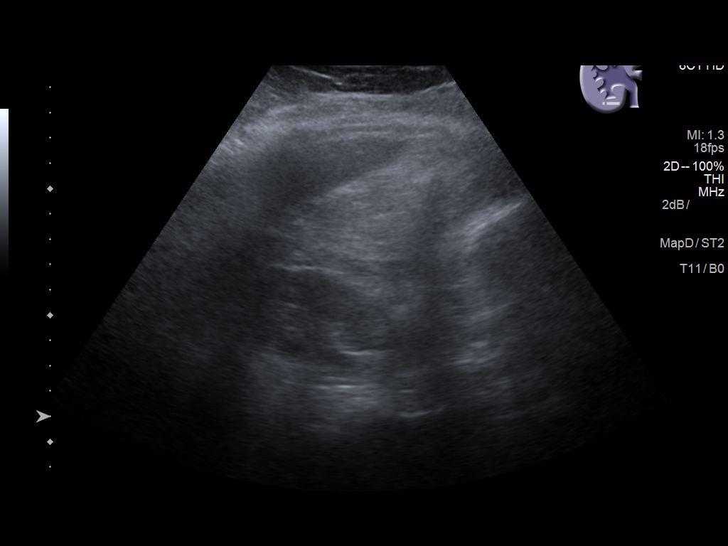
[im 18/33]
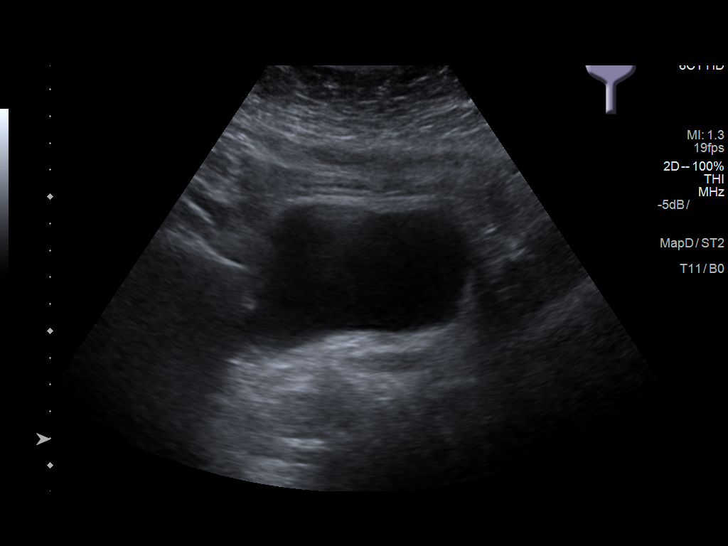
[im 21/33]
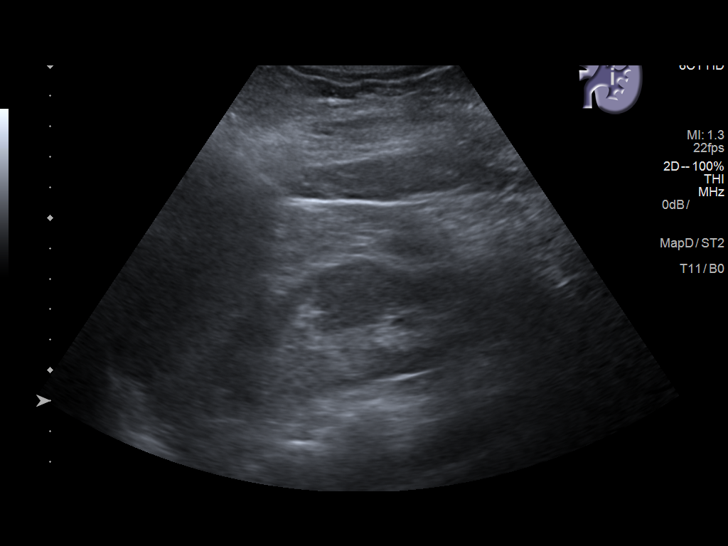
[im 22/33]
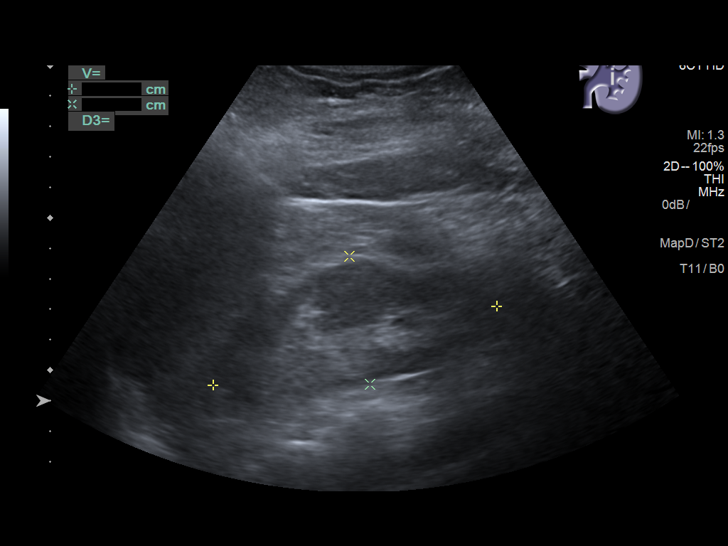
[im 25/33]
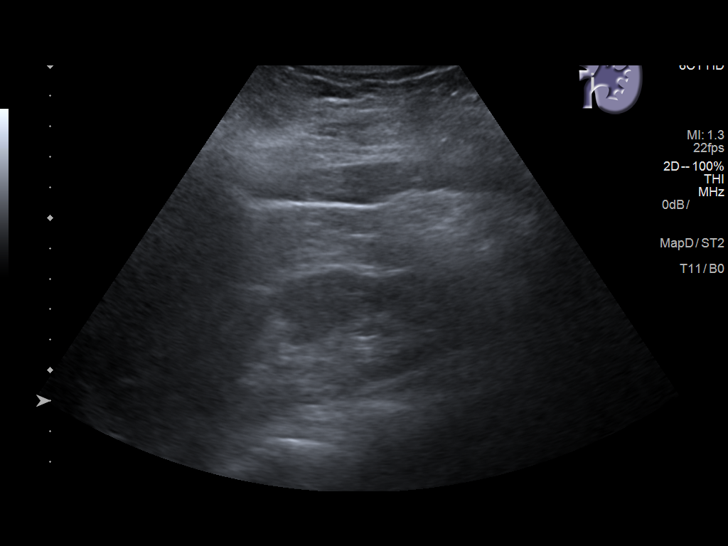
[im 27/33]
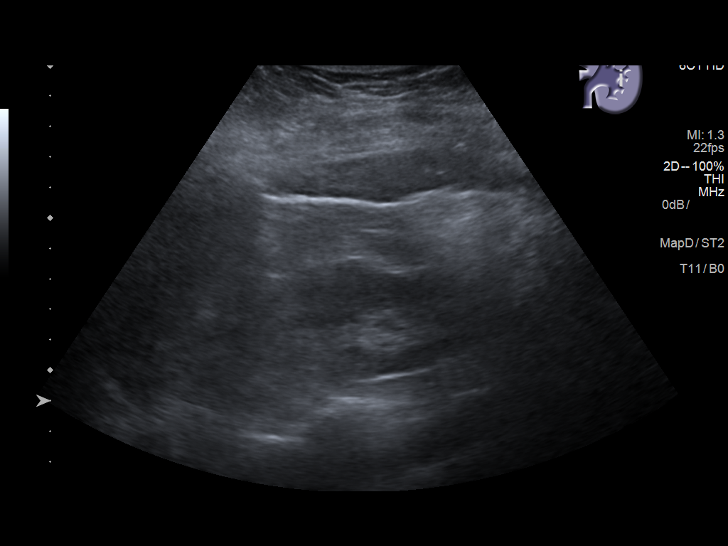
[im 30/33]
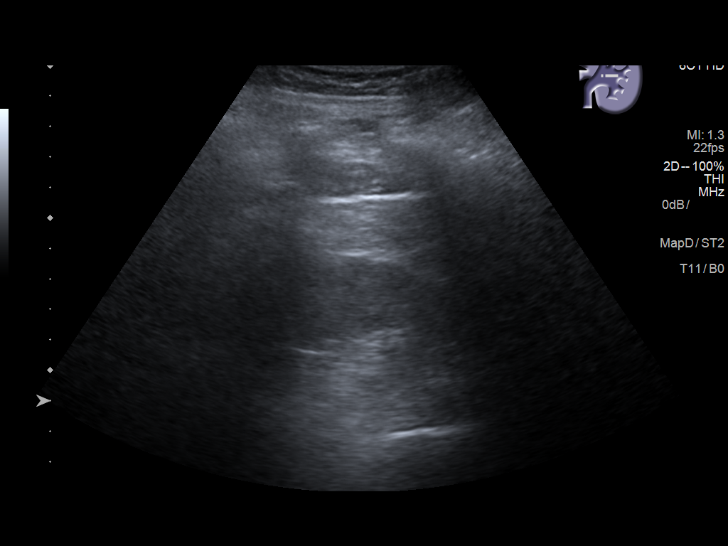
[im 33/33]
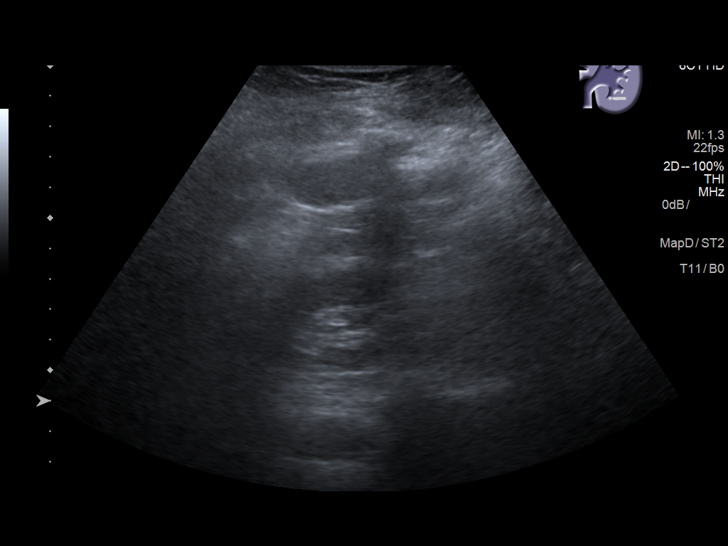

[14 of 25 positions shown; findings below may reference images not displayed]

FINDINGS: Right Kidney:

Renal measurements: 10.6 x 4.7 x 4.4 cm = volume: 117 mL .
Echogenicity within normal limits. No mass or hydronephrosis
visualized.

Left Kidney:

Renal measurements: 9.7 x 4.9 x 4.2 cm = volume: 105 mL.
Echogenicity within normal limits. No mass or hydronephrosis
visualized.

Bladder:

Appears normal for degree of bladder distention.
IMPRESSION: Normal examination.

## 2019-12-09 ENCOUNTER — Telehealth: Payer: Self-pay

## 2019-12-09 ENCOUNTER — Other Ambulatory Visit: Payer: Self-pay

## 2019-12-09 DIAGNOSIS — Z86010 Personal history of colonic polyps: Secondary | ICD-10-CM

## 2019-12-09 MED ORDER — GOLYTELY 236 G PO SOLR: 4000.0000 mL | Freq: Once | ORAL | 0 refills | Status: AC

## 2019-12-09 MED FILL — PEG-3350 AND ELECTROLYTES S: 236 | 2 days supply | Qty: 4000 | Fill #0

## 2019-12-09 NOTE — Telephone Encounter (Signed)
Patient LVM requesting rx for Golytely to be sent to the pharmacy for her.  Rx for Golytely has been sent electronically to [facility] (Pharmacy listed in her chart). Colonoscopy is scheduled with Dr. Marius Ditch on 12/22/19.  Thanks,  [place], [place]

## 2019-12-16 ENCOUNTER — Telehealth: Payer: Self-pay

## 2019-12-16 NOTE — Telephone Encounter (Signed)
Returned patients call regarding colonoscopy instructions not received.  Pt has been advised that her instructions were prepared on 10/07/19 and can be found in her mychart under the lettters tab.  Thanks,  [place], [place]

## 2019-12-18 ENCOUNTER — Other Ambulatory Visit
Admission: RE | Admit: 2019-12-18 | Discharge: 2019-12-18 | Disposition: A | Discharge status: Home / Self Care | Payer: 59 | Source: Ambulatory Visit | Attending: Gastroenterology | Admitting: Gastroenterology

## 2019-12-18 ENCOUNTER — Other Ambulatory Visit: Payer: Self-pay

## 2019-12-18 DIAGNOSIS — Z20822 Contact with and (suspected) exposure to covid-19: Secondary | ICD-10-CM | POA: Insufficient documentation

## 2019-12-18 DIAGNOSIS — Z01812 Encounter for preprocedural laboratory examination: Secondary | ICD-10-CM | POA: Insufficient documentation

## 2019-12-18 LAB — SARS CORONAVIRUS 2 (TAT 6-24 HRS): SARS Coronavirus 2: NEGATIVE

## 2019-12-22 ENCOUNTER — Ambulatory Visit: Payer: 59 | Admitting: Anesthesiology

## 2019-12-22 ENCOUNTER — Other Ambulatory Visit: Payer: Self-pay

## 2019-12-22 ENCOUNTER — Encounter
Admission: RE | Disposition: A | Discharge status: Home / Self Care | Payer: Self-pay | Source: Home / Self Care | Attending: Gastroenterology

## 2019-12-22 ENCOUNTER — Ambulatory Visit
Admission: RE | Admit: 2019-12-22 | Discharge: 2019-12-22 | Disposition: A | Discharge status: Home / Self Care | Payer: 59 | Attending: Gastroenterology | Admitting: Gastroenterology

## 2019-12-22 ENCOUNTER — Encounter: Payer: Self-pay | Admitting: Gastroenterology

## 2019-12-22 DIAGNOSIS — I129 Hypertensive chronic kidney disease with stage 1 through stage 4 chronic kidney disease, or unspecified chronic kidney disease: Secondary | ICD-10-CM | POA: Diagnosis not present

## 2019-12-22 DIAGNOSIS — Z79899 Other long term (current) drug therapy: Secondary | ICD-10-CM | POA: Insufficient documentation

## 2019-12-22 DIAGNOSIS — K589 Irritable bowel syndrome without diarrhea: Secondary | ICD-10-CM | POA: Diagnosis not present

## 2019-12-22 DIAGNOSIS — Z87891 Personal history of nicotine dependence: Secondary | ICD-10-CM | POA: Insufficient documentation

## 2019-12-22 DIAGNOSIS — Z800 Family history of malignant neoplasm of digestive organs: Secondary | ICD-10-CM | POA: Insufficient documentation

## 2019-12-22 DIAGNOSIS — G43909 Migraine, unspecified, not intractable, without status migrainosus: Secondary | ICD-10-CM | POA: Diagnosis not present

## 2019-12-22 DIAGNOSIS — N183 Chronic kidney disease, stage 3 unspecified: Secondary | ICD-10-CM | POA: Diagnosis not present

## 2019-12-22 DIAGNOSIS — Z860101 Personal history of adenomatous and serrated colon polyps: Secondary | ICD-10-CM

## 2019-12-22 DIAGNOSIS — I10 Essential (primary) hypertension: Secondary | ICD-10-CM | POA: Diagnosis not present

## 2019-12-22 DIAGNOSIS — Z86010 Personal history of colonic polyps: Secondary | ICD-10-CM | POA: Insufficient documentation

## 2019-12-22 DIAGNOSIS — Z09 Encounter for follow-up examination after completed treatment for conditions other than malignant neoplasm: Secondary | ICD-10-CM | POA: Insufficient documentation

## 2019-12-22 DIAGNOSIS — M797 Fibromyalgia: Secondary | ICD-10-CM | POA: Insufficient documentation

## 2019-12-22 DIAGNOSIS — K219 Gastro-esophageal reflux disease without esophagitis: Secondary | ICD-10-CM | POA: Diagnosis not present

## 2019-12-22 DIAGNOSIS — Z1211 Encounter for screening for malignant neoplasm of colon: Secondary | ICD-10-CM | POA: Diagnosis not present

## 2019-12-22 DIAGNOSIS — Z538 Procedure and treatment not carried out for other reasons: Secondary | ICD-10-CM | POA: Diagnosis not present

## 2019-12-22 HISTORY — PX: COLONOSCOPY WITH PROPOFOL: SHX5780

## 2019-12-22 SURGERY — COLONOSCOPY WITH PROPOFOL
Anesthesia: General

## 2019-12-22 MED ORDER — NA SULFATE-K SULFATE-MG SULF 17.5-3.13-1.6 GM/177ML PO SOLN: 1.0000 | Freq: Once | ORAL | 0 refills | Status: AC

## 2019-12-22 MED ORDER — PROPOFOL 500 MG/50ML IV EMUL
INTRAVENOUS | Status: AC
  Filled 2019-12-22: qty 50

## 2019-12-22 MED ORDER — SODIUM CHLORIDE 0.9 % IV SOLN: INTRAVENOUS | Status: DC

## 2019-12-22 MED ORDER — LIDOCAINE HCL (PF) 2 % IJ SOLN
INTRAMUSCULAR | Status: AC
  Filled 2019-12-22: qty 5

## 2019-12-22 MED ORDER — PROPOFOL 500 MG/50ML IV EMUL
INTRAVENOUS | Status: DC | PRN
  Administered 2019-12-22: 120 ug/kg/min via INTRAVENOUS

## 2019-12-22 MED ORDER — PROPOFOL 10 MG/ML IV BOLUS
INTRAVENOUS | Status: DC | PRN
  Administered 2019-12-22: 100 mg via INTRAVENOUS

## 2019-12-22 NOTE — Op Note (Signed)
[facility] Gastroenterology Patient Name: Elizabeth Bowers Procedure Date: 12/22/2019 8:05 AM MRN: [id number] Account #: [id number] Date of Birth: 10-Feb-1966 Admit Type: Outpatient Age: 54 Room: [facility] ENDO ROOM 1 Gender: Female Note Status: Finalized Procedure:             Colonoscopy Indications:           High risk colon cancer surveillance: Personal history                         of sessile serrated colon polyp (10 mm or greater in                         size) Providers:             Lin Landsman MD, MD Referring MD:          [name]. Bacigalupo (Referring MD) Medicines:             Monitored Anesthesia Care Complications:         No immediate complications. Estimated blood loss: None. Procedure:             Pre-Anesthesia Assessment:                        - Prior to the procedure, a History and Physical was                         performed, and patient medications and allergies were                         reviewed. The patient is competent. The risks and                         benefits of the procedure and the sedation options and                         risks were discussed with the patient. All questions                         were answered and informed consent was obtained.                         Patient identification and proposed procedure were                         verified by the physician, the nurse, the                         anesthesiologist, the anesthetist and the technician                         in the pre-procedure area in the procedure room in the                         endoscopy suite. Mental Status Examination: alert and                         oriented. Airway Examination: normal oropharyngeal  airway and neck mobility. Respiratory Examination:                         clear to auscultation. CV Examination: normal.                         Prophylactic Antibiotics: The patient does not require                          prophylactic antibiotics. Prior Anticoagulants: The                         patient has taken no previous anticoagulant or                         antiplatelet agents. ASA Grade Assessment: III - A                         patient with severe systemic disease. After reviewing                         the risks and benefits, the patient was deemed in                         satisfactory condition to undergo the procedure. The                         anesthesia plan was to use monitored anesthesia care                         (MAC). Immediately prior to administration of                         medications, the patient was re-assessed for adequacy                         to receive sedatives. The heart rate, respiratory                         rate, oxygen saturations, blood pressure, adequacy of                         pulmonary ventilation, and response to care were                         monitored throughout the procedure. The physical                         status of the patient was re-assessed after the                         procedure.                        After obtaining informed consent, the colonoscope was                         passed under direct vision. Throughout the procedure,  the patient's blood pressure, pulse, and oxygen                         saturations were monitored continuously. The                         Colonoscope was introduced through the anus with the                         intention of advancing to the cecum. The scope was                         advanced to the sigmoid colon before the procedure was                         aborted. Medications were given. The colonoscopy was                         unusually difficult due to inadequate bowel prep.                         Successful completion of the procedure was aided by                         aborted procedure. The patient tolerated the procedure                          well. The quality of the bowel preparation was poor.                         The colonoscopy was aborted due to the difficulty of                         the procedure. Findings:      Poor prep, procedure aborted      The perianal and digital rectal examinations were normal. Pertinent       negatives include normal sphincter tone and no palpable rectal lesions.      Copious quantities of semi-liquid stool was found in the rectum and in       the sigmoid colon, precluding visualization. Impression:            - Preparation of the colon was poor.                        - The procedure was aborted due to the difficulty of                         the procedure.                        - Stool in the rectum and in the sigmoid colon.                        - No specimens collected. Recommendation:        - Discharge patient to home (with escort).                        - Clear liquid diet  today for possible colonoscopy                         tomorrow.                        - Continue present medications.                        - Repeat colonoscopy tomorrow because the bowel                         preparation was poor with repeat prep if patient is                         agreeable. Procedure Code(s):     --- Professional ---                        V6160, 53, Colorectal cancer screening; colonoscopy on                         individual at high risk Diagnosis Code(s):     --- Professional ---                        Z86.010, Personal history of colonic polyps CPT copyright 2019 American Medical Association. All rights reserved. The codes documented in this report are preliminary and upon coder review may  be revised to meet current compliance requirements. Dr. Ulyess Mort Lin Landsman MD, MD 12/22/2019 8:26:41 AM This report has been signed electronically. Number of Addenda: 0 Note Initiated On: 12/22/2019 8:05 AM Total Procedure Duration: 0 hours 1 minute 58 seconds  Estimated  Blood Loss:  Estimated blood loss: none.      [facility]

## 2019-12-22 NOTE — H&P (Signed)
Elizabeth Darby, MD [address]  [place]  [place], [place] [address]  Main: [phone]  Fax: [phone] Pager: [phone]  Primary Care Physician:  Virginia Crews, MD Primary Gastroenterologist:  Dr. Cephas Bowers  Pre-Procedure History & Physical: HPI:  Elizabeth Bowers is a 54 y.o. female is here for an colonoscopy.   Past Medical History:  Diagnosis Date   Fatty liver    Fibromyalgia    GERD (gastroesophageal reflux disease)    Hemorrhoids    Hot flashes    Hypertension    IBS (irritable bowel syndrome)    LUQ abdominal pain 10/25/2016   Migraines    Miscarriage    x 2; 4 live births    Multilevel degenerative disc disease    Vocal cord dysfunction     Past Surgical History:  Procedure Laterality Date   ABDOMINAL HYSTERECTOMY     2012 removed cervix h/o abnormal pap    CHOLECYSTECTOMY     2000   COLONOSCOPY     2018 with + polpys and GIB 2/2 polyp removal    ELBOW DEBRIDEMENT     ESOPHAGOGASTRODUODENOSCOPY (EGD) WITH PROPOFOL N/A 02/06/2018   Procedure: ESOPHAGOGASTRODUODENOSCOPY (EGD) WITH PROPOFOL with biopsies;  Surgeon: Lin Landsman, MD;  Location: [place];  Service: Endoscopy;  Laterality: N/A;   FOOT SURGERY      Prior to Admission medications   Medication Sig Start Date End Date Taking? Authorizing Provider  DULoxetine (CYMBALTA) 60 MG capsule TAKE 1 CAPSULE BY MOUTH DAILY. 08/25/19  Yes Bacigalupo, [name], MD  Multiple Vitamin (MULTIVITAMIN WITH MINERALS) TABS tablet Take 1 tablet by mouth daily.   Yes [provider]  psyllium (METAMUCIL) 58.6 % powder Take 2 packets by mouth daily.   Yes [provider]  SUMAtriptan (IMITREX) 100 MG tablet Take 100 mg by mouth every 2 (two) hours as needed for migraine. May repeat in 2 hours if headache persists or recurs.   Yes [provider]  triamterene-hydrochlorothiazide (MAXZIDE-25) 37.5-25 MG tablet TAKE 1 TABLET BY MOUTH ONCE  DAILY 12/01/19  Yes Bacigalupo, [name], MD  vitamin C (ASCORBIC ACID) 500 MG tablet Take 500 mg by mouth daily.   Yes [provider]  lansoprazole (PREVACID) 30 MG capsule Take 1 capsule (30 mg total) by mouth 2 (two) times daily before a meal. 01/21/19 07/07/19  Altan Kraai, Tally Due, MD  LINZESS 290 MCG CAPS capsule Take 1 capsule (290 mcg total) by mouth daily. 01/21/19 07/07/19  Lin Landsman, MD  traZODone (DESYREL) 100 MG tablet Take 0.5-1 tablets (50-100 mg total) by mouth at bedtime. Patient not taking: Reported on 12/22/2019 07/07/19   Virginia Crews, MD    Allergies as of 10/07/2019 - Review Complete 07/07/2019  Allergen Reaction Noted   Gluten meal  04/22/2019   Lactose  04/22/2019   Ranitidine Diarrhea and Other (See Comments) 08/30/2016   Amphetamine-dextroamphetamine Other (See Comments) 05/10/2016   Lactose intolerance (gi)  02/05/2018   Reglan [metoclopramide] Other (See Comments) 12/15/2016   Topiramate  01/21/2019   Topiramate er Other (See Comments) 01/16/2018   Wheat bran  02/05/2018   Soap Rash 02/20/2017   Sulfa antibiotics Anxiety 12/15/2016    Family History  Problem Relation Age of Onset   Hypertension Mother    Hyperlipidemia Mother    Glaucoma Mother    Diabetes Mother        type 2    COPD Father  emphysema   Alcohol abuse Father    Colon cancer Sister    Cancer Sister        rectal/colon cancer no h/o IBD   Colon polyps Sister    Asthma Sister    COPD Sister    Kidney disease Sister    Mental illness Sister        bipolar    Miscarriages / [place] Sister    Uterine cancer Sister    Alcohol abuse Son    Depression Son    Diabetes Son    Drug abuse Son    Mental illness Son        bipolar    Diabetes Maternal Grandmother    COPD Paternal Grandmother    Cancer Paternal Grandfather        FH colon cancer maternal aunts/uncles    Miscarriages / Stillbirths Sister    Arthritis Son      Arthritis Son    Breast cancer Neg Hx     Social History   Socioeconomic History   Marital status: Married    Spouse name: Kyung Rudd   Number of children: 6   Years of education: 14   Highest education level: Associate degree: academic program  Occupational History   Occupation: Microbiologist: [employer]  Tobacco Use   Smoking status: Former Smoker    Packs/day: 0.75    Years: 5.00    Pack years: 3.75    Types: Cigarettes    Quit date: 07/06/1987    Years since quitting: 32.4   Smokeless tobacco: Never Used  Vaping Use   Vaping Use: Never used  Substance and Sexual Activity   Alcohol use: Yes    Alcohol/week: 2.0 - 4.0 standard drinks    Types: 2 - 4 Cans of beer per week    Comment: wine cooler   Drug use: Never   Sexual activity: Not on file  Other Topics Concern   Not on file  Social History Narrative   Not on file   Social Determinants of Health   Financial Resource Strain:    Difficulty of Paying Living Expenses:   Food Insecurity:    Worried About Charity fundraiser in the Last Year:    Arboriculturist in the Last Year:   Transportation Needs:    Film/video editor (Medical):    Lack of Transportation (Non-Medical):   Physical Activity:    Days of Exercise per Week:    Minutes of Exercise per Session:   Stress:    Feeling of Stress :   Social Connections:    Frequency of Communication with Friends and Family:    Frequency of Social Gatherings with Friends and Family:    Attends Religious Services:    Active Member of Clubs or Organizations:    Attends Music therapist:    Marital Status:   Intimate Partner Violence:    Fear of Current or Ex-Partner:    Emotionally Abused:    Physically Abused:    Sexually Abused:     Review of Systems: See HPI, otherwise negative ROS  Physical Exam: BP (!) 146/93    Pulse 66    Temp 97.9 F (36.6 C) (Temporal)    Resp 17    Ht 5\' 2"  (1.575 m)     Wt 81.2 kg    SpO2 98%    BMI 32.74 kg/m  General:   Alert,  pleasant and cooperative in NAD Head:  Normocephalic and atraumatic. Neck:  Supple; no masses or thyromegaly. Lungs:  Clear throughout to auscultation.    Heart:  Regular rate and rhythm. Abdomen:  Soft, nontender and nondistended. Normal bowel sounds, without guarding, and without rebound.   Neurologic:  Alert and  oriented x4;  grossly normal neurologically.  Impression/Plan: [place] is here for an colonoscopy to be performed for Colon cancer in first-degree relative < 64 years of age - Last colonoscopy in 12/2016 revealed sessile serrated adenoma in the cecum which was 1 cm in size  Risks, benefits, limitations, and alternatives regarding  colonoscopy have been reviewed with the patient.  Questions have been answered.  All parties agreeable.   Sherri Sear, MD  12/22/2019, 8:16 AM

## 2019-12-22 NOTE — Transfer of Care (Signed)
Immediate Anesthesia Transfer of Care Note  Patient: Elizabeth Bowers  Procedure(s) Performed: COLONOSCOPY WITH PROPOFOL (N/A )  Patient Location: Endoscopy Unit  Anesthesia Type:General  Level of Consciousness: drowsy and patient cooperative  Airway & Oxygen Therapy: Patient Spontanous Breathing  Post-op Assessment: Report given to RN and Post -op Vital signs reviewed and stable  Post vital signs: Reviewed and stable  Last Vitals:  Vitals Value Taken Time  BP 140/70 12/22/19 0832  Temp 36.2 C 12/22/19 0832  Pulse 71 12/22/19 0832  Resp 13 12/22/19 0832  SpO2 97 % 12/22/19 0832  Vitals shown include unvalidated device data.  Last Pain:  Vitals:   12/22/19 0832  TempSrc: Tympanic  PainSc: 0-No pain         Complications: No complications documented.

## 2019-12-22 NOTE — Anesthesia Preprocedure Evaluation (Signed)
Anesthesia Evaluation  Patient identified by MRN, date of birth, ID band Patient awake    Reviewed: Allergy & Precautions, H&P , NPO status , Patient's Chart, lab work & pertinent test results, reviewed documented beta blocker date and time   History of Anesthesia Complications Negative for: history of anesthetic complications  Airway Mallampati: II  TM Distance: <3 FB Neck ROM: limited    Dental  (+) Chipped   Pulmonary neg pulmonary ROS, neg shortness of breath, former smoker,    Pulmonary exam normal        Cardiovascular Exercise Tolerance: Good hypertension, (-) angina(-) CAD Normal cardiovascular exam     Neuro/Psych  Headaches, PSYCHIATRIC DISORDERS  Neuromuscular disease negative psych ROS   GI/Hepatic Neg liver ROS, GERD  Medicated and Controlled,  Endo/Other  negative endocrine ROS  Renal/GU Renal disease  negative genitourinary   Musculoskeletal  (+) Arthritis , Fibromyalgia -  Abdominal   Peds  Hematology negative hematology ROS (+)   Anesthesia Other Findings Past Medical History: No date: Fatty liver No date: Fibromyalgia No date: GERD (gastroesophageal reflux disease) No date: Hemorrhoids No date: Hot flashes No date: Hypertension No date: IBS (irritable bowel syndrome) 10/25/2016: LUQ abdominal pain No date: Migraines No date: Miscarriage     Comment:  x 2; 4 live births  No date: Multilevel degenerative disc disease No date: Vocal cord dysfunction   Reproductive/Obstetrics negative OB ROS                             Anesthesia Physical  Anesthesia Plan  ASA: III  Anesthesia Plan: General   Post-op Pain Management:    Induction: Intravenous  PONV Risk Score and Plan: Propofol infusion and TIVA  Airway Management Planned: Natural Airway and Nasal Cannula  Additional Equipment:   Intra-op Plan:   Post-operative Plan:   Informed Consent: I have  reviewed the patients History and Physical, chart, labs and discussed the procedure including the risks, benefits and alternatives for the proposed anesthesia with the patient or authorized representative who has indicated his/her understanding and acceptance.     Dental Advisory Given  Plan Discussed with: CRNA  Anesthesia Plan Comments: (Patient consented for risks of anesthesia including but not limited to:  - adverse reactions to medications - risk of intubation if required - damage to eyes, teeth, lips or other oral mucosa - nerve damage due to positioning  - sore throat or hoarseness - Damage to heart, brain, nerves, lungs, other parts of body or loss of life  Patient voiced understanding.)        Anesthesia Quick Evaluation

## 2019-12-22 NOTE — Anesthesia Postprocedure Evaluation (Signed)
Anesthesia Post Note  Patient: Elizabeth Bowers  Procedure(s) Performed: COLONOSCOPY WITH PROPOFOL (N/A )  Patient location during evaluation: Endoscopy Anesthesia Type: General Level of consciousness: awake and alert Pain management: pain level controlled Vital Signs Assessment: post-procedure vital signs reviewed and stable Respiratory status: spontaneous breathing, nonlabored ventilation, respiratory function stable and patient connected to nasal cannula oxygen Cardiovascular status: blood pressure returned to baseline and stable Postop Assessment: no apparent nausea or vomiting Anesthetic complications: no   No complications documented.   Last Vitals:  Vitals:   12/22/19 0842 12/22/19 0852  BP: 129/77 136/88  Pulse: 66 69  Resp: 19 13  Temp:    SpO2: 99% 100%    Last Pain:  Vitals:   12/22/19 0852  TempSrc:   PainSc: 0-No pain                 Precious Haws Nael Petrosyan

## 2019-12-23 ENCOUNTER — Encounter
Admission: RE | Disposition: A | Discharge status: Home / Self Care | Payer: Self-pay | Source: Home / Self Care | Attending: Gastroenterology

## 2019-12-23 ENCOUNTER — Encounter: Payer: Self-pay | Admitting: Gastroenterology

## 2019-12-23 ENCOUNTER — Ambulatory Visit: Payer: 59 | Admitting: Anesthesiology

## 2019-12-23 ENCOUNTER — Other Ambulatory Visit: Payer: Self-pay

## 2019-12-23 ENCOUNTER — Ambulatory Visit
Admission: RE | Admit: 2019-12-23 | Discharge: 2019-12-23 | Disposition: A | Discharge status: Home / Self Care | Payer: 59 | Attending: Gastroenterology | Admitting: Gastroenterology

## 2019-12-23 DIAGNOSIS — F419 Anxiety disorder, unspecified: Secondary | ICD-10-CM | POA: Diagnosis not present

## 2019-12-23 DIAGNOSIS — K760 Fatty (change of) liver, not elsewhere classified: Secondary | ICD-10-CM | POA: Insufficient documentation

## 2019-12-23 DIAGNOSIS — Z87891 Personal history of nicotine dependence: Secondary | ICD-10-CM | POA: Insufficient documentation

## 2019-12-23 DIAGNOSIS — Z86010 Personal history of colonic polyps: Secondary | ICD-10-CM

## 2019-12-23 DIAGNOSIS — K219 Gastro-esophageal reflux disease without esophagitis: Secondary | ICD-10-CM | POA: Diagnosis not present

## 2019-12-23 DIAGNOSIS — R519 Headache, unspecified: Secondary | ICD-10-CM | POA: Diagnosis not present

## 2019-12-23 DIAGNOSIS — M797 Fibromyalgia: Secondary | ICD-10-CM | POA: Insufficient documentation

## 2019-12-23 DIAGNOSIS — Z79899 Other long term (current) drug therapy: Secondary | ICD-10-CM | POA: Insufficient documentation

## 2019-12-23 DIAGNOSIS — M1990 Unspecified osteoarthritis, unspecified site: Secondary | ICD-10-CM | POA: Insufficient documentation

## 2019-12-23 DIAGNOSIS — Z1211 Encounter for screening for malignant neoplasm of colon: Secondary | ICD-10-CM

## 2019-12-23 DIAGNOSIS — I10 Essential (primary) hypertension: Secondary | ICD-10-CM | POA: Diagnosis not present

## 2019-12-23 DIAGNOSIS — K635 Polyp of colon: Secondary | ICD-10-CM | POA: Diagnosis not present

## 2019-12-23 DIAGNOSIS — D120 Benign neoplasm of cecum: Secondary | ICD-10-CM | POA: Diagnosis not present

## 2019-12-23 HISTORY — PX: COLONOSCOPY WITH PROPOFOL: SHX5780

## 2019-12-23 SURGERY — COLONOSCOPY WITH PROPOFOL
Anesthesia: General

## 2019-12-23 MED ORDER — PROPOFOL 500 MG/50ML IV EMUL
INTRAVENOUS | Status: DC | PRN
  Administered 2019-12-23: 147 ug/kg/min via INTRAVENOUS

## 2019-12-23 MED ORDER — SODIUM CHLORIDE 0.9 % IV SOLN: INTRAVENOUS | Status: DC

## 2019-12-23 MED ORDER — PROPOFOL 500 MG/50ML IV EMUL
INTRAVENOUS | Status: AC
  Filled 2019-12-23: qty 50

## 2019-12-23 MED ORDER — PROPOFOL 10 MG/ML IV BOLUS
INTRAVENOUS | Status: AC
  Filled 2019-12-23: qty 20

## 2019-12-23 MED ORDER — LIDOCAINE HCL (PF) 2 % IJ SOLN
INTRAMUSCULAR | Status: AC
  Filled 2019-12-23: qty 5

## 2019-12-23 MED ORDER — LIDOCAINE HCL (CARDIAC) PF 100 MG/5ML IV SOSY
PREFILLED_SYRINGE | INTRAVENOUS | Status: DC | PRN
  Administered 2019-12-23: 100 mg via INTRAVENOUS

## 2019-12-23 NOTE — H&P (Signed)
Cephas Darby, MD [address]  [place]  [place], [place] [address]  Main: [phone]  Fax: [phone] Pager: [phone]  Primary Care Physician:  Virginia Crews, MD Primary Gastroenterologist:  Dr. Cephas Darby  Pre-Procedure History & Physical: HPI:  Elizabeth Bowers is a 54 y.o. female is here for an colonoscopy.   Past Medical History:  Diagnosis Date  . Fatty liver   . Fibromyalgia   . GERD (gastroesophageal reflux disease)   . Hemorrhoids   . Hot flashes   . Hypertension   . IBS (irritable bowel syndrome)   . LUQ abdominal pain 10/25/2016  . Migraines   . Miscarriage    x 2; 4 live births   . Multilevel degenerative disc disease   . Vocal cord dysfunction     Past Surgical History:  Procedure Laterality Date  . ABDOMINAL HYSTERECTOMY     2012 removed cervix h/o abnormal pap   . CHOLECYSTECTOMY     2000  . COLONOSCOPY     2018 with + polpys and GIB 2/2 polyp removal   . COLONOSCOPY WITH PROPOFOL N/A 12/22/2019   Procedure: COLONOSCOPY WITH PROPOFOL;  Surgeon: Lin Landsman, MD;  Location: [place];  Service: Gastroenterology;  Laterality: N/A;  . ELBOW DEBRIDEMENT    . ESOPHAGOGASTRODUODENOSCOPY (EGD) WITH PROPOFOL N/A 02/06/2018   Procedure: ESOPHAGOGASTRODUODENOSCOPY (EGD) WITH PROPOFOL with biopsies;  Surgeon: Lin Landsman, MD;  Location: [place];  Service: Endoscopy;  Laterality: N/A;  . FOOT SURGERY      Prior to Admission medications   Medication Sig Start Date End Date Taking? Authorizing Provider  DULoxetine (CYMBALTA) 60 MG capsule TAKE 1 CAPSULE BY MOUTH DAILY. 08/25/19  Yes Bacigalupo, [name], MD  Multiple Vitamin (MULTIVITAMIN WITH MINERALS) TABS tablet Take 1 tablet by mouth daily.   Yes [provider]  psyllium (METAMUCIL) 58.6 % powder Take 2 packets by mouth daily.   Yes [provider]  triamterene-hydrochlorothiazide (MAXZIDE-25) 37.5-25 MG tablet TAKE 1 TABLET BY MOUTH ONCE  DAILY 12/01/19  Yes Bacigalupo, [name], MD  vitamin C (ASCORBIC ACID) 500 MG tablet Take 500 mg by mouth daily.   Yes [provider]  lansoprazole (PREVACID) 30 MG capsule Take 1 capsule (30 mg total) by mouth 2 (two) times daily before a meal. 01/21/19 07/07/19  Lendon George, Tally Due, MD  LINZESS 290 MCG CAPS capsule Take 1 capsule (290 mcg total) by mouth daily. 01/21/19 07/07/19  Lin Landsman, MD  Na Sulfate-K Sulfate-Mg Sulf 17.5-3.13-1.6 GM/177ML SOLN Take 1 kit by mouth one time only at 4 PM for 1 dose. 12/22/19 12/23/19  Lin Landsman, MD  SUMAtriptan (IMITREX) 100 MG tablet Take 100 mg by mouth every 2 (two) hours as needed for migraine. May repeat in 2 hours if headache persists or recurs.    [provider]    Allergies as of 12/22/2019 - Review Complete 12/22/2019  Allergen Reaction Noted  . Gluten meal  04/22/2019  . Lactose  04/22/2019  . Ranitidine Diarrhea and Other (See Comments) 08/30/2016  . Amphetamine-dextroamphetamine Other (See Comments) 05/10/2016  . Lactose intolerance (gi)  02/05/2018  . Reglan [metoclopramide] Other (See Comments) 12/15/2016  . Topiramate  01/21/2019  . Topiramate er Other (See Comments) 01/16/2018  . Wheat bran  02/05/2018  . Soap Rash 02/20/2017  . Sulfa antibiotics Anxiety 12/15/2016    Family History  Problem Relation Age of Onset  . Hypertension Mother   . Hyperlipidemia Mother   .  Glaucoma Mother   . Diabetes Mother        type 2   . COPD Father        emphysema  . Alcohol abuse Father   . Colon cancer Sister   . Cancer Sister        rectal/colon cancer no h/o IBD  . Colon polyps Sister   . Asthma Sister   . COPD Sister   . Kidney disease Sister   . Mental illness Sister        bipolar   . Miscarriages / Stillbirths Sister   . Uterine cancer Sister   . Alcohol abuse Son   . Depression Son   . Diabetes Son   . Drug abuse Son   . Mental illness Son        bipolar   . Diabetes Maternal Grandmother     . COPD Paternal Grandmother   . Cancer Paternal Grandfather        FH colon cancer maternal aunts/uncles   . Miscarriages / Stillbirths Sister   . Arthritis Son   . Arthritis Son   . Breast cancer Neg Hx     Social History   Socioeconomic History  . Marital status: Married    Spouse name: Kyung Rudd  . Number of children: 6  . Years of education: 9  . Highest education level: Associate degree: academic program  Occupational History  . Occupation: Respiratory Therapist    Employer: [employer]  Tobacco Use  . Smoking status: Former Smoker    Packs/day: 0.75    Years: 5.00    Pack years: 3.75    Types: Cigarettes    Quit date: 07/06/1987    Years since quitting: 32.4  . Smokeless tobacco: Never Used  Vaping Use  . Vaping Use: Never used  Substance and Sexual Activity  . Alcohol use: Yes    Alcohol/week: 2.0 - 4.0 standard drinks    Types: 2 - 4 Cans of beer per week    Comment: wine cooler  . Drug use: Never  . Sexual activity: Not on file  Other Topics Concern  . Not on file  Social History Narrative  . Not on file   Social Determinants of Health   Financial Resource Strain:   . Difficulty of Paying Living Expenses:   Food Insecurity:   . Worried About Charity fundraiser in the Last Year:   . Arboriculturist in the Last Year:   Transportation Needs:   . Film/video editor (Medical):   Marland Kitchen Lack of Transportation (Non-Medical):   Physical Activity:   . Days of Exercise per Week:   . Minutes of Exercise per Session:   Stress:   . Feeling of Stress :   Social Connections:   . Frequency of Communication with Friends and Family:   . Frequency of Social Gatherings with Friends and Family:   . Attends Religious Services:   . Active Member of Clubs or Organizations:   . Attends Archivist Meetings:   Marland Kitchen Marital Status:   Intimate Partner Violence:   . Fear of Current or Ex-Partner:   . Emotionally Abused:   Marland Kitchen Physically Abused:   . Sexually Abused:      Review of Systems: See HPI, otherwise negative ROS  Physical Exam: BP (!) 151/85 Comment: patient aware to take BP meds this PM  Pulse 63   Temp 97.8 F (36.6 C) (Oral)   Resp 10   Ht 5'  2" (1.575 m)   Wt 80.3 kg   SpO2 100%   BMI 32.37 kg/m  General:   Alert,  pleasant and cooperative in NAD Head:  Normocephalic and atraumatic. Neck:  Supple; no masses or thyromegaly. Lungs:  Clear throughout to auscultation.    Heart:  Regular rate and rhythm. Abdomen:  Soft, nontender and nondistended. Normal bowel sounds, without guarding, and without rebound.   Neurologic:  Alert and  oriented x4;  grossly normal neurologically.  Impression/Plan: [place] is here for an colonoscopy to be performed for personal h/o colon polyps  Risks, benefits, limitations, and alternatives regarding  colonoscopy have been reviewed with the patient.  Questions have been answered.  All parties agreeable.   Sherri Sear, MD  12/23/2019, 2:40 PM

## 2019-12-23 NOTE — Anesthesia Preprocedure Evaluation (Addendum)
Anesthesia Evaluation  Patient identified by MRN, date of birth, ID band Patient awake    Reviewed: Allergy & Precautions, H&P , NPO status , Patient's Chart, lab work & pertinent test results  Airway Mallampati: II  TM Distance: >3 FB Neck ROM: full    Dental  (+) Teeth Intact   Pulmonary neg COPD, former smoker,    breath sounds clear to auscultation       Cardiovascular hypertension, (-) angina(-) Past MI and (-) Cardiac Stents (-) dysrhythmias  Rhythm:regular Rate:Normal     Neuro/Psych  Headaches, Anxiety    GI/Hepatic Neg liver ROS, GERD  ,  Endo/Other  negative endocrine ROS  Renal/GU Renal diseaseCRI  negative genitourinary   Musculoskeletal  (+) Arthritis , Fibromyalgia -  Abdominal   Peds  Hematology negative hematology ROS (+)   Anesthesia Other Findings Past Medical History: No date: Fatty liver No date: Fibromyalgia No date: GERD (gastroesophageal reflux disease) No date: Hemorrhoids No date: Hot flashes No date: Hypertension No date: IBS (irritable bowel syndrome) 10/25/2016: LUQ abdominal pain No date: Migraines No date: Miscarriage     Comment:  x 2; 4 live births  No date: Multilevel degenerative disc disease No date: Vocal cord dysfunction  Past Surgical History: No date: ABDOMINAL HYSTERECTOMY     Comment:  2012 removed cervix h/o abnormal pap  No date: CHOLECYSTECTOMY     Comment:  2000 No date: COLONOSCOPY     Comment:  2018 with + polpys and GIB 2/2 polyp removal  12/22/2019: COLONOSCOPY WITH PROPOFOL; N/A     Comment:  Procedure: COLONOSCOPY WITH PROPOFOL;  Surgeon: Lin Landsman, MD;  Location: [facility] ENDOSCOPY;  Service:               Gastroenterology;  Laterality: N/A; No date: ELBOW DEBRIDEMENT 02/06/2018: ESOPHAGOGASTRODUODENOSCOPY (EGD) WITH PROPOFOL; N/A     Comment:  Procedure: ESOPHAGOGASTRODUODENOSCOPY (EGD) WITH               PROPOFOL with biopsies;   Surgeon: Lin Landsman,               MD;  Location: [place];  Service: Endoscopy;               Laterality: N/A; No date: FOOT SURGERY     Reproductive/Obstetrics negative OB ROS                            Anesthesia Physical Anesthesia Plan  ASA: II  Anesthesia Plan: General   Post-op Pain Management:    Induction:   PONV Risk Score and Plan: Propofol infusion and TIVA  Airway Management Planned:   Additional Equipment:   Intra-op Plan:   Post-operative Plan:   Informed Consent: I have reviewed the patients History and Physical, chart, labs and discussed the procedure including the risks, benefits and alternatives for the proposed anesthesia with the patient or authorized representative who has indicated his/her understanding and acceptance.     Dental Advisory Given  Plan Discussed with: Anesthesiologist, CRNA and Surgeon  Anesthesia Plan Comments:         Anesthesia Quick Evaluation

## 2019-12-23 NOTE — Op Note (Signed)
[facility] Gastroenterology Patient Name: Elizabeth Bowers Procedure Date: 12/23/2019 1:20 PM MRN: [id number] Account #: [id number] Date of Birth: Apr 08, 1966 Admit Type: Outpatient Age: 54 Room: [facility] ENDO ROOM 1 Gender: Female Note Status: Finalized Procedure:             Colonoscopy Indications:           High risk colon cancer surveillance: Personal history                         of sessile serrated colon polyp (10 mm or greater in                         size), , Last colonoscopy: July 2021, suboptimal prep                         on 12/22/19 Providers:             Lin Landsman MD, MD Referring MD:          No Local Md, MD (Referring MD) Medicines:             Monitored Anesthesia Care Complications:         No immediate complications. Estimated blood loss: None. Procedure:             Pre-Anesthesia Assessment:                        - Prior to the procedure, a History and Physical was                         performed, and patient medications and allergies were                         reviewed. The patient is competent. The risks and                         benefits of the procedure and the sedation options and                         risks were discussed with the patient. All questions                         were answered and informed consent was obtained.                         Patient identification and proposed procedure were                         verified by the physician, the nurse, the                         anesthesiologist, the anesthetist and the technician                         in the pre-procedure area in the procedure room in the                         endoscopy suite. Mental Status Examination: alert and  oriented. Airway Examination: normal oropharyngeal                         airway and neck mobility. Respiratory Examination:                         clear to auscultation. CV Examination: normal.                          Prophylactic Antibiotics: The patient does not require                         prophylactic antibiotics. Prior Anticoagulants: The                         patient has taken no previous anticoagulant or                         antiplatelet agents. ASA Grade Assessment: II - A                         patient with mild systemic disease. After reviewing                         the risks and benefits, the patient was deemed in                         satisfactory condition to undergo the procedure. The                         anesthesia plan was to use monitored anesthesia care                         (MAC). Immediately prior to administration of                         medications, the patient was re-assessed for adequacy                         to receive sedatives. The heart rate, respiratory                         rate, oxygen saturations, blood pressure, adequacy of                         pulmonary ventilation, and response to care were                         monitored throughout the procedure. The physical                         status of the patient was re-assessed after the                         procedure.                        After obtaining informed consent, the colonoscope was  passed under direct vision. Throughout the procedure,                         the patient's blood pressure, pulse, and oxygen                         saturations were monitored continuously. The                         Colonoscope was introduced through the anus and                         advanced to the the cecum, identified by appendiceal                         orifice and ileocecal valve. The colonoscopy was                         performed without difficulty. The patient tolerated                         the procedure well. The quality of the bowel                         preparation was evaluated using the BBPS [facility] Bowel                         Preparation  Scale) with scores of: Right Colon = 1                         (portion of mucosa seen, but other areas not well seen                         due to staining, residual stool and/or opaque liquid),                         Transverse Colon = 2 (minor amount of residual                         staining, small fragments of stool and/or opaque                         liquid, but mucosa seen well) and Left Colon = 2                         (minor amount of residual staining, small fragments of                         stool and/or opaque liquid, but mucosa seen well). The                         total BBPS score equals 5. Findings:      The perianal and digital rectal examinations were normal. Pertinent       negatives include normal sphincter tone and no palpable rectal lesions.      A 9 mm polyp was found in the cecum. The polyp was sessile. The polyp  was removed with a cold snare. Resection and retrieval were complete.      Copious quantities of semi-liquid stool was found in the entire colon,       precluding visualization. Lavage of the area was performed using sterile       water, resulting in clearance with fair visualization.      The retroflexed view of the distal rectum and anal verge was normal and       showed no anal or rectal abnormalities. Impression:            - One 9 mm polyp in the cecum, removed with a cold                         snare. Resected and retrieved.                        - Stool in the entire examined colon.                        - The distal rectum and anal verge are normal on                         retroflexion view. Recommendation:        - Discharge patient to home (with escort).                        - High fiber diet today.                        - Continue present medications.                        - Await pathology results.                        - Repeat colonoscopy in 3 years for surveillance. Procedure Code(s):     --- Professional ---                         [phone], Colonoscopy, flexible; with removal of                         tumor(s), polyp(s), or other lesion(s) by snare                         technique Diagnosis Code(s):     --- Professional ---                        Z86.010, Personal history of colonic polyps                        K63.5, Polyp of colon CPT copyright 2019 American Medical Association. All rights reserved. The codes documented in this report are preliminary and upon coder review may  be revised to meet current compliance requirements. Dr. Ulyess Mort Lin Landsman MD, MD 12/23/2019 1:59:02 PM This report has been signed electronically. Number of Addenda: 0 Note Initiated On: 12/23/2019 1:20 PM Scope Withdrawal Time: 0 hours 10 minutes 45 seconds  Total Procedure Duration: 0 hours 17 minutes 32 seconds  Estimated Blood Loss:  Estimated blood loss: none.  [facility]

## 2019-12-23 NOTE — Transfer of Care (Signed)
Immediate Anesthesia Transfer of Care Note  Patient: Elizabeth Bowers  Procedure(s) Performed: COLONOSCOPY WITH PROPOFOL (N/A )  Patient Location: PACU and Endoscopy Unit  Anesthesia Type:General  Level of Consciousness: drowsy  Airway & Oxygen Therapy: Patient Spontanous Breathing and Patient connected to nasal cannula oxygen  Post-op Assessment: Report given to RN and Post -op Vital signs reviewed and stable  Post vital signs: Reviewed and stable  Last Vitals:  Vitals Value Taken Time  BP 129/88 12/23/19 1359  Temp    Pulse 89 12/23/19 1400  Resp 27 12/23/19 1400  SpO2 95 % 12/23/19 1400  Vitals shown include unvalidated device data.  Last Pain:  Vitals:   12/23/19 1248  TempSrc: Temporal         Complications: No complications documented.

## 2019-12-23 NOTE — Anesthesia Postprocedure Evaluation (Signed)
Anesthesia Post Note  Patient: Elizabeth Bowers  Procedure(s) Performed: COLONOSCOPY WITH PROPOFOL (N/A )  Patient location during evaluation: PACU Anesthesia Type: General Level of consciousness: awake and alert Pain management: pain level controlled Vital Signs Assessment: post-procedure vital signs reviewed and stable Respiratory status: spontaneous breathing, nonlabored ventilation and respiratory function stable Cardiovascular status: blood pressure returned to baseline and stable Postop Assessment: no apparent nausea or vomiting Anesthetic complications: no   No complications documented.   Last Vitals:  Vitals:   12/23/19 1423 12/23/19 1430  BP:  (!) 151/85  Pulse: 70 63  Resp: 13 10  Temp:    SpO2: 99% 100%    Last Pain:  Vitals:   12/23/19 1359  TempSrc: Oral                 Tera Mater

## 2019-12-24 ENCOUNTER — Encounter: Payer: Self-pay | Admitting: Gastroenterology

## 2019-12-25 ENCOUNTER — Encounter: Payer: Self-pay | Admitting: Gastroenterology

## 2019-12-25 LAB — SURGICAL PATHOLOGY

## 2019-12-26 ENCOUNTER — Encounter: Payer: Self-pay | Admitting: Family Medicine

## 2020-01-02 ENCOUNTER — Encounter: Payer: Self-pay | Admitting: Family Medicine

## 2020-01-22 ENCOUNTER — Other Ambulatory Visit: Payer: Self-pay | Admitting: Gastroenterology

## 2020-01-22 DIAGNOSIS — K219 Gastro-esophageal reflux disease without esophagitis: Secondary | ICD-10-CM

## 2020-01-22 DIAGNOSIS — K5904 Chronic idiopathic constipation: Secondary | ICD-10-CM

## 2020-02-02 ENCOUNTER — Other Ambulatory Visit: Payer: Self-pay

## 2020-02-02 ENCOUNTER — Ambulatory Visit (INDEPENDENT_AMBULATORY_CARE_PROVIDER_SITE_OTHER): Payer: 59 | Admitting: Family Medicine

## 2020-02-02 ENCOUNTER — Encounter: Payer: Self-pay | Admitting: Family Medicine

## 2020-02-02 ENCOUNTER — Other Ambulatory Visit: Payer: Self-pay | Admitting: Family Medicine

## 2020-02-02 VITALS — BP 134/81 | HR 83 | Temp 98.7°F | Resp 16 | Ht 62.0 in | Wt 183.0 lb

## 2020-02-02 DIAGNOSIS — K581 Irritable bowel syndrome with constipation: Secondary | ICD-10-CM | POA: Diagnosis not present

## 2020-02-02 DIAGNOSIS — F419 Anxiety disorder, unspecified: Secondary | ICD-10-CM | POA: Diagnosis not present

## 2020-02-02 DIAGNOSIS — I10 Essential (primary) hypertension: Secondary | ICD-10-CM

## 2020-02-02 DIAGNOSIS — E669 Obesity, unspecified: Secondary | ICD-10-CM

## 2020-02-02 DIAGNOSIS — E66811 Obesity, class 1: Secondary | ICD-10-CM

## 2020-02-02 DIAGNOSIS — Z6832 Body mass index (BMI) 32.0-32.9, adult: Secondary | ICD-10-CM

## 2020-02-02 DIAGNOSIS — N1831 Chronic kidney disease, stage 3a: Secondary | ICD-10-CM | POA: Diagnosis not present

## 2020-02-02 DIAGNOSIS — K219 Gastro-esophageal reflux disease without esophagitis: Secondary | ICD-10-CM

## 2020-02-02 MED ORDER — LANSOPRAZOLE 30 MG PO CPDR: 30.0000 mg | DELAYED_RELEASE_CAPSULE | Freq: Two times a day (BID) | ORAL | 3 refills | Status: DC

## 2020-02-02 MED ORDER — DULOXETINE HCL 60 MG PO CPEP: 60.0000 mg | ORAL_CAPSULE | Freq: Every day | ORAL | 2 refills | Status: DC

## 2020-02-02 MED ORDER — TRIAMTERENE-HCTZ 37.5-25 MG PO TABS: 1.0000 | ORAL_TABLET | Freq: Every day | ORAL | 1 refills | Status: DC

## 2020-02-02 MED ORDER — LINZESS 290 MCG PO CAPS: 290.0000 ug | ORAL_CAPSULE | Freq: Every day | ORAL | 3 refills | Status: DC

## 2020-02-02 NOTE — Assessment & Plan Note (Signed)
Well controlled Continue current medications Recheck metabolic panel F/u in  months

## 2020-02-02 NOTE — Progress Notes (Signed)
Established patient visit   Patient: Elizabeth Bowers   DOB: May 05, 1966   54 y.o. Female  MRN: [id number] Visit Date: 02/02/2020  Today's healthcare provider: Lavon Paganini, MD   I,Sulibeya S Dimas,acting as a scribe for Lavon Paganini, MD.,have documented all relevant documentation on the behalf of Lavon Paganini, MD,as directed by  Lavon Paganini, MD while in the presence of Lavon Paganini, MD.  Chief Complaint  Patient presents with  . Depression  . Hypertension   Subjective    HPI  Hypertension, follow-up  BP Readings from Last 3 Encounters:  02/02/20 134/81  12/23/19 (!) 151/85  12/22/19 136/88   Wt Readings from Last 3 Encounters:  02/02/20 183 lb (83 kg)  12/23/19 177 lb (80.3 kg)  12/22/19 179 lb (81.2 kg)     She was last seen for hypertension 6 months ago.  BP at that visit was 129/87. Management since that visit includes no changes.  She reports excellent compliance with treatment. She is not having side effects.  She is following a Low Sodium diet. She is exercising. She does not smoke.  Use of agents associated with hypertension: none.   Outside blood pressures are not being checked. Symptoms: No chest pain No chest pressure  No palpitations No syncope  No dyspnea No orthopnea  No paroxysmal nocturnal dyspnea No lower extremity edema   Pertinent labs: Lab Results  Component Value Date   CHOL 119 01/02/2019   HDL 55 01/02/2019   LDLCALC 41 01/02/2019   TRIG 113 01/02/2019   CHOLHDL 2.2 01/02/2019   Lab Results  Component Value Date   NA 138 07/07/2019   K 4.0 07/07/2019   CREATININE 0.91 07/07/2019   GFRNONAA >60 07/07/2019   GFRAA >60 07/07/2019   GLUCOSE 87 07/07/2019     The ASCVD Risk score (Goff DC Jr., et al., 2013) failed to calculate for the following reasons:   The valid total cholesterol range is 130 to 320 mg/dL    --------------------------------------------------------------------------------------------------- Depression, Follow-up  She  was last seen for this 6 months ago. Changes made at last visit include no changes.   She reports excellent compliance with treatment. She is not having side effects.   She reports excellent tolerance of treatment. Current symptoms include: depressed mood, difficulty concentrating, fatigue, hypersomnia and weight gain She feels she is Worse since last visit.  Depression screen [facility] 2/9 02/02/2020 01/02/2019 10/24/2018  Decreased Interest 2 1 1   Down, Depressed, Hopeless 2 1 1   PHQ - 2 Score 4 2 2   Altered sleeping 3 2 1   Tired, decreased energy 3 2 1   Change in appetite 3 2 1   Feeling bad or failure about yourself  2 0 1  Trouble concentrating 1 0 1  Moving slowly or fidgety/restless 1 0 1  Suicidal thoughts 0 1 1  PHQ-9 Score 17 9 9   Difficult doing work/chores Somewhat difficult - Somewhat difficult  Some recent data might be hidden    -----------------------------------------------------------------------------------------   Patient Active Problem List   Diagnosis Date Noted  . Encounter for colonoscopy due to history of adenomatous colonic polyps   . Insomnia 07/07/2019  . Chronic low back pain (Bilateral) w/o sciatica 08/08/2018  . Abnormal MRI, cervical spine (07/10/2018) 07/17/2018  . Cervical spondylitis with radiculitis (HCC) (C6) (Bilateral) (R>L) 06/26/2018  . Chronic sacroiliac joint pain (Bilateral) (R>L) 06/26/2018  . Cervical Grade 1 (2 mm) Retrolisthesis C5 over C6 03/20/2018  . Cervical foraminal stenosis (C3-4 & C5-6) (  Bilateral) 03/20/2018  . DDD (degenerative disc disease), cervical 03/20/2018  . Long term prescription benzodiazepine use 03/20/2018  . CKD (chronic kidney disease) stage 3, GFR 30-59 ml/min 03/20/2018  . Acid reflux disease 03/20/2018  . Spondylosis without myelopathy or radiculopathy, cervical region 03/20/2018   . Chronic hip pain (Bilateral) (L>R) 03/20/2018  . Lumbar facet syndrome (Bilateral) (R>L) 03/20/2018  . Spondylosis without myelopathy or radiculopathy, lumbar region 03/20/2018  . Chronic Sacroiliac joint dysfunction (Bilateral) (R>L) 03/20/2018  . Chronic Somatic dysfunction of sacroiliac joint (Bilateral) (R>L) 03/20/2018  . Osteoarthritis of hip (Bilateral) 03/20/2018  . Cervicogenic headache (Bilateral) 03/20/2018  . Cervicalgia (Bilateral) (R>L) 03/20/2018  . Cervical facet syndrome (Bilateral) (R>L) 03/20/2018  . Chronic occipital neuralgia (Bilateral) 03/20/2018  . Chronic upper extremity pain (Fourth Area of Pain) (Bilateral) (R>L) 03/19/2018  . Ganglion cyst 02/20/2018  . Chronic low back pain (Secondary Area of Pain) (Bilateral) (L>R) w/ sciatica (Bilateral) 01/16/2018  . Chronic lower extremity pain [facility] Area of Pain) (Bilateral) (L>R) 01/16/2018  . Chronic neck pain (Primary Area of Pain) (Bilateral) (midline) 01/16/2018  . Chronic foot pain (Left) 01/16/2018  . Chronic pain syndrome 01/16/2018  . Pharmacologic therapy 01/16/2018  . Disorder of skeletal system 01/16/2018  . Problems influencing health status 01/16/2018  . Obesity 11/21/2017  . Anxiety 09/28/2017  . Chronic knee pain (Right) 09/28/2017  . Fatty liver 07/09/2017  . Colon polyps 07/09/2017  . Fibromyalgia 07/09/2017  . Sinusitis, chronic 07/09/2017  . Allergic rhinitis 07/09/2017  . IBS (irritable bowel syndrome) 07/09/2017  . Eyelid dermatitis, allergic/contact 07/09/2017  . Hypertension 01/12/2017  . Superficial thrombophlebitis 01/12/2017  . Chronic sacroiliac joint pain (Right) 12/16/2016  . DDD (degenerative disc disease), lumbar 07/18/2016  . Family hx of colon cancer 07/18/2016   Social History   Tobacco Use  . Smoking status: Former Smoker    Packs/day: 0.75    Years: 5.00    Pack years: 3.75    Types: Cigarettes    Quit date: 07/06/1987    Years since quitting: 32.6  . Smokeless  tobacco: Never Used  Vaping Use  . Vaping Use: Never used  Substance Use Topics  . Alcohol use: Yes    Alcohol/week: 2.0 - 4.0 standard drinks    Types: 2 - 4 Cans of beer per week    Comment: wine cooler  . Drug use: Never   Allergies  Allergen Reactions  . Gluten Meal     Other reaction(s): Abdominal Pain, Myalgias (Muscle Pain), Other (see comments) Other reaction(s): Other (See Comments)   . Lactose     Other reaction(s): Abdominal Pain, Myalgias (Muscle Pain), Other (see comments) Other reaction(s): Other (See Comments)   . Ranitidine Diarrhea and Other (See Comments)    Diarrhea and stomach cramping  . Amphetamine-Dextroamphetamine Other (See Comments)    Elevated BP  . Lactose Intolerance (Gi)     bloating  . Reglan [Metoclopramide] Other (See Comments)    Increased prolactin level   . Topiramate   . Topiramate Er Other (See Comments)    Severe headache, nausea, vomiting, dizziness, visual disturbance    . Wheat Bran     bloating  . Soap Rash    PROVON excess use  . Sulfa Antibiotics Anxiety     Medications: Outpatient Medications Prior to Visit  Medication Sig  . Multiple Vitamin (MULTIVITAMIN WITH MINERALS) TABS tablet Take 1 tablet by mouth daily.  . psyllium (METAMUCIL) 58.6 % powder Take 2 packets by mouth daily.  Marland Kitchen  SUMAtriptan (IMITREX) 100 MG tablet Take 100 mg by mouth every 2 (two) hours as needed for migraine. May repeat in 2 hours if headache persists or recurs.  . vitamin C (ASCORBIC ACID) 500 MG tablet Take 500 mg by mouth daily.  . [DISCONTINUED] DULoxetine (CYMBALTA) 60 MG capsule TAKE 1 CAPSULE BY MOUTH DAILY.  . [DISCONTINUED] triamterene-hydrochlorothiazide (MAXZIDE-25) 37.5-25 MG tablet TAKE 1 TABLET BY MOUTH ONCE DAILY  . [DISCONTINUED] lansoprazole (PREVACID) 30 MG capsule Take 1 capsule (30 mg total) by mouth 2 (two) times daily before a meal.  . [DISCONTINUED] LINZESS 290 MCG CAPS capsule Take 1 capsule (290 mcg total) by mouth daily.    No facility-administered medications prior to visit.    Review of Systems  Constitutional: Positive for activity change, appetite change and fatigue.  Respiratory: Positive for chest tightness and shortness of breath.   Cardiovascular: Negative for chest pain and palpitations.  Psychiatric/Behavioral: Positive for decreased concentration and sleep disturbance. The patient is nervous/anxious.       Objective    BP 134/81 (BP Location: Left Arm, Patient Position: Sitting, Cuff Size: Large)   Pulse 83   Temp 98.7 F (37.1 C) (Oral)   Resp 16   Ht 5\' 2"  (1.575 m)   Wt 183 lb (83 kg)   BMI 33.47 kg/m  BP Readings from Last 3 Encounters:  02/02/20 134/81  12/23/19 (!) 151/85  12/22/19 136/88   Wt Readings from Last 3 Encounters:  02/02/20 183 lb (83 kg)  12/23/19 177 lb (80.3 kg)  12/22/19 179 lb (81.2 kg)      Physical Exam Vitals reviewed.  Constitutional:      General: She is not in acute distress.    Appearance: Normal appearance. She is well-developed. She is obese. She is not diaphoretic.  HENT:     Head: Normocephalic and atraumatic.  Eyes:     General: No scleral icterus.    Conjunctiva/sclera: Conjunctivae normal.  Neck:     Thyroid: No thyromegaly.  Cardiovascular:     Rate and Rhythm: Normal rate and regular rhythm.     Pulses: Normal pulses.     Heart sounds: Normal heart sounds. No murmur heard.   Pulmonary:     Effort: Pulmonary effort is normal. No respiratory distress.     Breath sounds: Normal breath sounds. No wheezing, rhonchi or rales.  Musculoskeletal:     Cervical back: Neck supple.     Right lower leg: No edema.     Left lower leg: No edema.  Lymphadenopathy:     Cervical: No cervical adenopathy.  Skin:    General: Skin is warm and dry.     Findings: No rash.  Neurological:     Mental Status: She is alert and oriented to person, place, and time. Mental status is at baseline.  Psychiatric:        Mood and Affect: Mood normal.         Behavior: Behavior normal.     No results found for any visits on 02/02/20.  Assessment & Plan     Problem List Items Addressed This Visit      Cardiovascular and Mediastinum   Hypertension - Primary    Well controlled Continue current medications Recheck metabolic panel F/u in  months       Relevant Medications   triamterene-hydrochlorothiazide (MAXZIDE-25) 37.5-25 MG tablet   Other Relevant Orders   Lipid Panel With LDL/HDL Ratio   Comprehensive metabolic panel     Digestive  IBS (irritable bowel syndrome)    Chronic IBS with constipation Stable on Linzess and Colace Followed by GI She is getting regular colonoscopies due to her sister's history of colon cancer early in life as well      Relevant Medications   LINZESS 290 MCG CAPS capsule   lansoprazole (PREVACID) 30 MG capsule   Acid reflux disease    Well controlled Continue PPI      Relevant Medications   LINZESS 290 MCG CAPS capsule   lansoprazole (PREVACID) 30 MG capsule     Genitourinary   CKD (chronic kidney disease) stage 3, GFR 30-59 ml/min (Chronic)    Chronic and stable Avoid NSAIDs Recheck metabolic panel        Other   Anxiety    Chronic and stable Fairly well controlled, but exacerbated in the setting of the current pandemic with current wave of Covid patients that she is helping to care for Continue Cymbalta and trazodone as needed      Relevant Medications   DULoxetine (CYMBALTA) 60 MG capsule   Obesity    Discussed importance of healthy weight management Discussed diet and exercise       Relevant Orders   Lipid Panel With LDL/HDL Ratio       Return in about 4 months (around 06/03/2020) for CPE, as scheduled.      I, Lavon Paganini, MD, have reviewed all documentation for this visit. The documentation on 02/03/20 for the exam, diagnosis, procedures, and orders are all accurate and complete.   Cherylin Waguespack, [name], MD, MPH [facility]

## 2020-02-02 NOTE — Patient Instructions (Signed)

## 2020-02-03 NOTE — Assessment & Plan Note (Signed)
Discussed importance of healthy weight management Discussed diet and exercise  

## 2020-02-03 NOTE — Assessment & Plan Note (Signed)
Chronic and stable Avoid NSAIDs Recheck metabolic panel

## 2020-02-03 NOTE — Assessment & Plan Note (Signed)
Well controlled Continue PPI 

## 2020-02-03 NOTE — Assessment & Plan Note (Signed)
Chronic and stable Fairly well controlled, but exacerbated in the setting of the current pandemic with current wave of Covid patients that she is helping to care for Continue Cymbalta and trazodone as needed

## 2020-02-03 NOTE — Assessment & Plan Note (Signed)
Chronic IBS with constipation Stable on Linzess and Colace Followed by GI She is getting regular colonoscopies due to her sister's history of colon cancer early in life as well

## 2020-02-12 ENCOUNTER — Other Ambulatory Visit: Payer: Self-pay

## 2020-02-12 ENCOUNTER — Ambulatory Visit (INDEPENDENT_AMBULATORY_CARE_PROVIDER_SITE_OTHER): Payer: 59

## 2020-02-12 DIAGNOSIS — Z23 Encounter for immunization: Secondary | ICD-10-CM

## 2020-02-13 ENCOUNTER — Ambulatory Visit: Payer: 59 | Admitting: Family Medicine

## 2020-02-17 NOTE — Progress Notes (Deleted)
° ° ° °  Established patient visit   Patient: Elizabeth Bowers   DOB: [birth year]   54 y.o. Female  MRN: [id number] Visit Date: 02/18/2020  Today's healthcare provider: Mar Daring, PA-C   No chief complaint on file.  Subjective    Hip Pain     ***  {Show patient history (optional):23778::" "}   Medications: Outpatient Medications Prior to Visit  Medication Sig   DULoxetine (CYMBALTA) 60 MG capsule Take 1 capsule (60 mg total) by mouth daily.   lansoprazole (PREVACID) 30 MG capsule Take 1 capsule (30 mg total) by mouth 2 (two) times daily before a meal.   LINZESS 290 MCG CAPS capsule Take 1 capsule (290 mcg total) by mouth daily.   Multiple Vitamin (MULTIVITAMIN WITH MINERALS) TABS tablet Take 1 tablet by mouth daily.   psyllium (METAMUCIL) 58.6 % powder Take 2 packets by mouth daily.   SUMAtriptan (IMITREX) 100 MG tablet Take 100 mg by mouth every 2 (two) hours as needed for migraine. May repeat in 2 hours if headache persists or recurs.   triamterene-hydrochlorothiazide (MAXZIDE-25) 37.5-25 MG tablet Take 1 tablet by mouth daily.   vitamin C (ASCORBIC ACID) 500 MG tablet Take 500 mg by mouth daily.   No facility-administered medications prior to visit.    Review of Systems  {Heme   Chem   Endocrine   Serology   Results Review (optional):23779::" "}  Objective    There were no vitals taken for this visit. {Show previous vital signs (optional):23777::" "}  Physical Exam  ***  No results found for any visits on 02/18/20.  Assessment & Plan     ***  No follow-ups on file.      {provider attestation***:1}   Rubye [place]  [facility] [phone] (phone) [phone] (fax)  [place]

## 2020-02-18 ENCOUNTER — Ambulatory Visit: Payer: 59 | Admitting: Physician Assistant

## 2020-03-03 DIAGNOSIS — H524 Presbyopia: Secondary | ICD-10-CM | POA: Diagnosis not present

## 2020-03-24 ENCOUNTER — Other Ambulatory Visit: Payer: Self-pay

## 2020-03-24 ENCOUNTER — Telehealth: Payer: Self-pay

## 2020-03-24 ENCOUNTER — Other Ambulatory Visit
Admission: RE | Admit: 2020-03-24 | Discharge: 2020-03-24 | Disposition: A | Discharge status: Home / Self Care | Payer: 59 | Attending: Family Medicine | Admitting: Family Medicine

## 2020-03-24 DIAGNOSIS — Z6832 Body mass index (BMI) 32.0-32.9, adult: Secondary | ICD-10-CM | POA: Insufficient documentation

## 2020-03-24 DIAGNOSIS — E669 Obesity, unspecified: Secondary | ICD-10-CM | POA: Insufficient documentation

## 2020-03-24 DIAGNOSIS — I10 Essential (primary) hypertension: Secondary | ICD-10-CM | POA: Insufficient documentation

## 2020-03-24 LAB — COMPREHENSIVE METABOLIC PANEL
ALT: 57 U/L — ABNORMAL HIGH (ref 0–44)
AST: 39 U/L (ref 15–41)
Albumin: 4.3 g/dL (ref 3.5–5.0)
Alkaline Phosphatase: 88 U/L (ref 38–126)
Anion gap: 10 (ref 5–15)
BUN: 14 mg/dL (ref 6–20)
CO2: 26 mmol/L (ref 22–32)
Calcium: 9.5 mg/dL (ref 8.9–10.3)
Chloride: 105 mmol/L (ref 98–111)
Creatinine, Ser: 1.01 mg/dL — ABNORMAL HIGH (ref 0.44–1.00)
GFR, Estimated: >60 mL/min (ref >60)
Glucose, Bld: 112 mg/dL — ABNORMAL HIGH (ref 70–99)
Potassium: 3.8 mmol/L (ref 3.5–5.1)
Sodium: 141 mmol/L (ref 135–145)
Total Bilirubin: 0.7 mg/dL (ref 0.3–1.2)
Total Protein: 7.2 g/dL (ref 6.5–8.1)

## 2020-03-24 LAB — LIPID PANEL
Cholesterol: 112 mg/dL (ref 0–200)
HDL: 45 mg/dL (ref >40)
LDL Cholesterol: 54 mg/dL (ref 0–99)
Total CHOL/HDL Ratio: 2.5 RATIO
Triglycerides: 64 mg/dL (ref <150)
VLDL: 13 mg/dL (ref 0–40)

## 2020-03-24 NOTE — Telephone Encounter (Signed)
Pt advised.   Thanks,   -Julious Langlois  

## 2020-03-24 NOTE — Telephone Encounter (Signed)
-----   Message from Virginia Crews, MD sent at 03/24/2020  8:10 AM EDT ----- Normal labs

## 2020-03-27 ENCOUNTER — Other Ambulatory Visit: Payer: Self-pay

## 2020-03-27 ENCOUNTER — Emergency Department
Admission: EM | Admit: 2020-03-27 | Discharge: 2020-03-27 | Disposition: A | Discharge status: Home / Self Care | Payer: 59 | Attending: Emergency Medicine | Admitting: Emergency Medicine

## 2020-03-27 DIAGNOSIS — I129 Hypertensive chronic kidney disease with stage 1 through stage 4 chronic kidney disease, or unspecified chronic kidney disease: Secondary | ICD-10-CM | POA: Diagnosis not present

## 2020-03-27 DIAGNOSIS — N183 Chronic kidney disease, stage 3 unspecified: Secondary | ICD-10-CM | POA: Diagnosis not present

## 2020-03-27 DIAGNOSIS — Z79899 Other long term (current) drug therapy: Secondary | ICD-10-CM | POA: Insufficient documentation

## 2020-03-27 DIAGNOSIS — Z87891 Personal history of nicotine dependence: Secondary | ICD-10-CM | POA: Diagnosis not present

## 2020-03-27 DIAGNOSIS — H1131 Conjunctival hemorrhage, right eye: Secondary | ICD-10-CM | POA: Insufficient documentation

## 2020-03-27 DIAGNOSIS — H5789 Other specified disorders of eye and adnexa: Secondary | ICD-10-CM | POA: Diagnosis present

## 2020-03-27 MED ORDER — TETRACAINE HCL 0.5 % OP SOLN
2.0000 [drp] | Freq: Once | OPHTHALMIC | Status: DC
  Filled 2020-03-27: qty 4

## 2020-03-27 MED ORDER — LEVOCETIRIZINE DIHYDROCHLORIDE 5 MG PO TABS: 5.0000 mg | ORAL_TABLET | Freq: Every evening | ORAL | 2 refills | Status: DC

## 2020-03-27 MED ORDER — FLUTICASONE PROPIONATE 50 MCG/ACT NA SUSP
1.0000 | Freq: Two times a day (BID) | NASAL | 2 refills | Status: DC
Start: 2020-03-27 — End: 2020-06-07

## 2020-03-27 NOTE — ED Provider Notes (Signed)
[facility] Emergency Department Provider Note  ____________________________________________  Time seen: Approximately 12:24 PM  I have reviewed the triage vital signs and the nursing notes.   HISTORY  Chief Complaint Eye Problem    HPI Elizabeth Bowers is a 54 y.o. female who presents the emergency department concern for erythema to the medial right thigh.  Patient states that she has no contact with pinkeye.  She has had some excess tearing but no drainage.  Patient denies any trauma to the right eye.  She does wear glasses but no contacts.  No visual changes.  Patient is primarily concerned for the erythema, that she describes as blood to the medial aspect of the eye.  No history of same.  No medications for same.         Past Medical History:  Diagnosis Date   Fatty liver    Fibromyalgia    GERD (gastroesophageal reflux disease)    Hemorrhoids    Hot flashes    Hypertension    IBS (irritable bowel syndrome)    LUQ abdominal pain 10/25/2016   Migraines    Miscarriage    x 2; 4 live births    Multilevel degenerative disc disease    Vocal cord dysfunction     Patient Active Problem List   Diagnosis Date Noted   Encounter for colonoscopy due to history of adenomatous colonic polyps    Insomnia 07/07/2019   Chronic low back pain (Bilateral) w/o sciatica 08/08/2018   Abnormal MRI, cervical spine (07/10/2018) 07/17/2018   Cervical spondylitis with radiculitis (HCC) (C6) (Bilateral) (R>L) 06/26/2018   Chronic sacroiliac joint pain (Bilateral) (R>L) 06/26/2018   Cervical Grade 1 (2 mm) Retrolisthesis C5 over C6 03/20/2018   Cervical foraminal stenosis (C3-4 & C5-6) (Bilateral) 03/20/2018   DDD (degenerative disc disease), cervical 03/20/2018   Long term prescription benzodiazepine use 03/20/2018   CKD (chronic kidney disease) stage 3, GFR 30-59 ml/min (HCC) 03/20/2018   Acid reflux disease 03/20/2018   Spondylosis without  myelopathy or radiculopathy, cervical region 03/20/2018   Chronic hip pain (Bilateral) (L>R) 03/20/2018   Lumbar facet syndrome (Bilateral) (R>L) 03/20/2018   Spondylosis without myelopathy or radiculopathy, lumbar region 03/20/2018   Chronic Sacroiliac joint dysfunction (Bilateral) (R>L) 03/20/2018   Chronic Somatic dysfunction of sacroiliac joint (Bilateral) (R>L) 03/20/2018   Osteoarthritis of hip (Bilateral) 03/20/2018   Cervicogenic headache (Bilateral) 03/20/2018   Cervicalgia (Bilateral) (R>L) 03/20/2018   Cervical facet syndrome (Bilateral) (R>L) 03/20/2018   Chronic occipital neuralgia (Bilateral) 03/20/2018   Chronic upper extremity pain (Fourth Area of Pain) (Bilateral) (R>L) 03/19/2018   Ganglion cyst 02/20/2018   Chronic low back pain (Secondary Area of Pain) (Bilateral) (L>R) w/ sciatica (Bilateral) 01/16/2018   Chronic lower extremity pain (Tertiary Area of Pain) (Bilateral) (L>R) 01/16/2018   Chronic neck pain (Primary Area of Pain) (Bilateral) (midline) 01/16/2018   Chronic foot pain (Left) 01/16/2018   Chronic pain syndrome 01/16/2018   Pharmacologic therapy 01/16/2018   Disorder of skeletal system 01/16/2018   Problems influencing health status 01/16/2018   Obesity 11/21/2017   Anxiety 09/28/2017   Chronic knee pain (Right) 09/28/2017   Fatty liver 07/09/2017   Colon polyps 07/09/2017   Fibromyalgia 07/09/2017   Sinusitis, chronic 07/09/2017   Allergic rhinitis 07/09/2017   IBS (irritable bowel syndrome) 07/09/2017   Eyelid dermatitis, allergic/contact 07/09/2017   Hypertension 01/12/2017   Superficial thrombophlebitis 01/12/2017   Chronic sacroiliac joint pain (Right) 12/16/2016   DDD (degenerative disc disease), lumbar 07/18/2016  Family hx of colon cancer 07/18/2016    Past Surgical History:  Procedure Laterality Date   ABDOMINAL HYSTERECTOMY     2012 removed cervix h/o abnormal pap    CHOLECYSTECTOMY     2000    COLONOSCOPY     2018 with + polpys and GIB 2/2 polyp removal    COLONOSCOPY WITH PROPOFOL N/A 12/22/2019   Procedure: COLONOSCOPY WITH PROPOFOL;  Surgeon: Lin Landsman, MD;  Location: [place];  Service: Gastroenterology;  Laterality: N/A;   COLONOSCOPY WITH PROPOFOL N/A 12/23/2019   Procedure: COLONOSCOPY WITH PROPOFOL;  Surgeon: Lin Landsman, MD;  Location: [facility] ENDOSCOPY;  Service: Gastroenterology;  Laterality: N/A;  Last name pronounced LEE-MA   ELBOW DEBRIDEMENT     ESOPHAGOGASTRODUODENOSCOPY (EGD) WITH PROPOFOL N/A 02/06/2018   Procedure: ESOPHAGOGASTRODUODENOSCOPY (EGD) WITH PROPOFOL with biopsies;  Surgeon: Lin Landsman, MD;  Location: [place];  Service: Endoscopy;  Laterality: N/A;   FOOT SURGERY      Prior to Admission medications   Medication Sig Start Date End Date Taking? Authorizing Provider  DULoxetine (CYMBALTA) 60 MG capsule Take 1 capsule (60 mg total) by mouth daily. 02/02/20   Virginia Crews, MD  fluticasone (FLONASE) 50 MCG/ACT nasal spray Place 1 spray into both nostrils 2 (two) times daily. 03/27/20   Rhylee Pucillo, Charline Bills, PA-C  lansoprazole (PREVACID) 30 MG capsule Take 1 capsule (30 mg total) by mouth 2 (two) times daily before a meal. 02/02/20 05/02/20  Bacigalupo, [name], MD  levocetirizine (XYZAL) 5 MG tablet Take 1 tablet (5 mg total) by mouth every evening. 03/27/20   Senica Crall, Charline Bills, PA-C  LINZESS 290 MCG CAPS capsule Take 1 capsule (290 mcg total) by mouth daily. 02/02/20 05/02/20  Virginia Crews, MD  Multiple Vitamin (MULTIVITAMIN WITH MINERALS) TABS tablet Take 1 tablet by mouth daily.    [provider]  psyllium (METAMUCIL) 58.6 % powder Take 2 packets by mouth daily.    [provider]  SUMAtriptan (IMITREX) 100 MG tablet Take 100 mg by mouth every 2 (two) hours as needed for migraine. May repeat in 2 hours if headache persists or recurs.    [provider]   triamterene-hydrochlorothiazide (MAXZIDE-25) 37.5-25 MG tablet Take 1 tablet by mouth daily. 02/02/20   Bacigalupo, [name], MD  vitamin C (ASCORBIC ACID) 500 MG tablet Take 500 mg by mouth daily.    [provider]    Allergies Gluten meal, Lactose, Ranitidine, Amphetamine-dextroamphetamine, Lactose intolerance (gi), Reglan [metoclopramide], Topiramate, Topiramate er, Wheat bran, Soap, and Sulfa antibiotics  Family History  Problem Relation Age of Onset   Hypertension Mother    Hyperlipidemia Mother    Glaucoma Mother    Diabetes Mother        type 2    COPD Father        emphysema   Alcohol abuse Father    Colon cancer Sister    Cancer Sister        rectal/colon cancer no h/o IBD   Colon polyps Sister    Asthma Sister    COPD Sister    Kidney disease Sister    Mental illness Sister        bipolar    Miscarriages / [place] Sister    Uterine cancer Sister    Alcohol abuse Son    Depression Son    Diabetes Son    Drug abuse Son    Mental illness Son  bipolar    Diabetes Maternal Grandmother    COPD Paternal Grandmother    Cancer Paternal Grandfather        FH colon cancer maternal aunts/uncles    Miscarriages / Stillbirths Sister    Arthritis Son    Arthritis Son    Breast cancer Neg Hx     Social History Social History   Tobacco Use   Smoking status: Former Smoker    Packs/day: 0.75    Years: 5.00    Pack years: 3.75    Types: Cigarettes    Quit date: 07/06/1987    Years since quitting: 32.7   Smokeless tobacco: Never Used  Vaping Use   Vaping Use: Never used  Substance Use Topics   Alcohol use: Yes    Alcohol/week: 2.0 - 4.0 standard drinks    Types: 2 - 4 Cans of beer per week    Comment: wine cooler   Drug use: Never     Review of Systems  Constitutional: No fever/chills Eyes: No visual changes. No discharge.  Erythema to the medial right eye, possibly blood ENT: No upper respiratory  complaints. Cardiovascular: no chest pain. Respiratory: no cough. No SOB. Gastrointestinal: No abdominal pain.  No nausea, no vomiting.  No diarrhea.  No constipation. Musculoskeletal: Negative for musculoskeletal pain. Skin: Negative for rash, abrasions, lacerations, ecchymosis. Neurological: Negative for headaches, focal weakness or numbness.  10 System ROS otherwise negative.  ____________________________________________   PHYSICAL EXAM:  VITAL SIGNS: ED Triage Vitals  Enc Vitals Group     BP 03/27/20 1159 (!) 159/94     Pulse Rate 03/27/20 1159 90     Resp 03/27/20 1159 18     Temp 03/27/20 1159 99.3 F (37.4 C)     Temp Source 03/27/20 1159 Oral     SpO2 03/27/20 1159 97 %     Weight 03/27/20 1200 183 lb (83 kg)     Height 03/27/20 1200 5\' 2"  (1.575 m)     Head Circumference --      Peak Flow --      Pain Score 03/27/20 1159 0     Pain Loc --      Pain Edu? --      Excl. in [place]? --      Constitutional: Alert and oriented. Well appearing and in no acute distress. Eyes: Conjunctivae on right with subconjunctival hemorrhage to the medial aspect of the eye.  No other appreciable conjunctival changes.  Left eye is unremarkable.  PERRL. EOMI. funduscopic exam reveals red reflex bilaterally.  Vasculature and optic disc unremarkable bilaterally.  No evidence of hyphema.  Right eye is anesthetized.  Tono-Pen used for pressures.  Patient's right eye had following pressures of 14, 17, 18 and 19 respectively. Head: Atraumatic. ENT:      Ears:       Nose: No congestion/rhinnorhea.      Mouth/Throat: Mucous membranes are moist.  Neck: No stridor.    Cardiovascular: Normal rate, regular rhythm. Normal S1 and S2.  Good peripheral circulation. Respiratory: Normal respiratory effort without tachypnea or retractions. Lungs CTAB. Good air entry to the bases with no decreased or absent breath sounds. Musculoskeletal: Full range of motion to all extremities. No gross deformities  appreciated. Neurologic:  Normal speech and language. No gross focal neurologic deficits are appreciated.  Skin:  Skin is warm, dry and intact. No rash noted. Psychiatric: Mood and affect are normal. Speech and behavior are normal. Patient exhibits appropriate insight and judgement.  ____________________________________________   LABS (all labs ordered are listed, but only abnormal results are displayed)  Labs Reviewed - No data to display ____________________________________________  EKG   ____________________________________________  RADIOLOGY   No results found.  ____________________________________________    PROCEDURES  Procedure(s) performed:    Procedures    Medications  tetracaine (PONTOCAINE) 0.5 % ophthalmic solution 2 drop (has no administration in time range)     ____________________________________________   INITIAL IMPRESSION / ASSESSMENT AND PLAN / ED COURSE  Pertinent labs & imaging results that were available during my care of the patient were reviewed by me and considered in my medical decision making (see chart for details).  Review of the [place] CSRS was performed in accordance of the [place] prior to dispensing any controlled drugs.           Patient's diagnosis is consistent with subconjunctival hemorrhage.  Patient presented to the emergency department complaining of erythema to the medial right eye.  No visual changes.  Exam was consistent with subconjunctival hemorrhage to the medial eye.  No evidence of hyphema.  Patient states that she does have some postnasal drip and has coughing and sneezing on a regular basis.  There was no direct trauma to the eye.  Intraocular pressures are reassuring.  At this time I will prescribe medication for postnasal drip leading to patient's cough.  No medications at this time for the right eye.  Follow-up with her ophthalmologist if needed. Patient is given ED precautions to return to the ED for any worsening  or new symptoms.     ____________________________________________  FINAL CLINICAL IMPRESSION(S) / ED DIAGNOSES  Final diagnoses:  Subconjunctival hemorrhage of right eye      NEW MEDICATIONS STARTED DURING THIS VISIT:  ED Discharge Orders         Ordered    fluticasone (FLONASE) 50 MCG/ACT nasal spray  2 times daily        03/27/20 1245    levocetirizine (XYZAL) 5 MG tablet  Every evening        03/27/20 1245              This chart was dictated using voice recognition software/Dragon. Despite best efforts to proofread, errors can occur which can change the meaning. Any change was purely unintentional.    Darletta Moll, PA-C 03/27/20 1246    Vladimir Crofts, MD 03/27/20 1547

## 2020-03-27 NOTE — ED Triage Notes (Signed)
Pt arrived via POV with reports of R eye drainage and itching. Sxs started today.

## 2020-04-12 NOTE — Progress Notes (Signed)
Do you mean the labs ordered /30/21?  The diagnoses are attached.  I10 and E66.9, [id number]

## 2020-04-28 ENCOUNTER — Encounter: Payer: Self-pay | Admitting: Adult Health

## 2020-04-28 ENCOUNTER — Other Ambulatory Visit: Payer: Self-pay

## 2020-04-28 ENCOUNTER — Other Ambulatory Visit: Payer: Self-pay | Admitting: Adult Health

## 2020-04-28 ENCOUNTER — Ambulatory Visit (INDEPENDENT_AMBULATORY_CARE_PROVIDER_SITE_OTHER): Payer: 59 | Admitting: Adult Health

## 2020-04-28 VITALS — BP 153/93 | HR 75 | Temp 98.4°F | Resp 16 | Wt 188.0 lb

## 2020-04-28 DIAGNOSIS — M76811 Anterior tibial syndrome, right leg: Secondary | ICD-10-CM | POA: Insufficient documentation

## 2020-04-28 DIAGNOSIS — M25571 Pain in right ankle and joints of right foot: Secondary | ICD-10-CM | POA: Insufficient documentation

## 2020-04-28 MED ORDER — TRAMADOL HCL 50 MG PO TABS: 50.0000 mg | ORAL_TABLET | Freq: Three times a day (TID) | ORAL | 0 refills | Status: DC | PRN

## 2020-04-28 MED FILL — LINZESS 290 MCG CAPSULE: 290 | 90 days supply | Qty: 90 | Fill #0

## 2020-04-28 MED FILL — LANSOPRAZOLE 30 MG CPDR: 30 | 90 days supply | Qty: 180 | Fill #0

## 2020-04-28 NOTE — Patient Instructions (Addendum)
Apply a compressive ACE bandage. Rest and elevate the affected painful area.  Apply cold compresses intermittently as needed.  As pain recedes, begin normal activities slowly as tolerated.  Call if symptoms persist. [facility] at 1pm. Referral was placed. Please go today.    Ankle Pain The ankle joint holds your body weight and allows you to move around. Ankle pain can occur on either side or the back of one ankle or both ankles. Ankle pain may be sharp and burning or dull and aching. There may be tenderness, stiffness, redness, or warmth around the ankle. Many things can cause ankle pain, including an injury to the area and overuse of the ankle. Follow these instructions at home: Activity  Rest your ankle as told by your health care provider. Avoid any activities that cause ankle pain.  Do not use the injured limb to support your body weight until your health care provider says that you can. Use crutches as told by your health care provider.  Do exercises as told by your health care provider.  Ask your health care provider when it is safe to drive if you have a brace on your ankle. If you have a brace:  Wear the brace as told by your health care provider. Remove it only as told by your health care provider.  Loosen the brace if your toes tingle, become numb, or turn cold and blue.  Keep the brace clean.  If the brace is not waterproof: ? Do not let it get wet. ? Cover it with a watertight covering when you take a bath or shower. If you were given an elastic bandage:   Remove it when you take a bath or a shower.  Try not to move your ankle very much, but wiggle your toes from time to time. This helps to prevent swelling.  Adjust the bandage to make it more comfortable if it feels too tight.  Loosen the bandage if you have numbness or tingling in your foot or if your foot turns cold and blue. Managing pain, stiffness, and swelling   If directed, put ice on the painful  area. ? If you have a removable brace or elastic bandage, remove it as told by your health care provider. ? Put ice in a plastic bag. ? Place a towel between your skin and the bag. ? Leave the ice on for 20 minutes, 2-3 times a day.  Move your toes often to avoid stiffness and to lessen swelling.  Raise (elevate) your ankle above the level of your heart while you are sitting or lying down. General instructions  Record information about your pain. Writing down the following may be helpful for you and your health care provider: ? How often you have ankle pain. ? Where the pain is located. ? What the pain feels like.  If treatment involves wearing a prescribed shoe or insole, make sure you wear it correctly and for as long as told by your health care provider.  Take over-the-counter and prescription medicines only as told by your health care provider.  Keep all follow-up visits as told by your health care provider. This is important. Contact a health care provider if:  Your pain gets worse.  Your pain is not relieved with medicines.  You have a fever or chills.  You are having more trouble with walking.  You have new symptoms. Get help right away if:  Your foot, leg, toes, or ankle: ? Tingles or becomes numb. ? Becomes  swollen. ? Turns pale or blue. Summary  Ankle pain can occur on either side or the back of one ankle or both ankles.  Ankle pain may be sharp and burning or dull and aching.  Rest your ankle as told by your health care provider. If told, apply ice to the area.  Take over-the-counter and prescription medicines only as told by your health care provider. This information is not intended to replace advice given to you by your health care provider. Make sure you discuss any questions you have with your health care provider. Document Revised: 09/10/2018 Document Reviewed: 11/28/2017 Elsevier Patient Education  [place].

## 2020-04-28 NOTE — Progress Notes (Signed)
Established patient visit   Patient: Elizabeth Bowers   DOB: 03-19-1966   54 y.o. Female  MRN: [id number] Visit Date: 04/28/2020  Today's healthcare provider: Marcille Buffy, FNP   Chief Complaint  Patient presents with  . Ankle Pain   Subjective    Ankle Pain  There was no injury mechanism. The pain is present in the right ankle. The pain is at a severity of 9/10. The pain has been fluctuating since onset. Pertinent negatives include no inability to bear weight, loss of motion, loss of sensation, muscle weakness, numbness or tingling. She reports no foreign bodies present. The symptoms are aggravated by weight bearing (walking). She has tried NSAIDs for the symptoms. The treatment provided mild relief.   Motrin helping some.  Not relieving pain.   She reports she may have twisted it a few months back. Denies any surgery. Mild swelliung in  Patient  denies any fever, body, chills, rash, chest pain, shortness of breath, nausea, vomiting, or diarrhea.  Denies dizziness, lightheadedness, pre syncopal or syncopal episodes.   Patient Active Problem List   Diagnosis Date Noted  . Anterior tibialis tendinitis, right 04/28/2020  . Acute right ankle pain 04/28/2020  . Encounter for colonoscopy due to history of adenomatous colonic polyps   . Insomnia 07/07/2019  . Chronic low back pain (Bilateral) w/o sciatica 08/08/2018  . Abnormal MRI, cervical spine (07/10/2018) 07/17/2018  . Cervical spondylitis with radiculitis (HCC) (C6) (Bilateral) (R>L) 06/26/2018  . Chronic sacroiliac joint pain (Bilateral) (R>L) 06/26/2018  . Cervical Grade 1 (2 mm) Retrolisthesis C5 over C6 03/20/2018  . Cervical foraminal stenosis (C3-4 & C5-6) (Bilateral) 03/20/2018  . DDD (degenerative disc disease), cervical 03/20/2018  . Long term prescription benzodiazepine use 03/20/2018  . CKD (chronic kidney disease) stage 3, GFR 30-59 ml/min ([place]) 03/20/2018  . Acid reflux disease 03/20/2018  .  Spondylosis without myelopathy or radiculopathy, cervical region 03/20/2018  . Chronic hip pain (Bilateral) (L>R) 03/20/2018  . Lumbar facet syndrome (Bilateral) (R>L) 03/20/2018  . Spondylosis without myelopathy or radiculopathy, lumbar region 03/20/2018  . Chronic Sacroiliac joint dysfunction (Bilateral) (R>L) 03/20/2018  . Chronic Somatic dysfunction of sacroiliac joint (Bilateral) (R>L) 03/20/2018  . Osteoarthritis of hip (Bilateral) 03/20/2018  . Cervicogenic headache (Bilateral) 03/20/2018  . Cervicalgia (Bilateral) (R>L) 03/20/2018  . Cervical facet syndrome (Bilateral) (R>L) 03/20/2018  . Chronic occipital neuralgia (Bilateral) 03/20/2018  . Chronic upper extremity pain (Fourth Area of Pain) (Bilateral) (R>L) 03/19/2018  . Ganglion cyst 02/20/2018  . Chronic low back pain (Secondary Area of Pain) (Bilateral) (L>R) w/ sciatica (Bilateral) 01/16/2018  . Chronic lower extremity pain [facility] Area of Pain) (Bilateral) (L>R) 01/16/2018  . Chronic neck pain (Primary Area of Pain) (Bilateral) (midline) 01/16/2018  . Chronic foot pain (Left) 01/16/2018  . Chronic pain syndrome 01/16/2018  . Pharmacologic therapy 01/16/2018  . Disorder of skeletal system 01/16/2018  . Problems influencing health status 01/16/2018  . Obesity 11/21/2017  . Anxiety 09/28/2017  . Chronic knee pain (Right) 09/28/2017  . Fatty liver 07/09/2017  . Colon polyps 07/09/2017  . Fibromyalgia 07/09/2017  . Sinusitis, chronic 07/09/2017  . Allergic rhinitis 07/09/2017  . IBS (irritable bowel syndrome) 07/09/2017  . Eyelid dermatitis, allergic/contact 07/09/2017  . Hypertension 01/12/2017  . Superficial thrombophlebitis 01/12/2017  . Chronic sacroiliac joint pain (Right) 12/16/2016  . DDD (degenerative disc disease), lumbar 07/18/2016  . Family hx of colon cancer 07/18/2016   Past Medical History:  Diagnosis Date  . Fatty liver   .  Fibromyalgia   . GERD (gastroesophageal reflux disease)   . Hemorrhoids    . Hot flashes   . Hypertension   . IBS (irritable bowel syndrome)   . LUQ abdominal pain 10/25/2016  . Migraines   . Miscarriage    x 2; 4 live births   . Multilevel degenerative disc disease   . Vocal cord dysfunction        Medications: Outpatient Medications Prior to Visit  Medication Sig  . DULoxetine (CYMBALTA) 60 MG capsule Take 1 capsule (60 mg total) by mouth daily.  . lansoprazole (PREVACID) 30 MG capsule Take 1 capsule (30 mg total) by mouth 2 (two) times daily before a meal.  . LINZESS 290 MCG CAPS capsule Take 1 capsule (290 mcg total) by mouth daily.  . Multiple Vitamin (MULTIVITAMIN WITH MINERALS) TABS tablet Take 1 tablet by mouth daily.  . psyllium (METAMUCIL) 58.6 % powder Take 2 packets by mouth daily.  . SUMAtriptan (IMITREX) 100 MG tablet Take 100 mg by mouth every 2 (two) hours as needed for migraine. May repeat in 2 hours if headache persists or recurs.  . triamterene-hydrochlorothiazide (MAXZIDE-25) 37.5-25 MG tablet Take 1 tablet by mouth daily.  . vitamin C (ASCORBIC ACID) 500 MG tablet Take 500 mg by mouth daily.  . fluticasone (FLONASE) 50 MCG/ACT nasal spray Place 1 spray into both nostrils 2 (two) times daily. (Patient not taking: Reported on 04/28/2020)  . levocetirizine (XYZAL) 5 MG tablet Take 1 tablet (5 mg total) by mouth every evening. (Patient not taking: Reported on 04/28/2020)   No facility-administered medications prior to visit.    Review of Systems  Neurological: Negative for tingling and numbness.   Last CBC Lab Results  Component Value Date   WBC 7.1 11/26/2017   HGB 14.6 11/26/2017   HCT 42.6 11/26/2017   MCV 88 11/26/2017   MCH 30.0 11/26/2017   RDW 13.5 11/26/2017   PLT 303 53/66/4403   Last metabolic panel Lab Results  Component Value Date   GLUCOSE 112 (H) 03/24/2020   NA 141 03/24/2020   K 3.8 03/24/2020   CL 105 03/24/2020   CO2 26 03/24/2020   BUN 14 03/24/2020   CREATININE 1.01 (H) 03/24/2020   GFRNONAA >60  03/24/2020   GFRAA >60 07/07/2019   CALCIUM 9.5 03/24/2020   PROT 7.2 03/24/2020   ALBUMIN 4.3 03/24/2020   LABGLOB 2.0 01/02/2019   AGRATIO 2.2 01/02/2019   BILITOT 0.7 03/24/2020   ALKPHOS 88 03/24/2020   AST 39 03/24/2020   ALT 57 (H) 03/24/2020   ANIONGAP 10 03/24/2020        Objective    BP (!) 153/93   Pulse 75   Temp 98.4 F (36.9 C) (Oral)   Resp 16   Wt 188 lb (85.3 kg)   SpO2 98%   BMI 34.39 kg/m  BP Readings from Last 3 Encounters:  04/28/20 (!) 153/93  03/27/20 (!) 159/94  02/02/20 134/81   Wt Readings from Last 3 Encounters:  04/28/20 188 lb (85.3 kg)  03/27/20 183 lb (83 kg)  02/02/20 183 lb (83 kg)      Physical Exam Constitutional:      General: She is not in acute distress.    Appearance: Normal appearance. She is not ill-appearing, toxic-appearing or diaphoretic.  HENT:     Head: Normocephalic and atraumatic.  Cardiovascular:     Rate and Rhythm: Normal rate and regular rhythm.  Pulmonary:     Effort: Pulmonary effort is  normal.  Abdominal:     Palpations: Abdomen is soft.  Musculoskeletal:        General: Swelling present. No deformity.     Right lower leg: Tenderness present. Edema present.     Right ankle: No swelling, deformity, ecchymosis or lacerations. Tenderness present over the medial malleolus and AITF ligament (swelling is moderate, mild bruising, no erythema or warmth. ). No lateral malleolus tenderness. Decreased range of motion. Normal pulse.     Right Achilles Tendon: Normal.     Left ankle: Normal pulse.     Left Achilles Tendon: Normal.       Legs:  Neurological:     Gait: Gait abnormal (limp ).  Psychiatric:        Mood and Affect: Mood normal.        Behavior: Behavior normal.        Thought Content: Thought content normal.        Judgment: Judgment normal.       No results found for any visits on 04/28/20.  Assessment & Plan     Acute right ankle pain - Plan: Ambulatory referral to Orthopedic  Surgery  Anterior tibialis tendinitis, right   Will go to [facility] for evaluation walk in at one o'clock today given severity of pain, swelling, gait disturbance. Will defer imaging to [facility] since they prefer in house x rays.    Meds ordered this encounter  Medications  . traMADol (ULTRAM) 50 MG tablet    Sig: Take 1 tablet (50 mg total) by mouth every 8 (eight) hours as needed for up to 5 days.    Dispense:  15 tablet    Refill:  0  only for as needed moderate to sever pain.  Work note offered, declined at this time, can give if she calls office.   Red Flags discussed. The patient was given clear instructions to go to ER or return to medical center if any red flags develop, symptoms do not improve, worsen or new problems develop. They verbalized understanding.   Return in about 1 week (around 05/05/2020), or if symptoms worsen or fail to improve.         Marcille Buffy, [place] [phone] (phone) [phone] (fax)  [place]

## 2020-05-04 ENCOUNTER — Other Ambulatory Visit: Payer: Self-pay | Admitting: Family Medicine

## 2020-05-04 DIAGNOSIS — Z1231 Encounter for screening mammogram for malignant neoplasm of breast: Secondary | ICD-10-CM

## 2020-05-11 ENCOUNTER — Other Ambulatory Visit (HOSPITAL_COMMUNITY): Payer: Self-pay | Admitting: Family Medicine

## 2020-05-20 ENCOUNTER — Encounter: Payer: Self-pay | Admitting: Family Medicine

## 2020-05-20 NOTE — Progress Notes (Signed)
Acute Office Visit  Subjective:    Patient ID: Elizabeth Bowers, female    DOB: 1966-04-20, 54 y.o.   MRN: [id number]  No chief complaint on file.   HPI Patient is in today for elevated blood pressure recently.  She states she has history of hypertension and takes Triamterene HCTZ.  She reports good compliance.  She has reading in her home monitor and they have ranged in the 037'C-488 systolic and 89'V-694 diastolic.   She denies difficulty breathing or edema.  She reports having an episode of chest pain for about 30 minutes with no associated symptoms of pain radiation, numbness or tingling.    Patient checked her blood pressure in the office with her monitor and got a reading of 143/124.  Past Medical History:  Diagnosis Date  . Fatty liver   . Fibromyalgia   . GERD (gastroesophageal reflux disease)   . Hemorrhoids   . Hot flashes   . Hypertension   . IBS (irritable bowel syndrome)   . LUQ abdominal pain 10/25/2016  . Migraines   . Miscarriage    x 2; 4 live births   . Multilevel degenerative disc disease   . Vocal cord dysfunction     Past Surgical History:  Procedure Laterality Date  . ABDOMINAL HYSTERECTOMY     2012 removed cervix h/o abnormal pap   . CHOLECYSTECTOMY     2000  . COLONOSCOPY     2018 with + polpys and GIB 2/2 polyp removal   . COLONOSCOPY WITH PROPOFOL N/A 12/22/2019   Procedure: COLONOSCOPY WITH PROPOFOL;  Surgeon: Lin Landsman, MD;  Location: [facility];  Service: Gastroenterology;  Laterality: N/A;  . COLONOSCOPY WITH PROPOFOL N/A 12/23/2019   Procedure: COLONOSCOPY WITH PROPOFOL;  Surgeon: Lin Landsman, MD;  Location[facility] ENDOSCOPY;  Service: Gastroenterology;  Laterality: N/A;  Last name pronounced LEE-MA  . ELBOW DEBRIDEMENT    . ESOPHAGOGASTRODUODENOSCOPY (EGD) WITH PROPOFOL N/A 02/06/2018   Procedure: ESOPHAGOGASTRODUODENOSCOPY (EGD) WITH PROPOFOL with biopsies;  Surgeon: Lin Landsman, MD;  Location: [place];   Service: Endoscopy;  Laterality: N/A;  . FOOT SURGERY      Family History  Problem Relation Age of Onset  . Hypertension Mother   . Hyperlipidemia Mother   . Glaucoma Mother   . Diabetes Mother        type 2   . COPD Father        emphysema  . Alcohol abuse Father   . Colon cancer Sister   . Cancer Sister        rectal/colon cancer no h/o IBD  . Colon polyps Sister   . Asthma Sister   . COPD Sister   . Kidney disease Sister   . Mental illness Sister        bipolar   . Miscarriages / Stillbirths Sister   . Uterine cancer Sister   . Alcohol abuse Son   . Depression Son   . Diabetes Son   . Drug abuse Son   . Mental illness Son        bipolar   . Diabetes Maternal Grandmother   . COPD Paternal Grandmother   . Cancer Paternal Grandfather        FH colon cancer maternal aunts/uncles   . Miscarriages / Stillbirths Sister   . Arthritis Son   . Arthritis Son   . Breast cancer Neg Hx     Social History   Socioeconomic History  . Marital  status: Married    Spouse name: Kyung Rudd  . Number of children: 6  . Years of education: 24  . Highest education level: Associate degree: academic program  Occupational History  . Occupation: Respiratory Therapist    Employer: [employer]  Tobacco Use  . Smoking status: Former Smoker    Packs/day: 0.75    Years: 5.00    Pack years: 3.75    Types: Cigarettes    Quit date: 07/06/1987    Years since quitting: 32.8  . Smokeless tobacco: Never Used  Vaping Use  . Vaping Use: Never used  Substance and Sexual Activity  . Alcohol use: Yes    Alcohol/week: 2.0 - 4.0 standard drinks    Types: 2 - 4 Cans of beer per week    Comment: wine cooler  . Drug use: Never  . Sexual activity: Not on file  Other Topics Concern  . Not on file  Social History Narrative  . Not on file   Social Determinants of Health   Financial Resource Strain: Not on file  Food Insecurity: Not on file  Transportation Needs: Not on file  Physical Activity: Not on  file  Stress: Not on file  Social Connections: Not on file  Intimate Partner Violence: Not on file    Outpatient Medications Prior to Visit  Medication Sig Dispense Refill  . DULoxetine (CYMBALTA) 60 MG capsule Take 1 capsule (60 mg total) by mouth daily. 90 capsule 2  . Multiple Vitamin (MULTIVITAMIN WITH MINERALS) TABS tablet Take 1 tablet by mouth daily.    . psyllium (METAMUCIL) 58.6 % powder Take 2 packets by mouth daily.    . SUMAtriptan (IMITREX) 100 MG tablet Take 100 mg by mouth every 2 (two) hours as needed for migraine. May repeat in 2 hours if headache persists or recurs.    . triamterene-hydrochlorothiazide (MAXZIDE-25) 37.5-25 MG tablet Take 1 tablet by mouth daily. 90 tablet 1  . vitamin C (ASCORBIC ACID) 500 MG tablet Take 500 mg by mouth daily.    . fluticasone (FLONASE) 50 MCG/ACT nasal spray Place 1 spray into both nostrils 2 (two) times daily. (Patient not taking: Reported on 04/28/2020) 16 g 2  . lansoprazole (PREVACID) 30 MG capsule Take 1 capsule (30 mg total) by mouth 2 (two) times daily before a meal. 180 capsule 3  . levocetirizine (XYZAL) 5 MG tablet Take 1 tablet (5 mg total) by mouth every evening. (Patient not taking: No sig reported) 30 tablet 2  . LINZESS 290 MCG CAPS capsule Take 1 capsule (290 mcg total) by mouth daily. 90 capsule 3   No facility-administered medications prior to visit.    Allergies  Allergen Reactions  . Gluten Meal     Other reaction(s): Abdominal Pain, Myalgias (Muscle Pain), Other (see comments) Other reaction(s): Other (See Comments)   . Lactose     Other reaction(s): Abdominal Pain, Myalgias (Muscle Pain), Other (see comments) Other reaction(s): Other (See Comments)   . Ranitidine Diarrhea and Other (See Comments)    Diarrhea and stomach cramping  . Amphetamine-Dextroamphetamine Other (See Comments)    Elevated BP  . Lactose Intolerance (Gi)     bloating  . Reglan [Metoclopramide] Other (See Comments)    Increased  prolactin level   . Topiramate   . Topiramate Er Other (See Comments)    Severe headache, nausea, vomiting, dizziness, visual disturbance    . Wheat Bran     bloating  . Soap Rash    PROVON excess use  .  Sulfa Antibiotics Anxiety    Review of Systems  Respiratory: Negative for cough, shortness of breath and wheezing.   Cardiovascular: Positive for chest pain. Negative for palpitations and leg swelling.  Neurological: Positive for headaches (has history of chronic headaches). Negative for dizziness.       Objective:    Physical Exam Constitutional:      General: She is not in acute distress.    Appearance: She is well-developed and well-nourished.  HENT:     Head: Normocephalic and atraumatic.     Right Ear: Hearing and tympanic membrane normal.     Left Ear: Hearing and tympanic membrane normal.     Nose: Nose normal.  Eyes:     General: Lids are normal. No scleral icterus.       Right eye: No discharge.        Left eye: No discharge.     Conjunctiva/sclera: Conjunctivae normal.  Cardiovascular:     Rate and Rhythm: Normal rate and regular rhythm.     Pulses: Normal pulses.     Heart sounds: Normal heart sounds.  Pulmonary:     Effort: Pulmonary effort is normal. No respiratory distress.     Breath sounds: Normal breath sounds.  Abdominal:     General: Bowel sounds are normal.     Palpations: Abdomen is soft.  Musculoskeletal:        General: Normal range of motion.     Cervical back: Neck supple.  Skin:    General: Skin is intact.     Findings: No lesion or rash.  Neurological:     Mental Status: She is alert and oriented to person, place, and time.  Psychiatric:        Mood and Affect: Mood and affect normal.        Speech: Speech normal.        Behavior: Behavior normal.        Thought Content: Thought content normal.     BP (!) 147/87 (BP Location: Right Arm, Patient Position: Sitting, Cuff Size: Normal)   Pulse 78   Temp 98.8 F (37.1 C) (Oral)    Wt 187 lb (84.8 kg)   SpO2 98%   BMI 34.20 kg/m  Wt Readings from Last 3 Encounters:  05/21/20 187 lb (84.8 kg)  04/28/20 188 lb (85.3 kg)  03/27/20 183 lb (83 kg)    Health Maintenance Due  Topic Date Due  . Hepatitis C Screening  Never done  . COVID-19 Vaccine (1) Never done    There are no preventive care reminders to display for this patient.   No results found for: TSH Lab Results  Component Value Date   WBC 7.1 11/26/2017   HGB 14.6 11/26/2017   HCT 42.6 11/26/2017   MCV 88 11/26/2017   PLT 303 11/26/2017   Lab Results  Component Value Date   NA 141 03/24/2020   K 3.8 03/24/2020   CO2 26 03/24/2020   GLUCOSE 112 (H) 03/24/2020   BUN 14 03/24/2020   CREATININE 1.01 (H) 03/24/2020   BILITOT 0.7 03/24/2020   ALKPHOS 88 03/24/2020   AST 39 03/24/2020   ALT 57 (H) 03/24/2020   PROT 7.2 03/24/2020   ALBUMIN 4.3 03/24/2020   CALCIUM 9.5 03/24/2020   ANIONGAP 10 03/24/2020   Lab Results  Component Value Date   CHOL 112 03/24/2020   Lab Results  Component Value Date   HDL 45 03/24/2020   Lab Results  Component Value Date  LDLCALC 54 03/24/2020   Lab Results  Component Value Date   TRIG 64 03/24/2020   Lab Results  Component Value Date   CHOLHDL 2.5 03/24/2020   Lab Results  Component Value Date   HGBA1C 5.1 01/02/2019       Assessment & Plan:   1. Hypertension, unspecified type Complains of BP fluctuations with any pain. Trying to restrict salt in diet and caffeine consumption. Still taking Dyazide 25 mg qd without side effects. No muscle cramps. Notices high elevations of BP if any problems with pain such as migraines. Continue present medication and given a trial of Amlodipine with the Dyazide. Recheck follow up with Dr. Brita Romp on 06-07-20.  - amLODipine (NORVASC) 2.5 MG tablet; Take 1 tablet (2.5 mg total) by mouth daily.  Dispense: 30 tablet; Refill: 3   No orders of the defined types were placed in this encounter.  I, Thoren Hosang, PA-C, have reviewed all documentation for this visit. The documentation on 05/21/20 for the exam, diagnosis, procedures, and orders are all accurate and complete.   Juluis Mire, CMA

## 2020-05-21 ENCOUNTER — Other Ambulatory Visit: Payer: Self-pay

## 2020-05-21 ENCOUNTER — Encounter: Payer: Self-pay | Admitting: Family Medicine

## 2020-05-21 ENCOUNTER — Ambulatory Visit (INDEPENDENT_AMBULATORY_CARE_PROVIDER_SITE_OTHER): Payer: 59 | Admitting: Family Medicine

## 2020-05-21 VITALS — BP 147/87 | HR 78 | Temp 98.8°F | Wt 187.0 lb

## 2020-05-21 DIAGNOSIS — I10 Essential (primary) hypertension: Secondary | ICD-10-CM | POA: Diagnosis not present

## 2020-05-21 MED ORDER — AMLODIPINE BESYLATE 2.5 MG PO TABS: 2.5000 mg | ORAL_TABLET | Freq: Every day | ORAL | 3 refills | Status: DC

## 2020-05-24 ENCOUNTER — Other Ambulatory Visit: Payer: Self-pay | Admitting: Specialist

## 2020-05-24 DIAGNOSIS — M76811 Anterior tibial syndrome, right leg: Secondary | ICD-10-CM | POA: Diagnosis not present

## 2020-05-24 MED FILL — DULOXETINE HCL 60 MG CPEP: 60 | 90 days supply | Qty: 90 | Fill #0

## 2020-05-26 ENCOUNTER — Other Ambulatory Visit: Payer: Self-pay

## 2020-05-26 ENCOUNTER — Ambulatory Visit
Admission: RE | Admit: 2020-05-26 | Discharge: 2020-05-26 | Disposition: A | Discharge status: Home / Self Care | Payer: 59 | Source: Ambulatory Visit | Attending: Family Medicine | Admitting: Family Medicine

## 2020-05-26 DIAGNOSIS — Z1231 Encounter for screening mammogram for malignant neoplasm of breast: Secondary | ICD-10-CM | POA: Diagnosis not present

## 2020-05-27 NOTE — Patient Instructions (Addendum)
The CDC recommends two doses of Shingrix (the shingles vaccine) separated by 2 to 6 months for adults age 55 years and older. I recommend checking with your insurance plan regarding coverage for this vaccine.      Preventive Care 35-87 Years Old, Female Preventive care refers to visits with your health care provider and lifestyle choices that can promote health and wellness. This includes:  A yearly physical exam. This may also be called an annual well check.  Regular dental visits and eye exams.  Immunizations.  Screening for certain conditions.  Healthy lifestyle choices, such as eating a healthy diet, getting regular exercise, not using drugs or products that contain nicotine and tobacco, and limiting alcohol use. What can I expect for my preventive care visit? Physical exam Your health care provider will check your:  Height and weight. This may be used to calculate body mass index (BMI), which tells if you are at a healthy weight.  Heart rate and blood pressure.  Skin for abnormal spots. Counseling Your health care provider may ask you questions about your:  Alcohol, tobacco, and drug use.  Emotional well-being.  Home and relationship well-being.  Sexual activity.  Eating habits.  Work and work Statistician.  Method of birth control.  Menstrual cycle.  Pregnancy history. What immunizations do I need?  Influenza (flu) vaccine  This is recommended every year. Tetanus, diphtheria, and pertussis (Tdap) vaccine  You may need a Td booster every 10 years. Varicella (chickenpox) vaccine  You may need this if you have not been vaccinated. Zoster (shingles) vaccine  You may need this after age 72. Measles, mumps, and rubella (MMR) vaccine  You may need at least one dose of MMR if you were born in 1957 or later. You may also need a second dose. Pneumococcal conjugate (PCV13) vaccine  You may need this if you have certain conditions and were not previously  vaccinated. Pneumococcal polysaccharide (PPSV23) vaccine  You may need one or two doses if you smoke cigarettes or if you have certain conditions. Meningococcal conjugate (MenACWY) vaccine  You may need this if you have certain conditions. Hepatitis A vaccine  You may need this if you have certain conditions or if you travel or work in places where you may be exposed to hepatitis A. Hepatitis B vaccine  You may need this if you have certain conditions or if you travel or work in places where you may be exposed to hepatitis B. Haemophilus influenzae type b (Hib) vaccine  You may need this if you have certain conditions. Human papillomavirus (HPV) vaccine  If recommended by your health care provider, you may need three doses over 6 months. You may receive vaccines as individual doses or as more than one vaccine together in one shot (combination vaccines). Talk with your health care provider about the risks and benefits of combination vaccines. What tests do I need? Blood tests  Lipid and cholesterol levels. These may be checked every 5 years, or more frequently if you are over 48 years old.  Hepatitis C test.  Hepatitis B test. Screening  Lung cancer screening. You may have this screening every year starting at age 29 if you have a 30-pack-year history of smoking and currently smoke or have quit within the past 15 years.  Colorectal cancer screening. All adults should have this screening starting at age 71 and continuing until age 51. Your health care provider may recommend screening at age 51 if you are at increased risk. You  will have tests every 1-10 years, depending on your results and the type of screening test.  Diabetes screening. This is done by checking your blood sugar (glucose) after you have not eaten for a while (fasting). You may have this done every 1-3 years.  Mammogram. This may be done every 1-2 years. Talk with your health care provider about when you should start  having regular mammograms. This may depend on whether you have a family history of breast cancer.  BRCA-related cancer screening. This may be done if you have a family history of breast, ovarian, tubal, or peritoneal cancers.  Pelvic exam and Pap test. This may be done every 3 years starting at age 40. Starting at age 53, this may be done every 5 years if you have a Pap test in combination with an HPV test. Other tests  Sexually transmitted disease (STD) testing.  Bone density scan. This is done to screen for osteoporosis. You may have this scan if you are at high risk for osteoporosis. Follow these instructions at home: Eating and drinking  Eat a diet that includes fresh fruits and vegetables, whole grains, lean protein, and low-fat dairy.  Take vitamin and mineral supplements as recommended by your health care provider.  Do not drink alcohol if: ? Your health care provider tells you not to drink. ? You are pregnant, may be pregnant, or are planning to become pregnant.  If you drink alcohol: ? Limit how much you have to 0-1 drink a day. ? Be aware of how much alcohol is in your drink. In the [place] one drink equals one 12 oz bottle of beer (355 mL), one 5 oz glass of wine (148 mL), or one 1 oz glass of hard liquor (44 mL). Lifestyle  Take daily care of your teeth and gums.  Stay active. Exercise for at least 30 minutes on 5 or more days each week.  Do not use any products that contain nicotine or tobacco, such as cigarettes, e-cigarettes, and chewing tobacco. If you need help quitting, ask your health care provider.  If you are sexually active, practice safe sex. Use a condom or other form of birth control (contraception) in order to prevent pregnancy and STIs (sexually transmitted infections).  If told by your health care provider, take low-dose aspirin daily starting at age 5. What's next?  Visit your health care provider once a year for a well check visit.  Ask your health  care provider how often you should have your eyes and teeth checked.  Stay up to date on all vaccines. This information is not intended to replace advice given to you by your health care provider. Make sure you discuss any questions you have with your health care provider. Document Revised: 01/31/2018 Document Reviewed: 01/31/2018 Elsevier Patient Education  2020 [employer].

## 2020-06-07 ENCOUNTER — Other Ambulatory Visit: Payer: Self-pay | Admitting: Family Medicine

## 2020-06-07 ENCOUNTER — Ambulatory Visit (INDEPENDENT_AMBULATORY_CARE_PROVIDER_SITE_OTHER): Payer: 59 | Admitting: Family Medicine

## 2020-06-07 ENCOUNTER — Other Ambulatory Visit: Payer: Self-pay

## 2020-06-07 ENCOUNTER — Encounter: Payer: Self-pay | Admitting: Family Medicine

## 2020-06-07 VITALS — BP 134/78 | HR 65 | Temp 99.1°F | Resp 16 | Wt 185.0 lb

## 2020-06-07 DIAGNOSIS — E669 Obesity, unspecified: Secondary | ICD-10-CM

## 2020-06-07 DIAGNOSIS — I10 Essential (primary) hypertension: Secondary | ICD-10-CM | POA: Diagnosis not present

## 2020-06-07 DIAGNOSIS — Z6833 Body mass index (BMI) 33.0-33.9, adult: Secondary | ICD-10-CM

## 2020-06-07 DIAGNOSIS — Z0000 Encounter for general adult medical examination without abnormal findings: Secondary | ICD-10-CM | POA: Diagnosis not present

## 2020-06-07 DIAGNOSIS — G4700 Insomnia, unspecified: Secondary | ICD-10-CM | POA: Diagnosis not present

## 2020-06-07 DIAGNOSIS — Z1159 Encounter for screening for other viral diseases: Secondary | ICD-10-CM

## 2020-06-07 DIAGNOSIS — Z23 Encounter for immunization: Secondary | ICD-10-CM

## 2020-06-07 DIAGNOSIS — N1831 Chronic kidney disease, stage 3a: Secondary | ICD-10-CM

## 2020-06-07 DIAGNOSIS — M797 Fibromyalgia: Secondary | ICD-10-CM

## 2020-06-07 DIAGNOSIS — F419 Anxiety disorder, unspecified: Secondary | ICD-10-CM | POA: Diagnosis not present

## 2020-06-07 DIAGNOSIS — K760 Fatty (change of) liver, not elsewhere classified: Secondary | ICD-10-CM

## 2020-06-07 MED ORDER — BUPROPION HCL ER (XL) 150 MG PO TB24: 150.0000 mg | ORAL_TABLET | Freq: Every day | ORAL | 5 refills | Status: DC

## 2020-06-07 MED FILL — buPROPion HCL ER (XL) 150 M: 150 | 30 days supply | Qty: 30 | Fill #0

## 2020-06-07 NOTE — Assessment & Plan Note (Signed)
Likely related to stress and anxiety Nocturia, likely related to polydipsia and diuretic. Counseled on drinking to thirst, good sleep hygeine

## 2020-06-07 NOTE — Assessment & Plan Note (Signed)
Chronic and stable Continue to monitor 

## 2020-06-07 NOTE — Assessment & Plan Note (Signed)
Discussed importance of healthy weight management Discussed diet and exercise Pt. Is trying new intermittent fasting routine

## 2020-06-07 NOTE — Assessment & Plan Note (Signed)
UTD on Mammogram and colonoscopy Shingles Shot today Hep C screening in 3 months when other labs are due

## 2020-06-07 NOTE — Assessment & Plan Note (Signed)
Previously Well controlled 134/78 today Recently started fasting diet with 11 8oz glasses of water daily Continue current medications Recheck CMP in 3 months

## 2020-06-07 NOTE — Progress Notes (Signed)
Complete physical exam   Patient: Elizabeth Bowers   DOB: August 23, 1965   55 y.o. Female  MRN: [id number] Visit Date: 06/07/2020  Today's healthcare provider: Lavon Paganini, MD   Chief Complaint  Patient presents with  . Annual Exam   Subjective    Elizabeth Bowers is a 55 y.o. female who presents today for a complete physical exam.  She reports consuming a low sodium diet. The patient does not participate in regular exercise at present. She generally feels fairly well. She reports sleeping poorly. She does have additional problems to discuss today.  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 She occassionally has thoughts of what it would be like if she wasn't alive. She notes they are never with a plan and more around her coping with her challenging environment.  Regarding diet exercise and sleep. She is using an app called fastic that moderates intermittent fasting and encourages frequent drinking. It has been interfering with her sleep some, as she has been waking up every two hours, to urinate.    Past Medical History:  Diagnosis Date  . Fatty liver   . Fibromyalgia   . GERD (gastroesophageal reflux disease)   . Hemorrhoids   . Hot flashes   . Hypertension   . IBS (irritable bowel syndrome)   . LUQ abdominal pain 10/25/2016  . Migraines   . Miscarriage    x 2; 4 live births   . Multilevel degenerative disc disease   . Vocal cord dysfunction    Past Surgical History:  Procedure Laterality Date  . ABDOMINAL HYSTERECTOMY     2012 removed cervix h/o abnormal pap   . CHOLECYSTECTOMY     2000  . COLONOSCOPY     2018 with + polpys and GIB 2/2 polyp removal   . COLONOSCOPY WITH PROPOFOL N/A 12/22/2019   Procedure: COLONOSCOPY WITH PROPOFOL;  Surgeon: Lin Landsman, MD;  Location: [place];  Service: Gastroenterology;  Laterality:  N/A;  . COLONOSCOPY WITH PROPOFOL N/A 12/23/2019   Procedure: COLONOSCOPY WITH PROPOFOL;  Surgeon: Lin Landsman, MD;  Location: [facility] ENDOSCOPY;  Service: Gastroenterology;  Laterality: N/A;  Last name pronounced LEE-MA  . ELBOW DEBRIDEMENT    . ESOPHAGOGASTRODUODENOSCOPY (EGD) WITH PROPOFOL N/A 02/06/2018   Procedure: ESOPHAGOGASTRODUODENOSCOPY (EGD) WITH PROPOFOL with biopsies;  Surgeon: Lin Landsman, MD;  Location: [place];  Service: Endoscopy;  Laterality: N/A;  . FOOT SURGERY     Social History   Socioeconomic History  . Marital status: Married    Spouse name: Elizabeth Bowers  . Number of children: 6  . Years of education: 60  . Highest education level: Associate degree: academic program  Occupational History  . Occupation: Respiratory Therapist    Employer: [employer]  Tobacco Use  . Smoking status: Former Smoker    Packs/day: 0.75    Years: 5.00    Pack years: 3.75    Types: Cigarettes    Quit date: 07/06/1987    Years since quitting: 32.9  . Smokeless tobacco: Never Used  Vaping Use  . Vaping Use: Never used  Substance and Sexual Activity  . Alcohol use: Yes    Alcohol/week: 2.0 - 4.0 standard drinks    Types: 2 - 4 Cans of beer per week    Comment: wine cooler  . Drug use: Never  . Sexual  activity: Not on file  Other Topics Concern  . Not on file  Social History Narrative  . Not on file   Social Determinants of Health   Financial Resource Strain: Not on file  Food Insecurity: Not on file  Transportation Needs: Not on file  Physical Activity: Not on file  Stress: Not on file  Social Connections: Not on file  Intimate Partner Violence: Not on file   Family Status  Relation Name Status  . Mother  Alive  . Father  Deceased  . Sister  Deceased  . Sister  Alive  . Brother  Alive  . Daughter  Alive  . Son [employer]  . MGM  Deceased  . PGM  (Not Specified)  . PGF  (Not Specified)  . Sister  Alive  . Son  Alive  . Son  (Not Specified)  . Neg  Hx  (Not Specified)   Family History  Problem Relation Age of Onset  . Hypertension Mother   . Hyperlipidemia Mother   . Glaucoma Mother   . Diabetes Mother        type 2   . COPD Father        emphysema  . Alcohol abuse Father   . Colon cancer Sister   . Cancer Sister        rectal/colon cancer no h/o IBD  . Colon polyps Sister   . Asthma Sister   . COPD Sister   . Kidney disease Sister   . Mental illness Sister        bipolar   . Miscarriages / Stillbirths Sister   . Uterine cancer Sister   . Alcohol abuse Son   . Depression Son   . Diabetes Son   . Drug abuse Son   . Mental illness Son        bipolar   . Diabetes Maternal Grandmother   . COPD Paternal Grandmother   . Cancer Paternal Grandfather        FH colon cancer maternal aunts/uncles   . Miscarriages / Stillbirths Sister   . Arthritis Son   . Arthritis Son   . Breast cancer Neg Hx    Allergies  Allergen Reactions  . Gluten Meal     Other reaction(s): Abdominal Pain, Myalgias (Muscle Pain), Other (see comments) Other reaction(s): Other (See Comments)   . Lactose     Other reaction(s): Abdominal Pain, Myalgias (Muscle Pain), Other (see comments) Other reaction(s): Other (See Comments)   . Ranitidine Diarrhea and Other (See Comments)    Diarrhea and stomach cramping  . Amphetamine-Dextroamphetamine Other (See Comments)    Elevated BP  . Lactose Intolerance (Gi)     bloating  . Reglan [Metoclopramide] Other (See Comments)    Increased prolactin level   . Topiramate   . Topiramate Er Other (See Comments)    Severe headache, nausea, vomiting, dizziness, visual disturbance    . Wheat Bran     bloating  . Soap Rash    PROVON excess use  . Sulfa Antibiotics Anxiety    Patient Care Team: Erasmo Downer, MD as PCP - General (Family Medicine)   Medications: Outpatient Medications Prior to Visit  Medication Sig  . amLODipine (NORVASC) 2.5 MG tablet Take 1 tablet (2.5 mg total) by mouth daily.   . DULoxetine (CYMBALTA) 60 MG capsule Take 1 capsule (60 mg total) by mouth daily.  . lansoprazole (PREVACID) 30 MG capsule Take 1 capsule (30 mg total) by mouth 2 (  two) times daily before a meal.  . LINZESS 290 MCG CAPS capsule Take 1 capsule (290 mcg total) by mouth daily.  . Menaquinone-7 (VITAMIN K2 PO) Take by mouth.  . Multiple Vitamin (MULTIVITAMIN WITH MINERALS) TABS tablet Take 1 tablet by mouth daily.  . psyllium (METAMUCIL) 58.6 % powder Take 2 packets by mouth daily.  . SUMAtriptan (IMITREX) 100 MG tablet Take 100 mg by mouth every 2 (two) hours as needed for migraine. May repeat in 2 hours if headache persists or recurs.  . triamterene-hydrochlorothiazide (MAXZIDE-25) 37.5-25 MG tablet Take 1 tablet by mouth daily.  . vitamin C (ASCORBIC ACID) 500 MG tablet Take 500 mg by mouth daily.  . [DISCONTINUED] fluticasone (FLONASE) 50 MCG/ACT nasal spray Place 1 spray into both nostrils 2 (two) times daily. (Patient not taking: Reported on 04/28/2020)  . [DISCONTINUED] levocetirizine (XYZAL) 5 MG tablet Take 1 tablet (5 mg total) by mouth every evening. (Patient not taking: No sig reported)   No facility-administered medications prior to visit.    Review of Systems  Constitutional: Positive for diaphoresis and fatigue.  HENT: Positive for congestion, postnasal drip and sinus pressure.   Eyes: Positive for discharge and itching.  Respiratory: Positive for cough.   Cardiovascular: Positive for chest pain.  Endocrine: Positive for polyuria.  Musculoskeletal: Positive for myalgias.  Allergic/Immunologic: Positive for food allergies.  Neurological: Positive for headaches.  Hematological: Bruises/bleeds easily.  Psychiatric/Behavioral: Positive for decreased concentration, dysphoric mood and sleep disturbance. The patient is nervous/anxious.       Objective    BP 134/78 (BP Location: Left Arm, Patient Position: Sitting, Cuff Size: Normal)   Pulse 65   Temp 99.1 F (37.3 C) (Oral)    Resp 16   Wt 185 lb (83.9 kg)   SpO2 96%   BMI 33.84 kg/m    Physical Exam  General: Well appearing, in NAD Lungs: CTA bilaterally Cards: RRR no murmurs rubs or gallops, no LE edema GI: Soft Non-tender Psych  Mood: "good"  Affect: Mood congruent  Conversation: Clear and coherent  Thought process: Logical and linear  Thought content: Passive SI, wondering 'if I didn't have to be alive' no plan or intention of ending own life.    Last depression screening scores PHQ 2/9 Scores 06/07/2020 02/02/2020 01/02/2019  PHQ - 2 Score 3 4 2   PHQ- 9 Score 18 17 9    Last fall risk screening Fall Risk  06/07/2020  Falls in the past year? 0  Number falls in past yr: 0  Injury with Fall? 0  Risk for fall due to : No Fall Risks  Follow up Falls evaluation completed   Last Audit-C alcohol use screening Alcohol Use Disorder Test (AUDIT) 06/07/2020  1. How often do you have a drink containing alcohol? 2  2. How many drinks containing alcohol do you have on a typical day when you are drinking? 0  3. How often do you have six or more drinks on one occasion? 0  AUDIT-C Score 2  4. How often during the last year have you found that you were not able to stop drinking once you had started? -  5. How often during the last year have you failed to do what was normally expected from you because of drinking? -  6. How often during the last year have you needed a first drink in the morning to get yourself going after a heavy drinking session? -  7. How often during the last year have you had  a feeling of guilt of remorse after drinking? -  8. How often during the last year have you been unable to remember what happened the night before because you had been drinking? -  9. Have you or someone else been injured as a result of your drinking? -  10. Has a relative or friend or a doctor or another health worker been concerned about your drinking or suggested you cut down? -  Alcohol Use Disorder Identification Test  Final Score (AUDIT) -  Alcohol Brief Interventions/Follow-up AUDIT Score <7 follow-up not indicated   A score of 3 or more in women, and 4 or more in men indicates increased risk for alcohol abuse, EXCEPT if all of the points are from question 1   No results found for any visits on 06/07/20.  Assessment & Plan    Routine Health Maintenance and Physical Exam  Exercise Activities and Dietary recommendations Goals   None     Immunization History  Administered Date(s) Administered  . Influenza,inj,Quad PF,6+ Mos 03/17/2019, 02/12/2020  . Influenza-Unspecified 02/14/2018  . PFIZER SARS-COV-2 Vaccination 06/30/2019, 07/22/2019, 03/19/2020  . Tdap 03/05/2017  . Zoster Recombinat (Shingrix) 06/07/2020    Health Maintenance  Topic Date Due  . Hepatitis C Screening  Never done  . MAMMOGRAM  05/26/2022  . COLONOSCOPY (Pts 45-56yrs Insurance coverage will need to be confirmed)  12/23/2022  . TETANUS/TDAP  03/06/2027  . INFLUENZA VACCINE  Completed  . COVID-19 Vaccine  Completed  . HIV Screening  Completed    Discussed health benefits of physical activity, and encouraged her to engage in regular exercise appropriate for her age and condition.  Problem List Items Addressed This Visit      Cardiovascular and Mediastinum   Essential hypertension    Previously Well controlled 134/78 today Recently started fasting diet with 11 8oz glasses of water daily Continue current medications Recheck CMP in 3 months        Digestive   Fatty liver    Discovered incidentally on CT scan Recheck FLP at next visit Counseled on healthy diet        Genitourinary   CKD (chronic kidney disease) stage 3, GFR 30-59 ml/min (HCC) (Chronic)    Chronic and stable Continue to monitor        Other   Fibromyalgia (Chronic)    Chronic and stable Continue Cymbalta at current dose Followed by pain management      Relevant Medications   buPROPion (WELLBUTRIN XL) 150 MG 24 hr tablet   Anxiety     Chronic Previously well controlled, exacerbated in the setting of 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 Is trying new intermittent fasting routine      Insomnia    Likely related to stress and anxiety Nocturia, likely related to polydipsia and diuretic. Counseled on drinking to thirst, good sleep hygeine      Encounter for annual physical exam - Primary    UTD on Mammogram and colonoscopy Shingles Shot today Hep C screening in 3 months when other labs are due       Other Visit Diagnoses    Encounter for hepatitis C screening test for low risk patient  Need for shingles vaccine       Relevant Orders   Varicella-zoster vaccine IM (Completed)       Return in about 3 months (around 09/05/2020) for MDD/GAD f/u, chronic disease f/u.     Patient seen along with [employer] student [facility]. I personally evaluated this patient along with the student, and verified all aspects of the history, physical exam, and medical decision making as documented by the student. I agree with the student's documentation and have made all necessary edits.  Bernis Schreur, [name], MD, MPH [facility]

## 2020-06-07 NOTE — Assessment & Plan Note (Signed)
Chronic and stable Continue Cymbalta at current dose Followed by pain management

## 2020-06-07 NOTE — Assessment & Plan Note (Signed)
Discovered incidentally on CT scan Recheck FLP at next visit Counseled on healthy diet

## 2020-06-07 NOTE — Assessment & Plan Note (Addendum)
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

## 2020-06-09 ENCOUNTER — Encounter: Payer: Self-pay | Admitting: Family Medicine

## 2020-06-14 MED FILL — AMLODIPINE BESYLATE 2.5 MG: 2.5 | 30 days supply | Qty: 30 | Fill #0

## 2020-06-14 MED FILL — TRIAMTERENE-HCTZ 37.5-25 MG: 37.5-25 | 90 days supply | Qty: 90 | Fill #0

## 2020-06-21 ENCOUNTER — Encounter: Payer: Self-pay | Admitting: Family Medicine

## 2020-06-22 NOTE — Telephone Encounter (Signed)
Offer a virtual visit

## 2020-06-29 ENCOUNTER — Encounter: Payer: Self-pay | Admitting: Family Medicine

## 2020-07-01 NOTE — Telephone Encounter (Signed)
Can we set her up for COVID test at our office this afternoon? Alternatively, health at work could test her.  If asymptomatic, since vaccinated, I would have her test on day 5 from his symptoms starting.

## 2020-07-02 NOTE — Telephone Encounter (Signed)
LMTCB

## 2020-07-02 NOTE — Telephone Encounter (Signed)
Pt called stating that she has already been tested again and is not needing to have another test done in office. Please advise.

## 2020-07-14 MED FILL — buPROPion HCL ER (XL) 150 M: 150 | 30 days supply | Qty: 30 | Fill #1

## 2020-07-15 MED FILL — AMLODIPINE BESYLATE 2.5 MG: 2.5 | 30 days supply | Qty: 30 | Fill #1

## 2020-08-02 MED FILL — LINZESS 290 MCG CAPSULE: 290 | 90 days supply | Qty: 90 | Fill #1

## 2020-08-02 MED FILL — LANSOPRAZOLE 30 MG CPDR: 30 | 90 days supply | Qty: 180 | Fill #1

## 2020-08-09 MED FILL — buPROPion HCL ER (XL) 150 M: 150 | 30 days supply | Qty: 30 | Fill #2

## 2020-08-09 MED FILL — AMLODIPINE 2.5 MG TABLET: 2.5 | 30 days supply | Qty: 30 | Fill #2

## 2020-09-03 ENCOUNTER — Other Ambulatory Visit: Payer: Self-pay | Admitting: Family Medicine

## 2020-09-03 DIAGNOSIS — I10 Essential (primary) hypertension: Secondary | ICD-10-CM

## 2020-09-03 MED FILL — AMLODIPINE 2.5 MG TABLET: 2.5 | 90 days supply | Qty: 90 | Fill #0

## 2020-09-03 MED FILL — buPROPion HCL ER (XL) 150 M: 150 | 30 days supply | Qty: 30 | Fill #3

## 2020-09-03 MED FILL — TRIAMTERENE-HCTZ 37.5-25 MG: 37.5-25 | 90 days supply | Qty: 90 | Fill #1

## 2020-09-07 ENCOUNTER — Other Ambulatory Visit: Payer: Self-pay

## 2020-09-30 NOTE — Patient Instructions (Signed)

## 2020-10-01 ENCOUNTER — Ambulatory Visit (INDEPENDENT_AMBULATORY_CARE_PROVIDER_SITE_OTHER): Payer: 59 | Admitting: Family Medicine

## 2020-10-01 ENCOUNTER — Other Ambulatory Visit (HOSPITAL_COMMUNITY): Payer: Self-pay

## 2020-10-01 ENCOUNTER — Encounter: Payer: Self-pay | Admitting: Family Medicine

## 2020-10-01 VITALS — BP 130/87 | HR 80 | Temp 98.8°F | Resp 16 | Ht 62.0 in | Wt 180.9 lb

## 2020-10-01 DIAGNOSIS — F329 Major depressive disorder, single episode, unspecified: Secondary | ICD-10-CM | POA: Insufficient documentation

## 2020-10-01 DIAGNOSIS — I10 Essential (primary) hypertension: Secondary | ICD-10-CM | POA: Diagnosis not present

## 2020-10-01 DIAGNOSIS — F419 Anxiety disorder, unspecified: Secondary | ICD-10-CM | POA: Diagnosis not present

## 2020-10-01 DIAGNOSIS — Z23 Encounter for immunization: Secondary | ICD-10-CM | POA: Diagnosis not present

## 2020-10-01 DIAGNOSIS — F331 Major depressive disorder, recurrent, moderate: Secondary | ICD-10-CM | POA: Diagnosis not present

## 2020-10-01 MED ORDER — BUPROPION HCL ER (XL) 300 MG PO TB24
300.0000 mg | ORAL_TABLET | Freq: Every day | ORAL | 3 refills | Status: DC
  Filled 2020-10-01: qty 30, 30d supply, fill #0
  Filled 2020-11-02: qty 30, 30d supply, fill #1
  Filled 2020-12-01: qty 30, 30d supply, fill #2
  Filled 2020-12-26: qty 30, 30d supply, fill #3

## 2020-10-01 NOTE — Assessment & Plan Note (Signed)
Chronic and previously well controlled, but exacerbated in the setting of work stress Contracted for safety-no SI/HI Encourage therapy Continue Cymbalta Increase Wellbutrin to 300 mg daily

## 2020-10-01 NOTE — Assessment & Plan Note (Signed)
Chronic and previously well controlled Exacerbated in the setting of work and home stress Continue Cymbalta at current dose Increase Wellbutrin to 300 mg daily Encourage therapy Contracted for safety-no SI/HI

## 2020-10-01 NOTE — Assessment & Plan Note (Signed)
Well controlled Continue current medications Recheck metabolic panel F/u in 6 months  

## 2020-10-01 NOTE — Progress Notes (Signed)
Established patient visit   Patient: Elizabeth Bowers   DOB: [birth year]   55 y.o. Female  MRN: [id number] Visit Date: 10/01/2020  Today's healthcare provider: Lavon Paganini, MD   Chief Complaint  Patient presents with  . Anxiety  . Hypertension   Subjective    Anxiety Patient reports no chest pain, dizziness, nausea or shortness of breath.    Hypertension Associated symptoms include anxiety. Pertinent negatives include no chest pain, headaches or shortness of breath.    Hypertension, follow-up  BP Readings from Last 3 Encounters:  10/01/20 130/87  06/07/20 134/78  05/21/20 (!) 147/87   Wt Readings from Last 3 Encounters:  10/01/20 180 lb 14.4 oz (82.1 kg)  06/07/20 185 lb (83.9 kg)  05/21/20 187 lb (84.8 kg)     She was last seen for hypertension 3 months ago.  BP at that visit was 134/78. Management since that visit includes no changes.  She reports excellent compliance with treatment. She is not having side effects.  She is following a Low Sodium diet. She is not exercising. She does not smoke.  Use of agents associated with hypertension: none.   Outside blood pressures are not being checked. Symptoms: No chest pain No chest pressure  No palpitations No syncope  No dyspnea No orthopnea  No paroxysmal nocturnal dyspnea No lower extremity edema   Pertinent labs: Lab Results  Component Value Date   CHOL 112 03/24/2020   HDL 45 03/24/2020   LDLCALC 54 03/24/2020   TRIG 64 03/24/2020   CHOLHDL 2.5 03/24/2020   Lab Results  Component Value Date   NA 141 03/24/2020   K 3.8 03/24/2020   CREATININE 1.01 (H) 03/24/2020   GFRNONAA >60 03/24/2020   GFRAA >60 07/07/2019   GLUCOSE 112 (H) 03/24/2020     The ASCVD Risk score (Goff DC Jr., et al., 2013) failed to calculate for the following reasons:   The valid total cholesterol range is 130 to 320 mg/dL   She began taking Maxzide. She states that she is feeling well and denies any symptoms.   --------------------------------------------------------------------------------------------------- Anxiety, Follow-up  She was last seen for anxiety 3 months ago. Changes made at last visit include trial of Welbutrin xl 150mg  daily.   She reports excellent compliance with treatment. She reports excellent tolerance of treatment. She is not having side effects.   She feels her anxiety is mild and Improved since last visit.  Symptoms: No chest pain No difficulty concentrating  No dizziness Yes fatigue  Yes feelings of losing control Yes insomnia  Yes irritable No palpitations  Yes panic attacks No racing thoughts  No shortness of breath Yes sweating  No tremors/shakes    GAD-7 Results GAD-7 Generalized Anxiety Disorder Screening Tool 10/01/2020  1. Feeling Nervous, Anxious, or on Edge 2  2. Not Being Able to Stop or Control Worrying 0  3. Worrying Too Much About Different Things 2  4. Trouble Relaxing 0  5. Being So Restless it's Hard To Sit Still 0  6. Becoming Easily Annoyed or Irritable 2  7. Feeling Afraid As If Something Awful Might Happen 2  Total GAD-7 Score 8  Difficulty At Work, Home, or Getting  Along With Others? Not difficult at all    PHQ-9 Scores PHQ9 SCORE ONLY 10/01/2020 06/07/2020 02/02/2020  PHQ-9 Total Score 17 18 17     --------------------------------------------------------------------------------------------------- Depression, Follow-up  She  was last seen for this 4 months ago. Changes made at last visit include trial  of Welburtin Xl 150mg  daily.   She reports excellent compliance with treatment. She is not having side effects.   She reports excellent tolerance of treatment. Current symptoms include: depressed mood, difficulty concentrating, fatigue and insomnia She feels she is Unchanged since last visit.  Depression screen [facility] 2/9 10/01/2020 06/07/2020 02/02/2020  Decreased Interest 2 2 2   Down, Depressed, Hopeless 2 1 2   PHQ - 2 Score 4 3 4    Altered sleeping 3 3 3   Tired, decreased energy 3 3 3   Change in appetite 3 2 3   Feeling bad or failure about yourself  1 2 2   Trouble concentrating 2 3 1   Moving slowly or fidgety/restless 0 1 1  Suicidal thoughts 1 1 0  PHQ-9 Score 17 18 17   Difficult doing work/chores Somewhat difficult Not difficult at all Somewhat difficult  Some recent data might be hidden   She states that she feels depression occasionally when she is at work and playing with her granddaughter. She requests to have a higher dosage of Wellbutrin.  -----------------------------------------------------------------------------------------   Patient Active Problem List   Diagnosis Date Noted  . MDD (major depressive disorder) 10/01/2020  . Encounter for annual physical exam 06/07/2020  . Essential hypertension 06/07/2020  . Anterior tibialis tendinitis, right 04/28/2020  . Acute right ankle pain 04/28/2020  . Encounter for colonoscopy due to history of adenomatous colonic polyps   . Insomnia 07/07/2019  . Chronic low back pain (Bilateral) w/o sciatica 08/08/2018  . Abnormal MRI, cervical spine (07/10/2018) 07/17/2018  . Cervical spondylitis with radiculitis (HCC) (C6) (Bilateral) (R>L) 06/26/2018  . Chronic sacroiliac joint pain (Bilateral) (R>L) 06/26/2018  . Cervical Grade 1 (2 mm) Retrolisthesis C5 over C6 03/20/2018  . Cervical foraminal stenosis (C3-4 & C5-6) (Bilateral) 03/20/2018  . DDD (degenerative disc disease), cervical 03/20/2018  . Long term prescription benzodiazepine use 03/20/2018  . CKD (chronic kidney disease) stage 3, GFR 30-59 ml/min ([place]) 03/20/2018  . Acid reflux disease 03/20/2018  . Spondylosis without myelopathy or radiculopathy, cervical region 03/20/2018  . Chronic hip pain (Bilateral) (L>R) 03/20/2018  . Lumbar facet syndrome (Bilateral) (R>L) 03/20/2018  . Spondylosis without myelopathy or radiculopathy, lumbar region 03/20/2018  . Chronic Sacroiliac joint dysfunction (Bilateral)  (R>L) 03/20/2018  . Chronic Somatic dysfunction of sacroiliac joint (Bilateral) (R>L) 03/20/2018  . Osteoarthritis of hip (Bilateral) 03/20/2018  . Cervicogenic headache (Bilateral) 03/20/2018  . Cervicalgia (Bilateral) (R>L) 03/20/2018  . Cervical facet syndrome (Bilateral) (R>L) 03/20/2018  . Chronic occipital neuralgia (Bilateral) 03/20/2018  . Chronic upper extremity pain (Fourth Area of Pain) (Bilateral) (R>L) 03/19/2018  . Ganglion cyst 02/20/2018  . Chronic low back pain (Secondary Area of Pain) (Bilateral) (L>R) w/ sciatica (Bilateral) 01/16/2018  . Chronic lower extremity pain [facility] Area of Pain) (Bilateral) (L>R) 01/16/2018  . Chronic neck pain (Primary Area of Pain) (Bilateral) (midline) 01/16/2018  . Chronic foot pain (Left) 01/16/2018  . Chronic pain syndrome 01/16/2018  . Pharmacologic therapy 01/16/2018  . Disorder of skeletal system 01/16/2018  . Problems influencing health status 01/16/2018  . Obesity 11/21/2017  . Anxiety 09/28/2017  . Chronic knee pain (Right) 09/28/2017  . Fatty liver 07/09/2017  . Colon polyps 07/09/2017  . Fibromyalgia 07/09/2017  . Sinusitis, chronic 07/09/2017  . Allergic rhinitis 07/09/2017  . IBS (irritable bowel syndrome) 07/09/2017  . Eyelid dermatitis, allergic/contact 07/09/2017  . Superficial thrombophlebitis 01/12/2017  . Chronic sacroiliac joint pain (Right) 12/16/2016  . DDD (degenerative disc disease), lumbar 07/18/2016  . Family hx of colon cancer  07/18/2016   Social History   Tobacco Use  . Smoking status: Former Smoker    Packs/day: 0.75    Years: 5.00    Pack years: 3.75    Types: Cigarettes    Quit date: 07/06/1987    Years since quitting: 33.2  . Smokeless tobacco: Never Used  Vaping Use  . Vaping Use: Never used  Substance Use Topics  . Alcohol use: Yes    Alcohol/week: 2.0 - 4.0 standard drinks    Types: 2 - 4 Cans of beer per week    Comment: wine cooler  . Drug use: Never   Allergies  Allergen  Reactions  . Gluten Meal     Other reaction(s): Abdominal Pain, Myalgias (Muscle Pain), Other (see comments) Other reaction(s): Other (See Comments)   . Lactose     Other reaction(s): Abdominal Pain, Myalgias (Muscle Pain), Other (see comments) Other reaction(s): Other (See Comments)   . Ranitidine Diarrhea and Other (See Comments)    Diarrhea and stomach cramping  . Amphetamine-Dextroamphetamine Other (See Comments)    Elevated BP  . Lactose Intolerance (Gi)     bloating  . Reglan [Metoclopramide] Other (See Comments)    Increased prolactin level   . Topiramate   . Topiramate Er Other (See Comments)    Severe headache, nausea, vomiting, dizziness, visual disturbance    . Wheat Bran     bloating  . Soap Rash    PROVON excess use  . Sulfa Antibiotics Anxiety       Medications: Outpatient Medications Prior to Visit  Medication Sig  . amLODipine (NORVASC) 2.5 MG tablet TAKE 1 TABLET BY MOUTH DAILY.  . DULoxetine (CYMBALTA) 60 MG capsule TAKE 1 CAPSULE BY MOUTH DAILY.  Marland Kitchen lansoprazole (PREVACID) 30 MG capsule TAKE 1 CAPSULE BY MOUTH TWICE DAILY BEFORE A MEAL.  Marland Kitchen LINZESS 290 MCG CAPS capsule TAKE 1 CAPSULE BY MOUTH DAILY.  . Menaquinone-7 (VITAMIN K2 PO) Take by mouth.  . Multiple Vitamin (MULTIVITAMIN WITH MINERALS) TABS tablet Take 1 tablet by mouth daily.  . psyllium (METAMUCIL) 58.6 % powder Take 2 packets by mouth daily.  . SUMAtriptan (IMITREX) 100 MG tablet Take 100 mg by mouth every 2 (two) hours as needed for migraine. May repeat in 2 hours if headache persists or recurs.  . triamterene-hydrochlorothiazide (MAXZIDE-25) 37.5-25 MG tablet TAKE 1 TABLET BY MOUTH DAILY.  . vitamin C (ASCORBIC ACID) 500 MG tablet Take 500 mg by mouth daily.  . [DISCONTINUED] buPROPion (WELLBUTRIN XL) 150 MG 24 hr tablet TAKE 1 TABLET (150 MG TOTAL) BY MOUTH DAILY.  . [DISCONTINUED] amLODipine (NORVASC) 2.5 MG tablet TAKE 1 TABLET BY MOUTH ONCE A DAY  . [DISCONTINUED] meloxicam (MOBIC) 15 MG  tablet TAKE 1 TABLET BY MOUTH ONCE DAILY (Patient not taking: Reported on 10/01/2020)  . [DISCONTINUED] traMADol (ULTRAM) 50 MG tablet TAKE 1 TABLET BY MOUTH EVERY 8 (EIGHT) HOURS AS NEEDED FOR UP TO 5 DAYS (Patient not taking: Reported on 10/01/2020)   No facility-administered medications prior to visit.    Review of Systems  Constitutional: Negative for chills, fatigue and fever.  HENT: Negative for congestion, ear pain, rhinorrhea, sinus pain and sore throat.   Respiratory: Negative for cough, shortness of breath and wheezing.   Cardiovascular: Negative for chest pain and leg swelling.  Gastrointestinal: Negative for abdominal pain, blood in stool, diarrhea, nausea and vomiting.  Genitourinary: Negative for dysuria, flank pain, frequency and urgency.  Neurological: Negative for dizziness and headaches.    Last  vitamin D Lab Results  Component Value Date   25OHVITD2 <1.0 01/16/2018   25OHVITD3 50 01/16/2018   Last vitamin B12 and Folate Lab Results  Component Value Date   VITAMINB12 1,137 01/16/2018        Objective    BP 130/87 (BP Location: Left Arm, Patient Position: Sitting, Cuff Size: Large)   Pulse 80   Temp 98.8 F (37.1 C) (Oral)   Resp 16   Ht 5\' 2"  (1.575 m)   Wt 180 lb 14.4 oz (82.1 kg)   SpO2 98%   BMI 33.09 kg/m  BP Readings from Last 3 Encounters:  10/01/20 130/87  06/07/20 134/78  05/21/20 (!) 147/87   Wt Readings from Last 3 Encounters:  10/01/20 180 lb 14.4 oz (82.1 kg)  06/07/20 185 lb (83.9 kg)  05/21/20 187 lb (84.8 kg)      Physical Exam Vitals reviewed.  Constitutional:      General: She is not in acute distress.    Appearance: Normal appearance. She is well-developed. She is not diaphoretic.  HENT:     Head: Normocephalic and atraumatic.  Eyes:     General: No scleral icterus.    Conjunctiva/sclera: Conjunctivae normal.  Neck:     Thyroid: No thyromegaly.  Cardiovascular:     Rate and Rhythm: Normal rate and regular rhythm.      Pulses: Normal pulses.     Heart sounds: Normal heart sounds. No murmur heard.   Pulmonary:     Effort: Pulmonary effort is normal. No respiratory distress.     Breath sounds: Normal breath sounds. No wheezing, rhonchi or rales.  Musculoskeletal:     Cervical back: Neck supple.     Right lower leg: No edema.     Left lower leg: No edema.  Lymphadenopathy:     Cervical: No cervical adenopathy.  Skin:    General: Skin is warm and dry.     Findings: No rash.  Neurological:     Mental Status: She is alert and oriented to person, place, and time. Mental status is at baseline.  Psychiatric:        Mood and Affect: Mood normal.        Behavior: Behavior normal.     No results found for any visits on 10/01/20.  Assessment & Plan     Problem List Items Addressed This Visit      Cardiovascular and Mediastinum   Essential hypertension - Primary    Well controlled Continue current medications Recheck metabolic panel F/u in 6 months       Relevant Orders   Basic Metabolic Panel (BMET)     Other   Anxiety    Chronic and previously well controlled, but exacerbated in the setting of work stress Contracted for safety-no SI/HI Encourage therapy Continue Cymbalta Increase Wellbutrin to 300 mg daily      Relevant Medications   buPROPion (WELLBUTRIN XL) 300 MG 24 hr tablet   MDD (major depressive disorder)    Chronic and previously well controlled Exacerbated in the setting of work and home stress Continue Cymbalta at current dose Increase Wellbutrin to 300 mg daily Encourage therapy Contracted for safety-no SI/HI      Relevant Medications   buPROPion (WELLBUTRIN XL) 300 MG 24 hr tablet    Other Visit Diagnoses    Need for shingles vaccine       Relevant Orders   Varicella-zoster vaccine IM (Completed)     Return in about 6 months (around  04/02/2021) for CPE.       Frederic Jericho [place],acting as a Education administrator for Lavon Paganini, MD.,have documented all relevant  documentation on the behalf of Lavon Paganini, MD,as directed by  Lavon Paganini, MD while in the presence of Lavon Paganini, MD.  I, Lavon Paganini, MD, have reviewed all documentation for this visit. The documentation on 10/01/20 for the exam, diagnosis, procedures, and orders are all accurate and complete.   Abbas Beyene, [name], MD, MPH [facility]

## 2020-10-02 LAB — BASIC METABOLIC PANEL
BUN/Creatinine Ratio: 13 (ref 9–23)
BUN: 13 mg/dL (ref 6–24)
CO2: 23 mmol/L (ref 20–29)
Calcium: 9.6 mg/dL (ref 8.7–10.2)
Chloride: 97 mmol/L (ref 96–106)
Creatinine, Ser: 0.97 mg/dL (ref 0.57–1.00)
Glucose: 103 mg/dL — ABNORMAL HIGH (ref 65–99)
Potassium: 5.1 mmol/L (ref 3.5–5.2)
Sodium: 141 mmol/L (ref 134–144)
eGFR: 69 mL/min/{1.73_m2} (ref >59)

## 2020-10-05 ENCOUNTER — Other Ambulatory Visit (HOSPITAL_COMMUNITY): Payer: Self-pay

## 2020-11-02 ENCOUNTER — Other Ambulatory Visit (HOSPITAL_COMMUNITY): Payer: Self-pay

## 2020-11-02 MED FILL — Lansoprazole Cap Delayed Release 30 MG: ORAL | 90 days supply | Qty: 180 | Fill #0 | Status: AC

## 2020-11-02 MED FILL — Linaclotide Cap 290 MCG: ORAL | 90 days supply | Qty: 90 | Fill #0 | Status: AC

## 2020-11-14 NOTE — Progress Notes (Signed)
PROVIDER NOTE: Information contained herein reflects review and annotations entered in association with encounter. Interpretation of such information and data should be left to medically-trained personnel. Information provided to patient can be located elsewhere in the medical record under "Patient Instructions". Document created using STT-dictation technology, any transcriptional errors that may result from process are unintentional.    Patient: Elizabeth Bowers  Service Category: E/M  Provider: Gaspar Cola, MD  DOB: 05/02/1966  DOS: 11/15/2020  Specialty: Interventional Pain Management  MRN: [id number]  Setting: Ambulatory outpatient  PCP: Virginia Crews, MD  Type: Established Patient    Referring Provider: Virginia Crews, MD  Location: Office  Delivery: Face-to-face     HPI  Ms. Hornick, a 55 y.o. year old female, is here today because of her Acute foot pain, left [M79.672]. Ms. Bartell primary complain today is Back Pain (low), Foot Pain (left), and Headache Last encounter: My last encounter with her was on Visit date not found. Pertinent problems: Ms. Soland has Chronic sacroiliac joint pain (Right); Fibromyalgia; Chronic knee pain (Right); DDD (degenerative disc disease), lumbar; Chronic low back pain (2ry area of Pain) (Bilateral) (L>R) w/ sciatica (Bilateral); Chronic lower extremity pain (3ry area of Pain) (Bilateral) (L>R); Chronic neck pain (1ry area of Pain) (Bilateral) (midline); Chronic foot pain (Left); Chronic pain syndrome; Ganglion cyst; Chronic upper extremity pain (Fourth Area of Pain) (Bilateral) (R>L); Cervical Grade 1 (2 mm) Retrolisthesis C5 over C6; Cervical foraminal stenosis (C3-4 & C5-6) (Bilateral); DDD (degenerative disc disease), cervical; Spondylosis without myelopathy or radiculopathy, cervical region; Chronic hip pain (Bilateral) (L>R); Lumbar facet syndrome (Bilateral) (R>L); Spondylosis without myelopathy or radiculopathy, lumbar region; Chronic  Sacroiliac joint dysfunction (Bilateral) (R>L); Chronic Somatic dysfunction of sacroiliac joint (Bilateral) (R>L); Osteoarthritis of hip (Bilateral); Cervicogenic headache (Bilateral); Cervicalgia (Bilateral) (R>L); Cervical facet syndrome (Bilateral) (R>L); Chronic occipital neuralgia (Bilateral); Cervical spondylitis with radiculitis (HCC) (C6) (Bilateral) (R>L); Chronic sacroiliac joint pain (Bilateral) (R>L); Abnormal MRI, cervical spine (07/10/2018); Chronic low back pain (Bilateral) w/o sciatica; Acute right ankle pain; Acute foot pain (Left); and Morton's neuroma of foot (Left) on their pertinent problem list. Pain Assessment: Severity of   is reported as a 6 /10. Location: Foot Left/to ankle. Onset: More than a month ago. Quality: Burning. Timing: Constant. Modifying factor(s):  Marland Kitchen Vitals:  height is '5\' 2"'  (1.575 m) and weight is 175 lb (79.4 kg). Her temporal temperature is 97.3 F (36.3 C) (abnormal). Her blood pressure is 144/98 (abnormal) and her pulse is 91. Her respiration is 18 and oxygen saturation is 99%.   Reason for encounter: worsening of previously known (established) problem.  The patient returns to the clinics after last having been seen on 08/08/2018.  She indicates having a flareup of her left foot pain.  She has a history of having had a fracture in the left foot in 2001 for which she has needed 3 different surgeries, the last of which was done in 2006.  She indicates having seen Dr. Manuella Ghazi [facility] neurology) yesterday.  According to her, he seems to think that we are dealing with a Morton's neuroma.  Lab work was ordered to rule out gout.  The patient indicates no trauma or anything else that she can recognize as the trigger for this flareup.  Because of flareup, she also indicates not having been able to sleep.  She attempted using tramadol and a muscle relaxant, but neither 1 helped.  She does have a little bit of hyperemia around her prior  scar.  She describes that that is  where he seems to hurt.  She denies any increased temperature, discharge, or significant swelling.  She also denies any night sweats, or any other signs or symptoms of infection.  Today I will be ordering some x-rays of the area and I will provide her with a steroid taper to see if we can eliminate this pain without having to do any interventional therapy.  However, if the pain does not improve with the oral steroids, we will consider either injecting the area with local anesthetic and steroids.  Pharmacotherapy Assessment   Analgesic: No chronic opioid analgesics therapy prescribed by our practice.   Monitoring: [place] PMP: PDMP reviewed during this encounter.       Pharmacotherapy: No side-effects or adverse reactions reported. Compliance: No problems identified. Effectiveness: Clinically acceptable.  [name], RN  11/15/2020  1:08 PM  Signed Safety precautions to be maintained throughout the outpatient stay will include: orient to surroundings, keep bed in low position, maintain call bell within reach at all times, provide assistance with transfer out of bed and ambulation.     UDS:  Summary  Date Value Ref Range Status  01/16/2018 FINAL  Final    Comment:    ==================================================================== TOXASSURE COMP DRUG ANALYSIS,UR ==================================================================== Test                             Result       Flag       Units Drug Present and Declared for Prescription Verification   Oxazepam                       51           EXPECTED   ng/mg creat   Temazepam                      44           EXPECTED   ng/mg creat    Oxazepam and temazepam are expected metabolites of diazepam.    Oxazepam is also an expected metabolite of other benzodiazepine    drugs, including chlordiazepoxide, prazepam, clorazepate,    halazepam, and temazepam.  Oxazepam and temazepam are available    as scheduled prescription medications.    Phentermine                    PRESENT      EXPECTED   Duloxetine                     PRESENT      EXPECTED Drug Present not Declared for Prescription Verification   Topiramate                     PRESENT      UNEXPECTED   Acetaminophen                  PRESENT      UNEXPECTED Drug Absent but Declared for Prescription Verification   Cyclobenzaprine                Not Detected UNEXPECTED   Promethazine                   Not Detected UNEXPECTED ==================================================================== Test  Result    Flag   Units      Ref Range   Creatinine              57               mg/dL      >=20 ==================================================================== Declared Medications:  The flagging and interpretation on this report are based on the  following declared medications.  Unexpected results may arise from  inaccuracies in the declared medications.  **Note: The testing scope of this panel includes these medications:  Cyclobenzaprine  Diazepam  Duloxetine  Phentermine  Promethazine  **Note: The testing scope of this panel does not include following  reported medications:  Acyclovir  Budesonide  Docusate  Hydrochlorothiazide (Triamterine-Hydrochlorthzide)  Levocetirizine  Linaclotide  Meclizine  Montelukast  Multivitamin  Potassium  Triamterene (Triamterine-Hydrochlorthzide)  Valacyclovir ==================================================================== For clinical consultation, please call [phone]. ====================================================================      ROS  Constitutional: Denies any fever or chills Gastrointestinal: No reported hemesis, hematochezia, vomiting, or acute GI distress Musculoskeletal: Denies any acute onset joint swelling, redness, loss of ROM, or weakness Neurological: No reported episodes of acute onset apraxia, aphasia, dysarthria, agnosia, amnesia, paralysis, loss of coordination,  or loss of consciousness  Medication Review  DULoxetine, Menaquinone-7, SUMAtriptan, amLODipine, buPROPion, lansoprazole, linaclotide, multivitamin with minerals, predniSONE, psyllium, triamterene-hydrochlorothiazide, and vitamin C  History Review  Allergy: Ms. Hendley is allergic to gluten meal, lactose, ranitidine, amphetamine-dextroamphetamine, lactose intolerance (gi), reglan [metoclopramide], topiramate, topiramate er, wheat bran, soap, and sulfa antibiotics. Drug: Ms. Cerros  reports no history of drug use. Alcohol:  reports current alcohol use of about 2.0 - 4.0 standard drinks of alcohol per week. Tobacco:  reports that she quit smoking about 33 years ago. Her smoking use included cigarettes. She has a 3.75 pack-year smoking history. She has never used smokeless tobacco. Social: Ms. Acheampong  reports that she quit smoking about 33 years ago. Her smoking use included cigarettes. She has a 3.75 pack-year smoking history. She has never used smokeless tobacco. She reports current alcohol use of about 2.0 - 4.0 standard drinks of alcohol per week. She reports that she does not use drugs. Medical:  has a past medical history of Fatty liver, Fibromyalgia, GERD (gastroesophageal reflux disease), Hemorrhoids, Hot flashes, Hypertension, IBS (irritable bowel syndrome), LUQ abdominal pain (10/25/2016), Migraines, Miscarriage, Multilevel degenerative disc disease, and Vocal cord dysfunction. Surgical: Ms. Eckles  has a past surgical history that includes Elbow Debridement; Foot surgery; Cholecystectomy; Colonoscopy; Abdominal hysterectomy; Esophagogastroduodenoscopy (egd) with propofol (N/A, 02/06/2018); Colonoscopy with propofol (N/A, 12/22/2019); and Colonoscopy with propofol (N/A, 12/23/2019). Family: family history includes Alcohol abuse in her father and son; Arthritis in her son and son; Asthma in her sister; COPD in her father, paternal grandmother, and sister; Cancer in her paternal grandfather and sister; Colon  cancer in her sister; Colon polyps in her sister; Depression in her son; Diabetes in her maternal grandmother, mother, and son; Drug abuse in her son; Glaucoma in her mother; Hyperlipidemia in her mother; Hypertension in her mother; Kidney disease in her sister; Mental illness in her sister and son; Miscarriages / Stillbirths in her sister and sister; Uterine cancer in her sister.  Laboratory Chemistry Profile   Renal Lab Results  Component Value Date   BUN 13 10/01/2020   CREATININE 0.97 10/01/2020   BCR 13 10/01/2020   GFRAA >60 07/07/2019   GFRNONAA >60 03/24/2020     Hepatic Lab Results  Component Value Date   AST 39 03/24/2020  ALT 57 (H) 03/24/2020   ALBUMIN 4.3 03/24/2020   ALKPHOS 88 03/24/2020   LIPASE 33 12/15/2016     Electrolytes Lab Results  Component Value Date   NA 141 10/01/2020   K 5.1 10/01/2020   CL 97 10/01/2020   CALCIUM 9.6 10/01/2020   MG 2.2 01/16/2018     Bone Lab Results  Component Value Date   25OHVITD1 51 01/16/2018   25OHVITD2 <1.0 01/16/2018   25OHVITD3 50 01/16/2018     Inflammation (CRP: Acute Phase) (ESR: Chronic Phase) Lab Results  Component Value Date   CRP 8 01/16/2018   ESRSEDRATE 16 01/16/2018       Note: Above Lab results reviewed.  Recent Imaging Review  MM 3D SCREEN BREAST BILATERAL CLINICAL DATA:  Screening.  EXAM: DIGITAL SCREENING BILATERAL MAMMOGRAM WITH TOMO AND CAD  COMPARISON:  Previous exam(s).  ACR Breast Density Category b: There are scattered areas of fibroglandular density.  FINDINGS: There are no findings suspicious for malignancy. Images were processed with CAD.  IMPRESSION: No mammographic evidence of malignancy. A result letter of this screening mammogram will be mailed directly to the patient.  RECOMMENDATION: Screening mammogram in one year. (Code:SM-B-01Y)  BI-RADS CATEGORY  1: Negative.  Electronically Signed   By: Audie Pinto M.D.   On: 05/31/2020 14:46 Note: Reviewed         Physical Exam  General appearance: Well nourished, well developed, and well hydrated. In no apparent acute distress Mental status: Alert, oriented x 3 (person, place, & time)       Respiratory: No evidence of acute respiratory distress Eyes: PERLA Vitals: BP (!) 144/98 (BP Location: Left Arm, Patient Position: Sitting, Cuff Size: Large)   Pulse 91   Temp (!) 97.3 F (36.3 C) (Temporal)   Resp 18   Ht '5\' 2"'  (1.575 m)   Wt 175 lb (79.4 kg)   SpO2 99%   BMI 32.01 kg/m  BMI: Estimated body mass index is 32.01 kg/m as calculated from the following:   Height as of this encounter: '5\' 2"'  (1.575 m).   Weight as of this encounter: 175 lb (79.4 kg). Ideal: Ideal body weight: 50.1 kg (110 lb 7.2 oz) Adjusted ideal body weight: 61.8 kg (136 lb 4.3 oz)  Assessment   Status Diagnosis  Controlled Controlled Controlled 1. Acute foot pain (Left)   2. Chronic foot pain (Left)   3. Chronic lower extremity pain (3ry area of Pain) (Bilateral) (L>R)   4. Morton's neuroma of foot (Left)      Updated Problems: Problem  Acute foot pain (Left)  Morton's neuroma of foot (Left)  Acute Right Ankle Pain  Chronic low back pain (2ry area of Pain) (Bilateral) (L>R) w/ sciatica (Bilateral)  Chronic lower extremity pain (3ry area of Pain) (Bilateral) (L>R)   The patient pain pattern follows that of "referred pain" from the lumbar facets.    Chronic neck pain (1ry area of Pain) (Bilateral) (midline)  Stage 3 chronic kidney disease (HCC)   Formatting of this note might be different from the original. Update for Diagnosis Load    Benign Essential Hypertension  Radial Styloid Tenosynovitis of Left Hand  Pain in Right Hand  Bilateral Carpal Tunnel Syndrome  Chronic Pain of Left Wrist    Plan of Care  Problem-specific:  No problem-specific Assessment & Plan notes found for this encounter.  Ms. Zyria Fiscus has a current medication list which includes the following long-term medication(s):  amlodipine, bupropion, duloxetine, lansoprazole, linzess, and  triamterene-hydrochlorothiazide.  Pharmacotherapy (Medications Ordered): Meds ordered this encounter  Medications   predniSONE (DELTASONE) 20 MG tablet    Sig: Take 3 tablets (60 mg total) by mouth daily with breakfast for 3 days, THEN 2 tablets (40 mg total) daily with breakfast for 3 days, THEN 1 tablet (20 mg total) daily with breakfast for 3 days.    Dispense:  18 tablet    Refill:  0    Orders:  Orders Placed This Encounter  Procedures   DG Foot Complete Left    Standing Status:   Future    Standing Expiration Date:   12/15/2020    Scheduling Instructions:     Imaging must be done as soon as possible. Inform patient that order will expire within 30 days and I will not renew it.    Order Specific Question:   Reason for Exam (SYMPTOM  OR DIAGNOSIS REQUIRED)    Answer:   Left foot pain with prior history of fracture and 3 subsequent surgeries.    Order Specific Question:   Is patient pregnant?    Answer:   No    Order Specific Question:   Preferred imaging location?    Answer:   [facility]    Order Specific Question:   Call Results- Best Contact Number?    Answer:   [phone] ([facility])    Order Specific Question:   Radiology Contrast Protocol - do NOT remove file path    Answer:   [email or web address]    Order Specific Question:   Release to patient    Answer:   Immediate    Follow-up plan:   Return in about 2 weeks (around 11/29/2020) for evaluation day (afternoon VV).      Interventional Therapies  Risk  Complexity Considerations:   Estimated body mass index is 32.01 kg/m as calculated from the following:   Height as of this encounter: '5\' 2"'  (1.575 m).   Weight as of this encounter: 175 lb (79.4 kg). WNL   Planned  Pending:   Pending further evaluation   Under consideration:   Diagnostic right-sided sacroiliac joint block  Diagnostic bilateral  IA hip joint injection Diagnostic bilateral femoral + obturator NB  Diagnostic bilateral L5 transforaminal ESI  Diagnostic bilateal IA steroid knee injection Therapeutic bilateral viscosupplementation knee injections  Diagnostic bilateral genicular nerve block    Completed:   Palliative bilateral cervical facet MBB x2  Diagnostic bilateral lumbar facet MBB x2  Diagnostic right Cervical ESI x1  Therapeutic right cervical facet RFA x1    Therapeutic  Palliative (PRN) options:   Palliative bilateral cervical facet MBB #3  Diagnostic bilateral lumbar facet block #3  Diagnostic right Cervical ESI #2  Therapeutic right cervical facet RFA #2     Recent Visits No visits were found meeting these conditions. Showing recent visits within past 90 days and meeting all other requirements Today's Visits Date Type Provider Dept  11/15/20 Office Visit Milinda Pointer, MD [facility]  Showing today's visits and meeting all other requirements Future Appointments No visits were found meeting these conditions. Showing future appointments within next 90 days and meeting all other requirements I discussed the assessment and treatment plan with the patient. The patient was provided an opportunity to ask questions and all were answered. The patient agreed with the plan and demonstrated an understanding of the instructions.  Patient advised to call back or seek an in-person evaluation if the symptoms or condition worsens.  Duration of encounter: 30 minutes.  Note by: Gaspar Cola, MD Date: 11/15/2020; Time: 1:32 PM

## 2020-11-15 ENCOUNTER — Ambulatory Visit
Admission: RE | Admit: 2020-11-15 | Discharge: 2020-11-15 | Disposition: A | Discharge status: Home / Self Care | Payer: 59 | Attending: Pain Medicine | Admitting: Pain Medicine

## 2020-11-15 ENCOUNTER — Encounter: Payer: Self-pay | Admitting: Pain Medicine

## 2020-11-15 ENCOUNTER — Ambulatory Visit: Payer: 59 | Attending: Pain Medicine | Admitting: Pain Medicine

## 2020-11-15 ENCOUNTER — Other Ambulatory Visit: Payer: Self-pay

## 2020-11-15 ENCOUNTER — Ambulatory Visit
Admission: RE | Admit: 2020-11-15 | Discharge: 2020-11-15 | Disposition: A | Discharge status: Home / Self Care | Payer: 59 | Source: Ambulatory Visit | Attending: Pain Medicine | Admitting: Pain Medicine

## 2020-11-15 VITALS — BP 144/98 | HR 91 | Temp 97.3°F | Resp 18 | Ht 62.0 in | Wt 175.0 lb

## 2020-11-15 DIAGNOSIS — G8929 Other chronic pain: Secondary | ICD-10-CM | POA: Diagnosis not present

## 2020-11-15 DIAGNOSIS — G5762 Lesion of plantar nerve, left lower limb: Secondary | ICD-10-CM | POA: Insufficient documentation

## 2020-11-15 DIAGNOSIS — M79672 Pain in left foot: Secondary | ICD-10-CM | POA: Diagnosis not present

## 2020-11-15 DIAGNOSIS — E519 Thiamine deficiency, unspecified: Secondary | ICD-10-CM | POA: Diagnosis not present

## 2020-11-15 DIAGNOSIS — E538 Deficiency of other specified B group vitamins: Secondary | ICD-10-CM | POA: Diagnosis not present

## 2020-11-15 DIAGNOSIS — M79604 Pain in right leg: Secondary | ICD-10-CM

## 2020-11-15 DIAGNOSIS — M79605 Pain in left leg: Secondary | ICD-10-CM | POA: Diagnosis not present

## 2020-11-15 DIAGNOSIS — E559 Vitamin D deficiency, unspecified: Secondary | ICD-10-CM | POA: Diagnosis not present

## 2020-11-15 DIAGNOSIS — M792 Neuralgia and neuritis, unspecified: Secondary | ICD-10-CM | POA: Diagnosis not present

## 2020-11-15 DIAGNOSIS — R519 Headache, unspecified: Secondary | ICD-10-CM | POA: Diagnosis not present

## 2020-11-15 DIAGNOSIS — M19072 Primary osteoarthritis, left ankle and foot: Secondary | ICD-10-CM | POA: Diagnosis not present

## 2020-11-15 DIAGNOSIS — E531 Pyridoxine deficiency: Secondary | ICD-10-CM | POA: Diagnosis not present

## 2020-11-15 DIAGNOSIS — Z131 Encounter for screening for diabetes mellitus: Secondary | ICD-10-CM | POA: Diagnosis not present

## 2020-11-15 MED ORDER — PREDNISONE 20 MG PO TABS
ORAL_TABLET | ORAL | 0 refills | Status: AC
  Filled 2020-11-15 (×2): qty 18, 9d supply, fill #0

## 2020-11-15 NOTE — Progress Notes (Signed)
Safety precautions to be maintained throughout the outpatient stay will include: orient to surroundings, keep bed in low position, maintain call bell within reach at all times, provide assistance with transfer out of bed and ambulation.  

## 2020-11-23 ENCOUNTER — Other Ambulatory Visit: Payer: Self-pay | Admitting: Family Medicine

## 2020-11-23 ENCOUNTER — Other Ambulatory Visit (HOSPITAL_COMMUNITY): Payer: Self-pay

## 2020-11-23 MED ORDER — DULOXETINE HCL 60 MG PO CPEP
ORAL_CAPSULE | Freq: Every day | ORAL | 1 refills | Status: DC
  Filled 2020-11-23: qty 90, 90d supply, fill #0
  Filled 2021-02-09: qty 90, 90d supply, fill #1

## 2020-11-29 ENCOUNTER — Telehealth: Payer: 59 | Admitting: Pain Medicine

## 2020-12-01 ENCOUNTER — Other Ambulatory Visit (HOSPITAL_COMMUNITY): Payer: Self-pay

## 2020-12-13 ENCOUNTER — Telehealth: Payer: Self-pay | Admitting: *Deleted

## 2020-12-13 ENCOUNTER — Other Ambulatory Visit: Payer: Self-pay | Admitting: Family Medicine

## 2020-12-13 ENCOUNTER — Other Ambulatory Visit (HOSPITAL_COMMUNITY): Payer: Self-pay

## 2020-12-13 ENCOUNTER — Encounter: Payer: Self-pay | Admitting: Pain Medicine

## 2020-12-13 MED ORDER — AMLODIPINE BESYLATE 2.5 MG PO TABS
ORAL_TABLET | Freq: Every day | ORAL | 2 refills | Status: DC
  Filled 2020-12-13: qty 30, 30d supply, fill #0
  Filled 2021-01-10: qty 30, 30d supply, fill #1
  Filled 2021-02-16: qty 30, 30d supply, fill #2

## 2020-12-13 MED ORDER — TRIAMTERENE-HCTZ 37.5-25 MG PO TABS
1.0000 | ORAL_TABLET | Freq: Every day | ORAL | 1 refills | Status: DC
  Filled 2020-12-13: qty 90, 90d supply, fill #0
  Filled 2021-03-18: qty 90, 90d supply, fill #1

## 2020-12-13 NOTE — Telephone Encounter (Signed)
Attempted to call for pre appointment review of allergies/meds. Message left. 

## 2020-12-13 NOTE — Progress Notes (Signed)
Patient: Elizabeth Bowers  Service Category: E/M  Provider: Gaspar Cola, MD  DOB: 04/13/1966  DOS: 12/14/2020  Location: Office  MRN: [id number]  Setting: Ambulatory outpatient  Referring Provider: Virginia Crews, MD  Type: Established Patient  Specialty: Interventional Pain Management  PCP: Virginia Crews, MD  Location: Remote location  Delivery: TeleHealth     Virtual Encounter - Pain Management PROVIDER NOTE: Information contained herein reflects review and annotations entered in association with encounter. Interpretation of such information and data should be left to medically-trained personnel. Information provided to patient can be located elsewhere in the medical record under "Patient Instructions". Document created using STT-dictation technology, any transcriptional errors that may result from process are unintentional.    Contact & Pharmacy Preferred: [phone] Home: [phone] (home) Mobile: [phone] (mobile) E-mail: [email or web address]  [place] [address] Phone: [phone] Fax: [phone]  [place] [address] Phone: [phone] Fax: [phone]   Pre-screening  Elizabeth Bowers offered "in-person" vs "virtual" encounter. She indicated preferring virtual for this encounter.   Reason COVID-19*  Social distancing based on CDC and AMA recommendations.   I contacted Elizabeth Bowers on 12/14/2020 via telephone.      I clearly identified myself as Gaspar Cola, MD. I verified that I was speaking with the correct person using two identifiers (Name: Elizabeth Bowers, and date of birth: 02-22-1966).  Consent I sought verbal advanced consent from Elizabeth Bowers for virtual visit interactions. I informed Elizabeth Bowers of possible security and privacy concerns, risks, and limitations associated with providing "not-in-person" medical evaluation and  management services. I also informed Elizabeth Bowers of the availability of "in-person" appointments. Finally, I informed her that there would be a charge for the virtual visit and that she could be  personally, fully or partially, financially responsible for it. Elizabeth Bowers expressed understanding and agreed to proceed.   Historic Elements   Elizabeth Bowers is a 55 y.o. year old, female patient evaluated today after our last contact on 11/15/2020. Elizabeth Bowers  has a past medical history of Fatty liver, Fibromyalgia, GERD (gastroesophageal reflux disease), Hemorrhoids, Hot flashes, Hypertension, IBS (irritable bowel syndrome), LUQ abdominal pain (10/25/2016), Migraines, Miscarriage, Multilevel degenerative disc disease, and Vocal cord dysfunction. She also  has a past surgical history that includes Elbow Debridement; Foot surgery; Cholecystectomy; Colonoscopy; Abdominal hysterectomy; Esophagogastroduodenoscopy (egd) with propofol (N/A, 02/06/2018); Colonoscopy with propofol (N/A, 12/22/2019); and Colonoscopy with propofol (N/A, 12/23/2019). Elizabeth Bowers has a current medication list which includes the following prescription(s): bupropion, duloxetine, lansoprazole, linzess, menaquinone-7, multivitamin with minerals, psyllium, sumatriptan, vitamin c, amlodipine, and triamterene-hydrochlorothiazide. She  reports that she quit smoking about 33 years ago. Her smoking use included cigarettes. She has a 3.75 pack-year smoking history. She has never used smokeless tobacco. She reports current alcohol use of about 2.0 - 4.0 standard drinks of alcohol per week. She reports that she does not use drugs. Elizabeth Bowers is allergic to gluten meal, lactose, ranitidine, amphetamine-dextroamphetamine, lactose intolerance (gi), reglan [metoclopramide], topiramate, topiramate er, wheat bran, soap, and sulfa antibiotics.   HPI  Today, she is being contacted for follow-up evaluation of left foot pain s/p steroid taper on 11/15/20.  Today the patient  indicates that the steroids did help, but unfortunately the minute that she goes back to work and she puts weight on that foot it begins to hurt.  She is scheduled to see a podiatrist  next Monday.  At this point she indicates that she is doing okay and does not need anything else from [place].  The patient was told to give [place] a call if there was anything else that we could do for her.  She understood and agreed.  Pharmacotherapy Assessment  Analgesic: No chronic opioid analgesics therapy prescribed by our practice.   Monitoring: [place] PMP: PDMP reviewed during this encounter.       Pharmacotherapy: No side-effects or adverse reactions reported. Compliance: No problems identified. Effectiveness: Clinically acceptable. Plan: Refer to "POC".  UDS:  Summary  Date Value Ref Range Status  01/16/2018 FINAL  Final    Comment:    ==================================================================== TOXASSURE COMP DRUG ANALYSIS,UR ==================================================================== Test                             Result       Flag       Units Drug Present and Declared for Prescription Verification   Oxazepam                       51           EXPECTED   ng/mg creat   Temazepam                      44           EXPECTED   ng/mg creat    Oxazepam and temazepam are expected metabolites of diazepam.    Oxazepam is also an expected metabolite of other benzodiazepine    drugs, including chlordiazepoxide, prazepam, clorazepate,    halazepam, and temazepam.  Oxazepam and temazepam are available    as scheduled prescription medications.   Phentermine                    PRESENT      EXPECTED   Duloxetine                     PRESENT      EXPECTED Drug Present not Declared for Prescription Verification   Topiramate                     PRESENT      UNEXPECTED   Acetaminophen                  PRESENT      UNEXPECTED Drug Absent but Declared for Prescription Verification   Cyclobenzaprine                 Not Detected UNEXPECTED   Promethazine                   Not Detected UNEXPECTED ==================================================================== Test                      Result    Flag   Units      Ref Range   Creatinine              57               mg/dL      >=20 ==================================================================== Declared Medications:  The flagging and interpretation on this report are based on the  following declared medications.  Unexpected results may arise from  inaccuracies in the declared medications.  **Note: The testing scope of this panel includes these medications:  Cyclobenzaprine  Diazepam  Duloxetine  Phentermine  Promethazine  **Note: The testing scope of this panel does not include following  reported medications:  Acyclovir  Budesonide  Docusate  Hydrochlorothiazide (Triamterine-Hydrochlorthzide)  Levocetirizine  Linaclotide  Meclizine  Montelukast  Multivitamin  Potassium  Triamterene (Triamterine-Hydrochlorthzide)  Valacyclovir ==================================================================== For clinical consultation, please call [phone]. ====================================================================     Laboratory Chemistry Profile   Renal Lab Results  Component Value Date   BUN 13 10/01/2020   CREATININE 0.97 10/01/2020   BCR 13 10/01/2020   GFRAA >60 07/07/2019   GFRNONAA >60 03/24/2020    Hepatic Lab Results  Component Value Date   AST 39 03/24/2020   ALT 57 (H) 03/24/2020   ALBUMIN 4.3 03/24/2020   ALKPHOS 88 03/24/2020   LIPASE 33 12/15/2016    Electrolytes Lab Results  Component Value Date   NA 141 10/01/2020   K 5.1 10/01/2020   CL 97 10/01/2020   CALCIUM 9.6 10/01/2020   MG 2.2 01/16/2018    Bone Lab Results  Component Value Date   25OHVITD1 51 01/16/2018   25OHVITD2 <1.0 01/16/2018   25OHVITD3 50 01/16/2018    Inflammation (CRP: Acute Phase) (ESR: Chronic Phase) Lab  Results  Component Value Date   CRP 8 01/16/2018   ESRSEDRATE 16 01/16/2018          Note: Above Lab results reviewed.  Imaging  DG Foot Complete Left CLINICAL DATA:  Foot pain  EXAM: LEFT FOOT - COMPLETE 3+ VIEW  COMPARISON:  01/22/2018  FINDINGS: No fracture or malalignment. Mild degenerative changes at the first MTP joint. Small os trigonum. Posterior calcaneal enthesophyte.  IMPRESSION: Mild degenerative changes at the first MTP joint  Electronically Signed   By: Donavan Foil M.D.   On: 11/15/2020 23:34  Assessment  The primary encounter diagnosis was Chronic foot pain (Left). Diagnoses of Acute foot pain (Left), Chronic pain syndrome, Chronic neck pain (1ry area of Pain) (Bilateral) (midline), Chronic low back pain (2ry area of Pain) (Bilateral) (L>R) w/ sciatica (Bilateral), and Chronic lower extremity pain (3ry area of Pain) (Bilateral) (L>R) were also pertinent to this visit.  Plan of Care  Problem-specific:  No problem-specific Assessment & Plan notes found for this encounter.  Elizabeth Bowers has a current medication list which includes the following long-term medication(s): bupropion, duloxetine, lansoprazole, linzess, amlodipine, and triamterene-hydrochlorothiazide.  Pharmacotherapy (Medications Ordered): No orders of the defined types were placed in this encounter.  Orders:  No orders of the defined types were placed in this encounter.  Follow-up plan:   No follow-ups on file.     Interventional Therapies  Risk  Complexity Considerations:   Estimated body mass index is 32.01 kg/m as calculated from the following:   Height as of this encounter: _0  (1.575 m).   Weight as of this encounter: 175 lb (79.4 kg). WNL   Planned  Pending:   Pending further evaluation   Under consideration:   Diagnostic right-sided sacroiliac joint block  Diagnostic bilateral IA hip joint injection Diagnostic bilateral femoral + obturator NB  Diagnostic  bilateral L5 transforaminal ESI  Diagnostic bilateal IA steroid knee injection Therapeutic bilateral viscosupplementation knee injections  Diagnostic bilateral genicular nerve block    Completed:   Palliative bilateral cervical facet MBB x2  Diagnostic bilateral lumbar facet MBB x2  Diagnostic right Cervical ESI x1  Therapeutic right cervical facet RFA x1    Therapeutic  Palliative (PRN) options:   Palliative bilateral cervical facet MBB #3  Diagnostic bilateral lumbar facet block #3  Diagnostic right Cervical ESI #2  Therapeutic right cervical facet RFA #2     Recent Visits Date Type Provider Dept  11/15/20 Office Visit Milinda Pointer, MD [facility]  Showing recent visits within past 90 days and meeting all other requirements Today's Visits Date Type Provider Dept  12/14/20 Telemedicine Milinda Pointer, MD [facility]  Showing today's visits and meeting all other requirements Future Appointments No visits were found meeting these conditions. Showing future appointments within next 90 days and meeting all other requirements I discussed the assessment and treatment plan with the patient. The patient was provided an opportunity to ask questions and all were answered. The patient agreed with the plan and demonstrated an understanding of the instructions.  Patient advised to call back or seek an in-person evaluation if the symptoms or condition worsens.  Duration of encounter: 11 minutes.  Note by: Gaspar Cola, MD Date: 12/14/2020; Time: 5:16 PM

## 2020-12-14 ENCOUNTER — Other Ambulatory Visit: Payer: Self-pay

## 2020-12-14 ENCOUNTER — Ambulatory Visit: Payer: 59 | Attending: Pain Medicine | Admitting: Pain Medicine

## 2020-12-14 DIAGNOSIS — M5441 Lumbago with sciatica, right side: Secondary | ICD-10-CM | POA: Diagnosis not present

## 2020-12-14 DIAGNOSIS — M79605 Pain in left leg: Secondary | ICD-10-CM

## 2020-12-14 DIAGNOSIS — M79604 Pain in right leg: Secondary | ICD-10-CM

## 2020-12-14 DIAGNOSIS — M5442 Lumbago with sciatica, left side: Secondary | ICD-10-CM | POA: Diagnosis not present

## 2020-12-14 DIAGNOSIS — G894 Chronic pain syndrome: Secondary | ICD-10-CM | POA: Diagnosis not present

## 2020-12-14 DIAGNOSIS — M79672 Pain in left foot: Secondary | ICD-10-CM

## 2020-12-14 DIAGNOSIS — M542 Cervicalgia: Secondary | ICD-10-CM

## 2020-12-14 DIAGNOSIS — G8929 Other chronic pain: Secondary | ICD-10-CM

## 2020-12-20 DIAGNOSIS — M792 Neuralgia and neuritis, unspecified: Secondary | ICD-10-CM | POA: Diagnosis not present

## 2020-12-20 DIAGNOSIS — M79672 Pain in left foot: Secondary | ICD-10-CM | POA: Diagnosis not present

## 2020-12-20 DIAGNOSIS — G8929 Other chronic pain: Secondary | ICD-10-CM | POA: Diagnosis not present

## 2020-12-20 DIAGNOSIS — G5762 Lesion of plantar nerve, left lower limb: Secondary | ICD-10-CM | POA: Diagnosis not present

## 2020-12-20 DIAGNOSIS — G894 Chronic pain syndrome: Secondary | ICD-10-CM | POA: Diagnosis not present

## 2020-12-27 ENCOUNTER — Other Ambulatory Visit (HOSPITAL_COMMUNITY): Payer: Self-pay

## 2021-01-06 ENCOUNTER — Ambulatory Visit
Admission: RE | Admit: 2021-01-06 | Discharge: 2021-01-06 | Disposition: A | Discharge status: Home / Self Care | Payer: 59 | Source: Ambulatory Visit | Attending: Emergency Medicine | Admitting: Emergency Medicine

## 2021-01-06 ENCOUNTER — Other Ambulatory Visit: Payer: Self-pay

## 2021-01-06 VITALS — BP 148/94 | HR 93 | Temp 98.9°F | Resp 18

## 2021-01-06 DIAGNOSIS — Z1152 Encounter for screening for COVID-19: Secondary | ICD-10-CM | POA: Diagnosis not present

## 2021-01-06 DIAGNOSIS — B349 Viral infection, unspecified: Secondary | ICD-10-CM

## 2021-01-06 HISTORY — DX: Benign neoplasm of peripheral nerves and autonomic nervous system, unspecified: D36.10

## 2021-01-06 NOTE — ED Triage Notes (Signed)
Patient c/o nasal congestion and sinus pressure x 2 days.   Patient denies fever at home. Patient denies sore throat and ear pain.   Patient endorses fatigue. Patient endorses a nonproductive cough.   Patient took an at home COVID test with a negative test result.   Patient has taken Mucinex PM w/ no relief of symptoms.

## 2021-01-06 NOTE — ED Provider Notes (Signed)
Elizabeth Bowers    CSN: [id number] Arrival date & time: 01/06/21  1846      History   Chief Complaint Chief Complaint  Patient presents with   Nasal Congestion   Facial Pain   APPT 1866     HPI Elizabeth Bowers is a 55 y.o. female.  Patient presents with fatigue, congestion, sinus pressure, cough since yesterday.  She denies fever, chills, ear pain, sore throat, shortness of breath, or other symptoms.  Treatment at home with Mucinex.   The history is provided by the patient and medical records.   Past Medical History:  Diagnosis Date   Fatty liver    Fibromyalgia    GERD (gastroesophageal reflux disease)    Hemorrhoids    Hot flashes    Hypertension    IBS (irritable bowel syndrome)    LUQ abdominal pain 10/25/2016   Migraines    Miscarriage    x 2; 4 live births    Multilevel degenerative disc disease    Neuroma    Vocal cord dysfunction     Patient Active Problem List   Diagnosis Date Noted   Acute foot pain (Left) 11/15/2020   Morton's neuroma of foot (Left) 11/15/2020   MDD (major depressive disorder) 10/01/2020   Encounter for annual physical exam 06/07/2020   Anterior tibialis tendinitis, right 04/28/2020   Acute right ankle pain 04/28/2020   Encounter for colonoscopy due to history of adenomatous colonic polyps    Insomnia 07/07/2019   Benign essential hypertension 04/22/2019   Radial styloid tenosynovitis of left hand 01/13/2019   Pain in right hand 01/13/2019   Bilateral carpal tunnel syndrome 01/13/2019   Chronic pain of left wrist 01/13/2019   Chronic low back pain (Bilateral) w/o sciatica 08/08/2018   Abnormal MRI, cervical spine (07/10/2018) 07/17/2018   Cervical spondylitis with radiculitis (HCC) (C6) (Bilateral) (R>L) 06/26/2018   Chronic sacroiliac joint pain (Bilateral) (R>L) 06/26/2018   Cervical Grade 1 (2 mm) Retrolisthesis C5 over C6 03/20/2018   Cervical foraminal stenosis (C3-4 & C5-6) (Bilateral) 03/20/2018   DDD (degenerative  disc disease), cervical 03/20/2018   Long term prescription benzodiazepine use 03/20/2018   Stage 3 chronic kidney disease ([place]) 03/20/2018   Acid reflux disease 03/20/2018   Spondylosis without myelopathy or radiculopathy, cervical region 03/20/2018   Chronic hip pain (Bilateral) (L>R) 03/20/2018   Lumbar facet syndrome (Bilateral) (R>L) 03/20/2018   Spondylosis without myelopathy or radiculopathy, lumbar region 03/20/2018   Chronic Sacroiliac joint dysfunction (Bilateral) (R>L) 03/20/2018   Chronic Somatic dysfunction of sacroiliac joint (Bilateral) (R>L) 03/20/2018   Osteoarthritis of hip (Bilateral) 03/20/2018   Cervicogenic headache (Bilateral) 03/20/2018   Cervicalgia (Bilateral) (R>L) 03/20/2018   Cervical facet syndrome (Bilateral) (R>L) 03/20/2018   Chronic occipital neuralgia (Bilateral) 03/20/2018   Chronic upper extremity pain (Fourth Area of Pain) (Bilateral) (R>L) 03/19/2018   Ganglion cyst 02/20/2018   Chronic low back pain (2ry area of Pain) (Bilateral) (L>R) w/ sciatica (Bilateral) 01/16/2018   Chronic lower extremity pain (3ry area of Pain) (Bilateral) (L>R) 01/16/2018   Chronic neck pain (1ry area of Pain) (Bilateral) (midline) 01/16/2018   Chronic foot pain (Left) 01/16/2018   Chronic pain syndrome 01/16/2018   Pharmacologic therapy 01/16/2018   Disorder of skeletal system 01/16/2018   Problems influencing health status 01/16/2018   Obesity 11/21/2017   Anxiety 09/28/2017   Chronic knee pain (Right) 09/28/2017   Fatty liver 07/09/2017   Colon polyps 07/09/2017   Fibromyalgia 07/09/2017   Sinusitis, chronic 07/09/2017  Allergic rhinitis 07/09/2017   IBS (irritable bowel syndrome) 07/09/2017   Eyelid dermatitis, allergic/contact 07/09/2017   Superficial thrombophlebitis 01/12/2017   Chronic sacroiliac joint pain (Right) 12/16/2016   DDD (degenerative disc disease), lumbar 07/18/2016   Family hx of colon cancer 07/18/2016    Past Surgical History:   Procedure Laterality Date   ABDOMINAL HYSTERECTOMY     2012 removed cervix h/o abnormal pap    CHOLECYSTECTOMY     2000   COLONOSCOPY     2018 with + polpys and GIB 2/2 polyp removal    COLONOSCOPY WITH PROPOFOL N/A 12/22/2019   Procedure: COLONOSCOPY WITH PROPOFOL;  Surgeon: Lin Landsman, MD;  Location: [facility];  Service: Gastroenterology;  Laterality: N/A;   COLONOSCOPY WITH PROPOFOL N/A 12/23/2019   Procedure: COLONOSCOPY WITH PROPOFOL;  Surgeon: Lin Landsman, MD;  Location: [facility] ENDOSCOPY;  Service: Gastroenterology;  Laterality: N/A;  Last name pronounced LEE-MA   ELBOW DEBRIDEMENT     ESOPHAGOGASTRODUODENOSCOPY (EGD) WITH PROPOFOL N/A 02/06/2018   Procedure: ESOPHAGOGASTRODUODENOSCOPY (EGD) WITH PROPOFOL with biopsies;  Surgeon: Lin Landsman, MD;  Location: [place];  Service: Endoscopy;  Laterality: N/A;   FOOT SURGERY      OB History   No obstetric history on file.      Home Medications    Prior to Admission medications   Medication Sig Start Date End Date Taking? Authorizing Provider  amLODipine (NORVASC) 2.5 MG tablet TAKE 1 TABLET BY MOUTH DAILY. 12/13/20 12/13/21 Yes Bacigalupo, [name], MD  buPROPion (WELLBUTRIN XL) 300 MG 24 hr tablet Take 1 tablet (300 mg total) by mouth daily. 10/01/20  Yes Bacigalupo, [name], MD  DULoxetine (CYMBALTA) 60 MG capsule TAKE 1 CAPSULE BY MOUTH DAILY. 11/23/20 11/23/21 Yes Bacigalupo, [name], MD  lansoprazole (PREVACID) 30 MG capsule TAKE 1 CAPSULE BY MOUTH TWICE DAILY BEFORE A MEAL. 02/02/20 02/01/21 Yes Bacigalupo, [name], MD  LINZESS 290 MCG CAPS capsule TAKE 1 CAPSULE BY MOUTH DAILY. 02/02/20 02/01/21 Yes Bacigalupo, [name], MD  Menaquinone-7 (VITAMIN K2 PO) Take by mouth.   Yes [provider]  Multiple Vitamin (MULTIVITAMIN WITH MINERALS) TABS tablet Take 1 tablet by mouth daily.   Yes [provider]  psyllium (METAMUCIL) 58.6 % powder Take 2 packets by mouth daily.   Yes  [provider]  SUMAtriptan (IMITREX) 100 MG tablet Take 100 mg by mouth every 2 (two) hours as needed for migraine. May repeat in 2 hours if headache persists or recurs.    [provider]  triamterene-hydrochlorothiazide (MAXZIDE-25) 37.5-25 MG tablet TAKE 1 TABLET BY MOUTH DAILY. 12/13/20 12/13/21  Virginia Crews, MD  vitamin C (ASCORBIC ACID) 500 MG tablet Take 500 mg by mouth daily.    [provider]    Family History Family History  Problem Relation Age of Onset   Hypertension Mother    Hyperlipidemia Mother    Glaucoma Mother    Diabetes Mother        type 2    COPD Father        emphysema   Alcohol abuse Father    Colon cancer Sister    Cancer Sister        rectal/colon cancer no h/o IBD   Colon polyps Sister    Asthma Sister    COPD Sister    Kidney disease Sister    Mental illness Sister        bipolar    Miscarriages / [place] Sister  Uterine cancer Sister    Alcohol abuse Son    Depression Son    Diabetes Son    Drug abuse Son    Mental illness Son        bipolar    Diabetes Maternal Grandmother    COPD Paternal Grandmother    Cancer Paternal Grandfather        FH colon cancer maternal aunts/uncles    Miscarriages / Stillbirths Sister    Arthritis Son    Arthritis Son    Breast cancer Neg Hx     Social History Social History   Tobacco Use   Smoking status: Former    Packs/day: 0.75    Years: 5.00    Pack years: 3.75    Types: Cigarettes    Quit date: 07/06/1987    Years since quitting: 33.5   Smokeless tobacco: Never  Vaping Use   Vaping Use: Never used  Substance Use Topics   Alcohol use: Yes    Alcohol/week: 2.0 - 4.0 standard drinks    Types: 2 - 4 Cans of beer per week    Comment: wine cooler   Drug use: Never     Allergies   Gluten meal, Lactose, Ranitidine, Amphetamine-dextroamphetamine, Lactose intolerance (gi), Reglan [metoclopramide], Topiramate, Topiramate er, Wheat bran, Soap, and Sulfa  antibiotics   Review of Systems Review of Systems  Constitutional:  Positive for fatigue. Negative for chills and fever.  HENT:  Positive for congestion and sinus pressure. Negative for ear pain and sore throat.   Respiratory:  Positive for cough. Negative for shortness of breath.   Cardiovascular:  Negative for chest pain and palpitations.  Gastrointestinal:  Negative for abdominal pain, diarrhea and vomiting.  Skin:  Negative for color change and rash.  All other systems reviewed and are negative.   Physical Exam Triage Vital Signs ED Triage Vitals  Enc Vitals Group     BP      Pulse      Resp      Temp      Temp src      SpO2      Weight      Height      Head Circumference      Peak Flow      Pain Score      Pain Loc      Pain Edu?      Excl. in [place]?    No data found.  Updated Vital Signs BP (!) 148/94 (BP Location: Left Arm)   Pulse 93   Temp 98.9 F (37.2 C) (Oral)   Resp 18   SpO2 97%   Visual Acuity Right Eye Distance:   Left Eye Distance:   Bilateral Distance:    Right Eye Near:   Left Eye Near:    Bilateral Near:     Physical Exam Vitals and nursing note reviewed.  Constitutional:      General: She is not in acute distress.    Appearance: She is well-developed.  HENT:     Head: Normocephalic and atraumatic.     Right Ear: Tympanic membrane normal.     Left Ear: Tympanic membrane normal.     Nose: Nose normal.     Mouth/Throat:     Mouth: Mucous membranes are moist.     Pharynx: Oropharynx is clear.  Eyes:     Conjunctiva/sclera: Conjunctivae normal.  Cardiovascular:     Rate and Rhythm: Normal rate and regular rhythm.  Heart sounds: Normal heart sounds.  Pulmonary:     Effort: Pulmonary effort is normal. No respiratory distress.     Breath sounds: Normal breath sounds.  Abdominal:     Palpations: Abdomen is soft.     Tenderness: There is no abdominal tenderness.  Musculoskeletal:     Cervical back: Neck supple.  Skin:     General: Skin is warm and dry.  Neurological:     General: No focal deficit present.     Mental Status: She is alert and oriented to person, place, and time.     Gait: Gait normal.  Psychiatric:        Mood and Affect: Mood normal.        Behavior: Behavior normal.     UC Treatments / Results  Labs (all labs ordered are listed, but only abnormal results are displayed) Labs Reviewed  NOVEL CORONAVIRUS, NAA    EKG   Radiology No results found.  Procedures Procedures (including critical care time)  Medications Ordered in UC Medications - No data to display  Initial Impression / Assessment and Plan / UC Course  I have reviewed the triage vital signs and the nursing notes.  Pertinent labs & imaging results that were available during my care of the patient were reviewed by me and considered in my medical decision making (see chart for details).   Viral illness.  COVID pending.  Instructed patient to self quarantine per CDC guidelines.  Antibiotic stewardship discussed.  Discussed symptomatic treatment including Tylenol or ibuprofen, rest, hydration.  Instructed patient to follow up with PCP if symptoms are not improving.  Patient agrees to plan of care.    Final Clinical Impressions(s) / UC Diagnoses   Final diagnoses:  Viral illness     Discharge Instructions      Your COVID test is pending.  You should self quarantine until the test result is back.    Take Tylenol or ibuprofen as needed for fever or discomfort.  Rest and keep yourself hydrated.    Follow-up with your primary care provider if your symptoms are not improving.         ED Prescriptions   None    PDMP not reviewed this encounter.   Sharion Balloon, NP 01/06/21 1921

## 2021-01-06 NOTE — Discharge Instructions (Addendum)
Your COVID test is pending.  You should self quarantine until the test result is back.    Take Tylenol or ibuprofen as needed for fever or discomfort.  Rest and keep yourself hydrated.    Follow-up with your primary care provider if your symptoms are not improving.     

## 2021-01-09 LAB — SARS-COV-2, NAA 2 DAY TAT

## 2021-01-09 LAB — NOVEL CORONAVIRUS, NAA: SARS-CoV-2, NAA: NOT DETECTED

## 2021-01-10 ENCOUNTER — Other Ambulatory Visit (HOSPITAL_COMMUNITY): Payer: Self-pay

## 2021-01-27 ENCOUNTER — Other Ambulatory Visit: Payer: Self-pay | Admitting: Family Medicine

## 2021-01-27 DIAGNOSIS — K581 Irritable bowel syndrome with constipation: Secondary | ICD-10-CM

## 2021-01-27 MED ORDER — BUPROPION HCL ER (XL) 300 MG PO TB24
300.0000 mg | ORAL_TABLET | Freq: Every day | ORAL | 0 refills | Status: DC
  Filled 2021-01-27: qty 90, 90d supply, fill #0

## 2021-01-27 MED ORDER — LINZESS 290 MCG PO CAPS
290.0000 ug | ORAL_CAPSULE | Freq: Every day | ORAL | 0 refills | Status: DC
  Filled 2021-01-27: qty 90, 90d supply, fill #0

## 2021-01-28 ENCOUNTER — Other Ambulatory Visit (HOSPITAL_COMMUNITY): Payer: Self-pay

## 2021-02-01 ENCOUNTER — Telehealth: Payer: Self-pay

## 2021-02-01 ENCOUNTER — Encounter: Payer: Self-pay | Admitting: Family Medicine

## 2021-02-01 NOTE — Telephone Encounter (Signed)
Copied from [place] [id number]. Topic: Appointment Scheduling - Scheduling Inquiry for Clinic >> Feb 01, 2021  9:14 AM Scherrie Gerlach wrote: Reason for CRM: pt states she has sinus infection for over 2 weeks.  She went to urgent care and all they did was test for covid.  Pt declined to see another provider.  Asked if I could send Dr B a message. Pt says the nasal drip is going into her lungs and now she is coughing up green mucus.   Would like [facility] visit or abx for the infection

## 2021-02-01 NOTE — Telephone Encounter (Signed)
Yeah unfortunately I'm not in the office tomorrow. I could probably double book a first appt with a virtual on Thursday if needed, but agree with other options

## 2021-02-01 NOTE — Telephone Encounter (Signed)
Patient advised that we are completley booked for today and tomorrow. I did offer to check with [facility], however patient declined. I also informed that she could do a virtual visit through mychart if going to another clinic was not an option for her. Patient stated "I'll figure something out" and hung up.

## 2021-02-03 ENCOUNTER — Other Ambulatory Visit: Payer: Self-pay

## 2021-02-03 ENCOUNTER — Ambulatory Visit (INDEPENDENT_AMBULATORY_CARE_PROVIDER_SITE_OTHER): Payer: 59 | Admitting: Family Medicine

## 2021-02-03 ENCOUNTER — Encounter: Payer: Self-pay | Admitting: Family Medicine

## 2021-02-03 VITALS — BP 113/79 | HR 97 | Temp 98.8°F | Resp 16 | Ht 62.0 in | Wt 176.5 lb

## 2021-02-03 DIAGNOSIS — J0100 Acute maxillary sinusitis, unspecified: Secondary | ICD-10-CM | POA: Diagnosis not present

## 2021-02-03 NOTE — Progress Notes (Signed)
Established patient visit   Patient: Elizabeth Bowers   DOB: 1965/08/27   55 y.o. Female  MRN: [id number] Visit Date: 02/03/2021  Today's healthcare provider: Lavon Paganini, MD   Chief Complaint  Patient presents with   URI   Subjective  --------------------------------------------------------------------------------------------------------------------    URI  Associated symptoms include congestion, coughing, ear pain, sinus pain and a sore throat. Pertinent negatives include no abdominal pain, chest pain, diarrhea, dysuria, headaches, nausea, neck pain, vomiting or wheezing.    Upper respiratory symptoms She complains of bilateral ear pressure/pain, achiness, congestion, cough described as productive, facial pain, nasal congestion, no  fever, post nasal drip, productive cough with  yellow colored sputum, shortness of breath, sinus pressure, and sore throat.with no fever, chills, night sweats or weight loss. Onset of symptoms was a few weeks ago and worsening.She is drinking plenty of fluids.  Past history is significant for no history of pneumonia or bronchitis. Patient is non-smoker.  She had been taking her husbands prescription doxycycline x 2 days   She had been to the ED and they believed she had COVID and she waited 3 days for results to be negative.   ---------------------------------------------------------------------------------------------------   Patient Active Problem List   Diagnosis Date Noted   Acute foot pain (Left) 11/15/2020   Morton's neuroma of foot (Left) 11/15/2020   MDD (major depressive disorder) 10/01/2020   Anterior tibialis tendinitis, right 04/28/2020   Acute right ankle pain 04/28/2020   Insomnia 07/07/2019   Benign essential hypertension 04/22/2019   Radial styloid tenosynovitis of left hand 01/13/2019   Pain in right hand 01/13/2019   Bilateral carpal tunnel syndrome 01/13/2019   Chronic pain of left wrist 01/13/2019   Chronic low  back pain (Bilateral) w/o sciatica 08/08/2018   Abnormal MRI, cervical spine (07/10/2018) 07/17/2018   Cervical spondylitis with radiculitis (HCC) (C6) (Bilateral) (R>L) 06/26/2018   Chronic sacroiliac joint pain (Bilateral) (R>L) 06/26/2018   Cervical Grade 1 (2 mm) Retrolisthesis C5 over C6 03/20/2018   Cervical foraminal stenosis (C3-4 & C5-6) (Bilateral) 03/20/2018   DDD (degenerative disc disease), cervical 03/20/2018   Long term prescription benzodiazepine use 03/20/2018   Stage 3 chronic kidney disease ([place]) 03/20/2018   Acid reflux disease 03/20/2018   Spondylosis without myelopathy or radiculopathy, cervical region 03/20/2018   Chronic hip pain (Bilateral) (L>R) 03/20/2018   Lumbar facet syndrome (Bilateral) (R>L) 03/20/2018   Spondylosis without myelopathy or radiculopathy, lumbar region 03/20/2018   Chronic Sacroiliac joint dysfunction (Bilateral) (R>L) 03/20/2018   Chronic Somatic dysfunction of sacroiliac joint (Bilateral) (R>L) 03/20/2018   Osteoarthritis of hip (Bilateral) 03/20/2018   Cervicogenic headache (Bilateral) 03/20/2018   Cervicalgia (Bilateral) (R>L) 03/20/2018   Cervical facet syndrome (Bilateral) (R>L) 03/20/2018   Chronic occipital neuralgia (Bilateral) 03/20/2018   Chronic upper extremity pain (Fourth Area of Pain) (Bilateral) (R>L) 03/19/2018   Ganglion cyst 02/20/2018   Chronic low back pain (2ry area of Pain) (Bilateral) (L>R) w/ sciatica (Bilateral) 01/16/2018   Chronic lower extremity pain (3ry area of Pain) (Bilateral) (L>R) 01/16/2018   Chronic neck pain (1ry area of Pain) (Bilateral) (midline) 01/16/2018   Chronic foot pain (Left) 01/16/2018   Chronic pain syndrome 01/16/2018   Pharmacologic therapy 01/16/2018   Disorder of skeletal system 01/16/2018   Problems influencing health status 01/16/2018   Obesity 11/21/2017   Anxiety 09/28/2017   Chronic knee pain (Right) 09/28/2017   Fatty liver 07/09/2017   Colon polyps 07/09/2017   Fibromyalgia  07/09/2017   Sinusitis,  chronic 07/09/2017   Allergic rhinitis 07/09/2017   IBS (irritable bowel syndrome) 07/09/2017   Eyelid dermatitis, allergic/contact 07/09/2017   Superficial thrombophlebitis 01/12/2017   Chronic sacroiliac joint pain (Right) 12/16/2016   DDD (degenerative disc disease), lumbar 07/18/2016   Family hx of colon cancer 07/18/2016   Social History   Tobacco Use   Smoking status: Former    Packs/day: 0.75    Years: 5.00    Pack years: 3.75    Types: Cigarettes    Quit date: 07/06/1987    Years since quitting: 33.6   Smokeless tobacco: Never  Vaping Use   Vaping Use: Never used  Substance Use Topics   Alcohol use: Yes    Alcohol/week: 2.0 - 4.0 standard drinks    Types: 2 - 4 Cans of beer per week    Comment: wine cooler   Drug use: Never   Allergies  Allergen Reactions   Gluten Meal     Other reaction(s): Abdominal Pain, Myalgias (Muscle Pain), Other (see comments) Other reaction(s): Other (See Comments)    Lactose     Other reaction(s): Abdominal Pain, Myalgias (Muscle Pain), Other (see comments) Other reaction(s): Other (See Comments)    Ranitidine Diarrhea and Other (See Comments)    Diarrhea and stomach cramping   Amphetamine-Dextroamphetamine Other (See Comments)    Elevated BP   Lactose Intolerance (Gi)     bloating   Reglan [Metoclopramide] Other (See Comments)    Increased prolactin level    Topiramate    Topiramate Er Other (See Comments)    Severe headache, nausea, vomiting, dizziness, visual disturbance     Wheat Bran     bloating   Soap Rash    PROVON excess use   Sulfa Antibiotics Anxiety      Medications: Outpatient Medications Prior to Visit  Medication Sig   amLODipine (NORVASC) 2.5 MG tablet TAKE 1 TABLET BY MOUTH DAILY.   buPROPion (WELLBUTRIN XL) 300 MG 24 hr tablet Take 1 tablet (300 mg total) by mouth daily.   DULoxetine (CYMBALTA) 60 MG capsule TAKE 1 CAPSULE BY MOUTH DAILY.   LINZESS 290 MCG CAPS capsule TAKE 1  CAPSULE BY MOUTH DAILY.   Menaquinone-7 (VITAMIN K2 PO) Take by mouth.   Multiple Vitamin (MULTIVITAMIN WITH MINERALS) TABS tablet Take 1 tablet by mouth daily.   psyllium (METAMUCIL) 58.6 % powder Take 2 packets by mouth daily.   SUMAtriptan (IMITREX) 100 MG tablet Take 100 mg by mouth every 2 (two) hours as needed for migraine. May repeat in 2 hours if headache persists or recurs.   triamterene-hydrochlorothiazide (MAXZIDE-25) 37.5-25 MG tablet TAKE 1 TABLET BY MOUTH DAILY.   vitamin C (ASCORBIC ACID) 500 MG tablet Take 500 mg by mouth daily.   lansoprazole (PREVACID) 30 MG capsule TAKE 1 CAPSULE BY MOUTH TWICE DAILY BEFORE A MEAL.   No facility-administered medications prior to visit.    Review of Systems  Constitutional:  Positive for activity change, appetite change and fatigue. Negative for chills and fever.  HENT:  Positive for congestion, ear pain, postnasal drip, sinus pressure, sinus pain and sore throat.   Eyes:  Negative for pain and visual disturbance.  Respiratory:  Positive for cough and shortness of breath. Negative for chest tightness and wheezing.   Cardiovascular:  Negative for chest pain, palpitations and leg swelling.  Gastrointestinal:  Negative for abdominal pain, blood in stool, diarrhea, nausea and vomiting.  Genitourinary:  Negative for dysuria, flank pain, frequency, pelvic pain and urgency.  Musculoskeletal:  Negative for back pain, myalgias and neck pain.  Neurological:  Negative for dizziness, weakness, light-headedness, numbness and headaches.       Objective  -------------------------------------------------------------------------------------------------------------------- BP 113/79 (BP Location: Left Arm, Patient Position: Sitting, Cuff Size: Large)   Pulse 97   Temp 98.8 F (37.1 C) (Oral)   Resp 16   Ht '5\' 2"'$  (1.575 m)   Wt 176 lb 8 oz (80.1 kg)   SpO2 98%   BMI 32.28 kg/m  BP Readings from Last 3 Encounters:  02/03/21 113/79  01/06/21 (!)  148/94  11/15/20 (!) 144/98   Wt Readings from Last 3 Encounters:  02/03/21 176 lb 8 oz (80.1 kg)  11/15/20 175 lb (79.4 kg)  10/01/20 180 lb 14.4 oz (82.1 kg)      Physical Exam Vitals reviewed.  Constitutional:      General: She is not in acute distress.    Appearance: Normal appearance. She is well-developed. She is not diaphoretic.  HENT:     Head: Normocephalic and atraumatic.     Right Ear: Tympanic membrane, ear canal and external ear normal.     Left Ear: Tympanic membrane, ear canal and external ear normal.     Nose: Congestion present.     Mouth/Throat:     Pharynx: Oropharynx is clear. Posterior oropharyngeal erythema present. No oropharyngeal exudate.  Eyes:     General: No scleral icterus.    Conjunctiva/sclera: Conjunctivae normal.  Neck:     Thyroid: No thyromegaly.  Cardiovascular:     Rate and Rhythm: Normal rate and regular rhythm.     Pulses: Normal pulses.     Heart sounds: Normal heart sounds. No murmur heard. Pulmonary:     Effort: Pulmonary effort is normal. No respiratory distress.     Breath sounds: Normal breath sounds. No wheezing, rhonchi or rales.  Musculoskeletal:     Cervical back: Neck supple.     Right lower leg: No edema.     Left lower leg: No edema.  Lymphadenopathy:     Cervical: No cervical adenopathy.  Skin:    General: Skin is warm and dry.     Findings: No rash.  Neurological:     Mental Status: She is alert and oriented to person, place, and time. Mental status is at baseline.  Psychiatric:        Mood and Affect: Mood normal.        Behavior: Behavior normal.      No results found for any visits on 02/03/21.  Assessment & Plan  ---------------------------------------------------------------------------------------------------------------------- Problem List Items Addressed This Visit   None Visit Diagnoses     Acute non-recurrent maxillary sinusitis    -  Primary     - symptoms and exam c/w sinusitis   - no  evidence of AOM, CAP, strep pharyngitis, or other infection - given duration of symptoms, suspect bacterial etiology - will treat with Doxycycline x7d (has Rx at home already and declines need for one to be written today) - discussed symptomatic management (flonase, decongestants, etc), natural course, and return precautions     Return for as scheduled.      I,Essence Turner,acting as a Education administrator for Lavon Paganini, MD.,have documented all relevant documentation on the behalf of Lavon Paganini, MD,as directed by  Lavon Paganini, MD while in the presence of Lavon Paganini, MD.   I, Lavon Paganini, MD, have reviewed all documentation for this visit. The documentation on 02/03/21 for the exam, diagnosis, procedures, and orders are all  accurate and complete.   Anastasija Anfinson, [name], MD, MPH [facility]

## 2021-02-04 DIAGNOSIS — M792 Neuralgia and neuritis, unspecified: Secondary | ICD-10-CM | POA: Diagnosis not present

## 2021-02-04 DIAGNOSIS — M79672 Pain in left foot: Secondary | ICD-10-CM | POA: Diagnosis not present

## 2021-02-04 DIAGNOSIS — G894 Chronic pain syndrome: Secondary | ICD-10-CM | POA: Diagnosis not present

## 2021-02-04 DIAGNOSIS — G5762 Lesion of plantar nerve, left lower limb: Secondary | ICD-10-CM | POA: Diagnosis not present

## 2021-02-10 ENCOUNTER — Other Ambulatory Visit (HOSPITAL_COMMUNITY): Payer: Self-pay

## 2021-02-16 ENCOUNTER — Other Ambulatory Visit: Payer: Self-pay | Admitting: Family Medicine

## 2021-02-16 ENCOUNTER — Other Ambulatory Visit (HOSPITAL_COMMUNITY): Payer: Self-pay

## 2021-02-16 DIAGNOSIS — K219 Gastro-esophageal reflux disease without esophagitis: Secondary | ICD-10-CM

## 2021-02-16 MED ORDER — LANSOPRAZOLE 30 MG PO CPDR
DELAYED_RELEASE_CAPSULE | ORAL | 0 refills | Status: DC
  Filled 2021-02-16: qty 180, 90d supply, fill #0

## 2021-02-17 ENCOUNTER — Telehealth: Payer: Self-pay

## 2021-02-17 NOTE — Telephone Encounter (Signed)
Copied from [place] [id number]. Topic: General - Other >> Feb 17, 2021  2:16 PM Valere Dross wrote: Reason for CRM: Pt called in stating she has a really bad headache and vomiting, and wanted someone to reach out to her about getting a sooner appt or possibly something prescribed. Please advise.

## 2021-02-17 NOTE — Telephone Encounter (Signed)
Can see what is available and what we can offer her.  I am not in the office tomorrow or next week.  Could see if [place] or [place] would be a good option

## 2021-02-18 ENCOUNTER — Encounter: Payer: Self-pay | Admitting: Family Medicine

## 2021-02-25 ENCOUNTER — Telehealth: Payer: 59 | Admitting: Family Medicine

## 2021-03-16 ENCOUNTER — Other Ambulatory Visit (HOSPITAL_COMMUNITY): Payer: Self-pay

## 2021-03-16 ENCOUNTER — Other Ambulatory Visit: Payer: Self-pay | Admitting: Family Medicine

## 2021-03-16 MED ORDER — AMLODIPINE BESYLATE 2.5 MG PO TABS
ORAL_TABLET | Freq: Every day | ORAL | 2 refills | Status: DC
  Filled 2021-03-16: qty 30, 30d supply, fill #0
  Filled 2021-04-13: qty 30, 30d supply, fill #1
  Filled 2021-05-11: qty 30, 30d supply, fill #2

## 2021-03-16 NOTE — Telephone Encounter (Signed)
Requested Prescriptions  Pending Prescriptions Disp Refills  . amLODipine (NORVASC) 2.5 MG tablet 30 tablet 2    Sig: TAKE 1 TABLET BY MOUTH DAILY.     Cardiovascular:  Calcium Channel Blockers Passed - 03/16/2021 11:00 AM      Passed - Last BP in normal range    BP Readings from Last 1 Encounters:  02/03/21 113/79         Passed - Valid encounter within last 6 months    Recent Outpatient Visits          1 month ago Acute non-recurrent maxillary sinusitis   [facility] [place], [name], MD   5 months ago Essential hypertension   [facility] [place], [name], MD   9 months ago Encounter for annual physical exam   [facility] [place], [name], MD   9 months ago Hypertension, unspecified type   [place], PA-C   10 months ago Acute right ankle pain   [place], Kelby Aline, FNP      Future Appointments            In 2 weeks Bacigalupo, [name], MD [facility], [place]

## 2021-03-17 ENCOUNTER — Other Ambulatory Visit (HOSPITAL_COMMUNITY): Payer: Self-pay

## 2021-03-18 ENCOUNTER — Other Ambulatory Visit (HOSPITAL_COMMUNITY): Payer: Self-pay

## 2021-04-05 ENCOUNTER — Ambulatory Visit (INDEPENDENT_AMBULATORY_CARE_PROVIDER_SITE_OTHER): Payer: 59 | Admitting: Family Medicine

## 2021-04-05 ENCOUNTER — Encounter: Payer: Self-pay | Admitting: Family Medicine

## 2021-04-05 ENCOUNTER — Other Ambulatory Visit: Payer: Self-pay

## 2021-04-05 VITALS — BP 144/91 | HR 66 | Temp 98.8°F | Resp 16 | Ht 62.0 in | Wt 176.0 lb

## 2021-04-05 DIAGNOSIS — E661 Drug-induced obesity: Secondary | ICD-10-CM

## 2021-04-05 DIAGNOSIS — Z6833 Body mass index (BMI) 33.0-33.9, adult: Secondary | ICD-10-CM | POA: Diagnosis not present

## 2021-04-05 DIAGNOSIS — F331 Major depressive disorder, recurrent, moderate: Secondary | ICD-10-CM

## 2021-04-05 DIAGNOSIS — Z6832 Body mass index (BMI) 32.0-32.9, adult: Secondary | ICD-10-CM

## 2021-04-05 DIAGNOSIS — Z1159 Encounter for screening for other viral diseases: Secondary | ICD-10-CM

## 2021-04-05 DIAGNOSIS — I10 Essential (primary) hypertension: Secondary | ICD-10-CM | POA: Diagnosis not present

## 2021-04-05 DIAGNOSIS — Z1231 Encounter for screening mammogram for malignant neoplasm of breast: Secondary | ICD-10-CM | POA: Diagnosis not present

## 2021-04-05 DIAGNOSIS — E669 Obesity, unspecified: Secondary | ICD-10-CM | POA: Diagnosis not present

## 2021-04-05 DIAGNOSIS — N1831 Chronic kidney disease, stage 3a: Secondary | ICD-10-CM

## 2021-04-05 DIAGNOSIS — Z0184 Encounter for antibody response examination: Secondary | ICD-10-CM | POA: Diagnosis not present

## 2021-04-05 DIAGNOSIS — R739 Hyperglycemia, unspecified: Secondary | ICD-10-CM

## 2021-04-05 DIAGNOSIS — K760 Fatty (change of) liver, not elsewhere classified: Secondary | ICD-10-CM

## 2021-04-05 NOTE — Progress Notes (Signed)
Established patient visit   Patient: Elizabeth Bowers   DOB: 26-Jan-1966   55 y.o. Female  MRN: [id number] Visit Date: 04/05/2021  Today's healthcare provider: Lavon Paganini, MD   Chief Complaint  Patient presents with   Annual Exam   Follow-up    Subjective    HPI  Hypertension, follow-up  BP Readings from Last 3 Encounters:  04/05/21 (!) 144/91  02/03/21 113/79  01/06/21 (!) 148/94   Wt Readings from Last 3 Encounters:  04/05/21 176 lb (79.8 kg)  02/03/21 176 lb 8 oz (80.1 kg)  11/15/20 175 lb (79.4 kg)     Elizabeth Bowers was last seen for hypertension 6 months ago.  BP at that visit was 130/87. Management since that visit includes well controlled, continue current medications.  Elizabeth Bowers reports excellent compliance with treatment. Elizabeth Bowers is not having side effects.  Elizabeth Bowers is following a Low Sodium diet. Elizabeth Bowers is not exercising. Elizabeth Bowers does not smoke.  Use of agents associated with hypertension: none.   Outside blood pressures are not being check. Symptoms: No chest pain No chest pressure  No palpitations No syncope  No dyspnea No orthopnea  No paroxysmal nocturnal dyspnea No lower extremity edema   Pertinent labs: Lab Results  Component Value Date   CHOL 112 03/24/2020   HDL 45 03/24/2020   LDLCALC 54 03/24/2020   TRIG 64 03/24/2020   CHOLHDL 2.5 03/24/2020   Lab Results  Component Value Date   NA 141 10/01/2020   K 5.1 10/01/2020   CREATININE 0.97 10/01/2020   EGFR 69 10/01/2020   GLUCOSE 103 (H) 10/01/2020     The ASCVD Risk score (Arnett DK, et al., 2019) failed to calculate for the following reasons:   The valid total cholesterol range is 130 to 320 mg/dL   ---------------------------------------------------------------------------------------------------  Anxiety, Follow-up  Elizabeth Bowers was last seen for anxiety 6 months ago. Changes made at last visit include encourage therapy, continue Cymbalta, increased Wellbutrin to 353m.   Elizabeth Bowers reports excellent compliance  with treatment. Elizabeth Bowers reports excellent tolerance of treatment. Elizabeth Bowers is not having side effects.   Elizabeth Bowers feels her anxiety is moderate and Improved since last visit.  Symptoms: No chest pain No difficulty concentrating  No dizziness No fatigue  No feelings of losing control No insomnia  No irritable No palpitations  No panic attacks No racing thoughts  No shortness of breath No sweating  Yes tremors/shakes    GAD-7 Results GAD-7 Generalized Anxiety Disorder Screening Tool 10/01/2020  1. Feeling Nervous, Anxious, or on Edge 2  2. Not Being Able to Stop or Control Worrying 0  3. Worrying Too Much About Different Things 2  4. Trouble Relaxing 0  5. Being So Restless it's Hard To Sit Still 0  6. Becoming Easily Annoyed or Irritable 2  7. Feeling Afraid As If Something Awful Might Happen 2  Total GAD-7 Score 8  Difficulty At Work, Home, or Getting  Along With Others? Not difficult at all    PHQ-9 Scores PHQ9 SCORE ONLY 04/05/2021 02/03/2021 11/15/2020  PHQ-9 Total Score 7 11 0    ---------------------------------------------------------------------------------------------------  Depression, Follow-up  Elizabeth Bowers  was last seen for this 6 months ago. Changes made at last visit include encourage therapy, increased Wellbutrin to 300 mg daily.   Elizabeth Bowers reports excellent compliance with treatment. Elizabeth Bowers is not having side effects.   Elizabeth Bowers reports excellent tolerance of treatment. Current symptoms include: insomnia Elizabeth Bowers feels Elizabeth Bowers is Improved since last visit.  Depression screen  [facility] 2/9 04/05/2021 02/03/2021 11/15/2020  Decreased Interest 1 1 0  Down, Depressed, Hopeless 0 1 0  PHQ - 2 Score 1 2 0  Altered sleeping 2 3 -  Tired, decreased energy 1 3 -  Change in appetite 2 1 -  Feeling bad or failure about yourself  0 1 -  Trouble concentrating 0 1 -  Moving slowly or fidgety/restless 1 0 -  Suicidal thoughts 0 0 -  PHQ-9 Score 7 11 -  Difficult doing work/chores Not difficult at all Somewhat  difficult -  Some recent data might be hidden    -----------------------------------------------------------------------------------------     Medications: Outpatient Medications Prior to Visit  Medication Sig   amLODipine (NORVASC) 2.5 MG tablet TAKE 1 TABLET BY MOUTH DAILY.   buPROPion (WELLBUTRIN XL) 300 MG 24 hr tablet Take 1 tablet (300 mg total) by mouth daily.   DULoxetine (CYMBALTA) 60 MG capsule TAKE 1 CAPSULE BY MOUTH DAILY.   lansoprazole (PREVACID) 30 MG capsule TAKE 1 CAPSULE BY MOUTH TWICE DAILY BEFORE A MEAL.   LINZESS 290 MCG CAPS capsule TAKE 1 CAPSULE BY MOUTH DAILY.   Menaquinone-7 (VITAMIN K2 PO) Take by mouth.   Multiple Vitamin (MULTIVITAMIN WITH MINERALS) TABS tablet Take 1 tablet by mouth daily.   psyllium (METAMUCIL) 58.6 % powder Take 2 packets by mouth daily.   SUMAtriptan (IMITREX) 100 MG tablet Take 100 mg by mouth every 2 (two) hours as needed for migraine. May repeat in 2 hours if headache persists or recurs.   triamterene-hydrochlorothiazide (MAXZIDE-25) 37.5-25 MG tablet TAKE 1 TABLET BY MOUTH DAILY.   vitamin C (ASCORBIC ACID) 500 MG tablet Take 500 mg by mouth daily.   No facility-administered medications prior to visit.    Review of Systems  Endocrine: Positive for polyuria.  All other systems reviewed and are negative.     Objective    BP (!) 144/91 (BP Location: Right Arm, Patient Position: Sitting, Cuff Size: Large)   Pulse 66   Temp 98.8 F (37.1 C) (Oral)   Resp 16   Ht '5\' 2"'  (1.575 m)   Wt 176 lb (79.8 kg)   SpO2 99%   BMI 32.19 kg/m  {Show previous vital signs (optional):23777}  Physical Exam Vitals reviewed.  Constitutional:      General: Elizabeth Bowers is not in acute distress.    Appearance: Normal appearance. Elizabeth Bowers is well-developed. Elizabeth Bowers is not diaphoretic.  HENT:     Head: Normocephalic and atraumatic.  Eyes:     General: No scleral icterus.    Conjunctiva/sclera: Conjunctivae normal.  Neck:     Thyroid: No thyromegaly.   Cardiovascular:     Rate and Rhythm: Normal rate and regular rhythm.     Pulses: Normal pulses.     Heart sounds: Normal heart sounds. No murmur heard. Pulmonary:     Effort: Pulmonary effort is normal. No respiratory distress.     Breath sounds: Normal breath sounds. No wheezing, rhonchi or rales.  Musculoskeletal:     Cervical back: Neck supple.     Right lower leg: No edema.     Left lower leg: No edema.  Lymphadenopathy:     Cervical: No cervical adenopathy.  Skin:    General: Skin is warm and dry.     Findings: No rash.  Neurological:     Mental Status: Elizabeth Bowers is alert and oriented to person, place, and time. Mental status is at baseline.  Psychiatric:        Mood and  Affect: Mood normal.        Behavior: Behavior normal.      No results found for any visits on 04/05/21.  Assessment & Plan     Problem List Items Addressed This Visit       Cardiovascular and Mediastinum   Benign essential hypertension - Primary    Well controlled typically, but slightly elevated today Check home BPs Continue current medications Recheck metabolic panel F/u in 6 months or sooner if remains elevated      Relevant Orders   Lipid panel   Comprehensive metabolic panel     Digestive   Fatty liver    Continue to monitor LFTs      Relevant Orders   Comprehensive metabolic panel     Genitourinary   Stage 3 chronic kidney disease (HCC)    Chronic and stable Recheck metabolic panel Avoid nephrotoxic meds       Relevant Orders   Comprehensive metabolic panel     Other   Obesity    Discussed importance of healthy weight management Discussed diet and exercise       Relevant Orders   Lipid panel   Comprehensive metabolic panel   MDD (major depressive disorder)    Chronic and stable Continue cymbalta and wellbutrin at current dose Encourage therapy      Other Visit Diagnoses     Need for hepatitis C screening test       Relevant Orders   Hepatitis C Antibody    Screening mammogram for breast cancer       Relevant Orders   MM 3D SCREEN BREAST BILATERAL   Hyperglycemia       Relevant Orders   Hemoglobin A1c   COVID-19 virus IgG antibody test result unknown       Relevant Orders   SARS-CoV-2 Antibodies        Return in about 6 months (around 10/03/2021) for CPE.      I, Lavon Paganini, MD, have reviewed all documentation for this visit. The documentation on 04/05/21 for the exam, diagnosis, procedures, and orders are all accurate and complete.   Analys Ryden, [name], MD, MPH [facility]

## 2021-04-05 NOTE — Assessment & Plan Note (Signed)
Continue to monitor LFTs 

## 2021-04-05 NOTE — Assessment & Plan Note (Signed)
Discussed importance of healthy weight management Discussed diet and exercise  

## 2021-04-05 NOTE — Assessment & Plan Note (Signed)
Chronic and stable Continue cymbalta and wellbutrin at current dose Encourage therapy

## 2021-04-05 NOTE — Assessment & Plan Note (Signed)
Well controlled typically, but slightly elevated today Check home BPs Continue current medications Recheck metabolic panel F/u in 6 months or sooner if remains elevated

## 2021-04-05 NOTE — Assessment & Plan Note (Signed)
Chronic and stable Recheck metabolic panel Avoid nephrotoxic meds  

## 2021-04-06 LAB — COMPREHENSIVE METABOLIC PANEL
ALT: 32 IU/L (ref 0–32)
AST: 24 IU/L (ref 0–40)
Albumin/Globulin Ratio: 2.2 (ref 1.2–2.2)
Albumin: 4.7 g/dL (ref 3.8–4.9)
Alkaline Phosphatase: 105 IU/L (ref 44–121)
BUN/Creatinine Ratio: 9 (ref 9–23)
BUN: 11 mg/dL (ref 6–24)
Bilirubin Total: 0.4 mg/dL (ref 0.0–1.2)
CO2: 27 mmol/L (ref 20–29)
Calcium: 9.8 mg/dL (ref 8.7–10.2)
Chloride: 102 mmol/L (ref 96–106)
Creatinine, Ser: 1.18 mg/dL — ABNORMAL HIGH (ref 0.57–1.00)
Globulin, Total: 2.1 g/dL (ref 1.5–4.5)
Glucose: 101 mg/dL — ABNORMAL HIGH (ref 70–99)
Potassium: 4.1 mmol/L (ref 3.5–5.2)
Sodium: 141 mmol/L (ref 134–144)
Total Protein: 6.8 g/dL (ref 6.0–8.5)
eGFR: 54 mL/min/{1.73_m2} — ABNORMAL LOW (ref >59)

## 2021-04-06 LAB — HEPATITIS C ANTIBODY: Hep C Virus Ab: <0.1 s/co ratio (ref 0.0–0.9)

## 2021-04-06 LAB — LIPID PANEL
Chol/HDL Ratio: 2.4 ratio (ref 0.0–4.4)
Cholesterol, Total: 120 mg/dL (ref 100–199)
HDL: 50 mg/dL (ref >39)
LDL Chol Calc (NIH): 57 mg/dL (ref 0–99)
Triglycerides: 57 mg/dL (ref 0–149)
VLDL Cholesterol Cal: 13 mg/dL (ref 5–40)

## 2021-04-06 LAB — HEMOGLOBIN A1C
Est. average glucose Bld gHb Est-mCnc: 105 mg/dL
Hgb A1c MFr Bld: 5.3 % (ref 4.8–5.6)

## 2021-04-06 LAB — SARS-COV-2 ANTIBODIES: SARS-CoV-2 Antibodies: NEGATIVE

## 2021-04-08 ENCOUNTER — Telehealth: Payer: Self-pay

## 2021-04-08 NOTE — Telephone Encounter (Signed)
Copied from [place] [phone] [id number]. Topic: General - Other >> Apr 08, 2021 10:54 AM Yvette Rack wrote: Reason for CRM: Pt stated she was returning call for lab results. Pt stated she is at work and not able to receive calls so she will try calling back on her next break to reach someone to go over her lab results.

## 2021-04-13 ENCOUNTER — Ambulatory Visit: Payer: 59 | Admitting: Physician Assistant

## 2021-04-14 ENCOUNTER — Other Ambulatory Visit (HOSPITAL_COMMUNITY): Payer: Self-pay

## 2021-04-21 ENCOUNTER — Ambulatory Visit: Payer: 59 | Admitting: Family Medicine

## 2021-04-26 ENCOUNTER — Other Ambulatory Visit: Payer: Self-pay | Admitting: Family Medicine

## 2021-04-26 DIAGNOSIS — K581 Irritable bowel syndrome with constipation: Secondary | ICD-10-CM

## 2021-04-27 ENCOUNTER — Telehealth: Payer: 59 | Admitting: Family

## 2021-04-27 ENCOUNTER — Other Ambulatory Visit: Payer: Self-pay

## 2021-04-27 ENCOUNTER — Other Ambulatory Visit (HOSPITAL_COMMUNITY): Payer: Self-pay

## 2021-04-27 DIAGNOSIS — J019 Acute sinusitis, unspecified: Secondary | ICD-10-CM

## 2021-04-27 MED ORDER — LINZESS 290 MCG PO CAPS
290.0000 ug | ORAL_CAPSULE | Freq: Every day | ORAL | 0 refills | Status: DC
  Filled 2021-04-27: qty 90, 90d supply, fill #0

## 2021-04-27 MED ORDER — AMOXICILLIN-POT CLAVULANATE 875-125 MG PO TABS
1.0000 | ORAL_TABLET | Freq: Two times a day (BID) | ORAL | 0 refills | Status: DC
Start: 2021-04-27 — End: 2021-05-05
  Filled 2021-04-27: qty 14, 7d supply, fill #0

## 2021-04-27 MED ORDER — BUPROPION HCL ER (XL) 300 MG PO TB24
300.0000 mg | ORAL_TABLET | Freq: Every day | ORAL | 0 refills | Status: DC
  Filled 2021-04-27: qty 90, 90d supply, fill #0

## 2021-04-27 NOTE — Telephone Encounter (Signed)
Requested Prescriptions  Pending Prescriptions Disp Refills  . buPROPion (WELLBUTRIN XL) 300 MG 24 hr tablet 90 tablet 0    Sig: Take 1 tablet (300 mg total) by mouth daily.     Psychiatry: Antidepressants - bupropion Failed - 04/26/2021  9:43 PM      Failed - Last BP in normal range    BP Readings from Last 1 Encounters:  04/05/21 (!) 144/91         Passed - Completed PHQ-2 or PHQ-9 in the last 360 days      Passed - Valid encounter within last 6 months    Recent Outpatient Visits          3 weeks ago Benign essential hypertension   [facility] [place], [name], MD   2 months ago Acute non-recurrent maxillary sinusitis   [facility] [place], [name], MD   6 months ago Essential hypertension   [facility] [place], [name], MD   10 months ago Encounter for annual physical exam   [facility] [place], [name], MD   11 months ago Hypertension, unspecified type   [place], Vickki Muff, PA-C      Future Appointments            In 5 days Bacigalupo, [name], MD [facility], [place]   In 5 months Bacigalupo, [name], MD [facility]           . Rolan Lipa 290 MCG CAPS capsule 90 capsule 0    Sig: TAKE 1 CAPSULE BY MOUTH DAILY.     Gastroenterology: Irritable Bowel Syndrome Passed - 04/26/2021  9:43 PM      Passed - Valid encounter within last 12 months    Recent Outpatient Visits          3 weeks ago Benign essential hypertension   [place], [name], MD   2 months ago Acute non-recurrent maxillary sinusitis   [facility] [place], [name], MD   6 months ago Essential hypertension   [facility] [place], [name], MD   10 months ago Encounter for annual physical exam   [facility], [name], MD   11 months ago Hypertension, unspecified type   [place], Vickki Muff, PA-C      Future Appointments            In 5 days Bacigalupo, [name], MD [facility], [place]   In 5 months Bacigalupo, [name], MD [facility], [place]

## 2021-04-27 NOTE — Progress Notes (Signed)

## 2021-05-02 ENCOUNTER — Ambulatory Visit: Payer: 59 | Admitting: Family Medicine

## 2021-05-05 ENCOUNTER — Ambulatory Visit: Payer: Self-pay

## 2021-05-05 ENCOUNTER — Encounter: Payer: Self-pay | Admitting: Emergency Medicine

## 2021-05-05 ENCOUNTER — Telehealth: Payer: Self-pay | Admitting: Internal Medicine

## 2021-05-05 ENCOUNTER — Ambulatory Visit (INDEPENDENT_AMBULATORY_CARE_PROVIDER_SITE_OTHER): Payer: 59

## 2021-05-05 ENCOUNTER — Other Ambulatory Visit: Payer: Self-pay

## 2021-05-05 ENCOUNTER — Ambulatory Visit
Admission: EM | Admit: 2021-05-05 | Discharge: 2021-05-05 | Disposition: A | Discharge status: Home / Self Care | Payer: 59 | Attending: Internal Medicine | Admitting: Internal Medicine

## 2021-05-05 ENCOUNTER — Telehealth: Payer: 59 | Admitting: Physician Assistant

## 2021-05-05 DIAGNOSIS — E860 Dehydration: Secondary | ICD-10-CM

## 2021-05-05 DIAGNOSIS — R0602 Shortness of breath: Secondary | ICD-10-CM | POA: Diagnosis not present

## 2021-05-05 DIAGNOSIS — K5289 Other specified noninfective gastroenteritis and colitis: Secondary | ICD-10-CM | POA: Diagnosis not present

## 2021-05-05 DIAGNOSIS — Z20822 Contact with and (suspected) exposure to covid-19: Secondary | ICD-10-CM | POA: Insufficient documentation

## 2021-05-05 DIAGNOSIS — K529 Noninfective gastroenteritis and colitis, unspecified: Secondary | ICD-10-CM | POA: Diagnosis not present

## 2021-05-05 DIAGNOSIS — J069 Acute upper respiratory infection, unspecified: Secondary | ICD-10-CM

## 2021-05-05 DIAGNOSIS — R1115 Cyclical vomiting syndrome unrelated to migraine: Secondary | ICD-10-CM | POA: Insufficient documentation

## 2021-05-05 DIAGNOSIS — R059 Cough, unspecified: Secondary | ICD-10-CM

## 2021-05-05 DIAGNOSIS — R051 Acute cough: Secondary | ICD-10-CM | POA: Diagnosis present

## 2021-05-05 DIAGNOSIS — R197 Diarrhea, unspecified: Secondary | ICD-10-CM | POA: Diagnosis present

## 2021-05-05 LAB — CBC WITH DIFFERENTIAL/PLATELET
Abs Immature Granulocytes: 0.02 10*3/uL (ref 0.00–0.07)
Basophils Absolute: 0 10*3/uL (ref 0.0–0.1)
Basophils Relative: 1 %
Eosinophils Absolute: 0 10*3/uL (ref 0.0–0.5)
Eosinophils Relative: 1 %
HCT: 46.8 % — ABNORMAL HIGH (ref 36.0–46.0)
Hemoglobin: 16.2 g/dL — ABNORMAL HIGH (ref 12.0–15.0)
Immature Granulocytes: 0 %
Lymphocytes Relative: 27 %
Lymphs Abs: 1.6 10*3/uL (ref 0.7–4.0)
MCH: 30.7 pg (ref 26.0–34.0)
MCHC: 34.6 g/dL (ref 30.0–36.0)
MCV: 88.8 fL (ref 80.0–100.0)
Monocytes Absolute: 0.9 10*3/uL (ref 0.1–1.0)
Monocytes Relative: 15 %
Neutro Abs: 3.2 10*3/uL (ref 1.7–7.7)
Neutrophils Relative %: 56 %
Platelets: 289 10*3/uL (ref 150–400)
RBC: 5.27 MIL/uL — ABNORMAL HIGH (ref 3.87–5.11)
RDW: 12.8 % (ref 11.5–15.5)
WBC: 5.7 10*3/uL (ref 4.0–10.5)
nRBC: 0 % (ref 0.0–0.2)

## 2021-05-05 LAB — COMPREHENSIVE METABOLIC PANEL
ALT: 45 U/L — ABNORMAL HIGH (ref 0–44)
AST: 38 U/L (ref 15–41)
Albumin: 4.4 g/dL (ref 3.5–5.0)
Alkaline Phosphatase: 87 U/L (ref 38–126)
Anion gap: 11 (ref 5–15)
BUN: 12 mg/dL (ref 6–20)
CO2: 23 mmol/L (ref 22–32)
Calcium: 9.4 mg/dL (ref 8.9–10.3)
Chloride: 101 mmol/L (ref 98–111)
Creatinine, Ser: 1.14 mg/dL — ABNORMAL HIGH (ref 0.44–1.00)
GFR, Estimated: 56 mL/min — ABNORMAL LOW (ref >60)
Glucose, Bld: 105 mg/dL — ABNORMAL HIGH (ref 70–99)
Potassium: 3.6 mmol/L (ref 3.5–5.1)
Sodium: 135 mmol/L (ref 135–145)
Total Bilirubin: 0.7 mg/dL (ref 0.3–1.2)
Total Protein: 8 g/dL (ref 6.5–8.1)

## 2021-05-05 MED ORDER — ONDANSETRON 8 MG PO TBDP
8.0000 mg | ORAL_TABLET | Freq: Once | ORAL | Status: AC
  Administered 2021-05-05: 8 mg via ORAL

## 2021-05-05 MED ORDER — ALBUTEROL SULFATE HFA 108 (90 BASE) MCG/ACT IN AERS: 2.0000 | INHALATION_SPRAY | Freq: Four times a day (QID) | RESPIRATORY_TRACT | 2 refills | Status: DC | PRN

## 2021-05-05 MED ORDER — PROMETHAZINE HCL 25 MG RE SUPP: 25.0000 mg | Freq: Four times a day (QID) | RECTAL | 0 refills | Status: DC | PRN

## 2021-05-05 MED ORDER — PROMETHAZINE HCL 25 MG/ML IJ SOLN
25.0000 mg | Freq: Once | INTRAMUSCULAR | Status: AC
  Administered 2021-05-05: 25 mg via INTRAMUSCULAR

## 2021-05-05 MED ORDER — PREDNISONE 50 MG PO TABS: ORAL_TABLET | ORAL | 0 refills | Status: DC

## 2021-05-05 NOTE — Telephone Encounter (Signed)
FYI

## 2021-05-05 NOTE — Progress Notes (Signed)
Based on what you shared with me, I feel your condition warrants further evaluation and I recommend that you be seen in a face to face visit.  Being that you have been treated and failed treatment we highly recommend to be seen in person for further evaluation and imaging to rule out pneumonia.   NOTE: There will be NO CHARGE for this eVisit   If you are having a true medical emergency please call 911.      For an urgent face to face visit, [place] has six urgent care centers for your convenience:     [facility] [place] [facility] [place] Get Driving Directions [phone] [place], [place] [address]    [facility]) Get Driving Directions [phone] [place], [place] [address]  [facility] [place] [facility] Get Driving Directions [phone] [address] [place],  [place]  [address]  [facility]  Get Driving Directions [phone] [place], [place], [place] [address]   [facility] [place] Get Driving Directions  [phone] [address] [place], [place] [address]   [facility] [place] Get Driving Directions [phone] [address], [place], [place] [address]  Your MyChart E-visit questionnaire answers were reviewed by a board certified advanced clinical practitioner to complete your personal care plan based on your specific symptoms.  Thank you for using e-Visits.   I provided 5 minutes of non face-to-face time during this encounter for chart review and documentation.

## 2021-05-05 NOTE — Telephone Encounter (Signed)
Pt. Reports she has been having vomiting and diarrhea x 3 days. Does keep "some liquids down. I'm not dehydrated." No availability in the practice. Will do My Chart e-visit.    Reason for Disposition  [1] MILD or MODERATE vomiting AND [2] present > 48 hours (2 days) (Exception: mild vomiting with associated diarrhea)  Answer Assessment - Initial Assessment Questions 1. VOMITING SEVERITY: "How many times have you vomited in the past 24 hours?"     - MILD:  1 - 2 times/day    - MODERATE: 3 - 5 times/day, decreased oral intake without significant weight loss or symptoms of dehydration    - SEVERE: 6 or more times/day, vomits everything or nearly everything, with significant weight loss, symptoms of dehydration      Moderate 2. ONSET: "When did the vomiting begin?"      3 days ago 3. FLUIDS: "What fluids or food have you vomited up today?" "Have you been able to keep any fluids down?"     Yes 4. ABDOMINAL PAIN: "Are your having any abdominal pain?" If yes : "How bad is it and what does it feel like?" (e.g., crampy, dull, intermittent, constant)      No 5. DIARRHEA: "Is there any diarrhea?" If Yes, ask: "How many times today?"      3-4 6. CONTACTS: "Is there anyone else in the family with the same symptoms?"      No 7. CAUSE: "What do you think is causing your vomiting?"     Unsure 8. HYDRATION STATUS: "Any signs of dehydration?" (e.g., dry mouth [not only dry lips], too weak to stand) "When did you last urinate?"     No 9. OTHER SYMPTOMS: "Do you have any other symptoms?" (e.g., fever, headache, vertigo, vomiting blood or coffee grounds, recent head injury)     Fatigue 10. PREGNANCY: "Is there any chance you are pregnant?" "When was your last menstrual period?"       No  Protocols used: Vomiting-A-AH

## 2021-05-05 NOTE — Discharge Instructions (Addendum)
Use pepto for the diarrhea  Go to the ER if your shortness of breath gets worse  and pulse ox drops to 92% or less and your covid test is neg Monitor your pulse ox daily

## 2021-05-05 NOTE — ED Provider Notes (Addendum)
[facility]    CSN: [id number] Arrival date & time: 05/05/21  1411      History   Chief Complaint Chief Complaint  Patient presents with   Cough    HPI Elizabeth Bowers is a 55 y.o. female who presents with hx of cough, fever, N/V x 5 days body aches and was placed on antibiotics per e-visit. She finished the Augmentin 5 days ago. She developed N/V/D of 4 each after she eats and been feeling light headed with changes in position. She has been drinking fluids and smoothies, but goes right though her. She had a temp of 100.1 yesterday. She did a covid test 4 days ago and was negative. She denies blood in her stools or abdominal cramps. She gets SOB with minimal walking at home. Denies long car rides or flights in the past month.  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  Tried to have an E-visit today and was told to be seen since she may have pneumonia.      Past Medical History:  Diagnosis Date   Fatty liver    Fibromyalgia    GERD (gastroesophageal reflux disease)    Hemorrhoids    Hot flashes    Hypertension    IBS (irritable bowel syndrome)    LUQ abdominal pain 10/25/2016   Migraines    Miscarriage    x 2; 4 live births    Multilevel degenerative disc disease    Neuroma    Vocal cord dysfunction     Patient Active Problem List   Diagnosis Date Noted   Morton's neuroma of foot (Left) 11/15/2020   MDD (major depressive disorder) 10/01/2020   Anterior tibialis tendinitis, right 04/28/2020   Insomnia 07/07/2019   Benign essential hypertension 04/22/2019   Radial styloid tenosynovitis of left hand 01/13/2019   Pain in right hand 01/13/2019   Bilateral carpal tunnel syndrome 01/13/2019   Chronic pain of left wrist 01/13/2019   Chronic low back pain (Bilateral) w/o sciatica 08/08/2018   Abnormal MRI, cervical spine (07/10/2018) 07/17/2018   Cervical spondylitis with radiculitis (HCC) (C6) (Bilateral) (R>L)  06/26/2018   Chronic sacroiliac joint pain (Bilateral) (R>L) 06/26/2018   Cervical Grade 1 (2 mm) Retrolisthesis C5 over C6 03/20/2018   Cervical foraminal stenosis (C3-4 & C5-6) (Bilateral) 03/20/2018   DDD (degenerative disc disease), cervical 03/20/2018   Long term prescription benzodiazepine use 03/20/2018   Stage 3 chronic kidney disease ([place]) 03/20/2018   Acid reflux disease 03/20/2018   Spondylosis without myelopathy or radiculopathy, cervical region 03/20/2018   Chronic hip pain (Bilateral) (L>R) 03/20/2018   Lumbar facet syndrome (Bilateral) (R>L) 03/20/2018   Spondylosis without myelopathy or radiculopathy, lumbar region 03/20/2018   Chronic Sacroiliac joint dysfunction (Bilateral) (R>L) 03/20/2018   Chronic Somatic dysfunction of sacroiliac joint (Bilateral) (R>L) 03/20/2018   Osteoarthritis of hip (Bilateral) 03/20/2018   Cervicogenic headache (Bilateral) 03/20/2018   Cervicalgia (Bilateral) (R>L) 03/20/2018   Cervical facet syndrome (Bilateral) (R>L) 03/20/2018   Chronic occipital neuralgia (Bilateral) 03/20/2018   Chronic upper extremity pain (Fourth Area of Pain) (Bilateral) (R>L) 03/19/2018   Ganglion cyst 02/20/2018   Chronic low back pain (2ry area of Pain) (Bilateral) (L>R) w/ sciatica (Bilateral) 01/16/2018   Chronic lower extremity pain (3ry area of Pain) (Bilateral) (L>R) 01/16/2018   Chronic neck pain (1ry area of Pain) (Bilateral) (midline) 01/16/2018   Chronic foot pain (Left) 01/16/2018   Chronic pain syndrome 01/16/2018  Pharmacologic therapy 01/16/2018   Disorder of skeletal system 01/16/2018   Problems influencing health status 01/16/2018   Obesity 11/21/2017   Anxiety 09/28/2017   Chronic knee pain (Right) 09/28/2017   Fatty liver 07/09/2017   Colon polyps 07/09/2017   Fibromyalgia 07/09/2017   Sinusitis, chronic 07/09/2017   Allergic rhinitis 07/09/2017   IBS (irritable bowel syndrome) 07/09/2017   Eyelid dermatitis, allergic/contact 07/09/2017    Superficial thrombophlebitis 01/12/2017   Chronic sacroiliac joint pain (Right) 12/16/2016   DDD (degenerative disc disease), lumbar 07/18/2016   Family hx of colon cancer 07/18/2016    Past Surgical History:  Procedure Laterality Date   ABDOMINAL HYSTERECTOMY     2012 removed cervix h/o abnormal pap    CHOLECYSTECTOMY     2000   COLONOSCOPY     2018 with + polpys and GIB 2/2 polyp removal    COLONOSCOPY WITH PROPOFOL N/A 12/22/2019   Procedure: COLONOSCOPY WITH PROPOFOL;  Surgeon: Lin Landsman, MD;  Location: [facility] END[facility]COPY;  Service: Gastroenterology;  Laterality: N/A;   COLONOSCOPY WITH PROPOFOL N/A 12/23/2019   Procedure: COLONOSCOPY WITH PROPOFOL;  Surgeon: Lin Landsman, MD;  Location: [facility] ENDOSCOPY;  Service: Gastroenterology;  Laterality: N/A;  Last name pronounced LEE-MA   ELBOW DEBRIDEMENT     ESOPHAGOGASTRODUODENOSCOPY (EGD) WITH PROPOFOL N/A 02/06/2018   Procedure: ESOPHAGOGASTRODUODENOSCOPY (EGD) WITH PROPOFOL with biopsies;  Surgeon: Lin Landsman, MD;  Location: [facility];  Service: Endoscopy;  Laterality: N/A;   FOOT SURGERY      OB History   No obstetric history on file.      Home Medications    Prior to Admission medications   Medication Sig Start Date End Date Taking? Authorizing Provider  albuterol (VENTOLIN HFA) 108 (90 Base) MCG/ACT inhaler Inhale 2 puffs into the lungs every 6 (six) hours as needed for wheezing or shortness of breath. 05/05/21  Yes Rodriguez-Southworth, Sunday Spillers, PA-C  amLODipine (NORVASC) 2.5 MG tablet TAKE 1 TABLET BY MOUTH DAILY. 03/16/21 03/16/22 Yes Bacigalupo, [name], MD  buPROPion (WELLBUTRIN XL) 300 MG 24 hr tablet Take 1 tablet (300 mg total) by mouth daily. 04/27/21  Yes Bacigalupo, [name], MD  DULoxetine (CYMBALTA) 60 MG capsule TAKE 1 CAPSULE BY MOUTH DAILY. 11/23/20 11/23/21 Yes Bacigalupo, [name], MD  lansoprazole (PREVACID) 30 MG capsule TAKE 1 CAPSULE BY MOUTH TWICE DAILY BEFORE A MEAL. 02/16/21  02/16/22 Yes Bacigalupo, [name], MD  LINZESS 290 MCG CAPS capsule TAKE 1 CAPSULE BY MOUTH DAILY. 04/27/21 04/27/22 Yes Bacigalupo, [name], MD  Menaquinone-7 (VITAMIN K2 PO) Take by mouth.   Yes [provider]  Multiple Vitamin (MULTIVITAMIN WITH MINERALS) TABS tablet Take 1 tablet by mouth daily.   Yes [provider]  predniSONE (DELTASONE) 50 MG tablet One qd 05/05/21  Yes Rodriguez-Southworth, Sunday Spillers, PA-C  promethazine (PHENERGAN) 25 MG suppository Place 1 suppository (25 mg total) rectally every 6 (six) hours as needed for nausea or vomiting. 05/05/21  Yes Rodriguez-Southworth, Sunday Spillers, PA-C  psyllium (METAMUCIL) 58.6 % powder Take 2 packets by mouth daily.   Yes [provider]  triamterene-hydrochlorothiazide (MAXZIDE-25) 37.5-25 MG tablet TAKE 1 TABLET BY MOUTH DAILY. 12/13/20 12/13/21 Yes Bacigalupo, [name], MD  vitamin C (ASCORBIC ACID) 500 MG tablet Take 500 mg by mouth daily.   Yes [provider]    Family History Family History  Problem Relation Age of Onset   Hypertension Mother    Hyperlipidemia Mother    Glaucoma Mother    Diabetes Mother  type 2    COPD Father        emphysema   Alcohol abuse Father    Colon cancer Sister    Cancer Sister        rectal/colon cancer no h/o IBD   Colon polyps Sister    Asthma Sister    COPD Sister    Kidney disease Sister    Mental illness Sister        bipolar    Miscarriages / [place] Sister    Uterine cancer Sister    Alcohol abuse Son    Depression Son    Diabetes Son    Drug abuse Son    Mental illness Son        bipolar    Diabetes Maternal Grandmother    COPD Paternal Grandmother    Cancer Paternal Grandfather        FH colon cancer maternal aunts/uncles    Miscarriages / Stillbirths Sister    Arthritis Son    Arthritis Son    Breast cancer Neg Hx     Social History Social History   Tobacco Use   Smoking status: Former    Packs/day: 0.75    Years: 5.00     Pack years: 3.75    Types: Cigarettes    Quit date: 07/06/1987    Years since quitting: 33.8   Smokeless tobacco: Never  Vaping Use   Vaping Use: Never used  Substance Use Topics   Alcohol use: Yes    Alcohol/week: 2.0 - 4.0 standard drinks    Types: 2 - 4 Cans of beer per week    Comment: wine cooler   Drug use: Never     Allergies   Gluten meal, Lactose, Ranitidine, Amphetamine-dextroamphetamine, Lactose intolerance (gi), Reglan [metoclopramide], Topiramate, Topiramate er, Wheat bran, Soap, and Sulfa antibiotics   Review of Systems Review of Systems  Constitutional:  Positive for activity change, appetite change, chills and fatigue. Negative for diaphoresis and fever.       Is able to smell and taste   HENT:  Positive for postnasal drip. Negative for congestion and sore throat.   Eyes:  Negative for discharge.  Respiratory:  Positive for cough and shortness of breath. Negative for chest tightness and wheezing.   Cardiovascular:  Negative for chest pain and leg swelling.  Gastrointestinal:  Positive for diarrhea, nausea and vomiting. Negative for abdominal pain and blood in stool.  Musculoskeletal:  Negative for myalgias.  Skin:  Negative for rash.  Neurological:  Positive for light-headedness. Negative for headaches.  Hematological:  Negative for adenopathy.    Physical Exam Triage Vital Signs ED Triage Vitals  Enc Vitals Group     BP 05/05/21 1433 135/84     Pulse Rate 05/05/21 1433 (!) 101     Resp 05/05/21 1433 18     Temp 05/05/21 1433 99 F (37.2 C)     Temp Source 05/05/21 1433 Oral     SpO2 05/05/21 1433 95 %     Weight 05/05/21 1429 175 lb 14.8 oz (79.8 kg)     Height 05/05/21 1429 5\' 2"  (1.575 m)     Head Circumference --      Peak Flow --      Pain Score 05/05/21 1429 3     Pain Loc --      Pain Edu? --      Excl. in GC? --    Orthostatic VS for the past 24 hrs:  BP- Lying Pulse-  Lying BP- Sitting Pulse- Sitting BP- Standing at 0 minutes Pulse-  Standing at 0 minutes  05/05/21 1533 123/86 79 113/88 98 114/76 99    Updated Vital Signs BP 135/84 (BP Location: Left Arm)   Pulse (!) 101   Temp 99 F (37.2 C) (Oral)   Resp 18   Ht 5\' 2"  (1.575 m)   Wt 175 lb 14.8 oz (79.8 kg)   SpO2 95%   BMI 32.18 kg/m   Visual Acuity Right Eye Distance:   Left Eye Distance:   Bilateral Distance:    Right Eye Near:   Left Eye Near:    Bilateral Near:      Physical Exam Vitals signs and nursing note reviewed.  Constitutional:      General: She is not in acute distress.    Appearance: Normal appearance. She is  ill-appearing, but not toxic-appearing or diaphoretic.  HENT:     Head: Normocephalic.     Right Ear: Tympanic membrane, ear canal and external ear normal.     Left Ear: Tympanic membrane, ear canal and external ear normal.     Nose: Nose normal.     Mouth/Throat: a little dry    Mouth: Mucous membranes are moist.  Eyes:     General: No scleral icterus.       Right eye: No discharge.        Left eye: No discharge.     Conjunctiva/sclera: Conjunctivae normal.  Neck:     Musculoskeletal: Neck supple. No neck rigidity.  Cardiovascular:     Rate and Rhythm: Normal rate and regular rhythm.     Heart sounds: No murmur.  Pulmonary:     Effort: Pulmonary effort is normal.     Breath sounds: Normal breath sounds.  .  Musculoskeletal: Normal range of motion. No edema noted.  Lymphadenopathy:     Cervical: No cervical adenopathy.  Skin:    General: Skin is warm and dry.     Coloration: Skin is not jaundiced.     Findings: No rash.  Neurological:     Mental Status: She is alert and oriented to person, place, and time.     Gait: Gait normal.  Psychiatric:        Mood and Affect: Mood normal.        Behavior: Behavior normal.        Thought Content: Thought content normal.        Judgment: Judgment normal.    UC Treatments / Results  Labs (all labs ordered are listed, but only abnormal results are displayed) Labs  Reviewed  CBC WITH DIFFERENTIAL/PLATELET - Abnormal; Notable for the following components:      Result Value   RBC 5.27 (*)    Hemoglobin 16.2 (*)    HCT 46.8 (*)    All other components within normal limits  COMPREHENSIVE METABOLIC PANEL - Abnormal; Notable for the following components:   Glucose, Bld 105 (*)    Creatinine, Ser 1.14 (*)    ALT 45 (*)    GFR, Estimated 56 (*)    All other components within normal limits  GASTROINTESTINAL PANEL BY PCR, STOOL (REPLACES STOOL CULTURE)  SARS CORONAVIRUS 2 (TAT 6-24 HRS)    EKG Normal sinus rhythm with prolonged QT. I could not find an old EKG for comparison   Radiology DG Chest 2 View  Result Date: 05/05/2021 CLINICAL DATA:  Shortness of breath, cough. EXAM: CHEST - 2 VIEW COMPARISON:  None. FINDINGS: The heart  size and mediastinal contours are within normal limits. Both lungs are clear. The visualized skeletal structures are unremarkable. IMPRESSION: No active cardiopulmonary disease. Electronically Signed   By: Marijo Conception M.D.   On: 05/05/2021 15:16    Procedures Procedures (including critical care time)  Medications Ordered in UC Medications  ondansetron (ZOFRAN-ODT) disintegrating tablet 8 mg (8 mg Oral Given 05/05/21 1546)  promethazine (PHENERGAN) injection 25 mg (25 mg Intramuscular Given 05/05/21 1622)    Initial Impression / Assessment and Plan / UC Course  I have reviewed the triage vital signs and the nursing notes. Pertinent labs & imaging results that were available during my care of the patient were reviewed by me and considered in my medical decision making (see chart for details). Has gastroenteritis, r/o C- diff or other pathogens. SOB of unknown cause, she may have covid.  She was given Zofran ODT 8 mg and she had dry heaves after this.  She was given Phenergan 25 mg IM, and this helped her nausea and was able to take sips of water without vomiting. Covid test was ordered I sent Phenergan supositories as  noted, and needs to continue hydrating.  I also order stool studies and she will bring it to out patient lab when collected.  I placed her on Phenergan, Prednisone and Albuterol as noted.  She called her husband to drive her home.  See instructions.     Final Clinical Impressions(s) / UC Diagnoses   Final diagnoses:  Other noninfectious gastroenteritis  SOB (shortness of breath)  Viral URI with cough  Dehydration     Discharge Instructions      Use pepto for the diarrhea  Go to the ER if your shortness of breath gets worse  and pulse ox drops to 92% or less and your covid test is neg Monitor your pulse ox daily     ED Prescriptions     Medication Sig Dispense Auth. Provider   promethazine (PHENERGAN) 25 MG suppository Place 1 suppository (25 mg total) rectally every 6 (six) hours as needed for nausea or vomiting. 20 each Rodriguez-Southworth, Sunday Spillers, PA-C   albuterol (VENTOLIN HFA) 108 (90 Base) MCG/ACT inhaler Inhale 2 puffs into the lungs every 6 (six) hours as needed for wheezing or shortness of breath. 8 g Rodriguez-Southworth, Sunday Spillers, PA-C   predniSONE (DELTASONE) 50 MG tablet One qd 5 tablet Rodriguez-Southworth, Sunday Spillers, PA-C      PDMP not reviewed this encounter.   Rodriguez-Southworth, Sunday Spillers, Hershal Coria 05/05/21 2029    Rodriguez-Southworth, Sunday Spillers, PA-C 05/05/21 2030

## 2021-05-05 NOTE — Telephone Encounter (Signed)
Out patient Stool studies placed

## 2021-05-05 NOTE — ED Triage Notes (Signed)
Pt c/o cough, shortness of breath, body aches, fever, nausea, vomiting. Started about 5 days ago. She states she was seen and given an antibiotic via e visit and states that when she was finishing up the medication is when her symptoms started. She did another e visit today and she was told to be seen in person for possible pneumonia.

## 2021-05-05 NOTE — Telephone Encounter (Signed)
Noted  

## 2021-05-06 ENCOUNTER — Telehealth: Payer: Self-pay | Admitting: Emergency Medicine

## 2021-05-06 LAB — GASTROINTESTINAL PANEL BY PCR, STOOL (REPLACES STOOL CULTURE)

## 2021-05-06 LAB — C DIFFICILE QUICK SCREEN W PCR REFLEX
C Diff antigen: NEGATIVE
C Diff interpretation: NOT DETECTED
C Diff toxin: NEGATIVE

## 2021-05-06 LAB — SARS CORONAVIRUS 2 (TAT 6-24 HRS): SARS Coronavirus 2: NEGATIVE

## 2021-05-11 ENCOUNTER — Other Ambulatory Visit: Payer: Self-pay | Admitting: Family Medicine

## 2021-05-11 DIAGNOSIS — K219 Gastro-esophageal reflux disease without esophagitis: Secondary | ICD-10-CM

## 2021-05-11 MED ORDER — LANSOPRAZOLE 30 MG PO CPDR
DELAYED_RELEASE_CAPSULE | ORAL | 0 refills | Status: DC
  Filled 2021-05-11: qty 180, 90d supply, fill #0

## 2021-05-11 NOTE — Telephone Encounter (Signed)
Requested Prescriptions  Pending Prescriptions Disp Refills  . lansoprazole (PREVACID) 30 MG capsule 180 capsule 0    Sig: TAKE 1 CAPSULE BY MOUTH TWICE DAILY BEFORE A MEAL.     Gastroenterology: Proton Pump Inhibitors Passed - 05/11/2021  9:50 PM      Passed - Valid encounter within last 12 months    Recent Outpatient Visits          1 month ago Benign essential hypertension   [place], [name], MD   3 months ago Acute non-recurrent maxillary sinusitis   [facility] [place], [name], MD   7 months ago Essential hypertension   [facility] [place], [name], MD   11 months ago Encounter for annual physical exam   [facility] [place], [name], MD   11 months ago Hypertension, unspecified type   [place], Vickki Muff, PA-C      Future Appointments            In 4 months Bacigalupo, [name], MD [facility], [place]

## 2021-05-12 ENCOUNTER — Other Ambulatory Visit (HOSPITAL_COMMUNITY): Payer: Self-pay

## 2021-05-20 ENCOUNTER — Other Ambulatory Visit (HOSPITAL_COMMUNITY): Payer: Self-pay

## 2021-05-20 ENCOUNTER — Other Ambulatory Visit: Payer: Self-pay | Admitting: Family Medicine

## 2021-05-21 ENCOUNTER — Other Ambulatory Visit (HOSPITAL_COMMUNITY): Payer: Self-pay

## 2021-05-21 MED ORDER — DULOXETINE HCL 60 MG PO CPEP
ORAL_CAPSULE | Freq: Every day | ORAL | 1 refills | Status: DC
  Filled 2021-05-21: qty 90, 90d supply, fill #0
  Filled 2021-08-17: qty 90, 90d supply, fill #1

## 2021-05-21 NOTE — Telephone Encounter (Signed)
Requested Prescriptions  Pending Prescriptions Disp Refills   DULoxetine (CYMBALTA) 60 MG capsule 90 capsule 1    Sig: TAKE 1 CAPSULE BY MOUTH DAILY.     Psychiatry: Antidepressants - SNRI Passed - 05/20/2021  9:39 AM      Passed - Completed PHQ-2 or PHQ-9 in the last 360 days      Passed - Last BP in normal range    BP Readings from Last 1 Encounters:  05/05/21 135/84         Passed - Valid encounter within last 6 months    Recent Outpatient Visits          1 month ago Benign essential hypertension   [facility] [place], [name], MD   3 months ago Acute non-recurrent maxillary sinusitis   [facility] [place], [name], MD   7 months ago Essential hypertension   [facility] [place], [name], MD   11 months ago Encounter for annual physical exam   [facility] [place], [name], MD   1 year ago Hypertension, unspecified type   [place], Vickki Muff, PA-C      Future Appointments            In 4 months Bacigalupo, [name], MD [facility], [place]

## 2021-05-23 ENCOUNTER — Other Ambulatory Visit (HOSPITAL_COMMUNITY): Payer: Self-pay

## 2021-06-08 ENCOUNTER — Other Ambulatory Visit: Payer: Self-pay | Admitting: Family Medicine

## 2021-06-09 ENCOUNTER — Other Ambulatory Visit (HOSPITAL_COMMUNITY): Payer: Self-pay

## 2021-06-09 MED ORDER — AMLODIPINE BESYLATE 2.5 MG PO TABS
2.5000 mg | ORAL_TABLET | Freq: Every day | ORAL | 2 refills | Status: DC
  Filled 2021-06-09: qty 30, 30d supply, fill #0
  Filled 2021-07-14: qty 30, 30d supply, fill #1
  Filled 2021-09-03: qty 30, 30d supply, fill #2

## 2021-06-09 MED ORDER — TRIAMTERENE-HCTZ 37.5-25 MG PO TABS
1.0000 | ORAL_TABLET | Freq: Every day | ORAL | 1 refills | Status: DC
  Filled 2021-06-09: qty 90, 90d supply, fill #0
  Filled 2021-09-03: qty 90, 90d supply, fill #1

## 2021-06-22 ENCOUNTER — Ambulatory Visit
Admission: RE | Admit: 2021-06-22 | Discharge: 2021-06-22 | Disposition: A | Discharge status: Home / Self Care | Payer: 59 | Source: Ambulatory Visit | Attending: Medical Oncology | Admitting: Medical Oncology

## 2021-06-22 ENCOUNTER — Other Ambulatory Visit: Payer: Self-pay

## 2021-06-22 VITALS — BP 147/87 | HR 93 | Temp 99.0°F | Resp 18

## 2021-06-22 DIAGNOSIS — J0100 Acute maxillary sinusitis, unspecified: Secondary | ICD-10-CM

## 2021-06-22 MED ORDER — AMOXICILLIN-POT CLAVULANATE 875-125 MG PO TABS: 1.0000 | ORAL_TABLET | Freq: Two times a day (BID) | ORAL | 0 refills | Status: DC

## 2021-06-22 MED ORDER — FLUTICASONE PROPIONATE 50 MCG/ACT NA SUSP: 2.0000 | Freq: Every day | NASAL | 0 refills | Status: DC

## 2021-06-22 NOTE — ED Triage Notes (Signed)
Pt presents with cough, sinus pressure/pain, and green mucus x 1 week.

## 2021-06-22 NOTE — ED Provider Notes (Signed)
[name]    CSN: [id number] Arrival date & time: 06/22/21  1053      History   Chief Complaint Chief Complaint  Patient presents with   Sinus Pressure   Cough   Nasal Congestion    HPI Elizabeth Bowers is a 56 y.o. female.   HPI  Cold Symptoms: Patient reports that they have had symptoms of sinus pressure, green sinus mucous, dry cough for the past few weeks but worsening over the past 7 days. They deny SOB, chest pain, fever or vomiting. They have tried sinus rinse, benadryl for symptoms. No known sick contacts.    Past Medical History:  Diagnosis Date   Fatty liver    Fibromyalgia    GERD (gastroesophageal reflux disease)    Hemorrhoids    Hot flashes    Hypertension    IBS (irritable bowel syndrome)    LUQ abdominal pain 10/25/2016   Migraines    Miscarriage    x 2; 4 live births    Multilevel degenerative disc disease    Neuroma    Vocal cord dysfunction     Patient Active Problem List   Diagnosis Date Noted   Morton's neuroma of foot (Left) 11/15/2020   MDD (major depressive disorder) 10/01/2020   Anterior tibialis tendinitis, right 04/28/2020   Insomnia 07/07/2019   Benign essential hypertension 04/22/2019   Radial styloid tenosynovitis of left hand 01/13/2019   Pain in right hand 01/13/2019   Bilateral carpal tunnel syndrome 01/13/2019   Chronic pain of left wrist 01/13/2019   Chronic low back pain (Bilateral) w/o sciatica 08/08/2018   Abnormal MRI, cervical spine (07/10/2018) 07/17/2018   Cervical spondylitis with radiculitis (HCC) (C6) (Bilateral) (R>L) 06/26/2018   Chronic sacroiliac joint pain (Bilateral) (R>L) 06/26/2018   Cervical Grade 1 (2 mm) Retrolisthesis C5 over C6 03/20/2018   Cervical foraminal stenosis (C3-4 & C5-6) (Bilateral) 03/20/2018   DDD (degenerative disc disease), cervical 03/20/2018   Long term prescription benzodiazepine use 03/20/2018   Stage 3 chronic kidney disease ([place]) 03/20/2018   Acid reflux disease  03/20/2018   Spondylosis without myelopathy or radiculopathy, cervical region 03/20/2018   Chronic hip pain (Bilateral) (L>R) 03/20/2018   Lumbar facet syndrome (Bilateral) (R>L) 03/20/2018   Spondylosis without myelopathy or radiculopathy, lumbar region 03/20/2018   Chronic Sacroiliac joint dysfunction (Bilateral) (R>L) 03/20/2018   Chronic Somatic dysfunction of sacroiliac joint (Bilateral) (R>L) 03/20/2018   Osteoarthritis of hip (Bilateral) 03/20/2018   Cervicogenic headache (Bilateral) 03/20/2018   Cervicalgia (Bilateral) (R>L) 03/20/2018   Cervical facet syndrome (Bilateral) (R>L) 03/20/2018   Chronic occipital neuralgia (Bilateral) 03/20/2018   Chronic upper extremity pain (Fourth Area of Pain) (Bilateral) (R>L) 03/19/2018   Ganglion cyst 02/20/2018   Chronic low back pain (2ry area of Pain) (Bilateral) (L>R) w/ sciatica (Bilateral) 01/16/2018   Chronic lower extremity pain (3ry area of Pain) (Bilateral) (L>R) 01/16/2018   Chronic neck pain (1ry area of Pain) (Bilateral) (midline) 01/16/2018   Chronic foot pain (Left) 01/16/2018   Chronic pain syndrome 01/16/2018   Pharmacologic therapy 01/16/2018   Disorder of skeletal system 01/16/2018   Problems influencing health status 01/16/2018   Obesity 11/21/2017   Anxiety 09/28/2017   Chronic knee pain (Right) 09/28/2017   Fatty liver 07/09/2017   Colon polyps 07/09/2017   Fibromyalgia 07/09/2017   Sinusitis, chronic 07/09/2017   Allergic rhinitis 07/09/2017   IBS (irritable bowel syndrome) 07/09/2017   Eyelid dermatitis, allergic/contact 07/09/2017   Superficial thrombophlebitis 01/12/2017   Chronic sacroiliac  joint pain (Right) 12/16/2016   DDD (degenerative disc disease), lumbar 07/18/2016   Family hx of colon cancer 07/18/2016    Past Surgical History:  Procedure Laterality Date   ABDOMINAL HYSTERECTOMY     2012 removed cervix h/o abnormal pap    CHOLECYSTECTOMY     2000   COLONOSCOPY     2018 with + polpys and GIB  2/2 polyp removal    COLONOSCOPY WITH PROPOFOL N/A 12/22/2019   Procedure: COLONOSCOPY WITH PROPOFOL;  Surgeon: Lin Landsman, MD;  Location: [place];  Service: Gastroenterology;  Laterality: N/A;   COLONOSCOPY WITH PROPOFOL N/A 12/23/2019   Procedure: COLONOSCOPY WITH PROPOFOL;  Surgeon: Lin Landsman, MD;  Location[facility] ENDOSCOPY;  Service: Gastroenterology;  Laterality: N/A;  Last name pronounced LEE-MA   ELBOW DEBRIDEMENT     ESOPHAGOGASTRODUODENOSCOPY (EGD) WITH PROPOFOL N/A 02/06/2018   Procedure: ESOPHAGOGASTRODUODENOSCOPY (EGD) WITH PROPOFOL with biopsies;  Surgeon: Lin Landsman, MD;  Location: [place];  Service: Endoscopy;  Laterality: N/A;   FOOT SURGERY      OB History   No obstetric history on file.      Home Medications    Prior to Admission medications   Medication Sig Start Date End Date Taking? Authorizing Provider  albuterol (VENTOLIN HFA) 108 (90 Base) MCG/ACT inhaler Inhale 2 puffs into the lungs every 6 (six) hours as needed for wheezing or shortness of breath. 05/05/21   Rodriguez-Southworth, Sunday Spillers, PA-C  amLODipine (NORVASC) 2.5 MG tablet Take 1 tablet (2.5 mg total) by mouth daily. 06/09/21 09/07/21  Virginia Crews, MD  buPROPion (WELLBUTRIN XL) 300 MG 24 hr tablet Take 1 tablet (300 mg total) by mouth daily. 04/27/21   Virginia Crews, MD  DULoxetine (CYMBALTA) 60 MG capsule TAKE 1 CAPSULE BY MOUTH DAILY. 05/21/21 05/21/22  Virginia Crews, MD  lansoprazole (PREVACID) 30 MG capsule TAKE 1 CAPSULE BY MOUTH TWICE DAILY BEFORE A MEAL. 05/11/21 05/11/22  Bacigalupo, [name], MD  LINZESS 290 MCG CAPS capsule TAKE 1 CAPSULE BY MOUTH DAILY. 04/27/21 04/27/22  Virginia Crews, MD  Menaquinone-7 (VITAMIN K2 PO) Take by mouth.    [provider]  Multiple Vitamin (MULTIVITAMIN WITH MINERALS) TABS tablet Take 1 tablet by mouth daily.    [provider]  predniSONE (DELTASONE) 50 MG tablet One qd 05/05/21    Rodriguez-Southworth, Sunday Spillers, PA-C  promethazine (PHENERGAN) 25 MG suppository Place 1 suppository (25 mg total) rectally every 6 (six) hours as needed for nausea or vomiting. 05/05/21   Rodriguez-Southworth, Sunday Spillers, PA-C  psyllium (METAMUCIL) 58.6 % powder Take 2 packets by mouth daily.    [provider]  triamterene-hydrochlorothiazide (MAXZIDE-25) 37.5-25 MG tablet TAKE 1 TABLET BY MOUTH DAILY. 06/09/21 06/09/22  Virginia Crews, MD  vitamin C (ASCORBIC ACID) 500 MG tablet Take 500 mg by mouth daily.    [provider]    Family History Family History  Problem Relation Age of Onset   Hypertension Mother    Hyperlipidemia Mother    Glaucoma Mother    Diabetes Mother        type 2    COPD Father        emphysema   Alcohol abuse Father    Colon cancer Sister    Cancer Sister        rectal/colon cancer no h/o IBD   Colon polyps Sister    Asthma Sister    COPD Sister    Kidney disease Sister    Mental  illness Sister        bipolar    Miscarriages / [place] Sister    Uterine cancer Sister    Alcohol abuse Son    Depression Son    Diabetes Son    Drug abuse Son    Mental illness Son        bipolar    Diabetes Maternal Grandmother    COPD Paternal Grandmother    Cancer Paternal Grandfather        FH colon cancer maternal aunts/uncles    Miscarriages / Stillbirths Sister    Arthritis Son    Arthritis Son    Breast cancer Neg Hx     Social History Social History   Tobacco Use   Smoking status: Former    Packs/day: 0.75    Years: 5.00    Pack years: 3.75    Types: Cigarettes    Quit date: 07/06/1987    Years since quitting: 33.9   Smokeless tobacco: Never  Vaping Use   Vaping Use: Never used  Substance Use Topics   Alcohol use: Yes    Alcohol/week: 2.0 - 4.0 standard drinks    Types: 2 - 4 Cans of beer per week    Comment: wine cooler   Drug use: Never     Allergies   Gluten meal, Lactose, Ranitidine, Amphetamine-dextroamphetamine,  Lactose intolerance (gi), Reglan [metoclopramide], Topiramate, Topiramate er, Wheat bran, Soap, and Sulfa antibiotics   Review of Systems Review of Systems  As stated above in HPI Physical Exam Triage Vital Signs ED Triage Vitals  Enc Vitals Group     BP 06/22/21 1109 (!) 147/87     Pulse Rate 06/22/21 1109 93     Resp 06/22/21 1109 18     Temp 06/22/21 1109 99 F (37.2 C)     Temp Source 06/22/21 1109 Oral     SpO2 06/22/21 1109 97 %     Weight --      Height --      Head Circumference --      Peak Flow --      Pain Score 06/22/21 1111 6     Pain Loc --      Pain Edu? --      Excl. in [place]? --    No data found.  Updated Vital Signs BP (!) 147/87 (BP Location: Left Arm)    Pulse 93    Temp 99 F (37.2 C) (Oral)    Resp 18    SpO2 97%   Physical Exam Vitals and nursing note reviewed.  Constitutional:      General: She is not in acute distress.    Appearance: Normal appearance. She is not ill-appearing, toxic-appearing or diaphoretic.  HENT:     Head: Normocephalic and atraumatic.     Right Ear: Tympanic membrane normal.     Left Ear: Tympanic membrane normal.     Nose: Congestion (Maxillary sinus bogginess) and rhinorrhea present.     Mouth/Throat:     Mouth: Mucous membranes are moist.     Pharynx: Oropharynx is clear. No oropharyngeal exudate or posterior oropharyngeal erythema.  Eyes:     General:        Right eye: No discharge.        Left eye: No discharge.     Extraocular Movements: Extraocular movements intact.     Conjunctiva/sclera: Conjunctivae normal.     Pupils: Pupils are equal, round, and reactive to light.  Cardiovascular:  Rate and Rhythm: Normal rate and regular rhythm.     Heart sounds: Normal heart sounds.  Pulmonary:     Effort: Pulmonary effort is normal. No respiratory distress.     Breath sounds: Normal breath sounds. No stridor. No wheezing or rhonchi.  Abdominal:     Palpations: Abdomen is soft.  Musculoskeletal:     Cervical  back: Normal range of motion and neck supple.  Lymphadenopathy:     Cervical: No cervical adenopathy.  Skin:    General: Skin is warm.     Coloration: Skin is not jaundiced.     Findings: No erythema or rash.  Neurological:     Mental Status: She is alert and oriented to person, place, and time.     UC Treatments / Results  Labs (all labs ordered are listed, but only abnormal results are displayed) Labs Reviewed - No data to display  EKG   Radiology No results found.  Procedures Procedures (including critical care time)  Medications Ordered in UC Medications - No data to display  Initial Impression / Assessment and Plan / UC Course  I have reviewed the triage vital signs and the nursing notes.  Pertinent labs & imaging results that were available during my care of the patient were reviewed by me and considered in my medical decision making (see chart for details).     New. Treating for acute bacterial sinusitis with Augmentin, Flonase and continuation of her sinus rinse. Continue hydration with water and follow up as needed.  Final Clinical Impressions(s) / UC Diagnoses   Final diagnoses:  None   Discharge Instructions   None    ED Prescriptions   None    PDMP not reviewed this encounter.   Hughie Closs, [place] 06/22/21 1126

## 2021-07-01 ENCOUNTER — Other Ambulatory Visit: Payer: Self-pay

## 2021-07-01 ENCOUNTER — Ambulatory Visit
Admission: RE | Admit: 2021-07-01 | Discharge: 2021-07-01 | Disposition: A | Discharge status: Home / Self Care | Payer: 59 | Source: Ambulatory Visit | Attending: Family Medicine | Admitting: Family Medicine

## 2021-07-01 ENCOUNTER — Encounter: Payer: Self-pay | Admitting: Family Medicine

## 2021-07-01 ENCOUNTER — Ambulatory Visit (INDEPENDENT_AMBULATORY_CARE_PROVIDER_SITE_OTHER): Payer: 59 | Admitting: Family Medicine

## 2021-07-01 ENCOUNTER — Ambulatory Visit
Admission: RE | Admit: 2021-07-01 | Discharge: 2021-07-01 | Disposition: A | Discharge status: Home / Self Care | Payer: 59 | Attending: Family Medicine | Admitting: Family Medicine

## 2021-07-01 ENCOUNTER — Telehealth: Payer: Self-pay | Admitting: Family Medicine

## 2021-07-01 VITALS — BP 137/98 | HR 92 | Temp 100.3°F | Resp 16 | Ht 65.0 in

## 2021-07-01 DIAGNOSIS — I700 Atherosclerosis of aorta: Secondary | ICD-10-CM | POA: Diagnosis not present

## 2021-07-01 DIAGNOSIS — R0602 Shortness of breath: Secondary | ICD-10-CM

## 2021-07-01 DIAGNOSIS — R059 Cough, unspecified: Secondary | ICD-10-CM | POA: Diagnosis not present

## 2021-07-01 DIAGNOSIS — R509 Fever, unspecified: Secondary | ICD-10-CM | POA: Insufficient documentation

## 2021-07-01 DIAGNOSIS — R051 Acute cough: Secondary | ICD-10-CM | POA: Diagnosis not present

## 2021-07-01 NOTE — Telephone Encounter (Signed)
Letter send to patient.

## 2021-07-01 NOTE — Telephone Encounter (Signed)
Pt is calling to remind Dr. B to write a letter for her to be out of work until Tuesday. Pt is fine will with this being uploaded into mychart.  [place]- [phone]

## 2021-07-01 NOTE — Progress Notes (Signed)
Established patient visit   Patient: Elizabeth Bowers   DOB: 06-20-1965   56 y.o. Female  MRN: [id number] Visit Date: 07/01/2021  Today's healthcare provider: Lavon Paganini, MD   Chief Complaint  Patient presents with   Fever   Subjective    HPI   Cough Has had fever, taking tylenol Took a home covid rapid test on Wednesday morning which was negative Runny nose, congestion Chest tightness Cough productive with yellow-green sputum Dyspnea - when taking shower, walking around room Pain in throat especially with cough  Medications: Outpatient Medications Prior to Visit  Medication Sig   albuterol (VENTOLIN HFA) 108 (90 Base) MCG/ACT inhaler Inhale 2 puffs into the lungs every 6 (six) hours as needed for wheezing or shortness of breath.   amLODipine (NORVASC) 2.5 MG tablet Take 1 tablet (2.5 mg total) by mouth daily.   buPROPion (WELLBUTRIN XL) 300 MG 24 hr tablet Take 1 tablet (300 mg total) by mouth daily.   DULoxetine (CYMBALTA) 60 MG capsule TAKE 1 CAPSULE BY MOUTH DAILY.   fluticasone (FLONASE) 50 MCG/ACT nasal spray Place 2 sprays into both nostrils daily.   lansoprazole (PREVACID) 30 MG capsule TAKE 1 CAPSULE BY MOUTH TWICE DAILY BEFORE A MEAL.   LINZESS 290 MCG CAPS capsule TAKE 1 CAPSULE BY MOUTH DAILY.   Menaquinone-7 (VITAMIN K2 PO) Take by mouth.   Multiple Vitamin (MULTIVITAMIN WITH MINERALS) TABS tablet Take 1 tablet by mouth daily.   predniSONE (DELTASONE) 50 MG tablet One qd   promethazine (PHENERGAN) 25 MG suppository Place 1 suppository (25 mg total) rectally every 6 (six) hours as needed for nausea or vomiting.   psyllium (METAMUCIL) 58.6 % powder Take 2 packets by mouth daily.   triamterene-hydrochlorothiazide (MAXZIDE-25) 37.5-25 MG tablet TAKE 1 TABLET BY MOUTH DAILY.   vitamin C (ASCORBIC ACID) 500 MG tablet Take 500 mg by mouth daily.   amoxicillin-clavulanate (AUGMENTIN) 875-125 MG tablet Take 1 tablet by mouth every 12 (twelve) hours. (Patient  not taking: Reported on 07/01/2021)   No facility-administered medications prior to visit.    Review of Systems  Constitutional:  Positive for appetite change, chills, fatigue and fever.  HENT:  Positive for congestion, postnasal drip, rhinorrhea, sinus pressure and sore throat.   Eyes: Negative.   Respiratory:  Positive for cough, chest tightness and shortness of breath.   Cardiovascular: Negative.   Gastrointestinal: Negative.   Endocrine: Negative.   Genitourinary: Negative.   Musculoskeletal:  Positive for myalgias.  Skin: Negative.   Neurological:  Positive for headaches.  Psychiatric/Behavioral: Negative.        Objective    BP (!) 137/98 (BP Location: Left Arm, Patient Position: Sitting, Cuff Size: Large)    Pulse 92    Temp 100.3 F (37.9 C) (Oral)    Resp 16    Ht 5\' 5"  (1.651 m)    SpO2 98%    BMI 29.28 kg/m  {Show previous vital signs (optional):23777}  Physical Exam Vitals reviewed.  Constitutional:      General: She is not in acute distress.    Appearance: Normal appearance. She is ill-appearing. She is not toxic-appearing.  HENT:     Head: Normocephalic and atraumatic.     Right Ear: External ear normal.     Left Ear: External ear normal.     Nose: Congestion and rhinorrhea present.     Mouth/Throat:     Mouth: Mucous membranes are moist.     Pharynx: Oropharynx is clear.  No oropharyngeal exudate or posterior oropharyngeal erythema.  Eyes:     General: No scleral icterus.    Extraocular Movements: Extraocular movements intact.     Conjunctiva/sclera: Conjunctivae normal.     Pupils: Pupils are equal, round, and reactive to light.  Cardiovascular:     Rate and Rhythm: Normal rate and regular rhythm.     Pulses: Normal pulses.     Heart sounds: Normal heart sounds. No murmur heard.   No friction rub. No gallop.  Pulmonary:     Effort: Pulmonary effort is normal.     Breath sounds: Rales present.  Abdominal:     General: Abdomen is flat. There is no  distension.     Palpations: Abdomen is soft.     Tenderness: There is no abdominal tenderness.  Musculoskeletal:        General: Normal range of motion.     Cervical back: Normal range of motion and neck supple.     Right lower leg: No edema.     Left lower leg: No edema.  Skin:    General: Skin is warm and dry.     Capillary Refill: Capillary refill takes less than 2 seconds.     Findings: No lesion or rash.  Neurological:     General: No focal deficit present.     Mental Status: She is alert and oriented to person, place, and time. Mental status is at baseline.  Psychiatric:        Mood and Affect: Mood normal.    No results found for any visits on 07/01/21.  Assessment & Plan     Problem List Items Addressed This Visit   None Visit Diagnoses     Acute cough    -  Primary   Onset of symptoms on Tuesday with fever, cough, congestion, myalgia, sore throat.  Differential is influenza, covid, pneumonia.  Fine crackles in left lung base Get Covid, Flu A/B, RSV test Get chest x-ray   Relevant Orders   COVID-19, Flu A+B and RSV   DG Chest 2 View   Fever, unspecified fever cause       Relevant Orders   COVID-19, Flu A+B and RSV   DG Chest 2 View   SOB (shortness of breath)       Relevant Orders   COVID-19, Flu A+B and RSV   DG Chest 2 View      Symptomatic management pending CXR results and possible abx if CAP  Return if symptoms worsen or fail to improve.      Nelva Nay, Medical Student 07/01/2021, 12:22 PM   Patient seen along with MS3 student Nelva Nay. I personally evaluated this patient along with the student, and verified all aspects of the history, physical exam, and medical decision making as documented by the student. I agree with the student's documentation and have made all necessary edits.  Mahala Rommel, [name], MD, MPH [facility]

## 2021-07-03 ENCOUNTER — Encounter: Payer: Self-pay | Admitting: Family Medicine

## 2021-07-03 LAB — COVID-19, FLU A+B AND RSV
Influenza A, NAA: NOT DETECTED
Influenza B, NAA: NOT DETECTED
RSV, NAA: NOT DETECTED
SARS-CoV-2, NAA: NOT DETECTED

## 2021-07-03 LAB — SPECIMEN STATUS REPORT

## 2021-07-05 ENCOUNTER — Other Ambulatory Visit
Admission: RE | Admit: 2021-07-05 | Discharge: 2021-07-05 | Disposition: A | Discharge status: Home / Self Care | Payer: 59 | Attending: Otolaryngology | Admitting: Otolaryngology

## 2021-07-05 ENCOUNTER — Other Ambulatory Visit: Payer: Self-pay | Admitting: Otolaryngology

## 2021-07-05 DIAGNOSIS — J320 Chronic maxillary sinusitis: Secondary | ICD-10-CM | POA: Diagnosis not present

## 2021-07-05 DIAGNOSIS — J329 Chronic sinusitis, unspecified: Secondary | ICD-10-CM | POA: Diagnosis not present

## 2021-07-05 DIAGNOSIS — R053 Chronic cough: Secondary | ICD-10-CM | POA: Diagnosis not present

## 2021-07-05 DIAGNOSIS — R509 Fever, unspecified: Secondary | ICD-10-CM | POA: Diagnosis not present

## 2021-07-05 DIAGNOSIS — J324 Chronic pansinusitis: Secondary | ICD-10-CM

## 2021-07-05 LAB — CBC WITH DIFFERENTIAL/PLATELET
Abs Immature Granulocytes: 0.02 10*3/uL (ref 0.00–0.07)
Basophils Absolute: 0 10*3/uL (ref 0.0–0.1)
Basophils Relative: 1 %
Eosinophils Absolute: 0.1 10*3/uL (ref 0.0–0.5)
Eosinophils Relative: 1 %
HCT: 46.8 % — ABNORMAL HIGH (ref 36.0–46.0)
Hemoglobin: 16.1 g/dL — ABNORMAL HIGH (ref 12.0–15.0)
Immature Granulocytes: 0 %
Lymphocytes Relative: 40 %
Lymphs Abs: 2.3 10*3/uL (ref 0.7–4.0)
MCH: 30.9 pg (ref 26.0–34.0)
MCHC: 34.4 g/dL (ref 30.0–36.0)
MCV: 89.8 fL (ref 80.0–100.0)
Monocytes Absolute: 0.4 10*3/uL (ref 0.1–1.0)
Monocytes Relative: 7 %
Neutro Abs: 3.1 10*3/uL (ref 1.7–7.7)
Neutrophils Relative %: 51 %
Platelets: 255 10*3/uL (ref 150–400)
RBC: 5.21 MIL/uL — ABNORMAL HIGH (ref 3.87–5.11)
RDW: 12.2 % (ref 11.5–15.5)
WBC: 5.9 10*3/uL (ref 4.0–10.5)
nRBC: 0 % (ref 0.0–0.2)

## 2021-07-05 LAB — C-REACTIVE PROTEIN: CRP: 0.8 mg/dL (ref <1.0)

## 2021-07-05 LAB — SEDIMENTATION RATE: Sed Rate: 17 mm/hr (ref 0–30)

## 2021-07-06 ENCOUNTER — Ambulatory Visit
Admission: RE | Admit: 2021-07-06 | Discharge: 2021-07-06 | Disposition: A | Discharge status: Home / Self Care | Payer: 59 | Source: Ambulatory Visit | Attending: Otolaryngology | Admitting: Otolaryngology

## 2021-07-06 ENCOUNTER — Other Ambulatory Visit: Payer: Self-pay

## 2021-07-06 DIAGNOSIS — J324 Chronic pansinusitis: Secondary | ICD-10-CM | POA: Insufficient documentation

## 2021-07-07 ENCOUNTER — Other Ambulatory Visit: Payer: Self-pay

## 2021-07-07 MED ORDER — LEVOFLOXACIN 500 MG PO TABS
ORAL_TABLET | ORAL | 0 refills | Status: DC
  Filled 2021-07-07: qty 10, 10d supply, fill #0

## 2021-07-14 ENCOUNTER — Encounter: Payer: Self-pay | Admitting: Family Medicine

## 2021-07-14 ENCOUNTER — Other Ambulatory Visit: Payer: Self-pay | Admitting: Family Medicine

## 2021-07-14 ENCOUNTER — Ambulatory Visit: Payer: 59

## 2021-07-14 DIAGNOSIS — K581 Irritable bowel syndrome with constipation: Secondary | ICD-10-CM

## 2021-07-14 MED ORDER — LINZESS 290 MCG PO CAPS
290.0000 ug | ORAL_CAPSULE | Freq: Every day | ORAL | 0 refills | Status: DC
  Filled 2021-07-14: qty 90, 90d supply, fill #0

## 2021-07-14 NOTE — Telephone Encounter (Signed)
Requested Prescriptions  Pending Prescriptions Disp Refills   LINZESS 290 MCG CAPS capsule 90 capsule 0    Sig: TAKE 1 CAPSULE BY MOUTH DAILY.     Gastroenterology: Irritable Bowel Syndrome Passed - 07/14/2021 10:32 PM      Passed - Valid encounter within last 12 months    Recent Outpatient Visits          1 week ago Acute cough   [facility] [place], [name], MD   3 months ago Benign essential hypertension   [facility], [name], MD   5 months ago Acute non-recurrent maxillary sinusitis   [facility] [place], [name], MD   9 months ago Essential hypertension   [facility] [place], [name], MD   1 year ago Encounter for annual physical exam   [facility] Bacigalupo, [name], MD      Future Appointments            In 2 months Bacigalupo, [name], MD [facility] [place]

## 2021-07-15 ENCOUNTER — Other Ambulatory Visit: Payer: Self-pay | Admitting: Family Medicine

## 2021-07-15 ENCOUNTER — Other Ambulatory Visit (HOSPITAL_COMMUNITY): Payer: Self-pay

## 2021-07-15 DIAGNOSIS — B379 Candidiasis, unspecified: Secondary | ICD-10-CM

## 2021-07-15 MED ORDER — FLUCONAZOLE 150 MG PO TABS: ORAL_TABLET | ORAL | 0 refills | Status: DC

## 2021-07-24 ENCOUNTER — Other Ambulatory Visit: Payer: Self-pay | Admitting: Family Medicine

## 2021-07-25 ENCOUNTER — Other Ambulatory Visit (HOSPITAL_COMMUNITY): Payer: Self-pay

## 2021-07-25 MED ORDER — BUPROPION HCL ER (XL) 300 MG PO TB24
300.0000 mg | ORAL_TABLET | Freq: Every day | ORAL | 0 refills | Status: DC
  Filled 2021-07-25: qty 90, 90d supply, fill #0

## 2021-07-27 ENCOUNTER — Other Ambulatory Visit (HOSPITAL_COMMUNITY): Payer: Self-pay

## 2021-07-27 DIAGNOSIS — J324 Chronic pansinusitis: Secondary | ICD-10-CM | POA: Diagnosis not present

## 2021-08-01 ENCOUNTER — Other Ambulatory Visit (HOSPITAL_COMMUNITY): Payer: Self-pay

## 2021-08-01 DIAGNOSIS — G5762 Lesion of plantar nerve, left lower limb: Secondary | ICD-10-CM | POA: Diagnosis not present

## 2021-08-01 DIAGNOSIS — M792 Neuralgia and neuritis, unspecified: Secondary | ICD-10-CM | POA: Diagnosis not present

## 2021-08-01 DIAGNOSIS — G894 Chronic pain syndrome: Secondary | ICD-10-CM | POA: Diagnosis not present

## 2021-08-01 DIAGNOSIS — M79672 Pain in left foot: Secondary | ICD-10-CM | POA: Diagnosis not present

## 2021-08-09 ENCOUNTER — Other Ambulatory Visit: Payer: Self-pay | Admitting: Family Medicine

## 2021-08-09 DIAGNOSIS — K219 Gastro-esophageal reflux disease without esophagitis: Secondary | ICD-10-CM

## 2021-08-10 ENCOUNTER — Other Ambulatory Visit (HOSPITAL_COMMUNITY): Payer: Self-pay

## 2021-08-10 MED ORDER — LANSOPRAZOLE 30 MG PO CPDR
DELAYED_RELEASE_CAPSULE | ORAL | 0 refills | Status: DC
  Filled 2021-08-10: qty 180, 90d supply, fill #0

## 2021-08-10 NOTE — Telephone Encounter (Signed)
Requested Prescriptions  ?Pending Prescriptions Disp Refills  ?? lansoprazole (PREVACID) 30 MG capsule 180 capsule 0  ?  Sig: TAKE 1 CAPSULE BY MOUTH TWICE DAILY BEFORE A MEAL.  ?  ? Gastroenterology: Proton Pump Inhibitors 2 Failed - 08/09/2021  9:33 PM  ?  ?  Failed - ALT in normal range and within 360 days  ?  ALT  ?Date Value Ref Range Status  ?05/05/2021 45 (H) 0 - 44 U/L Final  ?   ?  ?  Passed - AST in normal range and within 360 days  ?  AST  ?Date Value Ref Range Status  ?05/05/2021 38 15 - 41 U/L Final  ?   ?  ?  Passed - Valid encounter within last 12 months  ?  Recent Outpatient Visits   ?      ? 1 month ago Acute cough  ? [facility] [place], [name], MD  ? 4 months ago Benign essential hypertension  ? [facility] [place], [name], MD  ? 6 months ago Acute non-recurrent maxillary sinusitis  ? [facility] [place], [name], MD  ? 10 months ago Essential hypertension  ? [facility] Bacigalupo, [name], MD  ? 1 year ago Encounter for annual physical exam  ? [facility], [name], MD  ?  ?  ?Future Appointments   ?        ? In 1 month Bacigalupo, [name], MD [facility]  ?  ? ?  ?  ?  ? ?

## 2021-08-16 ENCOUNTER — Ambulatory Visit
Admission: RE | Admit: 2021-08-16 | Discharge: 2021-08-16 | Disposition: A | Discharge status: Home / Self Care | Payer: 59 | Source: Ambulatory Visit | Attending: Pain Medicine | Admitting: Pain Medicine

## 2021-08-16 ENCOUNTER — Other Ambulatory Visit: Payer: Self-pay

## 2021-08-16 ENCOUNTER — Ambulatory Visit (HOSPITAL_BASED_OUTPATIENT_CLINIC_OR_DEPARTMENT_OTHER): Payer: 59 | Admitting: Pain Medicine

## 2021-08-16 ENCOUNTER — Encounter: Payer: Self-pay | Admitting: Pain Medicine

## 2021-08-16 VITALS — BP 147/84 | HR 79 | Temp 97.2°F | Resp 18 | Ht 62.0 in | Wt 175.0 lb

## 2021-08-16 DIAGNOSIS — G8929 Other chronic pain: Secondary | ICD-10-CM | POA: Insufficient documentation

## 2021-08-16 DIAGNOSIS — M5136 Other intervertebral disc degeneration, lumbar region: Secondary | ICD-10-CM

## 2021-08-16 DIAGNOSIS — M25651 Stiffness of right hip, not elsewhere classified: Secondary | ICD-10-CM | POA: Insufficient documentation

## 2021-08-16 DIAGNOSIS — M5410 Radiculopathy, site unspecified: Secondary | ICD-10-CM

## 2021-08-16 DIAGNOSIS — M47816 Spondylosis without myelopathy or radiculopathy, lumbar region: Secondary | ICD-10-CM | POA: Insufficient documentation

## 2021-08-16 DIAGNOSIS — M79604 Pain in right leg: Secondary | ICD-10-CM

## 2021-08-16 DIAGNOSIS — M47817 Spondylosis without myelopathy or radiculopathy, lumbosacral region: Secondary | ICD-10-CM | POA: Diagnosis not present

## 2021-08-16 DIAGNOSIS — M5416 Radiculopathy, lumbar region: Secondary | ICD-10-CM | POA: Insufficient documentation

## 2021-08-16 DIAGNOSIS — M25551 Pain in right hip: Secondary | ICD-10-CM | POA: Insufficient documentation

## 2021-08-16 DIAGNOSIS — M545 Low back pain, unspecified: Secondary | ICD-10-CM | POA: Insufficient documentation

## 2021-08-16 DIAGNOSIS — Z9049 Acquired absence of other specified parts of digestive tract: Secondary | ICD-10-CM | POA: Diagnosis not present

## 2021-08-16 NOTE — Progress Notes (Signed)
Safety precautions to be maintained throughout the outpatient stay will include: orient to surroundings, keep bed in low position, maintain call bell within reach at all times, provide assistance with transfer out of bed and ambulation.  

## 2021-08-16 NOTE — Progress Notes (Signed)
PROVIDER NOTE: Information contained herein reflects review and annotations entered in association with encounter. Interpretation of such information and data should be left to medically-trained personnel. Information provided to patient can be located elsewhere in the medical record under "Patient Instructions". Document created using STT-dictation technology, any transcriptional errors that may result from process are unintentional.  ?  ?Patient: Elizabeth Bowers  Service Category: E/M  Provider: Gaspar Cola, MD  ?DOB: 03/17/1966  DOS: 08/16/2021  Specialty: Interventional Pain Management  ?MRN: [id number]  Setting: Ambulatory outpatient  PCP: Virginia Crews, MD  ?Type: Established Patient    Referring Provider: Virginia Crews, MD  ?Location: Office  Delivery: Face-to-face    ? ?HPI  ?Elizabeth Bowers, a 56 y.o. year old female, is here today because of her Chronic bilateral low back pain without sciatica [M54.50, G89.29]. [name] primary complain today is Hip Pain (right) and Back Pain (Low, bilaterally) ?Last encounter: My last encounter with her was on Visit date not found. ?Pertinent problems: [name] has Chronic sacroiliac joint pain (Right); Fibromyalgia; Chronic knee pain (Right); DDD (degenerative disc disease), lumbar; Chronic low back pain (2ry area of Pain) (Bilateral) (L>R) w/ sciatica (Bilateral); Chronic lower extremity pain (3ry area of Pain) (Bilateral) (L>R); Chronic neck pain (1ry area of Pain) (Bilateral) (midline); Chronic foot pain (Left); Chronic pain syndrome; Ganglion cyst; Chronic upper extremity pain (Fourth Area of Pain) (Bilateral) (R>L); Cervical Grade 1 (2 mm) Retrolisthesis C5 over C6; Cervical foraminal stenosis (C3-4 & C5-6) (Bilateral); DDD (degenerative disc disease), cervical; Spondylosis without myelopathy or radiculopathy, cervical region; Chronic hip pain (Bilateral) (L>R); Lumbar facet syndrome (Bilateral) (R>L); Spondylosis without myelopathy or  radiculopathy, lumbar region; Chronic Sacroiliac joint dysfunction (Bilateral) (R>L); Chronic Somatic dysfunction of sacroiliac joint (Bilateral) (R>L); Osteoarthritis of hip (Bilateral); Cervicogenic headache (Bilateral); Cervicalgia (Bilateral) (R>L); Cervical facet syndrome (Bilateral) (R>L); Chronic occipital neuralgia (Bilateral); Cervical spondylitis with radiculitis (HCC) (C6) (Bilateral) (R>L); Chronic sacroiliac joint pain (Bilateral) (R>L); Abnormal MRI, cervical spine (07/10/2018); Chronic low back pain (Bilateral) w/o sciatica; Morton's neuroma of foot (Left); Chronic hip pain (Right); Impaired range of motion of hip (Right); Chronic lower extremity pain (Right); Lower extremity radicular pain (Right); and Lumbar radiculitis (L3/L4) (Right) on their pertinent problem list. ?Pain Assessment: Severity of Chronic pain is reported as a 5 /10. Location: Hip Right/low back. Onset: More than a month ago. Quality: Burning (hot poker). Timing: Intermittent. Modifying factor(s): lying down, positioning, topicals,. ?Vitals:  height is '5\' 2"'$  (1.575 m) and weight is 175 lb (79.4 kg). Her temporal temperature is 97.2 ?F (36.2 ?C) (abnormal). Her blood pressure is 147/84 (abnormal) and her pulse is 79. Her respiration is 18 and oxygen saturation is 99%.  ? ?Reason for encounter: evaluation of worsening, or previously known (established) problem.  The patient comes into the clinic today indicating having a flareup of her low back pain and right lower extremity pain.  She describes the low back pain to be worse than the lower extremity pain.  The low back pain is described to be bilateral and running from 1 side to the other.  Physical exam today demonstrated reproduction of the low back pain on performing the provocative Kemp maneuver and hyperextension on rotation.  This was positive bilaterally for ipsilateral lumbar facet arthralgia.  Provocative Patrick maneuver was positive on the right side for right hip arthralgia  and significant decreased range of motion but it was also positive for referral of pain towards the middle of the lower back.  In terms of the lower extremity pain the patient describes that it starts in the buttocks area and travels laterally and then anteriorly over the anterior thigh going down into her right knee and what appears to be an L3/L4 dermatomal distribution.  The patient denies any pain or numbness from the knee down.  She was able to toe walk and heel walk without any significant problems. ? ?At this point the patient appears to be having pain coming from the right hip joint, that lumbar facets, and they Lumbar spine with a possible right elbow 3/L4 radiculitis/sensory radiculopathy.  Today I have ordered some x-rays to evaluate those areas.  We have also tentatively schedule the patient to return for a right-sided L3-4 vs L4-5 LESI #1 under fluoroscopic guidance.  The plan was shared with the patient who understood and accepted. ? ?The patient also commented occasionally having a foot drop on the right side.  She denies this happening on a regular basis, but she refers that it is the reason why she no longer climbs stairs.  She has also has difficulty with occasionally tripping with her right foot as she is unable to bring it up.  However, today she does not seem to have any problems with this since she was able to heel walk and toe walk without any problems at all.  She also does not have pain or numbness in the L5 distribution. ? ?Pharmacotherapy Assessment  ?Analgesic: No chronic opioid analgesics therapy prescribed by our practice.  ? ?Monitoring: ?[place] PMP: PDMP reviewed during this encounter.       ?Pharmacotherapy: No side-effects or adverse reactions reported. ?Compliance: No problems identified. ?Effectiveness: Clinically acceptable. ? ?[name] RN  08/16/2021  1:37 PM  Signed ?Safety precautions to be maintained throughout the outpatient stay will include: orient to surroundings,  keep bed in low position, maintain call bell within reach at all times, provide assistance with transfer out of bed and ambulation.  ?   UDS:  ?Summary  ?Date Value Ref Range Status  ?01/16/2018 FINAL  Final  ?  Comment:  ?  ==================================================================== ?[facility]UG [facility]NALYSIS[facility] ?==================================================================== ?Test                             Result       Flag       Units ?Drug Present and Declared for Prescription Verification ?  Oxazepam                       51           EXPECTED   ng/mg creat ?  Temazepam                      44           EXPECTED   ng/mg creat ?   Oxazepam and temazepam are expected metabolites of diazepam. ?   Oxazepam is also an expected metabolite of other benzodiazepine ?   drugs, including chlordiazepoxide, prazepam, clorazepate, ?   halazepam, and temazepam.  Oxazepam and temazepam are available ?   as scheduled prescription medications. ?  Phentermine                    PRESENT      EXPECTED ?  Duloxetine  PRESENT      EXPECTED ?Drug Present not Declared for Prescription Verification ?  Topiramate                     PRESENT      UNEXPECTED ?  Acetaminophen                  PRESENT      UNEXPECTED ?Drug Absent but Declared for Prescription Verification ?  Cyclobenzaprine                Not Detected UNEXPECTED ?  Promethazine                   Not Detected UNEXPECTED ?==================================================================== ?Test                      Result    Flag   Units      Ref Range ?  Creatinine              57               mg/dL      >=20 ?==================================================================== ?Declared Medications: ? The flagging and interpretation on this report are based on the ? following declared medications.  Unexpected results may arise from ? inaccuracies in the declared medications. ? **Note: The testing scope of this panel includes these  medications: ? Cyclobenzaprine ? Diazepam ? Duloxetine ? Phentermine ? Promethazine ? **Note: The testing scope of this panel does not include following ? reported medications: ? Acyclovir ? Budesonide ? Docusa

## 2021-08-16 NOTE — Patient Instructions (Signed)
______________________________________________________________________ ? ?Preparing for Procedure with Sedation ? ?NOTICE: Due to recent regulatory changes, starting on January 03, 2021, procedures requiring intravenous (IV) sedation will no longer be performed at the [facility].  These types of procedures are required to be performed at [facility] ambulatory surgery facility.  We are very sorry for the inconvenience. ? ?Procedure appointments are limited to planned procedures: ?No Prescription Refills. ?No disability issues will be discussed. ?No medication changes will be discussed. ? ?Instructions: ?Oral Intake: Do not eat or drink anything for at least 8 hours prior to your procedure. (Exception: Blood Pressure Medication. See below.) ?Transportation: A driver is required. You may not drive yourself after the procedure. ?Blood Pressure Medicine: Do not forget to take your blood pressure medicine with a sip of water the morning of the procedure. If your Diastolic (lower reading) is above 100 mmHg, elective cases will be cancelled/rescheduled. ?Blood thinners: These will need to be stopped for procedures. Notify our staff if you are taking any blood thinners. Depending on which one you take, there will be specific instructions on how and when to stop it. ?Diabetics on insulin: Notify the staff so that you can be scheduled 1st case in the morning. If your diabetes requires high dose insulin, take only ? of your normal insulin dose the morning of the procedure and notify the staff that you have done so. ?Preventing infections: Shower with an antibacterial soap the morning of your procedure. ?Build-up your immune system: Take 1000 mg of Vitamin C with every meal (3 times a day) the day prior to your procedure. ?Antibiotics: Inform the staff if you have a condition or reason that requires you to take antibiotics before dental procedures. ?Pregnancy: If you are pregnant, call and cancel the procedure. ?Sickness: If  you have a cold, fever, or any active infections, call and cancel the procedure. ?Arrival: You must be in the facility at least 30 minutes prior to your scheduled procedure. ?Children: Do not bring children with you. ?Dress appropriately: Bring dark clothing that you would not mind if they get stained. ?Valuables: Do not bring any jewelry or valuables. ? ?Reasons to call and reschedule or cancel your procedure: (Following these recommendations will minimize the risk of a serious complication.) ?Surgeries: Avoid having procedures within 2 weeks of any surgery. (Avoid for 2 weeks before or after any surgery). ?Flu Shots: Avoid having procedures within 2 weeks of a flu shots. (Avoid for 2 weeks before or after immunizations). ?Barium: Avoid having a procedure within 7-10 days after having had a radiological study involving the use of radiological contrast. (Myelograms, Barium swallow or enema study). ?Heart attacks: Avoid any elective procedures or surgeries for the initial 6 months after a "Myocardial Infarction" (Heart Attack). ?Blood thinners: It is imperative that you stop these medications before procedures. Let us know if you if you take any blood thinner.  ?Infection: Avoid procedures during or within two weeks of an infection (including chest colds or gastrointestinal problems). Symptoms associated with infections include: Localized redness, fever, chills, night sweats or profuse sweating, burning sensation when voiding, cough, congestion, stuffiness, runny nose, sore throat, diarrhea, nausea, vomiting, cold or Flu symptoms, recent or current infections. It is specially important if the infection is over the area that we intend to treat. ?Heart and lung problems: Symptoms that may suggest an active cardiopulmonary problem include: cough, chest pain, breathing difficulties or shortness of breath, dizziness, ankle swelling, uncontrolled high or unusually low blood pressure, and/or palpitations. If you are    experiencing any of these symptoms, cancel your procedure and contact your primary care physician for an evaluation. ? ?Remember:  ?Regular Business hours are:  ?Monday to Thursday 8:00 AM to 4:00 PM ? ?Provider's Schedule: ?Shadai Mcclane, MD:  ?Procedure days: Tuesday and Thursday 7:30 AM to 4:00 PM ? ?Bilal Lateef, MD:  ?Procedure days: Monday and Wednesday 7:30 AM to 4:00 PM ?______________________________________________________________________ ? ____________________________________________________________________________________________ ? ?General Risks and Possible Complications ? ?Patient Responsibilities: It is important that you read this as it is part of your informed consent. It is our duty to inform you of the risks and possible complications associated with treatments offered to you. It is your responsibility as a patient to read this and to ask questions about anything that is not clear or that you believe was not covered in this document. ? ?Patient?s Rights: You have the right to refuse treatment. You also have the right to change your mind, even after initially having agreed to have the treatment done. However, under this last option, if you wait until the last second to change your mind, you may be charged for the materials used up to that point. ? ?Introduction: Medicine is not an exact science. Everything in Medicine, including the lack of treatment(s), carries the potential for danger, harm, or loss (which is by definition: Risk). In Medicine, a complication is a secondary problem, condition, or disease that can aggravate an already existing one. All treatments carry the risk of possible complications. The fact that a side effects or complications occurs, does not imply that the treatment was conducted incorrectly. It must be clearly understood that these can happen even when everything is done following the highest safety standards. ? ?No treatment: You can choose not to proceed with the  proposed treatment alternative. The ?PRO(s)? would include: avoiding the risk of complications associated with the therapy. The ?CON(s)? would include: not getting any of the treatment benefits. These benefits fall under one of three categories: diagnostic; therapeutic; and/or palliative. Diagnostic benefits include: getting information which can ultimately lead to improvement of the disease or symptom(s). Therapeutic benefits are those associated with the successful treatment of the disease. Finally, palliative benefits are those related to the decrease of the primary symptoms, without necessarily curing the condition (example: decreasing the pain from a flare-up of a chronic condition, such as incurable terminal cancer). ? ?General Risks and Complications: These are associated to most interventional treatments. They can occur alone, or in combination. They fall under one of the following six (6) categories: no benefit or worsening of symptoms; bleeding; infection; nerve damage; allergic reactions; and/or death. ?No benefits or worsening of symptoms: In Medicine there are no guarantees, only probabilities. No healthcare provider can ever guarantee that a medical treatment will work, they can only state the probability that it may. Furthermore, there is always the possibility that the condition may worsen, either directly, or indirectly, as a consequence of the treatment. ?Bleeding: This is more common if the patient is taking a blood thinner, either prescription or over the counter (example: Goody Powders, Fish oil, Aspirin, Garlic, etc.), or if suffering a condition associated with impaired coagulation (example: Hemophilia, cirrhosis of the liver, low platelet counts, etc.). However, even if you do not have one on these, it can still happen. If you have any of these conditions, or take one of these drugs, make sure to notify your treating physician. ?Infection: This is more common in patients with a compromised  immune system, either due to disease (example:   diabetes, cancer, human immunodeficiency virus [HIV], etc.), or due to medications or treatments (example: therapies used to treat cancer and rheumatological diseas

## 2021-08-17 ENCOUNTER — Other Ambulatory Visit (HOSPITAL_COMMUNITY): Payer: Self-pay

## 2021-08-18 ENCOUNTER — Other Ambulatory Visit: Payer: Self-pay

## 2021-08-19 ENCOUNTER — Other Ambulatory Visit: Payer: Self-pay

## 2021-08-19 MED ORDER — AMOXICILLIN-POT CLAVULANATE 875-125 MG PO TABS
ORAL_TABLET | ORAL | 0 refills | Status: DC
  Filled 2021-08-19: qty 20, 10d supply, fill #0

## 2021-08-22 ENCOUNTER — Ambulatory Visit
Admission: RE | Admit: 2021-08-22 | Discharge: 2021-08-22 | Disposition: A | Discharge status: Home / Self Care | Payer: 59 | Source: Ambulatory Visit | Attending: Emergency Medicine | Admitting: Emergency Medicine

## 2021-08-22 ENCOUNTER — Other Ambulatory Visit: Payer: Self-pay

## 2021-08-22 VITALS — BP 155/92 | HR 82 | Temp 99.1°F | Resp 18

## 2021-08-22 DIAGNOSIS — R0982 Postnasal drip: Secondary | ICD-10-CM

## 2021-08-22 DIAGNOSIS — J040 Acute laryngitis: Secondary | ICD-10-CM | POA: Diagnosis not present

## 2021-08-22 MED ORDER — IPRATROPIUM BROMIDE 0.06 % NA SOLN
2.0000 | Freq: Four times a day (QID) | NASAL | 12 refills | Status: DC
  Filled 2021-08-22: qty 15, 30d supply, fill #0

## 2021-08-22 NOTE — ED Triage Notes (Signed)
Pt reports feeling like something is stuck in throat.  Not so much a sore throat. Happened while at work on Friday.  Lost her voice that day too.  She is on amoxicillin for sinus infection and has been using throat lozenges, the more she used them the worse her voice became.  Difficulty sleeping d/t cough.  ?

## 2021-08-22 NOTE — Discharge Instructions (Signed)
Use the Atrovent nasal spray, 2 squirts in each nostril every 6 hours, as needed for postnasal drip. ? ?Rest your voice. ? ?Drink cool or warm fluids. This is based upon your preference and comfort. ? ?If your symptoms do not improve please return for re-evaluation or see your PCP.  ?

## 2021-08-22 NOTE — ED Provider Notes (Signed)
?Kelly ? ? ? ?CSN: [id number] ?Arrival date & time: 08/22/21  1442 ? ? ?  ? ?History   ?Chief Complaint ?Chief Complaint  ?Patient presents with  ? Sore Throat  ? ? ?HPI ?Elizabeth Bowers is a 56 y.o. female.  ? ?HPI ? ?62 old female here for evaluation of sore throat. ? ?Patient reports that her throat is still sore and more that she feels like she has something stuck in the back of her throat.  She describes it as when you have a popcorn kernel stuck in the back of her throat.  She states this happened at work while she was sucking on some Chloraseptic lozenges and she is wondering if a lozenge may have caused her symptoms.  The same day she lost her voice and she continues to have a very muffled voice.  She does endorse a cough that is only intermittently productive for green sputum but for the most part is dry.  She does have runny nose, nasal congestion, and postnasal drip as well.  This did not happen after eating or drinking any fluids and she denies any fever. ? ?Past Medical History:  ?Diagnosis Date  ? Fatty liver   ? Fibromyalgia   ? GERD (gastroesophageal reflux disease)   ? Hemorrhoids   ? Hot flashes   ? Hypertension   ? IBS (irritable bowel syndrome)   ? LUQ abdominal pain 10/25/2016  ? Migraines   ? Miscarriage   ? x 2; 4 live births   ? Multilevel degenerative disc disease   ? Neuroma   ? Vocal cord dysfunction   ? ? ?Patient Active Problem List  ? Diagnosis Date Noted  ? Chronic hip pain (Right) 08/16/2021  ? Impaired range of motion of hip (Right) 08/16/2021  ? Chronic lower extremity pain (Right) 08/16/2021  ? Lower extremity radicular pain (Right) 08/16/2021  ? Lumbar radiculitis (L3/L4) (Right) 08/16/2021  ? Morton's neuroma of foot (Left) 11/15/2020  ? MDD (major depressive disorder) 10/01/2020  ? Anterior tibialis tendinitis, right 04/28/2020  ? Insomnia 07/07/2019  ? Benign essential hypertension 04/22/2019  ? Radial styloid tenosynovitis of left hand 01/13/2019  ? Pain in  right hand 01/13/2019  ? Bilateral carpal tunnel syndrome 01/13/2019  ? Chronic pain of left wrist 01/13/2019  ? Chronic low back pain (Bilateral) w/o sciatica 08/08/2018  ? Abnormal MRI, cervical spine (07/10/2018) 07/17/2018  ? Cervical spondylitis with radiculitis (HCC) (C6) (Bilateral) (R>L) 06/26/2018  ? Chronic sacroiliac joint pain (Bilateral) (R>L) 06/26/2018  ? Cervical Grade 1 (2 mm) Retrolisthesis C5 over C6 03/20/2018  ? Cervical foraminal stenosis (C3-4 & C5-6) (Bilateral) 03/20/2018  ? DDD (degenerative disc disease), cervical 03/20/2018  ? Long term prescription benzodiazepine use 03/20/2018  ? Stage 3 chronic kidney disease ([place]) 03/20/2018  ? Acid reflux disease 03/20/2018  ? Spondylosis without myelopathy or radiculopathy, cervical region 03/20/2018  ? Chronic hip pain (Bilateral) (L>R) 03/20/2018  ? Lumbar facet syndrome (Bilateral) (R>L) 03/20/2018  ? Spondylosis without myelopathy or radiculopathy, lumbar region 03/20/2018  ? Chronic Sacroiliac joint dysfunction (Bilateral) (R>L) 03/20/2018  ? Chronic Somatic dysfunction of sacroiliac joint (Bilateral) (R>L) 03/20/2018  ? Osteoarthritis of hip (Bilateral) 03/20/2018  ? Cervicogenic headache (Bilateral) 03/20/2018  ? Cervicalgia (Bilateral) (R>L) 03/20/2018  ? Cervical facet syndrome (Bilateral) (R>L) 03/20/2018  ? Chronic occipital neuralgia (Bilateral) 03/20/2018  ? Chronic upper extremity pain (Fourth Area of Pain) (Bilateral) (R>L) 03/19/2018  ? Ganglion cyst 02/20/2018  ? Chronic low back pain (  2ry area of Pain) (Bilateral) (L>R) w/ sciatica (Bilateral) 01/16/2018  ? Chronic lower extremity pain (3ry area of Pain) (Bilateral) (L>R) 01/16/2018  ? Chronic neck pain (1ry area of Pain) (Bilateral) (midline) 01/16/2018  ? Chronic foot pain (Left) 01/16/2018  ? Chronic pain syndrome 01/16/2018  ? Pharmacologic therapy 01/16/2018  ? Disorder of skeletal system 01/16/2018  ? Problems influencing health status 01/16/2018  ? Obesity 11/21/2017  ?  Anxiety 09/28/2017  ? Chronic knee pain (Right) 09/28/2017  ? Fatty liver 07/09/2017  ? Colon polyps 07/09/2017  ? Fibromyalgia 07/09/2017  ? Sinusitis, chronic 07/09/2017  ? Allergic rhinitis 07/09/2017  ? IBS (irritable bowel syndrome) 07/09/2017  ? Eyelid dermatitis, allergic/contact 07/09/2017  ? Superficial thrombophlebitis 01/12/2017  ? Chronic sacroiliac joint pain (Right) 12/16/2016  ? DDD (degenerative disc disease), lumbar 07/18/2016  ? Family hx of colon cancer 07/18/2016  ? ? ?Past Surgical History:  ?Procedure Laterality Date  ? ABDOMINAL HYSTERECTOMY    ? 2012 removed cervix h/o abnormal pap   ? CHOLECYSTECTOMY    ? 2000  ? COLONOSCOPY    ? 2018 with + polpys and GIB 2/2 polyp removal   ? COLONOSCOPY WITH PROPOFOL N/A 12/22/2019  ? Procedure: COLONOSCOPY WITH PROPOFOL;  Surgeon: Lin Landsman, MD;  Location: [facility] ENDOSCOPY;  Service: Gastroenterology;  Laterality: N/A;  ? COLONOSCOPY WITH PROPOFOL N/A 12/23/2019  ? Procedure: COLONOSCOPY WITH PROPOFOL;  Surgeon: Lin Landsman, MD;  Location: [facility] ENDOSCOPY;  Service: Gastroenterology;  Laterality: N/A;  Last name pronounced LEE-MA  ? ELBOW DEBRIDEMENT    ? ESOPHAGOGASTRODUODENOSCOPY (EGD) WITH PROPOFOL N/A 02/06/2018  ? Procedure: ESOPHAGOGASTRODUODENOSCOPY (EGD) WITH PROPOFOL with biopsies;  Surgeon: Lin Landsman, MD;  Location: [place];  Service: Endoscopy;  Laterality: N/A;  ? FOOT SURGERY    ? ? ?OB History   ?No obstetric history on file. ?  ? ? ? ?Home Medications   ? ?Prior to Admission medications   ?Medication Sig Start Date End Date Taking? Authorizing Provider  ?amLODipine (NORVASC) 2.5 MG tablet Take 1 tablet (2.5 mg total) by mouth daily. 06/09/21 09/07/21 Yes Bacigalupo, [name], MD  ?amoxicillin-clavulanate (AUGMENTIN) 875-125 MG tablet Administer 1 tablet orally every 12 hours for 10 days. 08/19/21  Yes   ?buPROPion (WELLBUTRIN XL) 300 MG 24 hr tablet Take 1 tablet (300 mg total) by mouth daily. 07/25/21  Yes  Bacigalupo, [name], MD  ?DULoxetine (CYMBALTA) 60 MG capsule TAKE 1 CAPSULE BY MOUTH DAILY. 05/21/21 05/21/22 Yes Bacigalupo, [name], MD  ?fluconazole (DIFLUCAN) 150 MG tablet Take medication and if symptoms remain on dose 4- take second dose, by mouth. 07/15/21  Yes Gwyneth Sprout, FNP  ?fluticasone (FLONASE) 50 MCG/ACT nasal spray Place 2 sprays into both nostrils daily. 06/22/21  Yes Hughie Closs, PA-C  ?ipratropium (ATROVENT) 0.06 % nasal spray Place 2 sprays into both nostrils 4 (four) times daily. 08/22/21  Yes Margarette Canada, NP  ?lansoprazole (PREVACID) 30 MG capsule TAKE 1 CAPSULE BY MOUTH TWICE DAILY BEFORE A MEAL. 08/10/21 08/10/22 Yes Bacigalupo, [name], MD  ?Rolan Lipa 290 MCG CAPS capsule TAKE 1 CAPSULE BY MOUTH DAILY. 07/14/21 07/14/22 Yes Bacigalupo, [name], MD  ?Menaquinone-7 (VITAMIN K2 PO) Take by mouth.   Yes [provider]  ?Multiple Vitamin (MULTIVITAMIN WITH MINERALS) TABS tablet Take 1 tablet by mouth daily.   Yes [provider]  ?promethazine (PHENERGAN) 25 MG suppository Place 1 suppository (25 mg total) rectally every 6 (six) hours as needed for nausea  or vomiting. 05/05/21  Yes Rodriguez-Southworth, Sunday Spillers, PA-C  ?psyllium (METAMUCIL) 58.6 % powder Take 2 packets by mouth daily.   Yes [provider]  ?triamterene-hydrochlorothiazide (MAXZIDE-25) 37.5-25 MG tablet TAKE 1 TABLET BY MOUTH DAILY. 06/09/21 06/09/22 Yes Bacigalupo, [name], MD  ?vitamin C (ASCORBIC ACID) 500 MG tablet Take 500 mg by mouth daily.   Yes [provider]  ? ? ?Family History ?Family History  ?Problem Relation Age of Onset  ? Hypertension Mother   ? Hyperlipidemia Mother   ? Glaucoma Mother   ? Diabetes Mother   ?     type 2   ? COPD Father   ?     emphysema  ? Alcohol abuse Father   ? Colon cancer Sister   ? Cancer Sister   ?     rectal/colon cancer no h/o IBD  ? Colon polyps Sister   ? Asthma Sister   ? COPD Sister   ? Kidney disease Sister   ? Mental illness Sister   ?     bipolar    ? Miscarriages / Stillbirths Sister   ? Uterine cancer Sister   ? Alcohol abuse Son   ? Depression Son   ? Diabetes Son   ? Drug abuse Son   ? Mental illness Son   ?     bipolar   ? Diabetes Maternal Grandmother

## 2021-09-03 ENCOUNTER — Other Ambulatory Visit (HOSPITAL_COMMUNITY): Payer: Self-pay

## 2021-09-05 ENCOUNTER — Other Ambulatory Visit (HOSPITAL_COMMUNITY): Payer: Self-pay

## 2021-09-08 ENCOUNTER — Ambulatory Visit
Admission: RE | Admit: 2021-09-08 | Discharge: 2021-09-08 | Disposition: A | Discharge status: Home / Self Care | Payer: 59 | Source: Ambulatory Visit | Attending: Pain Medicine | Admitting: Pain Medicine

## 2021-09-08 ENCOUNTER — Ambulatory Visit (HOSPITAL_BASED_OUTPATIENT_CLINIC_OR_DEPARTMENT_OTHER): Payer: 59 | Admitting: Pain Medicine

## 2021-09-08 ENCOUNTER — Encounter: Payer: Self-pay | Admitting: Pain Medicine

## 2021-09-08 VITALS — BP 148/82 | HR 66 | Temp 96.7°F | Resp 16 | Ht 62.0 in | Wt 175.0 lb

## 2021-09-08 DIAGNOSIS — M5442 Lumbago with sciatica, left side: Secondary | ICD-10-CM

## 2021-09-08 DIAGNOSIS — G8929 Other chronic pain: Secondary | ICD-10-CM

## 2021-09-08 DIAGNOSIS — M79604 Pain in right leg: Secondary | ICD-10-CM | POA: Insufficient documentation

## 2021-09-08 DIAGNOSIS — M5410 Radiculopathy, site unspecified: Secondary | ICD-10-CM

## 2021-09-08 DIAGNOSIS — M5416 Radiculopathy, lumbar region: Secondary | ICD-10-CM | POA: Insufficient documentation

## 2021-09-08 DIAGNOSIS — M5136 Other intervertebral disc degeneration, lumbar region: Secondary | ICD-10-CM | POA: Diagnosis not present

## 2021-09-08 DIAGNOSIS — M5441 Lumbago with sciatica, right side: Secondary | ICD-10-CM

## 2021-09-08 MED ORDER — MIDAZOLAM HCL 5 MG/5ML IJ SOLN
0.5000 mg | Freq: Once | INTRAMUSCULAR | Status: AC
  Administered 2021-09-08: 2 mg via INTRAVENOUS
  Filled 2021-09-08: qty 5

## 2021-09-08 MED ORDER — LACTATED RINGERS IV SOLN
1000.0000 mL | Freq: Once | INTRAVENOUS | Status: AC
  Administered 2021-09-08: 1000 mL via INTRAVENOUS

## 2021-09-08 MED ORDER — LIDOCAINE HCL 2 % IJ SOLN
20.0000 mL | Freq: Once | INTRAMUSCULAR | Status: AC
  Administered 2021-09-08: 400 mg
  Filled 2021-09-08: qty 20

## 2021-09-08 MED ORDER — IOHEXOL 180 MG/ML  SOLN
10.0000 mL | Freq: Once | INTRAMUSCULAR | Status: AC
  Administered 2021-09-08: 10 mL via EPIDURAL

## 2021-09-08 MED ORDER — ROPIVACAINE HCL 2 MG/ML IJ SOLN
2.0000 mL | Freq: Once | INTRAMUSCULAR | Status: AC
  Administered 2021-09-08: 2 mL via EPIDURAL
  Filled 2021-09-08: qty 20

## 2021-09-08 MED ORDER — SODIUM CHLORIDE 0.9% FLUSH
2.0000 mL | Freq: Once | INTRAVENOUS | Status: AC
  Administered 2021-09-08: 2 mL

## 2021-09-08 MED ORDER — TRIAMCINOLONE ACETONIDE 40 MG/ML IJ SUSP
40.0000 mg | Freq: Once | INTRAMUSCULAR | Status: AC
  Administered 2021-09-08: 40 mg
  Filled 2021-09-08: qty 1

## 2021-09-08 MED ORDER — PENTAFLUOROPROP-TETRAFLUOROETH EX AERO: INHALATION_SPRAY | Freq: Once | CUTANEOUS | Status: DC

## 2021-09-08 NOTE — Patient Instructions (Addendum)
____________________________________________________________________________________________ ? ?Post-Procedure Discharge Instructions ? ?Instructions: ?Apply ice:  ?Purpose: This will minimize any swelling and discomfort after procedure.  ?When: Day of procedure, as soon as you get home. ?How: Fill a plastic sandwich bag with crushed ice. Cover it with a small towel and apply to injection site. ?How long: (15 min on, 15 min off) Apply for 15 minutes then remove x 15 minutes.  Repeat sequence on day of procedure, until you go to bed. ?Apply heat:  ?Purpose: To treat any soreness and discomfort from the procedure. ?When: Starting the next day after the procedure. ?How: Apply heat to procedure site starting the day following the procedure. ?How long: May continue to repeat daily, until discomfort goes away. ?Food intake: Start with clear liquids (like water) and advance to regular food, as tolerated.  ?Physical activities: Keep activities to a minimum for the first 8 hours after the procedure. After that, then as tolerated. ?Driving: If you have received any sedation, be responsible and do not drive. You are not allowed to drive for 24 hours after having sedation. ?Blood thinner: (Applies only to those taking blood thinners) You may restart your blood thinner 6 hours after your procedure. ?Insulin: (Applies only to Diabetic patients taking insulin) As soon as you can eat, you may resume your normal dosing schedule. ?Infection prevention: Keep procedure site clean and dry. Shower daily and clean area with soap and water. ?Post-procedure Pain Diary: Extremely important that this be done correctly and accurately. Recorded information will be used to determine the next step in treatment. For the purpose of accuracy, follow these rules: ?Evaluate only the area treated. Do not report or include pain from an untreated area. For the purpose of this evaluation, ignore all other areas of pain, except for the treated area. ?After  your procedure, avoid taking a long nap and attempting to complete the pain diary after you wake up. Instead, set your alarm clock to go off every hour, on the hour, for the initial 8 hours after the procedure. Document the duration of the numbing medicine, and the relief you are getting from it. ?Do not go to sleep and attempt to complete it later. It will not be accurate. If you received sedation, it is likely that you were given a medication that may cause amnesia. Because of this, completing the diary at a later time may cause the information to be inaccurate. This information is needed to plan your care. ?Follow-up appointment: Keep your post-procedure follow-up evaluation appointment after the procedure (usually 2 weeks for most procedures, 6 weeks for radiofrequencies). DO NOT FORGET to bring you pain diary with you.  ? ?Expect: (What should I expect to see with my procedure?) ?From numbing medicine (AKA: Local Anesthetics): Numbness or decrease in pain. You may also experience some weakness, which if present, could last for the duration of the local anesthetic. ?Onset: Full effect within 15 minutes of injected. ?Duration: It will depend on the type of local anesthetic used. On the average, 1 to 8 hours.  ?From steroids (Applies only if steroids were used): Decrease in swelling or inflammation. Once inflammation is improved, relief of the pain will follow. ?Onset of benefits: Depends on the amount of swelling present. The more swelling, the longer it will take for the benefits to be seen. In some cases, up to 10 days. ?Duration: Steroids will stay in the system x 2 weeks. Duration of benefits will depend on multiple posibilities including persistent irritating factors. ?Side-effects: If present, they  may typically last 2 weeks (the duration of the steroids). ?Frequent: Cramps (if they occur, drink Gatorade and take over-the-counter Magnesium 450-500 mg once to twice a day); water retention with temporary  weight gain; increases in blood sugar; decreased immune system response; increased appetite. ?Occasional: Facial flushing (red, warm cheeks); mood swings; menstrual changes. ?Uncommon: Long-term decrease or suppression of natural hormones; bone thinning. (These are more common with higher doses or more frequent use. This is why we prefer that our patients avoid having any injection therapies in other practices.)  ?Very Rare: Severe mood changes; psychosis; aseptic necrosis. ?From procedure: Some discomfort is to be expected once the numbing medicine wears off. This should be minimal if ice and heat are applied as instructed. ? ?Call if: (When should I call?) ?You experience numbness and weakness that gets worse with time, as opposed to wearing off. ?New onset bowel or bladder incontinence. (Applies only to procedures done in the spine) ? ?Emergency Numbers: ?[place] business hours (Monday - Thursday, 8:00 AM - 4:00 PM) (Friday, 9:00 AM - 12:00 Noon): [phone] ?After hours: [phone] ?NOTE: If you are having a problem and are unable connect with, or to talk to a provider, then go to your nearest urgent care or emergency department. If the problem is serious and urgent, please call 911. ?____________________________________________________________________________________________ ? Post-procedure Information ?What to expect: ?Most procedures involve the use of a local anesthetic (numbing medicine), and a steroid (anti-inflammatory medicine). ? ?The local anesthetics may cause temporary numbness and weakness of the legs or arms, depending on the location of the block. This numbness/weakness may last 4-6 hours, depending on the local anesthetic used. In rare instances, it can last up to 24 hours. While numb, you must be very careful not to injure the extremity. ? ?After any procedure, you could expect the pain to get better within 15-20 minutes. This relief is temporary and may last 4-6 hours. Once the local  anesthetics wears off, you could experience discomfort, possibly more than usual, for up to 10 (ten) days. In the case of radiofrequencies, it may last up to 6 weeks. Surgeries may take up to 8 weeks for the healing process. The discomfort is due to the irritation caused by needles going through skin and muscle. To minimize the discomfort, we recommend using ice the first day, and heat from then on. The ice should be applied for 15 minutes on, and 15 minutes off. Keep repeating this cycle until bedtime. Avoid applying the ice directly to the skin, to prevent frostbite. Heat should be used daily, until the pain improves (4-10 days). Be careful not to burn yourself. ? ?Occasionally you may experience muscle spasms or cramps. These occur as a consequence of the irritation caused by the needle sticks to the muscle and the blood that will inevitably be lost into the surrounding muscle tissue. Blood tends to be very irritating to tissues, which tend to react by going into spasm. These spasms may start the same day of your procedure, but they may also take days to develop. This late onset type of spasm or cramp is usually caused by electrolyte imbalances triggered by the steroids, at the level of the kidney. Cramps and spasms tend to respond well to muscle relaxants, multivitamins (some are triggered by the procedure, but may have their origins in vitamin deficiencies), and ?Gatorade?, or any sports drinks that can replenish any electrolyte imbalances. (If you are a diabetic, ask your pharmacist to get you a sugar-free brand.) Warm  showers or baths may also be helpful. Stretching exercises are highly recommended. ?General Instructions:  ?Be alert for signs of possible infection: redness, swelling, heat, red streaks, elevated temperature, and/or fever. These typically appear 4 to 6 days after the procedure. Immediately notify your doctor if you experience unusual bleeding, difficulty breathing, or loss of bowel or bladder  control. If you experience increased pain, do not increase your pain medicine intake, unless instructed by your pain physician. ?Post-Procedure Care: ? Be careful in moving about. Muscle spasms in the area

## 2021-09-08 NOTE — Progress Notes (Signed)
PROVIDER NOTE: Interpretation of information contained herein should be left to medically-trained personnel. Specific patient instructions are provided elsewhere under "Patient Instructions" section of medical record. This document was created in part using STT-dictation technology, any transcriptional errors that may result from this process are unintentional.  ?Patient: Elizabeth Bowers ?Type: Established ?DOB: 1965-10-04 ?MRN: [id number] ?PCP: Virginia Crews, MD  Service: Procedure ?DOS: 09/08/2021 ?Setting: Ambulatory ?Location: Ambulatory outpatient facility ?Delivery: Face-to-face Provider: Gaspar Cola, MD ?Specialty: Interventional Pain Management ?Specialty designation: 09 ?Location: Outpatient facility ?Ref. Prov.: Bacigalupo, [name], MD   ? ?Primary Reason for Visit: Interventional Pain Management Treatment. ?CC: Back Pain (lower) ?  ?Procedure:          ? Type: Lumbar epidural steroid injection (LESI) (interlaminar) #1    ?Laterality: Right   ?Level:  L3-4 Level.  ?Imaging: Fluoroscopic guidance ?Anesthesia: Local anesthesia (1-2% Lidocaine) ?Anxiolysis: None                 ?Sedation: None. ?DOS: 09/08/2021  ?Performed by: Gaspar Cola, MD ? ?Purpose: Diagnostic/Therapeutic ?Indications: Lumbar radicular pain of intraspinal etiology of more than 4 weeks that has failed to respond to conservative therapy and is severe enough to impact quality of life or function. ?1. Chronic low back pain (2ry area of Pain) (Bilateral) (L>R) w/ sciatica (Bilateral)   ?2. Chronic lower extremity pain (Right)   ?3. DDD (degenerative disc disease), lumbar   ?4. Lumbar radiculitis (L3/L4) (Right)   ?5. Lower extremity radicular pain (Right)   ? ?NAS-11 Pain score:  ? Pre-procedure: 5 /10  ? Post-procedure: 2 /10  ? ?  ? ?Position / Prep / Materials:  ?Position: Prone w/ head of the table raised (slight reverse trendelenburg) to facilitate breathing.  ?Prep solution: DuraPrep (Iodine Povacrylex [0.7% available  iodine] and Isopropyl Alcohol, 74% w/w) ?Prep Area: Entire Posterior Lumbar Region from lower scapular tip down to mid buttocks area and from flank to flank. ?Materials:  ?Tray: Epidural tray ?Needle(s):  ?Type: Epidural needle          ?Gauge (G):  17 ?Length: Regular (3.5-in) ?Qty: 1 ? ?Pre-op H&P Assessment:  ?Elizabeth Bowers is a 56 y.o. (year old), female patient, seen today for interventional treatment. She  has a past surgical history that includes Elbow Debridement; Foot surgery; Cholecystectomy; Colonoscopy; Abdominal hysterectomy; Esophagogastroduodenoscopy (egd) with propofol (N/A, 02/06/2018); Colonoscopy with propofol (N/A, 12/22/2019); and Colonoscopy with propofol (N/A, 12/23/2019). Elizabeth Bowers has a current medication list which includes the following prescription(s): amlodipine, bupropion, duloxetine, fluconazole, fluticasone, ipratropium, lansoprazole, linzess, menaquinone-7, multivitamin with minerals, promethazine, psyllium, triamterene-hydrochlorothiazide, vitamin c, and amoxicillin-clavulanate, and the following Facility-Administered Medications: pentafluoroprop-tetrafluoroeth. Her primarily concern today is the Back Pain (lower) ? ?Initial Vital Signs:  ?Pulse/HCG Rate: 74  ?Temp: (!) 96.7 ?F (35.9 ?C) ?Resp: 16 ?BP: (!) 146/90 ?SpO2: 98 % ? ?BMI: Estimated body mass index is 32.01 kg/m? as calculated from the following: ?  Height as of this encounter: '5\' 2"'$  (1.575 m). ?  Weight as of this encounter: 175 lb (79.4 kg). ? ?Risk Assessment: ?Allergies: Reviewed. She is allergic to gluten meal, lactose, ranitidine, amphetamine-dextroamphetamine, lactose intolerance (gi), reglan [metoclopramide], topiramate er, wheat bran, soap, and sulfa antibiotics.  ?Allergy Precautions: None required ?Coagulopathies: Reviewed. None identified.  ?Blood-thinner therapy: None at this time ?Active Infection(s): Reviewed. None identified. Elizabeth Bowers is afebrile ? ?Site Confirmation: Elizabeth Bowers was asked to confirm the procedure and  laterality before marking the site ?Procedure checklist: Completed ?Consent: Before the procedure  and under the influence of no sedative(s), amnesic(s), or anxiolytics, the patient was informed of the treatment options, risks and possible complications. To fulfill our ethical and legal obligations, as recommended by the American Medical Association's Code of Ethics, I have informed the patient of my clinical impression; the nature and purpose of the treatment or procedure; the risks, benefits, and possible complications of the intervention; the alternatives, including doing nothing; the risk(s) and benefit(s) of the alternative treatment(s) or procedure(s); and the risk(s) and benefit(s) of doing nothing. ?The patient was provided information about the general risks and possible complications associated with the procedure. These may include, but are not limited to: failure to achieve desired goals, infection, bleeding, organ or nerve damage, allergic reactions, paralysis, and death. ?In addition, the patient was informed of those risks and complications associated to Spine-related procedures, such as failure to decrease pain; infection (i.e.: Meningitis, epidural or intraspinal abscess); bleeding (i.e.: epidural hematoma, subarachnoid hemorrhage, or any other type of intraspinal or peri-dural bleeding); organ or nerve damage (i.e.: Any type of peripheral nerve, nerve root, or spinal cord injury) with subsequent damage to sensory, motor, and/or autonomic systems, resulting in permanent pain, numbness, and/or weakness of one or several areas of the body; allergic reactions; (i.e.: anaphylactic reaction); and/or death. ?Furthermore, the patient was informed of those risks and complications associated with the medications. These include, but are not limited to: allergic reactions (i.e.: anaphylactic or anaphylactoid reaction(s)); adrenal axis suppression; blood sugar elevation that in diabetics may result in  ketoacidosis or comma; water retention that in patients with history of congestive heart failure may result in shortness of breath, pulmonary edema, and decompensation with resultant heart failure; weight gain; swelling or edema; medication-induced neural toxicity; particulate matter embolism and blood vessel occlusion with resultant organ, and/or nervous system infarction; and/or aseptic necrosis of one or more joints. ?Finally, the patient was informed that Medicine is not an exact science; therefore, there is also the possibility of unforeseen or unpredictable risks and/or possible complications that may result in a catastrophic outcome. The patient indicated having understood very clearly. We have given the patient no guarantees and we have made no promises. Enough time was given to the patient to ask questions, all of which were answered to the patient's satisfaction. Elizabeth Bowers has indicated that she wanted to continue with the procedure. ?Attestation: I, the ordering provider, attest that I have discussed with the patient the benefits, risks, side-effects, alternatives, likelihood of achieving goals, and potential problems during recovery for the procedure that I have provided informed consent. ?Date  Time: 09/08/2021  8:23 AM ? ?Pre-Procedure Preparation:  ?Monitoring: As per clinic protocol. Respiration, ETCO2, SpO2, BP, heart rate and rhythm monitor placed and checked for adequate function ?Safety Precautions: Patient was assessed for positional comfort and pressure points before starting the procedure. ?Time-out: I initiated and conducted the "Time-out" before starting the procedure, as per protocol. The patient was asked to participate by confirming the accuracy of the "Time Out" information. Verification of the correct person, site, and procedure were performed and confirmed by me, the nursing staff, and the patient. "Time-out" conducted as per Joint Commission's Universal Protocol ([id number].01.01.01). ?Time:  0919 ? ?Description/Narrative of Procedure:          ?Target: Epidural space via interlaminar opening, initially targeting the lower laminar border of the superior vertebral body. ?Region: Lumbar ?Approach: [place]

## 2021-09-09 ENCOUNTER — Telehealth: Payer: Self-pay

## 2021-09-09 NOTE — Telephone Encounter (Signed)
LM for post procedure follow up.    ?

## 2021-09-17 ENCOUNTER — Ambulatory Visit: Payer: 59

## 2021-09-17 ENCOUNTER — Ambulatory Visit
Admission: EM | Admit: 2021-09-17 | Discharge: 2021-09-17 | Disposition: A | Discharge status: Home / Self Care | Payer: 59 | Attending: Physician Assistant | Admitting: Physician Assistant

## 2021-09-17 DIAGNOSIS — J029 Acute pharyngitis, unspecified: Secondary | ICD-10-CM

## 2021-09-17 DIAGNOSIS — H9202 Otalgia, left ear: Secondary | ICD-10-CM | POA: Diagnosis not present

## 2021-09-17 DIAGNOSIS — H6982 Other specified disorders of Eustachian tube, left ear: Secondary | ICD-10-CM | POA: Diagnosis not present

## 2021-09-17 LAB — GROUP A STREP BY PCR: Group A Strep by PCR: NOT DETECTED

## 2021-09-17 NOTE — ED Provider Notes (Signed)
?[place] ? ? ? ?CSN: [id number] ?Arrival date & time: 09/17/21  1050 ? ? ?  ? ?History   ?Chief Complaint ?Chief Complaint  ?Patient presents with  ? Ear Pain  ?  Left ear  ? ? ?HPI ?Elizabeth Bowers is a 56 y.o. female presenting for left ear pain that is constant and worse when she swallows.  She rates it at about 8 out of 10.  She says her throat also hurts on the left side.  Reports associated nasal congestion but says that is chronic.  Reports chronic sinus issues and states she is to have an upcoming sinus surgery in about 10 days.  Patient reports 8 sinus infections in the past couple of months.  Reports the last infection was 2 to 3 weeks ago.  She is taking antibiotics numerous times.  It looks like patient recently completed course of Augmentin on 08/29/2021.  Patient denies taking any medications at all currently for her symptoms.  Reports having Flonase at home but does not use it every day.  She says she does have a Navage that she uses occasionally.  Patient not reporting any current sinus pain.  No other complaints. ? ? ?HPI ? ?Past Medical History:  ?Diagnosis Date  ? Fatty liver   ? Fibromyalgia   ? GERD (gastroesophageal reflux disease)   ? Hemorrhoids   ? Hot flashes   ? Hypertension   ? IBS (irritable bowel syndrome)   ? LUQ abdominal pain 10/25/2016  ? Migraines   ? Miscarriage   ? x 2; 4 live births   ? Multilevel degenerative disc disease   ? Neuroma   ? Vocal cord dysfunction   ? ? ?Patient Active Problem List  ? Diagnosis Date Noted  ? Chronic hip pain (Right) 08/16/2021  ? Impaired range of motion of hip (Right) 08/16/2021  ? Chronic lower extremity pain (Right) 08/16/2021  ? Lower extremity radicular pain (Right) 08/16/2021  ? Lumbar radiculitis (L3/L4) (Right) 08/16/2021  ? Morton's neuroma of foot (Left) 11/15/2020  ? MDD (major depressive disorder) 10/01/2020  ? Anterior tibialis tendinitis, right 04/28/2020  ? Insomnia 07/07/2019  ? Benign essential hypertension 04/22/2019   ? Radial styloid tenosynovitis of left hand 01/13/2019  ? Pain in right hand 01/13/2019  ? Bilateral carpal tunnel syndrome 01/13/2019  ? Chronic pain of left wrist 01/13/2019  ? Chronic low back pain (Bilateral) w/o sciatica 08/08/2018  ? Abnormal MRI, cervical spine (07/10/2018) 07/17/2018  ? Cervical spondylitis with radiculitis (HCC) (C6) (Bilateral) (R>L) 06/26/2018  ? Chronic sacroiliac joint pain (Bilateral) (R>L) 06/26/2018  ? Cervical Grade 1 (2 mm) Retrolisthesis C5 over C6 03/20/2018  ? Cervical foraminal stenosis (C3-4 & C5-6) (Bilateral) 03/20/2018  ? DDD (degenerative disc disease), cervical 03/20/2018  ? Long term prescription benzodiazepine use 03/20/2018  ? Stage 3 chronic kidney disease ([place]) 03/20/2018  ? Acid reflux disease 03/20/2018  ? Spondylosis without myelopathy or radiculopathy, cervical region 03/20/2018  ? Chronic hip pain (Bilateral) (L>R) 03/20/2018  ? Lumbar facet syndrome (Bilateral) (R>L) 03/20/2018  ? Spondylosis without myelopathy or radiculopathy, lumbar region 03/20/2018  ? Chronic Sacroiliac joint dysfunction (Bilateral) (R>L) 03/20/2018  ? Chronic Somatic dysfunction of sacroiliac joint (Bilateral) (R>L) 03/20/2018  ? Osteoarthritis of hip (Bilateral) 03/20/2018  ? Cervicogenic headache (Bilateral) 03/20/2018  ? Cervicalgia (Bilateral) (R>L) 03/20/2018  ? Cervical facet syndrome (Bilateral) (R>L) 03/20/2018  ? Chronic occipital neuralgia (Bilateral) 03/20/2018  ? Chronic upper extremity pain (Fourth Area of Pain) (Bilateral) (R>L)  03/19/2018  ? Ganglion cyst 02/20/2018  ? Chronic low back pain (2ry area of Pain) (Bilateral) (L>R) w/ sciatica (Bilateral) 01/16/2018  ? Chronic lower extremity pain (3ry area of Pain) (Bilateral) (L>R) 01/16/2018  ? Chronic neck pain (1ry area of Pain) (Bilateral) (midline) 01/16/2018  ? Chronic foot pain (Left) 01/16/2018  ? Chronic pain syndrome 01/16/2018  ? Pharmacologic therapy 01/16/2018  ? Disorder of skeletal system 01/16/2018  ? Problems  influencing health status 01/16/2018  ? Obesity 11/21/2017  ? Anxiety 09/28/2017  ? Chronic knee pain (Right) 09/28/2017  ? Fatty liver 07/09/2017  ? Colon polyps 07/09/2017  ? Fibromyalgia 07/09/2017  ? Sinusitis, chronic 07/09/2017  ? Allergic rhinitis 07/09/2017  ? IBS (irritable bowel syndrome) 07/09/2017  ? Eyelid dermatitis, allergic/contact 07/09/2017  ? Superficial thrombophlebitis 01/12/2017  ? Chronic sacroiliac joint pain (Right) 12/16/2016  ? DDD (degenerative disc disease), lumbar 07/18/2016  ? Family hx of colon cancer 07/18/2016  ? ? ?Past Surgical History:  ?Procedure Laterality Date  ? ABDOMINAL HYSTERECTOMY    ? 2012 removed cervix h/o abnormal pap   ? CHOLECYSTECTOMY    ? 2000  ? COLONOSCOPY    ? 2018 with + polpys and GIB 2/2 polyp removal   ? COLONOSCOPY WITH PROPOFOL N/A 12/22/2019  ? Procedure: COLONOSCOPY WITH PROPOFOL;  Surgeon: Lin Landsman, MD;  Location: [facility] ENDOSCOPY;  Service: Gastroenterology;  Laterality: N/A;  ? COLONOSCOPY WITH PROPOFOL N/A 12/23/2019  ? Procedure: COLONOSCOPY WITH PROPOFOL;  Surgeon: Lin Landsman, MD;  Location: [facility] ENDOSCOPY;  Service: Gastroenterology;  Laterality: N/A;  Last name pronounced LEE-MA  ? ELBOW DEBRIDEMENT    ? ESOPHAGOGASTRODUODENOSCOPY (EGD) WITH PROPOFOL N/A 02/06/2018  ? Procedure: ESOPHAGOGASTRODUODENOSCOPY (EGD) WITH PROPOFOL with biopsies;  Surgeon: Lin Landsman, MD;  Location: [facility];  Service: Endoscopy;  Laterality: N/A;  ? FOOT SURGERY    ? ? ?OB History   ?No obstetric history on file. ?  ? ? ? ?Home Medications   ? ?Prior to Admission medications   ?Medication Sig Start Date End Date Taking? Authorizing Provider  ?amLODipine (NORVASC) 2.5 MG tablet Take 1 tablet (2.5 mg total) by mouth daily. 06/09/21 10/05/21 Yes Bacigalupo, [name], MD  ?buPROPion (WELLBUTRIN XL) 300 MG 24 hr tablet Take 1 tablet (300 mg total) by mouth daily. 07/25/21  Yes Bacigalupo, [name], MD  ?DULoxetine (CYMBALTA) 60 MG capsule TAKE  1 CAPSULE BY MOUTH DAILY. 05/21/21 05/21/22 Yes Bacigalupo, [name], MD  ?fluconazole (DIFLUCAN) 150 MG tablet Take medication and if symptoms remain on dose 4- take second dose, by mouth. 07/15/21  Yes Gwyneth Sprout, FNP  ?fluticasone (FLONASE) 50 MCG/ACT nasal spray Place 2 sprays into both nostrils daily. 06/22/21  Yes Covington, Holli Humbles, PA-C  ?lansoprazole (PREVACID) 30 MG capsule TAKE 1 CAPSULE BY MOUTH TWICE DAILY BEFORE A MEAL. 08/10/21 08/10/22 Yes Bacigalupo, [name], MD  ?Rolan Lipa 290 MCG CAPS capsule TAKE 1 CAPSULE BY MOUTH DAILY. 07/14/21 07/14/22 Yes Bacigalupo, [name], MD  ?Multiple Vitamin (MULTIVITAMIN WITH MINERALS) TABS tablet Take 1 tablet by mouth daily.   Yes [provider]  ?promethazine (PHENERGAN) 25 MG suppository Place 1 suppository (25 mg total) rectally every 6 (six) hours as needed for nausea or vomiting. 05/05/21  Yes Rodriguez-Southworth, Sunday Spillers, PA-C  ?psyllium (METAMUCIL) 58.6 % powder Take 2 packets by mouth daily.   Yes [provider]  ?triamterene-hydrochlorothiazide (MAXZIDE-25) 37.5-25 MG tablet TAKE 1 TABLET BY MOUTH DAILY. 06/09/21 06/09/22 Yes Bacigalupo, [name], MD  ?  vitamin C (ASCORBIC ACID) 500 MG tablet Take 500 mg by mouth daily.   Yes [provider]  ?ipratropium (ATROVENT) 0.06 % nasal spray Place 2 sprays into both nostrils 4 (four) times daily. 08/22/21   Margarette Canada, NP  ?Menaquinone-7 (VITAMIN K2 PO) Take by mouth.    [provider]  ? ? ?Family History ?Family History  ?Problem Relation Age of Onset  ? Hypertension Mother   ? Hyperlipidemia Mother   ? Glaucoma Mother   ? Diabetes Mother   ?     type 2   ? COPD Father   ?     emphysema  ? Alcohol abuse Father   ? Colon cancer Sister   ? Cancer Sister   ?     rectal/colon cancer no h/o IBD  ? Colon polyps Sister   ? Asthma Sister   ? COPD Sister   ? Kidney disease Sister   ? Mental illness Sister   ?     bipolar   ? Miscarriages / Stillbirths Sister   ? Uterine cancer Sister   ?  Alcohol abuse Son   ? Depression Son   ? Diabetes Son   ? Drug abuse Son   ? Mental illness Son   ?     bipolar   ? Diabetes Maternal Grandmother   ? COPD Paternal Grandmother   ? Cancer Paternal Grandfathe

## 2021-09-17 NOTE — Discharge Instructions (Signed)
-  The strep test is negative. ?- Your ear pain is likely caused by eustachian tube dysfunction secondary to your chronic sinus issues. ?- You should be taking a decongestant and using a nasal spray and consider using a nasal lavage to help.  You can also take ibuprofen and Tylenol for pain relief as long as there is no contraindication. ?- Keep your follow-up appointment with your ENT specialist.  Try to see them sooner if your ear is not improving after you try the Allegra-D which you can purchase at the pharmacy counter.  You have to ask for it specifically.  Also start Flonase daily and nasal saline. ?

## 2021-09-17 NOTE — ED Triage Notes (Signed)
Pt c/o left ear pain that is going down to the left side of her throat x1day ?

## 2021-09-19 ENCOUNTER — Ambulatory Visit: Payer: 59 | Admitting: Pain Medicine

## 2021-09-19 ENCOUNTER — Encounter: Payer: Self-pay | Admitting: Family Medicine

## 2021-09-21 ENCOUNTER — Encounter: Payer: Self-pay | Admitting: Otolaryngology

## 2021-09-23 ENCOUNTER — Ambulatory Visit (INDEPENDENT_AMBULATORY_CARE_PROVIDER_SITE_OTHER): Payer: 59 | Admitting: Family Medicine

## 2021-09-23 ENCOUNTER — Other Ambulatory Visit (HOSPITAL_COMMUNITY): Payer: Self-pay

## 2021-09-23 ENCOUNTER — Encounter: Payer: Self-pay | Admitting: Family Medicine

## 2021-09-23 VITALS — BP 154/87 | HR 80 | Temp 98.6°F | Wt 175.0 lb

## 2021-09-23 DIAGNOSIS — Z6832 Body mass index (BMI) 32.0-32.9, adult: Secondary | ICD-10-CM | POA: Diagnosis not present

## 2021-09-23 DIAGNOSIS — I10 Essential (primary) hypertension: Secondary | ICD-10-CM | POA: Diagnosis not present

## 2021-09-23 DIAGNOSIS — N1831 Chronic kidney disease, stage 3a: Secondary | ICD-10-CM | POA: Diagnosis not present

## 2021-09-23 DIAGNOSIS — E669 Obesity, unspecified: Secondary | ICD-10-CM | POA: Diagnosis not present

## 2021-09-23 MED ORDER — AMLODIPINE BESYLATE 5 MG PO TABS
5.0000 mg | ORAL_TABLET | Freq: Every day | ORAL | 1 refills | Status: DC
  Filled 2021-09-23: qty 90, 90d supply, fill #0
  Filled 2021-12-05: qty 90, 90d supply, fill #1

## 2021-09-23 NOTE — Assessment & Plan Note (Signed)
Chronic and uncontrolled in the office and at home ?Upcoming surgery, so need to get it down more quickly ?Continue triamterene HCTZ at current dose ?Increase amlodipine to 5 mg daily ?If her blood pressure remains elevated early next week, she can increase to 10 mg daily of amlodipine prior to her surgery ?Recheck metabolic panel ?

## 2021-09-23 NOTE — Assessment & Plan Note (Signed)
Discussed importance of healthy weight management Discussed diet and exercise  

## 2021-09-23 NOTE — Progress Notes (Signed)
? ?I,Elena D DeSanto,acting as a scribe for Lavon Paganini, MD.,have documented all relevant documentation on the behalf of Lavon Paganini, MD,as directed by  Lavon Paganini, MD while in the presence of Lavon Paganini, MD. ?  ? ? ?Established patient visit ? ? ?Patient: Elizabeth Bowers   DOB: [birth year]   56 y.o. Female  MRN: [id number] ?Visit Date: 09/23/2021 ? ?Today's healthcare provider: Lavon Paganini, MD  ? ?Chief Complaint  ?Patient presents with  ? Hypertension  ? ?Subjective  ?  ?HPI  ?Hypertension, follow-up ? ?BP Readings from Last 3 Encounters:  ?09/23/21 (!) 154/87  ?09/17/21 (!) 158/102  ?09/08/21 (!) 148/82  ? Wt Readings from Last 3 Encounters:  ?09/23/21 175 lb (79.4 kg)  ?09/17/21 170 lb (77.1 kg)  ?09/08/21 175 lb (79.4 kg)  ?  ? ?She was last seen for hypertension 5 months ago.  ?BP at that visit was 144/91. Management since that visit includes none. ? ?She reports good compliance with treatment. ?She is not having side effects.  ? ?Use of agents associated with hypertension: none.  ? ?Outside blood pressures are being checked in other offices and once while at work.  Her readings have been high and the last time she checked it was 160/92.  She is scheduled for sinus surgery in a few weeks and needs to get better control or she fears they will not do the surgery.   ? ?Symptoms: ?No chest pain No chest pressure  ?No palpitations No syncope  ?Yes dyspnea No orthopnea  ?No paroxysmal nocturnal dyspnea No lower extremity edema  ?She reports DOE increasing, with increased fatigue and worsening sleep.  No headaches of visual disturbances. ? ? ?Pertinent labs ?Lab Results  ?Component Value Date  ? CHOL 120 04/05/2021  ? HDL 50 04/05/2021  ? Carpio 57 04/05/2021  ? TRIG 57 04/05/2021  ? CHOLHDL 2.4 04/05/2021  ? Lab Results  ?Component Value Date  ? NA 135 05/05/2021  ? K 3.6 05/05/2021  ? CREATININE 1.14 (H) 05/05/2021  ? GFRNONAA 56 (L) 05/05/2021  ? GLUCOSE 105 (H) 05/05/2021  ?  ? ?The  ASCVD Risk score (Arnett DK, et al., 2019) failed to calculate for the following reasons: ?  The valid total cholesterol range is 130 to 320 mg/dL ? ?--------------------------------------------------------------------------------------------------- ? ? ?Medications: ?Outpatient Medications Prior to Visit  ?Medication Sig  ? buPROPion (WELLBUTRIN XL) 300 MG 24 hr tablet Take 1 tablet (300 mg total) by mouth daily.  ? DULoxetine (CYMBALTA) 60 MG capsule TAKE 1 CAPSULE BY MOUTH DAILY.  ? fluticasone (FLONASE) 50 MCG/ACT nasal spray Place 2 sprays into both nostrils daily.  ? lansoprazole (PREVACID) 30 MG capsule TAKE 1 CAPSULE BY MOUTH TWICE DAILY BEFORE A MEAL.  ? LINZESS 290 MCG CAPS capsule TAKE 1 CAPSULE BY MOUTH DAILY.  ? Multiple Vitamin (MULTIVITAMIN WITH MINERALS) TABS tablet Take 1 tablet by mouth daily.  ? promethazine (PHENERGAN) 25 MG suppository Place 1 suppository (25 mg total) rectally every 6 (six) hours as needed for nausea or vomiting.  ? psyllium (METAMUCIL) 58.6 % powder Take 2 packets by mouth daily.  ? triamterene-hydrochlorothiazide (MAXZIDE-25) 37.5-25 MG tablet TAKE 1 TABLET BY MOUTH DAILY.  ? vitamin C (ASCORBIC ACID) 500 MG tablet Take 500 mg by mouth daily.  ? [DISCONTINUED] amLODipine (NORVASC) 2.5 MG tablet Take 1 tablet (2.5 mg total) by mouth daily.  ? [DISCONTINUED] fluconazole (DIFLUCAN) 150 MG tablet Take medication and if symptoms remain on dose 4- take second dose, by  mouth. (Patient not taking: Reported on 09/21/2021)  ? [DISCONTINUED] ipratropium (ATROVENT) 0.06 % nasal spray Place 2 sprays into both nostrils 4 (four) times daily. (Patient not taking: Reported on 09/21/2021)  ? ?No facility-administered medications prior to visit.  ? ? ?Review of Systems per HPI ? ? ?  Objective  ?  ?BP (!) 154/87 (BP Location: Left Arm, Patient Position: Sitting, Cuff Size: Normal)   Pulse 80   Temp 98.6 ?F (37 ?C) (Oral)   Wt 175 lb (79.4 kg)   SpO2 97%   BMI 32.01 kg/m?  ? ? ?Physical  Exam ?Vitals reviewed.  ?Constitutional:   ?   General: She is not in acute distress. ?   Appearance: Normal appearance. She is well-developed. She is not diaphoretic.  ?HENT:  ?   Head: Normocephalic and atraumatic.  ?Eyes:  ?   General: No scleral icterus. ?   Conjunctiva/sclera: Conjunctivae normal.  ?Neck:  ?   Thyroid: No thyromegaly.  ?Cardiovascular:  ?   Rate and Rhythm: Normal rate and regular rhythm.  ?   Pulses: Normal pulses.  ?   Heart sounds: Normal heart sounds. No murmur heard. ?Pulmonary:  ?   Effort: Pulmonary effort is normal. No respiratory distress.  ?   Breath sounds: Normal breath sounds. No wheezing, rhonchi or rales.  ?Musculoskeletal:  ?   Cervical back: Neck supple.  ?   Right lower leg: No edema.  ?   Left lower leg: No edema.  ?Lymphadenopathy:  ?   Cervical: No cervical adenopathy.  ?Skin: ?   General: Skin is warm and dry.  ?Neurological:  ?   Mental Status: She is alert and oriented to person, place, and time. Mental status is at baseline.  ?Psychiatric:     ?   Mood and Affect: Mood normal.     ?   Behavior: Behavior normal.  ?  ? ? ?No results found for any visits on 09/23/21. ? Assessment & Plan  ?  ? ?Problem List Items Addressed This Visit   ? ?  ? Cardiovascular and Mediastinum  ? Benign essential hypertension - Primary  ?  Chronic and uncontrolled in the office and at home ?Upcoming surgery, so need to get it down more quickly ?Continue triamterene HCTZ at current dose ?Increase amlodipine to 5 mg daily ?If her blood pressure remains elevated early next week, she can increase to 10 mg daily of amlodipine prior to her surgery ?Recheck metabolic panel ? ?  ?  ? Relevant Medications  ? amLODipine (NORVASC) 5 MG tablet  ? Other Relevant Orders  ? Comprehensive metabolic panel  ? CBC  ?  ? Genitourinary  ? Stage 3 chronic kidney disease ([place])  ?  Chronic and stable ?Recheck metabolic panel ?Avoid nephrotoxic meds ? ?  ?  ?  ? Other  ? Class 1 obesity with serious comorbidity and  body mass index (BMI) of 32.0 to 32.9 in adult  ?  Discussed importance of healthy weight management ?Discussed diet and exercise ? ?  ?  ?  ? ?Return in about 2 weeks (around 10/07/2021) for CPE, as scheduled.  ?   ? ?I, Lavon Paganini, MD, have reviewed all documentation for this visit. The documentation on 09/23/21 for the exam, diagnosis, procedures, and orders are all accurate and complete. ? ? ?Virginia Crews, MD, MPH ?[place] ?[facility]   ?

## 2021-09-23 NOTE — Assessment & Plan Note (Signed)
Chronic and stable Recheck metabolic panel Avoid nephrotoxic meds  

## 2021-09-24 LAB — COMPREHENSIVE METABOLIC PANEL
ALT: 38 IU/L — ABNORMAL HIGH (ref 0–32)
AST: 27 IU/L (ref 0–40)
Albumin/Globulin Ratio: 2 (ref 1.2–2.2)
Albumin: 4.5 g/dL (ref 3.8–4.9)
Alkaline Phosphatase: 101 IU/L (ref 44–121)
BUN/Creatinine Ratio: 8 — ABNORMAL LOW (ref 9–23)
BUN: 10 mg/dL (ref 6–24)
Bilirubin Total: 0.5 mg/dL (ref 0.0–1.2)
CO2: 25 mmol/L (ref 20–29)
Calcium: 9.8 mg/dL (ref 8.7–10.2)
Chloride: 98 mmol/L (ref 96–106)
Creatinine, Ser: 1.21 mg/dL — ABNORMAL HIGH (ref 0.57–1.00)
Globulin, Total: 2.3 g/dL (ref 1.5–4.5)
Glucose: 97 mg/dL (ref 70–99)
Potassium: 3.8 mmol/L (ref 3.5–5.2)
Sodium: 139 mmol/L (ref 134–144)
Total Protein: 6.8 g/dL (ref 6.0–8.5)
eGFR: 52 mL/min/{1.73_m2} — ABNORMAL LOW (ref >59)

## 2021-09-24 LAB — CBC
Hematocrit: 44.3 % (ref 34.0–46.6)
Hemoglobin: 15 g/dL (ref 11.1–15.9)
MCH: 30.9 pg (ref 26.6–33.0)
MCHC: 33.9 g/dL (ref 31.5–35.7)
MCV: 91 fL (ref 79–97)
Platelets: 301 10*3/uL (ref 150–450)
RBC: 4.86 x10E6/uL (ref 3.77–5.28)
RDW: 12.6 % (ref 11.7–15.4)
WBC: 7.3 10*3/uL (ref 3.4–10.8)

## 2021-09-26 ENCOUNTER — Encounter: Payer: 59 | Admitting: Family Medicine

## 2021-09-26 NOTE — Discharge Instructions (Signed)
?[place] ?[place] ?ENDOSCOPIC SINUS SURGERY ?[facility] ? ?What is Functional Endoscopic Sinus Surgery? ? The Surgery involves making the natural openings of the sinuses larger by removing the bony partitions that separate the sinuses from the nasal cavity.  The natural sinus lining is preserved as much as possible to allow the sinuses to resume normal function after the surgery.  In some patients nasal polyps (excessively swollen lining of the sinuses) may be removed to relieve obstruction of the sinus openings.  The surgery is performed through the nose using lighted scopes, which eliminates the need for incisions on the face.  A septoplasty is a different procedure which is sometimes performed with sinus surgery.  It involves straightening the boy partition that separates the two sides of your nose.  A crooked or deviated septum may need repair if is obstructing the sinuses or nasal airflow.  Turbinate reduction is also often performed during sinus surgery.  The turbinates are bony proturberances from the side walls of the nose which swell and can obstruct the nose in patients with sinus and allergy problems.  Their size can be surgically reduced to help relieve nasal obstruction. ? ?What Can Sinus Surgery Do For Me? ? Sinus surgery can reduce the frequency of sinus infections requiring antibiotic treatment.  This can provide improvement in nasal congestion, post-nasal drainage, facial pressure and nasal obstruction.  Surgery will NOT prevent you from ever having an infection again, so it usually only for patients who get infections 4 or more times yearly requiring antibiotics, or for infections that do not clear with antibiotics.  It will not cure nasal allergies, so patients with allergies may still require medication to treat their allergies after surgery. Surgery may improve headaches related to sinusitis, however, some people will continue to  require medication to control sinus headaches related to allergies.  Surgery will do nothing for other forms of headache (migraine, tension or cluster). ? ?What Are the Risks of Endoscopic Sinus Surgery? ? Current techniques allow surgery to be performed safely with little risk, however, there are rare complications that patients should be aware of.  Because the sinuses are located around the eyes, there is risk of eye injury, including blindness, though again, this would be quite rare. This is usually a result of bleeding behind the eye during surgery, which can effect vision, though there are treatments to protect the vision and prevent permanent injury. More serious complications would include bleeding inside the brain cavity or damage to the brain.This happens when the fluid around the brain leaks out into the sinus cavity.  Again, all of these complications are uncommon, and spinal fluid leaks can be safely managed surgically if they occur.  The most common complication of sinus surgery is bleeding from the nose, which may require packing or cauterization of the nose.  Patients with polyps may experience recurrence of the polyps that would require revision surgery.  Alterations of sense of smell or injury to the tear ducts are also rare complications.  ? ?What is the Surgery Like, and what is the Recovery? ? The Surgery usually takes a couple of hours to perform, and is usually performed under a general anesthetic (completely asleep).  Patients are usually discharged home after a couple of hours.  Sometimes during surgery it is necessary to pack the nose to control bleeding, and the packing is left in place for 24 - 48 hours, and removed by your surgeon.  If  a septoplasty was performed during the procedure, there is often a splint placed which must be removed after 5-7 days.   ?Discomfort: Pain is usually mild to moderate, and can be controlled by prescription pain medication or acetaminophen (Tylenol).   Aspirin, Ibuprofen (Advil, Motrin), or Naprosyn (Aleve) should be avoided, as they can cause increased bleeding.  Most patients feel sinus pressure like they have a bad head cold for several days.  Sleeping with your head elevated can help reduce swelling and facial pressure, as can ice packs over the face.  A humidifier may be helpful to keep the mucous and blood from drying in the nose.  ? ?Diet: There are no specific diet restrictions, however, you should generally start with clear liquids and a light diet of bland foods because the anesthetic can cause some nausea.  Advance your diet depending on how your stomach feels.  Taking your pain medication with food will often help reduce stomach upset which pain medications can cause. ? ?Nasal Saline Irrigation: It is important to remove blood clots and dried mucous from the nose as it is healing.  This is done by having you irrigate the nose at least 3 - 4 times daily with a salt water solution.  We recommend using NeilMed Sinus Rinse (available at the drug store).  Fill the squeeze bottle with the solution, bend over a sink, and insert the tip of the squeeze bottle into the nose ? of an inch.  Point the tip of the squeeze bottle towards the inside corner of the eye on the same side your irrigating.  Squeeze the bottle and gently irrigate the nose.  If you bend forward as you do this, most of the fluid will flow back out of the nose, instead of down your throat.   The solution should be warm, near body temperature, when you irrigate.   Each time you irrigate, you should use a full squeeze bottle.  ? ?Note that if you are instructed to use Nasal Steroid Sprays at any time after your surgery, irrigate with saline BEFORE using the steroid spray, so you do not wash it all out of the nose. ?Another product, Nasal Saline Gel (such as AYR Nasal Saline Gel) can be applied in each nostril 3 - 4 times daily to moisture the nose and reduce scabbing or crusting. ? ?Bleeding:   Bloody drainage from the nose can be expected for several days, and patients are instructed to irrigate their nose frequently with salt water to help remove mucous and blood clots.  The drainage may be dark red or brown, though some fresh blood may be seen intermittently, especially after irrigation.  Do not blow you nose, as bleeding may occur. If you must sneeze, keep your mouth open to allow air to escape through your mouth. ? ?If heavy bleeding occurs: Irrigate the nose with saline to rinse out clots, then spray the nose 3 - 4 times with Afrin Nasal Decongestant Spray.  The spray will constrict the blood vessels to slow bleeding.  Pinch the lower half of your nose shut to apply pressure, and lay down with your head elevated.  Ice packs over the nose may help as well. If bleeding persists despite these measures, you should notify your doctor.  Do not use the Afrin routinely to control nasal congestion after surgery, as it can result in worsening congestion and may affect healing.  ? ? ? ?Activity: Return to work varies among patients. Most patients will be out  of work at least 5 - 7 days to recover.  Patient may return to work after they are off of narcotic pain medication, and feeling well enough to perform the functions of their job.  Patients must avoid heavy lifting (over 10 pounds) or strenuous physical for 2 weeks after surgery, so your employer may need to assign you to light duty, or keep you out of work longer if light duty is not possible.  NOTE: you should not drive, operate dangerous machinery, do any mentally demanding tasks or make any important legal or financial decisions while on narcotic pain medication and recovering from the general anesthetic.  ?  ?Call Your Doctor Immediately if You Have Any of the Following: ?Bleeding that you cannot control with the above measures ?Loss of vision, double vision, bulging of the eye or black eyes. ?Fever over 101 degrees ?Neck stiffness with severe headache,  fever, nausea and change in mental state. ?You are always encouraged to call anytime with concerns, however, please call with requests for pain medication refills during office hours. ? ?Office Endoscopy: Otilio Miu

## 2021-09-27 ENCOUNTER — Encounter: Payer: Self-pay | Admitting: Family Medicine

## 2021-09-28 ENCOUNTER — Ambulatory Visit
Admission: RE | Admit: 2021-09-28 | Discharge: 2021-09-28 | Disposition: A | Discharge status: Home / Self Care | Payer: 59 | Attending: Otolaryngology | Admitting: Otolaryngology

## 2021-09-28 ENCOUNTER — Other Ambulatory Visit: Payer: Self-pay

## 2021-09-28 ENCOUNTER — Ambulatory Visit: Payer: 59 | Admitting: Anesthesiology

## 2021-09-28 ENCOUNTER — Encounter
Admission: RE | Disposition: A | Discharge status: Home / Self Care | Payer: Self-pay | Source: Home / Self Care | Attending: Otolaryngology

## 2021-09-28 ENCOUNTER — Encounter: Payer: Self-pay | Admitting: Otolaryngology

## 2021-09-28 DIAGNOSIS — J329 Chronic sinusitis, unspecified: Secondary | ICD-10-CM | POA: Insufficient documentation

## 2021-09-28 DIAGNOSIS — J3489 Other specified disorders of nose and nasal sinuses: Secondary | ICD-10-CM | POA: Diagnosis not present

## 2021-09-28 DIAGNOSIS — J322 Chronic ethmoidal sinusitis: Secondary | ICD-10-CM | POA: Diagnosis not present

## 2021-09-28 DIAGNOSIS — I10 Essential (primary) hypertension: Secondary | ICD-10-CM | POA: Diagnosis not present

## 2021-09-28 DIAGNOSIS — J320 Chronic maxillary sinusitis: Secondary | ICD-10-CM | POA: Diagnosis not present

## 2021-09-28 DIAGNOSIS — K219 Gastro-esophageal reflux disease without esophagitis: Secondary | ICD-10-CM | POA: Diagnosis not present

## 2021-09-28 DIAGNOSIS — J323 Chronic sphenoidal sinusitis: Secondary | ICD-10-CM | POA: Diagnosis not present

## 2021-09-28 DIAGNOSIS — F418 Other specified anxiety disorders: Secondary | ICD-10-CM | POA: Diagnosis not present

## 2021-09-28 DIAGNOSIS — J324 Chronic pansinusitis: Secondary | ICD-10-CM | POA: Diagnosis not present

## 2021-09-28 HISTORY — PX: SPHENOIDECTOMY: SHX2421

## 2021-09-28 HISTORY — PX: IMAGE GUIDED SINUS SURGERY: SHX6570

## 2021-09-28 HISTORY — PX: MAXILLARY ANTROSTOMY: SHX2003

## 2021-09-28 HISTORY — PX: ETHMOIDECTOMY: SHX5197

## 2021-09-28 SURGERY — SINUS SURGERY, WITH IMAGING GUIDANCE
Anesthesia: General | Site: Nose

## 2021-09-28 MED ORDER — AMOXICILLIN-POT CLAVULANATE 875-125 MG PO TABS
1.0000 | ORAL_TABLET | Freq: Two times a day (BID) | ORAL | 0 refills | Status: DC
  Filled 2021-09-28: qty 20, 10d supply, fill #0

## 2021-09-28 MED ORDER — HYDROMORPHONE HCL 1 MG/ML IJ SOLN: 0.2500 mg | INTRAMUSCULAR | Status: DC | PRN

## 2021-09-28 MED ORDER — ACETAMINOPHEN 160 MG/5ML PO SOLN: 325.0000 mg | ORAL | Status: DC | PRN

## 2021-09-28 MED ORDER — ONDANSETRON HCL 4 MG/2ML IJ SOLN: 4.0000 mg | Freq: Once | INTRAMUSCULAR | Status: DC | PRN

## 2021-09-28 MED ORDER — DEXAMETHASONE SODIUM PHOSPHATE 4 MG/ML IJ SOLN
INTRAMUSCULAR | Status: DC | PRN
  Administered 2021-09-28: 10 mg via INTRAVENOUS

## 2021-09-28 MED ORDER — OXYCODONE-ACETAMINOPHEN 5-325 MG PO TABS
1.0000 | ORAL_TABLET | Freq: Four times a day (QID) | ORAL | 0 refills | Status: AC | PRN
  Filled 2021-09-28: qty 40, 5d supply, fill #0

## 2021-09-28 MED ORDER — SUCCINYLCHOLINE CHLORIDE 200 MG/10ML IV SOSY
PREFILLED_SYRINGE | INTRAVENOUS | Status: DC | PRN
  Administered 2021-09-28: 100 mg via INTRAVENOUS

## 2021-09-28 MED ORDER — PROPOFOL 10 MG/ML IV BOLUS
INTRAVENOUS | Status: DC | PRN
Start: 2021-09-28 — End: 2021-09-28
  Administered 2021-09-28: 130 mg via INTRAVENOUS

## 2021-09-28 MED ORDER — GLYCOPYRROLATE 0.2 MG/ML IJ SOLN
INTRAMUSCULAR | Status: DC | PRN
Start: 2021-09-28 — End: 2021-09-28
  Administered 2021-09-28: .1 mg via INTRAVENOUS

## 2021-09-28 MED ORDER — LABETALOL HCL 5 MG/ML IV SOLN
INTRAVENOUS | Status: DC | PRN
  Administered 2021-09-28 (×2): 5 mg via INTRAVENOUS

## 2021-09-28 MED ORDER — ONDANSETRON HCL 4 MG/2ML IJ SOLN
INTRAMUSCULAR | Status: DC | PRN
  Administered 2021-09-28: 4 mg via INTRAVENOUS

## 2021-09-28 MED ORDER — ONDANSETRON HCL 4 MG PO TABS
4.0000 mg | ORAL_TABLET | Freq: Three times a day (TID) | ORAL | 0 refills | Status: DC | PRN
  Filled 2021-09-28: qty 20, 7d supply, fill #0

## 2021-09-28 MED ORDER — LIDOCAINE HCL (CARDIAC) PF 100 MG/5ML IV SOSY
PREFILLED_SYRINGE | INTRAVENOUS | Status: DC | PRN
  Administered 2021-09-28: 50 mg via INTRAVENOUS

## 2021-09-28 MED ORDER — MIDAZOLAM HCL 5 MG/5ML IJ SOLN
INTRAMUSCULAR | Status: DC | PRN
Start: 2021-09-28 — End: 2021-09-28
  Administered 2021-09-28: 2 mg via INTRAVENOUS

## 2021-09-28 MED ORDER — OXYMETAZOLINE HCL 0.05 % NA SOLN
NASAL | Status: DC | PRN
  Administered 2021-09-28: 1 via TOPICAL

## 2021-09-28 MED ORDER — LACTATED RINGERS IV SOLN: INTRAVENOUS | Status: DC

## 2021-09-28 MED ORDER — ACETAMINOPHEN 10 MG/ML IV SOLN
INTRAVENOUS | Status: DC | PRN
  Administered 2021-09-28: 1000 mg via INTRAVENOUS

## 2021-09-28 MED ORDER — EPHEDRINE SULFATE (PRESSORS) 50 MG/ML IJ SOLN
INTRAMUSCULAR | Status: DC | PRN
  Administered 2021-09-28 (×2): 10 mg via INTRAVENOUS

## 2021-09-28 MED ORDER — LIDOCAINE-EPINEPHRINE 1 %-1:100000 IJ SOLN
INTRAMUSCULAR | Status: DC | PRN
  Administered 2021-09-28: 5.5 mL

## 2021-09-28 MED ORDER — FENTANYL CITRATE (PF) 100 MCG/2ML IJ SOLN
INTRAMUSCULAR | Status: DC | PRN
Start: 2021-09-28 — End: 2021-09-28
  Administered 2021-09-28 (×3): 50 ug via INTRAVENOUS

## 2021-09-28 MED ORDER — OXYCODONE HCL 5 MG/5ML PO SOLN
5.0000 mg | Freq: Once | ORAL | Status: AC | PRN
  Administered 2021-09-28: 5 mg via ORAL

## 2021-09-28 MED ORDER — OXYCODONE HCL 5 MG PO TABS: 5.0000 mg | ORAL_TABLET | Freq: Once | ORAL | Status: AC | PRN

## 2021-09-28 MED ORDER — ACETAMINOPHEN 325 MG PO TABS: 325.0000 mg | ORAL_TABLET | ORAL | Status: DC | PRN

## 2021-09-28 SURGICAL SUPPLY — 29 items
BLADE SHAVER TRUDI STR 4 (ENT DISPOSABLE) ×1 IMPLANT
CABLE TRUDI DISPOSABLE (ENT DISPOSABLE) ×6 IMPLANT
CANISTER SUCT 1200ML W/VALVE (MISCELLANEOUS) ×3 IMPLANT
COAGULATOR SUCT 8FR VV (MISCELLANEOUS) ×1 IMPLANT
DEVICE INFLATION SEID (MISCELLANEOUS) ×1 IMPLANT
DRESSING NASL FOAM PST OP SINU (MISCELLANEOUS) IMPLANT
DRSG NASAL FOAM POST OP SINU (MISCELLANEOUS) ×3
ELECT REM PT RETURN 9FT ADLT (ELECTROSURGICAL) ×3
ELECTRODE REM PT RTRN 9FT ADLT (ELECTROSURGICAL) ×2 IMPLANT
GLOVE SURG GAMMEX PI TX LF 7.5 (GLOVE) ×6 IMPLANT
GOWN STRL REUS W/ TWL LRG LVL3 (GOWN DISPOSABLE) ×2 IMPLANT
GOWN STRL REUS W/TWL LRG LVL3 (GOWN DISPOSABLE) ×3
IV NS 500ML (IV SOLUTION) ×6
IV NS 500ML BAXH (IV SOLUTION) ×2 IMPLANT
KIT TURNOVER KIT A (KITS) ×3 IMPLANT
NS IRRIG 500ML POUR BTL (IV SOLUTION) ×3 IMPLANT
PACK ENT CUSTOM (PACKS) ×3 IMPLANT
PACKING NASAL EPIS 4X2.4 XEROG (MISCELLANEOUS) ×2 IMPLANT
PATTIES SURGICAL .5 X3 (DISPOSABLE) ×3 IMPLANT
SOL ANTI-FOG 6CC FOG-OUT (MISCELLANEOUS) ×2 IMPLANT
SOL FOG-OUT ANTI-FOG 6CC (MISCELLANEOUS) ×1
STRAP BODY AND KNEE 60X3 (MISCELLANEOUS) ×3 IMPLANT
SYR 10ML LL (SYRINGE) ×3 IMPLANT
SYR EAR/ULCER 2OZ (SYRINGE) ×3 IMPLANT
SYSTEM BALLOON SINUPLASTY 6X16 (SINUPLASTY) ×1 IMPLANT
TRACKER DISPOSABLE PAITIENT (MISCELLANEOUS) ×3 IMPLANT
TUBING CONNECTING 10 (TUBING) ×1 IMPLANT
TUBING IRRIGATION BIEN-AIR (TUBING) ×3 IMPLANT
WATER STERILE IRR 250ML POUR (IV SOLUTION) ×1 IMPLANT

## 2021-09-28 NOTE — Anesthesia Procedure Notes (Addendum)
Procedure Name: Intubation ?Date/Time: 09/28/2021 7:41 AM ?Performed by: Mayme Genta, CRNA ?Pre-anesthesia Checklist: Patient identified, Emergency Drugs available, Suction available, Patient being monitored and Timeout performed ?Patient Re-evaluated:Patient Re-evaluated prior to induction ?Oxygen Delivery Method: Circle system utilized ?Preoxygenation: Pre-oxygenation with 100% oxygen ?Induction Type: IV induction ?Ventilation: Mask ventilation without difficulty ?Laryngoscope Size: Sabra Heck and 2 ?Grade View: Grade I ?Tube type: Oral Dwyane Luo ?Tube size: 7.0 mm ?Number of attempts: 1 ?Airway Equipment and Method: Bougie stylet ?Placement Confirmation: ETT inserted through vocal cords under direct vision, positive ETCO2 and breath sounds checked- equal and bilateral ?Tube secured with: Tape ?Dental Injury: Teeth and Oropharynx as per pre-operative assessment  ?Comments: Unable to pass ETT with a grade I view. Bougie inserted without difficulty and ETT passed over. +/= BBS. +EtCO2. ? ? ? ? ?

## 2021-09-28 NOTE — H&P (Signed)
..  History and Physical paper copy reviewed and updated date of procedure and will be scanned into system.  Patient seen and examined.  

## 2021-09-28 NOTE — Anesthesia Postprocedure Evaluation (Signed)
Anesthesia Post Note ? ?Patient: Florenda Watt ? ?Procedure(s) Performed: IMAGE GUIDED SINUS SURGERY (Nose) ?MAXILLARY ANTROSTOMY WITHOUT TISSUE REMOVAL (Bilateral: Nose) ?TOTAL ETHMOIDECTOMY (Nose) ?SPHENOIDOTOMY (Bilateral: Nose) ? ? ?  ?Patient location during evaluation: PACU ?Anesthesia Type: General ?Level of consciousness: awake and alert ?Pain management: pain level controlled ?Vital Signs Assessment: post-procedure vital signs reviewed and stable ?Respiratory status: spontaneous breathing, nonlabored ventilation, respiratory function stable and patient connected to nasal cannula oxygen ?Cardiovascular status: blood pressure returned to baseline and stable ?Postop Assessment: no apparent nausea or vomiting ?Anesthetic complications: no ? ? ?No notable events documented. ? ?Trecia Rogers ? ? ? ? ? ?

## 2021-09-28 NOTE — Op Note (Signed)
..09/28/2021 ? ?9:14 AM ? ? ? ?Knox Maribelle Hopple ? ?[id number] ? ? ?Pre-Op Dx:  Chronic Rhinosinusitis refractory to medical treatment ? ?Post-op Dx: Same ? ?Proc:   1)  Image Guided Sinus Surgery, ? 2)  Bilateral Maxillary Antrostomy ? 3)  Bilateral total ethmoidectomy ? 4)  Bilateral Sphenoidotomy ?  ? ?Surg:  Jeannie Fend Michiel Sivley ? ?Anes:  General ? ?EBL:  75 ? ?Comp:  None ? ?Findings: Narrow nasal cavity.  Successful bilateral maxillary antrostomy, bilateral total ethmoid, and bilateral sphenoidotomy ? ?Procedure: After the patient was identified in holding and the benefits of the procedure were reviewed as well as the consent and risks.  The patient was taken to the operating room and with the patient in a comfortable supine position,  general orotracheal anesthesia was induced without difficulty.  A proper time-out was performed.  The Acclarent image guidance system was set up and calibrated in the normal fashion.    ? ?The patient next received preoperative Afrin spray for topical decongestion and vasoconstriction and 1% Xylocaine with 1:100,000 epinephrine, 5 cc's, was infiltrated into the inferior turbinates, septum, and anterior middle turbinates bilaterally.  Several minutes were allowed for this to take effect.  Cottoniod pledgets soaked in Afrin were placed into both nasal cavities and left while the patient was prepped and draped in the standard fashion. ? ?The materials were removed from the nose and observed to be intact and correct in number.  The nose was next inspected with a zero degree endoscope and the middle turbinates were medialized and afrin soaked pledgets were placed lateral to the turbinates for approximately one minute. ? ?At this time attention was directed to the patient's maxillary sinuses.  On the left, a ball tipped probe was placed through the natural ostia and this was used to create a larger opening.  Using a microdebrider, the maxillary antrostomy was enlarged for a widely patent  maxillary antrostomy.  This was repeated in a simlar fashion on the patient's right side.  Hemostasis was performed with topical Afrin soaked pledgets.  Visualization with a zero degree endoscope was used to examine the bilateral maxillary antrostomies which were noted to be widely patent and in continuity with the natural os bilaterally. ? ?Next attention was directed to the patient's ethmoid sinuses.  The left ethmoid bulla was entered with a straight image guidance suction.  The ethmoid cells were opened from a medial and inferior position superior and laterally until the vertical lamella was encountered.  This was opened and the posterior ethmoid was opened in a similar fashion.  All fragments of bone and mucosa were removed with straight biting forceps.  The skull base was identified and trauma was avoided.  Image guidance was used throughout to ensure all diseased air cells were opened.  This was repeated on the patient's right side in a similar fashion with all ethmoid air cells opened bilaterally. ? ?Attention at this time was directed to the patient's sphenoid sinuses.  On the patient's left side, the middle turbinate was lateralized and the superior turbinate was identified.  The sphenoid os was visualized and the op was sequentially dilated with an acclarent balloon device and then elarged with straight suction.  This was repeated on the patient's opposite side again with creation of a large sphenoid ostia bilaterally. ? ?At this time with all sinuses opened, the patient's nasal cavity was examinated and copiously irrigated with sterile saline.  Meticulous hemostasis was continued and all sinuses were examined and noted to  be widely patent.   ? ?At this time, Stamberger sinufoam was placed into the posterior ethmoid cavities and maxillary antrostomies and near sphenoidotomy.  A half a sheet of Xerogel was placed lateral to the middle turbinates in the ethmoid space bilaterally and inflated with sterile  saline.  Stamberger sinufoam was next placed on top of the Xerogel. ? ?Care of the patient at this time was transferred to anesthesia and was extubated and taken to PACU in good condition. ? ?Dispo:   PACU to home ? ?Plan: Ice, elevation, narcotic analgesia and prophylactic antibiotics.  We will reevaluate the patient in the office in 7 days.  Hold strenuous activities in two weeks. ? ? ?Tyson Parkison ?09/28/2021 ?9:14 AM ? ?  ? ?

## 2021-09-28 NOTE — Anesthesia Preprocedure Evaluation (Signed)
Anesthesia Evaluation  ?Patient identified by MRN, date of birth, ID band ?Patient awake ? ? ? ?Reviewed: ?Allergy & Precautions, H&P , NPO status , Patient's Chart, lab work & pertinent test results, reviewed documented beta blocker date and time  ? ?Airway ?Mallampati: II ? ?TM Distance: >3 FB ?Neck ROM: full ? ? ? Dental ?no notable dental hx. ? ?  ?Pulmonary ?former smoker,  ?  ?Pulmonary exam normal ?breath sounds clear to auscultation ? ? ? ? ? ? Cardiovascular ?Exercise Tolerance: Good ?hypertension, Normal cardiovascular exam ?Rhythm:regular Rate:Normal ? ? ?  ?Neuro/Psych ? Headaches, Anxiety Depression negative psych ROS  ? GI/Hepatic ?Neg liver ROS, GERD  ,  ?Endo/Other  ?negative endocrine ROS ? Renal/GU ?CRFRenal disease  ?negative genitourinary ?  ?Musculoskeletal ? ? Abdominal ?  ?Peds ? Hematology ?negative hematology ROS ?(+)   ?Anesthesia Other Findings ? ? Reproductive/Obstetrics ?negative OB ROS ? ?  ? ? ? ? ? ? ? ? ? ? ? ? ? ?  ?  ? ? ? ? ? ? ? ? ?Anesthesia Physical ?Anesthesia Plan ? ?ASA: 2 ? ?Anesthesia Plan: General  ? ?Post-op Pain Management:   ? ?Induction:  ? ?PONV Risk Score and Plan:  ? ?Airway Management Planned:  ? ?Additional Equipment:  ? ?Intra-op Plan:  ? ?Post-operative Plan:  ? ?Informed Consent: I have reviewed the patients History and Physical, chart, labs and discussed the procedure including the risks, benefits and alternatives for the proposed anesthesia with the patient or authorized representative who has indicated his/her understanding and acceptance.  ? ? ? ?Dental Advisory Given ? ?Plan Discussed with: CRNA and Anesthesiologist ? ?Anesthesia Plan Comments:   ? ? ? ? ? ? ?Anesthesia Quick Evaluation ? ?

## 2021-09-28 NOTE — Transfer of Care (Signed)
Immediate Anesthesia Transfer of Care Note ? ?Patient: Elizabeth Bowers ? ?Procedure(s) Performed: IMAGE GUIDED SINUS SURGERY (Nose) ?MAXILLARY ANTROSTOMY WITHOUT TISSUE REMOVAL (Bilateral: Nose) ?TOTAL ETHMOIDECTOMY (Nose) ?SPHENOIDOTOMY (Bilateral: Nose) ? ?Patient Location: PACU ? ?Anesthesia Type: General ? ?Level of Consciousness: awake, alert  and patient cooperative ? ?Airway and Oxygen Therapy: Patient Spontanous Breathing and Patient connected to supplemental oxygen ? ?Post-op Assessment: Post-op Vital signs reviewed, Patient's Cardiovascular Status Stable, Respiratory Function Stable, Patent Airway and No signs of Nausea or vomiting ? ?Post-op Vital Signs: Reviewed and stable ? ?Complications: No notable events documented. ? ?

## 2021-09-29 ENCOUNTER — Encounter: Payer: Self-pay | Admitting: Otolaryngology

## 2021-09-30 LAB — SURGICAL PATHOLOGY

## 2021-10-02 NOTE — Progress Notes (Signed)
PROVIDER NOTE: Information contained herein reflects review and annotations entered in association with encounter. Interpretation of such information and data should be left to medically-trained personnel. Information provided to patient can be located elsewhere in the medical record under "Patient Instructions". Document created using STT-dictation technology, any transcriptional errors that may result from process are unintentional.  ?  ?Patient: Elizabeth Bowers  Service Category: E/M  Provider: Gaspar Cola, MD  ?DOB: 06-Aug-1965  DOS: 10/04/2021  Specialty: Interventional Pain Management  ?MRN: [id number]  Setting: Ambulatory outpatient  PCP: Virginia Crews, MD  ?Type: Established Patient    Referring Provider: Virginia Crews, MD  ?Location: Office  Delivery: Face-to-face    ? ?HPI  ?Ms. [place], a 56 y.o. year old female, is here today because of her Chronic bilateral low back pain with bilateral sciatica [M54.42, M54.41, G89.29]. Ms. Splinter primary complain today is Back Pain ?Last encounter: My last encounter with her was on 09/08/2021. ?Pertinent problems: Ms. Lovecchio has Chronic sacroiliac joint pain (Right); Fibromyalgia; Chronic knee pain (Right); DDD (degenerative disc disease), lumbar; Chronic low back pain (2ry area of Pain) (Bilateral) (L>R) w/ sciatica (Bilateral); Chronic lower extremity pain (3ry area of Pain) (Bilateral) (L>R); Chronic neck pain (1ry area of Pain) (Bilateral) (midline); Chronic foot pain (Left); Chronic pain syndrome; Ganglion cyst; Chronic upper extremity pain (Fourth Area of Pain) (Bilateral) (R>L); Cervical Grade 1 (2 mm) Retrolisthesis C5 over C6; Cervical foraminal stenosis (C3-4 & C5-6) (Bilateral); DDD (degenerative disc disease), cervical; Spondylosis without myelopathy or radiculopathy, cervical region; Chronic hip pain (Bilateral) (L>R); Lumbar facet syndrome (Bilateral) (R>L); Spondylosis without myelopathy or radiculopathy, lumbar region; Chronic  Sacroiliac joint dysfunction (Bilateral) (R>L); Chronic Somatic dysfunction of sacroiliac joint (Bilateral) (R>L); Osteoarthritis of hip (Bilateral); Cervicogenic headache (Bilateral); Cervicalgia (Bilateral) (R>L); Cervical facet syndrome (Bilateral) (R>L); Chronic occipital neuralgia (Bilateral); Cervical spondylitis with radiculitis (HCC) (C6) (Bilateral) (R>L); Chronic sacroiliac joint pain (Bilateral) (R>L); Abnormal MRI, cervical spine (07/10/2018); Chronic low back pain (Bilateral) w/o sciatica; Morton's neuroma of foot (Left); Chronic hip pain (Right); Impaired range of motion of hip (Right); Chronic lower extremity pain (Right); Lower extremity radicular pain (Right); and Lumbar radiculitis (L3/L4) (Right) on their pertinent problem list. ?Pain Assessment: Severity of Chronic pain is reported as a 0-No pain/10. Location: Back Left, Right/Denies. Onset: More than a month ago. Quality:  . Timing: Constant. Modifying factor(s): procedures. ?Vitals:  height is '5\' 2"'$  (1.575 m) and weight is 173 lb (78.5 kg). Her temperature is 97 ?F (36.1 ?C) (abnormal). Her blood pressure is 129/80 and her pulse is 85. Her oxygen saturation is 99%.  ? ?Reason for encounter: post-procedure evaluation and assessment.  The patient refers having attained 100% relief of the pain with the right L3-4 LESI #1.  Her low back pain and bilateral lower extremity pain is currently gone.  The patient was encouraged to give [place] a call if the pain returns.  She understood and accepted. ? ?Post-procedure evaluation  ? Type: Lumbar epidural steroid injection (LESI) (interlaminar) #1    ?Laterality: Right   ?Level:  L3-4 Level.  ?Imaging: Fluoroscopic guidance ?Anesthesia: Local anesthesia (1-2% Lidocaine) ?Anxiolysis: None                 ?Sedation: None. ?DOS: 09/08/2021  ?Performed by: Gaspar Cola, MD ? ?Purpose: Diagnostic/Therapeutic ?Indications: Lumbar radicular pain of intraspinal etiology of more than 4 weeks that has failed to  respond to conservative therapy and is severe enough to impact quality of life  or function. ?1. Chronic low back pain (2ry area of Pain) (Bilateral) (L>R) w/ sciatica (Bilateral)   ?2. Chronic lower extremity pain (Right)   ?3. DDD (degenerative disc disease), lumbar   ?4. Lumbar radiculitis (L3/L4) (Right)   ?5. Lower extremity radicular pain (Right)   ? ?NAS-11 Pain score:  ? Pre-procedure: 5 /10  ? Post-procedure: 2 /10  ? ?   ?Effectiveness:  ?Initial hour after procedure: 100 %. ?Subsequent 4-6 hours post-procedure: 100 %. ?Analgesia past initial 6 hours: 100 %. ?Ongoing improvement:  ?Analgesic: The patient indicates currently enjoying 100% relief of her low back pain and bilateral lower extremity pain (sciatica). ?Function: Ms. Grassel reports improvement in function ?ROM: Ms. B[place]ee reports improvement in ROM ? ?Pharmacotherapy Assessment  ?Analgesic: No chronic opioid analgesics therapy prescribed by our practice.  ? ?Monitoring: ?[place] PMP: PDMP reviewed during this encounter.       ?Pharmacotherapy: No side-effects or adverse reactions reported. ?Compliance: No problems identified. ?Effectiveness: Clinically acceptable. ? ?Chauncey Fischer, RN  10/04/2021  1:56 PM  Sign when Signing Visit ?Safety precautions to be maintained throughout the outpatient stay will include: orient to surroundings, keep bed in low position, maintain call bell within reach at all times, provide assistance with transfer out of bed and ambulation.  ?   UDS:  ?Summary  ?Date Value Ref Range Status  ?01/16/2018 FINAL  Final  ?  Comment:  ?  ==================================================================== ?[facility] COMP DRUG ANALYSIS,UR ?==================================================================== ?Test                             Result       Flag       Units ?Drug Present and Declared for Prescription Verification ?  Oxazepam                       51           EXPECTED   ng/mg creat ?  Temazepam                      44            EXPECTED   ng/mg creat ?   Oxazepam and temazepam are expected metabolites of diazepam. ?   Oxazepam is also an expected metabolite of other benzodiazepine ?   drugs, including chlordiazepoxide, prazepam, clorazepate, ?   halazepam, and temazepam.  Oxazepam and temazepam are available ?   as scheduled prescription medications. ?  Phentermine                    PRESENT      EXPECTED ?  Duloxetine                     PRESENT      EXPECTED ?Drug Present not Declared for Prescription Verification ?  Topiramate                     PRESENT      UNEXPECTED ?  Acetaminophen                  PRESENT      UNEXPECTED ?Drug Absent but Declared for Prescription Verification ?  Cyclobenzaprine                Not Detected UNEXPECTED ?  Promethazine  Not Detected UNEXPECTED ?==================================================================== ?Test                      Result    Flag   Units      Ref Range ?  Creatinine              57               mg/dL      >=20 ?==================================================================== ?Declared Medications: ? The flagging and interpretation on this report are based on the ? following declared medications.  Unexpected results may arise from ? inaccuracies in the declared medications. ? **Note: The testing scope of this panel includes these medications: ? Cyclobenzaprine ? Diazepam ? Duloxetine ? Phentermine ? Promethazine ? **Note: The testing scope of this panel does not include following ? reported medications: ? Acyclovir ? Budesonide ? Docusate ? Hydrochlorothiazide (Triamterine-Hydrochlorthzide) ? Levocetirizine ? Linaclotide ? Meclizine ? Montelukast ? Multivitamin ? Potassium ? Triamterene (Triamterine-Hydrochlorthzide) ? Valacyclovir ?==================================================================== ?For clinical consultation, please call [phone]. ?==================================================================== ?  ?  ? ?ROS  ?Constitutional:  Denies any fever or chills ?Gastrointestinal: No reported hemesis, hematochezia, vomiting, or acute GI distress ?Musculoskeletal: Denies any acute onset joint swelling, redness, loss of ROM, or weakness ?Neurolog

## 2021-10-03 ENCOUNTER — Other Ambulatory Visit (HOSPITAL_COMMUNITY): Payer: Self-pay

## 2021-10-03 ENCOUNTER — Other Ambulatory Visit: Payer: Self-pay | Admitting: Family Medicine

## 2021-10-03 DIAGNOSIS — K581 Irritable bowel syndrome with constipation: Secondary | ICD-10-CM

## 2021-10-03 MED ORDER — LINZESS 290 MCG PO CAPS
290.0000 ug | ORAL_CAPSULE | Freq: Every day | ORAL | 0 refills | Status: DC
Start: 2021-10-03 — End: 2021-12-26
  Filled 2021-10-03: qty 90, 90d supply, fill #0

## 2021-10-03 NOTE — Telephone Encounter (Signed)
Lr:07/14/2021 Qty:90 r:0 ?LOV:09/23/2021 ?NOV:10/06/2021 ?

## 2021-10-04 ENCOUNTER — Ambulatory Visit: Payer: 59 | Attending: Pain Medicine | Admitting: Pain Medicine

## 2021-10-04 ENCOUNTER — Encounter: Payer: Self-pay | Admitting: Pain Medicine

## 2021-10-04 ENCOUNTER — Other Ambulatory Visit (HOSPITAL_COMMUNITY): Payer: Self-pay

## 2021-10-04 VITALS — BP 129/80 | HR 85 | Temp 97.0°F | Ht 62.0 in | Wt 173.0 lb

## 2021-10-04 DIAGNOSIS — G8929 Other chronic pain: Secondary | ICD-10-CM | POA: Diagnosis not present

## 2021-10-04 DIAGNOSIS — M25551 Pain in right hip: Secondary | ICD-10-CM | POA: Diagnosis not present

## 2021-10-04 DIAGNOSIS — M5441 Lumbago with sciatica, right side: Secondary | ICD-10-CM | POA: Diagnosis not present

## 2021-10-04 DIAGNOSIS — M25651 Stiffness of right hip, not elsewhere classified: Secondary | ICD-10-CM

## 2021-10-04 DIAGNOSIS — M5136 Other intervertebral disc degeneration, lumbar region: Secondary | ICD-10-CM | POA: Insufficient documentation

## 2021-10-04 DIAGNOSIS — M5442 Lumbago with sciatica, left side: Secondary | ICD-10-CM | POA: Insufficient documentation

## 2021-10-04 DIAGNOSIS — M5416 Radiculopathy, lumbar region: Secondary | ICD-10-CM | POA: Diagnosis not present

## 2021-10-04 DIAGNOSIS — M51369 Other intervertebral disc degeneration, lumbar region without mention of lumbar back pain or lower extremity pain: Secondary | ICD-10-CM

## 2021-10-04 DIAGNOSIS — M79604 Pain in right leg: Secondary | ICD-10-CM | POA: Insufficient documentation

## 2021-10-04 DIAGNOSIS — M79672 Pain in left foot: Secondary | ICD-10-CM | POA: Diagnosis not present

## 2021-10-04 DIAGNOSIS — M5410 Radiculopathy, site unspecified: Secondary | ICD-10-CM

## 2021-10-04 NOTE — Progress Notes (Signed)
Safety precautions to be maintained throughout the outpatient stay will include: orient to surroundings, keep bed in low position, maintain call bell within reach at all times, provide assistance with transfer out of bed and ambulation.  

## 2021-10-05 ENCOUNTER — Other Ambulatory Visit (HOSPITAL_COMMUNITY): Payer: Self-pay

## 2021-10-05 DIAGNOSIS — J3489 Other specified disorders of nose and nasal sinuses: Secondary | ICD-10-CM | POA: Diagnosis not present

## 2021-10-05 NOTE — Progress Notes (Signed)
? ? ? ?I,Roshena L Chambers,acting as a scribe for Elizabeth Paganini, MD.,have documented all relevant documentation on the behalf of Elizabeth Paganini, MD,as directed by  Elizabeth Paganini, MD while in the presence of Elizabeth Paganini, MD.  ? ?Complete physical exam ? ? ?Patient: Elizabeth Bowers   DOB: 05/09/1966   56 y.o. Female  MRN: [id number] ?Visit Date: 10/06/2021 ? ?Today's healthcare provider: Lavon Paganini, MD  ? ?Chief Complaint  ?Patient presents with  ? Annual Exam  ? ?Subjective  ?  ?Haizley Debara Bowers is a 56 y.o. female who presents today for a complete physical exam.  ?She reports consuming a general diet. The patient does not participate in regular exercise at present. She generally feels fairly well. She reports sleeping fairly well. She does not have additional problems to discuss today.  ?HPI  ?Mammogram scheduled 10/14/2021 ? ?Past Medical History:  ?Diagnosis Date  ? Fatty liver   ? Fibromyalgia   ? GERD (gastroesophageal reflux disease)   ? Hemorrhoids   ? Hot flashes   ? Hypertension   ? IBS (irritable bowel syndrome)   ? LUQ abdominal pain 10/25/2016  ? Migraines   ? Miscarriage   ? x 2; 4 live births   ? Multilevel degenerative disc disease   ? Neuroma   ? Vocal cord dysfunction   ? ?Past Surgical History:  ?Procedure Laterality Date  ? ABDOMINAL HYSTERECTOMY    ? 2012 removed cervix h/o abnormal pap   ? CHOLECYSTECTOMY    ? 2000  ? COLONOSCOPY    ? 2018 with + polpys and GIB 2/2 polyp removal   ? COLONOSCOPY WITH PROPOFOL N/A 12/22/2019  ? Procedure: COLONOSCOPY WITH PROPOFOL;  Surgeon: Lin Landsman, MD;  Location: [facility] ENDOSCOPY;  Service: Gastroenterology;  Laterality: N/A;  ? COLONOSCOPY WITH PROPOFOL N/A 12/23/2019  ? Procedure: COLONOSCOPY WITH PROPOFOL;  Surgeon: Lin Landsman, MD;  Location: [facility] ENDOSCOPY;  Service: Gastroenterology;  Laterality: N/A;  Last name pronounced LEE-MA  ? ELBOW DEBRIDEMENT    ? ESOPHAGOGASTRODUODENOSCOPY (EGD) WITH PROPOFOL N/A 02/06/2018  ?  Procedure: ESOPHAGOGASTRODUODENOSCOPY (EGD) WITH PROPOFOL with biopsies;  Surgeon: Lin Landsman, MD;  Location: [place];  Service: Endoscopy;  Laterality: N/A;  ? ETHMOIDECTOMY N/A 09/28/2021  ? Procedure: TOTAL ETHMOIDECTOMY;  Surgeon: Carloyn Manner, MD;  Location: [place];  Service: ENT;  Laterality: N/A;  ? FOOT SURGERY    ? IMAGE GUIDED SINUS SURGERY N/A 09/28/2021  ? Procedure: IMAGE GUIDED SINUS SURGERY;  Surgeon: Carloyn Manner, MD;  Location: [place];  Service: ENT;  Laterality: N/A;  PLACED DISK ON OR CHARGE NURSE DESK 4-21  KP  ? MAXILLARY ANTROSTOMY Bilateral 09/28/2021  ? Procedure: MAXILLARY ANTROSTOMY WITHOUT TISSUE REMOVAL;  Surgeon: Carloyn Manner, MD;  Location: [place];  Service: ENT;  Laterality: Bilateral;  ? SPHENOIDECTOMY Bilateral 09/28/2021  ? Procedure: SPHENOIDOTOMY;  Surgeon: Carloyn Manner, MD;  Location: [place];  Service: ENT;  Laterality: Bilateral;  ? ?Social History  ? ?Socioeconomic History  ? Marital status: Married  ?  Spouse name: Kyung Rudd  ? Number of children: 6  ? Years of education: 48  ? Highest education level: Associate degree: academic program  ?Occupational History  ? Occupation: Respiratory Therapist  ?  Employer: [employer]  ?Tobacco Use  ? Smoking status: Former  ?  Packs/day: 0.75  ?  Years: 5.00  ?  Pack years: 3.75  ?  Types: Cigarettes  ?  Quit date: 07/06/1987  ?  Years since quitting: 34.2  ? Smokeless tobacco: Never  ?Vaping Use  ? Vaping Use: Never used  ?Substance and Sexual Activity  ? Alcohol use: Yes  ?  Alcohol/week: 2.0 - 4.0 standard drinks  ?  Types: 2 - 4 Cans of beer per week  ?  Comment: wine cooler  ? Drug use: Never  ? Sexual activity: Not on file  ?Other Topics Concern  ? Not on file  ?Social History Narrative  ? Not on file  ? ?Social Determinants of Health  ? ?Financial Resource Strain: Not on file  ?Food Insecurity: Not on file  ?Transportation Needs: Not on file  ?Physical  Activity: Not on file  ?Stress: Not on file  ?Social Connections: Not on file  ?Intimate Partner Violence: Not on file  ? ?Family Status  ?Relation Name Status  ? Mother  Alive  ? Father  Deceased  ? Sister  Deceased  ? Sister  Alive  ? Brother  Alive  ? Daughter  Alive  ? Son Randall Hiss Alive  ? MGM  Deceased  ? PGM  (Not Specified)  ? PGF  (Not Specified)  ? Sister  Alive  ? Son  Alive  ? Son  (Not Specified)  ? Neg Hx  (Not Specified)  ? ?Family History  ?Problem Relation Age of Onset  ? Hypertension Mother   ? Hyperlipidemia Mother   ? Glaucoma Mother   ? Diabetes Mother   ?     type 2   ? COPD Father   ?     emphysema  ? Alcohol abuse Father   ? Colon cancer Sister   ? Cancer Sister   ?     rectal/colon cancer no h/o IBD  ? Colon polyps Sister   ? Asthma Sister   ? COPD Sister   ? Kidney disease Sister   ? Mental illness Sister   ?     bipolar   ? Miscarriages / Stillbirths Sister   ? Uterine cancer Sister   ? Alcohol abuse Son   ? Depression Son   ? Diabetes Son   ? Drug abuse Son   ? Mental illness Son   ?     bipolar   ? Diabetes Maternal Grandmother   ? COPD Paternal Grandmother   ? Cancer Paternal Grandfather   ?     FH colon cancer maternal aunts/uncles   ? Miscarriages / Stillbirths Sister   ? Arthritis Son   ? Arthritis Son   ? Breast cancer Neg Hx   ? ?Allergies  ?Allergen Reactions  ? Gluten Meal   ?  Other reaction(s): Abdominal Pain, Myalgias (Muscle Pain) ?  ? Lactose   ?  Other reaction(s): Abdominal Pain, Myalgias (Muscle Pain) ?  ? Ranitidine Diarrhea and Other (See Comments)  ?  Diarrhea and stomach cramping  ? Amphetamine-Dextroamphetamine Other (See Comments)  ?  Elevated BP  ? Lactose Intolerance (Gi)   ?  bloating  ? Reglan [Metoclopramide] Other (See Comments)  ?  Increased prolactin level   ? Topiramate Er Other (See Comments)  ?  Severe headache, nausea, vomiting, dizziness, visual disturbance    ? Wheat Bran   ?  bloating  ? Soap Rash  ?  PROVON excess use  ? Sulfa Antibiotics Anxiety  ?   ?Patient Care Team: ?Virginia Crews, MD as PCP - General (Family Medicine)  ? ?Medications: ?Outpatient Medications Prior to Visit  ?Medication Sig  ? amLODipine (NORVASC) 5  MG tablet Take 1 tablet (5 mg total) by mouth daily.  ? amoxicillin-clavulanate (AUGMENTIN) 875-125 MG tablet Take 1 tablet by mouth 2 (two) times daily.  ? buPROPion (WELLBUTRIN XL) 300 MG 24 hr tablet Take 1 tablet (300 mg total) by mouth daily.  ? DULoxetine (CYMBALTA) 60 MG capsule TAKE 1 CAPSULE BY MOUTH DAILY.  ? fluticasone (FLONASE) 50 MCG/ACT nasal spray Place 2 sprays into both nostrils daily.  ? lansoprazole (PREVACID) 30 MG capsule TAKE 1 CAPSULE BY MOUTH TWICE DAILY BEFORE A MEAL.  ? LINZESS 290 MCG CAPS capsule TAKE 1 CAPSULE BY MOUTH DAILY.  ? Multiple Vitamin (MULTIVITAMIN WITH MINERALS) TABS tablet Take 1 tablet by mouth daily.  ? ondansetron (ZOFRAN) 4 MG tablet Take 1 tablet (4 mg total) by mouth every 8 (eight) hours as needed for up to 20 doses for nausea or vomiting.  ? promethazine (PHENERGAN) 25 MG suppository Place 1 suppository (25 mg total) rectally every 6 (six) hours as needed for nausea or vomiting.  ? psyllium (METAMUCIL) 58.6 % powder Take 2 packets by mouth daily.  ? triamterene-hydrochlorothiazide (MAXZIDE-25) 37.5-25 MG tablet TAKE 1 TABLET BY MOUTH DAILY.  ? vitamin C (ASCORBIC ACID) 500 MG tablet Take 500 mg by mouth daily.  ? ?No facility-administered medications prior to visit.  ? ? ?Review of Systems  ?Constitutional:  Positive for diaphoresis. Negative for appetite change, chills, fatigue and fever.  ?Respiratory:  Negative for chest tightness and shortness of breath.   ?Cardiovascular:  Negative for chest pain and palpitations.  ?Gastrointestinal:  Negative for abdominal pain, nausea and vomiting.  ?Neurological:  Negative for dizziness and weakness.  ?Hematological:  Bruises/bleeds easily.  ? ?Last CBC ?Lab Results  ?Component Value Date  ? WBC 7.3 09/23/2021  ? HGB 15.0 09/23/2021  ? HCT 44.3  09/23/2021  ? MCV 91 09/23/2021  ? MCH 30.9 09/23/2021  ? RDW 12.6 09/23/2021  ? PLT 301 09/23/2021  ? ?Last metabolic panel ?Lab Results  ?Component Value Date  ? GLUCOSE 97 09/23/2021  ? NA 139 09/23/2021  ?

## 2021-10-06 ENCOUNTER — Encounter: Payer: Self-pay | Admitting: Family Medicine

## 2021-10-06 ENCOUNTER — Ambulatory Visit (INDEPENDENT_AMBULATORY_CARE_PROVIDER_SITE_OTHER): Payer: 59 | Admitting: Family Medicine

## 2021-10-06 VITALS — BP 119/80 | HR 87 | Temp 98.7°F | Resp 14 | Ht 62.0 in | Wt 177.8 lb

## 2021-10-06 DIAGNOSIS — E669 Obesity, unspecified: Secondary | ICD-10-CM

## 2021-10-06 DIAGNOSIS — F419 Anxiety disorder, unspecified: Secondary | ICD-10-CM | POA: Diagnosis not present

## 2021-10-06 DIAGNOSIS — Z0000 Encounter for general adult medical examination without abnormal findings: Secondary | ICD-10-CM

## 2021-10-06 DIAGNOSIS — F331 Major depressive disorder, recurrent, moderate: Secondary | ICD-10-CM | POA: Diagnosis not present

## 2021-10-06 DIAGNOSIS — I10 Essential (primary) hypertension: Secondary | ICD-10-CM | POA: Diagnosis not present

## 2021-10-06 DIAGNOSIS — K760 Fatty (change of) liver, not elsewhere classified: Secondary | ICD-10-CM | POA: Diagnosis not present

## 2021-10-06 DIAGNOSIS — Z6832 Body mass index (BMI) 32.0-32.9, adult: Secondary | ICD-10-CM | POA: Diagnosis not present

## 2021-10-06 NOTE — Assessment & Plan Note (Signed)
Discussed importance of healthy weight management Discussed diet and exercise  

## 2021-10-06 NOTE — Assessment & Plan Note (Signed)
Chronic and well controlled ?Tremor may be related to wellbutrin (started after last increase in dose), but will continue current meds as she is well controlled ?

## 2021-10-06 NOTE — Assessment & Plan Note (Signed)
Chronic and well controlled ?Continue current meds ?

## 2021-10-06 NOTE — Assessment & Plan Note (Signed)
Stable LFTs on recent labs ?

## 2021-10-06 NOTE — Assessment & Plan Note (Signed)
Well controlled Continue current medications Reviewed recent metabolic panel F/u in 6 months  

## 2021-10-14 ENCOUNTER — Ambulatory Visit
Admission: RE | Admit: 2021-10-14 | Discharge: 2021-10-14 | Disposition: A | Discharge status: Home / Self Care | Payer: 59 | Source: Ambulatory Visit | Attending: Family Medicine | Admitting: Family Medicine

## 2021-10-14 DIAGNOSIS — Z1231 Encounter for screening mammogram for malignant neoplasm of breast: Secondary | ICD-10-CM | POA: Diagnosis not present

## 2021-10-19 ENCOUNTER — Other Ambulatory Visit (HOSPITAL_COMMUNITY): Payer: Self-pay

## 2021-10-19 ENCOUNTER — Other Ambulatory Visit: Payer: Self-pay | Admitting: Family Medicine

## 2021-10-19 ENCOUNTER — Other Ambulatory Visit: Payer: Self-pay

## 2021-10-19 DIAGNOSIS — J324 Chronic pansinusitis: Secondary | ICD-10-CM | POA: Diagnosis not present

## 2021-10-19 MED ORDER — BUDESONIDE 0.5 MG/2ML IN SUSP
RESPIRATORY_TRACT | 0 refills | Status: DC
  Filled 2021-10-19: qty 120, 30d supply, fill #0

## 2021-10-19 MED ORDER — BUPROPION HCL ER (XL) 300 MG PO TB24
300.0000 mg | ORAL_TABLET | Freq: Every day | ORAL | 0 refills | Status: DC
  Filled 2021-10-19: qty 90, 90d supply, fill #0

## 2021-10-26 DIAGNOSIS — Z760 Encounter for issue of repeat prescription: Secondary | ICD-10-CM | POA: Diagnosis not present

## 2021-11-03 ENCOUNTER — Other Ambulatory Visit (HOSPITAL_COMMUNITY): Payer: Self-pay

## 2021-11-03 ENCOUNTER — Other Ambulatory Visit: Payer: Self-pay | Admitting: Family Medicine

## 2021-11-03 DIAGNOSIS — K219 Gastro-esophageal reflux disease without esophagitis: Secondary | ICD-10-CM

## 2021-11-03 MED ORDER — DULOXETINE HCL 60 MG PO CPEP
ORAL_CAPSULE | Freq: Every day | ORAL | 1 refills | Status: DC
  Filled 2021-11-03: qty 90, 90d supply, fill #0
  Filled 2022-01-17: qty 90, 90d supply, fill #1

## 2021-11-03 MED ORDER — LANSOPRAZOLE 30 MG PO CPDR
DELAYED_RELEASE_CAPSULE | ORAL | 0 refills | Status: DC
  Filled 2021-11-03: qty 180, 90d supply, fill #0

## 2021-11-11 DIAGNOSIS — J3489 Other specified disorders of nose and nasal sinuses: Secondary | ICD-10-CM | POA: Diagnosis not present

## 2021-12-05 ENCOUNTER — Other Ambulatory Visit: Payer: Self-pay | Admitting: Family Medicine

## 2021-12-07 ENCOUNTER — Other Ambulatory Visit (HOSPITAL_COMMUNITY): Payer: Self-pay

## 2021-12-07 MED ORDER — TRIAMTERENE-HCTZ 37.5-25 MG PO TABS
1.0000 | ORAL_TABLET | Freq: Every day | ORAL | 1 refills | Status: DC
Start: 2021-12-07 — End: 2022-05-29
  Filled 2021-12-07: qty 90, 90d supply, fill #0
  Filled 2022-01-17 – 2022-02-27 (×2): qty 90, 90d supply, fill #1

## 2021-12-26 ENCOUNTER — Other Ambulatory Visit: Payer: Self-pay | Admitting: Family Medicine

## 2021-12-26 DIAGNOSIS — K581 Irritable bowel syndrome with constipation: Secondary | ICD-10-CM

## 2021-12-27 ENCOUNTER — Other Ambulatory Visit (HOSPITAL_COMMUNITY): Payer: Self-pay

## 2021-12-27 MED ORDER — LINZESS 290 MCG PO CAPS
290.0000 ug | ORAL_CAPSULE | Freq: Every day | ORAL | 0 refills | Status: DC
  Filled 2021-12-27: qty 90, 90d supply, fill #0

## 2021-12-28 ENCOUNTER — Ambulatory Visit: Payer: Self-pay

## 2021-12-28 NOTE — Telephone Encounter (Signed)
Pt called back to schedule Mychart appointment for sinus infection. Got disconnected with pt, called her back but no answer. Left vm to call back to schedule OV.

## 2021-12-28 NOTE — Telephone Encounter (Signed)
  Chief Complaint: sinus infection Symptoms: sinus pressure behind eyes, nasal drainage greenish color, stuffy nose, cough Frequency: 2 weeks Pertinent Negatives: Patient denies fever, earache, sore throat Disposition: '[]'$ ED /'[]'$ Urgent Care (no appt availability in office) / '[]'$ Appointment(In office/virtual)/ '[]'$  [place] [facility]/ '[]'$ Home Care/ '[]'$ Refused Recommended Disposition /'[]'$ [place] [facility]/ '[x]'$  Follow-up with PCP Additional Notes: spoke with pt, she was currently at work doing rounds in ICU and unable to speak for length of time. Pt states she has been doing nasal washes and using nasal sprays but nothing has helped. Advised would need an appt. Pt states she is working and not off again until next Thursday but if absolutely necessary can try to find time to do a video visit. Was trying to look at schedule for appt but pt had to go and said to have someone call her back if appt was needed. Pt can be reached at [phone].    Summary: sinus discomfort / rx req   The patient has called to share that they've experienced sinus discomfort for roughly a week   The patient shares that their mucus is green in color   The patient would like to be prescribed something for their discomfort   The patient can be reached at [phone]   Please contact further when possible      Reason for Disposition  [1] Sinus congestion (pressure, fullness) AND [2] present > 10 days  Answer Assessment - Initial Assessment Questions 2. ONSET: "When did the sinus pain start?"  (e.g., hours, days)      2 weeks 3. SEVERITY: "How bad is the pain?"   (Scale 1-10; mild, moderate or severe)   - MILD (1-3): doesn't interfere with normal activities    - MODERATE (4-7): interferes with normal activities (e.g., work or school) or awakens from sleep   - SEVERE (8-10): excruciating pain and patient unable to do any normal activities        5  5. NASAL CONGESTION: "Is the nose blocked?" If Yes, ask: "Can you  open it or must you breathe through your mouth?"     Yes,  6. NASAL DISCHARGE: "Do you have discharge from your nose?" If so ask, "What color?"     Greenish color 7. FEVER: "Do you have a fever?" If Yes, ask: "What is it, how was it measured, and when did it start?"      no 8. OTHER SYMPTOMS: "Do you have any other symptoms?" (e.g., sore throat, cough, earache, difficulty breathing)     Cough  Protocols used: Sinus Pain or Congestion-A-AH

## 2021-12-29 ENCOUNTER — Other Ambulatory Visit (HOSPITAL_COMMUNITY): Payer: Self-pay

## 2021-12-30 NOTE — Telephone Encounter (Signed)
noted 

## 2022-01-17 ENCOUNTER — Other Ambulatory Visit: Payer: Self-pay | Admitting: Family Medicine

## 2022-01-17 ENCOUNTER — Other Ambulatory Visit: Payer: Self-pay | Admitting: Physician Assistant

## 2022-01-17 ENCOUNTER — Other Ambulatory Visit (HOSPITAL_COMMUNITY): Payer: Self-pay

## 2022-01-17 DIAGNOSIS — K219 Gastro-esophageal reflux disease without esophagitis: Secondary | ICD-10-CM

## 2022-01-17 DIAGNOSIS — K581 Irritable bowel syndrome with constipation: Secondary | ICD-10-CM

## 2022-01-17 MED ORDER — BUPROPION HCL ER (XL) 300 MG PO TB24
300.0000 mg | ORAL_TABLET | Freq: Every day | ORAL | 0 refills | Status: DC
  Filled 2022-01-17: qty 90, 90d supply, fill #0

## 2022-01-17 MED ORDER — LANSOPRAZOLE 30 MG PO CPDR
DELAYED_RELEASE_CAPSULE | ORAL | 0 refills | Status: DC
  Filled 2022-01-17: qty 180, fill #0
  Filled 2022-02-07: qty 180, 90d supply, fill #0

## 2022-01-17 MED ORDER — AMLODIPINE BESYLATE 5 MG PO TABS
5.0000 mg | ORAL_TABLET | Freq: Every day | ORAL | 0 refills | Status: DC
  Filled 2022-01-17 – 2022-02-27 (×3): qty 90, 90d supply, fill #0

## 2022-01-18 ENCOUNTER — Other Ambulatory Visit (HOSPITAL_COMMUNITY): Payer: Self-pay

## 2022-01-20 ENCOUNTER — Other Ambulatory Visit (HOSPITAL_COMMUNITY): Payer: Self-pay

## 2022-01-23 NOTE — Progress Notes (Deleted)
    I,Jana Myishia Kasik,acting as a Education administrator for [employer], PA-C.,have documented all relevant documentation on the behalf of Mardene Speak, PA-C,as directed by  [employer], PA-C while in the presence of [employer], PA-C.    Established patient visit   Patient: Elizabeth Bowers   DOB: 1966-05-23   56 y.o. Female  MRN: [id number] Visit Date: 01/24/2022  Today's healthcare provider: Mardene Speak, PA-C   No chief complaint on file.  Subjective    Patient presents today reporting elevated blood pressure readings.   Medications: Outpatient Medications Prior to Visit  Medication Sig   amLODipine (NORVASC) 5 MG tablet Take 1 tablet (5 mg total) by mouth daily.   amoxicillin-clavulanate (AUGMENTIN) 875-125 MG tablet Take 1 tablet by mouth 2 (two) times daily.   budesonide (PULMICORT) 0.5 MG/2ML nebulizer solution One BID as directed   buPROPion (WELLBUTRIN XL) 300 MG 24 hr tablet Take 1 tablet (300 mg total) by mouth daily.   DULoxetine (CYMBALTA) 60 MG capsule TAKE 1 CAPSULE BY MOUTH DAILY.   fluticasone (FLONASE) 50 MCG/ACT nasal spray Place 2 sprays into both nostrils daily.   lansoprazole (PREVACID) 30 MG capsule TAKE 1 CAPSULE BY MOUTH TWICE DAILY BEFORE A MEAL.   LINZESS 290 MCG CAPS capsule TAKE 1 CAPSULE BY MOUTH DAILY.   Multiple Vitamin (MULTIVITAMIN WITH MINERALS) TABS tablet Take 1 tablet by mouth daily.   ondansetron (ZOFRAN) 4 MG tablet Take 1 tablet (4 mg total) by mouth every 8 (eight) hours as needed for up to 20 doses for nausea or vomiting.   promethazine (PHENERGAN) 25 MG suppository Place 1 suppository (25 mg total) rectally every 6 (six) hours as needed for nausea or vomiting.   psyllium (METAMUCIL) 58.6 % powder Take 2 packets by mouth daily.   triamterene-hydrochlorothiazide (MAXZIDE-25) 37.5-25 MG tablet TAKE 1 TABLET BY MOUTH DAILY.   vitamin C (ASCORBIC ACID) 500 MG tablet Take 500 mg by mouth daily.   No facility-administered medications prior to visit.     Review of Systems  {Labs  Heme  Chem  Endocrine  Serology  Results Review (optional):23779}   Objective    There were no vitals taken for this visit. {Show previous vital signs (optional):23777}  Physical Exam  ***  No results found for any visits on 01/24/22.  Assessment & Plan     ***  No follow-ups on file.      {provider attestation***:1}   Mardene Speak, Hershal Coria  [facility] [phone] (phone) [phone] (fax)  [place]

## 2022-01-24 ENCOUNTER — Ambulatory Visit: Payer: 59 | Admitting: Physician Assistant

## 2022-01-24 DIAGNOSIS — I10 Essential (primary) hypertension: Secondary | ICD-10-CM

## 2022-01-24 DIAGNOSIS — F331 Major depressive disorder, recurrent, moderate: Secondary | ICD-10-CM

## 2022-01-24 DIAGNOSIS — K760 Fatty (change of) liver, not elsewhere classified: Secondary | ICD-10-CM

## 2022-01-24 DIAGNOSIS — E669 Obesity, unspecified: Secondary | ICD-10-CM

## 2022-01-27 ENCOUNTER — Other Ambulatory Visit (HOSPITAL_COMMUNITY): Payer: Self-pay

## 2022-02-07 ENCOUNTER — Other Ambulatory Visit (HOSPITAL_COMMUNITY): Payer: Self-pay

## 2022-02-08 ENCOUNTER — Ambulatory Visit
Admission: EM | Admit: 2022-02-08 | Discharge: 2022-02-08 | Disposition: A | Discharge status: Home / Self Care | Payer: 59 | Attending: Family Medicine | Admitting: Family Medicine

## 2022-02-08 ENCOUNTER — Encounter: Payer: Self-pay | Admitting: Emergency Medicine

## 2022-02-08 DIAGNOSIS — H8110 Benign paroxysmal vertigo, unspecified ear: Secondary | ICD-10-CM

## 2022-02-08 DIAGNOSIS — I10 Essential (primary) hypertension: Secondary | ICD-10-CM | POA: Diagnosis not present

## 2022-02-08 NOTE — Discharge Instructions (Addendum)
Take 2-tablets of Norvasc daily. Follow up with your primary care doctor in 2 weeks. Take your blood pressure first thing in the morning and keep a record for your doctor. See record sheet attached.

## 2022-02-08 NOTE — ED Triage Notes (Signed)
Pt presents with elevated BP. She has a history of hypertension and had her medication adjusted 2 months ago but it has not helped. Pt was unable to get in with her PCP. Her BP today was 157/105 she feels lightheaded today and "disconnected".

## 2022-02-08 NOTE — ED Provider Notes (Signed)
[facility]    CSN: [id number] Arrival date & time: 02/08/22  1214      History   Chief Complaint Chief Complaint  Patient presents with   Hypertension    HPI Elizabeth Bowers is a 56 y.o. female.   HPI  Elizabeth Bowers presents for elevated blood pressure. States about a month and a half ago she brought a new cuff.  BP this morning was 157/105.  She is taking amlodipine 5 mg and HCTZ 25 mg. BP home range has been 168/123 -  135/87. There  has been several medication changes in the last year.  A couple months ago amlodipine was increased to 5 mg.    Denies missing doses of antihypertensive medications. Has chronic chest pain related to muscle spasms. She has lightheadedness and feels "disconnected" and really foggy. Denies palpitations, lower extremity edema, exertional dyspnea, headaches and vision changes.  Home BP Monitoring: Yes    Fever : no  Chills: no Sore throat: no   Cough: yes Sputum: no Nasal congestion : yes Appetite: normal  Hydration: normal  Abdominal pain: no Nausea: no Vomiting: no Dysuria: no  Consitipation: takes linzess  Sleep disturbance: no Back Pain: no Headache: no   Past Medical History:  Diagnosis Date   Fatty liver    Fibromyalgia    GERD (gastroesophageal reflux disease)    Hemorrhoids    Hot flashes    Hypertension    IBS (irritable bowel syndrome)    LUQ abdominal pain 10/25/2016   Migraines    Miscarriage    x 2; 4 live births    Multilevel degenerative disc disease    Neuroma    Vocal cord dysfunction     Patient Active Problem List   Diagnosis Date Noted   Chronic hip pain (Right) 08/16/2021   Impaired range of motion of hip (Right) 08/16/2021   Chronic lower extremity pain (Right) 08/16/2021   Lower extremity radicular pain (Right) 08/16/2021   Lumbar radiculitis (L3/L4) (Right) 08/16/2021   Morton's neuroma of foot (Left) 11/15/2020   MDD (major depressive disorder) 10/01/2020   Anterior tibialis tendinitis,  right 04/28/2020   Insomnia 07/07/2019   Benign essential hypertension 04/22/2019   Radial styloid tenosynovitis of left hand 01/13/2019   Pain in right hand 01/13/2019   Bilateral carpal tunnel syndrome 01/13/2019   Chronic pain of left wrist 01/13/2019   Chronic low back pain (Bilateral) w/o sciatica 08/08/2018   Abnormal MRI, cervical spine (07/10/2018) 07/17/2018   Cervical spondylitis with radiculitis (HCC) (C6) (Bilateral) (R>L) 06/26/2018   Chronic sacroiliac joint pain (Bilateral) (R>L) 06/26/2018   Cervical Grade 1 (2 mm) Retrolisthesis C5 over C6 03/20/2018   Cervical foraminal stenosis (C3-4 & C5-6) (Bilateral) 03/20/2018   DDD (degenerative disc disease), cervical 03/20/2018   Long term prescription benzodiazepine use 03/20/2018   Stage 3 chronic kidney disease ([place]) 03/20/2018   Acid reflux disease 03/20/2018   Spondylosis without myelopathy or radiculopathy, cervical region 03/20/2018   Chronic hip pain (Bilateral) (L>R) 03/20/2018   Lumbar facet syndrome (Bilateral) (R>L) 03/20/2018   Spondylosis without myelopathy or radiculopathy, lumbar region 03/20/2018   Chronic Sacroiliac joint dysfunction (Bilateral) (R>L) 03/20/2018   Chronic Somatic dysfunction of sacroiliac joint (Bilateral) (R>L) 03/20/2018   Osteoarthritis of hip (Bilateral) 03/20/2018   Cervicogenic headache (Bilateral) 03/20/2018   Cervicalgia (Bilateral) (R>L) 03/20/2018   Cervical facet syndrome (Bilateral) (R>L) 03/20/2018   Chronic occipital neuralgia (Bilateral) 03/20/2018   Chronic upper extremity pain (Fourth Area of Pain) (Bilateral) (  R>L) 03/19/2018   Ganglion cyst 02/20/2018   Chronic low back pain (2ry area of Pain) (Bilateral) (L>R) w/ sciatica (Bilateral) 01/16/2018   Chronic lower extremity pain (3ry area of Pain) (Bilateral) (L>R) 01/16/2018   Chronic neck pain (1ry area of Pain) (Bilateral) (midline) 01/16/2018   Chronic foot pain (Left) 01/16/2018   Chronic pain syndrome 01/16/2018    Pharmacologic therapy 01/16/2018   Disorder of skeletal system 01/16/2018   Problems influencing health status 01/16/2018   Class 1 obesity with serious comorbidity and body mass index (BMI) of 32.0 to 32.9 in adult 11/21/2017   Anxiety 09/28/2017   Chronic knee pain (Right) 09/28/2017   Fatty liver 07/09/2017   Colon polyps 07/09/2017   Fibromyalgia 07/09/2017   Sinusitis, chronic 07/09/2017   Allergic rhinitis 07/09/2017   IBS (irritable bowel syndrome) 07/09/2017   Eyelid dermatitis, allergic/contact 07/09/2017   Superficial thrombophlebitis 01/12/2017   Chronic sacroiliac joint pain (Right) 12/16/2016   DDD (degenerative disc disease), lumbar 07/18/2016   Family hx of colon cancer 07/18/2016    Past Surgical History:  Procedure Laterality Date   ABDOMINAL HYSTERECTOMY     2012 removed cervix h/o abnormal pap    CHOLECYSTECTOMY     2000   COLONOSCOPY     2018 with + polpys and GIB 2/2 polyp removal    COLONOSCOPY WITH PROPOFOL N/A 12/22/2019   Procedure: COLONOSCOPY WITH PROPOFOL;  Surgeon: Lin Landsman, MD;  Location: [facility] ENDOSCOPY;  Service: Gastroenterology;  Laterality: N/A;   COLONOSCOPY WITH PROPOFOL N/A 12/23/2019   Procedure: COLONOSCOPY WITH PROPOFOL;  Surgeon: Lin Landsman, MD;  Location: [facility] ENDOSCOPY;  Service: Gastroenterology;  Laterality: N/A;  Last name pronounced LEE-MA   ELBOW DEBRIDEMENT     ESOPHAGOGASTRODUODENOSCOPY (EGD) WITH PROPOFOL N/A 02/06/2018   Procedure: ESOPHAGOGASTRODUODENOSCOPY (EGD) WITH PROPOFOL with biopsies;  Surgeon: Lin Landsman, MD;  Location: [place];  Service: Endoscopy;  Laterality: N/A;   ETHMOIDECTOMY N/A 09/28/2021   Procedure: TOTAL ETHMOIDECTOMY;  Surgeon: Carloyn Manner, MD;  Location: [place];  Service: ENT;  Laterality: N/A;   FOOT SURGERY     IMAGE GUIDED SINUS SURGERY N/A 09/28/2021   Procedure: IMAGE GUIDED SINUS SURGERY;  Surgeon: Carloyn Manner, MD;  Location: [place];  Service: ENT;  Laterality: N/A;  PLACED DISK ON OR CHARGE NURSE DESK 4-21  KP   MAXILLARY ANTROSTOMY Bilateral 09/28/2021   Procedure: MAXILLARY ANTROSTOMY WITHOUT TISSUE REMOVAL;  Surgeon: Carloyn Manner, MD;  Location: [place];  Service: ENT;  Laterality: Bilateral;   SPHENOIDECTOMY Bilateral 09/28/2021   Procedure: SPHENOIDOTOMY;  Surgeon: Carloyn Manner, MD;  Location: [place];  Service: ENT;  Laterality: Bilateral;    OB History   No obstetric history on file.      Home Medications    Prior to Admission medications   Medication Sig Start Date End Date Taking? Authorizing Provider  amLODipine (NORVASC) 5 MG tablet Take 1 tablet (5 mg total) by mouth daily. 01/17/22   Gwyneth Sprout, FNP  amoxicillin-clavulanate (AUGMENTIN) 875-125 MG tablet Take 1 tablet by mouth 2 (two) times daily. 09/28/21   Vaught, Jeannie Fend, MD  budesonide (PULMICORT) 0.5 MG/2ML nebulizer solution One BID as directed 10/19/21     buPROPion (WELLBUTRIN XL) 300 MG 24 hr tablet Take 1 tablet (300 mg total) by mouth daily. 01/17/22   Gwyneth Sprout, FNP  DULoxetine (CYMBALTA) 60 MG capsule TAKE 1 CAPSULE BY MOUTH DAILY. 11/03/21 11/03/22  Virginia Crews, MD  fluticasone (FLONASE) 50 MCG/ACT nasal spray Place 2 sprays into both nostrils daily. 06/22/21   Hughie Closs, PA-C  lansoprazole (PREVACID) 30 MG capsule TAKE 1 CAPSULE BY MOUTH TWICE DAILY BEFORE A MEAL. 01/17/22 01/17/23  Gwyneth Sprout, FNP  LINZESS 290 MCG CAPS capsule TAKE 1 CAPSULE BY MOUTH DAILY. 12/27/21 12/27/22  Mikey Kirschner, PA-C  Multiple Vitamin (MULTIVITAMIN WITH MINERALS) TABS tablet Take 1 tablet by mouth daily.    [provider]  ondansetron (ZOFRAN) 4 MG tablet Take 1 tablet (4 mg total) by mouth every 8 (eight) hours as needed for up to 20 doses for nausea or vomiting. 09/28/21   Carloyn Manner, MD  promethazine (PHENERGAN) 25 MG suppository Place 1 suppository (25 mg total) rectally every 6  (six) hours as needed for nausea or vomiting. 05/05/21   Rodriguez-Southworth, Sunday Spillers, PA-C  psyllium (METAMUCIL) 58.6 % powder Take 2 packets by mouth daily.    [provider]  triamterene-hydrochlorothiazide (MAXZIDE-25) 37.5-25 MG tablet TAKE 1 TABLET BY MOUTH DAILY. 12/07/21 12/07/22  Myles Gip, DO  vitamin C (ASCORBIC ACID) 500 MG tablet Take 500 mg by mouth daily.    [provider]    Family History Family History  Problem Relation Age of Onset   Hypertension Mother    Hyperlipidemia Mother    Glaucoma Mother    Diabetes Mother        type 2    COPD Father        emphysema   Alcohol abuse Father    Colon cancer Sister    Cancer Sister        rectal/colon cancer no h/o IBD   Colon polyps Sister    Asthma Sister    COPD Sister    Kidney disease Sister    Mental illness Sister        bipolar    Miscarriages / [place] Sister    Uterine cancer Sister    Alcohol abuse Son    Depression Son    Diabetes Son    Drug abuse Son    Mental illness Son        bipolar    Diabetes Maternal Grandmother    COPD Paternal Grandmother    Cancer Paternal Grandfather        FH colon cancer maternal aunts/uncles    Miscarriages / Stillbirths Sister    Arthritis Son    Arthritis Son    Breast cancer Neg Hx     Social History Social History   Tobacco Use   Smoking status: Former    Packs/day: 0.75    Years: 5.00    Total pack years: 3.75    Types: Cigarettes    Quit date: 07/06/1987    Years since quitting: 34.6   Smokeless tobacco: Never  Vaping Use   Vaping Use: Never used  Substance Use Topics   Alcohol use: Yes    Alcohol/week: 2.0 - 4.0 standard drinks of alcohol    Types: 2 - 4 Cans of beer per week    Comment: wine cooler   Drug use: Never     Allergies   Gluten meal, Lactose, Ranitidine, Amphetamine-dextroamphetamine, Lactose intolerance (gi), Reglan [metoclopramide], Topiramate er, Wheat bran, Soap, and Sulfa antibiotics   Review  of Systems Review of Systems : negative unless otherwise stated in HPI.      Physical Exam Triage Vital Signs ED Triage Vitals  Enc Vitals Group     BP 02/08/22 1246 (!) 140/97  Pulse Rate 02/08/22 1246 81     Resp 02/08/22 1246 16     Temp 02/08/22 1246 98.6 F (37 C)     Temp Source 02/08/22 1246 Oral     SpO2 02/08/22 1246 99 %     Weight --      Height --      Head Circumference --      Peak Flow --      Pain Score 02/08/22 1243 0     Pain Loc --      Pain Edu? --      Excl. in [place]? --    No data found.  Updated Vital Signs BP (!) 140/97 (BP Location: Right Arm)   Pulse 81   Temp 98.6 F (37 C) (Oral)   Resp 16   SpO2 99%   Visual Acuity Right Eye Distance:   Left Eye Distance:   Bilateral Distance:    Right Eye Near:   Left Eye Near:    Bilateral Near:     Physical Exam  GEN: alert, well appearing female, in no acute distress    EYES:   pupils equal and reactive, EOM intact NECK:  supple, normal ROM RESP:  clear to auscultation bilaterally, no increased work of breathing  CVS:   regular rate and rhythm, distal pulses intact, no JVP   EXT:   normal ROM, atraumatic, no edema  NEURO:  speech normal, alert and oriented, cranial nerves II through XII grossly intact, positive Romberg Skin:   warm and dry Psych: Normal affect, appropriate speech and behavior    UC Treatments / Results  Labs (all labs ordered are listed, but only abnormal results are displayed) Labs Reviewed - No data to display  EKG   Radiology No results found.  Procedures Procedures (including critical care time)  Medications Ordered in UC Medications - No data to display  Initial Impression / Assessment and Plan / UC Course  I have reviewed the triage vital signs and the nursing notes.  Pertinent labs & imaging results that were available during my care of the patient were reviewed by me and considered in my medical decision making (see chart for details).      Patient is a 56 year old female with history of hypertension who presents for uncontrolled blood pressures at home.  She has systolics in the 902I-097D.  Her blood pressure is 140/97 here.  States that her primary care doctor is out for maternity leave.  He has not been able to get an appointment at the office.  On chart review, Dr. Brita Romp considered increasing her amlodipine to 10 mg.  She continues to take Maxide  (triamterene-HCTZ ) and amlodipine.  Offered refill of her amlodipine however she declined.  I think increasing her amlodipine is a good plan.  Advised patient to increase her amlodipine to 2 tablets daily.  She is to take a blood pressure first thing in the morning for the next week and take those readings to her primary care office.  Symptoms of hypotension discussed.    She has symptoms of vertigo, on exam.  Her symptoms were reproducible with head movements.  Offered meclizine however she declined at this time.   Return and ED precautions given and patient voiced understanding.    Final Clinical Impressions(s) / UC Diagnoses   Final diagnoses:  Uncontrolled hypertension  Benign paroxysmal positional vertigo, unspecified laterality     Discharge Instructions      Take 2-tablets of Norvasc daily. Follow  up with your primary care doctor in 2 weeks. Take your blood pressure first thing in the morning and keep a record for your doctor. See record sheet attached.      ED Prescriptions   None    PDMP not reviewed this encounter.   Lyndee Hensen, DO 02/08/22 1935

## 2022-02-20 ENCOUNTER — Ambulatory Visit
Admission: EM | Admit: 2022-02-20 | Discharge: 2022-02-20 | Disposition: A | Discharge status: Home / Self Care | Payer: 59 | Attending: Emergency Medicine | Admitting: Emergency Medicine

## 2022-02-20 DIAGNOSIS — U071 COVID-19: Secondary | ICD-10-CM | POA: Diagnosis not present

## 2022-02-20 LAB — RESP PANEL BY RT-PCR (RSV, FLU A&B, COVID)  RVPGX2
Influenza A by PCR: NEGATIVE
Influenza B by PCR: NEGATIVE
Resp Syncytial Virus by PCR: NEGATIVE
SARS Coronavirus 2 by RT PCR: POSITIVE — AB

## 2022-02-20 MED ORDER — IPRATROPIUM BROMIDE 0.06 % NA SOLN: 2.0000 | Freq: Four times a day (QID) | NASAL | 12 refills | Status: DC

## 2022-02-20 MED ORDER — ONDANSETRON 8 MG PO TBDP: 8.0000 mg | ORAL_TABLET | Freq: Three times a day (TID) | ORAL | 0 refills | Status: DC | PRN

## 2022-02-20 MED ORDER — BENZONATATE 100 MG PO CAPS: 200.0000 mg | ORAL_CAPSULE | Freq: Three times a day (TID) | ORAL | 0 refills | Status: DC

## 2022-02-20 MED ORDER — MOLNUPIRAVIR EUA 200MG CAPSULE: 4.0000 | ORAL_CAPSULE | Freq: Two times a day (BID) | ORAL | 0 refills | Status: AC

## 2022-02-20 MED ORDER — PROMETHAZINE-DM 6.25-15 MG/5ML PO SYRP: 5.0000 mL | ORAL_SOLUTION | Freq: Four times a day (QID) | ORAL | 0 refills | Status: DC | PRN

## 2022-02-20 NOTE — Discharge Instructions (Addendum)
You have tested positive for COVID-19 today.  You will need to quarantine for 5 days from onset of your symptoms.  After 5 days you can break quarantine if your symptoms have improved and you have not had a fever for 24 hours without taking Tylenol or ibuprofen.  You will still need to wear a mask around others for additional 5 days.  Use over-the-counter Tylenol and ibuprofen according to package instructions as needed for headache and fever.  Take molnupiravir twice daily for 5 days for treatment of COVID-19.  Use the Atrovent nasal spray, 2 squirts up each nostril every 6 hours as needed for nasal congestion and runny nose.  Use the Tessalon Perles during the day as needed for cough.  They may give you a metallic taste in mouth and numbness to the base of your tongue, this is normal.  Use the Promethazine DM cough syrup at bedtime as needed for cough and congestion.  This should also be with nausea.  Use the Zofran every 8 hours during the day as needed for nausea and vomiting.  If you develop any shortness of breath, especially at rest, feel you cannot catch her breath, you are unable to speak in full sentences, or is a late sign your lips are turning blue you need to call 911 and go to the ER.

## 2022-02-20 NOTE — ED Provider Notes (Signed)
[facility]    CSN: [id number] Arrival date & time: 02/20/22  1417      History   Chief Complaint Chief Complaint  Patient presents with   Fever   Generalized Body Aches   Chills   Emesis    HPI Elizabeth Bowers is a 56 y.o. female.   HPI  56 year old female here for evaluation of cold symptoms.  Patient reports that her symptoms started 3 days ago and consist of a fever, just over 100, headache, nausea, vomiting, and chills.  She states that she developed diarrhea today.  She does have a cough that is intermittently productive for yellow sputum.  She does have runny nose nasal congestion but states that that is at baseline for her.  She denies sore throat.  She also denies sick contacts.  Past Medical History:  Diagnosis Date   Fatty liver    Fibromyalgia    GERD (gastroesophageal reflux disease)    Hemorrhoids    Hot flashes    Hypertension    IBS (irritable bowel syndrome)    LUQ abdominal pain 10/25/2016   Migraines    Miscarriage    x 2; 4 live births    Multilevel degenerative disc disease    Neuroma    Vocal cord dysfunction     Patient Active Problem List   Diagnosis Date Noted   Chronic hip pain (Right) 08/16/2021   Impaired range of motion of hip (Right) 08/16/2021   Chronic lower extremity pain (Right) 08/16/2021   Lower extremity radicular pain (Right) 08/16/2021   Lumbar radiculitis (L3/L4) (Right) 08/16/2021   Morton's neuroma of foot (Left) 11/15/2020   MDD (major depressive disorder) 10/01/2020   Anterior tibialis tendinitis, right 04/28/2020   Insomnia 07/07/2019   Benign essential hypertension 04/22/2019   Radial styloid tenosynovitis of left hand 01/13/2019   Pain in right hand 01/13/2019   Bilateral carpal tunnel syndrome 01/13/2019   Chronic pain of left wrist 01/13/2019   Chronic low back pain (Bilateral) w/o sciatica 08/08/2018   Abnormal MRI, cervical spine (07/10/2018) 07/17/2018   Cervical spondylitis with radiculitis  (HCC) (C6) (Bilateral) (R>L) 06/26/2018   Chronic sacroiliac joint pain (Bilateral) (R>L) 06/26/2018   Cervical Grade 1 (2 mm) Retrolisthesis C5 over C6 03/20/2018   Cervical foraminal stenosis (C3-4 & C5-6) (Bilateral) 03/20/2018   DDD (degenerative disc disease), cervical 03/20/2018   Long term prescription benzodiazepine use 03/20/2018   Stage 3 chronic kidney disease ([place]) 03/20/2018   Acid reflux disease 03/20/2018   Spondylosis without myelopathy or radiculopathy, cervical region 03/20/2018   Chronic hip pain (Bilateral) (L>R) 03/20/2018   Lumbar facet syndrome (Bilateral) (R>L) 03/20/2018   Spondylosis without myelopathy or radiculopathy, lumbar region 03/20/2018   Chronic Sacroiliac joint dysfunction (Bilateral) (R>L) 03/20/2018   Chronic Somatic dysfunction of sacroiliac joint (Bilateral) (R>L) 03/20/2018   Osteoarthritis of hip (Bilateral) 03/20/2018   Cervicogenic headache (Bilateral) 03/20/2018   Cervicalgia (Bilateral) (R>L) 03/20/2018   Cervical facet syndrome (Bilateral) (R>L) 03/20/2018   Chronic occipital neuralgia (Bilateral) 03/20/2018   Chronic upper extremity pain (Fourth Area of Pain) (Bilateral) (R>L) 03/19/2018   Ganglion cyst 02/20/2018   Chronic low back pain (2ry area of Pain) (Bilateral) (L>R) w/ sciatica (Bilateral) 01/16/2018   Chronic lower extremity pain (3ry area of Pain) (Bilateral) (L>R) 01/16/2018   Chronic neck pain (1ry area of Pain) (Bilateral) (midline) 01/16/2018   Chronic foot pain (Left) 01/16/2018   Chronic pain syndrome 01/16/2018   Pharmacologic therapy 01/16/2018   Disorder  of skeletal system 01/16/2018   Problems influencing health status 01/16/2018   Class 1 obesity with serious comorbidity and body mass index (BMI) of 32.0 to 32.9 in adult 11/21/2017   Anxiety 09/28/2017   Chronic knee pain (Right) 09/28/2017   Fatty liver 07/09/2017   Colon polyps 07/09/2017   Fibromyalgia 07/09/2017   Sinusitis, chronic 07/09/2017   Allergic  rhinitis 07/09/2017   IBS (irritable bowel syndrome) 07/09/2017   Eyelid dermatitis, allergic/contact 07/09/2017   Superficial thrombophlebitis 01/12/2017   Chronic sacroiliac joint pain (Right) 12/16/2016   DDD (degenerative disc disease), lumbar 07/18/2016   Family hx of colon cancer 07/18/2016    Past Surgical History:  Procedure Laterality Date   ABDOMINAL HYSTERECTOMY     2012 removed cervix h/o abnormal pap    CHOLECYSTECTOMY     2000   COLONOSCOPY     2018 with + polpys and GIB 2/2 polyp removal    COLONOSCOPY WITH PROPOFOL N/A 12/22/2019   Procedure: COLONOSCOPY WITH PROPOFOL;  Surgeon: Lin Landsman, MD;  Location: [facility] ENDOSCOPY;  Service: Gastroenterology;  Laterality: N/A;   COLONOSCOPY WITH PROPOFOL N/A 12/23/2019   Procedure: COLONOSCOPY WITH PROPOFOL;  Surgeon: Lin Landsman, MD;  Location: [facility] ENDOSCOPY;  Service: Gastroenterology;  Laterality: N/A;  Last name pronounced LEE-MA   ELBOW DEBRIDEMENT     ESOPHAGOGASTRODUODENOSCOPY (EGD) WITH PROPOFOL N/A 02/06/2018   Procedure: ESOPHAGOGASTRODUODENOSCOPY (EGD) WITH PROPOFOL with biopsies;  Surgeon: Lin Landsman, MD;  Location: [place];  Service: Endoscopy;  Laterality: N/A;   ETHMOIDECTOMY N/A 09/28/2021   Procedure: TOTAL ETHMOIDECTOMY;  Surgeon: Carloyn Manner, MD;  Location: [place];  Service: ENT;  Laterality: N/A;   FOOT SURGERY     IMAGE GUIDED SINUS SURGERY N/A 09/28/2021   Procedure: IMAGE GUIDED SINUS SURGERY;  Surgeon: Carloyn Manner, MD;  Location: [place];  Service: ENT;  Laterality: N/A;  PLACED DISK ON OR CHARGE NURSE DESK 4-21  KP   MAXILLARY ANTROSTOMY Bilateral 09/28/2021   Procedure: MAXILLARY ANTROSTOMY WITHOUT TISSUE REMOVAL;  Surgeon: Carloyn Manner, MD;  Location: [place];  Service: ENT;  Laterality: Bilateral;   SPHENOIDECTOMY Bilateral 09/28/2021   Procedure: SPHENOIDOTOMY;  Surgeon: Carloyn Manner, MD;  Location: [place];  Service: ENT;  Laterality: Bilateral;    OB History   No obstetric history on file.      Home Medications    Prior to Admission medications   Medication Sig Start Date End Date Taking? Authorizing Provider  amLODipine (NORVASC) 5 MG tablet Take 1 tablet (5 mg total) by mouth daily. 01/17/22  Yes Gwyneth Sprout, FNP  benzonatate (TESSALON) 100 MG capsule Take 2 capsules (200 mg total) by mouth every 8 (eight) hours. 02/20/22  Yes Margarette Canada, NP  budesonide (PULMICORT) 0.5 MG/2ML nebulizer solution One BID as directed 10/19/21  Yes   buPROPion (WELLBUTRIN XL) 300 MG 24 hr tablet Take 1 tablet (300 mg total) by mouth daily. 01/17/22  Yes Tally Joe T, FNP  DULoxetine (CYMBALTA) 60 MG capsule TAKE 1 CAPSULE BY MOUTH DAILY. 11/03/21 11/03/22 Yes Bacigalupo, [name], MD  fluticasone (FLONASE) 50 MCG/ACT nasal spray Place 2 sprays into both nostrils daily. 06/22/21  Yes Covington, Judson Roch M, PA-C  ipratropium (ATROVENT) 0.06 % nasal spray Place 2 sprays into both nostrils 4 (four) times daily. 02/20/22  Yes Margarette Canada, NP  lansoprazole (PREVACID) 30 MG capsule TAKE 1 CAPSULE BY MOUTH TWICE DAILY BEFORE A MEAL. 01/17/22 01/17/23 Yes Gwyneth Sprout,  FNP  LINZESS 290 MCG CAPS capsule TAKE 1 CAPSULE BY MOUTH DAILY. 12/27/21 12/27/22 Yes Drubel, Ria Comment, PA-C  molnupiravir EUA (LAGEVRIO) 200 mg CAPS capsule Take 4 capsules (800 mg total) by mouth 2 (two) times daily for 5 days. 02/20/22 02/25/22 Yes Margarette Canada, NP  Multiple Vitamin (MULTIVITAMIN WITH MINERALS) TABS tablet Take 1 tablet by mouth daily.   Yes [provider]  ondansetron (ZOFRAN-ODT) 8 MG disintegrating tablet Take 1 tablet (8 mg total) by mouth every 8 (eight) hours as needed for nausea or vomiting. 02/20/22  Yes Margarette Canada, NP  promethazine-dextromethorphan (PROMETHAZINE-DM) 6.25-15 MG/5ML syrup Take 5 mLs by mouth 4 (four) times daily as needed. 02/20/22  Yes Margarette Canada, NP  psyllium (METAMUCIL) 58.6 % powder Take 2  packets by mouth daily.   Yes [provider]  triamterene-hydrochlorothiazide (MAXZIDE-25) 37.5-25 MG tablet TAKE 1 TABLET BY MOUTH DAILY. 12/07/21 12/07/22 Yes Myles Gip, DO  vitamin C (ASCORBIC ACID) 500 MG tablet Take 500 mg by mouth daily.   Yes [provider]    Family History Family History  Problem Relation Age of Onset   Hypertension Mother    Hyperlipidemia Mother    Glaucoma Mother    Diabetes Mother        type 2    COPD Father        emphysema   Alcohol abuse Father    Colon cancer Sister    Cancer Sister        rectal/colon cancer no h/o IBD   Colon polyps Sister    Asthma Sister    COPD Sister    Kidney disease Sister    Mental illness Sister        bipolar    Miscarriages / [place] Sister    Uterine cancer Sister    Alcohol abuse Son    Depression Son    Diabetes Son    Drug abuse Son    Mental illness Son        bipolar    Diabetes Maternal Grandmother    COPD Paternal Grandmother    Cancer Paternal Grandfather        FH colon cancer maternal aunts/uncles    Miscarriages / Stillbirths Sister    Arthritis Son    Arthritis Son    Breast cancer Neg Hx     Social History Social History   Tobacco Use   Smoking status: Former    Packs/day: 0.75    Years: 5.00    Total pack years: 3.75    Types: Cigarettes    Quit date: 07/06/1987    Years since quitting: 34.6   Smokeless tobacco: Never  Vaping Use   Vaping Use: Never used  Substance Use Topics   Alcohol use: Yes    Alcohol/week: 2.0 - 4.0 standard drinks of alcohol    Types: 2 - 4 Cans of beer per week    Comment: wine cooler   Drug use: Never     Allergies   Gluten meal, Lactose, Ranitidine, Amphetamine-dextroamphetamine, Lactose intolerance (gi), Reglan [metoclopramide], Topiramate er, Wheat bran, Soap, and Sulfa antibiotics   Review of Systems Review of Systems  Constitutional:  Positive for fever.  HENT:  Positive for congestion and rhinorrhea. Negative  for sore throat.   Respiratory:  Positive for cough.   Gastrointestinal:  Positive for diarrhea, nausea and vomiting. Negative for abdominal pain.  Neurological:  Positive for headaches.  Hematological: Negative.   Psychiatric/Behavioral: Negative.  Physical Exam Triage Vital Signs ED Triage Vitals  Enc Vitals Group     BP 02/20/22 1516 108/72     Pulse Rate 02/20/22 1516 88     Resp --      Temp 02/20/22 1516 99.2 F (37.3 C)     Temp Source 02/20/22 1516 Oral     SpO2 02/20/22 1516 97 %     Weight 02/20/22 1513 170 lb (77.1 kg)     Height 02/20/22 1513 '5\' 2"'$  (1.575 m)     Head Circumference --      Peak Flow --      Pain Score 02/20/22 1513 0     Pain Loc --      Pain Edu? --      Excl. in [place]? --    No data found.  Updated Vital Signs BP 108/72 (BP Location: Right Arm)   Pulse 88   Temp 99.2 F (37.3 C) (Oral)   Ht '5\' 2"'$  (1.575 m)   Wt 170 lb (77.1 kg)   SpO2 97%   BMI 31.09 kg/m   Visual Acuity Right Eye Distance:   Left Eye Distance:   Bilateral Distance:    Right Eye Near:   Left Eye Near:    Bilateral Near:     Physical Exam Vitals and nursing note reviewed.  Constitutional:      Appearance: Normal appearance. She is ill-appearing.  HENT:     Head: Normocephalic and atraumatic.     Right Ear: Tympanic membrane, ear canal and external ear normal. There is no impacted cerumen.     Left Ear: Tympanic membrane, ear canal and external ear normal. There is no impacted cerumen.     Nose: Congestion and rhinorrhea present.     Mouth/Throat:     Mouth: Mucous membranes are moist.     Pharynx: Oropharynx is clear. No oropharyngeal exudate or posterior oropharyngeal erythema.  Cardiovascular:     Rate and Rhythm: Normal rate and regular rhythm.     Pulses: Normal pulses.     Heart sounds: Normal heart sounds. No murmur heard.    No friction rub. No gallop.  Pulmonary:     Effort: Pulmonary effort is normal.     Breath sounds: Normal breath sounds.  No wheezing, rhonchi or rales.  Abdominal:     General: Abdomen is flat.     Palpations: Abdomen is soft.     Tenderness: There is no abdominal tenderness. There is no guarding or rebound.  Musculoskeletal:     Cervical back: Normal range of motion and neck supple.  Lymphadenopathy:     Cervical: No cervical adenopathy.  Skin:    General: Skin is warm and dry.     Capillary Refill: Capillary refill takes less than 2 seconds.     Findings: No erythema or rash.  Neurological:     General: No focal deficit present.     Mental Status: She is alert and oriented to person, place, and time.  Psychiatric:        Mood and Affect: Mood normal.        Behavior: Behavior normal.        Thought Content: Thought content normal.        Judgment: Judgment normal.      UC Treatments / Results  Labs (all labs ordered are listed, but only abnormal results are displayed) Labs Reviewed  RESP PANEL BY RT-PCR (RSV, FLU A&B, COVID)  [id number] - Abnormal; Notable for  the following components:      Result Value   SARS Coronavirus 2 by RT PCR POSITIVE (*)    All other components within normal limits  RESP PANEL BY RT-PCR (FLU A&B, COVID) [id number]    EKG   Radiology No results found.  Procedures Procedures (including critical care time)  Medications Ordered in UC Medications - No data to display  Initial Impression / Assessment and Plan / UC Course  I have reviewed the triage vital signs and the nursing notes.  Pertinent labs & imaging results that were available during my care of the patient were reviewed by me and considered in my medical decision making (see chart for details).   Patient is a pleasant though mildly ill-appearing 56 year old female here for evaluation of fever, headache, chills, cough, nausea, vomiting, diarrhea that all started 3 days ago.  Patient reports a fever of just over 100.  She states that she has been able to take and keep down water but no solid food.  She did  develop diarrhea today but has not had it until now.  She is concerned she might have COVID, RSV, or flu and would like to be tested for all 3.  Her physical exam reveals pearly-gray tympanic membranes bilaterally with normal reflex and clear external auditory canals.  Nasal mucosa is erythematous and mildly edematous with clear discharge in both nares.  Oropharyngeal exam is benign.  No cervical of adenopathy appreciable exam.  Cardiopulmonary exam reveals S1-S2 heart sounds with regular rate and rhythm and lung sounds adequate auscultation all fields.  Abdomen is soft, flat, and nontender to palpation.  I will order respiratory triplex panel and have explained to the patient that she is outside the window for treatment of flu.  There is no treatment for RSV.  She is within the window for antiviral therapy if she is COVID-positive.  Respiratory panel is positive for COVID negative for influenza or RSV.  I will discharge patient home on molnupiravir twice daily for 5 days for treatment of COVID-19.  I will also prescribe Tessalon Perles and Promethazine DM cough syrup.  Zofran as needed for nausea.  Add her nasal spray to help with runny nose nasal congestion.  Tylenol or Profen as needed for headache pain.  Work note provided to cover patient for the quarantine duration.   Final Clinical Impressions(s) / UC Diagnoses   Final diagnoses:  [id number]     Discharge Instructions      You have tested positive for COVID-19 today.  You will need to quarantine for 5 days from onset of your symptoms.  After 5 days you can break quarantine if your symptoms have improved and you have not had a fever for 24 hours without taking Tylenol or ibuprofen.  You will still need to wear a mask around others for additional 5 days.  Use over-the-counter Tylenol and ibuprofen according to package instructions as needed for headache and fever.  Take molnupiravir twice daily for 5 days for treatment of COVID-19.  Use the  Atrovent nasal spray, 2 squirts up each nostril every 6 hours as needed for nasal congestion and runny nose.  Use the Tessalon Perles during the day as needed for cough.  They may give you a metallic taste in mouth and numbness to the base of your tongue, this is normal.  Use the Promethazine DM cough syrup at bedtime as needed for cough and congestion.  This should also be with nausea.  Use the Zofran every  8 hours during the day as needed for nausea and vomiting.  If you develop any shortness of breath, especially at rest, feel you cannot catch her breath, you are unable to speak in full sentences, or is a late sign your lips are turning blue you need to call 911 and go to the ER.     ED Prescriptions     Medication Sig Dispense Auth. Provider   benzonatate (TESSALON) 100 MG capsule Take 2 capsules (200 mg total) by mouth every 8 (eight) hours. 21 capsule Margarette Canada, NP   ipratropium (ATROVENT) 0.06 % nasal spray Place 2 sprays into both nostrils 4 (four) times daily. 15 mL Margarette Canada, NP   promethazine-dextromethorphan (PROMETHAZINE-DM) 6.25-15 MG/5ML syrup Take 5 mLs by mouth 4 (four) times daily as needed. 118 mL Margarette Canada, NP   molnupiravir EUA (LAGEVRIO) 200 mg CAPS capsule Take 4 capsules (800 mg total) by mouth 2 (two) times daily for 5 days. 40 capsule Margarette Canada, NP   ondansetron (ZOFRAN-ODT) 8 MG disintegrating tablet Take 1 tablet (8 mg total) by mouth every 8 (eight) hours as needed for nausea or vomiting. 20 tablet Margarette Canada, NP      PDMP not reviewed this encounter.   Margarette Canada, NP 02/20/22 1610

## 2022-02-20 NOTE — ED Triage Notes (Addendum)
Pt presents with complaints of fever x couple days, chills, nausea & emesis. Denies any diarrhea, no appetite. Pt requesting to be tested for covid/rsv/flu.

## 2022-02-24 ENCOUNTER — Other Ambulatory Visit (HOSPITAL_COMMUNITY): Payer: Self-pay

## 2022-02-27 ENCOUNTER — Other Ambulatory Visit (HOSPITAL_COMMUNITY): Payer: Self-pay

## 2022-02-28 ENCOUNTER — Other Ambulatory Visit (HOSPITAL_COMMUNITY): Payer: Self-pay

## 2022-03-25 ENCOUNTER — Other Ambulatory Visit: Payer: Self-pay | Admitting: Physician Assistant

## 2022-03-25 DIAGNOSIS — K581 Irritable bowel syndrome with constipation: Secondary | ICD-10-CM

## 2022-03-27 ENCOUNTER — Other Ambulatory Visit (HOSPITAL_COMMUNITY): Payer: Self-pay

## 2022-03-27 MED ORDER — LINZESS 290 MCG PO CAPS
290.0000 ug | ORAL_CAPSULE | Freq: Every day | ORAL | 0 refills | Status: DC
  Filled 2022-03-27: qty 90, 90d supply, fill #0

## 2022-03-28 ENCOUNTER — Other Ambulatory Visit (HOSPITAL_COMMUNITY): Payer: Self-pay

## 2022-03-31 ENCOUNTER — Telehealth: Payer: 59 | Admitting: Pain Medicine

## 2022-04-03 NOTE — Progress Notes (Unsigned)
PROVIDER NOTE: Information contained herein reflects review and annotations entered in association with encounter. Interpretation of such information and data should be left to medically-trained personnel. Information provided to patient can be located elsewhere in the medical record under "Patient Instructions". Document created using STT-dictation technology, any transcriptional errors that may result from process are unintentional.    Patient: Elizabeth Bowers  Service Category: E/M  Provider: Gaspar Cola, MD  DOB: 16-Apr-1966  DOS: 04/04/2022  Referring Provider: Virginia Crews, MD  MRN: [id number]  Specialty: Interventional Pain Management  PCP: Virginia Crews, MD  Type: Established Patient  Setting: Ambulatory outpatient    Location: Office  Delivery: Face-to-face     HPI  Elizabeth Bowers, a 56 y.o. year old female, is here today because of her Chronic neck pain [M54.2, G89.29]. Elizabeth Bowers complain today is No chief complaint on file. Last encounter: My last encounter with her was on Visit date not found. Pertinent problems: Elizabeth Bowers has Chronic sacroiliac joint pain (Right); Fibromyalgia; Chronic knee pain (Right); DDD (degenerative disc disease), lumbar; Chronic low back pain (2ry area of Pain) (Bilateral) (L>R) w/ sciatica (Bilateral); Chronic lower extremity pain (3ry area of Pain) (Bilateral) (L>R); Chronic neck pain (1ry area of Pain) (Bilateral) (midline); Chronic foot pain (Left); Chronic pain syndrome; Ganglion cyst; Chronic upper extremity pain (4th area of Pain) (Bilateral) (R>L); Cervical Grade 1 (2 mm) Retrolisthesis C5 over C6; Cervical foraminal stenosis (C3-4 & C5-6) (Bilateral); DDD (degenerative disc disease), cervical; Spondylosis without myelopathy or radiculopathy, cervical region; Chronic hip pain (Bilateral) (L>R); Lumbar facet syndrome (Bilateral) (R>L); Spondylosis without myelopathy or radiculopathy, lumbar region; Chronic Sacroiliac joint  dysfunction (Bilateral) (R>L); Chronic Somatic dysfunction of sacroiliac joint (Bilateral) (R>L); Osteoarthritis of hip (Bilateral); Cervicogenic headache (Bilateral); Cervicalgia (Bilateral) (R>L); Cervical facet syndrome (Bilateral) (R>L); Chronic occipital neuralgia (Bilateral); Cervical spondylitis with radiculitis (HCC) (C6) (Bilateral) (R>L); Chronic sacroiliac joint pain (Bilateral) (R>L); Abnormal MRI, cervical spine (07/10/2018); Chronic low back pain (Bilateral) w/o sciatica; Morton's neuroma of foot (Left); Chronic hip pain (Right); Impaired range of motion of hip (Right); Chronic lower extremity pain (Right); Lower extremity radicular pain (Right); and Lumbar radiculitis (L3/L4) (Right) on their pertinent problem list. Pain Assessment: Severity of   is reported as a  /10. Location:    / . Onset:  . Quality:  . Timing:  . Modifying factor(s):  Marland Kitchen Vitals:  vitals were not taken for this visit.   Reason for encounter: evaluation of worsening, or previously known (established) problem. ***  Pharmacotherapy Assessment  Analgesic: No chronic opioid analgesics therapy prescribed by our practice.   Monitoring: [place] PMP: PDMP reviewed during this encounter.       Pharmacotherapy: No side-effects or adverse reactions reported. Compliance: No problems identified. Effectiveness: Clinically acceptable.  No notes on file  No results found for: "CBDTHCR" No results found for: "D8THCCBX" No results found for: "D9THCCBX"  UDS:  Summary  Date Value Ref Range Status  01/16/2018 FINAL  Final    Comment:    ==================================================================== [facility] COMP DRUG ANALYSIS,UR ==================================================================== Test                             Result       Flag       Units Drug Present and Declared for Prescription Verification   Oxazepam  51           EXPECTED   ng/mg creat   Temazepam                      44            EXPECTED   ng/mg creat    Oxazepam and temazepam are expected metabolites of diazepam.    Oxazepam is also an expected metabolite of other benzodiazepine    drugs, including chlordiazepoxide, prazepam, clorazepate,    halazepam, and temazepam.  Oxazepam and temazepam are available    as scheduled prescription medications.   Phentermine                    PRESENT      EXPECTED   Duloxetine                     PRESENT      EXPECTED Drug Present not Declared for Prescription Verification   Topiramate                     PRESENT      UNEXPECTED   Acetaminophen                  PRESENT      UNEXPECTED Drug Absent but Declared for Prescription Verification   Cyclobenzaprine                Not Detected UNEXPECTED   Promethazine                   Not Detected UNEXPECTED ==================================================================== Test                      Result    Flag   Units      Ref Range   Creatinine              57               mg/dL      >=20 ==================================================================== Declared Medications:  The flagging and interpretation on this report are based on the  following declared medications.  Unexpected results may arise from  inaccuracies in the declared medications.  **Note: The testing scope of this panel includes these medications:  Cyclobenzaprine  Diazepam  Duloxetine  Phentermine  Promethazine  **Note: The testing scope of this panel does not include following  reported medications:  Acyclovir  Budesonide  Docusate  Hydrochlorothiazide (Triamterine-Hydrochlorthzide)  Levocetirizine  Linaclotide  Meclizine  Montelukast  Multivitamin  Potassium  Triamterene (Triamterine-Hydrochlorthzide)  Valacyclovir ==================================================================== For clinical consultation, please call [phone]. ====================================================================       ROS   Constitutional: Denies any fever or chills Gastrointestinal: No reported hemesis, hematochezia, vomiting, or acute GI distress Musculoskeletal: Denies any acute onset joint swelling, redness, loss of ROM, or weakness Neurological: No reported episodes of acute onset apraxia, aphasia, dysarthria, agnosia, amnesia, paralysis, loss of coordination, or loss of consciousness  Medication Review  DULoxetine, amLODipine, ascorbic acid, benzonatate, buPROPion, budesonide, fluticasone, ipratropium, lansoprazole, linaclotide, multivitamin with minerals, ondansetron, promethazine-dextromethorphan, psyllium, and triamterene-hydrochlorothiazide  History Review  Allergy: Elizabeth Bowers is allergic to gluten meal, lactose, ranitidine, amphetamine-dextroamphetamine, lactose intolerance (gi), reglan [metoclopramide], topiramate er, wheat bran, soap, and sulfa antibiotics. Drug: Elizabeth Bowers  reports no history of drug use. Alcohol:  reports current alcohol use of about 2.0 - 4.0 standard drinks of alcohol per week. Tobacco:  reports that  she quit smoking about 34 years ago. Her smoking use included cigarettes. She has a 3.75 pack-year smoking history. She has never used smokeless tobacco. Social: Elizabeth Bowers  reports that she quit smoking about 34 years ago. Her smoking use included cigarettes. She has a 3.75 pack-year smoking history. She has never used smokeless tobacco. She reports current alcohol use of about 2.0 - 4.0 standard drinks of alcohol per week. She reports that she does not use drugs. Medical:  has a past medical history of Fatty liver, Fibromyalgia, GERD (gastroesophageal reflux disease), Hemorrhoids, Hot flashes, Hypertension, IBS (irritable bowel syndrome), LUQ abdominal pain (10/25/2016), Migraines, Miscarriage, Multilevel degenerative disc disease, Neuroma, and Vocal cord dysfunction. Surgical: Elizabeth Bowers  has a past surgical history that includes Elbow Debridement; Foot surgery; Cholecystectomy; Colonoscopy;  Abdominal hysterectomy; Esophagogastroduodenoscopy (egd) with propofol (N/A, 02/06/2018); Colonoscopy with propofol (N/A, 12/22/2019); Colonoscopy with propofol (N/A, 12/23/2019); Image guided sinus surgery (N/A, 09/28/2021); Maxillary antrostomy (Bilateral, 09/28/2021); Ethmoidectomy (N/A, 09/28/2021); and Sphenoidectomy (Bilateral, 09/28/2021). Family: family history includes Alcohol abuse in her father and son; Arthritis in her son and son; Asthma in her sister; COPD in her father, paternal grandmother, and sister; Cancer in her paternal grandfather and sister; Colon cancer in her sister; Colon polyps in her sister; Depression in her son; Diabetes in her maternal grandmother, mother, and son; Drug abuse in her son; Glaucoma in her mother; Hyperlipidemia in her mother; Hypertension in her mother; Kidney disease in her sister; Mental illness in her sister and son; Miscarriages / Stillbirths in her sister and sister; Uterine cancer in her sister.  Laboratory Chemistry Profile   Renal Lab Results  Component Value Date   BUN 10 09/23/2021   CREATININE 1.21 (H) 09/23/2021   BCR 8 (L) 09/23/2021   GFRAA >60 07/07/2019   GFRNONAA 56 (L) 05/05/2021    Hepatic Lab Results  Component Value Date   AST 27 09/23/2021   ALT 38 (H) 09/23/2021   ALBUMIN 4.5 09/23/2021   ALKPHOS 101 09/23/2021   LIPASE 33 12/15/2016    Electrolytes Lab Results  Component Value Date   NA 139 09/23/2021   K 3.8 09/23/2021   CL 98 09/23/2021   CALCIUM 9.8 09/23/2021   MG 2.2 01/16/2018    Bone Lab Results  Component Value Date   25OHVITD1 51 01/16/2018   25OHVITD2 <1.0 01/16/2018   25OHVITD3 50 01/16/2018    Inflammation (CRP: Acute Phase) (ESR: Chronic Phase) Lab Results  Component Value Date   CRP 0.8 07/05/2021   ESRSEDRATE 17 07/05/2021         Note: Above Lab results reviewed.  Recent Imaging Review  MM 3D SCREEN BREAST BILATERAL CLINICAL DATA:  Screening.  EXAM: DIGITAL SCREENING BILATERAL MAMMOGRAM  WITH TOMOSYNTHESIS AND CAD  TECHNIQUE: Bilateral screening digital craniocaudal and mediolateral oblique mammograms were obtained. Bilateral screening digital breast tomosynthesis was performed. The images were evaluated with computer-aided detection.  COMPARISON:  Previous exam(s).  ACR Breast Density Category b: There are scattered areas of fibroglandular density.  FINDINGS: There are no findings suspicious for malignancy.  IMPRESSION: No mammographic evidence of malignancy. A result letter of this screening mammogram will be mailed directly to the patient.  RECOMMENDATION: Screening mammogram in one year. (Code:SM-B-01Y)  BI-RADS CATEGORY  1: Negative.  Electronically Signed   By: Valentino Saxon M.D.   On: 10/14/2021 13:32 Note: Reviewed        Physical Exam  General appearance: Well nourished, well developed, and well hydrated. In no apparent acute  distress Mental status: Alert, oriented x 3 (person, place, & time)       Respiratory: No evidence of acute respiratory distress Eyes: PERLA Vitals: There were no vitals taken for this visit. BMI: Estimated body mass index is 31.09 kg/m as calculated from the following:   Height as of 02/20/22: 5' 2" (1.575 m).   Weight as of 02/20/22: 170 lb (77.1 kg). Ideal: Patient weight not recorded  Assessment   Diagnosis Status  1. Chronic neck pain (1ry area of Pain) (Bilateral) (midline)   2. Chronic upper extremity pain (4th area of Pain) (Bilateral) (R>L)   3. Cervical foraminal stenosis (C3-4 & C5-6) (Bilateral)   4. Cervical Grade 1 (2 mm) Retrolisthesis C5 over C6   5. Cervical spondylitis with radiculitis (HCC) (C6) (Bilateral) (R>L)   6. Cervicalgia (Bilateral) (R>L)   7. Abnormal MRI, cervical spine (07/10/2018)    Controlled Controlled Controlled   Updated Problems: Problem  Chronic upper extremity pain (4th area of Pain) (Bilateral) (R>L)     Plan of Care  Problem-specific:  No problem-specific  Assessment & Plan notes found for this encounter.  Elizabeth Bowers has a current medication list which includes the following long-term medication(s): amlodipine, budesonide, bupropion, duloxetine, fluticasone, ipratropium, lansoprazole, linzess, and triamterene-hydrochlorothiazide.  Pharmacotherapy (Medications Ordered): No orders of the defined types were placed in this encounter.  Orders:  No orders of the defined types were placed in this encounter.  Follow-up plan:   No follow-ups on file.     Interventional Therapies  Risk  Complexity Considerations:   Estimated body mass index is 32.01 kg/m as calculated from the following:   Height as of this encounter: 5' 2" (1.575 m).   Weight as of this encounter: 175 lb (79.4 kg). WNL   Planned  Pending:      Under consideration:      Completed:   Diagnostic/therapeutic right L3-4 LESI x1 (09/08/2021) (100/100/100/100)  Palliative bilateral cervical facet MBB x2 (06/13/2018) (100/100/0/<25)  Diagnostic bilateral lumbar facet MBB x2 (08/08/2018) [facility])  Diagnostic right Cervical ESI x1 (06/27/2018) (100/20/70/50-75)  Therapeutic right cervical facet RFA x1 (07/23/2018) (100/50/0)    Completed by other providers:   Therapeutic sphenopalatine ganglion Blk by Dr. Brigitte Pulse x2 (03/04/2018) Diagnostic bilateral upper extremity EMG/PNCV by Dr. Brigitte Pulse: Right Grade II & Left Grade I CTS (08/23/2018)    Therapeutic  Palliative (PRN) options:   Therapeutic/palliative cervical facet MBB   Therapeutic/palliativelumbar facet MBB   Therapeutic/palliative Cervical ESI   Therapeutic/palliative cervical facet RFA       Recent Visits No visits were found meeting these conditions. Showing recent visits within past 90 days and meeting all other requirements Future Appointments Date Type Provider Dept  04/04/22 Appointment Milinda Pointer, MD [facility]  Showing future appointments within next 90 days and meeting all other  requirements  I discussed the assessment and treatment plan with the patient. The patient was provided an opportunity to ask questions and all were answered. The patient agreed with the plan and demonstrated an understanding of the instructions.  Patient advised to call back or seek an in-person evaluation if the symptoms or condition worsens.  Duration of encounter: *** minutes.  Total time on encounter, as per AMA guidelines included both the face-to-face and non-face-to-face time personally spent by the physician and/or other qualified health care professional(s) on the day of the encounter (includes time in activities that require the physician or other qualified health care professional and does not include time  in activities normally performed by clinical staff). Physician's time may include the following activities when performed: preparing to see the patient (eg, review of tests, pre-charting review of records) obtaining and/or reviewing separately obtained history performing a medically appropriate examination and/or evaluation counseling and educating the patient/family/caregiver ordering medications, tests, or procedures referring and communicating with other health care professionals (when not separately reported) documenting clinical information in the electronic or other health record independently interpreting results (not separately reported) and communicating results to the patient/ family/caregiver care coordination (not separately reported)  Note by: Gaspar Cola, MD Date: 04/04/2022; Time: 3:54 PM

## 2022-04-04 ENCOUNTER — Ambulatory Visit: Payer: 59 | Attending: Pain Medicine | Admitting: Pain Medicine

## 2022-04-04 ENCOUNTER — Encounter: Payer: Self-pay | Admitting: Pain Medicine

## 2022-04-04 ENCOUNTER — Other Ambulatory Visit: Payer: Self-pay

## 2022-04-04 VITALS — BP 122/88 | HR 89 | Temp 97.1°F | Resp 16 | Ht 62.0 in | Wt 175.0 lb

## 2022-04-04 DIAGNOSIS — M5030 Other cervical disc degeneration, unspecified cervical region: Secondary | ICD-10-CM | POA: Diagnosis not present

## 2022-04-04 DIAGNOSIS — R937 Abnormal findings on diagnostic imaging of other parts of musculoskeletal system: Secondary | ICD-10-CM

## 2022-04-04 DIAGNOSIS — M4692 Unspecified inflammatory spondylopathy, cervical region: Secondary | ICD-10-CM

## 2022-04-04 DIAGNOSIS — M4310 Spondylolisthesis, site unspecified: Secondary | ICD-10-CM | POA: Diagnosis not present

## 2022-04-04 DIAGNOSIS — M4802 Spinal stenosis, cervical region: Secondary | ICD-10-CM

## 2022-04-04 DIAGNOSIS — M5412 Radiculopathy, cervical region: Secondary | ICD-10-CM | POA: Diagnosis not present

## 2022-04-04 DIAGNOSIS — M542 Cervicalgia: Secondary | ICD-10-CM

## 2022-04-04 DIAGNOSIS — M79602 Pain in left arm: Secondary | ICD-10-CM | POA: Diagnosis not present

## 2022-04-04 DIAGNOSIS — G8929 Other chronic pain: Secondary | ICD-10-CM

## 2022-04-04 DIAGNOSIS — M79601 Pain in right arm: Secondary | ICD-10-CM | POA: Insufficient documentation

## 2022-04-04 MED ORDER — PREDNISONE 20 MG PO TABS
ORAL_TABLET | ORAL | 0 refills | Status: AC
  Filled 2022-04-04: qty 18, 9d supply, fill #0

## 2022-04-04 NOTE — Patient Instructions (Signed)
____________________________________________________________________________________________  Patient Information update  To: All of our patients.  Re: Name change.  It has been made official that our current name, "[facility]"   will soon be changed to "[facility]".   The purpose of this change is to eliminate any confusion created by the concept of our practice being a "Medication Management Pain [facility]". In the past this has led to the misconception that we treat pain primarily by the use of prescription medications.  Nothing can be farther from the truth.   Understanding PAIN MANAGEMENT: To further understand what our practice does, you first have to understand that "Pain Management" is a subspecialty that requires additional training once a physician has completed their specialty training, which can be in either Anesthesia, Neurology, Psychiatry, or Physical Medicine and Rehabilitation (PMR). Each one of these contributes to the final approach taken by each physician to the management of their patient's pain. To be a "Pain Management Specialist" you must have first completed one of the specialty trainings below.  Anesthesiologists - trained in clinical pharmacology and interventional techniques such as nerve blockade and regional as well as central neuroanatomy. They are trained to block pain before, during, and after surgical interventions.  Neurologists - trained in the diagnosis and pharmacological treatment of complex neurological conditions, such as Multiple Sclerosis, Parkinson's, spinal cord injuries, and other systemic conditions that may be associated with symptoms that may include but are not limited to pain. They tend to rely primarily on the treatment of chronic pain using prescription medications.  Psychiatrist - trained in conditions affecting the psychosocial wellbeing  of patients including but not limited to depression, anxiety, schizophrenia, personality disorders, addiction, and other substance use disorders that may be associated with chronic pain. They tend to rely primarily on the treatment of chronic pain using prescription medications.   Physical Medicine and Rehabilitation (PMR) physicians, also known as physiatrists - trained to treat a wide variety of medical conditions affecting the brain, spinal cord, nerves, bones, joints, ligaments, muscles, and tendons. Their training is primarily aimed at treating patients that have suffered injuries that have caused severe physical impairment. Their training is primarily aimed at the physical therapy and rehabilitation of those patients. They may also work alongside orthopedic surgeons or neurosurgeons using their expertise in assisting surgical patients to recover after their surgeries.  INTERVENTIONAL PAIN MANAGEMENT is sub-subspecialty of Pain Management.  Our physicians are Board-certified in Anesthesia, Pain Management, and Interventional Pain Management.  This meaning that not only have they been trained and Board-certified in their specialty of Anesthesia, and subspecialty of Pain Management, but they have also received further training in the sub-subspecialty of Interventional Pain Management, in order to become Board-certified as INTERVENTIONAL PAIN MANAGEMENT SPECIALIST.    Mission: Our goal is to use our skills in  [place] as alternatives to the chronic use of prescription opioid medications for the treatment of pain. To make this more clear, we have changed our name to reflect what we do and offer. We will continue to offer medication management assessment and recommendations, but we will not be taking over any patient's medication management.  ____________________________________________________________________________________________    ______________________________________________________________________  Preparing for your procedure  During your procedure appointment there will be: No Prescription Refills. No disability issues to discussed. No medication changes or discussions.  Instructions: Food intake: Avoid eating anything solid for at least 8 hours prior to your procedure. Clear liquid  intake: You may take clear liquids such as water up to 2 hours prior to your procedure. (No carbonated drinks. No soda.) Transportation: Unless otherwise stated by your physician, bring a driver. Morning Medicines: Except for blood thinners, take all of your other morning medications with a sip of water. Make sure to take your heart and blood pressure medicines. If your blood pressure's lower number is above 100, the case will be rescheduled. Blood thinners: If you take a blood thinner, but were not instructed to stop it, call our office [phone] and ask to talk to a nurse. Not stopping a blood thinner prior to certain procedures could lead to serious complications. Diabetics on insulin: Notify the staff so that you can be scheduled 1st case in the morning. If your diabetes requires high dose insulin, take only  of your normal insulin dose the morning of the procedure and notify the staff that you have done so. Preventing infections: Shower with an antibacterial soap the morning of your procedure.  Build-up your immune system: Take 1000 mg of Vitamin C with every meal (3 times a day) the day prior to your procedure. Antibiotics: Inform the nursing staff if you are taking any antibiotics or if you have any conditions that may require antibiotics prior to procedures. (Example: recent joint implants)   Pregnancy: If you are pregnant make sure to notify the nursing staff. Not doing so may result in injury to the fetus, including death.  Sickness: If you have a cold, fever, or any active infections, call and cancel or reschedule your  procedure. Receiving steroids while having an infection may result in complications. Arrival: You must be in the facility at least 30 minutes prior to your scheduled procedure. Tardiness: Your scheduled time is also the cutoff time. If you do not arrive at least 15 minutes prior to your procedure, you will be rescheduled.  Children: Do not bring any children with you. Make arrangements to keep them home. Dress appropriately: There is always a possibility that your clothing may get soiled. Avoid long dresses. Valuables: Do not bring any jewelry or valuables.  Reasons to call and reschedule or cancel your procedure: (Following these recommendations will minimize the risk of a serious complication.) Surgeries: Avoid having procedures within 2 weeks of any surgery. (Avoid for 2 weeks before or after any surgery). Flu Shots: Avoid having procedures within 2 weeks of a flu shots or . (Avoid for 2 weeks before or after immunizations). Barium: Avoid having a procedure within 7-10 days after having had a radiological study involving the use of radiological contrast. (Myelograms, Barium swallow or enema study). Heart attacks: Avoid any elective procedures or surgeries for the initial 6 months after a "Myocardial Infarction" (Heart Attack). Blood thinners: It is imperative that you stop these medications before procedures. Let us know if you if you take any blood thinner.  Infection: Avoid procedures during or within two weeks of an infection (including chest colds or gastrointestinal problems). Symptoms associated with infections include: Localized redness, fever, chills, night sweats or profuse sweating, burning sensation when voiding, cough, congestion, stuffiness, runny nose, sore throat, diarrhea, nausea, vomiting, cold or Flu symptoms, recent or current infections. It is specially important if the infection is over the area that we intend to treat. Heart and lung problems: Symptoms that may suggest an  active cardiopulmonary problem include: cough, chest pain, breathing difficulties or shortness of breath, dizziness, ankle swelling, uncontrolled high or unusually low blood pressure, and/or palpitations. If you are  experiencing any of these symptoms, cancel your procedure and contact your primary care physician for an evaluation.  Remember:  Regular Business hours are:  Monday to Thursday 8:00 AM to 4:00 PM  Provider's Schedule: Milinda Pointer, MD:  Procedure days: Tuesday and Thursday 7:30 AM to 4:00 PM  Gillis Santa, MD:  Procedure days: Monday and Wednesday 7:30 AM to 4:00 PM  ______________________________________________________________________  ____________________________________________________________________________________________  General Risks and Possible Complications  Patient Responsibilities: It is important that you read this as it is part of your informed consent. It is our duty to inform you of the risks and possible complications associated with treatments offered to you. It is your responsibility as a patient to read this and to ask questions about anything that is not clear or that you believe was not covered in this document.  Patient's Rights: You have the right to refuse treatment. You also have the right to change your mind, even after initially having agreed to have the treatment done. However, under this last option, if you wait until the last second to change your mind, you may be charged for the materials used up to that point.  Introduction: Medicine is not an Chief Strategy Officer. Everything in Medicine, including the lack of treatment(s), carries the potential for danger, harm, or loss (which is by definition: Risk). In Medicine, a complication is a secondary problem, condition, or disease that can aggravate an already existing one. All treatments carry the risk of possible complications. The fact that a side effects or complications occurs, does not imply that  the treatment was conducted incorrectly. It must be clearly understood that these can happen even when everything is done following the highest safety standards.  No treatment: You can choose not to proceed with the proposed treatment alternative. The "PRO(s)" would include: avoiding the risk of complications associated with the therapy. The "CON(s)" would include: not getting any of the treatment benefits. These benefits fall under one of three categories: diagnostic; therapeutic; and/or palliative. Diagnostic benefits include: getting information which can ultimately lead to improvement of the disease or symptom(s). Therapeutic benefits are those associated with the successful treatment of the disease. Finally, palliative benefits are those related to the decrease of the primary symptoms, without necessarily curing the condition (example: decreasing the pain from a flare-up of a chronic condition, such as incurable terminal cancer).  General Risks and Complications: These are associated to most interventional treatments. They can occur alone, or in combination. They fall under one of the following six (6) categories: no benefit or worsening of symptoms; bleeding; infection; nerve damage; allergic reactions; and/or death. No benefits or worsening of symptoms: In Medicine there are no guarantees, only probabilities. No healthcare provider can ever guarantee that a medical treatment will work, they can only state the probability that it may. Furthermore, there is always the possibility that the condition may worsen, either directly, or indirectly, as a consequence of the treatment. Bleeding: This is more common if the patient is taking a blood thinner, either prescription or over the counter (example: Goody Powders, Fish oil, Aspirin, Garlic, etc.), or if suffering a condition associated with impaired coagulation (example: Hemophilia, cirrhosis of the liver, low platelet counts, etc.). However, even if you do  not have one on these, it can still happen. If you have any of these conditions, or take one of these drugs, make sure to notify your treating physician. Infection: This is more common in patients with a compromised immune system, either due to disease (  example: diabetes, cancer, human immunodeficiency virus [HIV], etc.), or due to medications or treatments (example: therapies used to treat cancer and rheumatological diseases). However, even if you do not have one on these, it can still happen. If you have any of these conditions, or take one of these drugs, make sure to notify your treating physician. Nerve Damage: This is more common when the treatment is an invasive one, but it can also happen with the use of medications, such as those used in the treatment of cancer. The damage can occur to small secondary nerves, or to large primary ones, such as those in the spinal cord and brain. This damage may be temporary or permanent and it may lead to impairments that can range from temporary numbness to permanent paralysis and/or brain death. Allergic Reactions: Any time a substance or material comes in contact with our body, there is the possibility of an allergic reaction. These can range from a mild skin rash (contact dermatitis) to a severe systemic reaction (anaphylactic reaction), which can result in death. Death: In general, any medical intervention can result in death, most of the time due to an unforeseen complication. ____________________________________________________________________________________________

## 2022-04-07 ENCOUNTER — Encounter: Payer: Self-pay | Admitting: Family Medicine

## 2022-04-07 DIAGNOSIS — I10 Essential (primary) hypertension: Secondary | ICD-10-CM | POA: Diagnosis not present

## 2022-04-07 NOTE — Telephone Encounter (Signed)
She needs a CMP and lipid panel - she'd need to get them today to have them in time for [facility] appt. Can see if she can do that

## 2022-04-07 NOTE — Progress Notes (Unsigned)
I,Tiffany J Bragg,acting as a scribe for Lavon Paganini, MD.,have documented all relevant documentation on the behalf of Lavon Paganini, MD,as directed by  Lavon Paganini, MD while in the presence of Lavon Paganini, MD.   Established patient visit   Patient: Elizabeth Bowers   DOB: 1965-10-12   56 y.o. Female  MRN: [id number] Visit Date: 04/10/2022  Today's healthcare provider: Lavon Paganini, MD   Chief Complaint  Patient presents with   Depression   Hypertension   Anxiety   Subjective    HPI  Hypertension, follow-up  BP Readings from Last 3 Encounters:  04/10/22 (!) 142/82  04/04/22 122/88  02/20/22 108/72   Wt Readings from Last 3 Encounters:  04/04/22 175 lb (79.4 kg)  02/20/22 170 lb (77.1 kg)  10/06/21 177 lb 12.8 oz (80.6 kg)     She was last seen for hypertension 6 months ago.  BP at that visit was 119/80. Management since that visit includes no changes.  She reports excellent compliance with treatment. She is not having side effects.  She is following a Regular diet. She is exercising. She does not smoke.  Use of agents associated with hypertension: steroids.   Outside blood pressures are sometimes 180/120. Symptoms: No chest pain No chest pressure  No palpitations No syncope  Yes dyspnea No orthopnea  Yes paroxysmal nocturnal dyspnea No lower extremity edema   Pertinent labs Lab Results  Component Value Date   CHOL 146 04/07/2022   HDL 64 04/07/2022   LDLCALC 70 04/07/2022   TRIG 59 04/07/2022   CHOLHDL 2.4 04/05/2021   Lab Results  Component Value Date   NA 139 04/07/2022   K 3.7 04/07/2022   CREATININE 1.19 (H) 04/07/2022   EGFR 53 (L) 04/07/2022   GLUCOSE 144 (H) 04/07/2022     The 10-year ASCVD risk score (Arnett DK, et al., 2019) is: 6%  --------------------------------------------------------------------------------------------------- Anxiety/depression, Follow-up  She was last seen for anxiety 6 months  ago. Changes made at last visit include no changes.   She reports excellent compliance with treatment. She reports excellent tolerance of treatment. She is not having side effects.   She feels her anxiety is moderate and Worse since last visit.  Symptoms: No chest pain No difficulty concentrating  Yes dizziness Yes fatigue  No feelings of losing control Yes insomnia  Yes irritable No palpitations  No panic attacks No racing thoughts  Yes shortness of breath No sweating  Yes tremors/shakes    GAD-7 Results    10/01/2020   10:18 AM  GAD-7 Generalized Anxiety Disorder Screening Tool  1. Feeling Nervous, Anxious, or on Edge 2  2. Not Being Able to Stop or Control Worrying 0  3. Worrying Too Much About Different Things 2  4. Trouble Relaxing 0  5. Being So Restless it's Hard To Sit Still 0  6. Becoming Easily Annoyed or Irritable 2  7. Feeling Afraid As If Something Awful Might Happen 2  Total GAD-7 Score 8  Difficulty At Work, Home, or Getting  Along With Others? Not difficult at all    PHQ-9 Scores    04/10/2022    2:26 PM 04/04/2022   11:42 AM 10/06/2021    1:19 PM  PHQ9 SCORE ONLY  PHQ-9 Total Score 11 0 4    ---------------------------------------------------------------------------------------------------   Medications: Outpatient Medications Prior to Visit  Medication Sig   buPROPion (WELLBUTRIN XL) 300 MG 24 hr tablet Take 1 tablet (300 mg total) by mouth daily.  DULoxetine (CYMBALTA) 60 MG capsule TAKE 1 CAPSULE BY MOUTH DAILY.   fluticasone (FLONASE) 50 MCG/ACT nasal spray Place 2 sprays into both nostrils daily.   lansoprazole (PREVACID) 30 MG capsule TAKE 1 CAPSULE BY MOUTH TWICE DAILY BEFORE A MEAL.   LINZESS 290 MCG CAPS capsule Take 1 capsule (290 mcg total) by mouth daily.   Multiple Vitamin (MULTIVITAMIN WITH MINERALS) TABS tablet Take 1 tablet by mouth daily.   ondansetron (ZOFRAN-ODT) 8 MG disintegrating tablet Take 1 tablet (8 mg total) by mouth  every 8 (eight) hours as needed for nausea or vomiting.   predniSONE (DELTASONE) 20 MG tablet Take 3 tablets (60 mg total) by mouth daily with breakfast for 3 days, THEN 2 tablets (40 mg total) daily with breakfast for 3 days, THEN 1 tablet (20 mg total) daily with breakfast for 3 days.   promethazine-dextromethorphan (PROMETHAZINE-DM) 6.25-15 MG/5ML syrup Take 5 mLs by mouth 4 (four) times daily as needed.   psyllium (METAMUCIL) 58.6 % powder Take 2 packets by mouth daily.   triamterene-hydrochlorothiazide (MAXZIDE-25) 37.5-25 MG tablet TAKE 1 TABLET BY MOUTH DAILY.   [DISCONTINUED] amLODipine (NORVASC) 5 MG tablet Take 1 tablet (5 mg total) by mouth daily. (Patient taking differently: Take 10 mg by mouth daily.)   [DISCONTINUED] ipratropium (ATROVENT) 0.06 % nasal spray Place 2 sprays into both nostrils 4 (four) times daily.   No facility-administered medications prior to visit.    Review of Systems per HPI     Objective    BP (!) 142/82 (BP Location: Left Arm, Patient Position: Sitting, Cuff Size: Normal)   Pulse 71    Physical Exam Vitals reviewed.  Constitutional:      General: She is not in acute distress.    Appearance: Normal appearance. She is well-developed. She is not diaphoretic.  HENT:     Head: Normocephalic and atraumatic.  Eyes:     General: No scleral icterus.    Conjunctiva/sclera: Conjunctivae normal.  Neck:     Thyroid: No thyromegaly.  Cardiovascular:     Rate and Rhythm: Normal rate and regular rhythm.     Pulses: Normal pulses.     Heart sounds: Normal heart sounds. No murmur heard. Pulmonary:     Effort: Pulmonary effort is normal. No respiratory distress.     Breath sounds: Normal breath sounds. No wheezing, rhonchi or rales.  Musculoskeletal:     Cervical back: Neck supple.     Right lower leg: No edema.     Left lower leg: No edema.  Lymphadenopathy:     Cervical: No cervical adenopathy.  Skin:    General: Skin is warm and dry.     Findings:  No rash.  Neurological:     Mental Status: She is alert and oriented to person, place, and time. Mental status is at baseline.  Psychiatric:        Mood and Affect: Mood normal.        Behavior: Behavior normal.       No results found for any visits on 04/10/22.  Assessment & Plan     Problem List Items Addressed This Visit       Cardiovascular and Mediastinum   Benign essential hypertension - Primary    Elevated today, but on prednisone Was well controlled before prednisone Will continue amlodpine 10 mg dialy and triamterene hctz If continues to be uncontrolled will add losartan      Relevant Medications   amLODipine (NORVASC) 10 MG tablet  Genitourinary   Stage 3 chronic kidney disease (HCC)    Chronic and stable Review metabolic panel Avoid nephrotoxic meds        Other   Anxiety    Fairly well controlled No changes to medications today      Class 1 obesity with serious comorbidity and body mass index (BMI) of 32.0 to 32.9 in adult    Discussed importance of healthy weight management Discussed diet and exercise       MDD (major depressive disorder)    Fairly well controlled No chnges to medications today        Return in about 6 months (around 10/09/2022) for CPE.      I, Lavon Paganini, MD, have reviewed all documentation for this visit. The documentation on 04/10/22 for the exam, diagnosis, procedures, and orders are all accurate and complete.   Bacigalupo, [name], MD, MPH [facility]

## 2022-04-08 LAB — COMPREHENSIVE METABOLIC PANEL
ALT: 46 IU/L — ABNORMAL HIGH (ref 0–32)
AST: 31 IU/L (ref 0–40)
Albumin/Globulin Ratio: 2.1 (ref 1.2–2.2)
Albumin: 4.8 g/dL (ref 3.8–4.9)
Alkaline Phosphatase: 96 IU/L (ref 44–121)
BUN/Creatinine Ratio: 13 (ref 9–23)
BUN: 16 mg/dL (ref 6–24)
Bilirubin Total: 0.3 mg/dL (ref 0.0–1.2)
CO2: 23 mmol/L (ref 20–29)
Calcium: 10.1 mg/dL (ref 8.7–10.2)
Chloride: 98 mmol/L (ref 96–106)
Creatinine, Ser: 1.19 mg/dL — ABNORMAL HIGH (ref 0.57–1.00)
Globulin, Total: 2.3 g/dL (ref 1.5–4.5)
Glucose: 144 mg/dL — ABNORMAL HIGH (ref 70–99)
Potassium: 3.7 mmol/L (ref 3.5–5.2)
Sodium: 139 mmol/L (ref 134–144)
Total Protein: 7.1 g/dL (ref 6.0–8.5)
eGFR: 53 mL/min/{1.73_m2} — ABNORMAL LOW (ref >59)

## 2022-04-08 LAB — LIPID PANEL WITH LDL/HDL RATIO
Cholesterol, Total: 146 mg/dL (ref 100–199)
HDL: 64 mg/dL (ref >39)
LDL Chol Calc (NIH): 70 mg/dL (ref 0–99)
LDL/HDL Ratio: 1.1 ratio (ref 0.0–3.2)
Triglycerides: 59 mg/dL (ref 0–149)
VLDL Cholesterol Cal: 12 mg/dL (ref 5–40)

## 2022-04-10 ENCOUNTER — Other Ambulatory Visit (HOSPITAL_COMMUNITY): Payer: Self-pay

## 2022-04-10 ENCOUNTER — Ambulatory Visit (INDEPENDENT_AMBULATORY_CARE_PROVIDER_SITE_OTHER): Payer: 59 | Admitting: Family Medicine

## 2022-04-10 ENCOUNTER — Encounter: Payer: Self-pay | Admitting: Family Medicine

## 2022-04-10 VITALS — BP 142/82 | HR 71

## 2022-04-10 DIAGNOSIS — I10 Essential (primary) hypertension: Secondary | ICD-10-CM

## 2022-04-10 DIAGNOSIS — E669 Obesity, unspecified: Secondary | ICD-10-CM

## 2022-04-10 DIAGNOSIS — F419 Anxiety disorder, unspecified: Secondary | ICD-10-CM | POA: Diagnosis not present

## 2022-04-10 DIAGNOSIS — N1831 Chronic kidney disease, stage 3a: Secondary | ICD-10-CM

## 2022-04-10 DIAGNOSIS — F331 Major depressive disorder, recurrent, moderate: Secondary | ICD-10-CM | POA: Diagnosis not present

## 2022-04-10 DIAGNOSIS — Z6832 Body mass index (BMI) 32.0-32.9, adult: Secondary | ICD-10-CM

## 2022-04-10 MED ORDER — IPRATROPIUM BROMIDE 0.06 % NA SOLN
2.0000 | Freq: Four times a day (QID) | NASAL | 12 refills | Status: DC
  Filled 2022-04-10: qty 15, 19d supply, fill #0
  Filled 2022-05-07: qty 15, 11d supply, fill #0
  Filled 2022-05-29: qty 15, 11d supply, fill #1
  Filled 2022-07-01: qty 15, 11d supply, fill #2

## 2022-04-10 MED ORDER — AMLODIPINE BESYLATE 10 MG PO TABS
10.0000 mg | ORAL_TABLET | Freq: Every day | ORAL | 1 refills | Status: DC
  Filled 2022-04-10: qty 90, 90d supply, fill #0
  Filled 2022-07-01 – 2022-07-04 (×2): qty 90, 90d supply, fill #1

## 2022-04-10 NOTE — Assessment & Plan Note (Signed)
Chronic and stable Review metabolic panel Avoid nephrotoxic meds

## 2022-04-10 NOTE — Assessment & Plan Note (Signed)
Elevated today, but on prednisone Was well controlled before prednisone Will continue amlodpine 10 mg dialy and triamterene hctz If continues to be uncontrolled will add losartan

## 2022-04-10 NOTE — Assessment & Plan Note (Signed)
Fairly well controlled No chnges to medications today

## 2022-04-10 NOTE — Assessment & Plan Note (Signed)
Discussed importance of healthy weight management Discussed diet and exercise  

## 2022-04-10 NOTE — Assessment & Plan Note (Signed)
Fairly well controlled No changes to medications today

## 2022-04-18 ENCOUNTER — Ambulatory Visit: Payer: 59

## 2022-04-18 NOTE — Progress Notes (Unsigned)
Patient: Elizabeth Bowers  Service Category: E/M  Provider: Gaspar Cola, MD  DOB: Aug 30, 1965  DOS: 04/19/2022  Location: Office  MRN: [id number]  Setting: Ambulatory outpatient  Referring Provider: Virginia Crews, MD  Type: Established Patient  Specialty: Interventional Pain Management  PCP: Virginia Crews, MD  Location: Remote location  Delivery: TeleHealth     Virtual Encounter - Pain Management PROVIDER NOTE: Information contained herein reflects review and annotations entered in association with encounter. Interpretation of such information and data should be left to medically-trained personnel. Information provided to patient can be located elsewhere in the medical record under "Patient Instructions". Document created using STT-dictation technology, any transcriptional errors that may result from process are unintentional.    Contact & Pharmacy Preferred: [phone] Home: [phone] (home) Mobile: [phone] (mobile) E-mail: [email or web address]  [place] [address] Phone: [phone] Fax: [place] [place] [address] Phone: [phone] Fax: [phone]   Pre-screening  Elizabeth Bowers offered "in-person" vs "virtual" encounter. She indicated preferring virtual for this encounter.   Reason COVID-19*  Social distancing based on CDC and AMA recommendations.   I contacted [place] on 04/19/2022 via telephone.      I clearly identified myself as Gaspar Cola, MD. I verified that I was speaking with the correct person using two identifiers (Name: Elizabeth Bowers, and date of birth: 03-11-1966).  Consent I sought verbal advanced consent from [place] for virtual visit interactions. I informed Elizabeth Bowers of possible security and privacy concerns, risks, and limitations associated with providing  "not-in-person" medical evaluation and management services. I also informed Elizabeth Bowers of the availability of "in-person" appointments. Finally, I informed her that there would be a charge for the virtual visit and that she could be  personally, fully or partially, financially responsible for it. Elizabeth Bowers expressed understanding and agreed to proceed.   Historic Elements   Elizabeth Bowers is a 56 y.o. year old, female patient evaluated today after our last contact on 04/04/2022. Elizabeth Bowers  has a past medical history of Fatty liver, Fibromyalgia, GERD (gastroesophageal reflux disease), Hemorrhoids, Hot flashes, Hypertension, IBS (irritable bowel syndrome), LUQ abdominal pain (10/25/2016), Migraines, Miscarriage, Multilevel degenerative disc disease, Neuroma, and Vocal cord dysfunction. She also  has a past surgical history that includes Elbow Debridement; Foot surgery; Cholecystectomy; Colonoscopy; Abdominal hysterectomy; Esophagogastroduodenoscopy (egd) with propofol (N/A, 02/06/2018); Colonoscopy with propofol (N/A, 12/22/2019); Colonoscopy with propofol (N/A, 12/23/2019); Image guided sinus surgery (N/A, 09/28/2021); Maxillary antrostomy (Bilateral, 09/28/2021); Ethmoidectomy (N/A, 09/28/2021); and Sphenoidectomy (Bilateral, 09/28/2021). Elizabeth Bowers has a current medication list which includes the following prescription(s): amlodipine, bupropion, duloxetine, fluticasone, ipratropium, lansoprazole, linzess, multivitamin with minerals, ondansetron, promethazine-dextromethorphan, psyllium, and triamterene-hydrochlorothiazide. She  reports that she quit smoking about 34 years ago. Her smoking use included cigarettes. She has a 3.75 pack-year smoking history. She has never used smokeless tobacco. She reports current alcohol use of about 2.0 - 4.0 standard drinks of alcohol per week. She reports that she does not use drugs. Elizabeth Bowers is allergic to gluten meal, lactose, ranitidine, amphetamine-dextroamphetamine, lactose  intolerance (gi), reglan [metoclopramide], topiramate er, wheat bran, soap, and sulfa antibiotics.   HPI  Today, she is being contacted for follow-up evaluation after a trial of oral steroids. ***  Pharmacotherapy Assessment   Opioid Analgesic: No chronic opioid analgesics therapy prescribed by our practice.   Monitoring: [place]  PMP: PDMP reviewed during this encounter.       Pharmacotherapy: No side-effects or adverse reactions reported. Compliance: No problems identified. Effectiveness: Clinically acceptable. Plan: Refer to "POC". UDS:  Summary  Date Value Ref Range Status  01/16/2018 FINAL  Final    Comment:    ==================================================================== TOXASSURE COMP DRUG ANALYSIS,UR ==================================================================== Test                             Result       Flag       Units Drug Present and Declared for Prescription Verification   Oxazepam                       51           EXPECTED   ng/mg creat   Temazepam                      44           EXPECTED   ng/mg creat    Oxazepam and temazepam are expected metabolites of diazepam.    Oxazepam is also an expected metabolite of other benzodiazepine    drugs, including chlordiazepoxide, prazepam, clorazepate,    halazepam, and temazepam.  Oxazepam and temazepam are available    as scheduled prescription medications.   Phentermine                    PRESENT      EXPECTED   Duloxetine                     PRESENT      EXPECTED Drug Present not Declared for Prescription Verification   Topiramate                     PRESENT      UNEXPECTED   Acetaminophen                  PRESENT      UNEXPECTED Drug Absent but Declared for Prescription Verification   Cyclobenzaprine                Not Detected UNEXPECTED   Promethazine                   Not Detected UNEXPECTED ==================================================================== Test                      Result    Flag    Units      Ref Range   Creatinine              57               mg/dL      >=20 ==================================================================== Declared Medications:  The flagging and interpretation on this report are based on the  following declared medications.  Unexpected results may arise from  inaccuracies in the declared medications.  **Note: The testing scope of this panel includes these medications:  Cyclobenzaprine  Diazepam  Duloxetine  Phentermine  Promethazine  **Note: The testing scope of this panel does not include following  reported medications:  Acyclovir  Budesonide  Docusate  Hydrochlorothiazide (Triamterine-Hydrochlorthzide)  Levocetirizine  Linaclotide  Meclizine  Montelukast  Multivitamin  Potassium  Triamterene (Triamterine-Hydrochlorthzide)  Valacyclovir ==================================================================== For clinical consultation, please call [phone]. ====================================================================    No results found for: "  CBDTHCR", "D8THCCBX", "D9THCCBX"   Laboratory Chemistry Profile   Renal Lab Results  Component Value Date   BUN 16 04/07/2022   CREATININE 1.19 (H) 04/07/2022   BCR 13 04/07/2022   GFRAA >60 07/07/2019   GFRNONAA 56 (L) 05/05/2021    Hepatic Lab Results  Component Value Date   AST 31 04/07/2022   ALT 46 (H) 04/07/2022   ALBUMIN 4.8 04/07/2022   ALKPHOS 96 04/07/2022   LIPASE 33 12/15/2016    Electrolytes Lab Results  Component Value Date   NA 139 04/07/2022   K 3.7 04/07/2022   CL 98 04/07/2022   CALCIUM 10.1 04/07/2022   MG 2.2 01/16/2018    Bone Lab Results  Component Value Date   25OHVITD1 51 01/16/2018   25OHVITD2 <1.0 01/16/2018   25OHVITD3 50 01/16/2018    Inflammation (CRP: Acute Phase) (ESR: Chronic Phase) Lab Results  Component Value Date   CRP 0.8 07/05/2021   ESRSEDRATE 17 07/05/2021         Note: Above Lab results reviewed.  Imaging   MM 3D SCREEN BREAST BILATERAL CLINICAL DATA:  Screening.  EXAM: DIGITAL SCREENING BILATERAL MAMMOGRAM WITH TOMOSYNTHESIS AND CAD  TECHNIQUE: Bilateral screening digital craniocaudal and mediolateral oblique mammograms were obtained. Bilateral screening digital breast tomosynthesis was performed. The images were evaluated with computer-aided detection.  COMPARISON:  Previous exam(s).  ACR Breast Density Category b: There are scattered areas of fibroglandular density.  FINDINGS: There are no findings suspicious for malignancy.  IMPRESSION: No mammographic evidence of malignancy. A result letter of this screening mammogram will be mailed directly to the patient.  RECOMMENDATION: Screening mammogram in one year. (Code:[id number])  BI-RADS CATEGORY  1: Negative.  Electronically Signed   By: Valentino Saxon M.D.   On: 10/14/2021 13:32  Assessment  There were no encounter diagnoses.  Plan of Care  Problem-specific:  No problem-specific Assessment & Plan notes found for this encounter.  Elizabeth Bowers has a current medication list which includes the following long-term medication(s): amlodipine, bupropion, duloxetine, fluticasone, ipratropium, lansoprazole, linzess, and triamterene-hydrochlorothiazide.  Pharmacotherapy (Medications Ordered): No orders of the defined types were placed in this encounter.  Orders:  No orders of the defined types were placed in this encounter.  Follow-up plan:   No follow-ups on file.     Interventional Therapies  Risk  Complexity Considerations:   Estimated body mass index is 32.01 kg/m as calculated from the following:   Height as of this encounter: _0  (1.575 m).   Weight as of this encounter: 175 lb (79.4 kg). WNL   Planned  Pending:   Oral prednisone taper ordered today 04/04/2022.   Under consideration:   Diagnostic/therapeutic right cervical ESI #2 (last 1 done on 06/27/2018)    Completed:    Diagnostic/therapeutic right L3-4 LESI x1 (09/08/2021) (100/100/100/100)  Palliative bilateral cervical facet MBB x2 (06/13/2018) (100/100/0/<25)  Diagnostic bilateral lumbar facet MBB x2 (08/08/2018) [facility])  Diagnostic right Cervical ESI x1 (06/27/2018) (100/20/70/50-75)  Therapeutic right cervical facet RFA x1 (07/23/2018) (100/50/0)    Completed by other providers:   Therapeutic sphenopalatine ganglion Blk by Dr. Brigitte Pulse x2 (03/04/2018) Diagnostic bilateral upper extremity EMG/PNCV by Dr. Brigitte Pulse: Right Grade II & Left Grade I CTS (08/23/2018)    Therapeutic  Palliative (PRN) options:   Therapeutic/palliative cervical facet MBB   Therapeutic/palliativelumbar facet MBB   Therapeutic/palliative Cervical ESI   Therapeutic/palliative cervical facet RFA      Recent Visits Date Type Provider Dept  04/04/22 Office Visit Dossie Arbour,  Beatriz Chancellor, MD [facility]  Showing recent visits within past 90 days and meeting all other requirements Future Appointments Date Type Provider Dept  04/19/22 Office Visit Milinda Pointer, MD [facility]  Showing future appointments within next 90 days and meeting all other requirements  I discussed the assessment and treatment plan with the patient. The patient was provided an opportunity to ask questions and all were answered. The patient agreed with the plan and demonstrated an understanding of the instructions.  Patient advised to call back or seek an in-person evaluation if the symptoms or condition worsens.  Duration of encounter: *** minutes.  Note by: Gaspar Cola, MD Date: 04/19/2022; Time: 3:27 PM

## 2022-04-19 ENCOUNTER — Ambulatory Visit: Payer: 59 | Attending: Pain Medicine | Admitting: Pain Medicine

## 2022-04-19 DIAGNOSIS — M4802 Spinal stenosis, cervical region: Secondary | ICD-10-CM | POA: Diagnosis not present

## 2022-04-19 DIAGNOSIS — M5030 Other cervical disc degeneration, unspecified cervical region: Secondary | ICD-10-CM

## 2022-04-19 DIAGNOSIS — R937 Abnormal findings on diagnostic imaging of other parts of musculoskeletal system: Secondary | ICD-10-CM

## 2022-04-19 DIAGNOSIS — M542 Cervicalgia: Secondary | ICD-10-CM

## 2022-04-19 DIAGNOSIS — M4310 Spondylolisthesis, site unspecified: Secondary | ICD-10-CM | POA: Diagnosis not present

## 2022-04-19 DIAGNOSIS — M5412 Radiculopathy, cervical region: Secondary | ICD-10-CM

## 2022-04-19 DIAGNOSIS — M4692 Unspecified inflammatory spondylopathy, cervical region: Secondary | ICD-10-CM

## 2022-04-19 NOTE — Patient Instructions (Signed)
______________________________________________________________________  Preparing for your procedure  During your procedure appointment there will be: No Prescription Refills. No disability issues to discussed. No medication changes or discussions.  Instructions: Food intake: Avoid eating anything solid for at least 8 hours prior to your procedure. Clear liquid intake: You may take clear liquids such as water up to 2 hours prior to your procedure. (No carbonated drinks. No soda.) Transportation: Unless otherwise stated by your physician, bring a driver. Morning Medicines: Except for blood thinners, take all of your other morning medications with a sip of water. Make sure to take your heart and blood pressure medicines. If your blood pressure's lower number is above 100, the case will be rescheduled. Blood thinners: If you take a blood thinner, but were not instructed to stop it, call our office [phone] and ask to talk to a nurse. Not stopping a blood thinner prior to certain procedures could lead to serious complications. Diabetics on insulin: Notify the staff so that you can be scheduled 1st case in the morning. If your diabetes requires high dose insulin, take only  of your normal insulin dose the morning of the procedure and notify the staff that you have done so. Preventing infections: Shower with an antibacterial soap the morning of your procedure.  Build-up your immune system: Take 1000 mg of Vitamin C with every meal (3 times a day) the day prior to your procedure. Antibiotics: Inform the nursing staff if you are taking any antibiotics or if you have any conditions that may require antibiotics prior to procedures. (Example: recent joint implants)   Pregnancy: If you are pregnant make sure to notify the nursing staff. Not doing so may result in injury to the fetus, including death.  Sickness: If you have a cold, fever, or any active infections, call and cancel or reschedule your  procedure. Receiving steroids while having an infection may result in complications. Arrival: You must be in the facility at least 30 minutes prior to your scheduled procedure. Tardiness: Your scheduled time is also the cutoff time. If you do not arrive at least 15 minutes prior to your procedure, you will be rescheduled.  Children: Do not bring any children with you. Make arrangements to keep them home. Dress appropriately: There is always a possibility that your clothing may get soiled. Avoid long dresses. Valuables: Do not bring any jewelry or valuables.  Reasons to call and reschedule or cancel your procedure: (Following these recommendations will minimize the risk of a serious complication.) Surgeries: Avoid having procedures within 2 weeks of any surgery. (Avoid for 2 weeks before or after any surgery). Flu Shots: Avoid having procedures within 2 weeks of a flu shots or . (Avoid for 2 weeks before or after immunizations). Barium: Avoid having a procedure within 7-10 days after having had a radiological study involving the use of radiological contrast. (Myelograms, Barium swallow or enema study). Heart attacks: Avoid any elective procedures or surgeries for the initial 6 months after a "Myocardial Infarction" (Heart Attack). Blood thinners: It is imperative that you stop these medications before procedures. Let us know if you if you take any blood thinner.  Infection: Avoid procedures during or within two weeks of an infection (including chest colds or gastrointestinal problems). Symptoms associated with infections include: Localized redness, fever, chills, night sweats or profuse sweating, burning sensation when voiding, cough, congestion, stuffiness, runny nose, sore throat, diarrhea, nausea, vomiting, cold or Flu symptoms, recent or current infections. It is specially important if the infection is   over the area that we intend to treat. Heart and lung problems: Symptoms that may suggest an  active cardiopulmonary problem include: cough, chest pain, breathing difficulties or shortness of breath, dizziness, ankle swelling, uncontrolled high or unusually low blood pressure, and/or palpitations. If you are experiencing any of these symptoms, cancel your procedure and contact your primary care physician for an evaluation.  Remember:  Regular Business hours are:  Monday to Thursday 8:00 AM to 4:00 PM  Provider's Schedule: Raahil Ong, MD:  Procedure days: Tuesday and Thursday 7:30 AM to 4:00 PM  Bilal Lateef, MD:  Procedure days: Monday and Wednesday 7:30 AM to 4:00 PM  ______________________________________________________________________    ____________________________________________________________________________________________  General Risks and Possible Complications  Patient Responsibilities: It is important that you read this as it is part of your informed consent. It is our duty to inform you of the risks and possible complications associated with treatments offered to you. It is your responsibility as a patient to read this and to ask questions about anything that is not clear or that you believe was not covered in this document.  Patient's Rights: You have the right to refuse treatment. You also have the right to change your mind, even after initially having agreed to have the treatment done. However, under this last option, if you wait until the last second to change your mind, you may be charged for the materials used up to that point.  Introduction: Medicine is not an exact science. Everything in Medicine, including the lack of treatment(s), carries the potential for danger, harm, or loss (which is by definition: Risk). In Medicine, a complication is a secondary problem, condition, or disease that can aggravate an already existing one. All treatments carry the risk of possible complications. The fact that a side effects or complications occurs, does not imply  that the treatment was conducted incorrectly. It must be clearly understood that these can happen even when everything is done following the highest safety standards.  No treatment: You can choose not to proceed with the proposed treatment alternative. The "PRO(s)" would include: avoiding the risk of complications associated with the therapy. The "CON(s)" would include: not getting any of the treatment benefits. These benefits fall under one of three categories: diagnostic; therapeutic; and/or palliative. Diagnostic benefits include: getting information which can ultimately lead to improvement of the disease or symptom(s). Therapeutic benefits are those associated with the successful treatment of the disease. Finally, palliative benefits are those related to the decrease of the primary symptoms, without necessarily curing the condition (example: decreasing the pain from a flare-up of a chronic condition, such as incurable terminal cancer).  General Risks and Complications: These are associated to most interventional treatments. They can occur alone, or in combination. They fall under one of the following six (6) categories: no benefit or worsening of symptoms; bleeding; infection; nerve damage; allergic reactions; and/or death. No benefits or worsening of symptoms: In Medicine there are no guarantees, only probabilities. No healthcare provider can ever guarantee that a medical treatment will work, they can only state the probability that it may. Furthermore, there is always the possibility that the condition may worsen, either directly, or indirectly, as a consequence of the treatment. Bleeding: This is more common if the patient is taking a blood thinner, either prescription or over the counter (example: Goody Powders, Fish oil, Aspirin, Garlic, etc.), or if suffering a condition associated with impaired coagulation (example: Hemophilia, cirrhosis of the liver, low platelet counts, etc.). However, even if   you  do not have one on these, it can still happen. If you have any of these conditions, or take one of these drugs, make sure to notify your treating physician. Infection: This is more common in patients with a compromised immune system, either due to disease (example: diabetes, cancer, human immunodeficiency virus [HIV], etc.), or due to medications or treatments (example: therapies used to treat cancer and rheumatological diseases). However, even if you do not have one on these, it can still happen. If you have any of these conditions, or take one of these drugs, make sure to notify your treating physician. Nerve Damage: This is more common when the treatment is an invasive one, but it can also happen with the use of medications, such as those used in the treatment of cancer. The damage can occur to small secondary nerves, or to large primary ones, such as those in the spinal cord and brain. This damage may be temporary or permanent and it may lead to impairments that can range from temporary numbness to permanent paralysis and/or brain death. Allergic Reactions: Any time a substance or material comes in contact with our body, there is the possibility of an allergic reaction. These can range from a mild skin rash (contact dermatitis) to a severe systemic reaction (anaphylactic reaction), which can result in death. Death: In general, any medical intervention can result in death, most of the time due to an unforeseen complication. ____________________________________________________________________________________________    

## 2022-04-20 ENCOUNTER — Other Ambulatory Visit (HOSPITAL_COMMUNITY): Payer: Self-pay

## 2022-04-20 ENCOUNTER — Other Ambulatory Visit: Payer: Self-pay | Admitting: Family Medicine

## 2022-04-20 MED ORDER — BUPROPION HCL ER (XL) 300 MG PO TB24
300.0000 mg | ORAL_TABLET | Freq: Every day | ORAL | 0 refills | Status: DC
  Filled 2022-04-20: qty 90, 90d supply, fill #0

## 2022-04-20 NOTE — Telephone Encounter (Signed)
Requested medication (s) are due for refill today: yes  Requested medication (s) are on the active medication list: yes  Last refill:  01/17/22 #90 0 refills  Future visit scheduled: yes in 5 months  Notes to clinic:  protocol failed last labs 04/07/22. Do you want to refill Rx?     Requested Prescriptions  Pending Prescriptions Disp Refills   buPROPion (WELLBUTRIN XL) 300 MG 24 hr tablet 90 tablet 0    Sig: Take 1 tablet (300 mg total) by mouth daily.     Psychiatry: Antidepressants - bupropion Failed - 04/20/2022  7:41 AM      Failed - Cr in normal range and within 360 days    Creatinine, Ser  Date Value Ref Range Status  04/07/2022 1.19 (H) 0.57 - 1.00 mg/dL Final         Failed - ALT in normal range and within 360 days    ALT  Date Value Ref Range Status  04/07/2022 46 (H) 0 - 32 IU/L Final         Failed - Last BP in normal range    BP Readings from Last 1 Encounters:  04/10/22 (!) 142/82         Passed - AST in normal range and within 360 days    AST  Date Value Ref Range Status  04/07/2022 31 0 - 40 IU/L Final         Passed - Completed PHQ-2 or PHQ-9 in the last 360 days      Passed - Valid encounter within last 6 months    Recent Outpatient Visits           1 week ago Benign essential hypertension   [facility] [place], [name], MD   6 months ago Encounter for annual physical exam   [facility] [place], [name], MD   6 months ago Benign essential hypertension   [facility] [place], [name], MD   9 months ago Acute cough   [facility] [place], [name], MD   1 year ago Benign essential hypertension   [place], [name], MD       Future Appointments             In 5 months Bacigalupo, [name], MD [facility] [place]

## 2022-04-21 ENCOUNTER — Other Ambulatory Visit (HOSPITAL_COMMUNITY): Payer: Self-pay

## 2022-05-07 ENCOUNTER — Other Ambulatory Visit: Payer: Self-pay | Admitting: Family Medicine

## 2022-05-07 DIAGNOSIS — K219 Gastro-esophageal reflux disease without esophagitis: Secondary | ICD-10-CM

## 2022-05-08 ENCOUNTER — Other Ambulatory Visit (HOSPITAL_COMMUNITY): Payer: Self-pay

## 2022-05-08 MED ORDER — LANSOPRAZOLE 30 MG PO CPDR
30.0000 mg | DELAYED_RELEASE_CAPSULE | Freq: Two times a day (BID) | ORAL | 1 refills | Status: DC
  Filled 2022-05-08: qty 180, 90d supply, fill #0
  Filled 2022-08-16: qty 180, 90d supply, fill #1

## 2022-05-08 MED ORDER — DULOXETINE HCL 60 MG PO CPEP
60.0000 mg | ORAL_CAPSULE | Freq: Every day | ORAL | 1 refills | Status: DC
  Filled 2022-05-08: qty 90, 90d supply, fill #0
  Filled 2022-08-16: qty 90, 90d supply, fill #1

## 2022-05-08 NOTE — Telephone Encounter (Signed)
Requested Prescriptions  Pending Prescriptions Disp Refills   lansoprazole (PREVACID) 30 MG capsule 180 capsule 1    Sig: TAKE 1 CAPSULE BY MOUTH TWICE DAILY BEFORE A MEAL.     Gastroenterology: Proton Pump Inhibitors 2 Failed - 05/07/2022 10:40 PM      Failed - ALT in normal range and within 360 days    ALT  Date Value Ref Range Status  04/07/2022 46 (H) 0 - 32 IU/L Final         Passed - AST in normal range and within 360 days    AST  Date Value Ref Range Status  04/07/2022 31 0 - 40 IU/L Final         Passed - Valid encounter within last 12 months    Recent Outpatient Visits           4 weeks ago Benign essential hypertension   [facility] [place], [name], MD   7 months ago Encounter for annual physical exam   [facility] [place], [name], MD   7 months ago Benign essential hypertension   [facility], [name], MD   10 months ago Acute cough   [facility] [place], [name], MD   1 year ago Benign essential hypertension   [place], [name], MD       Future Appointments             In 5 months Bacigalupo, [name], MD [facility], [place]

## 2022-05-10 ENCOUNTER — Encounter: Payer: Self-pay | Admitting: Family Medicine

## 2022-05-11 ENCOUNTER — Ambulatory Visit
Admission: RE | Admit: 2022-05-11 | Discharge: 2022-05-11 | Disposition: A | Discharge status: Home / Self Care | Payer: 59 | Source: Ambulatory Visit | Attending: Pain Medicine | Admitting: Pain Medicine

## 2022-05-11 ENCOUNTER — Ambulatory Visit: Payer: 59 | Attending: Pain Medicine | Admitting: Pain Medicine

## 2022-05-11 ENCOUNTER — Encounter: Payer: Self-pay | Admitting: Pain Medicine

## 2022-05-11 VITALS — BP 126/86 | HR 80 | Temp 97.7°F | Resp 14 | Ht 62.0 in | Wt 172.0 lb

## 2022-05-11 DIAGNOSIS — G4486 Cervicogenic headache: Secondary | ICD-10-CM | POA: Insufficient documentation

## 2022-05-11 DIAGNOSIS — M4802 Spinal stenosis, cervical region: Secondary | ICD-10-CM | POA: Diagnosis not present

## 2022-05-11 DIAGNOSIS — M5030 Other cervical disc degeneration, unspecified cervical region: Secondary | ICD-10-CM | POA: Diagnosis not present

## 2022-05-11 DIAGNOSIS — M542 Cervicalgia: Secondary | ICD-10-CM | POA: Diagnosis not present

## 2022-05-11 DIAGNOSIS — M4310 Spondylolisthesis, site unspecified: Secondary | ICD-10-CM | POA: Diagnosis not present

## 2022-05-11 DIAGNOSIS — M4692 Unspecified inflammatory spondylopathy, cervical region: Secondary | ICD-10-CM | POA: Diagnosis not present

## 2022-05-11 DIAGNOSIS — M5412 Radiculopathy, cervical region: Secondary | ICD-10-CM | POA: Diagnosis not present

## 2022-05-11 MED ORDER — LACTATED RINGERS IV SOLN: Freq: Once | INTRAVENOUS | Status: AC

## 2022-05-11 MED ORDER — DEXAMETHASONE SODIUM PHOSPHATE 10 MG/ML IJ SOLN
10.0000 mg | Freq: Once | INTRAMUSCULAR | Status: AC
  Administered 2022-05-11: 10 mg
  Filled 2022-05-11: qty 1

## 2022-05-11 MED ORDER — MIDAZOLAM HCL 2 MG/2ML IJ SOLN
0.5000 mg | Freq: Once | INTRAMUSCULAR | Status: AC
  Administered 2022-05-11: 2 mg via INTRAVENOUS
  Filled 2022-05-11: qty 2

## 2022-05-11 MED ORDER — ROPIVACAINE HCL 2 MG/ML IJ SOLN
1.0000 mL | Freq: Once | INTRAMUSCULAR | Status: AC
  Administered 2022-05-11: 1 mL via EPIDURAL
  Filled 2022-05-11: qty 20

## 2022-05-11 MED ORDER — IOHEXOL 180 MG/ML  SOLN
10.0000 mL | Freq: Once | INTRAMUSCULAR | Status: AC
  Administered 2022-05-11: 10 mL via EPIDURAL
  Filled 2022-05-11: qty 20

## 2022-05-11 MED ORDER — SODIUM CHLORIDE 0.9% FLUSH
1.0000 mL | Freq: Once | INTRAVENOUS | Status: AC
  Administered 2022-05-11: 1 mL

## 2022-05-11 MED ORDER — LIDOCAINE HCL 2 % IJ SOLN
20.0000 mL | Freq: Once | INTRAMUSCULAR | Status: AC
  Administered 2022-05-11: 400 mg
  Filled 2022-05-11: qty 20

## 2022-05-11 MED ORDER — PENTAFLUOROPROP-TETRAFLUOROETH EX AERO
INHALATION_SPRAY | Freq: Once | CUTANEOUS | Status: AC
  Administered 2022-05-11: 30 via TOPICAL

## 2022-05-11 NOTE — Progress Notes (Signed)
Safety precautions to be maintained throughout the outpatient stay will include: orient to surroundings, keep bed in low position, maintain call bell within reach at all times, provide assistance with transfer out of bed and ambulation.  

## 2022-05-11 NOTE — Progress Notes (Signed)
PROVIDER NOTE: Interpretation of information contained herein should be left to medically-trained personnel. Specific patient instructions are provided elsewhere under "Patient Instructions" section of medical record. This document was created in part using STT-dictation technology, any transcriptional errors that may result from this process are unintentional.  Patient: Elizabeth Bowers Type: Established DOB: 11/27/1965 MRN: [id number] PCP: Virginia Crews, MD  Service: Procedure DOS: 05/11/2022 Setting: Ambulatory Location: Ambulatory outpatient facility Delivery: Face-to-face Provider: Gaspar Cola, MD Specialty: Interventional Pain Management Specialty designation: 09 Location: Outpatient facility Ref. Prov.: Bacigalupo, [name], MD   Procedure [facility] Interventional Pain Management )   Type: Cervical Epidural Steroid injection (ESI) (Interlaminar) #2  Laterality: Right  Level: C7-T1 Imaging: Fluoroscopy-assisted DOS: 05/11/2022  Performed by: Milinda Pointer, MD Anesthesia: Local anesthesia (1-2% Lidocaine) Anxiolysis: IV Versed 2.0 mg Sedation: No Sedation                         Purpose: Diagnostic/Therapeutic Indications: Cervicalgia, cervical radicular pain, degenerative disc disease, severe enough to impact quality of life or function. 1. Cervical radiculopathy at C6 (Right)   2. Cervicalgia (Bilateral) (R>L)   3. Cervical Grade 1 (2 mm) Retrolisthesis C5 over C6   4. 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 Cervical spondylitis with radiculitis (HCC) (C6) (Bilateral) (R>L)   6. Cervicogenic headache (Bilateral)   7. DDD (degenerative disc disease), cervical    NAS-11 score:   Pre-procedure: 4 /10   Post-procedure: 3 /10      Pre-Procedure Preparation  Monitoring: As per clinic protocol. Respiration, ETCO2, SpO2, BP, heart rate and rhythm monitor placed and checked for adequate function  Risk Assessment: Vitals:  [id number] body mass  index is 31.46 kg/m as calculated from the following:   Height as of this encounter: '5\' 2"'$  (1.575 m).   Weight as of this encounter: 172 lb (78 kg)., Rate:82 , BP:(!) 132/91, Resp:13, Temp:97.7 F (36.5 C), SpO2:100 %  Allergies: She is allergic to gluten meal, lactose, ranitidine, amphetamine-dextroamphetamine, lactose intolerance (gi), reglan [metoclopramide], topiramate er, wheat bran, soap, and sulfa antibiotics.  Precautions: None required  Blood-thinner(s): None at this time  Coagulopathies: Reviewed. None identified.   Active Infection(s): Reviewed. None identified. Elizabeth Bowers is afebrile   Location setting: Procedure suite Position: Prone, on modified reverse trendelenburg to facilitate breathing, with head in head-cradle. Pillows positioned under chest (below chin-level) with cervical spine flexed. Safety Precautions: Patient was assessed for positional comfort and pressure points before starting the procedure. Prepping solution: DuraPrep (Iodine Povacrylex [0.7% available iodine] and Isopropyl Alcohol, 74% w/w) Prep Area: Entire  cervicothoracic region Approach: percutaneous, paramedial Intended target: Posterior cervical epidural space Materials: Tray: Epidural Needle(s): Epidural (Tuohy) Qty: 1 Length: (23m) 3.5-inch Gauge: 17G   Meds ordered this encounter  Medications   iohexol (OMNIPAQUE) 180 MG/ML injection 10 mL    Must be Myelogram-compatible. If not available, you may substitute with a water-soluble, non-ionic, hypoallergenic, myelogram-compatible radiological contrast medium.   lidocaine (XYLOCAINE) 2 % (with pres) injection 400 mg   pentafluoroprop-tetrafluoroeth (GEBAUERS) aerosol   lactated ringers infusion   midazolam (VERSED) injection 0.5-2 mg    Make sure Flumazenil is available in the pyxis when using this medication. If oversedation occurs, administer 0.2 mg IV over 15 sec. If after 45 sec no response, administer 0.2 mg again over 1 min; may repeat at 1  min intervals; not to exceed 4 doses (1 mg)   sodium chloride flush (NS) 0.9 %  injection 1 mL   ropivacaine (PF) 2 mg/mL (0.2%) (NAROPIN) injection 1 mL   dexamethasone (DECADRON) injection 10 mg    Orders Placed This Encounter  Procedures   Cervical Epidural Injection    Indication(s): Radiculitis and cervicalgia associated with cervical degenerative disc disease. Position: Prone Imaging guidance: Fluoroscopy required. Contrast required unless contraindicated by allergy or severe CKD. Equipment & Materials: Epidural tray & needle.    Scheduling Instructions:     Procedure: Cervical Epidural Steroid Injection/Block     Planned Level(s): C7-T1     Laterality: Right-sided     Anxiolysis: Patient's choice.     Timeframe: Today    Order Specific Question:   Where will this procedure be performed?    Answer:   [facility]    Comments:   by Dr. Fortunato Curling [facility] C-ARM 1-60 MIN NO REPORT    Intraoperative interpretation by procedural physician at [place].    Standing Status:   Standing    Number of Occurrences:   1    Order Specific Question:   Reason for exam:    Answer:   Assistance in needle guidance and placement for procedures requiring needle placement in or near specific anatomical locations not easily accessible without such assistance.   Informed Consent Details: Physician/Practitioner Attestation; Transcribe to consent form and obtain patient signature    Nursing instructions: Transcribe to consent form and obtain patient signature. Always confirm laterality of pain with Elizabeth Bowers, before procedure.    Order Specific Question:   Physician/Practitioner attestation of informed consent for procedure/surgical case    Answer:   I, the physician/practitioner, attest that I have discussed with the patient the benefits, risks, side effects, alternatives, likelihood of achieving goals and potential problems during recovery for the procedure that I have provided  informed consent.    Order Specific Question:   Procedure    Answer:   Cervical Epidural Steroid Injection (CESI) under fluoroscopic guidance    Order Specific Question:   Physician/Practitioner performing the procedure    Answer:   Fredi Geiler A. Dossie Arbour MD    Order Specific Question:   Indication/Reason    Answer:   Indications: Cervicalgia (neck pain), cervical radicular pain, radiculitis (arm/shoulder pain, numbness, and/or weakness), degenerative disc disease, severe enough to greatly impact quality of life or function.   Provide equipment / supplies at bedside    Procedural tray: Epidural Tray (Disposable  single use) Skin infiltration needle: Regular 1.5-in, 25-G, (x1) Block needle size: Regular standard Catheter: No catheter required    Standing Status:   Standing    Number of Occurrences:   1    Order Specific Question:   Specify    Answer:   Epidural Tray     Time-out: [id number] I initiated and conducted the "Time-out" before starting the procedure, as per protocol. The patient was asked to participate by confirming the accuracy of the "Time Out" information. Verification of the correct person, site, and procedure were performed and confirmed by me, the nursing staff, and the patient. "Time-out" conducted as per Joint Commission's Universal Protocol (UP.01.01.01). Procedure checklist: Completed   H&P (Pre-op  Assessment)  Elizabeth Bowers is a 56 y.o. (year old), female patient, seen today for interventional treatment. She  has a past surgical history that includes Elbow Debridement; Foot surgery; Cholecystectomy; Colonoscopy; Abdominal hysterectomy; Esophagogastroduodenoscopy (egd) with propofol (N/A, 02/06/2018); Colonoscopy with propofol (N/A, 12/22/2019); Colonoscopy with propofol (N/A, 12/23/2019); Image guided sinus surgery (N/A, 09/28/2021); Maxillary  antrostomy (Bilateral, 09/28/2021); Ethmoidectomy (N/A, 09/28/2021); and Sphenoidectomy (Bilateral, 09/28/2021). Elizabeth Bowers has a current medication  list which includes the following prescription(s): amlodipine, bupropion, duloxetine, fluticasone, ipratropium, lansoprazole, linzess, multivitamin with minerals, ondansetron, promethazine-dextromethorphan, triamterene-hydrochlorothiazide, and psyllium, and the following Facility-Administered Medications: lactated ringers. Her primarily concern today is the Neck Pain (Right )  She is allergic to gluten meal, lactose, ranitidine, amphetamine-dextroamphetamine, lactose intolerance (gi), reglan [metoclopramide], topiramate er, wheat bran, soap, and sulfa antibiotics.   Last encounter: My last encounter with her was on 04/19/2022. Pertinent problems: Elizabeth Bowers has Chronic sacroiliac joint pain (Right); Fibromyalgia; Chronic knee pain (Right); DDD (degenerative disc disease), lumbar; Chronic low back pain (2ry area of Pain) (Bilateral) (L>R) w/ sciatica (Bilateral); Chronic lower extremity pain (3ry area of Pain) (Bilateral) (L>R); Chronic neck pain (1ry area of Pain) (Bilateral) (midline); Chronic foot pain (Left); Chronic pain syndrome; Ganglion cyst; Chronic upper extremity pain (4th area of Pain) (Bilateral) (R>L); Cervical Grade 1 (2 mm) Retrolisthesis C5 over C6; Cervical foraminal stenosis (C3-4 & C5-6) (Bilateral); DDD (degenerative disc disease), cervical; Spondylosis without myelopathy or radiculopathy, cervical region; Chronic hip pain (Bilateral) (L>R); Lumbar facet syndrome (Bilateral) (R>L); Spondylosis without myelopathy or radiculopathy, lumbar region; Chronic Sacroiliac joint dysfunction (Bilateral) (R>L); Chronic Somatic dysfunction of sacroiliac joint (Bilateral) (R>L); Osteoarthritis of hip (Bilateral); Cervicogenic headache (Bilateral); Cervicalgia (Bilateral) (R>L); Cervical facet syndrome (Bilateral) (R>L); Chronic occipital neuralgia (Bilateral); Cervical spondylitis with radiculitis (HCC) (C6) (Bilateral) (R>L); Chronic sacroiliac joint pain (Bilateral) (R>L); Abnormal MRI, cervical spine  (07/10/2018); Chronic low back pain (Bilateral) w/o sciatica; Morton's neuroma of foot (Left); Chronic hip pain (Right); Impaired range of motion of hip (Right); Chronic lower extremity pain (Right); Lower extremity radicular pain (Right); Lumbar radiculitis (L3/L4) (Right); and Cervical radiculopathy at C6 (Right) on their pertinent problem list. Pain Assessment: Severity of Chronic pain is reported as a 4 /10. Location: Neck Right/radiates down to hand. Onset: More than a month ago. Quality: Stabbing, Constant. Timing: Constant. Modifying factor(s): rest. Vitals:  height is '5\' 2"'$  (1.575 m) and weight is 172 lb (78 kg). Her temporal temperature is 97.7 F (36.5 C). Her blood pressure is 126/86 and her pulse is 80. Her respiration is 14 and oxygen saturation is 100%.   Reason for encounter: Interventional pain management therapy due pain of at least four (4) weeks in duration, with to failure to respond to and/or inability to tolerate more conservative care.   Site Confirmation: Elizabeth Bowers was asked to confirm the procedure and laterality before marking the site.  Consent: Before the procedure and under the influence of no sedative(s), amnesic(s), or anxiolytics, the patient was informed of the treatment options, risks and possible complications. To fulfill our ethical and legal obligations, as recommended by the American Medical Association's Code of Ethics, I have informed the patient of my clinical impression; the nature and purpose of the treatment or procedure; the risks, benefits, and possible complications of the intervention; the alternatives, including doing nothing; the risk(s) and benefit(s) of the alternative treatment(s) or procedure(s); and the risk(s) and benefit(s) of doing nothing. The patient was provided information about the general risks and possible complications associated with the procedure. These may include, but are not limited to: failure to achieve desired goals, infection,  bleeding, organ or nerve damage, allergic reactions, paralysis, and death. In addition, the patient was informed of those risks and complications associated to Spine-related procedures, such as failure to decrease pain; infection (i.e.: Meningitis, epidural or intraspinal abscess); bleeding (i.e.: epidural hematoma, subarachnoid hemorrhage,  or any other type of intraspinal or peri-dural bleeding); organ or nerve damage (i.e.: Any type of peripheral nerve, nerve root, or spinal cord injury) with subsequent damage to sensory, motor, and/or autonomic systems, resulting in permanent pain, numbness, and/or weakness of one or several areas of the body; allergic reactions; (i.e.: anaphylactic reaction); and/or death. Furthermore, the patient was informed of those risks and complications associated with the medications. These include, but are not limited to: allergic reactions (i.e.: anaphylactic or anaphylactoid reaction(s)); adrenal axis suppression; blood sugar elevation that in diabetics may result in ketoacidosis or comma; water retention that in patients with history of congestive heart failure may result in shortness of breath, pulmonary edema, and decompensation with resultant heart failure; weight gain; swelling or edema; medication-induced neural toxicity; particulate matter embolism and blood vessel occlusion with resultant organ, and/or nervous system infarction; and/or aseptic necrosis of one or more joints. Finally, the patient was informed that Medicine is not an exact science; therefore, there is also the possibility of unforeseen or unpredictable risks and/or possible complications that may result in a catastrophic outcome. The patient indicated having understood very clearly. We have given the patient no guarantees and we have made no promises. Enough time was given to the patient to ask questions, all of which were answered to the patient's satisfaction. Elizabeth Bowers has indicated that she wanted to  continue with the procedure. Attestation: I, the ordering provider, attest that I have discussed with the patient the benefits, risks, side-effects, alternatives, likelihood of achieving goals, and potential problems during recovery for the procedure that I have provided informed consent.  Date  Time: 05/11/2022  8:37 AM   Prophylactic antibiotics  Anti-infectives (From admission, onward)    None      Indication(s): None identified   Description of procedure   Start Time: 0937 hrs  Local Anesthesia: Once the patient was positioned, prepped, and time-out was completed. The target area was identified located. The skin was marked with an approved surgical skin marker. Once marked, the skin (epidermis, dermis, and hypodermis), and deeper tissues (fat, connective tissue and muscle) were infiltrated with a small amount of a short-acting local anesthetic, loaded on a 10cc syringe with a 25G, 1.5-in  Needle. An appropriate amount of time was allowed for local anesthetics to take effect before proceeding to the next step. Local Anesthetic: Lidocaine 1-2% The unused portion of the local anesthetic was discarded in the proper designated containers. Safety Precautions: Aspiration looking for blood return was conducted prior to all injections. At no point did I inject any substances, as a needle was being advanced. Before injecting, the patient was told to immediately notify me if she was experiencing any new onset of "ringing in the ears, or metallic taste in the mouth". No attempts were made at seeking any paresthesias. Safe injection practices and needle disposal techniques used. Medications properly checked for expiration dates. SDV (single dose vial) medications used. After the completion of the procedure, all disposable equipment used was discarded in the proper designated medical waste containers.  Technical description: Protocol guidelines were followed. Using fluoroscopic guidance, the epidural  needle was introduced through the skin, ipsilateral to the reported pain, and advanced to the target area. Posterior laminar os was contacted and the needle walked caudad, until the lamina was cleared. The ligamentum flavum was engaged and the epidural space identified using "loss-of-resistance technique" with 2-3 ml of PF-NaCl (0.9% NSS), in a 5cc dedicated LOR syringe. See "Imaging guidance" below for use of contrast  details.  Injection: Once satisfactory needle placement was confirmed, I proceeded to inject the desired solution in slow, incremental fashion, intermittently assessing for discomfort or any signs of abnormal or undesired spread of substance. Once completed, the needle was removed and disposed of, as per hospital protocols.   Vitals:   05/11/22 0935 05/11/22 0940 05/11/22 0943 05/11/22 0950  BP: (!) 129/92 (!) 131/93 (!) 129/91 126/86  Pulse: 71 68 75 80  Resp: '16 15 12 14  '$ Temp:      TempSrc:      SpO2: 97% 100% 100% 100%  Weight:      Height:        End Time:   hrs  Once the entire procedure was completed, the treated area was cleaned, making sure to leave some of the prepping solution back to take advantage of its long term bactericidal properties.   Imaging guidance  Type of Imaging Technique: Fluoroscopy Guidance (Spinal) Indication(s): Assistance in needle guidance and placement for procedures requiring needle placement in or near specific anatomical locations not easily accessible without such assistance. Exposure Time: Please see nurses notes for exact fluoroscopy time. Contrast: Before injecting any contrast, we confirmed that the patient did not have an allergy to iodine, shellfish, or radiological contrast. Once satisfactory needle placement was completed, radiological contrast was injected under continuous fluoroscopic guidance. Injection of contrast accomplished without complications. See chart for type and volume of contrast used. Fluoroscopic Guidance: I was  personally present in the fluoroscopy suite, where the patient was placed in position for the procedure, over the fluoroscopy-compatible table. Fluoroscopy was manipulated, using "Tunnel Vision Technique", to obtain the best possible view of the target area, on the affected side. Parallax error was corrected before commencing the procedure. A "direction-depth-direction" technique was used to introduce the needle under continuous pulsed fluoroscopic guidance. Once the target was reached, antero-posterior, oblique, and lateral fluoroscopic projection views were taken to confirm needle placement in all planes. Electronic images uploaded into EMR.  Interpretation: Successful epidural injection. Intraoperative imaging interpretation by performing Physician.    Post-op assessment  Post-procedure Vital Signs:  Pulse/HCG Rate: 80  Temp: 97.7 F (36.5 C) Resp: 14 BP: 126/86 SpO2: 100 %  EBL: None  Complications: No immediate post-treatment complications observed by team, or reported by patient.  Note: The patient tolerated the entire procedure well. A repeat set of vitals were taken after the procedure and the patient was kept under observation following institutional policy, for this type of procedure. Post-procedural neurological assessment was performed, showing return to baseline, prior to discharge. The patient was provided with post-procedure discharge instructions, including a section on how to identify potential problems. Should any problems arise concerning this procedure, the patient was given instructions to immediately contact us, at any time, without hesitation. In any case, we plan to contact the patient by telephone for a follow-up status report regarding this interventional procedure.  Comments:  No additional relevant information.   Plan of care  Chronic Opioid Analgesic:  No chronic opioid analgesics therapy prescribed by our practice.   Medications administered: We administered  iohexol, lidocaine, pentafluoroprop-tetrafluoroeth, lactated ringers, midazolam, sodium chloride flush, ropivacaine (PF) 2 mg/mL (0.2%), and dexamethasone.  Follow-up plan:   Return in about 2 weeks (around 05/25/2022) for Proc-day (T,Th), (VV), (PPE).      Interventional Therapies  Risk  Complexity Considerations:   Estimated body mass index is 32.01 kg/m as calculated from the following:   Height as of this encounter: '5\' 2"'$  (1.575 m).  Weight as of this encounter: 175 lb (79.4 kg). WNL   Planned  Pending:   Oral prednisone taper ordered today 04/04/2022.   Under consideration:   Diagnostic/therapeutic right cervical ESI #2 (last 1 done on 06/27/2018)    Completed:   Diagnostic/therapeutic right L3-4 LESI x1 (09/08/2021) (100/100/100/100)  Palliative bilateral cervical facet MBB x2 (06/13/2018) (100/100/0/<25)  Diagnostic bilateral lumbar facet MBB x2 (08/08/2018) [facility])  Diagnostic right Cervical ESI x1 (06/27/2018) (100/20/70/50-75)  Therapeutic right cervical facet RFA x1 (07/23/2018) (100/50/0)    Completed by other providers:   Therapeutic sphenopalatine ganglion Blk by Dr. Brigitte Pulse x2 (03/04/2018) Diagnostic bilateral upper extremity EMG/PNCV by Dr. Brigitte Pulse: Right Grade II & Left Grade I CTS (08/23/2018)    Therapeutic  Palliative (PRN) options:   Therapeutic/palliative cervical facet MBB   Therapeutic/palliativelumbar facet MBB   Therapeutic/palliative Cervical ESI   Therapeutic/palliative cervical facet RFA       Recent Visits Date Type Provider Dept  04/19/22 Office Visit Milinda Pointer, MD [facility]  04/04/22 Office Visit Milinda Pointer, MD [facility]  Showing recent visits within past 90 days and meeting all other requirements Today's Visits Date Type Provider Dept  05/11/22 Procedure visit Milinda Pointer, MD [facility]  Showing today's visits and meeting all other requirements Future Appointments Date Type Provider  Dept  05/25/22 Appointment Milinda Pointer, MD [facility]  Showing future appointments within next 90 days and meeting all other requirements   Disposition: Discharge home  Discharge (Date  Time): 05/11/2022; 1000 hrs.   Primary Care Physician: Virginia Crews, MD Location: [facility] Note by: Gaspar Cola, MD Date: 05/11/2022; Time: 10:01 AM  DISCLAIMER: Medicine is not an Chief Strategy Officer. It has no guarantees or warranties. The decision to proceed with this intervention was based on the information collected from the patient. Conclusions were drawn from the patient's questionnaire, interview, and examination. Because information was provided in large part by the patient, it cannot be guaranteed that it has not been purposely or unconsciously manipulated or altered. Every effort has been made to obtain as much accurate, relevant, available data as possible. Always take into account that the treatment will also be dependent on availability of resources and existing treatment guidelines, considered by other Pain Management Specialists as being common knowledge and practice, at the time of the intervention. It is also important to point out that variation in procedural techniques and pharmacological choices are the acceptable norm. For Medico-Legal review purposes, the indications, contraindications, technique, and results of the these procedures should only be evaluated, judged and interpreted by a Board-Certified Interventional Pain Specialist with extensive familiarity and expertise in the same exact procedure and technique.

## 2022-05-11 NOTE — Patient Instructions (Signed)
____________________________________________________________________________________________  Virtual Visits   What is a "Virtual Visit"? It is a healthcare communication encounter (medical visit) that takes place on real time (NOT TEXT or E-MAIL) over the telephone or computer device (desktop, laptop, tablet, smart phone, etc.). It allows for more location flexibility between the patient and the healthcare provider.  Who decides when these types of visits will be used? The physician.  Who is eligible for these types of visits? Only those patients that can be reliably reached over the telephone.  What do you mean by reliably? We do not have time to call everyone multiple times, therefore those that tend to screen calls and then call back later are not suitable candidates for this system. We understand how people are reluctant to pickup on "unknown" calls, therefore, we suggest adding our telephone numbers to your list of "CONTACT(s)". This way, you should be able to readily identify our calls when you receive one. All of our numbers are available below.   Who is not eligible? This option is not available for medication management encounters, specially for controlled substances. Patients on pain medications that fall under the category of controlled substances have to come in for "Face-to-Face" encounters. This is required for mandatory monitoring of these substances. You may be asked to provide a sample for an unannounced urine drug screening test (UDS), and we will need to count your pain pills. Not bringing your pills to be counted may result in no refill. Obviously, neither one of these can be done over the phone.  When will this type of visits be used? You can request a virtual visit whenever you are physically unable to attend a regular appointment. The decision will be made by the physician (or healthcare provider) on a case by case basis.   At what time will I be called? This is an  excellent question. The providers will try to call you whenever they have time available. Do not expect to be called at any specific time. The secretaries will assign you a time for your virtual visit appointment, but this is done simply to keep a list of those patients that need to be called, but not for the purpose of keeping a time schedule. Be advised that the call may come in anytime during the day, between the hours of 8:00 AM and 8::00 PM, depending on provider availability. We do understand that the system is not perfect. If you are unable to be available that day on a moments notice, then request an "in-person" appointment rather than a "virtual visit".  Can I request my medication visits to be "Virtual"? Yes you may request it, but the decision is entirely up to the healthcare provider. Control substances require specific monitoring that requires Face-to-Face encounters. The number of encounters  and the extent of the monitoring is determined on a case by case basis.  Add a new contact to your smart phone and label it "PAIN CLINIC" Under this contact add the following numbers: Main: [phone] (Official Contact Number) Nurses: [phone] (These are outgoing only calling systems. Do not call this number.) Dr. Jamilee Lafosse: [phone] or [phone] (Outgoing calls only. Do not call this number.)  ____________________________________________________________________________________________    ____________________________________________________________________________________________  Post-Procedure Discharge Instructions  Instructions: Apply ice:  Purpose: This will minimize any swelling and discomfort after procedure.  When: Day of procedure, as soon as you get home. How: Fill a plastic sandwich bag with crushed ice. Cover it with a small towel and apply   to injection site. How long: (15 min on, 15 min off) Apply for 15 minutes then remove x 15 minutes.  Repeat sequence on  day of procedure, until you go to bed. Apply heat:  Purpose: To treat any soreness and discomfort from the procedure. When: Starting the next day after the procedure. How: Apply heat to procedure site starting the day following the procedure. How long: May continue to repeat daily, until discomfort goes away. Food intake: Start with clear liquids (like water) and advance to regular food, as tolerated.  Physical activities: Keep activities to a minimum for the first 8 hours after the procedure. After that, then as tolerated. Driving: If you have received any sedation, be responsible and do not drive. You are not allowed to drive for 24 hours after having sedation. Blood thinner: (Applies only to those taking blood thinners) You may restart your blood thinner 6 hours after your procedure. Insulin: (Applies only to Diabetic patients taking insulin) As soon as you can eat, you may resume your normal dosing schedule. Infection prevention: Keep procedure site clean and dry. Shower daily and clean area with soap and water. Post-procedure Pain Diary: Extremely important that this be done correctly and accurately. Recorded information will be used to determine the next step in treatment. For the purpose of accuracy, follow these rules: Evaluate only the area treated. Do not report or include pain from an untreated area. For the purpose of this evaluation, ignore all other areas of pain, except for the treated area. After your procedure, avoid taking a long nap and attempting to complete the pain diary after you wake up. Instead, set your alarm clock to go off every hour, on the hour, for the initial 8 hours after the procedure. Document the duration of the numbing medicine, and the relief you are getting from it. Do not go to sleep and attempt to complete it later. It will not be accurate. If you received sedation, it is likely that you were given a medication that may cause amnesia. Because of this,  completing the diary at a later time may cause the information to be inaccurate. This information is needed to plan your care. Follow-up appointment: Keep your post-procedure follow-up evaluation appointment after the procedure (usually 2 weeks for most procedures, 6 weeks for radiofrequencies). DO NOT FORGET to bring you pain diary with you.   Expect: (What should I expect to see with my procedure?) From numbing medicine (AKA: Local Anesthetics): Numbness or decrease in pain. You may also experience some weakness, which if present, could last for the duration of the local anesthetic. Onset: Full effect within 15 minutes of injected. Duration: It will depend on the type of local anesthetic used. On the average, 1 to 8 hours.  From steroids (Applies only if steroids were used): Decrease in swelling or inflammation. Once inflammation is improved, relief of the pain will follow. Onset of benefits: Depends on the amount of swelling present. The more swelling, the longer it will take for the benefits to be seen. In some cases, up to 10 days. Duration: Steroids will stay in the system x 2 weeks. Duration of benefits will depend on multiple posibilities including persistent irritating factors. Side-effects: If present, they may typically last 2 weeks (the duration of the steroids). Frequent: Cramps (if they occur, drink Gatorade and take over-the-counter Magnesium 450-500 mg once to twice a day); water retention with temporary weight gain; increases in blood sugar; decreased immune system response; increased appetite. Occasional: Facial flushing (  red, warm cheeks); mood swings; menstrual changes. Uncommon: Long-term decrease or suppression of natural hormones; bone thinning. (These are more common with higher doses or more frequent use. This is why we prefer that our patients avoid having any injection therapies in other practices.)  Very Rare: Severe mood changes; psychosis; aseptic necrosis. From  procedure: Some discomfort is to be expected once the numbing medicine wears off. This should be minimal if ice and heat are applied as instructed.  Call if: (When should I call?) You experience numbness and weakness that gets worse with time, as opposed to wearing off. New onset bowel or bladder incontinence. (Applies only to procedures done in the spine)  Emergency Numbers: Durning business hours (Monday - Thursday, 8:00 AM - 4:00 PM) (Friday, 9:00 AM - 12:00 Noon): [phone] After hours: [phone] NOTE: If you are having a problem and are unable connect with, or to talk to a provider, then go to your nearest urgent care or emergency department. If the problem is serious and urgent, please call 911. ____________________________________________________________________________________________    ____________________________________________________________________________________________  Patient Information update  To: All of our patients.  Re: Name change.  It has been made official that our current name, "[facility]"   will soon be changed to "[facility]".   The purpose of this change is to eliminate any confusion created by the concept of our practice being a "Medication Management Pain Clinic". In the past this has led to the misconception that we treat pain primarily by the use of prescription medications.  Nothing can be farther from the truth.   Understanding PAIN MANAGEMENT: To further understand what our practice does, you first have to understand that "Pain Management" is a subspecialty that requires additional training once a physician has completed their specialty training, which can be in either Anesthesia, Neurology, Psychiatry, or Physical Medicine and Rehabilitation (PMR). Each one of these contributes to the final approach taken by each physician to the  management of their patient's pain. To be a "Pain Management Specialist" you must have first completed one of the specialty trainings below.  Anesthesiologists - trained in clinical pharmacology and interventional techniques such as nerve blockade and regional as well as central neuroanatomy. They are trained to block pain before, during, and after surgical interventions.  Neurologists - trained in the diagnosis and pharmacological treatment of complex neurological conditions, such as Multiple Sclerosis, Parkinson's, spinal cord injuries, and other systemic conditions that may be associated with symptoms that may include but are not limited to pain. They tend to rely primarily on the treatment of chronic pain using prescription medications.  Psychiatrist - trained in conditions affecting the psychosocial wellbeing of patients including but not limited to depression, anxiety, schizophrenia, personality disorders, addiction, and other substance use disorders that may be associated with chronic pain. They tend to rely primarily on the treatment of chronic pain using prescription medications.   Physical Medicine and Rehabilitation (PMR) physicians, also known as physiatrists - trained to treat a wide variety of medical conditions affecting the brain, spinal cord, nerves, bones, joints, ligaments, muscles, and tendons. Their training is primarily aimed at treating patients that have suffered injuries that have caused severe physical impairment. Their training is primarily aimed at the physical therapy and rehabilitation of those patients. They may also work alongside orthopedic surgeons or neurosurgeons using their expertise in assisting surgical patients to recover after their surgeries.  INTERVENTIONAL PAIN MANAGEMENT  is sub-subspecialty of Pain Management.  Our physicians are Board-certified in Anesthesia, Pain Management, and Interventional Pain Management.  This meaning that not only have they been  trained and Board-certified in their specialty of Anesthesia, and subspecialty of Pain Management, but they have also received further training in the sub-subspecialty of Interventional Pain Management, in order to become Board-certified as INTERVENTIONAL PAIN MANAGEMENT SPECIALIST.    Mission: Our goal is to use our skills in  [place] as alternatives to the chronic use of prescription opioid medications for the treatment of pain. To make this more clear, we have changed our name to reflect what we do and offer. We will continue to offer medication management assessment and recommendations, but we will not be taking over any patient's medication management.  ____________________________________________________________________________________________

## 2022-05-12 NOTE — Progress Notes (Signed)
Callled PP. Denies any needs at this time. Instructed to call if needed.

## 2022-05-14 ENCOUNTER — Emergency Department
Admission: EM | Admit: 2022-05-14 | Discharge: 2022-05-14 | Disposition: A | Discharge status: Home / Self Care | Payer: 59 | Attending: Emergency Medicine | Admitting: Emergency Medicine

## 2022-05-14 ENCOUNTER — Other Ambulatory Visit: Payer: Self-pay

## 2022-05-14 ENCOUNTER — Encounter: Payer: Self-pay | Admitting: Emergency Medicine

## 2022-05-14 DIAGNOSIS — J029 Acute pharyngitis, unspecified: Secondary | ICD-10-CM | POA: Diagnosis not present

## 2022-05-14 DIAGNOSIS — Z20822 Contact with and (suspected) exposure to covid-19: Secondary | ICD-10-CM | POA: Diagnosis not present

## 2022-05-14 DIAGNOSIS — B349 Viral infection, unspecified: Secondary | ICD-10-CM | POA: Diagnosis not present

## 2022-05-14 DIAGNOSIS — R509 Fever, unspecified: Secondary | ICD-10-CM | POA: Diagnosis present

## 2022-05-14 LAB — RESP PANEL BY RT-PCR (RSV, FLU A&B, COVID)  RVPGX2
Influenza A by PCR: NEGATIVE
Influenza B by PCR: NEGATIVE
Resp Syncytial Virus by PCR: NEGATIVE
SARS Coronavirus 2 by RT PCR: NEGATIVE

## 2022-05-14 LAB — GROUP A STREP BY PCR: Group A Strep by PCR: NOT DETECTED

## 2022-05-14 MED ORDER — AMOXICILLIN-POT CLAVULANATE 125-31.25 MG/5ML PO SUSR
125.0000 mg | Freq: Two times a day (BID) | ORAL | 0 refills | Status: DC
  Filled 2022-05-14: qty 70, 7d supply, fill #0

## 2022-05-14 NOTE — ED Triage Notes (Signed)
Pt to ED after working today c/o fever, sore throat, body aches, and ear pain.  States fever 100.3 earlier and took dayquil last around 1630.  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

## 2022-05-14 NOTE — ED Provider Notes (Signed)
[facility] Provider Note  Patient Contact: 8:24 PM (approximate)   History   Generalized Body Aches, Sore Throat, and Fever   HPI  Elizabeth Bowers is a 56 y.o. female who presents emergency department complaining of congestion, postnasal drip, sore throat, cough, headache, body aches.  Symptoms began this morning.  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  Patient with no chest pain, shortness of breath, GI symptoms.     Physical Exam   Triage Vital Signs: ED Triage Vitals  Enc Vitals Group     BP 05/14/22 1924 138/79     Pulse Rate 05/14/22 1924 90     Resp 05/14/22 1924 16     Temp 05/14/22 1924 99 F (37.2 C)     Temp Source 05/14/22 1924 Oral     SpO2 05/14/22 1924 96 %     Weight 05/14/22 1922 175 lb (79.4 kg)     Height 05/14/22 1922 5\' 2"  (1.575 m)     Head Circumference --      Peak Flow --      Pain Score 05/14/22 1922 7     Pain Loc --      Pain Edu? --      Excl. in GC? --     Most recent vital signs: Vitals:   05/14/22 1924  BP: 138/79  Pulse: 90  Resp: 16  Temp: 99 F (37.2 C)  SpO2: 96%     General: Alert and in no acute distress. ENT:      Ears:       Nose: No congestion/rhinnorhea.  Tenderness over maxillary sinuses      Mouth/Throat: Mucous membranes are moist.  No gross oropharyngeal erythema or edema.  Uvula is midline.  No exudates. Neck: No stridor.   Cardiovascular:  Good peripheral perfusion Respiratory: Normal respiratory effort without tachypnea or retractions. Lungs CTAB.  Musculoskeletal: Full range of motion to all extremities.  Neurologic:  No gross focal neurologic deficits are appreciated.  Skin:   No rash noted Other:   ED Results / Procedures / Treatments   Labs (all labs ordered are listed, but only abnormal results are displayed) Labs Reviewed  RESP PANEL BY RT-PCR (RSV, FLU A&B, COVID)  [id number]  GROUP A STREP BY PCR      EKG     RADIOLOGY    No results found.  PROCEDURES:  Critical Care performed: No  Procedures   MEDICATIONS ORDERED IN ED: Medications - No data to display   IMPRESSION / MDM / ASSESSMENT AND PLAN / ED COURSE  I reviewed the triage vital signs and the nursing notes.                              Differential diagnosis includes, but is not limited to, viral illness, COVID, flu, RSV, sinusitis  Patient's presentation is most consistent with acute presentation with potential threat to life or bodily function.   Patient's diagnosis is consistent with viral illness per patient presents emergency department with onset of symptoms consistent with viral illness.  Patient is COVID, flu and RSV testing currently in progress.  Patient also has strep testing in progress.  At this time I will discharge the patient.  She has a history of chronic sinusitis, is on medications daily for same.  I will prescribe an antibiotic if symptoms seem to localize only to the sinuses  but again had a lengthy discussion with the patient that I think that this is likely viral.  Patient is able to follow the results on MyChart and will follow testing there.  Return precautions discussed with the patient.  Follow-up with primary care as needed..  Patient is given ED precautions to return to the ED for any worsening or new symptoms.        FINAL CLINICAL IMPRESSION(S) / ED DIAGNOSES   Final diagnoses:  Viral illness     Rx / DC Orders   ED Discharge Orders          Ordered    amoxicillin-clavulanate (AUGMENTIN) 125-31.25 MG/5ML suspension  2 times daily        05/14/22 2027             Note:  This document was prepared using Dragon voice recognition software and may include unintentional dictation errors.   Lanette Hampshire 05/14/22 2027    Jene Every, MD 05/15/22 1146

## 2022-05-15 ENCOUNTER — Ambulatory Visit: Payer: Self-pay

## 2022-05-15 ENCOUNTER — Telehealth: Payer: Self-pay | Admitting: Family Medicine

## 2022-05-15 ENCOUNTER — Other Ambulatory Visit: Payer: Self-pay

## 2022-05-15 MED ORDER — AMOXICILLIN-POT CLAVULANATE 875-125 MG PO TABS
1.0000 | ORAL_TABLET | Freq: Two times a day (BID) | ORAL | 0 refills | Status: DC
  Filled 2022-05-15: qty 14, 7d supply, fill #0

## 2022-05-15 NOTE — Telephone Encounter (Signed)
Disp Refills Start End   amoxicillin-clavulanate (AUGMENTIN) 125-31.25 MG/5ML suspension 70 mL 0 05/14/2022 05/21/2022   Sig - Route: Take 5 mLs (125 mg total) by mouth 2 (two) times daily for 7 days. - Oral   Sent to pharmacy as: amoxicillin-clavulanate (AUGMENTIN) 125-31.25 MG/5ML suspension   Pt is calling, she was in the ER and states this med was prescribed by ER doc and pt states Dr B was going to call the [facility] pharmacy to make sure pt got script in a timely fashion. Pt asking for nurse or DR B to touch base with her as husband is waiting at the pharmacy. [phone]

## 2022-05-15 NOTE — Telephone Encounter (Signed)
Please send in Augmentin 875 mg BID x7d #14 r0 to replace the liquid dose sent by ED

## 2022-05-15 NOTE — Telephone Encounter (Signed)
  Pt. Reports seen in ED yesterday and prescribed Augmentin "the dose is wrong."Pharmacist, community at [place], and she reports dose is sub-therapeutic. States she contacted ED this morning and has not heard back. Pt. Asking if PCP can assist with this issue. Please advise.     Answer Assessment - Initial Assessment Questions 1. NAME of MEDICINE: "What medicine(s) are you calling about?"     Augmentin 2. QUESTION: "What is your question?" (e.g., double dose of medicine, side effect)     ED provider prescribed medication 05/14/22. Pharmacist, Alyse Low, at [facility] states dose is sub-therapeutic and has contacted ED this morning and has not heard back.  3. PRESCRIBER: "Who prescribed the medicine?" Reason: if prescribed by specialist, call should be referred to that group.     ED 4. SYMPTOMS: "Do you have any symptoms?" If Yes, ask: "What symptoms are you having?"  "How bad are the symptoms (e.g., mild, moderate, severe)     N/A 5. PREGNANCY:  "Is there any chance that you are pregnant?" "When was your last menstrual period?"     No  Protocols used: Medication Question Call-A-AH

## 2022-05-15 NOTE — Telephone Encounter (Signed)
See other message - was sent in already

## 2022-05-21 NOTE — Progress Notes (Deleted)
Patient: Elizabeth Bowers  Service Category: E/M  Provider: Gaspar Cola, MD  DOB: 01-Oct-1965  DOS: 05/23/2022  Location: Office  MRN: [id number]  Setting: Ambulatory outpatient  Referring Provider: Virginia Crews, MD  Type: Established Patient  Specialty: Interventional Pain Management  PCP: Virginia Crews, MD  Location: Remote location  Delivery: TeleHealth     Virtual Encounter - Pain Management PROVIDER NOTE: Information contained herein reflects review and annotations entered in association with encounter. Interpretation of such information and data should be left to medically-trained personnel. Information provided to patient can be located elsewhere in the medical record under "Patient Instructions". Document created using STT-dictation technology, any transcriptional errors that may result from process are unintentional.    Contact & Pharmacy Preferred: [phone] Home: [phone] (home) Mobile: [phone] (mobile) E-mail: [email or web address]  [place] [address] Phone: [phone] Fax: [place] [place] [address] Phone: [phone] Fax: [phone]   Pre-screening  [name] offered "in-person" vs "virtual" encounter. She indicated preferring virtual for this encounter.   Reason COVID-19*  Social distancing based on CDC and AMA recommendations.   I contacted [place] on 05/23/2022 via telephone.      I clearly identified myself as Gaspar Cola, MD. I verified that I was speaking with the correct person using two identifiers (Name: Elizabeth Bowers, and date of birth: November 13, 1965).  Consent I sought verbal advanced consent from [place] for virtual visit interactions. I informed [name] of possible security and privacy concerns, risks, and limitations associated with providing  "not-in-person" medical evaluation and management services. I also informed [name] of the availability of "in-person" appointments. Finally, I informed her that there would be a charge for the virtual visit and that she could be  personally, fully or partially, financially responsible for it. Ms. [place] expressed understanding and agreed to proceed.   Historic Elements   [name] is a 56 y.o. year old, female patient evaluated today after our last contact on 05/11/2022. Ms. Steptoe  has a past medical history of Fatty liver, Fibromyalgia, GERD (gastroesophageal reflux disease), Hemorrhoids, Hot flashes, Hypertension, IBS (irritable bowel syndrome), LUQ abdominal pain (10/25/2016), Migraines, Miscarriage, Multilevel degenerative disc disease, Neuroma, and Vocal cord dysfunction. She also  has a past surgical history that includes Elbow Debridement; Foot surgery; Cholecystectomy; Colonoscopy; Abdominal hysterectomy; Esophagogastroduodenoscopy (egd) with propofol (N/A, 02/06/2018); Colonoscopy with propofol (N/A, 12/22/2019); Colonoscopy with propofol (N/A, 12/23/2019); Image guided sinus surgery (N/A, 09/28/2021); Maxillary antrostomy (Bilateral, 09/28/2021); Ethmoidectomy (N/A, 09/28/2021); and Sphenoidectomy (Bilateral, 09/28/2021). [name] has a current medication list which includes the following prescription(s): amlodipine, amoxicillin-clavulanate, bupropion, duloxetine, fluticasone, ipratropium, lansoprazole, linzess, multivitamin with minerals, ondansetron, promethazine-dextromethorphan, and triamterene-hydrochlorothiazide. She  reports that she quit smoking about 34 years ago. Her smoking use included cigarettes. She has a 3.75 pack-year smoking history. She has never used smokeless tobacco. She reports current alcohol use of about 2.0 - 4.0 standard drinks of alcohol per week. She reports that she does not use drugs. [name] is allergic to gluten meal, lactose, ranitidine, amphetamine-dextroamphetamine,  lactose intolerance (gi), reglan [metoclopramide], topiramate er, wheat bran, soap, and sulfa antibiotics.  Estimated body mass index is 32.01 kg/m as calculated from the following:   Height as of 05/14/22: _1  (1.575 m).   Weight as of 05/14/22: 175 lb (79.4 kg).  HPI  Today, she is  being contacted for a post-procedure assessment.  Post-procedure evaluation   Type: Cervical Epidural Steroid injection (ESI) (Interlaminar) #2  Laterality: Right  Level: C7-T1 Imaging: Fluoroscopy-assisted DOS: 05/11/2022  Performed by: Milinda Pointer, MD Anesthesia: Local anesthesia (1-2% Lidocaine) Anxiolysis: IV Versed 2.0 mg Sedation: No Sedation                         Purpose: Diagnostic/Therapeutic Indications: Cervicalgia, cervical radicular pain, degenerative disc disease, severe enough to impact quality of life or function. 1. Cervical radiculopathy at C6 (Right)   2. Cervicalgia (Bilateral) (R>L)   3. Cervical Grade 1 (2 mm) Retrolisthesis C5 over C6   4. 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 Cervical spondylitis with radiculitis (HCC) (C6) (Bilateral) (R>L)   6. Cervicogenic headache (Bilateral)   7. DDD (degenerative disc disease), cervical    NAS-11 score:   Pre-procedure: 4 /10   Post-procedure: 3 /10      Effectiveness:  Initial hour after procedure:   ***. Subsequent 4-6 hours post-procedure:   ***. Analgesia past initial 6 hours:   ***. Ongoing improvement:  Analgesic:  *** Function:    ***    ROM:    ***     Pharmacotherapy Assessment   Opioid Analgesic: No chronic opioid analgesics therapy prescribed by our practice.   Monitoring: [place] PMP: PDMP reviewed during this encounter.       Pharmacotherapy: No side-effects or adverse reactions reported. Compliance: No problems identified. Effectiveness: Clinically acceptable. Plan: Refer to "POC". UDS:  Summary  Date Value Ref Range Status  01/16/2018 FINAL  Final    Comment:     ==================================================================== TOX[facility]RE COMP DRUG ANALYSIS,UR ==================================================================== Test                             Result       Flag       Units Drug Present and Declared for Prescription Verification   Oxazepam                       51           EXPECTED   ng/mg creat   Temazepam                      44           EXPECTED   ng/mg creat    Oxazepam and temazepam are expected metabolites of diazepam.    Oxazepam is also an expected metabolite of other benzodiazepine    drugs, including chlordiazepoxide, prazepam, clorazepate,    halazepam, and temazepam.  Oxazepam and temazepam are available    as scheduled prescription medications.   Phentermine                    PRESENT      EXPECTED   Duloxetine                     PRESENT      EXPECTED Drug Present not Declared for Prescription Verification   Topiramate                     PRESENT      UNEXPECTED   Acetaminophen                  PRESENT      UNEXPECTED Drug  Absent but Declared for Prescription Verification   Cyclobenzaprine                Not Detected UNEXPECTED   Promethazine                   Not Detected UNEXPECTED ==================================================================== Test                      Result    Flag   Units      Ref Range   Creatinine              57               mg/dL      >=20 ==================================================================== Declared Medications:  The flagging and interpretation on this report are based on the  following declared medications.  Unexpected results may arise from  inaccuracies in the declared medications.  **Note: The testing scope of this panel includes these medications:  Cyclobenzaprine  Diazepam  Duloxetine  Phentermine  Promethazine  **Note: The testing scope of this panel does not include following  reported medications:  Acyclovir  Budesonide  Docusate   Hydrochlorothiazide (Triamterine-Hydrochlorthzide)  Levocetirizine  Linaclotide  Meclizine  Montelukast  Multivitamin  Potassium  Triamterene (Triamterine-Hydrochlorthzide)  Valacyclovir ==================================================================== For clinical consultation, please call [phone]. ====================================================================    No results found for: "CBDTHCR", "D8THCCBX", "D9THCCBX"   Laboratory Chemistry Profile   Renal Lab Results  Component Value Date   BUN 16 04/07/2022   CREATININE 1.19 (H) 04/07/2022   BCR 13 04/07/2022   GFRAA >60 07/07/2019   GFRNONAA 56 (L) 05/05/2021    Hepatic Lab Results  Component Value Date   AST 31 04/07/2022   ALT 46 (H) 04/07/2022   ALBUMIN 4.8 04/07/2022   ALKPHOS 96 04/07/2022   LIPASE 33 12/15/2016    Electrolytes Lab Results  Component Value Date   NA 139 04/07/2022   K 3.7 04/07/2022   CL 98 04/07/2022   CALCIUM 10.1 04/07/2022   MG 2.2 01/16/2018    Bone Lab Results  Component Value Date   25OHVITD1 51 01/16/2018   25OHVITD2 <1.0 01/16/2018   25OHVITD3 50 01/16/2018    Inflammation (CRP: Acute Phase) (ESR: Chronic Phase) Lab Results  Component Value Date   CRP 0.8 07/05/2021   ESRSEDRATE 17 07/05/2021         Note: Above Lab results reviewed.  Imaging  DG PAIN CLINIC C-ARM 1-60 MIN NO REPORT Fluoro was used, but no Radiologist interpretation will be provided.  Please refer to "NOTES" tab for provider progress note.  Assessment  There were no encounter diagnoses.  Plan of Care  Problem-specific:  No problem-specific Assessment & Plan notes found for this encounter.  Ms. Tanielle Emigh has a current medication list which includes the following long-term medication(s): amlodipine, bupropion, duloxetine, fluticasone, ipratropium, lansoprazole, linzess, and triamterene-hydrochlorothiazide.  Pharmacotherapy (Medications Ordered): No orders of the defined  types were placed in this encounter.  Orders:  No orders of the defined types were placed in this encounter.  Follow-up plan:   No follow-ups on file.     Interventional Therapies  Risk  Complexity Considerations:   Estimated body mass index is 32.01 kg/m as calculated from the following:   Height as of this encounter: _0  (1.575 m).   Weight as of this encounter: 175 lb (79.4 kg). WNL   Planned  Pending:   Oral prednisone taper ordered today 04/04/2022.   Under  consideration:   Diagnostic/therapeutic right cervical ESI #2 (last 1 done on 06/27/2018)    Completed:   Diagnostic/therapeutic right L3-4 LESI x1 (09/08/2021) (100/100/100/100)  Palliative bilateral cervical facet MBB x2 (06/13/2018) (100/100/0/<25)  Diagnostic bilateral lumbar facet MBB x2 (08/08/2018) [facility])  Diagnostic right Cervical ESI x1 (06/27/2018) (100/20/70/50-75)  Therapeutic right cervical facet RFA x1 (07/23/2018) (100/50/0)    Completed by other providers:   Therapeutic sphenopalatine ganglion Blk by Dr. Brigitte Pulse x2 (03/04/2018) Diagnostic bilateral upper extremity EMG/PNCV by Dr. Brigitte Pulse: Right Grade II & Left Grade I CTS (08/23/2018)    Therapeutic  Palliative (PRN) options:   Therapeutic/palliative cervical facet MBB   Therapeutic/palliativelumbar facet MBB   Therapeutic/palliative Cervical ESI   Therapeutic/palliative cervical facet RFA        Recent Visits Date Type Provider Dept  05/11/22 Procedure visit Milinda Pointer, MD [facility]  04/19/22 Office Visit Milinda Pointer, MD [facility]  04/04/22 Office Visit Milinda Pointer, MD [facility]  Showing recent visits within past 90 days and meeting all other requirements Future Appointments Date Type Provider Dept  05/23/22 Appointment Milinda Pointer, MD [facility]  Showing future appointments within next 90 days and meeting all other requirements  I discussed the assessment and treatment  plan with the patient. The patient was provided an opportunity to ask questions and all were answered. The patient agreed with the plan and demonstrated an understanding of the instructions.  Patient advised to call back or seek an in-person evaluation if the symptoms or condition worsens.  Duration of encounter: *** minutes.  Note by: Gaspar Cola, MD Date: 05/23/2022; Time: 11:23 AM

## 2022-05-22 ENCOUNTER — Ambulatory Visit: Payer: 59 | Attending: Pain Medicine | Admitting: Pain Medicine

## 2022-05-22 DIAGNOSIS — M4692 Unspecified inflammatory spondylopathy, cervical region: Secondary | ICD-10-CM | POA: Diagnosis not present

## 2022-05-22 DIAGNOSIS — G4486 Cervicogenic headache: Secondary | ICD-10-CM

## 2022-05-22 DIAGNOSIS — M5030 Other cervical disc degeneration, unspecified cervical region: Secondary | ICD-10-CM | POA: Diagnosis not present

## 2022-05-22 DIAGNOSIS — M4310 Spondylolisthesis, site unspecified: Secondary | ICD-10-CM

## 2022-05-22 DIAGNOSIS — G8929 Other chronic pain: Secondary | ICD-10-CM

## 2022-05-22 DIAGNOSIS — M79601 Pain in right arm: Secondary | ICD-10-CM | POA: Diagnosis not present

## 2022-05-22 DIAGNOSIS — M79602 Pain in left arm: Secondary | ICD-10-CM

## 2022-05-22 DIAGNOSIS — M4802 Spinal stenosis, cervical region: Secondary | ICD-10-CM | POA: Diagnosis not present

## 2022-05-22 DIAGNOSIS — R937 Abnormal findings on diagnostic imaging of other parts of musculoskeletal system: Secondary | ICD-10-CM

## 2022-05-22 DIAGNOSIS — M5412 Radiculopathy, cervical region: Secondary | ICD-10-CM | POA: Diagnosis not present

## 2022-05-22 DIAGNOSIS — M542 Cervicalgia: Secondary | ICD-10-CM

## 2022-05-22 NOTE — Progress Notes (Signed)
Patient: Allyne Hebert  Service Category: E/M  Provider: Gaspar Cola, MD  DOB: 1966-04-06  DOS: 05/22/2022  Location: Office  MRN: [id number]  Setting: Ambulatory outpatient  Referring Provider: Virginia Crews, MD  Type: Established Patient  Specialty: Interventional Pain Management  PCP: Virginia Crews, MD  Location: Remote location  Delivery: TeleHealth     Virtual Encounter - Pain Management PROVIDER NOTE: Information contained herein reflects review and annotations entered in association with encounter. Interpretation of such information and data should be left to medically-trained personnel. Information provided to patient can be located elsewhere in the medical record under "Patient Instructions". Document created using STT-dictation technology, any transcriptional errors that may result from process are unintentional.    Contact & Pharmacy Preferred: [phone] Home: [phone] (home) Mobile: [phone] (mobile) E-mail: [email or web address]  [place] [address] Phone: [phone] Fax: [place]. [place] [address] Phone: [phone] Fax: [phone]   Pre-screening  Ms. Boyle offered "in-person" vs "virtual" encounter. She indicated preferring virtual for this encounter.   Reason COVID-19*  Social distancing based on CDC and AMA recommendations.   I contacted [place] [facility] on 05/22/2022 via telephone.      I clearly identified myself as Gaspar Cola, MD. I verified that I was speaking with the correct person using two identifiers (Name: Carleigh Buccieri, and date of birth: 1965/12/22).  Consent I sought verbal advanced consent from [place] for virtual visit interactions. I informed Ms. Searcy of possible security and privacy concerns, risks, and limitations associated with providing  "not-in-person" medical evaluation and management services. I also informed Ms. [place] of the availability of "in-person" appointments. Finally, I informed her that there would be a charge for the virtual visit and that she could be  personally, fully or partially, financially responsible for it. Ms. Munro expressed understanding and agreed to proceed.   Historic Elements   Ms. Lucindy Veronika Heard is a 56 y.o. year old, female patient evaluated today after our last contact on 05/11/2022. Ms. Burgo  has a past medical history of Fatty liver, Fibromyalgia, GERD (gastroesophageal reflux disease), Hemorrhoids, Hot flashes, Hypertension, IBS (irritable bowel syndrome), LUQ abdominal pain (10/25/2016), Migraines, Miscarriage, Multilevel degenerative disc disease, Neuroma, and Vocal cord dysfunction. She also  has a past surgical history that includes Elbow Debridement; Foot surgery; Cholecystectomy; Colonoscopy; Abdominal hysterectomy; Esophagogastroduodenoscopy (egd) with propofol (N/A, 02/06/2018); Colonoscopy with propofol (N/A, 12/22/2019); Colonoscopy with propofol (N/A, 12/23/2019); Image guided sinus surgery (N/A, 09/28/2021); Maxillary antrostomy (Bilateral, 09/28/2021); Ethmoidectomy (N/A, 09/28/2021); and Sphenoidectomy (Bilateral, 09/28/2021). Ms. Knittel has a current medication list which includes the following prescription(s): amlodipine, bupropion, duloxetine, fluticasone, ipratropium, lansoprazole, linzess, multivitamin with minerals, ondansetron, promethazine-dextromethorphan, and triamterene-hydrochlorothiazide. She  reports that she quit smoking about 34 years ago. Her smoking use included cigarettes. She has a 3.75 pack-year smoking history. She has never used smokeless tobacco. She reports current alcohol use of about 2.0 - 4.0 standard drinks of alcohol per week. She reports that she does not use drugs. Ms. Karis is allergic to gluten meal, lactose, ranitidine, amphetamine-dextroamphetamine, lactose intolerance (gi),  reglan [metoclopramide], topiramate er, wheat bran, soap, and sulfa antibiotics.  Estimated body mass index is 32.01 kg/m as calculated from the following:   Height as of 05/14/22: _1  (1.575 m).   Weight as of 05/14/22: 175 lb (79.4 kg).  HPI  Today, she is being  contacted for a post-procedure assessment.  The patient refers having an ongoing improvement in her cervical radiculitis.  Today I have offered her to do a second cervical epidural but she has indicated that she rather after the beginning of the year.  I will go ahead and enter the order and will activated whenever she calls for it.  Post-procedure evaluation   Type: Cervical Epidural Steroid injection (ESI) (Interlaminar) #2 (#1 of 2023)  Laterality: Right  Level: C7-T1 Imaging: Fluoroscopy-assisted DOS: 05/11/2022  Performed by: Milinda Pointer, MD Anesthesia: Local anesthesia (1-2% Lidocaine) Anxiolysis: IV Versed 2.0 mg Sedation: No Sedation                         Purpose: Diagnostic/Therapeutic Indications: Cervicalgia, cervical radicular pain, degenerative disc disease, severe enough to impact quality of life or function. 1. Cervical radiculopathy at C6 (Right)   2. Cervicalgia (Bilateral) (R>L)   3. Cervical Grade 1 (2 mm) Retrolisthesis C5 over C6   4. 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 Cervical spondylitis with radiculitis (HCC) (C6) (Bilateral) (R>L)   6. Cervicogenic headache (Bilateral)   7. DDD (degenerative disc disease), cervical    NAS-11 score:   Pre-procedure: 4 /10   Post-procedure: 3 /10      Effectiveness:  Initial hour after procedure: 100 %. Subsequent 4-6 hours post-procedure: 100 %. Analgesia past initial 6 hours: 75 % (lasting 2 weeks). Ongoing improvement:  Analgesic: The candidate for stealing going on ongoing 50% improvement in the pain. Function: Ms. Bacorn reports improvement in function ROM: Ms. Will reports improvement in ROM  Pharmacotherapy Assessment    Opioid Analgesic: No chronic opioid analgesics therapy prescribed by our practice.   Monitoring: [place] PMP: PDMP not reviewed this encounter.       Pharmacotherapy: No side-effects or adverse reactions reported. Compliance: No problems identified. Effectiveness: Clinically acceptable. Plan: Refer to "POC". UDS:  Summary  Date Value Ref Range Status  01/16/2018 FINAL  Final    Comment:    ==================================================================== TOXASSURE COMP DRUG ANALYSIS,UR ==================================================================== Test                             Result       Flag       Units Drug Present and Declared for Prescription Verification   Oxazepam                       51           EXPECTED   ng/mg creat   Temazepam                      44           EXPECTED   ng/mg creat    Oxazepam and temazepam are expected metabolites of diazepam.    Oxazepam is also an expected metabolite of other benzodiazepine    drugs, including chlordiazepoxide, prazepam, clorazepate,    halazepam, and temazepam.  Oxazepam and temazepam are available    as scheduled prescription medications.   Phentermine                    PRESENT      EXPECTED   Duloxetine                     PRESENT      EXPECTED Drug  Present not Declared for Prescription Verification   Topiramate                     PRESENT      UNEXPECTED   Acetaminophen                  PRESENT      UNEXPECTED Drug Absent but Declared for Prescription Verification   Cyclobenzaprine                Not Detected UNEXPECTED   Promethazine                   Not Detected UNEXPECTED ==================================================================== Test                      Result    Flag   Units      Ref Range   Creatinine              57               mg/dL      >=20 ==================================================================== Declared Medications:  The flagging and interpretation on this report are based on  the  following declared medications.  Unexpected results may arise from  inaccuracies in the declared medications.  **Note: The testing scope of this panel includes these medications:  Cyclobenzaprine  Diazepam  Duloxetine  Phentermine  Promethazine  **Note: The testing scope of this panel does not include following  reported medications:  Acyclovir  Budesonide  Docusate  Hydrochlorothiazide (Triamterine-Hydrochlorthzide)  Levocetirizine  Linaclotide  Meclizine  Montelukast  Multivitamin  Potassium  Triamterene (Triamterine-Hydrochlorthzide)  Valacyclovir ==================================================================== For clinical consultation, please call [phone]. ====================================================================    No results found for: "CBDTHCR", "D8THCCBX", "D9THCCBX"   Laboratory Chemistry Profile   Renal Lab Results  Component Value Date   BUN 16 04/07/2022   CREATININE 1.19 (H) 04/07/2022   BCR 13 04/07/2022   GFRAA >60 07/07/2019   GFRNONAA 56 (L) 05/05/2021    Hepatic Lab Results  Component Value Date   AST 31 04/07/2022   ALT 46 (H) 04/07/2022   ALBUMIN 4.8 04/07/2022   ALKPHOS 96 04/07/2022   LIPASE 33 12/15/2016    Electrolytes Lab Results  Component Value Date   NA 139 04/07/2022   K 3.7 04/07/2022   CL 98 04/07/2022   CALCIUM 10.1 04/07/2022   MG 2.2 01/16/2018    Bone Lab Results  Component Value Date   25OHVITD1 51 01/16/2018   25OHVITD2 <1.0 01/16/2018   25OHVITD3 50 01/16/2018    Inflammation (CRP: Acute Phase) (ESR: Chronic Phase) Lab Results  Component Value Date   CRP 0.8 07/05/2021   ESRSEDRATE 17 07/05/2021         Note: Above Lab results reviewed.  Imaging  DG PAIN CLINIC C-ARM 1-60 MIN NO REPORT Fluoro was used, but no Radiologist interpretation will be provided.  Please refer to "NOTES" tab for provider progress note.  Assessment  The primary encounter diagnosis was Cervical  radiculopathy at C6 (Right). Diagnoses of Cervicalgia (Bilateral) (R>L), Cervical Grade 1 (2 mm) Retrolisthesis C5 over C6, Cervical foraminal stenosis (C3-4 & C5-6) (Bilateral), Cervical spondylitis with radiculitis (HCC) (C6) (Bilateral) (R>L), Cervicogenic headache (Bilateral), DDD (degenerative disc disease), cervical, Chronic neck pain (1ry area of Pain) (Bilateral) (midline), Chronic upper extremity pain (4th area of Pain) (Bilateral) (R>L), and Abnormal MRI, cervical spine (07/10/2018) were also pertinent to this visit.  Plan of Care  Problem-specific:  No problem-specific Assessment & Plan notes found for this encounter.  Ms. Eulala Newcombe has a current medication list which includes the following long-term medication(s): amlodipine, bupropion, duloxetine, fluticasone, ipratropium, lansoprazole, linzess, and triamterene-hydrochlorothiazide.  Pharmacotherapy (Medications Ordered): No orders of the defined types were placed in this encounter.  Orders:  Orders Placed This Encounter  Procedures   Cervical Epidural Injection    Sedation: Patient's choice. Purpose: Therapeutic Indication(s): Radiculitis and cervicalgia associater with cervical degenerative disc disease.    Standing Status:   Standing    Number of Occurrences:   1    Standing Expiration Date:   08/21/2022    Scheduling Instructions:     Procedure: Cervical Epidural Steroid Injection/Block     Level(s): C7-T1     Laterality: Right-sided     Timeframe: As soon as schedule allows    Order Specific Question:   Where will this procedure be performed?    Answer:   [facility]    Comments:   by Dr. Dossie Arbour   Follow-up plan:   Return for [facility]): (R) CESI #2 (PRN).     Interventional Therapies  Risk  Complexity Considerations:   WNL   Planned  Pending:   Therapeutic right cervical ESI #2    Under consideration:   Diagnostic/therapeutic right cervical ESI #2 of 2023    Completed:    Diagnostic/therapeutic right L3-4 LESI x1 (09/08/2021) (100/100/100/100)  Palliative bilateral cervical facet MBB x2 (06/13/2018) (100/100/0/<25)  Diagnostic bilateral lumbar facet MBB x2 (08/08/2018) [facility])  Diagnostic right Cervical ESI x2 (05/11/2022) (100/100/75/>50)  Therapeutic right cervical facet RFA x1 (07/23/2018) (100/50/0)    Completed by other providers:   Therapeutic sphenopalatine ganglion Blk by Dr. Brigitte Pulse x2 (03/04/2018) Diagnostic bilateral upper extremity EMG/PNCV by Dr. Brigitte Pulse: Right Grade II & Left Grade I CTS (08/23/2018)    Therapeutic  Palliative (PRN) options:   Therapeutic/palliative cervical facet MBB   Therapeutic/palliativelumbar facet MBB   Therapeutic/palliative Cervical ESI   Therapeutic/palliative cervical facet RFA       Recent Visits Date Type Provider Dept  05/11/22 Procedure visit Milinda Pointer, MD [facility]  04/19/22 Office Visit Milinda Pointer, MD [facility]  04/04/22 Office Visit Milinda Pointer, MD [facility]  Showing recent visits within past 90 days and meeting all other requirements Today's Visits Date Type Provider Dept  05/22/22 Office Visit Milinda Pointer, MD [facility]  Showing today's visits and meeting all other requirements Future Appointments No visits were found meeting these conditions. Showing future appointments within next 90 days and meeting all other requirements  I discussed the assessment and treatment plan with the patient. The patient was provided an opportunity to ask questions and all were answered. The patient agreed with the plan and demonstrated an understanding of the instructions.  Patient advised to call back or seek an in-person evaluation if the symptoms or condition worsens.  Duration of encounter: 12 minutes.  Note by: Gaspar Cola, MD Date: 05/22/2022; Time: 2:15 PM

## 2022-05-22 NOTE — Patient Instructions (Signed)
______________________________________________________________________  Preparing for your procedure  During your procedure appointment there will be: No Prescription Refills. No disability issues to discussed. No medication changes or discussions.  Instructions: Food intake: Avoid eating anything solid for at least 8 hours prior to your procedure. Clear liquid intake: You may take clear liquids such as water up to 2 hours prior to your procedure. (No carbonated drinks. No soda.) Transportation: Unless otherwise stated by your physician, bring a driver. Morning Medicines: Except for blood thinners, take all of your other morning medications with a sip of water. Make sure to take your heart and blood pressure medicines. If your blood pressure's lower number is above 100, the case will be rescheduled. Blood thinners: If you take a blood thinner, but were not instructed to stop it, call our office [phone] and ask to talk to a nurse. Not stopping a blood thinner prior to certain procedures could lead to serious complications. Diabetics on insulin: Notify the staff so that you can be scheduled 1st case in the morning. If your diabetes requires high dose insulin, take only  of your normal insulin dose the morning of the procedure and notify the staff that you have done so. Preventing infections: Shower with an antibacterial soap the morning of your procedure.  Build-up your immune system: Take 1000 mg of Vitamin C with every meal (3 times a day) the day prior to your procedure. Antibiotics: Inform the nursing staff if you are taking any antibiotics or if you have any conditions that may require antibiotics prior to procedures. (Example: recent joint implants)   Pregnancy: If you are pregnant make sure to notify the nursing staff. Not doing so may result in injury to the fetus, including death.  Sickness: If you have a cold, fever, or any active infections, call and cancel or reschedule your  procedure. Receiving steroids while having an infection may result in complications. Arrival: You must be in the facility at least 30 minutes prior to your scheduled procedure. Tardiness: Your scheduled time is also the cutoff time. If you do not arrive at least 15 minutes prior to your procedure, you will be rescheduled.  Children: Do not bring any children with you. Make arrangements to keep them home. Dress appropriately: There is always a possibility that your clothing may get soiled. Avoid long dresses. Valuables: Do not bring any jewelry or valuables.  Reasons to call and reschedule or cancel your procedure: (Following these recommendations will minimize the risk of a serious complication.) Surgeries: Avoid having procedures within 2 weeks of any surgery. (Avoid for 2 weeks before or after any surgery). Flu Shots: Avoid having procedures within 2 weeks of a flu shots or . (Avoid for 2 weeks before or after immunizations). Barium: Avoid having a procedure within 7-10 days after having had a radiological study involving the use of radiological contrast. (Myelograms, Barium swallow or enema study). Heart attacks: Avoid any elective procedures or surgeries for the initial 6 months after a "Myocardial Infarction" (Heart Attack). Blood thinners: It is imperative that you stop these medications before procedures. Let us know if you if you take any blood thinner.  Infection: Avoid procedures during or within two weeks of an infection (including chest colds or gastrointestinal problems). Symptoms associated with infections include: Localized redness, fever, chills, night sweats or profuse sweating, burning sensation when voiding, cough, congestion, stuffiness, runny nose, sore throat, diarrhea, nausea, vomiting, cold or Flu symptoms, recent or current infections. It is specially important if the infection is   over the area that we intend to treat. Heart and lung problems: Symptoms that may suggest an  active cardiopulmonary problem include: cough, chest pain, breathing difficulties or shortness of breath, dizziness, ankle swelling, uncontrolled high or unusually low blood pressure, and/or palpitations. If you are experiencing any of these symptoms, cancel your procedure and contact your primary care physician for an evaluation.  Remember:  Regular Business hours are:  Monday to Thursday 8:00 AM to 4:00 PM  Provider's Schedule: Wonda Goodgame, MD:  Procedure days: Tuesday and Thursday 7:30 AM to 4:00 PM  Bilal Lateef, MD:  Procedure days: Monday and Wednesday 7:30 AM to 4:00 PM  ______________________________________________________________________    ____________________________________________________________________________________________  General Risks and Possible Complications  Patient Responsibilities: It is important that you read this as it is part of your informed consent. It is our duty to inform you of the risks and possible complications associated with treatments offered to you. It is your responsibility as a patient to read this and to ask questions about anything that is not clear or that you believe was not covered in this document.  Patient's Rights: You have the right to refuse treatment. You also have the right to change your mind, even after initially having agreed to have the treatment done. However, under this last option, if you wait until the last second to change your mind, you may be charged for the materials used up to that point.  Introduction: Medicine is not an exact science. Everything in Medicine, including the lack of treatment(s), carries the potential for danger, harm, or loss (which is by definition: Risk). In Medicine, a complication is a secondary problem, condition, or disease that can aggravate an already existing one. All treatments carry the risk of possible complications. The fact that a side effects or complications occurs, does not imply  that the treatment was conducted incorrectly. It must be clearly understood that these can happen even when everything is done following the highest safety standards.  No treatment: You can choose not to proceed with the proposed treatment alternative. The "PRO(s)" would include: avoiding the risk of complications associated with the therapy. The "CON(s)" would include: not getting any of the treatment benefits. These benefits fall under one of three categories: diagnostic; therapeutic; and/or palliative. Diagnostic benefits include: getting information which can ultimately lead to improvement of the disease or symptom(s). Therapeutic benefits are those associated with the successful treatment of the disease. Finally, palliative benefits are those related to the decrease of the primary symptoms, without necessarily curing the condition (example: decreasing the pain from a flare-up of a chronic condition, such as incurable terminal cancer).  General Risks and Complications: These are associated to most interventional treatments. They can occur alone, or in combination. They fall under one of the following six (6) categories: no benefit or worsening of symptoms; bleeding; infection; nerve damage; allergic reactions; and/or death. No benefits or worsening of symptoms: In Medicine there are no guarantees, only probabilities. No healthcare provider can ever guarantee that a medical treatment will work, they can only state the probability that it may. Furthermore, there is always the possibility that the condition may worsen, either directly, or indirectly, as a consequence of the treatment. Bleeding: This is more common if the patient is taking a blood thinner, either prescription or over the counter (example: Goody Powders, Fish oil, Aspirin, Garlic, etc.), or if suffering a condition associated with impaired coagulation (example: Hemophilia, cirrhosis of the liver, low platelet counts, etc.). However, even if   you  do not have one on these, it can still happen. If you have any of these conditions, or take one of these drugs, make sure to notify your treating physician. Infection: This is more common in patients with a compromised immune system, either due to disease (example: diabetes, cancer, human immunodeficiency virus [HIV], etc.), or due to medications or treatments (example: therapies used to treat cancer and rheumatological diseases). However, even if you do not have one on these, it can still happen. If you have any of these conditions, or take one of these drugs, make sure to notify your treating physician. Nerve Damage: This is more common when the treatment is an invasive one, but it can also happen with the use of medications, such as those used in the treatment of cancer. The damage can occur to small secondary nerves, or to large primary ones, such as those in the spinal cord and brain. This damage may be temporary or permanent and it may lead to impairments that can range from temporary numbness to permanent paralysis and/or brain death. Allergic Reactions: Any time a substance or material comes in contact with our body, there is the possibility of an allergic reaction. These can range from a mild skin rash (contact dermatitis) to a severe systemic reaction (anaphylactic reaction), which can result in death. Death: In general, any medical intervention can result in death, most of the time due to an unforeseen complication. ____________________________________________________________________________________________    

## 2022-05-23 ENCOUNTER — Ambulatory Visit (HOSPITAL_BASED_OUTPATIENT_CLINIC_OR_DEPARTMENT_OTHER): Payer: 59 | Admitting: Pain Medicine

## 2022-05-23 DIAGNOSIS — Z91199 Patient's noncompliance with other medical treatment and regimen due to unspecified reason: Secondary | ICD-10-CM

## 2022-05-25 ENCOUNTER — Telehealth: Payer: 59 | Admitting: Pain Medicine

## 2022-05-29 ENCOUNTER — Other Ambulatory Visit: Payer: Self-pay | Admitting: Family Medicine

## 2022-05-30 ENCOUNTER — Other Ambulatory Visit: Payer: Self-pay

## 2022-05-30 ENCOUNTER — Other Ambulatory Visit (HOSPITAL_COMMUNITY): Payer: Self-pay

## 2022-06-01 ENCOUNTER — Other Ambulatory Visit (HOSPITAL_COMMUNITY): Payer: Self-pay

## 2022-06-01 ENCOUNTER — Other Ambulatory Visit: Payer: Self-pay

## 2022-06-01 MED ORDER — TRIAMTERENE-HCTZ 37.5-25 MG PO TABS
1.0000 | ORAL_TABLET | Freq: Every day | ORAL | 1 refills | Status: DC
  Filled 2022-06-01: qty 90, 90d supply, fill #0
  Filled 2022-08-29: qty 90, 90d supply, fill #1

## 2022-06-06 ENCOUNTER — Other Ambulatory Visit (HOSPITAL_COMMUNITY): Payer: Self-pay

## 2022-06-06 NOTE — Progress Notes (Signed)
Canceled encounter 

## 2022-06-14 ENCOUNTER — Encounter: Payer: Self-pay | Admitting: Family Medicine

## 2022-06-14 NOTE — Progress Notes (Signed)
I,Briannie Gutierrez S Hazle Ogburn,acting as a Education administrator for Lavon Paganini, MD.,have documented all relevant documentation on the behalf of Lavon Paganini, MD,as directed by  Lavon Paganini, MD while in the presence of Lavon Paganini, MD.   Established patient visit   Patient: Elizabeth Bowers   DOB: [birth year]   57 y.o. Female  MRN: [id number] Visit Date: 06/15/2022  Today's healthcare provider: Lavon Paganini, MD   Chief Complaint  Patient presents with   Female GU Problem   Subjective    HPI  Patient C/O black area in her ureteral opening x a couple of weeks ago. She denies any pain with urination. She denies blood in her urine. Went to pop a cyst and noticed it.  Never had this before, no previous instrumentation.  Medications: Outpatient Medications Prior to Visit  Medication Sig   amLODipine (NORVASC) 10 MG tablet Take 1 tablet (10 mg total) by mouth daily.   buPROPion (WELLBUTRIN XL) 300 MG 24 hr tablet Take 1 tablet (300 mg total) by mouth daily.   DULoxetine (CYMBALTA) 60 MG capsule Take 1 capsule (60 mg total) by mouth daily.   fluticasone (FLONASE) 50 MCG/ACT nasal spray Place 2 sprays into both nostrils daily.   ipratropium (ATROVENT) 0.06 % nasal spray Place 2 sprays into both nostrils 4 (four) times daily.   lansoprazole (PREVACID) 30 MG capsule Take 1 capsule (30 mg total) by mouth 2 (two) times daily before a meal.   LINZESS 290 MCG CAPS capsule Take 1 capsule (290 mcg total) by mouth daily.   Multiple Vitamin (MULTIVITAMIN WITH MINERALS) TABS tablet Take 1 tablet by mouth daily.   ondansetron (ZOFRAN-ODT) 8 MG disintegrating tablet Take 1 tablet (8 mg total) by mouth every 8 (eight) hours as needed for nausea or vomiting.   promethazine-dextromethorphan (PROMETHAZINE-DM) 6.25-15 MG/5ML syrup Take 5 mLs by mouth 4 (four) times daily as needed.   triamterene-hydrochlorothiazide (MAXZIDE-25) 37.5-25 MG tablet Take 1 tablet by mouth daily.   No facility-administered  medications prior to visit.    Review of Systems  All other systems reviewed and are negative.      Objective    BP 121/77 (BP Location: Right Arm, Patient Position: Sitting, Cuff Size: Large)   Pulse 87   Temp 97.6 F (36.4 C) (Temporal)   Resp 16   Wt 173 lb 11.2 oz (78.8 kg)   SpO2 100%   BMI 31.77 kg/m    Physical Exam Vitals reviewed.  Constitutional:      General: She is not in acute distress.    Appearance: She is well-developed.  HENT:     Head: Normocephalic and atraumatic.  Eyes:     General: No scleral icterus.    Conjunctiva/sclera: Conjunctivae normal.  Cardiovascular:     Rate and Rhythm: Normal rate and regular rhythm.  Pulmonary:     Effort: Pulmonary effort is normal. No respiratory distress.  Genitourinary:    Exam position: Lithotomy position.     Comments: Small nevus on L labia Urethral opening is normal with slight hyperpigmentation at introitus Skin:    General: Skin is warm and dry.     Findings: No rash.  Neurological:     Mental Status: She is alert and oriented to person, place, and time.  Psychiatric:        Behavior: Behavior normal.      No results found for any visits on 06/15/22.  Assessment & Plan     1. Urethra disorder Slight hyperpigmentation at opening of  urethra, but normal appearing  Reassurance given No f/u needed  No follow-ups on file.      I, Lavon Paganini, MD, have reviewed all documentation for this visit. The documentation on 06/15/22 for the exam, diagnosis, procedures, and orders are all accurate and complete.   , [name], MD, MPH [facility]

## 2022-06-15 ENCOUNTER — Encounter: Payer: Self-pay | Admitting: Family Medicine

## 2022-06-15 ENCOUNTER — Ambulatory Visit (INDEPENDENT_AMBULATORY_CARE_PROVIDER_SITE_OTHER): Payer: 59 | Admitting: Family Medicine

## 2022-06-15 VITALS — BP 121/77 | HR 87 | Temp 97.6°F | Resp 16 | Wt 173.7 lb

## 2022-06-15 DIAGNOSIS — N369 Urethral disorder, unspecified: Secondary | ICD-10-CM | POA: Diagnosis not present

## 2022-06-21 ENCOUNTER — Encounter: Payer: Self-pay | Admitting: Family Medicine

## 2022-07-01 ENCOUNTER — Other Ambulatory Visit: Payer: Self-pay | Admitting: Family Medicine

## 2022-07-01 DIAGNOSIS — K581 Irritable bowel syndrome with constipation: Secondary | ICD-10-CM

## 2022-07-03 ENCOUNTER — Other Ambulatory Visit: Payer: Self-pay

## 2022-07-03 ENCOUNTER — Other Ambulatory Visit (HOSPITAL_COMMUNITY): Payer: Self-pay

## 2022-07-03 ENCOUNTER — Encounter (HOSPITAL_COMMUNITY): Payer: Self-pay

## 2022-07-03 MED ORDER — LINZESS 290 MCG PO CAPS
290.0000 ug | ORAL_CAPSULE | Freq: Every day | ORAL | 1 refills | Status: DC
  Filled 2022-07-03 (×2): qty 90, 90d supply, fill #0
  Filled 2022-08-01 – 2022-10-02 (×2): qty 90, 90d supply, fill #1

## 2022-07-03 NOTE — Telephone Encounter (Signed)
Requested Prescriptions  Pending Prescriptions Disp Refills   LINZESS 290 MCG CAPS capsule 90 capsule 1    Sig: Take 1 capsule (290 mcg total) by mouth daily.     Gastroenterology: Irritable Bowel Syndrome Passed - 07/01/2022  7:42 PM      Passed - Valid encounter within last 12 months    Recent Outpatient Visits           2 weeks ago Urethra disorder   [place], [name], MD   2 months ago Benign essential hypertension   [place], [name], MD   9 months ago Encounter for annual physical exam   [facility] [place], [name], MD   9 months ago Benign essential hypertension   [place], [name], MD   1 year ago Acute cough   [place], [name], MD       Future Appointments             In 3 months Bacigalupo, [name], MD [facility] [place]

## 2022-07-04 ENCOUNTER — Other Ambulatory Visit: Payer: Self-pay

## 2022-07-04 ENCOUNTER — Other Ambulatory Visit (HOSPITAL_COMMUNITY): Payer: Self-pay

## 2022-07-04 ENCOUNTER — Ambulatory Visit
Admission: RE | Admit: 2022-07-04 | Discharge: 2022-07-04 | Disposition: A | Discharge status: Home / Self Care | Payer: 59 | Source: Ambulatory Visit | Attending: Emergency Medicine | Admitting: Emergency Medicine

## 2022-07-04 VITALS — BP 136/83 | HR 83 | Temp 98.7°F | Resp 18

## 2022-07-04 DIAGNOSIS — J0100 Acute maxillary sinusitis, unspecified: Secondary | ICD-10-CM

## 2022-07-04 MED ORDER — AMOXICILLIN-POT CLAVULANATE 875-125 MG PO TABS
1.0000 | ORAL_TABLET | Freq: Two times a day (BID) | ORAL | 0 refills | Status: AC
  Filled 2022-07-04: qty 20, 10d supply, fill #0

## 2022-07-04 NOTE — Discharge Instructions (Addendum)
Today you are being treated for a sinus infection  Take Augmentin every morning and every evening for 10 days, ideally you see improvement in about 48 hours and steady progression from the  You can take Tylenol and/or Ibuprofen as needed for fever reduction and pain relief.   For cough: honey 1/2 to 1 teaspoon (you can dilute the honey in water or another fluid).  You can also use guaifenesin and dextromethorphan for cough. You can use a humidifier for chest congestion and cough.  If you don't have a humidifier, you can sit in the bathroom with the hot shower running.      For sore throat: try warm salt water gargles, cepacol lozenges, throat spray, warm tea or water with lemon/honey, popsicles or ice, or OTC cold relief medicine for throat discomfort.   For congestion: take a daily anti-histamine like Zyrtec, Claritin, and a oral decongestant, such as pseudoephedrine.  You can also use Flonase 1-2 sprays in each nostril daily.   It is important to stay hydrated: drink plenty of fluids (water, gatorade/powerade/pedialyte, juices, or teas) to keep your throat moisturized and help further relieve irritation/discomfort.

## 2022-07-04 NOTE — ED Triage Notes (Signed)
Pt reports she has a sinus infection. Blows out "walnut sized amount of mucus that had blood in it", headache, fatigue and face pain x 2 weeks

## 2022-07-04 NOTE — ED Provider Notes (Signed)
[facility]    CSN: [id number] Arrival date & time: 07/04/22  1146      History   Chief Complaint Chief Complaint  Patient presents with   Facial Pain    Possible sinus infection - Entered by patient    HPI Elizabeth Bowers is a 57 y.o. female.   Patient presents for evaluation of nasal congestion, rhinorrhea, sinus pain and pressure, postnasal drip, sore throat, cough, nausea and headaches present for 2 weeks.  Associates nausea with drainage, denies vomiting and has been able to tolerate food and liquids.  Has seen 1 occurrence of blood-tinged sputum noticed in the nasal mucus.  Has attempted use of saline irrigation, Atrovent, Flonase with no relief.  No known sick contacts.  Denies fevers.  History of recurrent sinusitis followed by ear nose and T and has completed several sinus surgical interventions, endorses that has been told by ENT that she does not have frontal sinuses confirmed on CT.    Past Medical History:  Diagnosis Date   Fatty liver    Fibromyalgia    GERD (gastroesophageal reflux disease)    Hemorrhoids    Hot flashes    Hypertension    IBS (irritable bowel syndrome)    LUQ abdominal pain 10/25/2016   Migraines    Miscarriage    x 2; 4 live births    Multilevel degenerative disc disease    Neuroma    Vocal cord dysfunction     Patient Active Problem List   Diagnosis Date Noted   Cervical radiculopathy at C6 (Right) 04/04/2022   Chronic hip pain (Right) 08/16/2021   Impaired range of motion of hip (Right) 08/16/2021   Chronic lower extremity pain (Right) 08/16/2021   Lower extremity radicular pain (Right) 08/16/2021   Lumbar radiculitis (L3/L4) (Right) 08/16/2021   Morton's neuroma of foot (Left) 11/15/2020   MDD (major depressive disorder) 10/01/2020   Anterior tibialis tendinitis, right 04/28/2020   Insomnia 07/07/2019   Benign essential hypertension 04/22/2019   Radial styloid tenosynovitis of left hand 01/13/2019   Pain in right  hand 01/13/2019   Bilateral carpal tunnel syndrome 01/13/2019   Chronic pain of left wrist 01/13/2019   Chronic low back pain (Bilateral) w/o sciatica 08/08/2018   Abnormal MRI, cervical spine (07/10/2018) 07/17/2018   Cervical spondylitis with radiculitis (HCC) (C6) (Bilateral) (R>L) 06/26/2018   Chronic sacroiliac joint pain (Bilateral) (R>L) 06/26/2018   Cervical Grade 1 (2 mm) Retrolisthesis C5 over C6 03/20/2018   Cervical foraminal stenosis (C3-4 & C5-6) (Bilateral) 03/20/2018   DDD (degenerative disc disease), cervical 03/20/2018   Stage 3 chronic kidney disease ([place]) 03/20/2018   Acid reflux disease 03/20/2018   Spondylosis without myelopathy or radiculopathy, cervical region 03/20/2018   Chronic hip pain (Bilateral) (L>R) 03/20/2018   Lumbar facet syndrome (Bilateral) (R>L) 03/20/2018   Spondylosis without myelopathy or radiculopathy, lumbar region 03/20/2018   Chronic Sacroiliac joint dysfunction (Bilateral) (R>L) 03/20/2018   Chronic Somatic dysfunction of sacroiliac joint (Bilateral) (R>L) 03/20/2018   Osteoarthritis of hip (Bilateral) 03/20/2018   Cervicogenic headache (Bilateral) 03/20/2018   Cervicalgia (Bilateral) (R>L) 03/20/2018   Cervical facet syndrome (Bilateral) (R>L) 03/20/2018   Chronic occipital neuralgia (Bilateral) 03/20/2018   Chronic upper extremity pain (4th area of Pain) (Bilateral) (R>L) 03/19/2018   Ganglion cyst 02/20/2018   Chronic low back pain (2ry area of Pain) (Bilateral) (L>R) w/ sciatica (Bilateral) 01/16/2018   Chronic lower extremity pain (3ry area of Pain) (Bilateral) (L>R) 01/16/2018   Chronic neck pain (  1ry area of Pain) (Bilateral) (midline) 01/16/2018   Chronic foot pain (Left) 01/16/2018   Chronic pain syndrome 01/16/2018   Pharmacologic therapy 01/16/2018   Disorder of skeletal system 01/16/2018   Problems influencing health status 01/16/2018   Class 1 obesity with serious comorbidity and body mass index (BMI) of 32.0 to 32.9 in adult  11/21/2017   Anxiety 09/28/2017   Chronic knee pain (Right) 09/28/2017   Fatty liver 07/09/2017   Colon polyps 07/09/2017   Fibromyalgia 07/09/2017   Sinusitis, chronic 07/09/2017   Allergic rhinitis 07/09/2017   IBS (irritable bowel syndrome) 07/09/2017   Eyelid dermatitis, allergic/contact 07/09/2017   Chronic sacroiliac joint pain (Right) 12/16/2016   DDD (degenerative disc disease), lumbar 07/18/2016   Family hx of colon cancer 07/18/2016    Past Surgical History:  Procedure Laterality Date   ABDOMINAL HYSTERECTOMY     2012 removed cervix h/o abnormal pap    CHOLECYSTECTOMY     2000   COLONOSCOPY     2018 with + polpys and GIB 2/2 polyp removal    COLONOSCOPY WITH PROPOFOL N/A 12/22/2019   Procedure: COLONOSCOPY WITH PROPOFOL;  Surgeon: Lin Landsman, MD;  Location: [facility] ENDOSCOPY;  Service: Gastroenterology;  Laterality: N/A;   COLONOSCOPY WITH PROPOFOL N/A 12/23/2019   Procedure: COLONOSCOPY WITH PROPOFOL;  Surgeon: Lin Landsman, MD;  Location: [facility] ENDOSCOPY;  Service: Gastroenterology;  Laterality: N/A;  Last name pronounced LEE-MA   ELBOW DEBRIDEMENT     ESOPHAGOGASTRODUODENOSCOPY (EGD) WITH PROPOFOL N/A 02/06/2018   Procedure: ESOPHAGOGASTRODUODENOSCOPY (EGD) WITH PROPOFOL with biopsies;  Surgeon: Lin Landsman, MD;  Location: [place];  Service: Endoscopy;  Laterality: N/A;   ETHMOIDECTOMY N/A 09/28/2021   Procedure: TOTAL ETHMOIDECTOMY;  Surgeon: Carloyn Manner, MD;  Location: [place];  Service: ENT;  Laterality: N/A;   FOOT SURGERY     IMAGE GUIDED SINUS SURGERY N/A 09/28/2021   Procedure: IMAGE GUIDED SINUS SURGERY;  Surgeon: Carloyn Manner, MD;  Location: [place];  Service: ENT;  Laterality: N/A;  PLACED DISK ON OR CHARGE NURSE DESK 4-21  KP   MAXILLARY ANTROSTOMY Bilateral 09/28/2021   Procedure: MAXILLARY ANTROSTOMY WITHOUT TISSUE REMOVAL;  Surgeon: Carloyn Manner, MD;  Location: [place];   Service: ENT;  Laterality: Bilateral;   SPHENOIDECTOMY Bilateral 09/28/2021   Procedure: SPHENOIDOTOMY;  Surgeon: Carloyn Manner, MD;  Location: [place];  Service: ENT;  Laterality: Bilateral;    OB History   No obstetric history on file.      Home Medications    Prior to Admission medications   Medication Sig Start Date End Date Taking? Authorizing Provider  amLODipine (NORVASC) 10 MG tablet Take 1 tablet (10 mg total) by mouth daily. 04/10/22   Virginia Crews, MD  buPROPion (WELLBUTRIN XL) 300 MG 24 hr tablet Take 1 tablet (300 mg total) by mouth daily. 04/20/22   Virginia Crews, MD  DULoxetine (CYMBALTA) 60 MG capsule Take 1 capsule (60 mg total) by mouth daily. 05/08/22   Virginia Crews, MD  fluticasone (FLONASE) 50 MCG/ACT nasal spray Place 2 sprays into both nostrils daily. 06/22/21   Hughie Closs, PA-C  ipratropium (ATROVENT) 0.06 % nasal spray Place 2 sprays into both nostrils 4 (four) times daily. 04/10/22   Virginia Crews, MD  lansoprazole (PREVACID) 30 MG capsule Take 1 capsule (30 mg total) by mouth 2 (two) times daily before a meal. 05/08/22   Gwyneth Sprout, FNP  LINZESS 290 MCG CAPS capsule  Take 1 capsule (290 mcg total) by mouth daily. 07/03/22   Virginia Crews, MD  Multiple Vitamin (MULTIVITAMIN WITH MINERALS) TABS tablet Take 1 tablet by mouth daily.    [provider]  ondansetron (ZOFRAN-ODT) 8 MG disintegrating tablet Take 1 tablet (8 mg total) by mouth every 8 (eight) hours as needed for nausea or vomiting. 02/20/22   Margarette Canada, NP  promethazine-dextromethorphan (PROMETHAZINE-DM) 6.25-15 MG/5ML syrup Take 5 mLs by mouth 4 (four) times daily as needed. 02/20/22   Margarette Canada, NP  triamterene-hydrochlorothiazide (MAXZIDE-25) 37.5-25 MG tablet Take 1 tablet by mouth daily. 06/01/22   Bacigalupo, [name], MD    Family History Family History  Problem Relation Age of Onset   Hypertension Mother    Hyperlipidemia  Mother    Glaucoma Mother    Diabetes Mother        type 2    COPD Father        emphysema   Alcohol abuse Father    Colon cancer Sister    Cancer Sister        rectal/colon cancer no h/o IBD   Colon polyps Sister    Asthma Sister    COPD Sister    Kidney disease Sister    Mental illness Sister        bipolar    Miscarriages / [place] Sister    Uterine cancer Sister    Alcohol abuse Son    Depression Son    Diabetes Son    Drug abuse Son    Mental illness Son        bipolar    Diabetes Maternal Grandmother    COPD Paternal Grandmother    Cancer Paternal Grandfather        FH colon cancer maternal aunts/uncles    Miscarriages / Stillbirths Sister    Arthritis Son    Arthritis Son    Breast cancer Neg Hx     Social History Social History   Tobacco Use   Smoking status: Former    Packs/day: 0.75    Years: 5.00    Total pack years: 3.75    Types: Cigarettes    Quit date: 07/06/1987    Years since quitting: 35.0   Smokeless tobacco: Never  Vaping Use   Vaping Use: Never used  Substance Use Topics   Alcohol use: Yes    Alcohol/week: 2.0 - 4.0 standard drinks of alcohol    Types: 2 - 4 Cans of beer per week    Comment: wine cooler   Drug use: Never     Allergies   Gluten meal, Lactose, Ranitidine, Amphetamine-dextroamphetamine, Lactose intolerance (gi), Reglan [metoclopramide], Topiramate er, Wheat bran, Soap, and Sulfa antibiotics   Review of Systems Review of Systems  Constitutional: Negative.   HENT:  Positive for congestion, postnasal drip, rhinorrhea, sinus pressure, sinus pain and sore throat. Negative for dental problem, drooling, ear discharge, ear pain, facial swelling, hearing loss, mouth sores, nosebleeds, sneezing, tinnitus, trouble swallowing and voice change.   Respiratory:  Positive for cough. Negative for apnea, choking, chest tightness, shortness of breath, wheezing and stridor.   Cardiovascular: Negative.   Gastrointestinal:  Positive  for nausea. Negative for abdominal distention, abdominal pain, anal bleeding, blood in stool, constipation, diarrhea, rectal pain and vomiting.  Skin: Negative.   Neurological:  Positive for headaches. Negative for dizziness, tremors, seizures, syncope, facial asymmetry, speech difficulty, weakness, light-headedness and numbness.     Physical Exam Triage Vital Signs ED Triage Vitals  Enc Vitals Group     BP 07/04/22 1214 136/83     Pulse Rate 07/04/22 1214 83     Resp 07/04/22 1214 18     Temp 07/04/22 1214 98.7 F (37.1 C)     Temp Source 07/04/22 1214 Oral     SpO2 07/04/22 1214 96 %     Weight --      Height --      Head Circumference --      Peak Flow --      Pain Score 07/04/22 1218 6     Pain Loc --      Pain Edu? --      Excl. in [place]? --    No data found.  Updated Vital Signs BP 136/83 (BP Location: Right Arm)   Pulse 83   Temp 98.7 F (37.1 C) (Oral)   Resp 18   SpO2 96%   Visual Acuity Right Eye Distance:   Left Eye Distance:   Bilateral Distance:    Right Eye Near:   Left Eye Near:    Bilateral Near:     Physical Exam Constitutional:      Appearance: Normal appearance.  HENT:     Right Ear: Hearing, tympanic membrane, ear canal and external ear normal.     Left Ear: Hearing, tympanic membrane, ear canal and external ear normal.     Nose: Congestion and rhinorrhea present.     Right Sinus: Maxillary sinus tenderness present.     Left Sinus: Maxillary sinus tenderness present.     Mouth/Throat:     Pharynx: Oropharynx is clear.     Tonsils: No tonsillar exudate. 0 on the right. 0 on the left.  Eyes:     Extraocular Movements: Extraocular movements intact.  Cardiovascular:     Rate and Rhythm: Normal rate and regular rhythm.     Pulses: Normal pulses.     Heart sounds: Normal heart sounds.  Pulmonary:     Effort: Pulmonary effort is normal.     Breath sounds: Normal breath sounds.  Neurological:     Mental Status: She is alert and oriented to  person, place, and time.  Psychiatric:        Mood and Affect: Mood normal.        Behavior: Behavior normal.      UC Treatments / Results  Labs (all labs ordered are listed, but only abnormal results are displayed) Labs Reviewed - No data to display  EKG   Radiology No results found.  Procedures Procedures (including critical care time)  Medications Ordered in UC Medications - No data to display  Initial Impression / Assessment and Plan / UC Course  I have reviewed the triage vital signs and the nursing notes.  Pertinent labs & imaging results that were available during my care of the patient were reviewed by me and considered in my medical decision making (see chart for details).  Acute nonrecurrent maxillary sinusitis  Presentation and symptomology is consistent with above diagnoses, discussed with patient, symptoms have been present for 2 weeks ,we will provide bacterial coverage, Augmentin prescribed and recommended continued supportive measurements, may follow up at the urgent care or with ent  as needed  Final Clinical Impressions(s) / UC Diagnoses   Final diagnoses:  None   Discharge Instructions   None    ED Prescriptions   None    PDMP not reviewed this encounter.   Hans Eden, NP 07/04/22 1355

## 2022-07-21 ENCOUNTER — Other Ambulatory Visit: Payer: Self-pay | Admitting: Family Medicine

## 2022-07-24 ENCOUNTER — Other Ambulatory Visit: Payer: Self-pay

## 2022-07-24 ENCOUNTER — Other Ambulatory Visit (HOSPITAL_COMMUNITY): Payer: Self-pay

## 2022-07-24 MED ORDER — BUPROPION HCL ER (XL) 300 MG PO TB24
300.0000 mg | ORAL_TABLET | Freq: Every day | ORAL | 1 refills | Status: DC
  Filled 2022-07-24: qty 90, 90d supply, fill #0
  Filled 2022-08-01 – 2022-10-19 (×2): qty 90, 90d supply, fill #1

## 2022-07-24 NOTE — Telephone Encounter (Signed)
Requested Prescriptions  Pending Prescriptions Disp Refills   buPROPion (WELLBUTRIN XL) 300 MG 24 hr tablet 90 tablet 1    Sig: Take 1 tablet (300 mg total) by mouth daily.     Psychiatry: Antidepressants - bupropion Failed - 07/21/2022  1:08 PM      Failed - Cr in normal range and within 360 days    Creatinine, Ser  Date Value Ref Range Status  04/07/2022 1.19 (H) 0.57 - 1.00 mg/dL Final         Failed - ALT in normal range and within 360 days    ALT  Date Value Ref Range Status  04/07/2022 46 (H) 0 - 32 IU/L Final         Passed - AST in normal range and within 360 days    AST  Date Value Ref Range Status  04/07/2022 31 0 - 40 IU/L Final         Passed - Completed PHQ-2 or PHQ-9 in the last 360 days      Passed - Last BP in normal range    BP Readings from Last 1 Encounters:  07/04/22 136/83         Passed - Valid encounter within last 6 months    Recent Outpatient Visits           1 month ago Urethra disorder   [place], [name], MD   3 months ago Benign essential hypertension   [place], [name], MD   9 months ago Encounter for annual physical exam   [place], [name], MD   10 months ago Benign essential hypertension   [place], [name], MD   1 year ago Acute cough   [place], [name], MD       Future Appointments             In 2 months Bacigalupo, [name], MD [facility]

## 2022-07-25 DIAGNOSIS — S60222A Contusion of left hand, initial encounter: Secondary | ICD-10-CM | POA: Diagnosis not present

## 2022-07-27 ENCOUNTER — Ambulatory Visit
Admission: EM | Admit: 2022-07-27 | Discharge: 2022-07-27 | Disposition: A | Discharge status: Home / Self Care | Payer: 59 | Attending: Physician Assistant | Admitting: Physician Assistant

## 2022-07-27 ENCOUNTER — Ambulatory Visit (INDEPENDENT_AMBULATORY_CARE_PROVIDER_SITE_OTHER): Payer: 59

## 2022-07-27 ENCOUNTER — Encounter: Payer: Self-pay | Admitting: Emergency Medicine

## 2022-07-27 DIAGNOSIS — M7989 Other specified soft tissue disorders: Secondary | ICD-10-CM | POA: Diagnosis not present

## 2022-07-27 DIAGNOSIS — M79642 Pain in left hand: Secondary | ICD-10-CM

## 2022-07-27 MED ORDER — PREDNISONE 10 MG PO TABS: ORAL_TABLET | ORAL | 0 refills | Status: DC

## 2022-07-27 MED ORDER — HYDROCODONE-ACETAMINOPHEN 5-325 MG PO TABS: 2.0000 | ORAL_TABLET | Freq: Three times a day (TID) | ORAL | 0 refills | Status: AC | PRN

## 2022-07-27 NOTE — Discharge Instructions (Signed)
-  X-ray normal. - Take the prednisone as prescribed. - I sent Norco as needed for severe pain.  You can also purchase over-the-counter Voltaren gel apply topically.  You have a condition requiring you to follow up with Orthopedics so please call one of the following office for appointment:   [facility] [address] [place], [place] [address] Phone: [phone]  [facility] [address], [place], [place] [address] Phone: [phone]

## 2022-07-27 NOTE — ED Triage Notes (Signed)
Pt hit her left hand 1 week ago and it continues to hurt.

## 2022-07-27 NOTE — ED Provider Notes (Signed)
[facility]    CSN: [id number] Arrival date & time: 07/27/22  1557      History   Chief Complaint Chief Complaint  Patient presents with   Hand Pain    HPI Elizabeth Bowers is a 57 y.o. female presenting for pain of the second third and fourth metacarpals of the left hand.  Patient says that she hit it on something last week but she is not sure what.  She reports swelling.  She says she tried Tylenol without relief.  States she cannot take NSAIDs due to kidney problems.  She reports increased pain when trying to make a fist.  States she has a history of carpal tunnel bilaterally.  Occasional numbness and tingling of fingers but this is consistent with carpal tunnel.  Reports she went to [facility] yesterday and they x-rayed her wrist.  She says the problem is not her wrist and it is her hand.  She was prescribed Medrol but says she cannot take it because it has lactose in it and she is lactose intolerant.  She requests something to help with pain.  HPI  Past Medical History:  Diagnosis Date   Fatty liver    Fibromyalgia    GERD (gastroesophageal reflux disease)    Hemorrhoids    Hot flashes    Hypertension    IBS (irritable bowel syndrome)    LUQ abdominal pain 10/25/2016   Migraines    Miscarriage    x 2; 4 live births    Multilevel degenerative disc disease    Neuroma    Vocal cord dysfunction     Patient Active Problem List   Diagnosis Date Noted   Cervical radiculopathy at C6 (Right) 04/04/2022   Chronic hip pain (Right) 08/16/2021   Impaired range of motion of hip (Right) 08/16/2021   Chronic lower extremity pain (Right) 08/16/2021   Lower extremity radicular pain (Right) 08/16/2021   Lumbar radiculitis (L3/L4) (Right) 08/16/2021   Morton's neuroma of foot (Left) 11/15/2020   MDD (major depressive disorder) 10/01/2020   Anterior tibialis tendinitis, right 04/28/2020   Insomnia 07/07/2019   Benign essential hypertension 04/22/2019   Radial styloid  tenosynovitis of left hand 01/13/2019   Pain in right hand 01/13/2019   Bilateral carpal tunnel syndrome 01/13/2019   Chronic pain of left wrist 01/13/2019   Chronic low back pain (Bilateral) w/o sciatica 08/08/2018   Abnormal MRI, cervical spine (07/10/2018) 07/17/2018   Cervical spondylitis with radiculitis (HCC) (C6) (Bilateral) (R>L) 06/26/2018   Chronic sacroiliac joint pain (Bilateral) (R>L) 06/26/2018   Cervical Grade 1 (2 mm) Retrolisthesis C5 over C6 03/20/2018   Cervical foraminal stenosis (C3-4 & C5-6) (Bilateral) 03/20/2018   DDD (degenerative disc disease), cervical 03/20/2018   Stage 3 chronic kidney disease ([place]) 03/20/2018   Acid reflux disease 03/20/2018   Spondylosis without myelopathy or radiculopathy, cervical region 03/20/2018   Chronic hip pain (Bilateral) (L>R) 03/20/2018   Lumbar facet syndrome (Bilateral) (R>L) 03/20/2018   Spondylosis without myelopathy or radiculopathy, lumbar region 03/20/2018   Chronic Sacroiliac joint dysfunction (Bilateral) (R>L) 03/20/2018   Chronic Somatic dysfunction of sacroiliac joint (Bilateral) (R>L) 03/20/2018   Osteoarthritis of hip (Bilateral) 03/20/2018   Cervicogenic headache (Bilateral) 03/20/2018   Cervicalgia (Bilateral) (R>L) 03/20/2018   Cervical facet syndrome (Bilateral) (R>L) 03/20/2018   Chronic occipital neuralgia (Bilateral) 03/20/2018   Chronic upper extremity pain (4th area of Pain) (Bilateral) (R>L) 03/19/2018   Ganglion cyst 02/20/2018   Chronic low back pain (2ry area of Pain) (  Bilateral) (L>R) w/ sciatica (Bilateral) 01/16/2018   Chronic lower extremity pain (3ry area of Pain) (Bilateral) (L>R) 01/16/2018   Chronic neck pain (1ry area of Pain) (Bilateral) (midline) 01/16/2018   Chronic foot pain (Left) 01/16/2018   Chronic pain syndrome 01/16/2018   Pharmacologic therapy 01/16/2018   Disorder of skeletal system 01/16/2018   Problems influencing health status 01/16/2018   Class 1 obesity with serious  comorbidity and body mass index (BMI) of 32.0 to 32.9 in adult 11/21/2017   Anxiety 09/28/2017   Chronic knee pain (Right) 09/28/2017   Fatty liver 07/09/2017   Colon polyps 07/09/2017   Fibromyalgia 07/09/2017   Sinusitis, chronic 07/09/2017   Allergic rhinitis 07/09/2017   IBS (irritable bowel syndrome) 07/09/2017   Eyelid dermatitis, allergic/contact 07/09/2017   Chronic sacroiliac joint pain (Right) 12/16/2016   DDD (degenerative disc disease), lumbar 07/18/2016   Family hx of colon cancer 07/18/2016    Past Surgical History:  Procedure Laterality Date   ABDOMINAL HYSTERECTOMY     2012 removed cervix h/o abnormal pap    CHOLECYSTECTOMY     2000   COLONOSCOPY     2018 with + polpys and GIB 2/2 polyp removal    COLONOSCOPY WITH PROPOFOL N/A 12/22/2019   Procedure: COLONOSCOPY WITH PROPOFOL;  Surgeon: Lin Landsman, MD;  Location: [facility] ENDOSCOPY;  Service: Gastroenterology;  Laterality: N/A;   COLONOSCOPY WITH PROPOFOL N/A 12/23/2019   Procedure: COLONOSCOPY WITH PROPOFOL;  Surgeon: Lin Landsman, MD;  Location: [facility] ENDOSCOPY;  Service: Gastroenterology;  Laterality: N/A;  Last name pronounced LEE-MA   ELBOW DEBRIDEMENT     ESOPHAGOGASTRODUODENOSCOPY (EGD) WITH PROPOFOL N/A 02/06/2018   Procedure: ESOPHAGOGASTRODUODENOSCOPY (EGD) WITH PROPOFOL with biopsies;  Surgeon: Lin Landsman, MD;  Location: [place];  Service: Endoscopy;  Laterality: N/A;   ETHMOIDECTOMY N/A 09/28/2021   Procedure: TOTAL ETHMOIDECTOMY;  Surgeon: Carloyn Manner, MD;  Location: [place];  Service: ENT;  Laterality: N/A;   FOOT SURGERY     IMAGE GUIDED SINUS SURGERY N/A 09/28/2021   Procedure: IMAGE GUIDED SINUS SURGERY;  Surgeon: Carloyn Manner, MD;  Location: [place];  Service: ENT;  Laterality: N/A;  PLACED DISK ON OR CHARGE NURSE DESK 4-21  KP   MAXILLARY ANTROSTOMY Bilateral 09/28/2021   Procedure: MAXILLARY ANTROSTOMY WITHOUT TISSUE REMOVAL;  Surgeon:  Carloyn Manner, MD;  Location: [place];  Service: ENT;  Laterality: Bilateral;   SPHENOIDECTOMY Bilateral 09/28/2021   Procedure: SPHENOIDOTOMY;  Surgeon: Carloyn Manner, MD;  Location: [place];  Service: ENT;  Laterality: Bilateral;    OB History   No obstetric history on file.      Home Medications    Prior to Admission medications   Medication Sig Start Date End Date Taking? Authorizing Provider  HYDROcodone-acetaminophen (NORCO/VICODIN) 5-325 MG tablet Take 2 tablets by mouth every 8 (eight) hours as needed for up to 2 days for severe pain. 07/27/22 07/29/22 Yes Danton Clap, PA-C  predniSONE (DELTASONE) 10 MG tablet Take 6 tabs p.o. on day 1 and decrease by 1 tablet daily until complete 07/27/22  Yes Laurene Footman B, PA-C  amLODipine (NORVASC) 10 MG tablet Take 1 tablet (10 mg total) by mouth daily. 04/10/22   Virginia Crews, MD  buPROPion (WELLBUTRIN XL) 300 MG 24 hr tablet Take 1 tablet (300 mg total) by mouth daily. 07/24/22   Virginia Crews, MD  DULoxetine (CYMBALTA) 60 MG capsule Take 1 capsule (60 mg total) by mouth daily. 05/08/22  Virginia Crews, MD  fluticasone [facility]) 50 MCG/ACT nasal spray Place 2 sprays into both nostrils daily. 06/22/21   Hughie Closs, PA-C  ipratropium (ATROVENT) 0.06 % nasal spray Place 2 sprays into both nostrils 4 (four) times daily. 04/10/22   Virginia Crews, MD  lansoprazole (PREVACID) 30 MG capsule Take 1 capsule (30 mg total) by mouth 2 (two) times daily before a meal. 05/08/22   Gwyneth Sprout, FNP  LINZESS 290 MCG CAPS capsule Take 1 capsule (290 mcg total) by mouth daily. 07/03/22   Virginia Crews, MD  Multiple Vitamin (MULTIVITAMIN WITH MINERALS) TABS tablet Take 1 tablet by mouth daily.    [provider]  ondansetron (ZOFRAN-ODT) 8 MG disintegrating tablet Take 1 tablet (8 mg total) by mouth every 8 (eight) hours as needed for nausea or vomiting. 02/20/22   Margarette Canada, NP   triamterene-hydrochlorothiazide (MAXZIDE-25) 37.5-25 MG tablet Take 1 tablet by mouth daily. 06/01/22   Bacigalupo, [name], MD    Family History Family History  Problem Relation Age of Onset   Hypertension Mother    Hyperlipidemia Mother    Glaucoma Mother    Diabetes Mother        type 2    COPD Father        emphysema   Alcohol abuse Father    Colon cancer Sister    Cancer Sister        rectal/colon cancer no h/o IBD   Colon polyps Sister    Asthma Sister    COPD Sister    Kidney disease Sister    Mental illness Sister        bipolar    Miscarriages / [place] Sister    Uterine cancer Sister    Alcohol abuse Son    Depression Son    Diabetes Son    Drug abuse Son    Mental illness Son        bipolar    Diabetes Maternal Grandmother    COPD Paternal Grandmother    Cancer Paternal Grandfather        FH colon cancer maternal aunts/uncles    Miscarriages / Stillbirths Sister    Arthritis Son    Arthritis Son    Breast cancer Neg Hx     Social History Social History   Tobacco Use   Smoking status: Former    Packs/day: 0.75    Years: 5.00    Total pack years: 3.75    Types: Cigarettes    Quit date: 07/06/1987    Years since quitting: 35.0   Smokeless tobacco: Never  Vaping Use   Vaping Use: Never used  Substance Use Topics   Alcohol use: Yes    Alcohol/week: 2.0 - 4.0 standard drinks of alcohol    Types: 2 - 4 Cans of beer per week    Comment: wine cooler   Drug use: Never     Allergies   Gluten meal, Lactose, Ranitidine, Amphetamine-dextroamphetamine, Lactose intolerance (gi), Reglan [metoclopramide], Topiramate er, Wheat bran, Soap, and Sulfa antibiotics   Review of Systems Review of Systems  Musculoskeletal:  Positive for arthralgias and joint swelling.  Skin:  Positive for color change. Negative for wound.  Neurological:  Positive for numbness. Negative for weakness.     Physical Exam Triage Vital Signs ED Triage Vitals  Enc Vitals  Group     BP 07/27/22 1646 137/84     Pulse Rate 07/27/22 1646 85     Resp 07/27/22  1646 16     Temp 07/27/22 1646 98.8 F (37.1 C)     Temp Source 07/27/22 1646 Oral     SpO2 07/27/22 1646 96 %     Weight --      Height --      Head Circumference --      Peak Flow --      Pain Score 07/27/22 1643 8     Pain Loc --      Pain Edu? --      Excl. in [place]? --    No data found.  Updated Vital Signs BP 137/84 (BP Location: Right Arm)   Pulse 85   Temp 98.8 F (37.1 C) (Oral)   Resp 16   SpO2 96%   Physical Exam Vitals and nursing note reviewed.  Constitutional:      General: She is not in acute distress.    Appearance: Normal appearance. She is not ill-appearing or toxic-appearing.  HENT:     Head: Normocephalic and atraumatic.  Eyes:     General: No scleral icterus.       Right eye: No discharge.        Left eye: No discharge.     Conjunctiva/sclera: Conjunctivae normal.  Cardiovascular:     Rate and Rhythm: Normal rate and regular rhythm.     Pulses: Normal pulses.  Pulmonary:     Effort: Pulmonary effort is normal. No respiratory distress.  Musculoskeletal:     Left hand: Swelling (mild swelling and erythema of MCPs 2-4) and tenderness (TTP 2nd, 3rd and 4th MCPs) present. Decreased range of motion (unable to make a fist due to pain). Normal pulse.     Cervical back: Neck supple.  Skin:    General: Skin is dry.  Neurological:     General: No focal deficit present.     Mental Status: She is alert. Mental status is at baseline.     Motor: No weakness.     Gait: Gait normal.  Psychiatric:        Mood and Affect: Mood normal.        Behavior: Behavior normal.        Thought Content: Thought content normal.      UC Treatments / Results  Labs (all labs ordered are listed, but only abnormal results are displayed) Labs Reviewed - No data to display  EKG   Radiology DG Hand Complete Left  Result Date: 07/27/2022 CLINICAL DATA:  Hit hand 1 week ago. Pain,  swelling 3rd and 4th fingers EXAM: LEFT HAND - COMPLETE 3+ VIEW COMPARISON:  None Available. FINDINGS: There is no evidence of fracture or dislocation. There is no evidence of arthropathy or other focal bone abnormality. Soft tissues are unremarkable. IMPRESSION: Negative. Electronically Signed   By: Rolm Baptise M.D.   On: 07/27/2022 17:11    Procedures Procedures (including critical care time)  Medications Ordered in UC Medications - No data to display  Initial Impression / Assessment and Plan / UC Course  I have reviewed the triage vital signs and the nursing notes.  Pertinent labs & imaging results that were available during my care of the patient were reviewed by me and considered in my medical decision making (see chart for details).   57 year old female with history of carpal tunnel syndrome presents for left hand pain specifically at the second, third and fourth MCP joints for the past 1 week.  Reports that she thinks she banged it on something but is  not sure what.  No improvement with Tylenol.  Saw [facility] yesterday and they thought her symptoms were related to carpal tunnel.  She says she had an x-ray of her wrist which is not the problem and was prescribed Medrol which she cannot take.  Also cannot take NSAIDs.  X-ray of hand obtained today shows no acute abnormality.  Discussed with patient.  Patient denies any history of arthritis, specifically rheumatoid arthritis.  Believe that she will improve with prednisone.  She says she is taken prednisone without difficulty.  Sent prednisone taper.  Patient requesting something to help with pain relief.  Patient given 6 tablets of Norco as needed for severe pain.  Reviewed RICE guidelines.  Reviewed following up with Ortho or [facility] for further workup if needed.   Final Clinical Impressions(s) / UC Diagnoses   Final diagnoses:  Left hand pain  Swelling of left hand     Discharge Instructions      -X-ray  normal. - Take the prednisone as prescribed. - I sent Norco as needed for severe pain.  You can also purchase over-the-counter Voltaren gel apply topically.  You have a condition requiring you to follow up with Orthopedics so please call one of the following office for appointment:   [facility] [address] [place], [place] [address] Phone: [phone]  [facility] [address] [place], [place] [address] Phone: [phone]    ED Prescriptions     Medication Sig Dispense Auth. Provider   predniSONE (DELTASONE) 10 MG tablet Take 6 tabs p.o. on day 1 and decrease by 1 tablet daily until complete 21 tablet Laurene Footman B, PA-C   HYDROcodone-acetaminophen (NORCO/VICODIN) 5-325 MG tablet Take 2 tablets by mouth every 8 (eight) hours as needed for up to 2 days for severe pain. 6 tablet Danton Clap, PA-C      I have reviewed the PDMP during this encounter.   Danton Clap, PA-C 07/27/22 1732

## 2022-07-28 ENCOUNTER — Ambulatory Visit: Payer: 59

## 2022-07-28 DIAGNOSIS — J324 Chronic pansinusitis: Secondary | ICD-10-CM | POA: Diagnosis not present

## 2022-07-28 DIAGNOSIS — J3489 Other specified disorders of nose and nasal sinuses: Secondary | ICD-10-CM | POA: Diagnosis not present

## 2022-08-02 ENCOUNTER — Other Ambulatory Visit: Payer: Self-pay

## 2022-08-04 ENCOUNTER — Other Ambulatory Visit: Payer: Self-pay

## 2022-08-04 MED ORDER — DOXYCYCLINE HYCLATE 100 MG PO CAPS
100.0000 mg | ORAL_CAPSULE | Freq: Two times a day (BID) | ORAL | 0 refills | Status: DC
  Filled 2022-08-04: qty 20, 10d supply, fill #0

## 2022-08-15 DIAGNOSIS — M79642 Pain in left hand: Secondary | ICD-10-CM | POA: Diagnosis not present

## 2022-08-16 ENCOUNTER — Other Ambulatory Visit (HOSPITAL_COMMUNITY): Payer: Self-pay

## 2022-08-23 ENCOUNTER — Ambulatory Visit
Admission: EM | Admit: 2022-08-23 | Discharge: 2022-08-23 | Disposition: A | Discharge status: Home / Self Care | Payer: 59 | Attending: Physician Assistant | Admitting: Physician Assistant

## 2022-08-23 ENCOUNTER — Encounter: Payer: Self-pay | Admitting: Emergency Medicine

## 2022-08-23 DIAGNOSIS — U071 COVID-19: Secondary | ICD-10-CM

## 2022-08-23 DIAGNOSIS — J029 Acute pharyngitis, unspecified: Secondary | ICD-10-CM | POA: Diagnosis not present

## 2022-08-23 DIAGNOSIS — R051 Acute cough: Secondary | ICD-10-CM | POA: Diagnosis not present

## 2022-08-23 DIAGNOSIS — M79642 Pain in left hand: Secondary | ICD-10-CM | POA: Diagnosis not present

## 2022-08-23 LAB — RESP PANEL BY RT-PCR (RSV, FLU A&B, COVID)  RVPGX2
Influenza A by PCR: NEGATIVE
Influenza B by PCR: NEGATIVE
Resp Syncytial Virus by PCR: NEGATIVE
SARS Coronavirus 2 by RT PCR: POSITIVE — AB

## 2022-08-23 LAB — GROUP A STREP BY PCR: Group A Strep by PCR: NOT DETECTED

## 2022-08-23 MED ORDER — CHERATUSSIN AC 100-10 MG/5ML PO SOLN: 10.0000 mL | Freq: Three times a day (TID) | ORAL | 0 refills | Status: DC | PRN

## 2022-08-23 MED ORDER — PAXLOVID (150/100) 10 X 150 MG & 10 X 100MG PO TBPK: 2.0000 | ORAL_TABLET | Freq: Two times a day (BID) | ORAL | 0 refills | Status: AC

## 2022-08-23 NOTE — Discharge Instructions (Addendum)
-  Positive COVID test.  I sent Paxlovid to the pharmacy.  Unsure if your insurance covers it or not.  I sent cough medicine.  Increase rest and fluids. - Need to be seen again if you have any uncontrolled fever, weakness or breathing difficulty. - You should isolate at home until you have been fever free for 24 hours without needing ibuprofen and Tylenol.

## 2022-08-23 NOTE — ED Triage Notes (Signed)
Pt c/o nasal congestion, cough, sore throat. Started about 2 days ago. Unsure if she has had a fever.

## 2022-08-23 NOTE — ED Provider Notes (Signed)
[facility]    CSN: [id number] Arrival date & time: 08/23/22  1550      History   Chief Complaint Chief Complaint  Patient presents with   Nasal Congestion   Cough    HPI Elizabeth Bowers is a 57 y.o. female presenting for 2-day history of feeling feverish with fatigue, headaches, body aches, cough, congestion, sore throat.  Reports sore throat is moderate to severe.  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  Also reports that she was treated for a MRSA sinus infection at the end of February.  Reports sinus pressure at this time and does not know if it is related to previous sinus infection.  She denies any breathing difficulty or wheezing, vomiting or diarrhea.  No other complaints.  HPI  Past Medical History:  Diagnosis Date   Fatty liver    Fibromyalgia    GERD (gastroesophageal reflux disease)    Hemorrhoids    Hot flashes    Hypertension    IBS (irritable bowel syndrome)    LUQ abdominal pain 10/25/2016   Migraines    Miscarriage    x 2; 4 live births    Multilevel degenerative disc disease    Neuroma    Vocal cord dysfunction     Patient Active Problem List   Diagnosis Date Noted   Cervical radiculopathy at C6 (Right) 04/04/2022   Chronic hip pain (Right) 08/16/2021   Impaired range of motion of hip (Right) 08/16/2021   Chronic lower extremity pain (Right) 08/16/2021   Lower extremity radicular pain (Right) 08/16/2021   Lumbar radiculitis (L3/L4) (Right) 08/16/2021   Morton's neuroma of foot (Left) 11/15/2020   MDD (major depressive disorder) 10/01/2020   Anterior tibialis tendinitis, right 04/28/2020   Insomnia 07/07/2019   Benign essential hypertension 04/22/2019   Radial styloid tenosynovitis of left hand 01/13/2019   Pain in right hand 01/13/2019   Bilateral carpal tunnel syndrome 01/13/2019   Chronic pain of left wrist 01/13/2019   Chronic low back pain (Bilateral) w/o sciatica 08/08/2018   Abnormal MRI, cervical spine  (07/10/2018) 07/17/2018   Cervical spondylitis with radiculitis (HCC) (C6) (Bilateral) (R>L) 06/26/2018   Chronic sacroiliac joint pain (Bilateral) (R>L) 06/26/2018   Cervical Grade 1 (2 mm) Retrolisthesis C5 over C6 03/20/2018   Cervical foraminal stenosis (C3-4 & C5-6) (Bilateral) 03/20/2018   DDD (degenerative disc disease), cervical 03/20/2018   Stage 3 chronic kidney disease ([place]) 03/20/2018   Acid reflux disease 03/20/2018   Spondylosis without myelopathy or radiculopathy, cervical region 03/20/2018   Chronic hip pain (Bilateral) (L>R) 03/20/2018   Lumbar facet syndrome (Bilateral) (R>L) 03/20/2018   Spondylosis without myelopathy or radiculopathy, lumbar region 03/20/2018   Chronic Sacroiliac joint dysfunction (Bilateral) (R>L) 03/20/2018   Chronic Somatic dysfunction of sacroiliac joint (Bilateral) (R>L) 03/20/2018   Osteoarthritis of hip (Bilateral) 03/20/2018   Cervicogenic headache (Bilateral) 03/20/2018   Cervicalgia (Bilateral) (R>L) 03/20/2018   Cervical facet syndrome (Bilateral) (R>L) 03/20/2018   Chronic occipital neuralgia (Bilateral) 03/20/2018   Chronic upper extremity pain (4th area of Pain) (Bilateral) (R>L) 03/19/2018   Ganglion cyst 02/20/2018   Chronic low back pain (2ry area of Pain) (Bilateral) (L>R) w/ sciatica (Bilateral) 01/16/2018   Chronic lower extremity pain (3ry area of Pain) (Bilateral) (L>R) 01/16/2018   Chronic neck pain (1ry area of Pain) (Bilateral) (midline) 01/16/2018   Chronic foot pain (Left) 01/16/2018   Chronic pain syndrome 01/16/2018   Pharmacologic therapy 01/16/2018   Disorder of skeletal system 01/16/2018  Problems influencing health status 01/16/2018   Class 1 obesity with serious comorbidity and body mass index (BMI) of 32.0 to 32.9 in adult 11/21/2017   Anxiety 09/28/2017   Chronic knee pain (Right) 09/28/2017   Fatty liver 07/09/2017   Colon polyps 07/09/2017   Fibromyalgia 07/09/2017   Sinusitis, chronic 07/09/2017   Allergic  rhinitis 07/09/2017   IBS (irritable bowel syndrome) 07/09/2017   Eyelid dermatitis, allergic/contact 07/09/2017   Chronic sacroiliac joint pain (Right) 12/16/2016   DDD (degenerative disc disease), lumbar 07/18/2016   Family hx of colon cancer 07/18/2016    Past Surgical History:  Procedure Laterality Date   ABDOMINAL HYSTERECTOMY     2012 removed cervix h/o abnormal pap    CHOLECYSTECTOMY     2000   COLONOSCOPY     2018 with + polpys and GIB 2/2 polyp removal    COLONOSCOPY WITH PROPOFOL N/A 12/22/2019   Procedure: COLONOSCOPY WITH PROPOFOL;  Surgeon: Lin Landsman, MD;  Location: [facility] ENDOSCOPY;  Service: Gastroenterology;  Laterality: N/A;   COLONOSCOPY WITH PROPOFOL N/A 12/23/2019   Procedure: COLONOSCOPY WITH PROPOFOL;  Surgeon: Lin Landsman, MD;  Location: [facility] ENDOSCOPY;  Service: Gastroenterology;  Laterality: N/A;  Last name pronounced LEE-MA   ELBOW DEBRIDEMENT     ESOPHAGOGASTRODUODENOSCOPY (EGD) WITH PROPOFOL N/A 02/06/2018   Procedure: ESOPHAGOGASTRODUODENOSCOPY (EGD) WITH PROPOFOL with biopsies;  Surgeon: Lin Landsman, MD;  Location: [place];  Service: Endoscopy;  Laterality: N/A;   ETHMOIDECTOMY N/A 09/28/2021   Procedure: TOTAL ETHMOIDECTOMY;  Surgeon: Carloyn Manner, MD;  Location: [place];  Service: ENT;  Laterality: N/A;   FOOT SURGERY     IMAGE GUIDED SINUS SURGERY N/A 09/28/2021   Procedure: IMAGE GUIDED SINUS SURGERY;  Surgeon: Carloyn Manner, MD;  Location: [place];  Service: ENT;  Laterality: N/A;  PLACED DISK ON OR CHARGE NURSE DESK 4-21  KP   MAXILLARY ANTROSTOMY Bilateral 09/28/2021   Procedure: MAXILLARY ANTROSTOMY WITHOUT TISSUE REMOVAL;  Surgeon: Carloyn Manner, MD;  Location: [place];  Service: ENT;  Laterality: Bilateral;   SPHENOIDECTOMY Bilateral 09/28/2021   Procedure: SPHENOIDOTOMY;  Surgeon: Carloyn Manner, MD;  Location: [place];  Service: ENT;  Laterality:  Bilateral;    OB History   No obstetric history on file.      Home Medications    Prior to Admission medications   Medication Sig Start Date End Date Taking? Authorizing Provider  amLODipine (NORVASC) 10 MG tablet Take 1 tablet (10 mg total) by mouth daily. 04/10/22  Yes Bacigalupo, [name], MD  buPROPion (WELLBUTRIN XL) 300 MG 24 hr tablet Take 1 tablet (300 mg total) by mouth daily. 07/24/22  Yes Bacigalupo, [name], MD  DULoxetine (CYMBALTA) 60 MG capsule Take 1 capsule (60 mg total) by mouth daily. 05/08/22  Yes Bacigalupo, [name], MD  fluticasone (FLONASE) 50 MCG/ACT nasal spray Place 2 sprays into both nostrils daily. 06/22/21  Yes Covington, Sarah M, PA-C  guaiFENesin-codeine (CHERATUSSIN AC) 100-10 MG/5ML syrup Take 10 mLs by mouth 3 (three) times daily as needed. 08/23/22  Yes Laurene Footman B, PA-C  ipratropium (ATROVENT) 0.06 % nasal spray Place 2 sprays into both nostrils 4 (four) times daily. 04/10/22  Yes Bacigalupo, [name], MD  lansoprazole (PREVACID) 30 MG capsule Take 1 capsule (30 mg total) by mouth 2 (two) times daily before a meal. 05/08/22  Yes Gwyneth Sprout, FNP  LINZESS 290 MCG CAPS capsule Take 1 capsule (290 mcg total) by mouth daily.  07/03/22  Yes Bacigalupo, [name], MD  Multiple Vitamin (MULTIVITAMIN WITH MINERALS) TABS tablet Take 1 tablet by mouth daily.   Yes [provider]  nirmatrelvir & ritonavir (PAXLOVID, 150/100,) 10 x 150 MG & 10 x 100MG  TBPK Take 2 tablets by mouth 2 (two) times daily for 5 days. 08/23/22 08/28/22 Yes Laurene Footman B, PA-C  ondansetron (ZOFRAN-ODT) 8 MG disintegrating tablet Take 1 tablet (8 mg total) by mouth every 8 (eight) hours as needed for nausea or vomiting. 02/20/22  Yes Margarette Canada, NP  predniSONE (DELTASONE) 10 MG tablet Take 6 tabs p.o. on day 1 and decrease by 1 tablet daily until complete 07/27/22  Yes Danton Clap, PA-C  triamterene-hydrochlorothiazide (MAXZIDE-25) 37.5-25 MG tablet Take 1 tablet by mouth daily.  06/01/22  Yes Bacigalupo, [name], MD  doxycycline (VIBRAMYCIN) 100 MG capsule Take 1 capsule (100 mg total) by mouth 2 (two) times daily for 10 days. 08/04/22       Family History Family History  Problem Relation Age of Onset   Hypertension Mother    Hyperlipidemia Mother    Glaucoma Mother    Diabetes Mother        type 2    COPD Father        emphysema   Alcohol abuse Father    Colon cancer Sister    Cancer Sister        rectal/colon cancer no h/o IBD   Colon polyps Sister    Asthma Sister    COPD Sister    Kidney disease Sister    Mental illness Sister        bipolar    Miscarriages / [place] Sister    Uterine cancer Sister    Alcohol abuse Son    Depression Son    Diabetes Son    Drug abuse Son    Mental illness Son        bipolar    Diabetes Maternal Grandmother    COPD Paternal Grandmother    Cancer Paternal Grandfather        FH colon cancer maternal aunts/uncles    Miscarriages / Stillbirths Sister    Arthritis Son    Arthritis Son    Breast cancer Neg Hx     Social History Social History   Tobacco Use   Smoking status: Former    Packs/day: 0.75    Years: 5.00    Additional pack years: 0.00    Total pack years: 3.75    Types: Cigarettes    Quit date: 07/06/1987    Years since quitting: 35.1   Smokeless tobacco: Never  Vaping Use   Vaping Use: Never used  Substance Use Topics   Alcohol use: Yes    Alcohol/week: 2.0 - 4.0 standard drinks of alcohol    Types: 2 - 4 Cans of beer per week    Comment: wine cooler   Drug use: Never     Allergies   Gluten meal, Lactose, Ranitidine, Amphetamine-dextroamphetamine, Lactose intolerance (gi), Reglan [metoclopramide], Topiramate er, Wheat, Soap, and Sulfa antibiotics   Review of Systems Review of Systems  Constitutional:  Positive for fatigue. Negative for chills, diaphoresis and fever.  HENT:  Positive for congestion, rhinorrhea, sinus pressure and sore throat. Negative for ear pain and sinus  pain.   Respiratory:  Positive for cough. Negative for shortness of breath.   Cardiovascular:  Negative for chest pain.  Gastrointestinal:  Negative for abdominal pain, nausea and vomiting.  Musculoskeletal:  Positive for  myalgias.  Skin:  Negative for rash.  Neurological:  Positive for headaches. Negative for weakness.  Hematological:  Negative for adenopathy.     Physical Exam Triage Vital Signs ED Triage Vitals  Enc Vitals Group     BP 08/23/22 1715 (!) 144/88     Pulse Rate 08/23/22 1715 (!) 106     Resp 08/23/22 1715 18     Temp 08/23/22 1715 100.2 F (37.9 C)     Temp Source 08/23/22 1715 Oral     SpO2 08/23/22 1715 96 %     Weight 08/23/22 1714 173 lb 11.6 oz (78.8 kg)     Height 08/23/22 1714 5\' 2"  (1.575 m)     Head Circumference --      Peak Flow --      Pain Score 08/23/22 1713 8     Pain Loc --      Pain Edu? --      Excl. in [place]? --    No data found.  Updated Vital Signs BP (!) 144/88 (BP Location: Right Arm)   Pulse (!) 106   Temp 100.2 F (37.9 C) (Oral)   Resp 18   Ht 5\' 2"  (1.575 m)   Wt 173 lb 11.6 oz (78.8 kg)   SpO2 96%   BMI 31.77 kg/m   Physical Exam Vitals and nursing note reviewed.  Constitutional:      General: She is not in acute distress.    Appearance: Normal appearance. She is ill-appearing. She is not toxic-appearing.  HENT:     Head: Normocephalic and atraumatic.     Nose: Congestion present.     Mouth/Throat:     Mouth: Mucous membranes are moist.     Pharynx: Oropharynx is clear. Posterior oropharyngeal erythema present.  Eyes:     General: No scleral icterus.       Right eye: No discharge.        Left eye: No discharge.     Conjunctiva/sclera: Conjunctivae normal.  Cardiovascular:     Rate and Rhythm: Normal rate and regular rhythm.     Heart sounds: Normal heart sounds.  Pulmonary:     Effort: Pulmonary effort is normal. No respiratory distress.     Breath sounds: Normal breath sounds.  Musculoskeletal:     Cervical  back: Neck supple.  Skin:    General: Skin is dry.  Neurological:     General: No focal deficit present.     Mental Status: She is alert. Mental status is at baseline.     Motor: No weakness.     Gait: Gait normal.  Psychiatric:        Mood and Affect: Mood normal.        Behavior: Behavior normal.        Thought Content: Thought content normal.      UC Treatments / Results  Labs (all labs ordered are listed, but only abnormal results are displayed) Labs Reviewed  RESP PANEL BY RT-PCR (RSV, FLU A&B, COVID)  [id number] - Abnormal; Notable for the following components:      Result Value   SARS Coronavirus 2 by RT PCR POSITIVE (*)    All other components within normal limits  GROUP A STREP BY PCR    EKG   Radiology No results found.  Procedures Procedures (including critical care time)  Medications Ordered in UC Medications - No data to display  Initial Impression / Assessment and Plan / UC Course  I have reviewed  the triage vital signs and the nursing notes.  Pertinent labs & imaging results that were available during my care of the patient were reviewed by me and considered in my medical decision making (see chart for details).   57 year old female presents for feeling feverish with fatigue, body aches, headaches, cough congestion sore throat x 2 days.  She is afebrile.  Temp currently 100.2 degrees.  Pulse elevated at 106 bpm.  She is ill-appearing but nontoxic.  On exam has nasal congestion and erythema posterior pharynx.  Chest clear to auscultation.  PCR strep test and respiratory panel obtained.  Positive COVID.  Will schedule patient.  Patient would like to take Paxlovid.  Sent this to pharmacy after reviewing that her GFR is 53 in  Nov 23.  Also sent Cheratussin.  Reviewed current CDC guidelines with patient.  Supportive care.  Work note given.  ED precautions discussed.   Final Clinical Impressions(s) / UC Diagnoses   Final diagnoses:  COVID-19  Acute cough   Sore throat     Discharge Instructions      -Positive COVID test.  I sent Paxlovid to the pharmacy.  Unsure if your insurance covers it or not.  I sent cough medicine.  Increase rest and fluids. - Need to be seen again if you have any uncontrolled fever, weakness or breathing difficulty. - You should isolate at home until you have been fever free for 24 hours without needing ibuprofen and Tylenol.     ED Prescriptions     Medication Sig Dispense Auth. Provider   nirmatrelvir & ritonavir (PAXLOVID, 150/100,) 10 x 150 MG & 10 x 100MG  TBPK Take 2 tablets by mouth 2 (two) times daily for 5 days. 20 tablet Laurene Footman B, PA-C   guaiFENesin-codeine (CHERATUSSIN AC) 100-10 MG/5ML syrup Take 10 mLs by mouth 3 (three) times daily as needed. 120 mL Danton Clap, PA-C      PDMP not reviewed this encounter.   Danton Clap, PA-C 08/23/22 1902

## 2022-08-24 ENCOUNTER — Other Ambulatory Visit: Payer: Self-pay

## 2022-08-24 ENCOUNTER — Telehealth: Payer: Self-pay | Admitting: Physician Assistant

## 2022-08-24 MED ORDER — CHERATUSSIN AC 100-10 MG/5ML PO SOLN
10.0000 mL | Freq: Three times a day (TID) | ORAL | 0 refills | Status: DC | PRN
  Filled 2022-08-24: qty 120, 4d supply, fill #0

## 2022-08-24 NOTE — Telephone Encounter (Signed)
Pharmacy all out of Cheratussin.  Canceled this prescription at pharmacy.  Resent prescription to pharmacy.

## 2022-08-29 ENCOUNTER — Other Ambulatory Visit: Payer: Self-pay

## 2022-08-29 DIAGNOSIS — R519 Headache, unspecified: Secondary | ICD-10-CM | POA: Diagnosis not present

## 2022-08-29 DIAGNOSIS — S63641A Sprain of metacarpophalangeal joint of right thumb, initial encounter: Secondary | ICD-10-CM | POA: Diagnosis not present

## 2022-08-29 DIAGNOSIS — M65332 Trigger finger, left middle finger: Secondary | ICD-10-CM | POA: Diagnosis not present

## 2022-08-29 DIAGNOSIS — J324 Chronic pansinusitis: Secondary | ICD-10-CM | POA: Diagnosis not present

## 2022-08-29 MED ORDER — PREDNISONE 10 MG PO TABS
ORAL_TABLET | ORAL | 0 refills | Status: DC
  Filled 2022-08-29: qty 21, 6d supply, fill #0

## 2022-08-29 MED ORDER — CLINDAMYCIN HCL 300 MG PO CAPS
300.0000 mg | ORAL_CAPSULE | Freq: Three times a day (TID) | ORAL | 0 refills | Status: DC
  Filled 2022-08-29: qty 42, 14d supply, fill #0

## 2022-08-30 ENCOUNTER — Other Ambulatory Visit (HOSPITAL_COMMUNITY): Payer: Self-pay

## 2022-09-04 ENCOUNTER — Other Ambulatory Visit: Payer: Self-pay | Admitting: Family Medicine

## 2022-09-04 DIAGNOSIS — K219 Gastro-esophageal reflux disease without esophagitis: Secondary | ICD-10-CM

## 2022-09-05 ENCOUNTER — Other Ambulatory Visit: Payer: Self-pay

## 2022-09-05 ENCOUNTER — Other Ambulatory Visit (HOSPITAL_COMMUNITY): Payer: Self-pay

## 2022-09-05 MED ORDER — LANSOPRAZOLE 30 MG PO CPDR
30.0000 mg | DELAYED_RELEASE_CAPSULE | Freq: Two times a day (BID) | ORAL | 1 refills | Status: DC
  Filled 2022-09-05 – 2022-11-07 (×2): qty 180, 90d supply, fill #0
  Filled 2022-11-17 – 2022-11-29 (×4): qty 180, 90d supply, fill #1

## 2022-09-18 ENCOUNTER — Telehealth: Payer: Self-pay | Admitting: Family Medicine

## 2022-09-18 ENCOUNTER — Other Ambulatory Visit: Payer: Self-pay | Admitting: Family Medicine

## 2022-09-18 DIAGNOSIS — Z1231 Encounter for screening mammogram for malignant neoplasm of breast: Secondary | ICD-10-CM

## 2022-10-02 ENCOUNTER — Other Ambulatory Visit: Payer: Self-pay | Admitting: Family Medicine

## 2022-10-02 ENCOUNTER — Other Ambulatory Visit (HOSPITAL_COMMUNITY): Payer: Self-pay

## 2022-10-02 MED ORDER — AMLODIPINE BESYLATE 10 MG PO TABS
10.0000 mg | ORAL_TABLET | Freq: Every day | ORAL | 1 refills | Status: DC
  Filled 2022-10-02: qty 90, 90d supply, fill #0
  Filled 2023-01-11: qty 90, 90d supply, fill #1

## 2022-10-09 ENCOUNTER — Encounter: Payer: 59 | Admitting: Family Medicine

## 2022-10-10 ENCOUNTER — Telehealth: Payer: Self-pay

## 2022-10-10 ENCOUNTER — Other Ambulatory Visit: Payer: Self-pay

## 2022-10-10 DIAGNOSIS — Z86010 Personal history of colonic polyps: Secondary | ICD-10-CM

## 2022-10-10 MED ORDER — ONDANSETRON HCL 4 MG PO TABS
4.0000 mg | ORAL_TABLET | Freq: Four times a day (QID) | ORAL | 0 refills | Status: DC | PRN
  Filled 2022-10-10: qty 10, 3d supply, fill #0

## 2022-10-10 NOTE — Telephone Encounter (Signed)
Patient stopped by the office to schedule her repeat colonoscopy with Dr. Allegra Lai.  Her colonoscopy has been scheduled 01/01/23 at [facility].  History of colon polyps. Last colonoscopy performed by Dr. Allegra Lai on 12/23/19.  Recommended repeat in 3 years.  Family history of colon cancer (sister, aunt, uncles).  No GI issues. Urgent Care visit 08/23/22 COVID.  Pt has been advised on the morning of her procedure to take only her blood pressure meds with just a sip of water.  Thanks,  [place], [place]

## 2022-10-10 NOTE — Addendum Note (Signed)
Addended by: Avie Arenas on: 10/10/2022 01:50 PM   Modules accepted: Orders

## 2022-10-10 NOTE — Telephone Encounter (Signed)
Pt received recall letter to schedule colonoscopy please return call

## 2022-10-10 NOTE — Telephone Encounter (Signed)
SuFlave prep has been provided to patient due to previous experiences with other preps.  Instructions reviewed for Suprep and noted in instructions.  Rx for zofran has been sent to pharmacy.  Qty 10 to be used during prep process Q6h PRN.  Thanks, [place], [place]

## 2022-10-19 ENCOUNTER — Other Ambulatory Visit: Payer: Self-pay

## 2022-10-19 ENCOUNTER — Ambulatory Visit
Admission: RE | Admit: 2022-10-19 | Discharge: 2022-10-19 | Disposition: A | Discharge status: Home / Self Care | Payer: 59 | Source: Ambulatory Visit | Attending: Family Medicine | Admitting: Family Medicine

## 2022-10-19 DIAGNOSIS — Z1231 Encounter for screening mammogram for malignant neoplasm of breast: Secondary | ICD-10-CM | POA: Insufficient documentation

## 2022-10-26 ENCOUNTER — Encounter: Payer: Self-pay | Admitting: Family Medicine

## 2022-10-26 ENCOUNTER — Other Ambulatory Visit: Payer: Self-pay

## 2022-10-26 ENCOUNTER — Ambulatory Visit (INDEPENDENT_AMBULATORY_CARE_PROVIDER_SITE_OTHER): Payer: 59 | Admitting: Family Medicine

## 2022-10-26 VITALS — BP 117/77 | HR 88 | Temp 97.8°F | Resp 16 | Ht 62.0 in | Wt 179.8 lb

## 2022-10-26 DIAGNOSIS — N1831 Chronic kidney disease, stage 3a: Secondary | ICD-10-CM | POA: Diagnosis not present

## 2022-10-26 DIAGNOSIS — D751 Secondary polycythemia: Secondary | ICD-10-CM

## 2022-10-26 DIAGNOSIS — R739 Hyperglycemia, unspecified: Secondary | ICD-10-CM

## 2022-10-26 DIAGNOSIS — Z0000 Encounter for general adult medical examination without abnormal findings: Secondary | ICD-10-CM | POA: Diagnosis not present

## 2022-10-26 DIAGNOSIS — E669 Obesity, unspecified: Secondary | ICD-10-CM

## 2022-10-26 DIAGNOSIS — I10 Essential (primary) hypertension: Secondary | ICD-10-CM

## 2022-10-26 DIAGNOSIS — F419 Anxiety disorder, unspecified: Secondary | ICD-10-CM | POA: Diagnosis not present

## 2022-10-26 DIAGNOSIS — Z6832 Body mass index (BMI) 32.0-32.9, adult: Secondary | ICD-10-CM | POA: Diagnosis not present

## 2022-10-26 DIAGNOSIS — F331 Major depressive disorder, recurrent, moderate: Secondary | ICD-10-CM | POA: Diagnosis not present

## 2022-10-26 DIAGNOSIS — K760 Fatty (change of) liver, not elsewhere classified: Secondary | ICD-10-CM

## 2022-10-26 DIAGNOSIS — N3281 Overactive bladder: Secondary | ICD-10-CM | POA: Diagnosis not present

## 2022-10-26 MED ORDER — OXYBUTYNIN CHLORIDE ER 5 MG PO TB24
5.0000 mg | ORAL_TABLET | Freq: Every day | ORAL | 5 refills | Status: DC
  Filled 2022-10-26: qty 30, 30d supply, fill #0
  Filled 2022-11-17 – 2022-11-20 (×2): qty 30, 30d supply, fill #1
  Filled 2022-12-11 – 2022-12-16 (×2): qty 30, 30d supply, fill #2
  Filled 2023-01-11: qty 30, 30d supply, fill #3
  Filled 2023-02-15: qty 30, 30d supply, fill #4
  Filled 2023-03-14: qty 30, 30d supply, fill #5

## 2022-10-26 NOTE — Assessment & Plan Note (Signed)
Discussed importance of healthy weight management Discussed diet and exercise  

## 2022-10-26 NOTE — Assessment & Plan Note (Signed)
Chronic and stable. Ordered CMP.

## 2022-10-26 NOTE — Progress Notes (Signed)
Complete physical exam  Patient: Elizabeth Bowers   DOB: 06-27-1965   57 y.o. Female  MRN: [id number]  Subjective:    Chief Complaint  Patient presents with   Annual Exam    Elizabeth Bowers is a 57 y.o. female who presents today for a complete physical exam. She reports consuming a low sodium diet.  Walks a lot at home, works on abs at home, and plays with her 69 year old granddaughter .  She generally feels fairly well. She reports sleeping poorly. She does have additional problems to discuss today.   Frequent Nocturia  Cannot sleep through the night due to having to pee every hour to 1.5 hours. Feels that she completely empties her bladder each time. Does not have the same frequency during the day.     Most recent fall risk assessment:    10/26/2022    2:10 PM  Fall Risk   Falls in the past year? 0  Number falls in past yr: 0  Injury with Fall? 0  Risk for fall due to : No Fall Risks  Follow up Falls evaluation completed     Most recent depression screenings:    10/26/2022    2:10 PM 06/15/2022   10:51 AM  PHQ 2/9 Scores  PHQ - 2 Score 2 2  PHQ- 9 Score 11 8        Patient Care Team: Erasmo Downer, MD as PCP - General (Family Medicine)   Outpatient Medications Prior to Visit  Medication Sig   amLODipine (NORVASC) 10 MG tablet Take 1 tablet (10 mg) by mouth daily.   BUDESONIDE IN ADD (SQUEEZE 0.5ML TWICE=1ML DOSE) OF MEDICATION TO OF SALINE IN SALINE IRRIGATION BOTTLE; IRRIGATE SINUSES WITH THROUGH EACH NOSTRIL TWICE DAILY   buPROPion (WELLBUTRIN XL) 300 MG 24 hr tablet Take 1 tablet (300 mg total) by mouth daily.   DULoxetine (CYMBALTA) 60 MG capsule Take 1 capsule (60 mg total) by mouth daily.   lansoprazole (PREVACID) 30 MG capsule Take 1 capsule (30 mg total) by mouth 2 (two) times daily before a meal.   LINZESS 290 MCG CAPS capsule Take 1 capsule (290 mcg total) by mouth daily.   Multiple Vitamin (MULTIVITAMIN WITH MINERALS) TABS tablet  Take 1 tablet by mouth daily.   ondansetron (ZOFRAN) 4 MG tablet Take 1 tablet (4 mg total) by mouth every 6 (six) hours as needed for nausea or vomiting. Use as needed for Nausea associated with colonoscopy prep.   ondansetron (ZOFRAN-ODT) 8 MG disintegrating tablet Take 1 tablet (8 mg total) by mouth every 8 (eight) hours as needed for nausea or vomiting.   triamterene-hydrochlorothiazide (MAXZIDE-25) 37.5-25 MG tablet Take 1 tablet by mouth daily.   [DISCONTINUED] clindamycin (CLEOCIN) 300 MG capsule Take 1 capsule (300 mg total) by mouth 3 (three) times daily for 14 days   [DISCONTINUED] doxycycline (VIBRAMYCIN) 100 MG capsule Take 1 capsule (100 mg total) by mouth 2 (two) times daily for 10 days.   [DISCONTINUED] fluticasone (FLONASE) 50 MCG/ACT nasal spray Place 2 sprays into both nostrils daily.   [DISCONTINUED] guaiFENesin-codeine (CHERATUSSIN AC) 100-10 MG/5ML syrup Take 10 mLs by mouth 3 (three) times daily as needed for cough or congestion.   [DISCONTINUED] ipratropium (ATROVENT) 0.06 % nasal spray Place 2 sprays into both nostrils 4 (four) times daily.   [DISCONTINUED] predniSONE (DELTASONE) 10 MG tablet Take 6 tabs p.o. on day 1 and decrease by 1 tablet daily until complete (Patient not  taking: Reported on 10/26/2022)   [DISCONTINUED] predniSONE (DELTASONE) 10 MG tablet Take 6 tablets by mouth on day 1, then decrease by one tablet each day (Patient not taking: Reported on 10/26/2022)   No facility-administered medications prior to visit.    Review of Systems  Constitutional:  Positive for malaise/fatigue.  HENT:  Positive for congestion and sinus pain.   Eyes:  Positive for photophobia.       Eye itching   Cardiovascular:  Positive for leg swelling.  Gastrointestinal:  Positive for nausea.  Genitourinary:  Positive for frequency. Negative for dysuria, flank pain, hematuria and urgency.  Musculoskeletal:  Positive for back pain and myalgias.  Neurological:  Positive for dizziness.        Light-headedness   Endo/Heme/Allergies:  Bruises/bleeds easily.  Psychiatric/Behavioral:  The patient is nervous/anxious.           Objective:     BP 117/77 (BP Location: Left Arm, Patient Position: Sitting, Cuff Size: Normal)   Pulse 88   Temp 97.8 F (36.6 C) (Temporal)   Resp 16   Ht 5\' 2"  (1.575 m)   Wt 179 lb 12.8 oz (81.6 kg)   BMI 32.89 kg/m    Physical Exam Constitutional:      General: She is not in acute distress. HENT:     Head: Normocephalic and atraumatic.  Cardiovascular:     Pulses: Normal pulses.     Heart sounds: Normal heart sounds.  Pulmonary:     Effort: Pulmonary effort is normal.     Breath sounds: Normal breath sounds.  Musculoskeletal:     Cervical back: Normal range of motion.     Right lower leg: Edema present.     Left lower leg: Edema present.  Skin:    General: Skin is warm and dry.     Capillary Refill: Capillary refill takes less than 2 seconds.  Neurological:     Mental Status: She is alert.      No results found for any visits on 10/26/22.     Assessment & Plan:    Routine Health Maintenance and Physical Exam  Immunization History  Administered Date(s) Administered   Influenza,inj,Quad PF,6+ Mos 03/17/2019, 02/12/2020   Influenza-Unspecified 02/14/2018, 03/07/2021, 03/22/2022   PFIZER(Purple Top)SARS-COV-2 Vaccination 06/30/2019, 07/22/2019, 03/19/2020   Tdap 03/05/2017   Zoster Recombinat (Shingrix) 06/07/2020, 10/01/2020    Health Maintenance  Topic Date Due   COVID-19 Vaccine (4 - 2023-24 season) 02/03/2022   Colonoscopy  12/23/2022   INFLUENZA VACCINE  01/04/2023   MAMMOGRAM  10/18/2024   DTaP/Tdap/Td (2 - Td or Tdap) 03/06/2027   Hepatitis C Screening  Completed   HIV Screening  Completed   Zoster Vaccines- Shingrix  Completed   HPV VACCINES  Aged Out    Discussed health benefits of physical activity, and encouraged her to engage in regular exercise appropriate for her age and condition.  Problem  List Items Addressed This Visit     Fatty liver    Mildly elevated ALT on recent labs. Ordered CMP.       Relevant Orders   Comprehensive metabolic panel   Anxiety    Well controlled today. Continue current medications.      Class 1 obesity with serious comorbidity and body mass index (BMI) of 32.0 to 32.9 in adult    Discussed importance of healthy weight management Discussed diet and exercise       Stage 3 chronic kidney disease (HCC)    Chronic and stable. Ordered  CMP.       Relevant Orders   Comprehensive metabolic panel   Benign essential hypertension    Chronic and well controlled. Continue current medications. Ordered CMP.      Relevant Orders   Comprehensive metabolic panel   MDD (major depressive disorder)    Well controlled at today's visit. Continue current medications.      OAB (overactive bladder)    Frequent nocturia c/w over active bladder.  Start oxybutynin 5 mg. Patient advised to return if symptoms worsen or do not improve.      Other Visit Diagnoses     Encounter for annual physical exam    -  Primary   Relevant Orders   Hemoglobin A1c   Comprehensive metabolic panel   CBC w/Diff/Platelet   Hyperglycemia   Noted on previous labs. Not currently managed by medication. Continue to manage with diet and exercise.  Ordered Hgb A1c.   Relevant Orders   Hemoglobin A1c   Polycythemia   Diagnosed previously by another specialty, patient is not sure which.  Ordered CBC w/diff.    Relevant Orders   CBC w/Diff/Platelet      Return in about 6 months (around 04/28/2023) for CPE.     Ezekiel Slocumb MS3   Patient seen along with MS3 student Ezekiel Slocumb. I personally evaluated this patient along with the student, and verified all aspects of the history, physical exam, and medical decision making as documented by the student. I agree with the student's documentation and have made all necessary edits.  Brynli Ollis, Marzella Schlein, MD, MPH [facility]

## 2022-10-26 NOTE — Assessment & Plan Note (Signed)
Well controlled at today's visit. Continue current medications.

## 2022-10-26 NOTE — Assessment & Plan Note (Signed)
Chronic and well controlled. Continue current medications.  Ordered CMP. 

## 2022-10-26 NOTE — Assessment & Plan Note (Signed)
Well controlled today. Continue current medications 

## 2022-10-26 NOTE — Assessment & Plan Note (Signed)
Mildly elevated ALT on recent labs. Ordered CMP.

## 2022-10-26 NOTE — Assessment & Plan Note (Signed)
Frequent nocturia c/w over active bladder.  Start oxybutynin 5 mg.

## 2022-11-02 DIAGNOSIS — I10 Essential (primary) hypertension: Secondary | ICD-10-CM | POA: Diagnosis not present

## 2022-11-02 DIAGNOSIS — N1831 Chronic kidney disease, stage 3a: Secondary | ICD-10-CM | POA: Diagnosis not present

## 2022-11-02 DIAGNOSIS — R739 Hyperglycemia, unspecified: Secondary | ICD-10-CM | POA: Diagnosis not present

## 2022-11-02 DIAGNOSIS — D751 Secondary polycythemia: Secondary | ICD-10-CM | POA: Diagnosis not present

## 2022-11-02 DIAGNOSIS — Z0000 Encounter for general adult medical examination without abnormal findings: Secondary | ICD-10-CM | POA: Diagnosis not present

## 2022-11-02 DIAGNOSIS — K760 Fatty (change of) liver, not elsewhere classified: Secondary | ICD-10-CM | POA: Diagnosis not present

## 2022-11-03 LAB — CBC WITH DIFFERENTIAL/PLATELET
Basophils Absolute: 0.1 10*3/uL (ref 0.0–0.2)
Basos: 1 %
EOS (ABSOLUTE): 0.1 10*3/uL (ref 0.0–0.4)
Eos: 2 %
Hematocrit: 43.5 % (ref 34.0–46.6)
Hemoglobin: 15.1 g/dL (ref 11.1–15.9)
Immature Grans (Abs): 0.1 10*3/uL (ref 0.0–0.1)
Immature Granulocytes: 1 %
Lymphocytes Absolute: 1.2 10*3/uL (ref 0.7–3.1)
Lymphs: 23 %
MCH: 31.6 pg (ref 26.6–33.0)
MCHC: 34.7 g/dL (ref 31.5–35.7)
MCV: 91 fL (ref 79–97)
Monocytes Absolute: 0.6 10*3/uL (ref 0.1–0.9)
Monocytes: 11 %
Neutrophils Absolute: 3.3 10*3/uL (ref 1.4–7.0)
Neutrophils: 62 %
Platelets: 281 10*3/uL (ref 150–450)
RBC: 4.78 x10E6/uL (ref 3.77–5.28)
RDW: 12.5 % (ref 11.7–15.4)
WBC: 5.3 10*3/uL (ref 3.4–10.8)

## 2022-11-03 LAB — COMPREHENSIVE METABOLIC PANEL
ALT: 56 IU/L — ABNORMAL HIGH (ref 0–32)
AST: 39 IU/L (ref 0–40)
Albumin/Globulin Ratio: 2 (ref 1.2–2.2)
Albumin: 4.4 g/dL (ref 3.8–4.9)
Alkaline Phosphatase: 100 IU/L (ref 44–121)
BUN/Creatinine Ratio: 13 (ref 9–23)
BUN: 13 mg/dL (ref 6–24)
Bilirubin Total: 0.5 mg/dL (ref 0.0–1.2)
CO2: 24 mmol/L (ref 20–29)
Calcium: 9.4 mg/dL (ref 8.7–10.2)
Chloride: 102 mmol/L (ref 96–106)
Creatinine, Ser: 1.01 mg/dL — ABNORMAL HIGH (ref 0.57–1.00)
Globulin, Total: 2.2 g/dL (ref 1.5–4.5)
Glucose: 105 mg/dL — ABNORMAL HIGH (ref 70–99)
Potassium: 3.9 mmol/L (ref 3.5–5.2)
Sodium: 142 mmol/L (ref 134–144)
Total Protein: 6.6 g/dL (ref 6.0–8.5)
eGFR: 65 mL/min/{1.73_m2} (ref >59)

## 2022-11-03 LAB — HEMOGLOBIN A1C
Est. average glucose Bld gHb Est-mCnc: 108 mg/dL
Hgb A1c MFr Bld: 5.4 % (ref 4.8–5.6)

## 2022-11-03 IMAGING — CR DG LUMBAR SPINE COMPLETE W/ BEND
7 series · 7 of 7 positions shown · non-contrast
Comparison: Lumbar spine radiograph dated 01/22/2018.

CLINICAL DATA: Back pain.

EXAM:
LUMBAR SPINE - COMPLETE WITH BENDING VIEWS

[l-spine ap]
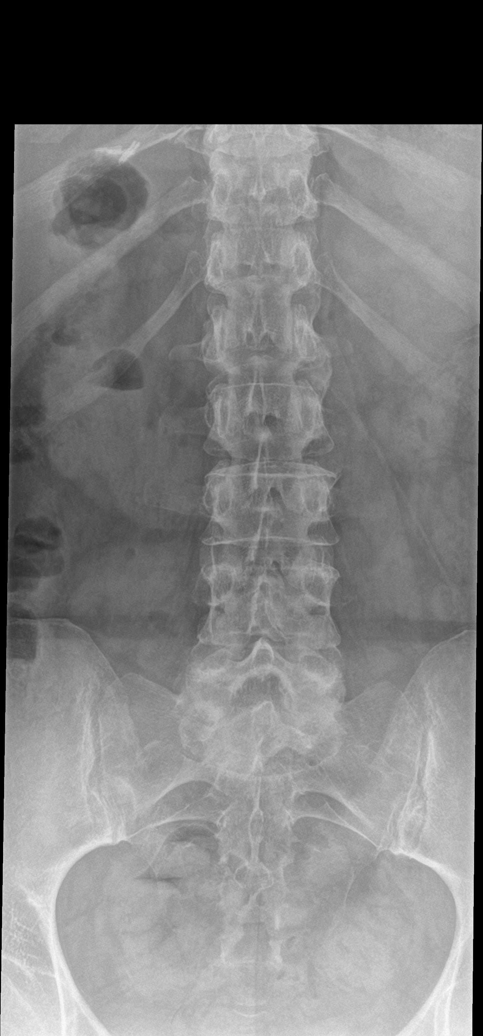

[l-spine obl (1 of 2)]
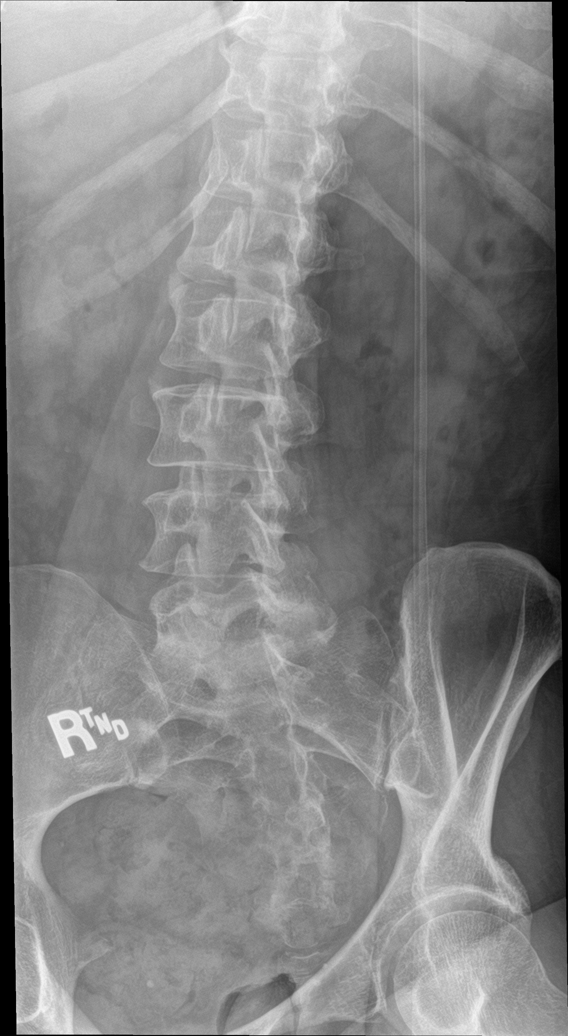

[l-spine obl (2 of 2)]
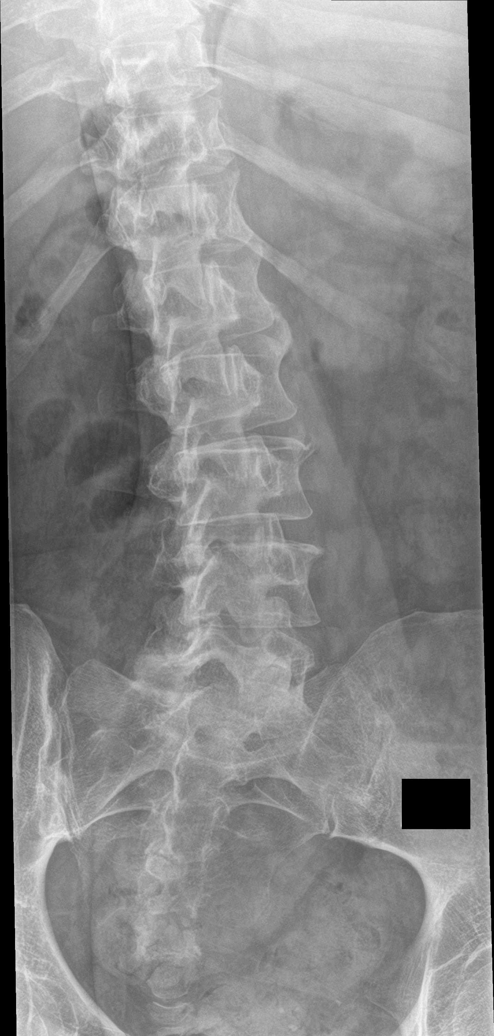

[l-spine lat]
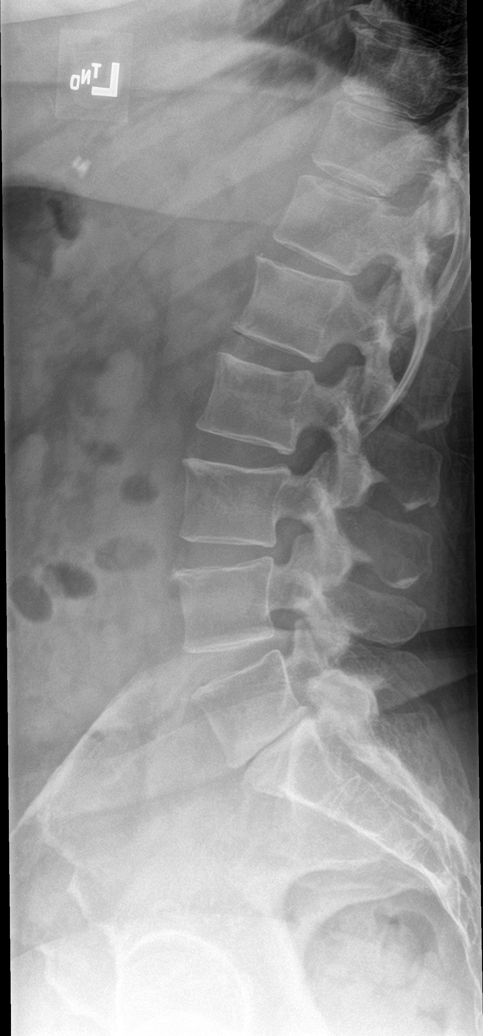

[l-spine flex]
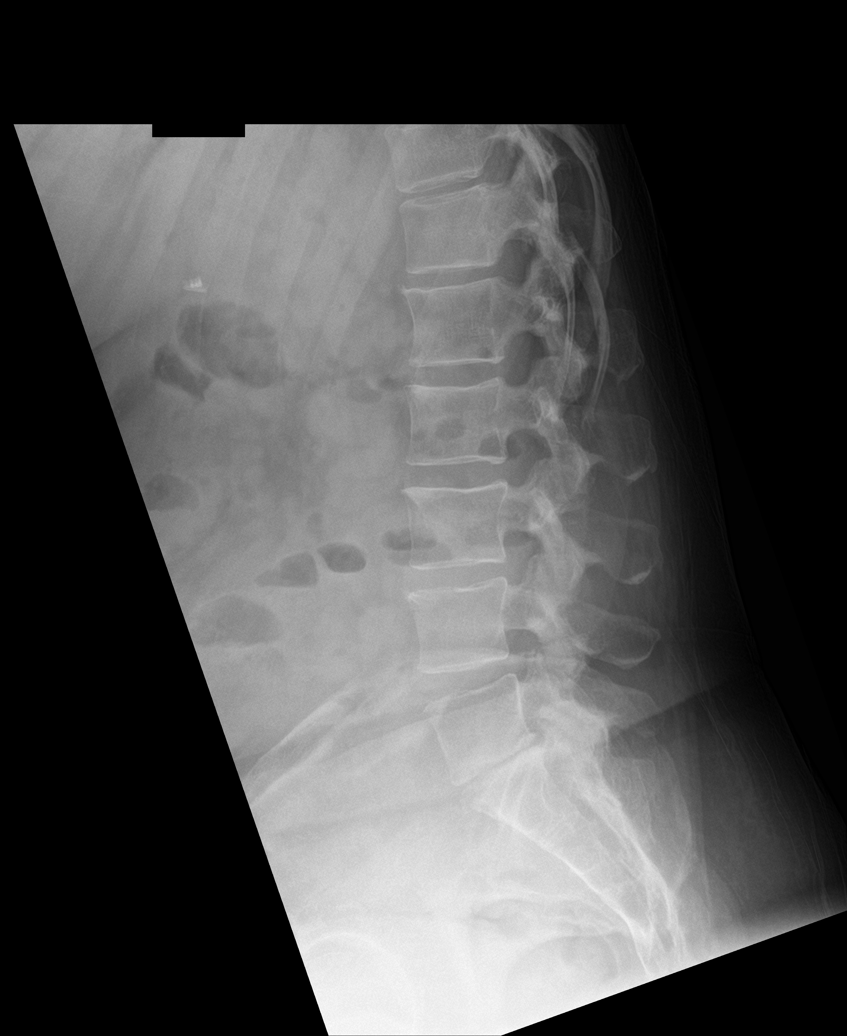

[l-spine ext]
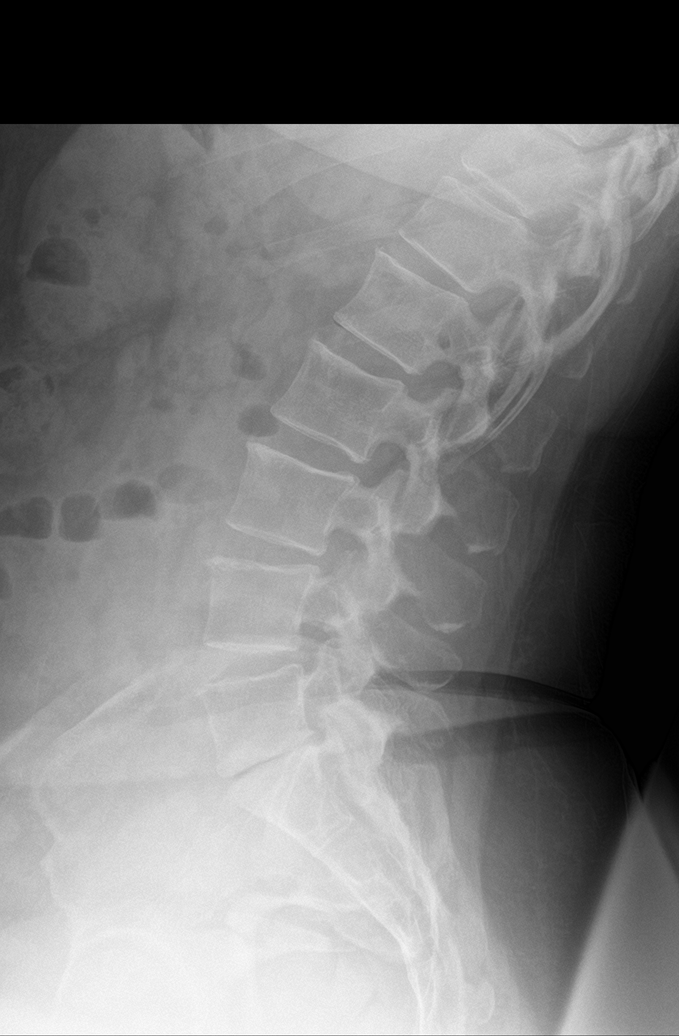

[l-spine spot]
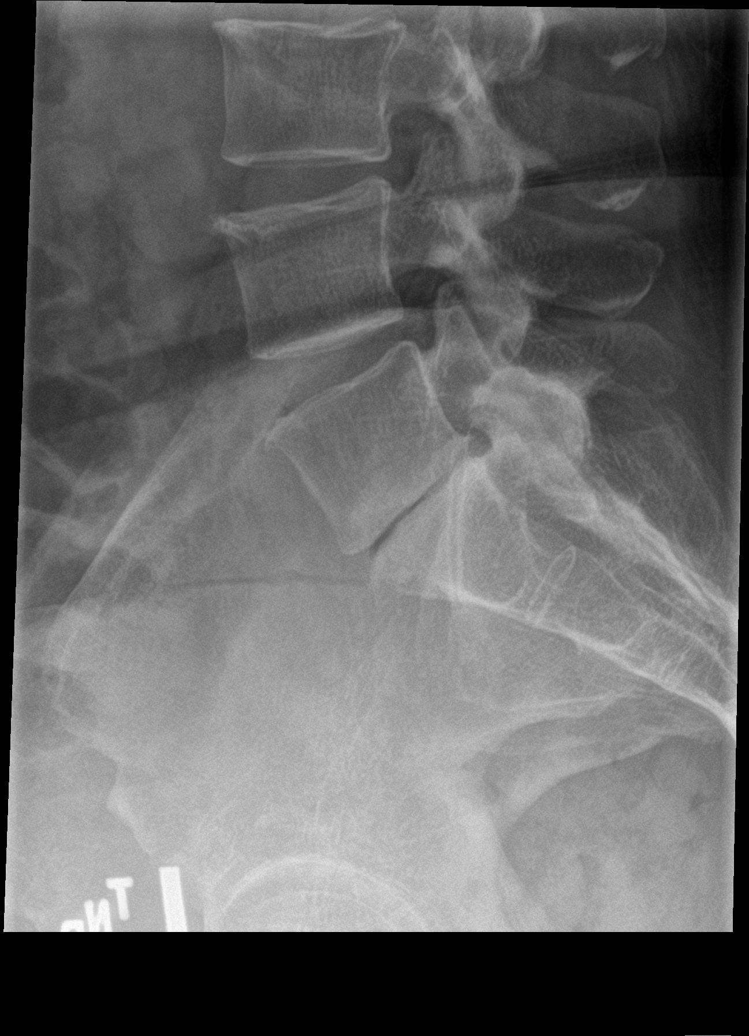

[7 of 7 positions shown; findings below may reference images not displayed]

FINDINGS: Five lumbar type vertebra. There is no acute fracture or subluxation
of the lumbar spine. There is degenerative changes with disc space
narrowing most prominent at L5-S1. The visualized posterior elements
are intact. No listhesis on flexion or extension. Right upper
quadrant cholecystectomy clips. The soft tissues are unremarkable.
IMPRESSION: No acute/traumatic lumbar spine pathology.

## 2022-11-07 ENCOUNTER — Other Ambulatory Visit: Payer: Self-pay | Admitting: Family Medicine

## 2022-11-08 ENCOUNTER — Other Ambulatory Visit: Payer: Self-pay

## 2022-11-08 MED ORDER — DULOXETINE HCL 60 MG PO CPEP
60.0000 mg | ORAL_CAPSULE | Freq: Every day | ORAL | 1 refills | Status: DC
  Filled 2022-11-08: qty 90, 90d supply, fill #0
  Filled 2022-11-17 – 2022-11-29 (×4): qty 90, 90d supply, fill #1

## 2022-11-08 NOTE — Telephone Encounter (Signed)
Labs in date  Requested Prescriptions  Pending Prescriptions Disp Refills   DULoxetine (CYMBALTA) 60 MG capsule 90 capsule 1    Sig: Take 1 capsule (60 mg total) by mouth daily.     Psychiatry: Antidepressants - SNRI - duloxetine Failed - 11/07/2022  5:06 PM      Failed - Cr in normal range and within 360 days    Creatinine, Ser  Date Value Ref Range Status  11/02/2022 1.01 (H) 0.57 - 1.00 mg/dL Final         Passed - eGFR is 30 or above and within 360 days    GFR calc Af Amer  Date Value Ref Range Status  07/07/2019 >60 >60 mL/min Final   GFR, Estimated  Date Value Ref Range Status  05/05/2021 56 (L) >60 mL/min Final    Comment:    (NOTE) Calculated using the CKD-EPI Creatinine Equation (2021)    eGFR  Date Value Ref Range Status  11/02/2022 65 >59 mL/min/1.73 Final         Passed - Completed PHQ-2 or PHQ-9 in the last 360 days      Passed - Last BP in normal range    BP Readings from Last 1 Encounters:  10/26/22 117/77         Passed - Valid encounter within last 6 months    Recent Outpatient Visits           1 week ago Encounter for annual physical exam   [facility] [place], Marzella Schlein, MD   4 months ago Urethra disorder   [facility] [place], Marzella Schlein, MD   7 months ago Benign essential hypertension   [place] [facility] [facility] [place], Marzella Schlein, MD   1 year ago Encounter for annual physical exam   [place] [facility] [place], Marzella Schlein, MD   1 year ago Benign essential hypertension   [place] [facility] [place], Marzella Schlein, MD       Future Appointments             In 5 months Bacigalupo, Marzella Schlein, MD [facility], [facility]

## 2022-11-17 ENCOUNTER — Other Ambulatory Visit: Payer: Self-pay

## 2022-11-17 ENCOUNTER — Other Ambulatory Visit: Payer: Self-pay | Admitting: Family Medicine

## 2022-11-17 MED ORDER — TRIAMTERENE-HCTZ 37.5-25 MG PO TABS
1.0000 | ORAL_TABLET | Freq: Every day | ORAL | 1 refills | Status: DC
  Filled 2022-11-17: qty 90, 90d supply, fill #0
  Filled 2023-03-02: qty 90, 90d supply, fill #1

## 2022-11-17 NOTE — Telephone Encounter (Signed)
Requested Prescriptions  Pending Prescriptions Disp Refills   triamterene-hydrochlorothiazide (MAXZIDE-25) 37.5-25 MG tablet 90 tablet 1    Sig: Take 1 tablet by mouth daily.     Cardiovascular: Diuretic Combos Failed - 11/17/2022 10:29 AM      Failed - Cr in normal range and within 180 days    Creatinine, Ser  Date Value Ref Range Status  11/02/2022 1.01 (H) 0.57 - 1.00 mg/dL Final         Passed - K in normal range and within 180 days    Potassium  Date Value Ref Range Status  11/02/2022 3.9 3.5 - 5.2 mmol/L Final         Passed - Na in normal range and within 180 days    Sodium  Date Value Ref Range Status  11/02/2022 142 134 - 144 mmol/L Final         Passed - Last BP in normal range    BP Readings from Last 1 Encounters:  10/26/22 117/77         Passed - Valid encounter within last 6 months    Recent Outpatient Visits           3 weeks ago Encounter for annual physical exam   [facility] [place], Marzella Schlein, MD   5 months ago Urethra disorder   [facility] [place], Marzella Schlein, MD   7 months ago Benign essential hypertension   [place] [facility] [place], Marzella Schlein, MD   1 year ago Encounter for annual physical exam   [place] [facility] [place], Marzella Schlein, MD   1 year ago Benign essential hypertension    [facility] [place], Marzella Schlein, MD       Future Appointments             In 5 months Bacigalupo, Marzella Schlein, MD [facility], [facility]

## 2022-11-20 ENCOUNTER — Other Ambulatory Visit: Payer: Self-pay

## 2022-11-23 ENCOUNTER — Other Ambulatory Visit: Payer: Self-pay

## 2022-11-24 ENCOUNTER — Other Ambulatory Visit (HOSPITAL_COMMUNITY): Payer: Self-pay

## 2022-11-27 ENCOUNTER — Ambulatory Visit
Admission: RE | Admit: 2022-11-27 | Discharge: 2022-11-27 | Disposition: A | Discharge status: Home / Self Care | Payer: 59 | Source: Ambulatory Visit | Attending: Family Medicine | Admitting: Family Medicine

## 2022-11-27 ENCOUNTER — Other Ambulatory Visit (HOSPITAL_COMMUNITY): Payer: Self-pay

## 2022-11-27 ENCOUNTER — Other Ambulatory Visit: Payer: Self-pay

## 2022-11-27 VITALS — BP 155/88 | HR 85 | Temp 99.4°F

## 2022-11-27 DIAGNOSIS — B349 Viral infection, unspecified: Secondary | ICD-10-CM | POA: Diagnosis not present

## 2022-11-27 DIAGNOSIS — R519 Headache, unspecified: Secondary | ICD-10-CM | POA: Diagnosis not present

## 2022-11-27 LAB — SARS CORONAVIRUS 2 BY RT PCR: SARS Coronavirus 2 by RT PCR: NEGATIVE

## 2022-11-27 MED ORDER — ONDANSETRON 8 MG PO TBDP
8.0000 mg | ORAL_TABLET | Freq: Three times a day (TID) | ORAL | 0 refills | Status: DC | PRN
  Filled 2022-11-27: qty 20, 7d supply, fill #0

## 2022-11-27 MED ORDER — ONDANSETRON HCL 4 MG/2ML IJ SOLN
4.0000 mg | Freq: Once | INTRAMUSCULAR | Status: AC
  Administered 2022-11-27: 4 mg via INTRAMUSCULAR

## 2022-11-27 NOTE — Discharge Instructions (Signed)
Your COVID test is negative. If your were prescribed medication, stop by the pharmacy to pick them up.  Follow up with your neurologist regarding your headache if not improving. Go to the ED if you headache suddenly worsens.    You can take Tylenol and/or Ibuprofen as needed for fever reduction and pain relief.    For cough: honey 1/2 to 1 teaspoon (you can dilute the honey in water or another fluid).  You can also use guaifenesin and dextromethorphan for cough. You can use a humidifier for chest congestion and cough.  If you don't have a humidifier, you can sit in the bathroom with the hot shower running.      For sore throat: try warm salt water gargles, Mucinex sore throat cough drops or cepacol lozenges, throat spray, warm tea or water with lemon/honey, popsicles or ice, or OTC cold relief medicine for throat discomfort. You can also purchase chloraseptic spray at the pharmacy or dollar store.   For congestion: take a daily anti-histamine like Zyrtec, Claritin, and a oral decongestant, such as pseudoephedrine.  You can also use Flonase 1-2 sprays in each nostril daily. Afrin is also a good option, if you do not have high blood pressure.    It is important to stay hydrated: drink plenty of fluids (water, gatorade/powerade/pedialyte, juices, or teas) to keep your throat moisturized and help further relieve irritation/discomfort.    Return or go to the Emergency Department if symptoms worsen or do not improve in the next few days

## 2022-11-27 NOTE — ED Triage Notes (Signed)
Pt presents to UC c/o headache, loss of appetite onset x2 days ago. Pt states the last time she felt like this she had covid.

## 2022-11-27 NOTE — ED Provider Notes (Signed)
[facility]    CSN: [id number] Arrival date & time: 11/27/22  1357      History   Chief Complaint Chief Complaint  Patient presents with   Headache    Possible covid - Entered by patient    HPI Elizabeth Bowers is a 57 y.o. female.   HPI  History obtained from the patient. Rylen presents for headache, nausea, dizziness, fatigue that started 2 days ago. She has not taken anything for her symptoms. She had to sit on a bench and needed to rest to get through a shower. Has had decreased appetite. The last time she felt like this she had COVID. No known sick contacts. Denies sore throat, vomiting, diarrhea, cough. Believes she has a sinus infection as she has chronic sinus issues and currently has green mucus.   Has history of migraines but has not had one in 4 years after getting shots in her neck. This is not typical of her migraine episodes and doesn't think this is a migraine.    Past Medical History:  Diagnosis Date   Fatty liver    Fibromyalgia    GERD (gastroesophageal reflux disease)    Hemorrhoids    Hot flashes    Hypertension    IBS (irritable bowel syndrome)    LUQ abdominal pain 10/25/2016   Migraines    Miscarriage    x 2; 4 live births    Multilevel degenerative disc disease    Neuroma    Vocal cord dysfunction     Patient Active Problem List   Diagnosis Date Noted   OAB (overactive bladder) 10/26/2022   Cervical radiculopathy at C6 (Right) 04/04/2022   Chronic hip pain (Right) 08/16/2021   Impaired range of motion of hip (Right) 08/16/2021   Chronic lower extremity pain (Right) 08/16/2021   Lower extremity radicular pain (Right) 08/16/2021   Lumbar radiculitis (L3/L4) (Right) 08/16/2021   Morton's neuroma of foot (Left) 11/15/2020   MDD (major depressive disorder) 10/01/2020   Anterior tibialis tendinitis, right 04/28/2020   Insomnia 07/07/2019   Benign essential hypertension 04/22/2019   Radial styloid tenosynovitis of left hand  01/13/2019   Pain in right hand 01/13/2019   Bilateral carpal tunnel syndrome 01/13/2019   Chronic pain of left wrist 01/13/2019   Chronic low back pain (Bilateral) w/o sciatica 08/08/2018   Abnormal MRI, cervical spine (07/10/2018) 07/17/2018   Cervical spondylitis with radiculitis (HCC) (C6) (Bilateral) (R>L) 06/26/2018   Chronic sacroiliac joint pain (Bilateral) (R>L) 06/26/2018   Cervical Grade 1 (2 mm) Retrolisthesis C5 over C6 03/20/2018   Cervical foraminal stenosis (C3-4 & C5-6) (Bilateral) 03/20/2018   DDD (degenerative disc disease), cervical 03/20/2018   Stage 3 chronic kidney disease (HCC) 03/20/2018   Acid reflux disease 03/20/2018   Spondylosis without myelopathy or radiculopathy, cervical region 03/20/2018   Chronic hip pain (Bilateral) (L>R) 03/20/2018   Lumbar facet syndrome (Bilateral) (R>L) 03/20/2018   Spondylosis without myelopathy or radiculopathy, lumbar region 03/20/2018   Chronic Sacroiliac joint dysfunction (Bilateral) (R>L) 03/20/2018   Chronic Somatic dysfunction of sacroiliac joint (Bilateral) (R>L) 03/20/2018   Osteoarthritis of hip (Bilateral) 03/20/2018   Cervicogenic headache (Bilateral) 03/20/2018   Cervicalgia (Bilateral) (R>L) 03/20/2018   Cervical facet syndrome (Bilateral) (R>L) 03/20/2018   Chronic occipital neuralgia (Bilateral) 03/20/2018   Chronic upper extremity pain (4th area of Pain) (Bilateral) (R>L) 03/19/2018   Ganglion cyst 02/20/2018   Chronic low back pain (2ry area of Pain) (Bilateral) (L>R) w/ sciatica (Bilateral) 01/16/2018   Chronic  lower extremity pain (3ry area of Pain) (Bilateral) (L>R) 01/16/2018   Chronic neck pain (1ry area of Pain) (Bilateral) (midline) 01/16/2018   Chronic foot pain (Left) 01/16/2018   Chronic pain syndrome 01/16/2018   Pharmacologic therapy 01/16/2018   Disorder of skeletal system 01/16/2018   Problems influencing health status 01/16/2018   Class 1 obesity with serious comorbidity and body mass index  (BMI) of 32.0 to 32.9 in adult 11/21/2017   Anxiety 09/28/2017   Chronic knee pain (Right) 09/28/2017   Fatty liver 07/09/2017   Colon polyps 07/09/2017   Fibromyalgia 07/09/2017   Sinusitis, chronic 07/09/2017   Allergic rhinitis 07/09/2017   IBS (irritable bowel syndrome) 07/09/2017   Eyelid dermatitis, allergic/contact 07/09/2017   Chronic sacroiliac joint pain (Right) 12/16/2016   DDD (degenerative disc disease), lumbar 07/18/2016   Family hx of colon cancer 07/18/2016    Past Surgical History:  Procedure Laterality Date   ABDOMINAL HYSTERECTOMY     2012 removed cervix h/o abnormal pap    CHOLECYSTECTOMY     2000   COLONOSCOPY     2018 with + polpys and GIB 2/2 polyp removal    COLONOSCOPY WITH PROPOFOL N/A 12/22/2019   Procedure: COLONOSCOPY WITH PROPOFOL;  Surgeon: Toney Reil, MD;  Location: [facility] ENDOSCOPY;  Service: Gastroenterology;  Laterality: N/A;   COLONOSCOPY WITH PROPOFOL N/A 12/23/2019   Procedure: COLONOSCOPY WITH PROPOFOL;  Surgeon: Toney Reil, MD;  Location: [facility] ENDOSCOPY;  Service: Gastroenterology;  Laterality: N/A;  Last name pronounced LEE-MA   ELBOW DEBRIDEMENT     ESOPHAGOGASTRODUODENOSCOPY (EGD) WITH PROPOFOL N/A 02/06/2018   Procedure: ESOPHAGOGASTRODUODENOSCOPY (EGD) WITH PROPOFOL with biopsies;  Surgeon: Toney Reil, MD;  Location: [facility] SUR[facility]ERY CNTR;  Service: Endoscopy;  Laterality: N/A;   ETHMOIDECTOMY N/A 09/28/2021   Procedure: TOTAL ETHMOIDECTOMY;  Surgeon: Bud Face, MD;  Location: [facility];  Service: ENT;  Laterality: N/A;   FOOT SURGERY     IMAGE GUIDED SINUS SURGERY N/A 09/28/2021   Procedure: IMAGE GUIDED SINUS SURGERY;  Surgeon: Bud Face, MD;  Location: [facility];  Service: ENT;  Laterality: N/A;  PLACED DISK ON OR CHARGE NURSE DESK 4-21  KP   MAXILLARY ANTROSTOMY Bilateral 09/28/2021   Procedure: MAXILLARY ANTROSTOMY WITHOUT TISSUE REMOVAL;  Surgeon: Bud Face, MD;   Location: [facility];  Service: ENT;  Laterality: Bilateral;   SPHENOIDECTOMY Bilateral 09/28/2021   Procedure: SPHENOIDOTOMY;  Surgeon: Bud Face, MD;  Location: [facility];  Service: ENT;  Laterality: Bilateral;    OB History   No obstetric history on file.      Home Medications    Prior to Admission medications   Medication Sig Start Date End Date Taking? Authorizing Provider  amLODipine (NORVASC) 10 MG tablet Take 1 tablet (10 mg) by mouth daily. 10/02/22   Erasmo Downer, MD  BUDESONIDE IN ADD (SQUEEZE 0.5ML TWICE=1ML DOSE) OF MEDICATION TO OF SALINE IN SALINE IRRIGATION BOTTLE; IRRIGATE SINUSES WITH THROUGH EACH NOSTRIL TWICE DAILY 08/29/22   [provider]  buPROPion (WELLBUTRIN XL) 300 MG 24 hr tablet Take 1 tablet (300 mg total) by mouth daily. 07/24/22   Erasmo Downer, MD  DULoxetine (CYMBALTA) 60 MG capsule Take 1 capsule (60 mg total) by mouth daily. 11/08/22   Erasmo Downer, MD  lansoprazole (PREVACID) 30 MG capsule Take 1 capsule (30 mg total) by mouth 2 (two) times daily before a meal. 09/05/22   Bacigalupo, Marzella Schlein, MD  Karlene Einstein 290  MCG CAPS capsule Take 1 capsule (290 mcg total) by mouth daily. 07/03/22   Erasmo Downer, MD  Multiple Vitamin (MULTIVITAMIN WITH MINERALS) TABS tablet Take 1 tablet by mouth daily.    [provider]  ondansetron (ZOFRAN-ODT) 8 MG disintegrating tablet Take 1 tablet (8 mg total) by mouth every 8 (eight) hours as needed for nausea or vomiting. 11/27/22   Jeweline Reif, DO  oxybutynin (DITROPAN-XL) 5 MG 24 hr tablet Take 1 tablet (5 mg total) by mouth at bedtime. 10/26/22   Erasmo Downer, MD  triamterene-hydrochlorothiazide (MAXZIDE-25) 37.5-25 MG tablet Take 1 tablet by mouth daily. 11/17/22   Bacigalupo, Marzella Schlein, MD    Family History Family History  Problem Relation Age of Onset   Hypertension Mother    Hyperlipidemia Mother    Glaucoma Mother     Diabetes Mother        type 2    COPD Father        emphysema   Alcohol abuse Father    Colon cancer Sister    Cancer Sister        rectal/colon cancer no h/o IBD   Colon polyps Sister    Asthma Sister    COPD Sister    Kidney disease Sister    Mental illness Sister        bipolar    Miscarriages / [place] Sister    Uterine cancer Sister    Alcohol abuse Son    Depression Son    Diabetes Son    Drug abuse Son    Mental illness Son        bipolar    Diabetes Maternal Grandmother    COPD Paternal Grandmother    Cancer Paternal Grandfather        FH colon cancer maternal aunts/uncles    Miscarriages / Stillbirths Sister    Arthritis Son    Arthritis Son    Breast cancer Neg Hx     Social History Social History   Tobacco Use   Smoking status: Former    Packs/day: 0.75    Years: 5.00    Additional pack years: 0.00    Total pack years: 3.75    Types: Cigarettes    Quit date: 07/06/1987    Years since quitting: 35.4   Smokeless tobacco: Never  Vaping Use   Vaping Use: Never used  Substance Use Topics   Alcohol use: Yes    Alcohol/week: 2.0 - 4.0 standard drinks of alcohol    Types: 2 - 4 Cans of beer per week    Comment: wine cooler   Drug use: Never     Allergies   Gluten meal, Lactose, Ranitidine, Amphetamine-dextroamphetamine, Lactose intolerance (gi), Reglan [metoclopramide], Topiramate er, Wheat, Soap, and Sulfa antibiotics   Review of Systems Review of Systems: negative unless otherwise stated in HPI.      Physical Exam Triage Vital Signs ED Triage Vitals  Enc Vitals Group     BP 11/27/22 1420 (!) 155/88     Pulse Rate 11/27/22 1420 85     Resp --      Temp 11/27/22 1420 99.4 F (37.4 C)     Temp Source 11/27/22 1420 Oral     SpO2 11/27/22 1420 95 %     Weight --      Height --      Head Circumference --      Peak Flow --      Pain Score 11/27/22 1419 8  Pain Loc --      Pain Edu? --      Excl. in GC? --    No data  found.  Updated Vital Signs BP (!) 155/88 (BP Location: Right Arm)   Pulse 85   Temp 99.4 F (37.4 C) (Oral)   SpO2 95%   Visual Acuity Right Eye Distance:   Left Eye Distance:   Bilateral Distance:    Right Eye Near:   Left Eye Near:    Bilateral Near:     Physical Exam GEN:     alert, ill appearing female in no distress    HENT:  mucus membranes moist, oropharyngeal without lesions or erythema, no tonsillar hypertrophy or exudates, clear nasal discharge EYES:   pupils equal and reactive, no scleral injection or discharge NECK: Good ROM, no meningismus   RESP:  no increased work of breathing, clear to auscultation bilaterally CVS:   regular rate and rhythm NEURO:  alert, oriented, speech normal, CN 2-12 grossly intact, no facial droop,  sensation grossly intact, strength 5/5 bilateral UE and LE, normal coordination, normal finger to nose Skin:   warm and dry, no rash on visible skin    UC Treatments / Results  Labs (all labs ordered are listed, but only abnormal results are displayed) Labs Reviewed  SARS CORONAVIRUS 2 BY RT PCR    EKG   Radiology No results found.  Procedures Procedures (including critical care time)  Medications Ordered in UC Medications  ondansetron (ZOFRAN) injection 4 mg (4 mg Intramuscular Given 11/27/22 1523)    Initial Impression / Assessment and Plan / UC Course  I have reviewed the triage vital signs and the nursing notes.  Pertinent labs & imaging results that were available during my care of the patient were reviewed by me and considered in my medical decision making (see chart for details).       Pt is a 57 y.o. female who presents for headache and respiratory symptoms. Ruchy  has an elevated temperature here, 99.4 F and is hypertensive. Satting 95% on room air. Overall pt is ill but non-toxic appearing, well hydrated, without respiratory distress. Pulmonary exam is unremarkable.  COVID testing obtained and was negative. Given  Zofran IM for nausea. Prescription zofran also sent to pharmacy. History consistent with viral respiratory illness. Discussed symptomatic treatment.  Explained lack of efficacy of antibiotics in viral disease.  Typical duration of symptoms discussed. Work note provided at [employer] request.  Pt has history of migraines and I suspect she is having another one likely exacerbated by viral illness. Declined pain control here. Given zofran as above.  Return and ED precautions given and voiced understanding. Discussed MDM, treatment plan and plan for follow-up with patient who agrees with plan.     Final Clinical Impressions(s) / UC Diagnoses   Final diagnoses:  Bad headache  Viral illness     Discharge Instructions      Your COVID test is negative. If your were prescribed medication, stop by the pharmacy to pick them up.  Follow up with your neurologist regarding your headache if not improving. Go to the ED if you headache suddenly worsens.    You can take Tylenol and/or Ibuprofen as needed for fever reduction and pain relief.    For cough: honey 1/2 to 1 teaspoon (you can dilute the honey in water or another fluid).  You can also use guaifenesin and dextromethorphan for cough. You can use a humidifier for chest congestion and cough.  If you don't have a humidifier, you can sit in the bathroom with the hot shower running.      For sore throat: try warm salt water gargles, Mucinex sore throat cough drops or cepacol lozenges, throat spray, warm tea or water with lemon/honey, popsicles or ice, or OTC cold relief medicine for throat discomfort. You can also purchase chloraseptic spray at the pharmacy or dollar store.   For congestion: take a daily anti-histamine like Zyrtec, Claritin, and a oral decongestant, such as pseudoephedrine.  You can also use Flonase 1-2 sprays in each nostril daily. Afrin is also a good option, if you do not have high blood pressure.    It is important to stay hydrated:  drink plenty of fluids (water, gatorade/powerade/pedialyte, juices, or teas) to keep your throat moisturized and help further relieve irritation/discomfort.    Return or go to the Emergency Department if symptoms worsen or do not improve in the next few days      ED Prescriptions     Medication Sig Dispense Auth. Provider   ondansetron (ZOFRAN-ODT) 8 MG disintegrating tablet Take 1 tablet (8 mg total) by mouth every 8 (eight) hours as needed for nausea or vomiting. 20 tablet Katha Cabal, DO      PDMP not reviewed this encounter.   Katha Cabal, DO 12/03/22 0115

## 2022-11-28 ENCOUNTER — Other Ambulatory Visit (HOSPITAL_COMMUNITY): Payer: Self-pay

## 2022-11-29 ENCOUNTER — Other Ambulatory Visit (HOSPITAL_COMMUNITY): Payer: Self-pay

## 2022-12-12 ENCOUNTER — Other Ambulatory Visit: Payer: Self-pay

## 2022-12-16 ENCOUNTER — Other Ambulatory Visit (HOSPITAL_COMMUNITY): Payer: Self-pay

## 2022-12-28 ENCOUNTER — Other Ambulatory Visit (HOSPITAL_COMMUNITY): Payer: Self-pay

## 2022-12-28 ENCOUNTER — Other Ambulatory Visit: Payer: Self-pay | Admitting: Family Medicine

## 2022-12-28 ENCOUNTER — Other Ambulatory Visit: Payer: Self-pay

## 2022-12-28 DIAGNOSIS — R110 Nausea: Secondary | ICD-10-CM | POA: Diagnosis not present

## 2022-12-28 DIAGNOSIS — G43719 Chronic migraine without aura, intractable, without status migrainosus: Secondary | ICD-10-CM | POA: Diagnosis not present

## 2022-12-28 DIAGNOSIS — K581 Irritable bowel syndrome with constipation: Secondary | ICD-10-CM

## 2022-12-28 DIAGNOSIS — F40298 Other specified phobia: Secondary | ICD-10-CM | POA: Diagnosis not present

## 2022-12-28 DIAGNOSIS — H53149 Visual discomfort, unspecified: Secondary | ICD-10-CM | POA: Diagnosis not present

## 2022-12-28 MED ORDER — METHYLPREDNISOLONE 4 MG PO TBPK
ORAL_TABLET | ORAL | 0 refills | Status: DC
  Filled 2022-12-28: qty 21, 6d supply, fill #0

## 2022-12-28 MED ORDER — LINZESS 290 MCG PO CAPS
290.0000 ug | ORAL_CAPSULE | Freq: Every day | ORAL | 0 refills | Status: DC
  Filled 2022-12-28: qty 90, 90d supply, fill #0

## 2022-12-28 MED ORDER — UBRELVY 50 MG PO TABS
50.0000 mg | ORAL_TABLET | Freq: Once | ORAL | 0 refills | Status: DC | PRN
  Filled 2022-12-28: qty 10, 30d supply, fill #0
  Filled 2023-01-02: qty 10, 5d supply, fill #0
  Filled 2023-01-15: qty 10, 30d supply, fill #0

## 2022-12-28 NOTE — Telephone Encounter (Signed)
Requested Prescriptions  Pending Prescriptions Disp Refills   LINZESS 290 MCG CAPS capsule 90 capsule 0    Sig: Take 1 capsule (290 mcg total) by mouth daily.     Gastroenterology: Irritable Bowel Syndrome Passed - 12/28/2022  9:29 AM      Passed - Valid encounter within last 12 months    Recent Outpatient Visits           2 months ago Encounter for annual physical exam   [place] [facility] [place], Marzella Schlein, MD   6 months ago Urethra disorder   [place] [facility] [place], Marzella Schlein, MD   8 months ago Benign essential hypertension   [place] [facility] [place], Marzella Schlein, MD   1 year ago Encounter for annual physical exam   [place] [facility] [place], Marzella Schlein, MD   1 year ago Benign essential hypertension   [place] [facility] [place], Marzella Schlein, MD       Future Appointments             In 4 months Bacigalupo, Marzella Schlein, MD [facility]

## 2022-12-29 ENCOUNTER — Encounter: Payer: Self-pay | Admitting: Gastroenterology

## 2022-12-29 ENCOUNTER — Other Ambulatory Visit: Payer: Self-pay

## 2023-01-01 ENCOUNTER — Ambulatory Visit: Payer: 59 | Admitting: Anesthesiology

## 2023-01-01 ENCOUNTER — Encounter: Payer: Self-pay | Admitting: Gastroenterology

## 2023-01-01 ENCOUNTER — Ambulatory Visit
Admission: RE | Admit: 2023-01-01 | Discharge: 2023-01-01 | Disposition: A | Discharge status: Home / Self Care | Payer: 59 | Attending: Gastroenterology | Admitting: Gastroenterology

## 2023-01-01 ENCOUNTER — Other Ambulatory Visit: Payer: Self-pay

## 2023-01-01 ENCOUNTER — Encounter
Admission: RE | Disposition: A | Discharge status: Home / Self Care | Payer: Self-pay | Source: Home / Self Care | Attending: Gastroenterology

## 2023-01-01 DIAGNOSIS — Z1211 Encounter for screening for malignant neoplasm of colon: Secondary | ICD-10-CM | POA: Insufficient documentation

## 2023-01-01 DIAGNOSIS — Z87891 Personal history of nicotine dependence: Secondary | ICD-10-CM | POA: Insufficient documentation

## 2023-01-01 DIAGNOSIS — Z79899 Other long term (current) drug therapy: Secondary | ICD-10-CM | POA: Insufficient documentation

## 2023-01-01 DIAGNOSIS — Z86010 Personal history of colonic polyps: Secondary | ICD-10-CM | POA: Diagnosis not present

## 2023-01-01 DIAGNOSIS — G43909 Migraine, unspecified, not intractable, without status migrainosus: Secondary | ICD-10-CM | POA: Insufficient documentation

## 2023-01-01 DIAGNOSIS — I10 Essential (primary) hypertension: Secondary | ICD-10-CM | POA: Insufficient documentation

## 2023-01-01 DIAGNOSIS — D123 Benign neoplasm of transverse colon: Secondary | ICD-10-CM | POA: Insufficient documentation

## 2023-01-01 DIAGNOSIS — K635 Polyp of colon: Secondary | ICD-10-CM

## 2023-01-01 DIAGNOSIS — F32A Depression, unspecified: Secondary | ICD-10-CM | POA: Diagnosis not present

## 2023-01-01 DIAGNOSIS — K219 Gastro-esophageal reflux disease without esophagitis: Secondary | ICD-10-CM | POA: Insufficient documentation

## 2023-01-01 DIAGNOSIS — Z8249 Family history of ischemic heart disease and other diseases of the circulatory system: Secondary | ICD-10-CM | POA: Diagnosis not present

## 2023-01-01 HISTORY — PX: POLYPECTOMY: SHX5525

## 2023-01-01 HISTORY — PX: COLONOSCOPY WITH PROPOFOL: SHX5780

## 2023-01-01 SURGERY — COLONOSCOPY WITH PROPOFOL
Anesthesia: General

## 2023-01-01 MED ORDER — PROPOFOL 10 MG/ML IV BOLUS
INTRAVENOUS | Status: DC | PRN
Start: 2023-01-01 — End: 2023-01-01
  Administered 2023-01-01: 20 mg via INTRAVENOUS
  Administered 2023-01-01: 70 mg via INTRAVENOUS
  Administered 2023-01-01: 10 mg via INTRAVENOUS

## 2023-01-01 MED ORDER — PROPOFOL 500 MG/50ML IV EMUL
INTRAVENOUS | Status: DC | PRN
  Administered 2023-01-01: 165 ug/kg/min via INTRAVENOUS

## 2023-01-01 MED ORDER — SODIUM CHLORIDE 0.9 % IV SOLN: INTRAVENOUS | Status: DC

## 2023-01-01 MED ORDER — MIDAZOLAM HCL 2 MG/2ML IJ SOLN
INTRAMUSCULAR | Status: DC | PRN
  Administered 2023-01-01: 2 mg via INTRAVENOUS

## 2023-01-01 MED ORDER — MIDAZOLAM HCL 2 MG/2ML IJ SOLN
INTRAMUSCULAR | Status: AC
  Filled 2023-01-01: qty 2

## 2023-01-01 MED ORDER — LIDOCAINE HCL (CARDIAC) PF 100 MG/5ML IV SOSY
PREFILLED_SYRINGE | INTRAVENOUS | Status: DC | PRN
  Administered 2023-01-01: 100 mg via INTRAVENOUS

## 2023-01-01 NOTE — Anesthesia Preprocedure Evaluation (Signed)
Anesthesia Evaluation  Patient identified by MRN, date of birth, ID band Patient awake    Reviewed: Allergy & Precautions, NPO status , Patient's Chart, lab work & pertinent test results  Airway Mallampati: II  TM Distance: >3 FB Neck ROM: full    Dental  (+) Teeth Intact   Pulmonary neg pulmonary ROS, Patient abstained from smoking., former smoker   Pulmonary exam normal breath sounds clear to auscultation       Cardiovascular Exercise Tolerance: Good hypertension, Pt. on medications negative cardio ROS Normal cardiovascular exam Rhythm:Regular Rate:Normal     Neuro/Psych  Headaches   Depression    negative neurological ROS  negative psych ROS   GI/Hepatic negative GI ROS, Neg liver ROS,GERD  Medicated,,  Endo/Other  negative endocrine ROS    Renal/GU negative Renal ROS  negative genitourinary   Musculoskeletal   Abdominal Normal abdominal exam  (+)   Peds negative pediatric ROS (+)  Hematology negative hematology ROS (+)   Anesthesia Other Findings Past Medical History: No date: Fatty liver No date: Fibromyalgia No date: GERD (gastroesophageal reflux disease) No date: Hemorrhoids No date: Hot flashes No date: Hypertension No date: IBS (irritable bowel syndrome) 10/25/2016: LUQ abdominal pain No date: Migraines No date: Miscarriage     Comment:  x 2; 4 live births  No date: Multilevel degenerative disc disease No date: Neuroma No date: Vocal cord dysfunction  Past Surgical History: No date: ABDOMINAL HYSTERECTOMY     Comment:  2012 removed cervix h/o abnormal pap  No date: CHOLECYSTECTOMY     Comment:  2000 No date: COLONOSCOPY     Comment:  2018 with + polpys and GIB 2/2 polyp removal  12/22/2019: COLONOSCOPY WITH PROPOFOL; N/A     Comment:  Procedure: COLONOSCOPY WITH PROPOFOL;  Surgeon: Toney Reil, MD;  Location: [facility] ENDOSCOPY;  Service:               Gastroenterology;   Laterality: N/A; 12/23/2019: COLONOSCOPY WITH PROPOFOL; N/A     Comment:  Procedure: COLONOSCOPY WITH PROPOFOL;  Surgeon: Toney Reil, MD;  Location: [facility] ENDOSCOPY;  Service:               Gastroenterology;  Laterality: N/A;  Last name pronounced              LEE-MA No date: ELBOW DEBRIDEMENT 02/06/2018: ESOPHAGOGASTRODUODENOSCOPY (EGD) WITH PROPOFOL; N/A     Comment:  Procedure: ESOPHAGOGASTRODUODENOSCOPY (EGD) WITH               PROPOFOL with biopsies;  Surgeon: Toney Reil,               MD;  Location: [facility];  Service: Endoscopy;               Laterality: N/A; 09/28/2021: ETHMOIDECTOMY; N/A     Comment:  Procedure: TOTAL ETHMOIDECTOMY;  Surgeon: Bud Face, MD;  Location: [facility];  Service:               ENT;  Laterality: N/A; No date: FOOT SURGERY 09/28/2021: IMAGE GUIDED SINUS SURGERY; N/A     Comment:  Procedure: IMAGE GUIDED SINUS SURGERY;  Surgeon: Andee Poles,  Roney Mans, MD;  Location: [facility];  Service:               ENT;  Laterality: N/A;  PLACED DISK ON OR CHARGE NURSE               DESK 4-21  KP 09/28/2021: MAXILLARY ANTROSTOMY; Bilateral     Comment:  Procedure: MAXILLARY ANTROSTOMY WITHOUT TISSUE REMOVAL;               Surgeon: Bud Face, MD;  Location: [facility];  Service: ENT;  Laterality: Bilateral; 09/28/2021: SPHENOIDECTOMY; Bilateral     Comment:  Procedure: SPHENOIDOTOMY;  Surgeon: Bud Face,               MD;  Location: [facility];  Service: ENT;                Laterality: Bilateral;  BMI    Body Mass Index: 31.83 kg/m      Reproductive/Obstetrics negative OB ROS                             Anesthesia Physical Anesthesia Plan  ASA: 2  Anesthesia Plan: General   Post-op Pain Management:    Induction: Intravenous  PONV Risk Score and Plan: Propofol infusion and TIVA  Airway Management  Planned: Natural Airway  Additional Equipment:   Intra-op Plan:   Post-operative Plan:   Informed Consent: I have reviewed the patients History and Physical, chart, labs and discussed the procedure including the risks, benefits and alternatives for the proposed anesthesia with the patient or authorized representative who has indicated his/her understanding and acceptance.     Dental Advisory Given  Plan Discussed with: CRNA and Surgeon  Anesthesia Plan Comments:        Anesthesia Quick Evaluation

## 2023-01-01 NOTE — Anesthesia Postprocedure Evaluation (Signed)
Anesthesia Post Note  Patient: Elizabeth Bowers  Procedure(s) Performed: COLONOSCOPY WITH PROPOFOL POLYPECTOMY  Patient location during evaluation: PACU Anesthesia Type: General Level of consciousness: awake and awake and alert Pain management: satisfactory to patient Vital Signs Assessment: post-procedure vital signs reviewed and stable Respiratory status: spontaneous breathing and respiratory function stable Cardiovascular status: blood pressure returned to baseline Anesthetic complications: no   No notable events documented.   Last Vitals:  Vitals:   01/01/23 0905 01/01/23 0915  BP: 108/75 123/77  Pulse: 70 67  Resp: 12 13  Temp:    SpO2: 100% 98%    Last Pain:  Vitals:   01/01/23 0915  TempSrc:   PainSc: 0-No pain                 VAN STAVEREN,Nykeria Mealing

## 2023-01-01 NOTE — Transfer of Care (Signed)
Immediate Anesthesia Transfer of Care Note  Patient: Rue Lodell Magnin  Procedure(s) Performed: COLONOSCOPY WITH PROPOFOL POLYPECTOMY  Patient Location: Endoscopy Unit  Anesthesia Type:General  Level of Consciousness: drowsy and patient cooperative  Airway & Oxygen Therapy: Patient Spontanous Breathing and Patient connected to face mask oxygen  Post-op Assessment: Report given to RN and Post -op Vital signs reviewed and stable  Post vital signs: Reviewed and stable  Last Vitals:  Vitals Value Taken Time  BP 123/77 01/01/23 0918  Temp 36.1 C 01/01/23 0855  Pulse 65 01/01/23 0917  Resp 14 01/01/23 0917  SpO2 98 % 01/01/23 0917  Vitals shown include unfiled device data.  Last Pain:  Vitals:   01/01/23 0915  TempSrc:   PainSc: 0-No pain         Complications: No notable events documented.

## 2023-01-01 NOTE — H&P (Signed)
Arlyss Repress, MD [address]  [address]  [place], [place] [address]  Main: [phone]  Fax: [phone] Pager: [phone]  Primary Care Physician:  Erasmo Downer, MD Primary Gastroenterologist:  Dr. Arlyss Repress  Pre-Procedure History & Physical: HPI:  Elizabeth Bowers is a 57 y.o. female is here for an colonoscopy.   Past Medical History:  Diagnosis Date   Fatty liver    Fibromyalgia    GERD (gastroesophageal reflux disease)    Hemorrhoids    Hot flashes    Hypertension    IBS (irritable bowel syndrome)    LUQ abdominal pain 10/25/2016   Migraines    Miscarriage    x 2; 4 live births    Multilevel degenerative disc disease    Neuroma    Vocal cord dysfunction     Past Surgical History:  Procedure Laterality Date   ABDOMINAL HYSTERECTOMY     2012 removed cervix h/o abnormal pap    CHOLECYSTECTOMY     2000   COLONOSCOPY     2018 with + polpys and GIB 2/2 polyp removal    COLONOSCOPY WITH PROPOFOL N/A 12/22/2019   Procedure: COLONOSCOPY WITH PROPOFOL;  Surgeon: Toney Reil, MD;  Location: [facility] ENDOSCOPY;  Service: Gastroenterology;  Laterality: N/A;   COLONOSCOPY WITH PROPOFOL N/A 12/23/2019   Procedure: COLONOSCOPY WITH PROPOFOL;  Surgeon: Toney Reil, MD;  Location: [facility] END[facility];  Service: Gastroenterology;  Laterality: N/A;  Last name pronounced LEE-MA   ELBOW DEBRIDEMENT     ESOPHAGOGASTRODUODENOSCOPY (EGD) WITH PROPOFOL N/A 02/06/2018   Procedure: ESOPHAGOGASTRODUODENOSCOPY (EGD) WITH PROPOFOL with biopsies;  Surgeon: Toney Reil, MD;  Location: [facility];  Service: Endoscopy;  Laterality: N/A;   ETHMOIDECTOMY N/A 09/28/2021   Procedure: TOTAL ETHMOIDECTOMY;  Surgeon: Bud Face, MD;  Location: [facility];  Service: ENT;  Laterality: N/A;   FOOT SURGERY     IMAGE GUIDED SINUS SURGERY N/A 09/28/2021   Procedure: IMAGE GUIDED SINUS SURGERY;  Surgeon: Bud Face, MD;  Location: [facility];  Service: ENT;  Laterality: N/A;  PLACED DISK ON OR CHARGE NURSE DESK 4-21  KP   MAXILLARY ANTROSTOMY Bilateral 09/28/2021   Procedure: MAXILLARY ANTROSTOMY WITHOUT TISSUE REMOVAL;  Surgeon: Bud Face, MD;  Location: [facility];  Service: ENT;  Laterality: Bilateral;   SPHENOIDECTOMY Bilateral 09/28/2021   Procedure: SPHENOIDOTOMY;  Surgeon: Bud Face, MD;  Location: [facility];  Service: ENT;  Laterality: Bilateral;    Prior to Admission medications   Medication Sig Start Date End Date Taking? Authorizing Provider  amLODipine (NORVASC) 10 MG tablet Take 1 tablet (10 mg) by mouth daily. 10/02/22  Yes Bacigalupo, Marzella Schlein, MD  buPROPion (WELLBUTRIN XL) 300 MG 24 hr tablet Take 1 tablet (300 mg total) by mouth daily. 07/24/22  Yes Bacigalupo, Marzella Schlein, MD  DULoxetine (CYMBALTA) 60 MG capsule Take 1 capsule (60 mg total) by mouth daily. 11/08/22  Yes Bacigalupo, Marzella Schlein, MD  LINZESS 290 MCG CAPS capsule Take 1 capsule (290 mcg total) by mouth daily. 12/28/22  Yes Bacigalupo, Marzella Schlein, MD  methylPREDNISolone (MEDROL DOSEPAK) 4 MG TBPK tablet Follow package directions. 12/28/22  Yes   Multiple Vitamin (MULTIVITAMIN WITH MINERALS) TABS tablet Take 1 tablet by mouth daily.   Yes [provider]  oxybutynin (DITROPAN-XL) 5 MG 24 hr tablet Take 1 tablet (5 mg total) by mouth at bedtime. 10/26/22  Yes Bacigalupo, Marzella Schlein, MD  triamterene-hydrochlorothiazide (MAXZIDE-25) 37.5-25 MG tablet Take  1 tablet by mouth daily. 11/17/22  Yes Bacigalupo, Marzella Schlein, MD  BUDESONIDE IN ADD (SQUEEZE 0.5ML TWICE=1ML DOSE) OF MEDICATION TO OF SALINE IN SALINE IRRIGATION BOTTLE; IRRIGATE SINUSES WITH THROUGH EACH NOSTRIL TWICE DAILY 08/29/22   [provider]  lansoprazole (PREVACID) 30 MG capsule Take 1 capsule (30 mg total) by mouth 2 (two) times daily before a meal. 09/05/22   Bacigalupo, Marzella Schlein, MD  ondansetron (ZOFRAN-ODT) 8 MG disintegrating tablet Take 1  tablet (8 mg total) by mouth every 8 (eight) hours as needed for nausea or vomiting. 11/27/22   Brimage, Vondra, DO  Ubrogepant (UBRELVY) 50 MG TABS Take 50 mg by mouth once as needed (Take 50 mg as a single dose; if symptoms persist or return, may repeat dose after =2 hours. Maximum: 200 mg per 24 hours) for up to 1 dose 12/28/22       Allergies as of 10/10/2022 - Review Complete 08/23/2022  Allergen Reaction Noted   Gluten meal  04/22/2019   Lactose  04/22/2019   Ranitidine Diarrhea and Other (See Comments) 08/30/2016   Amphetamine-dextroamphetamine Other (See Comments) 05/10/2016   Lactose intolerance (gi)  02/05/2018   Reglan [metoclopramide] Other (See Comments) 12/15/2016   Topiramate er Other (See Comments) 01/16/2018   Wheat  02/05/2018   Soap Rash 02/20/2017   Sulfa antibiotics Anxiety 12/15/2016    Family History  Problem Relation Age of Onset   Hypertension Mother    Hyperlipidemia Mother    Glaucoma Mother    Diabetes Mother        type 2    COPD Father        emphysema   Alcohol abuse Father    Colon cancer Sister    Cancer Sister        rectal/colon cancer no h/o IBD   Colon polyps Sister    Asthma Sister    COPD Sister    Kidney disease Sister    Mental illness Sister        bipolar    Miscarriages / [place] Sister    Uterine cancer Sister    Alcohol abuse Son    Depression Son    Diabetes Son    Drug abuse Son    Mental illness Son        bipolar    Diabetes Maternal Grandmother    COPD Paternal Grandmother    Cancer Paternal Grandfather        FH colon cancer maternal aunts/uncles    Miscarriages / Stillbirths Sister    Arthritis Son    Arthritis Son    Breast cancer Neg Hx     Social History   Socioeconomic History   Marital status: Married    Spouse name: Marcy Salvo   Number of children: 6   Years of education: 14   Highest education level: Associate degree: academic program  Occupational History   Occupation: Medical laboratory scientific officer: [employer]  Tobacco Use   Smoking status: Former    Current packs/day: 0.00    Average packs/day: 0.8 packs/day for 5.0 years (3.8 ttl pk-yrs)    Types: Cigarettes    Start date: 07/05/1982    Quit date: 07/06/1987    Years since quitting: 35.5   Smokeless tobacco: Never  Vaping Use   Vaping status: Never Used  Substance and Sexual Activity   Alcohol use: Yes    Alcohol/week: 2.0 - 4.0 standard drinks of alcohol    Types: 2 -  4 Cans of beer per week    Comment: wine cooler   Drug use: Never   Sexual activity: Not on file  Other Topics Concern   Not on file  Social History Narrative   Not on file   Social Determinants of Health   Financial Resource Strain: Low Risk  (09/28/2017)   Overall Financial Resource Strain (CARDIA)    Difficulty of Paying Living Expenses: Not hard at all  Food Insecurity: No Food Insecurity (09/28/2017)   Hunger Vital Sign    Worried About Running Out of Food in the Last Year: Never true    Ran Out of Food in the Last Year: Never true  Transportation Needs: No Transportation Needs (09/28/2017)   PRAPARE - Administrator, Civil Service (Medical): No    Lack of Transportation (Non-Medical): No  Physical Activity: Inactive (09/28/2017)   Exercise Vital Sign    Days of Exercise per Week: 0 days    Minutes of Exercise per Session: 0 min  Stress: Not on file  Social Connections: Not on file  Intimate Partner Violence: Not on file    Review of Systems: See HPI, otherwise negative ROS  Physical Exam: BP 133/81   Pulse 67   Temp 97.6 F (36.4 C) (Temporal)   Resp 18   Ht 5\' 2"  (1.575 m)   Wt 78.9 kg   SpO2 97%   BMI 31.83 kg/m  General:   Alert,  pleasant and cooperative in NAD Head:  Normocephalic and atraumatic. Neck:  Supple; no masses or thyromegaly. Lungs:  Clear throughout to auscultation.    Heart:  Regular rate and rhythm. Abdomen:  Soft, nontender and nondistended. Normal bowel sounds, without guarding, and without  rebound.   Neurologic:  Alert and  oriented x4;  grossly normal neurologically.  Impression/Plan: Elizabeth Bowers is here for an colonoscopy to be performed for h/o colon adenoma  Risks, benefits, limitations, and alternatives regarding  colonoscopy have been reviewed with the patient.  Questions have been answered.  All parties agreeable.   Lannette Donath, MD  01/01/2023, 7:44 AM

## 2023-01-01 NOTE — Anesthesia Procedure Notes (Signed)
Procedure Name: General with mask airway Date/Time: 01/01/2023 8:34 AM  Performed by: Mohammed Kindle, CRNAPre-anesthesia Checklist: Patient identified, Emergency Drugs available, Suction available and Patient being monitored Patient Re-evaluated:Patient Re-evaluated prior to induction Oxygen Delivery Method: Simple face mask Induction Type: IV induction Placement Confirmation: positive ETCO2, CO2 detector and breath sounds checked- equal and bilateral Dental Injury: Teeth and Oropharynx as per pre-operative assessment

## 2023-01-01 NOTE — Op Note (Signed)
[facility] Gastroenterology Patient Name: Elizabeth Bowers Procedure Date: 01/01/2023 8:17 AM MRN: [id number] Account #: [id number] Date of Birth: Nov 25, 1965 Admit Type: Outpatient Age: 57 Room: [facility] ENDO ROOM 4 Gender: Female Note Status: Finalized Instrument Name: Nelda Marseille [id number] Procedure:             Colonoscopy Indications:           Surveillance: Personal history of adenomatous polyps                         on last colonoscopy 3 years ago, Surveillance: History                         of adenomatous polyps, inadequate prep on last exam                         (<60yr), Last colonoscopy: July 2021 Providers:             Toney Reil MD, MD Referring MD:          Marzella Schlein. Bacigalupo (Referring MD) Medicines:             General Anesthesia Complications:         No immediate complications. Estimated blood loss: None. Procedure:             Pre-Anesthesia Assessment:                        - Prior to the procedure, a History and Physical was                         performed, and patient medications and allergies were                         reviewed. The patient is competent. The risks and                         benefits of the procedure and the sedation options and                         risks were discussed with the patient. All questions                         were answered and informed consent was obtained.                         Patient identification and proposed procedure were                         verified by the physician, the nurse, the                         anesthesiologist, the anesthetist and the technician                         in the pre-procedure area in the procedure room in the                         endoscopy suite. Mental Status Examination: alert and  oriented. Airway Examination: normal oropharyngeal                         airway and neck mobility. Respiratory Examination:                         clear  to auscultation. CV Examination: normal.                         Prophylactic Antibiotics: The patient does not require                         prophylactic antibiotics. Prior Anticoagulants: The                         patient has taken no anticoagulant or antiplatelet                         agents. ASA Grade Assessment: II - A patient with mild                         systemic disease. After reviewing the risks and                         benefits, the patient was deemed in satisfactory                         condition to undergo the procedure. The anesthesia                         plan was to use general anesthesia. Immediately prior                         to administration of medications, the patient was                         re-assessed for adequacy to receive sedatives. The                         heart rate, respiratory rate, oxygen saturations,                         blood pressure, adequacy of pulmonary ventilation, and                         response to care were monitored throughout the                         procedure. The physical status of the patient was                         re-assessed after the procedure.                        After obtaining informed consent, the colonoscope was                         passed under direct vision. Throughout the procedure,  the patient's blood pressure, pulse, and oxygen                         saturations were monitored continuously. The                         Colonoscope was introduced through the anus and                         advanced to the the cecum, identified by appendiceal                         orifice and ileocecal valve. The colonoscopy was                         performed with moderate difficulty due to significant                         looping and the patient's body habitus. The patient                         tolerated the procedure well. The quality of the bowel                          preparation was evaluated using the BBPS [facility] Bowel                         Preparation Scale) with scores of: Right Colon = 3,                         Transverse Colon = 3 and Left Colon = 3 (entire mucosa                         seen well with no residual staining, small fragments                         of stool or opaque liquid). The total BBPS score                         equals 9. The ileocecal valve, appendiceal orifice,                         and rectum were photographed. Findings:      The perianal and digital rectal examinations were normal. Pertinent       negatives include normal sphincter tone and no palpable rectal lesions.      Two sessile polyps were found in the transverse colon. The polyps were 4       to 7 mm in size. These polyps were removed with a cold snare. Resection       and retrieval were complete.      The retroflexed view of the distal rectum and anal verge was normal and       showed no anal or rectal abnormalities. Impression:            - Two 4 to 7 mm polyps in the transverse colon,  removed with a cold snare. Resected and retrieved.                        - The distal rectum and anal verge are normal on                         retroflexion view. Recommendation:        - Discharge patient to home (with escort).                        - Resume previous diet today.                        - Continue present medications.                        - Await pathology results.                        - Repeat colonoscopy in 5 years for surveillance based                         on pathology results. Procedure Code(s):     --- Professional ---                        [phone], Colonoscopy, flexible; with removal of                         tumor(s), polyp(s), or other lesion(s) by snare                         technique Diagnosis Code(s):     --- Professional ---                        D12.3, Benign neoplasm of transverse colon (hepatic                          flexure or splenic flexure)                        Z86.010, Personal history of colonic polyps CPT copyright 2022 American [employer]. All rights reserved. The codes documented in this report are preliminary and upon coder review may  be revised to meet current compliance requirements. Dr. Libby Maw Toney Reil MD, MD 01/01/2023 8:55:01 AM This report has been signed electronically. Number of Addenda: 0 Note Initiated On: 01/01/2023 8:17 AM Scope Withdrawal Time: 0 hours 14 minutes 22 seconds  Total Procedure Duration: 0 hours 20 minutes 36 seconds  Estimated Blood Loss:  Estimated blood loss: none.      [facility]

## 2023-01-02 ENCOUNTER — Emergency Department: Payer: 59

## 2023-01-02 ENCOUNTER — Other Ambulatory Visit: Payer: Self-pay

## 2023-01-02 ENCOUNTER — Emergency Department
Admission: EM | Admit: 2023-01-02 | Discharge: 2023-01-02 | Disposition: A | Discharge status: Home / Self Care | Payer: 59 | Attending: Emergency Medicine | Admitting: Emergency Medicine

## 2023-01-02 ENCOUNTER — Encounter: Payer: Self-pay | Admitting: Gastroenterology

## 2023-01-02 DIAGNOSIS — G43909 Migraine, unspecified, not intractable, without status migrainosus: Secondary | ICD-10-CM

## 2023-01-02 DIAGNOSIS — R519 Headache, unspecified: Secondary | ICD-10-CM | POA: Diagnosis present

## 2023-01-02 DIAGNOSIS — E041 Nontoxic single thyroid nodule: Secondary | ICD-10-CM | POA: Diagnosis not present

## 2023-01-02 DIAGNOSIS — H532 Diplopia: Secondary | ICD-10-CM | POA: Diagnosis not present

## 2023-01-02 LAB — CBC
HCT: 47.6 % — ABNORMAL HIGH (ref 36.0–46.0)
Hemoglobin: 16.5 g/dL — ABNORMAL HIGH (ref 12.0–15.0)
MCH: 30.8 pg (ref 26.0–34.0)
MCHC: 34.7 g/dL (ref 30.0–36.0)
MCV: 88.8 fL (ref 80.0–100.0)
Platelets: 270 10*3/uL (ref 150–400)
RBC: 5.36 MIL/uL — ABNORMAL HIGH (ref 3.87–5.11)
RDW: 11.9 % (ref 11.5–15.5)
WBC: 11.2 10*3/uL — ABNORMAL HIGH (ref 4.0–10.5)
nRBC: 0 % (ref 0.0–0.2)

## 2023-01-02 LAB — BASIC METABOLIC PANEL
Anion gap: 12 (ref 5–15)
BUN: 18 mg/dL (ref 6–20)
CO2: 25 mmol/L (ref 22–32)
Calcium: 9.2 mg/dL (ref 8.9–10.3)
Chloride: 103 mmol/L (ref 98–111)
Creatinine, Ser: 0.94 mg/dL (ref 0.44–1.00)
GFR, Estimated: >60 mL/min (ref >60)
Glucose, Bld: 92 mg/dL (ref 70–99)
Potassium: 3.4 mmol/L — ABNORMAL LOW (ref 3.5–5.1)
Sodium: 140 mmol/L (ref 135–145)

## 2023-01-02 MED ORDER — KETOROLAC TROMETHAMINE 15 MG/ML IJ SOLN
15.0000 mg | Freq: Once | INTRAMUSCULAR | Status: AC
  Administered 2023-01-02: 15 mg via INTRAVENOUS
  Filled 2023-01-02: qty 1

## 2023-01-02 MED ORDER — SODIUM CHLORIDE 0.9 % IV BOLUS
1000.0000 mL | Freq: Once | INTRAVENOUS | Status: AC
  Administered 2023-01-02: 1000 mL via INTRAVENOUS

## 2023-01-02 MED ORDER — DIPHENHYDRAMINE HCL 50 MG/ML IJ SOLN
25.0000 mg | Freq: Once | INTRAMUSCULAR | Status: AC
  Administered 2023-01-02: 25 mg via INTRAVENOUS
  Filled 2023-01-02: qty 1

## 2023-01-02 MED ORDER — PROCHLORPERAZINE EDISYLATE 10 MG/2ML IJ SOLN
10.0000 mg | Freq: Once | INTRAMUSCULAR | Status: AC
  Administered 2023-01-02: 10 mg via INTRAVENOUS
  Filled 2023-01-02: qty 2

## 2023-01-02 MED ORDER — VALPROATE SODIUM 100 MG/ML IV SOLN
500.0000 mg | Freq: Once | INTRAVENOUS | Status: AC
  Administered 2023-01-02: 500 mg via INTRAVENOUS
  Filled 2023-01-02: qty 5

## 2023-01-02 MED ORDER — DEXAMETHASONE SODIUM PHOSPHATE 10 MG/ML IJ SOLN
10.0000 mg | Freq: Once | INTRAMUSCULAR | Status: AC
  Administered 2023-01-02: 10 mg via INTRAVENOUS
  Filled 2023-01-02: qty 1

## 2023-01-02 MED ORDER — IOHEXOL 350 MG/ML SOLN
75.0000 mL | Freq: Once | INTRAVENOUS | Status: AC | PRN
  Administered 2023-01-02: 75 mL via INTRAVENOUS

## 2023-01-02 NOTE — ED Provider Notes (Signed)
[facility] Provider Note    Event Date/Time   First MD Initiated Contact with Patient 01/02/23 1423     (approximate)   History   Migraine   HPI  Elizabeth Bowers is a 57 y.o. female past medical history significant for migraine headache who presents to the emergency department with a headache.  States that she has been having an ongoing pressure sensation and migraine headache for the past 1 month.  Evaluated by neurology this past Thursday and prescribed a migraine medication however is requiring preauthorization and has yet to receive any medication for her migraine headache.  Has not received any IV medications for her migraine.  States that it is associated with some dizziness and she has been having double vision.  Believes that this has been present with prior migraine headaches but she gets infrequent migraine headaches.  Denies any falls or trauma.  Denies any extremity numbness or weakness.  Feels like her vision is different with blurry vision and that this has been ongoing for the past 1 month.  Not on anticoagulation.     Physical Exam   Triage Vital Signs: ED Triage Vitals  Encounter Vitals Group     BP 01/02/23 1419 (!) 143/83     Systolic BP Percentile --      Diastolic BP Percentile --      Pulse Rate 01/02/23 1419 (!) 59     Resp 01/02/23 1419 18     Temp 01/02/23 1419 99 F (37.2 C)     Temp Source 01/02/23 1419 Oral     SpO2 01/02/23 1419 96 %     Weight --      Height --      Head Circumference --      Peak Flow --      Pain Score 01/02/23 1417 8     Pain Loc --      Pain Education --      Exclude from Growth Chart --     Most recent vital signs: Vitals:   01/02/23 1530 01/02/23 1600  BP: 136/67 (!) 136/58  Pulse: 65 61  Resp:  17  Temp:  98.9 F (37.2 C)  SpO2: 98% 98%    Physical Exam Constitutional:      Appearance: She is well-developed.     Comments: Laying in bed with an eye mask on, appears uncomfortable,  tearful  HENT:     Head: Atraumatic.  Eyes:     Conjunctiva/sclera: Conjunctivae normal.  Cardiovascular:     Rate and Rhythm: Regular rhythm.  Pulmonary:     Effort: No respiratory distress.  Abdominal:     General: There is no distension.  Musculoskeletal:        General: Normal range of motion.     Cervical back: Normal range of motion.  Skin:    General: Skin is warm.  Neurological:     Mental Status: She is alert. Mental status is at baseline.     GCS: GCS eye subscore is 4. GCS verbal subscore is 5. GCS motor subscore is 6.     Cranial Nerves: Cranial nerves 2-12 are intact.     Sensory: Sensation is intact.     Motor: Motor function is intact.     Coordination: Coordination is intact.     Gait: Gait is intact.     Comments: No nystagmus.  No direct or consensual photophobia.     IMPRESSION / MDM / ASSESSMENT AND  PLAN / ED COURSE  I reviewed the triage vital signs and the nursing notes.  Differential diagnosis including migraine headache, cluster headache, vertebral artery dissection, mass, peripheral vertigo  RADIOLOGY I independently reviewed imaging, my interpretation of imaging: CTA head and neck -CT with no signs of intracranial hemorrhage or infarction.  No obvious large vessel cutoff for dissection.  Read as no acute findings.  LABS (all labs ordered are listed, but only abnormal results are displayed) Labs interpreted as -    Labs Reviewed  CBC - Abnormal; Notable for the following components:      Result Value   WBC 11.2 (*)    RBC 5.36 (*)    Hemoglobin 16.5 (*)    HCT 47.6 (*)    All other components within normal limits  BASIC METABOLIC PANEL - Abnormal; Notable for the following components:   Potassium 3.4 (*)    All other components within normal limits     MDM  Patient was given IV medication for her to treat her migraine headache.  CTA with no signs of intracranial hemorrhage or infarction.  No signs of dissection or large vessel  occlusion.  Lab work overall reassuring.  On reevaluation continued to have ongoing headaches was given a dose of Decadron and Depacon.  Able to complete a neurologic exam no longer with dizziness or double vision.  Ambulating in the room without any difficulties.  No nystagmus and pupils are equal and reactive.  Reevaluation patient states that she is feeling much better, able to go to sleep.  Discussed close follow-up with her neurologist and primary care provider.  Given return precautions for any worsening symptoms.  Clinical picture is not consistent with a meningitis, no altered mental status and no meningismus on exam.  Low suspicion for cerebral venous thrombosis given resolution of her headache and no findings on CTA.     PROCEDURES:  Critical Care performed: No  Procedures  Patient's presentation is most consistent with acute presentation with potential threat to life or bodily function.   MEDICATIONS ORDERED IN ED: Medications  valproate (DEPACON) 500 mg in dextrose 5 % 50 mL IVPB (500 mg Intravenous New Bag/Given 01/02/23 1753)  sodium chloride 0.9 % bolus 1,000 mL (0 mLs Intravenous Stopped 01/02/23 1659)  ketorolac (TORADOL) 15 MG/ML injection 15 mg (15 mg Intravenous Given 01/02/23 1516)  prochlorperazine (COMPAZINE) injection 10 mg (10 mg Intravenous Given 01/02/23 1515)  diphenhydrAMINE (BENADRYL) injection 25 mg (25 mg Intravenous Given 01/02/23 1515)  iohexol (OMNIPAQUE) 350 MG/ML injection 75 mL (75 mLs Intravenous Contrast Given 01/02/23 1543)  dexamethasone (DECADRON) injection 10 mg (10 mg Intravenous Given 01/02/23 1744)    FINAL CLINICAL IMPRESSION(S) / ED DIAGNOSES   Final diagnoses:  Migraine without status migrainosus, not intractable, unspecified migraine type     Rx / DC Orders   ED Discharge Orders     None        Note:  This document was prepared using Dragon voice recognition software and may include unintentional dictation errors.   Corena Herter, MD 01/02/23 [phone]

## 2023-01-02 NOTE — Discharge Instructions (Signed)
You had a CTA of your head and neck that did not show any vessel issues.  The CT scan of your head was normal.

## 2023-01-02 NOTE — ED Triage Notes (Signed)
Patient to ED via POV for migraine x1.5 months. Worse today. No meds taken today. Also having nausea.

## 2023-01-03 ENCOUNTER — Encounter: Payer: Self-pay | Admitting: Gastroenterology

## 2023-01-09 NOTE — Progress Notes (Unsigned)
PROVIDER NOTE: Information contained herein reflects review and annotations entered in association with encounter. Interpretation of such information and data should be left to medically-trained personnel. Information provided to patient can be located elsewhere in the medical record under "Patient Instructions". Document created using STT-dictation technology, any transcriptional errors that may result from process are unintentional.    Patient: Elizabeth Bowers  Service Category: E/M  Provider: Oswaldo Done, MD  DOB: [birth year]  DOS: 01/10/2023  Referring Provider: Erasmo Downer, MD  MRN: [id number]  Specialty: Interventional Pain Management  PCP: Erasmo Downer, MD  Type: Established Patient  Setting: Ambulatory outpatient    Location: Office  Delivery: Face-to-face     HPI  [name], a 57 y.o. year old female, is here today because of her Chronic neck pain [M54.2, G89.29]. Elizabeth Bowers primary complain today is No chief complaint on file.  Pertinent problems: Elizabeth Bowers has Chronic sacroiliac joint pain (Right); Fibromyalgia; Chronic knee pain (Right); DDD (degenerative disc disease), lumbar; Chronic low back pain (2ry area of Pain) (Bilateral) (L>R) w/ sciatica (Bilateral); Chronic lower extremity pain (3ry area of Pain) (Bilateral) (L>R); Chronic neck pain (1ry area of Pain) (Bilateral) (midline); Chronic foot pain (Left); Chronic pain syndrome; Ganglion cyst; Chronic upper extremity pain (4th area of Pain) (Bilateral) (R>L); Cervical Grade 1 (2 mm) Retrolisthesis C5 over C6; Cervical foraminal stenosis (C3-4 & C5-6) (Bilateral); DDD (degenerative disc disease), cervical; Spondylosis without myelopathy or radiculopathy, cervical region; Chronic hip pain (Bilateral) (L>R); Lumbar facet syndrome (Bilateral) (R>L); Spondylosis without myelopathy or radiculopathy, lumbar region; Chronic Sacroiliac joint dysfunction (Bilateral) (R>L); Chronic Somatic dysfunction of sacroiliac joint  (Bilateral) (R>L); Osteoarthritis of hip (Bilateral); Cervicogenic headache (Bilateral); Cervicalgia (Bilateral) (R>L); Cervical facet syndrome (Bilateral) (R>L); Chronic occipital neuralgia (Bilateral); Cervical spondylitis with radiculitis (HCC) (C6) (Bilateral) (R>L); Chronic sacroiliac joint pain (Bilateral) (R>L); Abnormal MRI, cervical spine (07/10/2018); Chronic low back pain (Bilateral) w/o sciatica; Morton's neuroma of foot (Left); Chronic hip pain (Right); Impaired range of motion of hip (Right); Chronic lower extremity pain (Right); Lower extremity radicular pain (Right); Lumbar radiculitis (L3/L4) (Right); and Cervical radiculopathy at C6 (Right) on their pertinent problem list. Pain Assessment: Severity of   is reported as a  /10. Location:    / . Onset:  . Quality:  . Timing:  . Modifying factor(s):  Marland Kitchen Vitals:  vitals were not taken for this visit.  BMI: Estimated body mass index is 31.83 kg/m as calculated from the following:   Height as of 01/01/23: 5\' 2"  (1.575 m).   Weight as of 01/01/23: 174 lb (78.9 kg). Last encounter: 05/22/2022. Last procedure: 05/11/2022.  Reason for encounter: evaluation of worsening, or previously known (established) problem. ***  Pharmacotherapy Assessment  Analgesic: No chronic opioid analgesics therapy prescribed by our practice.   Monitoring: [place] PMP: PDMP reviewed during this encounter.       Pharmacotherapy: No side-effects or adverse reactions reported. Compliance: No problems identified. Effectiveness: Clinically acceptable.  No notes on file  No results found for: "CBDTHCR" No results found for: "D8THCCBX" No results found for: "D9THCCBX"  UDS:  Summary  Date Value Ref Range Status  01/16/2018 FINAL  Final    Comment:    ==================================================================== TOXASSURE COMP DRUG ANALYSIS,UR ==================================================================== Test                             Result       Flag  Units Drug Present and Declared for Prescription Verification   Oxazepam                       51           EXPECTED   ng/mg creat   Temazepam                      44           EXPECTED   ng/mg creat    Oxazepam and temazepam are expected metabolites of diazepam.    Oxazepam is also an expected metabolite of other benzodiazepine    drugs, including chlordiazepoxide, prazepam, clorazepate,    halazepam, and temazepam.  Oxazepam and temazepam are available    as scheduled prescription medications.   Phentermine                    PRESENT      EXPECTED   Duloxetine                     PRESENT      EXPECTED Drug Present not Declared for Prescription Verification   Topiramate                     PRESENT      UNEXPECTED   Acetaminophen                  PRESENT      UNEXPECTED Drug Absent but Declared for Prescription Verification   Cyclobenzaprine                Not Detected UNEXPECTED   Promethazine                   Not Detected UNEXPECTED ==================================================================== Test                      Result    Flag   Units      Ref Range   Creatinine              57               mg/dL      >=40 ==================================================================== Declared Medications:  The flagging and interpretation on this report are based on the  following declared medications.  Unexpected results may arise from  inaccuracies in the declared medications.  **Note: The testing scope of this panel includes these medications:  Cyclobenzaprine  Diazepam  Duloxetine  Phentermine  Promethazine  **Note: The testing scope of this panel does not include following  reported medications:  Acyclovir  Budesonide  Docusate  Hydrochlorothiazide (Triamterine-Hydrochlorthzide)  Levocetirizine  Linaclotide  Meclizine  Montelukast  Multivitamin  Potassium  Triamterene (Triamterine-Hydrochlorthzide)   Valacyclovir ==================================================================== For clinical consultation, please call [phone]. ====================================================================       Bowers  Constitutional: Denies any fever or chills Gastrointestinal: No reported hemesis, hematochezia, vomiting, or acute GI distress Musculoskeletal: Denies any acute onset joint swelling, redness, loss of ROM, or weakness Neurological: No reported episodes of acute onset apraxia, aphasia, dysarthria, agnosia, amnesia, paralysis, loss of coordination, or loss of consciousness  Medication Review  Budesonide, DULoxetine, Ubrogepant, amLODipine, buPROPion, lansoprazole, linaclotide, methylPREDNISolone, multivitamin with minerals, ondansetron, oxybutynin, and triamterene-hydrochlorothiazide  History Review  Allergy: Elizabeth Bowers is allergic to gluten meal, lactose, ranitidine, amphetamine-dextroamphetamine, lactose intolerance (gi), reglan [metoclopramide], topiramate er, wheat, soap, and sulfa antibiotics. Drug: Elizabeth Bowers  reports no history of drug use. Alcohol:  reports current alcohol use of about 2.0 - 4.0 standard drinks of alcohol per week. Tobacco:  reports that she quit smoking about 35 years ago. Her smoking use included cigarettes. She started smoking about 40 years ago. She has a 3.8 pack-year smoking history. She has never used smokeless tobacco. Social: Elizabeth Bowers  reports that she quit smoking about 35 years ago. Her smoking use included cigarettes. She started smoking about 40 years ago. She has a 3.8 pack-year smoking history. She has never used smokeless tobacco. She reports current alcohol use of about 2.0 - 4.0 standard drinks of alcohol per week. She reports that she does not use drugs. Medical:  has a past medical history of Fatty liver, Fibromyalgia, GERD (gastroesophageal reflux disease), Hemorrhoids, Hot flashes, Hypertension, IBS (irritable bowel syndrome), LUQ abdominal  pain (10/25/2016), Migraines, Miscarriage, Multilevel degenerative disc disease, Neuroma, and Vocal cord dysfunction. Surgical: Elizabeth Bowers  has a past surgical history that includes Elbow Debridement; Foot surgery; Cholecystectomy; Colonoscopy; Abdominal hysterectomy; Esophagogastroduodenoscopy (egd) with propofol (N/A, 02/06/2018); Colonoscopy with propofol (N/A, 12/22/2019); Colonoscopy with propofol (N/A, 12/23/2019); Image guided sinus surgery (N/A, 09/28/2021); Maxillary antrostomy (Bilateral, 09/28/2021); Ethmoidectomy (N/A, 09/28/2021); Sphenoidectomy (Bilateral, 09/28/2021); Colonoscopy with propofol (N/A, 01/01/2023); and polypectomy (01/01/2023). Family: family history includes Alcohol abuse in her father and son; Arthritis in her son and son; Asthma in her sister; COPD in her father, paternal grandmother, and sister; Cancer in her paternal grandfather and sister; Colon cancer in her sister; Colon polyps in her sister; Depression in her son; Diabetes in her maternal grandmother, mother, and son; Drug abuse in her son; Glaucoma in her mother; Hyperlipidemia in her mother; Hypertension in her mother; Kidney disease in her sister; Mental illness in her sister and son; Miscarriages / Stillbirths in her sister and sister; Uterine cancer in her sister.  Laboratory Chemistry Profile   Renal Lab Results  Component Value Date   BUN 18 01/02/2023   CREATININE 0.94 01/02/2023   BCR 13 11/02/2022   GFRAA >60 07/07/2019   GFRNONAA >60 01/02/2023    Hepatic Lab Results  Component Value Date   AST 39 11/02/2022   ALT 56 (H) 11/02/2022   ALBUMIN 4.4 11/02/2022   ALKPHOS 100 11/02/2022   LIPASE 33 12/15/2016    Electrolytes Lab Results  Component Value Date   NA 140 01/02/2023   K 3.4 (L) 01/02/2023   CL 103 01/02/2023   CALCIUM 9.2 01/02/2023   MG 2.2 01/16/2018    Bone Lab Results  Component Value Date   25OHVITD1 51 01/16/2018   25OHVITD2 <1.0 01/16/2018   25OHVITD3 50 01/16/2018     Inflammation (CRP: Acute Phase) (ESR: Chronic Phase) Lab Results  Component Value Date   CRP 0.8 07/05/2021   ESRSEDRATE 17 07/05/2021         Note: Above Lab results reviewed.  Recent Imaging Review  CT Angio Head Neck W WO CM CLINICAL DATA:  [facility]  EXAM: CT ANGIOGRAPHY HEAD AND NECK WITH AND WITHOUT CONTRAST  TECHNIQUE: Multidetector CT imaging of the head and neck was performed using the standard protocol during bolus administration of intravenous contrast. Multiplanar CT image reconstructions and MIPs were obtained to evaluate the vascular anatomy. Carotid stenosis measurements (when applicable) are obtained utilizing NASCET criteria, using the distal internal carotid diameter as the denominator.  RADIATION DOSE REDUCTION: This exam was performed according to the departmental dose-optimization program which includes automated exposure control, adjustment of the mA and/or kV  according to patient size and/or use of iterative reconstruction technique.  CONTRAST:  75mL OMNIPAQUE IOHEXOL 350 MG/ML SOLN  COMPARISON:  None Available.  FINDINGS: CT HEAD FINDINGS  Brain: No evidence of acute infarction, hemorrhage, hydrocephalus, extra-axial collection or mass lesion/mass effect.  Vascular: No hyperdense vessel or unexpected calcification.  Skull: Normal. Negative for fracture or focal lesion.  Sinuses/Orbits: No middle ear or mastoid effusion. Paranasal sinuses are clear. Orbits are unremarkable.  Other: None.  Review of the MIP images confirms the above findings  CTA NECK FINDINGS  Aortic arch: Standard branching. Imaged portion shows no evidence of aneurysm or dissection. No significant stenosis of the major arch vessel origins.  Right carotid system: No evidence of dissection, stenosis (50% or greater), or occlusion.  Left carotid system: No evidence of dissection, stenosis (50% or greater), or occlusion.  Vertebral arteries: Codominant. No evidence  of dissection, stenosis (50% or greater), or occlusion.  Skeleton: Negative.  Other neck: Subcentimeter left thyroid nodule. This requires no imaging follow up.  Upper chest: Negative.  Review of the MIP images confirms the above findings  CTA HEAD FINDINGS  Anterior circulation: No significant stenosis, proximal occlusion, aneurysm, or vascular malformation.  Posterior circulation: No significant stenosis, proximal occlusion, aneurysm, or vascular malformation.  Venous sinuses: As permitted by contrast timing, patent.  Anatomic variants: None  Review of the MIP images confirms the above findings  IMPRESSION: 1. No acute intracranial abnormality. 2. No intracranial large vessel occlusion or significant stenosis. 3. No hemodynamically significant stenosis in the neck.  Electronically Signed   By: Lorenza Cambridge M.D.   On: 01/02/2023 16:26 Note: Reviewed        Physical Exam  General appearance: Well nourished, well developed, and well hydrated. In no apparent acute distress Mental status: Alert, oriented x 3 (person, place, & time)       Respiratory: No evidence of acute respiratory distress Eyes: PERLA Vitals: There were no vitals taken for this visit. BMI: Estimated body mass index is 31.83 kg/m as calculated from the following:   Height as of 01/01/23: 5\' 2"  (1.575 m).   Weight as of 01/01/23: 174 lb (78.9 kg). Ideal: Ideal body weight: 50.1 kg (110 lb 7.2 oz) Adjusted ideal body weight: 61.6 kg (135 lb 13.9 oz)  Assessment   Diagnosis Status  1. Chronic neck pain (1ry area of Pain) (Bilateral) (midline)   2. Cervical radiculopathy at C6 (Right)   3. Cervical Grade 1 (2 mm) Retrolisthesis C5 over C6   4. 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 Cervical facet syndrome (Bilateral) (R>L)   6. Cervical spondylitis with radiculitis (HCC) (C6) (Bilateral) (R>L)   7. Cervicalgia (Bilateral) (R>L)   8. Abnormal MRI, cervical spine (07/10/2018)   9.  Chronic low back pain (2ry area of Pain) (Bilateral) (L>R) w/ sciatica (Bilateral)   10. Chronic lower extremity pain (3ry area of Pain) (Bilateral) (L>R)   11. DDD (degenerative disc disease), lumbar   12. Lower extremity radicular pain (Right)    Controlled Controlled Controlled   Updated Problems: No problems updated.  Plan of Care  Problem-specific:  No problem-specific Assessment & Plan notes found for this encounter.  Elizabeth Bowers has a current medication list which includes the following long-term medication(s): amlodipine, bupropion, duloxetine, lansoprazole, linzess, and triamterene-hydrochlorothiazide.  Pharmacotherapy (Medications Ordered): No orders of the defined types were placed in this encounter.  Orders:  No orders of the defined types were placed in this encounter.  Follow-up plan:   No follow-ups on file.      Interventional Therapies  Risk  Complexity Considerations:   WNL   Planned  Pending:   Therapeutic right cervical ESI #3    Under consideration:   Diagnostic/therapeutic right cervical ESI #2 of 2023    Completed:   Diagnostic/therapeutic right L3-4 LESI x1 (09/08/2021) (100/100/100/100)  Palliative bilateral cervical facet MBB x2 (06/13/2018) (100/100/0/<25)  Diagnostic bilateral lumbar facet MBB x2 (08/08/2018) [facility])  Diagnostic right Cervical ESI x2 (05/11/2022) (100/100/75/>50)  Therapeutic right cervical facet RFA x1 (07/23/2018) (100/50/0)    Completed by other providers:   Therapeutic sphenopalatine ganglion Blk by Dr. Clelia Croft x2 (03/04/2018) Diagnostic bilateral upper extremity EMG/PNCV by Dr. Clelia Croft: Right Grade II & Left Grade I CTS (08/23/2018)    Therapeutic  Palliative (PRN) options:   Therapeutic/palliative cervical facet MBB   Therapeutic/palliativelumbar facet MBB   Therapeutic/palliative Cervical ESI   Therapeutic/palliative cervical facet RFA         Recent Visits No visits were found meeting these  conditions. Showing recent visits within past 90 days and meeting all other requirements Future Appointments Date Type Provider Dept  01/10/23 Appointment Delano Metz, MD [facility]  Showing future appointments within next 90 days and meeting all other requirements  I discussed the assessment and treatment plan with the patient. The patient was provided an opportunity to ask questions and all were answered. The patient agreed with the plan and demonstrated an understanding of the instructions.  Patient advised to call back or seek an in-person evaluation if the symptoms or condition worsens.  Duration of encounter: *** minutes.  Total time on encounter, as per AMA guidelines included both the face-to-face and non-face-to-face time personally spent by the physician and/or other qualified health care professional(s) on the day of the encounter (includes time in activities that require the physician or other qualified health care professional and does not include time in activities normally performed by clinical staff). Physician's time may include the following activities when performed: Preparing to see the patient (e.g., pre-charting review of records, searching for previously ordered imaging, lab work, and nerve conduction tests) Review of prior analgesic pharmacotherapies. Reviewing PMP Interpreting ordered tests (e.g., lab work, imaging, nerve conduction tests) Performing post-procedure evaluations, including interpretation of diagnostic procedures Obtaining and/or reviewing separately obtained history Performing a medically appropriate examination and/or evaluation Counseling and educating the patient/family/caregiver Ordering medications, tests, or procedures Referring and communicating with other health care professionals (when not separately reported) Documenting clinical information in the electronic or other health record Independently interpreting results (not  separately reported) and communicating results to the patient/ family/caregiver Care coordination (not separately reported)  Note by: Oswaldo Done, MD Date: 01/10/2023; Time: 9:23 PM

## 2023-01-10 ENCOUNTER — Ambulatory Visit: Payer: 59 | Attending: Pain Medicine | Admitting: Pain Medicine

## 2023-01-10 ENCOUNTER — Telehealth: Payer: Self-pay

## 2023-01-10 ENCOUNTER — Encounter: Payer: Self-pay | Admitting: Pain Medicine

## 2023-01-10 VITALS — BP 140/89 | HR 91 | Temp 98.1°F | Resp 16 | Ht 62.0 in | Wt 173.0 lb

## 2023-01-10 DIAGNOSIS — M51369 Other intervertebral disc degeneration, lumbar region without mention of lumbar back pain or lower extremity pain: Secondary | ICD-10-CM

## 2023-01-10 DIAGNOSIS — M79605 Pain in left leg: Secondary | ICD-10-CM | POA: Insufficient documentation

## 2023-01-10 DIAGNOSIS — M5441 Lumbago with sciatica, right side: Secondary | ICD-10-CM | POA: Diagnosis not present

## 2023-01-10 DIAGNOSIS — M4802 Spinal stenosis, cervical region: Secondary | ICD-10-CM

## 2023-01-10 DIAGNOSIS — G8929 Other chronic pain: Secondary | ICD-10-CM | POA: Diagnosis not present

## 2023-01-10 DIAGNOSIS — M542 Cervicalgia: Secondary | ICD-10-CM

## 2023-01-10 DIAGNOSIS — M4312 Spondylolisthesis, cervical region: Secondary | ICD-10-CM | POA: Diagnosis not present

## 2023-01-10 DIAGNOSIS — M4692 Unspecified inflammatory spondylopathy, cervical region: Secondary | ICD-10-CM | POA: Diagnosis not present

## 2023-01-10 DIAGNOSIS — M5136 Other intervertebral disc degeneration, lumbar region: Secondary | ICD-10-CM | POA: Diagnosis present

## 2023-01-10 DIAGNOSIS — M4310 Spondylolisthesis, site unspecified: Secondary | ICD-10-CM | POA: Diagnosis not present

## 2023-01-10 DIAGNOSIS — M47812 Spondylosis without myelopathy or radiculopathy, cervical region: Secondary | ICD-10-CM

## 2023-01-10 DIAGNOSIS — M79604 Pain in right leg: Secondary | ICD-10-CM | POA: Insufficient documentation

## 2023-01-10 DIAGNOSIS — M5412 Radiculopathy, cervical region: Secondary | ICD-10-CM

## 2023-01-10 DIAGNOSIS — M5442 Lumbago with sciatica, left side: Secondary | ICD-10-CM | POA: Insufficient documentation

## 2023-01-10 DIAGNOSIS — M5410 Radiculopathy, site unspecified: Secondary | ICD-10-CM | POA: Diagnosis present

## 2023-01-10 DIAGNOSIS — G4486 Cervicogenic headache: Secondary | ICD-10-CM

## 2023-01-10 DIAGNOSIS — R937 Abnormal findings on diagnostic imaging of other parts of musculoskeletal system: Secondary | ICD-10-CM

## 2023-01-10 NOTE — Progress Notes (Signed)
Safety precautions to be maintained throughout the outpatient stay will include: orient to surroundings, keep bed in low position, maintain call bell within reach at all times, provide assistance with transfer out of bed and ambulation.  

## 2023-01-10 NOTE — Patient Instructions (Addendum)

## 2023-01-10 NOTE — Progress Notes (Signed)
PROVIDER NOTE: Interpretation of information contained herein should be left to medically-trained personnel. Specific patient instructions are provided elsewhere under "Patient Instructions" section of medical record. This document was created in part using STT-dictation technology, any transcriptional errors that may result from this process are unintentional.  Patient: Elizabeth Bowers Type: Established DOB: May 20, 1966 MRN: [id number] PCP: Erasmo Downer, MD  Service: Procedure DOS: 01/11/2023 Setting: Ambulatory Location: Ambulatory outpatient facility Delivery: Face-to-face Provider: Oswaldo Done, MD Specialty: Interventional Pain Management Specialty designation: 09 Location: Outpatient facility Ref. Prov.: Erasmo Downer, MD       Interventional Therapy   Procedure: Cervical Epidural Steroid injection (CESI) (Interlaminar) #3 (first of 2024) Laterality: Right  Level: C7-T1 Imaging: Fluoroscopy-assisted DOS: 01/11/2023  Performed by: Delano Metz, MD Anesthesia: Local anesthesia (1-2% Lidocaine) Anxiolysis: IV Versed 2.0 mg Sedation: Moderate Sedation Fentanyl 1 mL (50 mcg)   Purpose: Diagnostic/Therapeutic Indications: Cervicalgia, cervical radicular pain, degenerative disc disease, severe enough to impact quality of life or function. 1. Cervicogenic headache (Bilateral)   2. Cervicalgia (Bilateral) (R>L)   3. DDD (degenerative disc disease), cervical   4. Chronic occipital neuralgia (Bilateral)   5. Cervical spondylitis with radiculitis (HCC) (C6) (Bilateral) (R>L)   6. Cervical radiculopathy at C6 (Right)   7. Cervical Grade 1 (2 mm) Retrolisthesis C5 over C6   8. Cervical foraminal stenosis (C3-4 & C5-6) (Bilateral)   9. Cervical facet syndrome (Bilateral) (R>L)   10. Chronic upper extremity pain (4th area of Pain) (Bilateral) (R>L)    NAS-11 score:   Pre-procedure: 4 /10   Post-procedure: 4 /10     Position  Prep  Materials:  Location  setting: Procedure suite Position: Prone, on modified reverse trendelenburg to facilitate breathing, with head in head-cradle. Pillows positioned under chest (below chin-level) with cervical spine flexed. Safety Precautions: Patient was assessed for positional comfort and pressure points before starting the procedure. Prepping solution: DuraPrep (Iodine Povacrylex [0.7% available iodine] and Isopropyl Alcohol, 74% w/w) Prep Area: Entire  cervicothoracic region Approach: percutaneous, paramedial Intended target: Posterior cervical epidural space Materials Procedure:  Tray: Epidural Needle(s): Epidural (Tuohy) Qty: 1 Length: (90mm) 3.5-inch Gauge: 17G   H&P (Pre-op Assessment):  Elizabeth Bowers is a 57 y.o. (year old), female patient, seen today for interventional treatment. She  has a past surgical history that includes Elbow Debridement; Foot surgery; Cholecystectomy; Colonoscopy; Abdominal hysterectomy; Esophagogastroduodenoscopy (egd) with propofol (N/A, 02/06/2018); Colonoscopy with propofol (N/A, 12/22/2019); Colonoscopy with propofol (N/A, 12/23/2019); Image guided sinus surgery (N/A, 09/28/2021); Maxillary antrostomy (Bilateral, 09/28/2021); Ethmoidectomy (N/A, 09/28/2021); Sphenoidectomy (Bilateral, 09/28/2021); Colonoscopy with propofol (N/A, 01/01/2023); and polypectomy (01/01/2023). Elizabeth Bowers has a current medication list which includes the following prescription(s): amlodipine, budesonide, bupropion, duloxetine, lansoprazole, linzess, methylprednisolone, multivitamin with minerals, ondansetron, oxybutynin, triamterene-hydrochlorothiazide, and ubrelvy, and the following Facility-Administered Medications: fentanyl and lactated ringers. Her primarily concern today is the Headache (Head pain)  Initial Vital Signs:  Pulse/HCG Rate: 76  Temp: (!) 97.4 F (36.3 C) Resp: 16 BP: 132/86 SpO2: 100 %  BMI: Estimated body mass index is 32.01 kg/m as calculated from the following:   Height as of this  encounter: 5\' 2"  (1.575 m).   Weight as of this encounter: 175 lb (79.4 kg).  Risk Assessment: Allergies: Reviewed. She is allergic to gluten meal, lactose, ranitidine, amphetamine-dextroamphetamine, lactose intolerance (gi), reglan [metoclopramide], topiramate er, wheat, soap, and sulfa antibiotics.  Allergy Precautions: None required Coagulopathies: Reviewed. None identified.  Blood-thinner therapy: None at this time Active Infection(s): Reviewed. None identified. Elizabeth Bowers  is afebrile  Site Confirmation: Elizabeth Bowers was asked to confirm the procedure and laterality before marking the site Procedure checklist: Completed Consent: Before the procedure and under the influence of no sedative(s), amnesic(s), or anxiolytics, the patient was informed of the treatment options, risks and possible complications. To fulfill our ethical and legal obligations, as recommended by the American Medical Association's Code of Ethics, I have informed the patient of my clinical impression; the nature and purpose of the treatment or procedure; the risks, benefits, and possible complications of the intervention; the alternatives, including doing nothing; the risk(s) and benefit(s) of the alternative treatment(s) or procedure(s); and the risk(s) and benefit(s) of doing nothing. The patient was provided information about the general risks and possible complications associated with the procedure. These may include, but are not limited to: failure to achieve desired goals, infection, bleeding, organ or nerve damage, allergic reactions, paralysis, and death. In addition, the patient was informed of those risks and complications associated to Spine-related procedures, such as failure to decrease pain; infection (i.e.: Meningitis, epidural or intraspinal abscess); bleeding (i.e.: epidural hematoma, subarachnoid hemorrhage, or any other type of intraspinal or peri-dural bleeding); organ or nerve damage (i.e.: Any type of peripheral  nerve, nerve root, or spinal cord injury) with subsequent damage to sensory, motor, and/or autonomic systems, resulting in permanent pain, numbness, and/or weakness of one or several areas of the body; allergic reactions; (i.e.: anaphylactic reaction); and/or death. Furthermore, the patient was informed of those risks and complications associated with the medications. These include, but are not limited to: allergic reactions (i.e.: anaphylactic or anaphylactoid reaction(s)); adrenal axis suppression; blood sugar elevation that in diabetics may result in ketoacidosis or comma; water retention that in patients with history of congestive heart failure may result in shortness of breath, pulmonary edema, and decompensation with resultant heart failure; weight gain; swelling or edema; medication-induced neural toxicity; particulate matter embolism and blood vessel occlusion with resultant organ, and/or nervous system infarction; and/or aseptic necrosis of one or more joints. Finally, the patient was informed that Medicine is not an exact science; therefore, there is also the possibility of unforeseen or unpredictable risks and/or possible complications that may result in a catastrophic outcome. The patient indicated having understood very clearly. We have given the patient no guarantees and we have made no promises. Enough time was given to the patient to ask questions, all of which were answered to the patient's satisfaction. Ms. Peerenboom has indicated that she wanted to continue with the procedure. Attestation: I, the ordering provider, attest that I have discussed with the patient the benefits, risks, side-effects, alternatives, likelihood of achieving goals, and potential problems during recovery for the procedure that I have provided informed consent. Date  Time: 01/11/2023  9:38 AM   Pre-Procedure Preparation:  Monitoring: As per clinic protocol. Respiration, ETCO2, SpO2, BP, heart rate and rhythm monitor placed  and checked for adequate function Safety Precautions: Patient was assessed for positional comfort and pressure points before starting the procedure. Time-out: I initiated and conducted the "Time-out" before starting the procedure, as per protocol. The patient was asked to participate by confirming the accuracy of the "Time Out" information. Verification of the correct person, site, and procedure were performed and confirmed by me, the nursing staff, and the patient. "Time-out" conducted as per Joint Commission's Universal Protocol (UP.01.01.01). Time: 1053 Start Time: 1053 hrs.  Description  Narrative of Procedure:          Rationale (medical necessity): procedure needed and proper for the  diagnosis and/or treatment of the patient's medical symptoms and needs. Start Time: 1053 hrs. Safety Precautions: Aspiration looking for blood return was conducted prior to all injections. At no point did we inject any substances, as a needle was being advanced. No attempts were made at seeking any paresthesias. Safe injection practices and needle disposal techniques used. Medications properly checked for expiration dates. SDV (single dose vial) medications used. Description of procedure: Protocol guidelines were followed. The patient was assisted into a comfortable position. The target area was identified and the area prepped in the usual manner. Skin & deeper tissues infiltrated with local anesthetic. Appropriate amount of time allowed to pass for local anesthetics to take effect. Using fluoroscopic guidance, the epidural needle was introduced through the skin, ipsilateral to the reported pain, and advanced to the target area. Posterior laminar os was contacted and the needle walked caudad, until the lamina was cleared. The ligamentum flavum was engaged and the epidural space identified using "loss-of-resistance technique" with 2-3 ml of PF-NaCl (0.9% NSS), in a 5cc dedicated LOR syringe. (See "Imaging guidance" below  for use of contrast details.) Once proper needle placement was secured, and negative aspiration confirmed, the solution was injected in intermittent fashion, asking for systemic symptoms every 0.5cc. The needles were then removed and the area cleansed, making sure to leave some of the prepping solution back to take advantage of its long term bactericidal properties.  Vitals:   01/11/23 1048 01/11/23 1053 01/11/23 1058 01/11/23 1100  BP: (!) 150/89 124/87 126/82 125/81  Pulse: 63 62 61 62  Resp: 16 15 16 18   Temp:      TempSrc:      SpO2: 99% 100% 99% 100%  Weight:      Height:         End Time: 1100 hrs.  Imaging Guidance (Spinal):          Type of Imaging Technique: Fluoroscopy Guidance (Spinal) Indication(s): Assistance in needle guidance and placement for procedures requiring needle placement in or near specific anatomical locations not easily accessible without such assistance. Exposure Time: Please see nurses notes. Contrast: Before injecting any contrast, we confirmed that the patient did not have an allergy to iodine, shellfish, or radiological contrast. Once satisfactory needle placement was completed at the desired level, radiological contrast was injected. Contrast injected under live fluoroscopy. No contrast complications. See chart for type and volume of contrast used. Fluoroscopic Guidance: I was personally present during the use of fluoroscopy. "Tunnel Vision Technique" used to obtain the best possible view of the target area. Parallax error corrected before commencing the procedure. "Direction-depth-direction" technique used to introduce the needle under continuous pulsed fluoroscopy. Once target was reached, antero-posterior, oblique, and lateral fluoroscopic projection used confirm needle placement in all planes. Images permanently stored in EMR. Interpretation: I personally interpreted the imaging intraoperatively. Adequate needle placement confirmed in multiple planes.  Appropriate spread of contrast into desired area was observed. No evidence of afferent or efferent intravascular uptake. No intrathecal or subarachnoid spread observed. Permanent images saved into the patient's record.  Post-operative Assessment:  Post-procedure Vital Signs:  Pulse/HCG Rate: 62  Temp: (!) 97.4 F (36.3 C) Resp: 18 BP: 125/81 SpO2: 100 %  EBL: None  Complications: No immediate post-treatment complications observed by team, or reported by patient.  Note: The patient tolerated the entire procedure well. A repeat set of vitals were taken after the procedure and the patient was kept under observation following institutional policy, for this type of procedure. Post-procedural neurological  assessment was performed, showing return to baseline, prior to discharge. The patient was provided with post-procedure discharge instructions, including a section on how to identify potential problems. Should any problems arise concerning this procedure, the patient was given instructions to immediately contact us, at any time, without hesitation. In any case, we plan to contact the patient by telephone for a follow-up status report regarding this interventional procedure.  Comments:  No additional relevant information.  Plan of Care (POC)  Orders:  Orders Placed This Encounter  Procedures   Cervical Epidural Injection    Indication(s): Radiculitis and cervicalgia associated with cervical degenerative disc disease. Position: Prone Imaging guidance: Fluoroscopy required. Contrast required unless contraindicated by allergy or severe CKD. Equipment & Materials: Epidural tray & needle.    Scheduling Instructions:     Procedure: Cervical Epidural Steroid Injection/Block     Planned Level(s): C7-T1     Laterality: Right-sided     Anxiolysis: Patient's choice.     Timeframe: Today    Order Specific Question:   Where will this procedure be performed?    Answer:   [facility]     Comments:   by Dr. Conception Oms [facility] C-ARM 1-60 MIN NO REPORT    Intraoperative interpretation by procedural physician at [facility].    Standing Status:   Standing    Number of Occurrences:   1    Order Specific Question:   Reason for exam:    Answer:   Assistance in needle guidance and placement for procedures requiring needle placement in or near specific anatomical locations not easily accessible without such assistance.   Informed Consent Details: Physician/Practitioner Attestation; Transcribe to consent form and obtain patient signature    Nursing instructions: Transcribe to consent form and obtain patient signature. Always confirm laterality of pain with Ms. Cristofaro, before procedure.    Order Specific Question:   Physician/Practitioner attestation of informed consent for procedure/surgical case    Answer:   I, the physician/practitioner, attest that I have discussed with the patient the benefits, risks, side effects, alternatives, likelihood of achieving goals and potential problems during recovery for the procedure that I have provided informed consent.    Order Specific Question:   Procedure    Answer:   Cervical Epidural Steroid Injection (CESI) under fluoroscopic guidance    Order Specific Question:   Physician/Practitioner performing the procedure    Answer:   Magdalena Skilton A. Laban Emperor MD    Order Specific Question:   Indication/Reason    Answer:   Indications: Cervicalgia (neck pain), cervical radicular pain, radiculitis (arm/shoulder pain, numbness, and/or weakness), degenerative disc disease, severe enough to greatly impact quality of life or function.   Provide equipment / supplies at bedside    Procedural tray: Epidural Tray (Disposable  single use) Skin infiltration needle: Regular 1.5-in, 25-G, (x1) Block needle size: Regular standard Catheter: No catheter required    Standing Status:   Standing    Number of Occurrences:   1    Order Specific Question:    Specify    Answer:   Epidural Tray   Chronic Opioid Analgesic:  No chronic opioid analgesics therapy prescribed by our practice.   Medications ordered for procedure: Meds ordered this encounter  Medications   lidocaine (XYLOCAINE) 2 % (with pres) injection 400 mg   pentafluoroprop-tetrafluoroeth (GEBAUERS) aerosol   lactated ringers infusion   midazolam (VERSED) 5 MG/5ML injection 0.5-2 mg    Make sure Flumazenil is available in the pyxis  when using this medication. If oversedation occurs, administer 0.2 mg IV over 15 sec. If after 45 sec no response, administer 0.2 mg again over 1 min; may repeat at 1 min intervals; not to exceed 4 doses (1 mg)   fentaNYL (SUBLIMAZE) injection 25-50 mcg    Make sure Narcan is available in the pyxis when using this medication. In the event of respiratory depression (RR< 8/min): Titrate NARCAN (naloxone) in increments of 0.1 to 0.2 mg IV at 2-3 minute intervals, until desired degree of reversal.   sodium chloride flush (NS) 0.9 % injection 1 mL   ropivacaine (PF) 2 mg/mL (0.2%) (NAROPIN) injection 1 mL   dexamethasone (DECADRON) injection 10 mg   Medications administered: We administered lidocaine, pentafluoroprop-tetrafluoroeth, lactated ringers, midazolam, fentaNYL, sodium chloride flush, ropivacaine (PF) 2 mg/mL (0.2%), and dexamethasone.  See the medical record for exact dosing, route, and time of administration.  Follow-up plan:   Return in about 2 weeks (around 01/25/2023) for (Face2F), (PPE).       Interventional Therapies  Risk  Complexity Considerations:   WNL   Planned  Pending:   Therapeutic right cervical ESI #3    Under consideration:   Diagnostic/therapeutic right cervical ESI #2 of 2023    Completed:   Diagnostic/therapeutic right L3-4 LESI x1 (09/08/2021) (100/100/100/100)  Palliative bilateral cervical facet MBB x2 (06/13/2018) (100/100/0/<25)  Diagnostic bilateral lumbar facet MBB x2 (08/08/2018) [facility])  Diagnostic right  Cervical ESI x2 (05/11/2022) (100/100/75/>50)  Therapeutic right cervical facet RFA x1 (07/23/2018) (100/50/0)    Completed by other providers:   Therapeutic sphenopalatine ganglion Blk by Dr. Clelia Croft x2 (03/04/2018) Diagnostic bilateral upper extremity EMG/PNCV by Dr. Clelia Croft: Right Grade II & Left Grade I CTS (08/23/2018)    Therapeutic  Palliative (PRN) options:   Therapeutic/palliative cervical facet MBB   Therapeutic/palliativelumbar facet MBB   Therapeutic/palliative Cervical ESI   Therapeutic/palliative cervical facet RFA         Recent Visits Date Type Provider Dept  01/10/23 Office Visit Delano Metz, MD [facility]  Showing recent visits within past 90 days and meeting all other requirements Today's Visits Date Type Provider Dept  01/11/23 Procedure visit Delano Metz, MD [facility]  Showing today's visits and meeting all other requirements Future Appointments Date Type Provider Dept  02/01/23 Appointment Delano Metz, MD [facility]  Showing future appointments within next 90 days and meeting all other requirements  Disposition: Discharge home  Discharge (Date  Time): 01/11/2023;   hrs.   Primary Care Physician: Erasmo Downer, MD Location: [facility] Note by: Oswaldo Done, MD (TTS technology used. I apologize for any typographical errors that were not detected and corrected.) Date: 01/11/2023; Time: 11:07 AM  Disclaimer:  Medicine is not an Visual merchandiser. The only guarantee in medicine is that nothing is guaranteed. It is important to note that the decision to proceed with this intervention was based on the information collected from the patient. The Data and conclusions were drawn from the patient's questionnaire, the interview, and the physical examination. Because the information was provided in large part by the patient, it cannot be guaranteed that it has not been purposely or  unconsciously manipulated. Every effort has been made to obtain as much relevant data as possible for this evaluation. It is important to note that the conclusions that lead to this procedure are derived in large part from the available data. Always take into account that the treatment will also be dependent  on availability of resources and existing treatment guidelines, considered by other Pain Management Practitioners as being common knowledge and practice, at the time of the intervention. For Medico-Legal purposes, it is also important to point out that variation in procedural techniques and pharmacological choices are the acceptable norm. The indications, contraindications, technique, and results of the above procedure should only be interpreted and judged by a Board-Certified Interventional Pain Specialist with extensive familiarity and expertise in the same exact procedure and technique.

## 2023-01-11 ENCOUNTER — Other Ambulatory Visit (HOSPITAL_COMMUNITY): Payer: Self-pay

## 2023-01-11 ENCOUNTER — Ambulatory Visit
Admission: RE | Admit: 2023-01-11 | Discharge: 2023-01-11 | Disposition: A | Discharge status: Home / Self Care | Payer: 59 | Source: Ambulatory Visit | Attending: Pain Medicine | Admitting: Pain Medicine

## 2023-01-11 ENCOUNTER — Other Ambulatory Visit: Payer: Self-pay | Admitting: Family Medicine

## 2023-01-11 ENCOUNTER — Ambulatory Visit: Payer: 59 | Attending: Pain Medicine | Admitting: Pain Medicine

## 2023-01-11 ENCOUNTER — Encounter: Payer: Self-pay | Admitting: Pain Medicine

## 2023-01-11 VITALS — BP 122/85 | HR 62 | Temp 97.2°F | Resp 15 | Ht 62.0 in | Wt 175.0 lb

## 2023-01-11 DIAGNOSIS — M4310 Spondylolisthesis, site unspecified: Secondary | ICD-10-CM | POA: Insufficient documentation

## 2023-01-11 DIAGNOSIS — M47812 Spondylosis without myelopathy or radiculopathy, cervical region: Secondary | ICD-10-CM | POA: Diagnosis not present

## 2023-01-11 DIAGNOSIS — G4486 Cervicogenic headache: Secondary | ICD-10-CM | POA: Diagnosis not present

## 2023-01-11 DIAGNOSIS — M5030 Other cervical disc degeneration, unspecified cervical region: Secondary | ICD-10-CM | POA: Diagnosis not present

## 2023-01-11 DIAGNOSIS — M79602 Pain in left arm: Secondary | ICD-10-CM | POA: Insufficient documentation

## 2023-01-11 DIAGNOSIS — M5412 Radiculopathy, cervical region: Secondary | ICD-10-CM | POA: Diagnosis not present

## 2023-01-11 DIAGNOSIS — M4692 Unspecified inflammatory spondylopathy, cervical region: Secondary | ICD-10-CM | POA: Diagnosis not present

## 2023-01-11 DIAGNOSIS — M542 Cervicalgia: Secondary | ICD-10-CM | POA: Diagnosis not present

## 2023-01-11 DIAGNOSIS — M4802 Spinal stenosis, cervical region: Secondary | ICD-10-CM | POA: Insufficient documentation

## 2023-01-11 DIAGNOSIS — G8929 Other chronic pain: Secondary | ICD-10-CM | POA: Diagnosis present

## 2023-01-11 DIAGNOSIS — M5481 Occipital neuralgia: Secondary | ICD-10-CM | POA: Diagnosis not present

## 2023-01-11 DIAGNOSIS — M79601 Pain in right arm: Secondary | ICD-10-CM | POA: Insufficient documentation

## 2023-01-11 MED ORDER — FENTANYL CITRATE (PF) 100 MCG/2ML IJ SOLN
25.0000 ug | INTRAMUSCULAR | Status: DC | PRN
  Administered 2023-01-11: 50 ug via INTRAVENOUS

## 2023-01-11 MED ORDER — DEXAMETHASONE SODIUM PHOSPHATE 10 MG/ML IJ SOLN
INTRAMUSCULAR | Status: AC
  Filled 2023-01-11: qty 1

## 2023-01-11 MED ORDER — SODIUM CHLORIDE 0.9% FLUSH
1.0000 mL | Freq: Once | INTRAVENOUS | Status: AC
  Administered 2023-01-11: 1 mL

## 2023-01-11 MED ORDER — DEXAMETHASONE SODIUM PHOSPHATE 10 MG/ML IJ SOLN
10.0000 mg | Freq: Once | INTRAMUSCULAR | Status: AC
  Administered 2023-01-11: 10 mg

## 2023-01-11 MED ORDER — MIDAZOLAM HCL 5 MG/5ML IJ SOLN
INTRAMUSCULAR | Status: AC
  Filled 2023-01-11: qty 5

## 2023-01-11 MED ORDER — LIDOCAINE HCL 2 % IJ SOLN
20.0000 mL | Freq: Once | INTRAMUSCULAR | Status: AC
  Administered 2023-01-11: 400 mg

## 2023-01-11 MED ORDER — MIDAZOLAM HCL 5 MG/5ML IJ SOLN
0.5000 mg | Freq: Once | INTRAMUSCULAR | Status: AC
  Administered 2023-01-11: 2 mg via INTRAVENOUS

## 2023-01-11 MED ORDER — LACTATED RINGERS IV SOLN: Freq: Once | INTRAVENOUS | Status: AC

## 2023-01-11 MED ORDER — SODIUM CHLORIDE (PF) 0.9 % IJ SOLN
INTRAMUSCULAR | Status: AC
  Filled 2023-01-11: qty 10

## 2023-01-11 MED ORDER — IOHEXOL 180 MG/ML  SOLN
INTRAMUSCULAR | Status: AC
  Filled 2023-01-11: qty 10

## 2023-01-11 MED ORDER — PENTAFLUOROPROP-TETRAFLUOROETH EX AERO
INHALATION_SPRAY | Freq: Once | CUTANEOUS | Status: AC
  Administered 2023-01-11: 30 via TOPICAL
  Filled 2023-01-11: qty 116

## 2023-01-11 MED ORDER — ROPIVACAINE HCL 2 MG/ML IJ SOLN
1.0000 mL | Freq: Once | INTRAMUSCULAR | Status: AC
  Administered 2023-01-11: 1 mL via EPIDURAL

## 2023-01-11 MED ORDER — FENTANYL CITRATE (PF) 100 MCG/2ML IJ SOLN
INTRAMUSCULAR | Status: AC
  Filled 2023-01-11: qty 2

## 2023-01-11 MED ORDER — LIDOCAINE HCL 2 % IJ SOLN
INTRAMUSCULAR | Status: AC
  Filled 2023-01-11: qty 20

## 2023-01-11 NOTE — Progress Notes (Signed)
Safety precautions to be maintained throughout the outpatient stay will include: orient to surroundings, keep bed in low position, maintain call bell within reach at all times, provide assistance with transfer out of bed and ambulation.  

## 2023-01-11 NOTE — Patient Instructions (Signed)

## 2023-01-12 ENCOUNTER — Telehealth: Payer: Self-pay

## 2023-01-12 ENCOUNTER — Other Ambulatory Visit (HOSPITAL_COMMUNITY): Payer: Self-pay

## 2023-01-12 ENCOUNTER — Other Ambulatory Visit: Payer: Self-pay

## 2023-01-12 MED ORDER — BUPROPION HCL ER (XL) 300 MG PO TB24
300.0000 mg | ORAL_TABLET | Freq: Every day | ORAL | 0 refills | Status: DC
  Filled 2023-01-12 (×2): qty 90, 90d supply, fill #0

## 2023-01-12 NOTE — Telephone Encounter (Signed)
Post procedure follow up.  Voicemail has not been set up.  Unable to leave message.  

## 2023-01-12 NOTE — Telephone Encounter (Signed)
Requested Prescriptions  Pending Prescriptions Disp Refills   buPROPion (WELLBUTRIN XL) 300 MG 24 hr tablet 90 tablet 1    Sig: Take 1 tablet (300 mg total) by mouth daily.     Psychiatry: Antidepressants - bupropion Failed - 01/11/2023  4:07 PM      Failed - ALT in normal range and within 360 days    ALT  Date Value Ref Range Status  11/02/2022 56 (H) 0 - 32 IU/L Final         Passed - Cr in normal range and within 360 days    Creatinine, Ser  Date Value Ref Range Status  01/02/2023 0.94 0.44 - 1.00 mg/dL Final         Passed - AST in normal range and within 360 days    AST  Date Value Ref Range Status  11/02/2022 39 0 - 40 IU/L Final         Passed - Completed PHQ-2 or PHQ-9 in the last 360 days      Passed - Last BP in normal range    BP Readings from Last 1 Encounters:  01/11/23 122/85         Passed - Valid encounter within last 6 months    Recent Outpatient Visits           2 months ago Encounter for annual physical exam   [facility] [place], Marzella Schlein, MD   7 months ago Urethra disorder   [facility] [place], Marzella Schlein, MD   9 months ago Benign essential hypertension   [facility] [place], Marzella Schlein, MD   1 year ago Encounter for annual physical exam   [facility] [place], Marzella Schlein, MD   1 year ago Benign essential hypertension   [facility] [place], Marzella Schlein, MD       Future Appointments             In 3 months [place], Marzella Schlein, MD [facility]

## 2023-01-14 ENCOUNTER — Encounter: Payer: Self-pay | Admitting: Family Medicine

## 2023-01-15 NOTE — Telephone Encounter (Signed)
Patient needs evaluation - virtual or in person

## 2023-01-16 ENCOUNTER — Other Ambulatory Visit: Payer: Self-pay

## 2023-01-16 ENCOUNTER — Ambulatory Visit
Admission: RE | Admit: 2023-01-16 | Discharge: 2023-01-16 | Disposition: A | Discharge status: Home / Self Care | Payer: 59 | Source: Ambulatory Visit | Attending: Physician Assistant | Admitting: Physician Assistant

## 2023-01-16 ENCOUNTER — Ambulatory Visit (INDEPENDENT_AMBULATORY_CARE_PROVIDER_SITE_OTHER): Payer: 59

## 2023-01-16 VITALS — BP 137/94 | HR 105 | Temp 98.6°F | Resp 17 | Wt 173.0 lb

## 2023-01-16 DIAGNOSIS — G8929 Other chronic pain: Secondary | ICD-10-CM | POA: Diagnosis not present

## 2023-01-16 DIAGNOSIS — M25511 Pain in right shoulder: Secondary | ICD-10-CM

## 2023-01-16 DIAGNOSIS — M79641 Pain in right hand: Secondary | ICD-10-CM

## 2023-01-16 DIAGNOSIS — M542 Cervicalgia: Secondary | ICD-10-CM | POA: Diagnosis not present

## 2023-01-16 DIAGNOSIS — M778 Other enthesopathies, not elsewhere classified: Secondary | ICD-10-CM | POA: Diagnosis not present

## 2023-01-16 MED ORDER — PREDNISONE 10 MG PO TABS
ORAL_TABLET | ORAL | 0 refills | Status: AC
  Filled 2023-01-16: qty 21, 6d supply, fill #0

## 2023-01-16 MED ORDER — HYDROCODONE-ACETAMINOPHEN 5-325 MG PO TABS
1.0000 | ORAL_TABLET | Freq: Four times a day (QID) | ORAL | 0 refills | Status: AC | PRN
  Filled 2023-01-16: qty 12, 3d supply, fill #0

## 2023-01-16 NOTE — Discharge Instructions (Signed)
-  The x-rays are negative. - You likely have tendinitis of your thumb.  We have given you a splint/brace.  Wear this.  He may take the hand out to ice it. - I sent prednisone taper to the pharmacy and something for pain relief. - Your shoulder pain is likely related to your chronic neck condition.  You may need to follow-up with Ortho if no improvement over the next week.   You have a condition requiring you to follow up with Orthopedics so please call one of the following office for appointment:   [facility] Address: [address] [place], [place] [address] Phone: [phone]  [facility] [address] [place], [place] [address] Phone: [phone]  [facility] [address] [place], [place] [address] Phone: [phone]

## 2023-01-16 NOTE — ED Triage Notes (Signed)
Last week I tripped down the last step of garage stairs, twisted my ankle, scraped off some skin on rt thumb knuckle. It got really infected so I tried to treat it with Bacitracin/triple antibiotic ointment and hydrogen peroxide. Pain rt hand/armcont

## 2023-01-16 NOTE — ED Provider Notes (Signed)
[facility]    CSN: [id number] Arrival date & time: 01/16/23  0900      History   Chief Complaint Chief Complaint  Patient presents with   Shoulder Pain    Last week I tripped down the last step of garage stairs, twisted my ankle, scraped off some skin on rt thumb knuckle. It got really infected so I tried to treat it with Bacitracin/triple antibiotic ointment and hydrogen peroxide. Pain rt hand/armcont - Entered by patient   Fall    HPI Elizabeth Bowers is a 57 y.o. female presenting for multiple complaints.  Patient states that she fell over a week ago and to visit her ankle, suffered a skin avulsion of the right dorsal thumb and injured her hand.  She reports it seemed to be infected so she used hydrogen peroxide, bacitracin and triple antibiotic ointment and that seemed to clear the infection but she has continued to have pain at the the IP joint of the right thumb.  Healing wound.  Painful grasping with right hand. States she dropped a gallon of milk the other day. She also reports over the past 2 days she started to have right shoulder pain but denies any injury to the shoulder.  States she has reduced range of motion of the shoulder.  Reports limited pain of the right trapezius and discomfort in her neck.  States she has chronic neck pain and arthritis in her neck.  Unsure of any arthritis in her shoulder.  She does fear potential underlying fracture of the hand or deepening infection.  Medical history significant for chronic neck pain related to degenerative disc disease, foraminal stenosis, facet arthropathy.  Also has history of chronic back pain, right sided lumbar radiculopathy, depression, insomnia, hypertension, carpal tunnel syndrome bilaterally, overactive bladder, chronic hip pain, CKD stage III, and chronic sinusitis.   HPI  Past Medical History:  Diagnosis Date   Fatty liver    Fibromyalgia    GERD (gastroesophageal reflux disease)    Hemorrhoids    Hot  flashes    Hypertension    IBS (irritable bowel syndrome)    LUQ abdominal pain 10/25/2016   Migraines    Miscarriage    x 2; 4 live births    Multilevel degenerative disc disease    Neuroma    Vocal cord dysfunction     Patient Active Problem List   Diagnosis Date Noted   History of colonic polyps 01/01/2023   OAB (overactive bladder) 10/26/2022   Cervical radiculopathy at C6 (Right) 04/04/2022   Chronic hip pain (Right) 08/16/2021   Impaired range of motion of hip (Right) 08/16/2021   Chronic lower extremity pain (Right) 08/16/2021   Lower extremity radicular pain (Right) 08/16/2021   Lumbar radiculitis (L3/L4) (Right) 08/16/2021   Morton's neuroma of foot (Left) 11/15/2020   MDD (major depressive disorder) 10/01/2020   Anterior tibialis tendinitis, right 04/28/2020   Insomnia 07/07/2019   Benign essential hypertension 04/22/2019   Radial styloid tenosynovitis of left hand 01/13/2019   Pain in right hand 01/13/2019   Bilateral carpal tunnel syndrome 01/13/2019   Chronic pain of left wrist 01/13/2019   Chronic low back pain (Bilateral) w/o sciatica 08/08/2018   Abnormal MRI, cervical spine (07/10/2018) 07/17/2018   Cervical spondylitis with radiculitis (HCC) (C6) (Bilateral) (R>L) 06/26/2018   Chronic sacroiliac joint pain (Bilateral) (R>L) 06/26/2018   Cervical Grade 1 (2 mm) Retrolisthesis C5 over C6 03/20/2018   Cervical foraminal stenosis (C3-4 & C5-6) (Bilateral) 03/20/2018  DDD (degenerative disc disease), cervical 03/20/2018   Stage 3 chronic kidney disease (HCC) 03/20/2018   Acid reflux disease 03/20/2018   Spondylosis without myelopathy or radiculopathy, cervical region 03/20/2018   Chronic hip pain (Bilateral) (L>R) 03/20/2018   Lumbar facet syndrome (Bilateral) (R>L) 03/20/2018   Spondylosis without myelopathy or radiculopathy, lumbar region 03/20/2018   Chronic Sacroiliac joint dysfunction (Bilateral) (R>L) 03/20/2018   Chronic Somatic dysfunction of  sacroiliac joint (Bilateral) (R>L) 03/20/2018   Osteoarthritis of hip (Bilateral) 03/20/2018   Cervicogenic headache (Bilateral) 03/20/2018   Cervicalgia (Bilateral) (R>L) 03/20/2018   Cervical facet syndrome (Bilateral) (R>L) 03/20/2018   Chronic occipital neuralgia (Bilateral) 03/20/2018   Chronic upper extremity pain (4th area of Pain) (Bilateral) (R>L) 03/19/2018   Ganglion cyst 02/20/2018   Chronic low back pain (2ry area of Pain) (Bilateral) (L>R) w/ sciatica (Bilateral) 01/16/2018   Chronic lower extremity pain (3ry area of Pain) (Bilateral) (L>R) 01/16/2018   Chronic neck pain (1ry area of Pain) (Bilateral) (midline) 01/16/2018   Chronic foot pain (Left) 01/16/2018   Chronic pain syndrome 01/16/2018   Disorder of skeletal system 01/16/2018   Class 1 obesity with serious comorbidity and body mass index (BMI) of 32.0 to 32.9 in adult 11/21/2017   Anxiety 09/28/2017   Chronic knee pain (Right) 09/28/2017   Fatty liver 07/09/2017   Colon polyps 07/09/2017   Fibromyalgia 07/09/2017   Sinusitis, chronic 07/09/2017   Allergic rhinitis 07/09/2017   IBS (irritable bowel syndrome) 07/09/2017   Eyelid dermatitis, allergic/contact 07/09/2017   Chronic sacroiliac joint pain (Right) 12/16/2016   DDD (degenerative disc disease), lumbar 07/18/2016   Family hx of colon cancer 07/18/2016    Past Surgical History:  Procedure Laterality Date   ABDOMINAL HYSTERECTOMY     2012 removed cervix h/o abnormal pap    CHOLECYSTECTOMY     2000   COLONOSCOPY     2018 with + polpys and GIB 2/2 polyp removal    COLONOSCOPY WITH PROPOFOL N/A 12/22/2019   Procedure: COLONOSCOPY WITH PROPOFOL;  Surgeon: Toney Reil, MD;  Location: [facility] ENDOSCOPY;  Service: Gastroenterology;  Laterality: N/A;   COLONOSCOPY WITH PROPOFOL N/A 12/23/2019   Procedure: COLONOSCOPY WITH PROPOFOL;  Surgeon: Toney Reil, MD;  Location: [facility] ENDOSCOPY;  Service: Gastroenterology;  Laterality: N/A;  Last name  pronounced LEE-MA   COLONOSCOPY WITH PROPOFOL N/A 01/01/2023   Procedure: COLONOSCOPY WITH PROPOFOL;  Surgeon: Toney Reil, MD;  Location: [facility] ENDOSCOPY;  Service: Gastroenterology;  Laterality: N/A;   ELBOW DEBRIDEMENT     ESOPHAGOGASTRODUODENOSCOPY (EGD) WITH PROPOFOL N/A 02/06/2018   Procedure: ESOPHAGOGASTRODUODENOSCOPY (EGD) WITH PROPOFOL with biopsies;  Surgeon: Toney Reil, MD;  Location: [facility] SURGERY CNTR;  Service: Endoscopy;  Laterality: N/A;   ETHMOIDECTOMY N/A 09/28/2021   Procedure: TOTAL ETHMOIDECTOMY;  Surgeon: Bud Face, MD;  Location: [facility] SURGERY CNTR;  Service: ENT;  Laterality: N/A;   FOOT SURGERY     IMAGE GUIDED SINUS SURGERY N/A 09/28/2021   Procedure: IMAGE GUIDED SINUS SURGERY;  Surgeon: Bud Face, MD;  Location: [facility] SURGERY [facility]TR;  Service: ENT;  Laterality: N/A;  PLACED DISK ON OR CHARGE NURSE DESK 4-21  KP   MAXILLARY ANTROSTOMY Bilateral 09/28/2021   Procedure: MAXILLARY ANTROSTOMY WITHOUT TISSUE REMOVAL;  Surgeon: Bud Face, MD;  Location: [facility] SURGERY CNTR;  Service: ENT;  Laterality: Bilateral;   POLYPECTOMY  01/01/2023   Procedure: POLYPECTOMY;  Surgeon: Toney Reil, MD;  Location: [facility] ENDOSCOPY;  Service: Gastroenterology;;   Selina Cooley Bilateral 09/28/2021   Procedure:  SPHENOIDOTOMY;  Surgeon: Bud Face, MD;  Location: [facility]ERY [facility];  Service: ENT;  Laterality: Bilateral;    OB History   No obstetric history on file.      Home Medications    Prior to Admission medications   Medication Sig Start Date End Date Taking? Authorizing Provider  amLODipine (NORVASC) 10 MG tablet Take 1 tablet (10 mg) by mouth daily. 10/02/22  Yes Bacigalupo, Marzella Schlein, MD  BUDESONIDE IN ADD (SQUEEZE 0.5ML TWICE=1ML DOSE) OF MEDICATION TO OF SALINE IN SALINE IRRIGATION BOTTLE; IRRIGATE SINUSES WITH THROUGH EACH NOSTRIL TWICE DAILY 08/29/22  Yes [provider]  buPROPion (WELLBUTRIN XL)  300 MG 24 hr tablet Take 1 tablet (300 mg total) by mouth daily. 01/12/23  Yes Bacigalupo, Marzella Schlein, MD  DULoxetine (CYMBALTA) 60 MG capsule Take 1 capsule (60 mg total) by mouth daily. 11/08/22  Yes Bacigalupo, Marzella Schlein, MD  HYDROcodone-acetaminophen (NORCO) 5-325 MG tablet Take 1 tablet by mouth every 6 (six) hours as needed for up to 3 days for moderate pain or severe pain. 01/16/23 01/19/23 Yes Shirlee Latch, PA-C  lansoprazole (PREVACID) 30 MG capsule Take 1 capsule (30 mg total) by mouth 2 (two) times daily before a meal. 09/05/22  Yes Bacigalupo, Marzella Schlein, MD  LINZESS 290 MCG CAPS capsule Take 1 capsule (290 mcg total) by mouth daily. 12/28/22  Yes Bacigalupo, Marzella Schlein, MD  methylPREDNISolone (MEDROL DOSEPAK) 4 MG TBPK tablet Follow package directions. 12/28/22  Yes   Multiple Vitamin (MULTIVITAMIN WITH MINERALS) TABS tablet Take 1 tablet by mouth daily.   Yes [provider]  ondansetron (ZOFRAN-ODT) 8 MG disintegrating tablet Take 1 tablet (8 mg total) by mouth every 8 (eight) hours as needed for nausea or vomiting. 11/27/22  Yes Brimage, Vondra, DO  oxybutynin (DITROPAN-XL) 5 MG 24 hr tablet Take 1 tablet (5 mg total) by mouth at bedtime. 10/26/22  Yes Bacigalupo, Marzella Schlein, MD  predniSONE (DELTASONE) 10 MG tablet Take 6 tablets (60 mg total) by mouth daily for 1 day, THEN 5 tablets (50 mg total) daily for 1 day, THEN 4 tablets (40 mg total) daily for 1 day, THEN 3 tablets (30 mg total) daily for 1 day, THEN 2 tablets (20 mg total) daily for 1 day, THEN 1 tablet (10 mg total) daily for 1 day. 01/16/23 01/22/23 Yes Shirlee Latch, PA-C  triamterene-hydrochlorothiazide (MAXZIDE-25) 37.5-25 MG tablet Take 1 tablet by mouth daily. 11/17/22  Yes Bacigalupo, Marzella Schlein, MD  Ubrogepant (UBRELVY) 50 MG TABS Take 1 tablet (50 mg total) by mouth once as needed for up to 1 dose. Take 50 mg as a single dose; if symptoms persist or return, may repeat dose after 2 hours. Maximum: 200 mg per 24 hours. 12/28/22  Yes      Family History Family History  Problem Relation Age of Onset   Hypertension Mother    Hyperlipidemia Mother    Glaucoma Mother    Diabetes Mother        type 2    COPD Father        emphysema   Alcohol abuse Father    Colon cancer Sister    Cancer Sister        rectal/colon cancer no h/o IBD   Colon polyps Sister    Asthma Sister    COPD Sister    Kidney disease Sister    Mental illness Sister        bipolar    Miscarriages /  Stillbirths Sister    Uterine cancer Sister    Alcohol abuse Son    Depression Son    Diabetes Son    Drug abuse Son    Mental illness Son        bipolar    Diabetes Maternal Grandmother    COPD Paternal Grandmother    Cancer Paternal Grandfather        FH colon cancer maternal aunts/uncles    Miscarriages / Stillbirths Sister    Arthritis Son    Arthritis Son    Breast cancer Neg Hx     Social History Social History   Tobacco Use   Smoking status: Former    Current packs/day: 0.00    Average packs/day: 0.8 packs/day for 5.0 years (3.8 ttl pk-yrs)    Types: Cigarettes    Start date: 07/05/1982    Quit date: 07/06/1987    Years since quitting: 35.5   Smokeless tobacco: Never  Vaping Use   Vaping status: Never Used  Substance Use Topics   Alcohol use: Yes    Alcohol/week: 2.0 - 4.0 standard drinks of alcohol    Types: 2 - 4 Cans of beer per week    Comment: wine cooler   Drug use: Never     Allergies   Gluten meal, Lactose, Ranitidine, Amphetamine-dextroamphetamine, Lactose intolerance (gi), Reglan [metoclopramide], Topiramate er, Wheat, Soap, and Sulfa antibiotics   Review of Systems Review of Systems  Constitutional:  Negative for fever.  Musculoskeletal:  Positive for arthralgias, back pain (chronic) and neck pain (chronic). Negative for gait problem and joint swelling.  Skin:  Positive for wound. Negative for color change.  Neurological:  Negative for weakness and numbness.     Physical Exam Triage Vital Signs ED  Triage Vitals  Encounter Vitals Group     BP 01/16/23 0910 (!) 137/94     Systolic BP Percentile --      Diastolic BP Percentile --      Pulse Rate 01/16/23 0910 (!) 105     Resp 01/16/23 0910 17     Temp 01/16/23 0910 98.6 F (37 C)     Temp Source 01/16/23 0910 Oral     SpO2 01/16/23 0910 98 %     Weight 01/16/23 0907 173 lb (78.5 kg)     Height --      Head Circumference --      Peak Flow --      Pain Score 01/16/23 0907 8     Pain Loc --      Pain Education --      Exclude from Growth Chart --    No data found.  Updated Vital Signs BP (!) 137/94 (BP Location: Left Arm)   Pulse (!) 105   Temp 98.6 F (37 C) (Oral)   Resp 17   Wt 173 lb (78.5 kg)   SpO2 98%   BMI 31.64 kg/m      Physical Exam Vitals and nursing note reviewed.  Constitutional:      General: She is not in acute distress.    Appearance: Normal appearance. She is not ill-appearing or toxic-appearing.  HENT:     Head: Normocephalic and atraumatic.  Eyes:     General: No scleral icterus.       Right eye: No discharge.        Left eye: No discharge.     Conjunctiva/sclera: Conjunctivae normal.  Cardiovascular:     Rate and Rhythm: Regular rhythm. Tachycardia present.  Pulses: Normal pulses.     Heart sounds: Normal heart sounds.  Pulmonary:     Effort: Pulmonary effort is normal. No respiratory distress.     Breath sounds: Normal breath sounds.  Musculoskeletal:     Right shoulder: Tenderness present. No swelling or deformity. Decreased range of motion (decreased to 90 degrees flexion and abduction). Decreased strength: right AC joint, lateral shoulder, right trap and biceps groove.     Right hand: Swelling and bony tenderness (IP joint thumb) present. Decreased range of motion (right thumb). Normal pulse.     Cervical back: Normal range of motion and neck supple. Tenderness present.     Comments: Painful movement of right thumb  Skin:    General: Skin is dry.     Findings: Lesion (healing  avulsion of skin of right thumb. No drainage or erythema) present.  Neurological:     General: No focal deficit present.     Mental Status: She is alert. Mental status is at baseline.     Motor: No weakness.     Gait: Gait normal.  Psychiatric:        Mood and Affect: Mood normal.        Behavior: Behavior normal.        Thought Content: Thought content normal.      UC Treatments / Results  Labs (all labs ordered are listed, but only abnormal results are displayed) Labs Reviewed - No data to display  EKG   Radiology DG Shoulder Right  Result Date: 01/16/2023 CLINICAL DATA:  Right shoulder pain for 2 days, no stated injury EXAM: RIGHT SHOULDER - 2+ VIEW COMPARISON:  None Available. FINDINGS: There is no evidence of fracture or dislocation. There is no evidence of arthropathy or other focal bone abnormality. Soft tissues are unremarkable. IMPRESSION: No fracture or dislocation of the right shoulder. Joint spaces are preserved. Electronically Signed   By: Jearld Lesch M.D.   On: 01/16/2023 10:55   DG Hand Complete Right  Result Date: 01/16/2023 CLINICAL DATA:  Fall onto right hand and thumb 1 week ago, pain EXAM: RIGHT HAND - COMPLETE 3+ VIEW COMPARISON:  None Available. FINDINGS: There is no evidence of fracture or dislocation. There is no evidence of arthropathy or other focal bone abnormality. Soft tissue edema of the thumb. No radiopaque foreign body. IMPRESSION: 1. No fracture or dislocation of the right hand. Joint spaces are preserved. 2.  Soft tissue edema of the thumb.  No radiopaque foreign body. Electronically Signed   By: Jearld Lesch M.D.   On: 01/16/2023 10:53    Procedures Procedures (including critical care time)  Medications Ordered in UC Medications - No data to display  Initial Impression / Assessment and Plan / UC Course  I have reviewed the triage vital signs and the nursing notes.  Pertinent labs & imaging results that were available during my care of the  patient were reviewed by me and considered in my medical decision making (see chart for details).   57 year old female presents for multiple complaints.  Primary complaint is pain and weakness of the right thumb.  Reports a fall and injury of this thumb about a week to week and a half ago.  Has a wound of the thumb which has nearly old but she feels that it was infected last week.  Also reporting right shoulder pain x 2 days.  No trauma or fall onto the shoulder.  History of chronic neck pain.  Is having some neck discomfort  at this time.  On exam she has a healing avulsion wound of the right dorsal thumb.  Reduced range of motion of the thumb and painful range of motion of the thumb.  Tenderness of the IP joint.  Tenderness of multiple sites of the right shoulder and reduced range of motion to about 90 degrees flexion and abduction.  Tenderness of the right trapezius and right paracervical muscles.  Full range of motion of neck.  X-ray of right hand and right shoulder obtained today.  X-rays without any acute bony abnormality.  Reviewed results with patient.  Suspect tendinitis of her thumb related to the injury.  Suspect her right shoulder pain is related to her chronic neck pain.  Since patient cannot take NSAIDs we will prescribe prednisone taper.  She request something for pain relief to help her sleep at night.  Sent short supply of Norco after reviewing controlled substance database.  Reviewed RICE guidelines.  She was given a thumb spica brace.  Advised to follow-up with orthopedics especially if no improvement in condition over the next week or symptoms worsen.  Final Clinical Impressions(s) / UC Diagnoses   Final diagnoses:  Tendinitis of thumb  Acute pain of right shoulder  Chronic neck pain     Discharge Instructions      -The x-rays are negative. - You likely have tendinitis of your thumb.  We have given you a splint/brace.  Wear this.  He may take the hand out to ice it. - I  sent prednisone taper to the pharmacy and something for pain relief. - Your shoulder pain is likely related to your chronic neck condition.  You may need to follow-up with Ortho if no improvement over the next week.   You have a condition requiring you to follow up with Orthopedics so please call one of the following office for appointment:   [facility] Address: [address], [place], [place] [address] Phone: [phone]  [facility] [address] [place], [place] [address] Phone: [phone]  [facility] [address] [place], [place] [address] Phone: [phone]      ED Prescriptions     Medication Sig Dispense Auth. Provider   predniSONE (DELTASONE) 10 MG tablet Take 6 tablets (60 mg total) by mouth daily for 1 day, THEN 5 tablets (50 mg total) daily for 1 day, THEN 4 tablets (40 mg total) daily for 1 day, THEN 3 tablets (30 mg total) daily for 1 day, THEN 2 tablets (20 mg total) daily for 1 day, THEN 1 tablet (10 mg total) daily for 1 day. 21 tablet Eusebio Friendly B, PA-C   HYDROcodone-acetaminophen (NORCO) 5-325 MG tablet Take 1 tablet by mouth every 6 (six) hours as needed for up to 3 days for moderate pain or severe pain. 12 tablet Shirlee Latch, PA-C      I have reviewed the PDMP during this encounter.   Shirlee Latch, PA-C 01/16/23 1120

## 2023-02-01 ENCOUNTER — Ambulatory Visit: Payer: 59 | Attending: Pain Medicine | Admitting: Pain Medicine

## 2023-02-01 ENCOUNTER — Encounter: Payer: Self-pay | Admitting: Pain Medicine

## 2023-02-01 VITALS — BP 128/84 | HR 92 | Temp 98.0°F | Ht 62.0 in | Wt 170.0 lb

## 2023-02-01 DIAGNOSIS — Z09 Encounter for follow-up examination after completed treatment for conditions other than malignant neoplasm: Secondary | ICD-10-CM | POA: Insufficient documentation

## 2023-02-01 DIAGNOSIS — M79601 Pain in right arm: Secondary | ICD-10-CM | POA: Insufficient documentation

## 2023-02-01 DIAGNOSIS — M5412 Radiculopathy, cervical region: Secondary | ICD-10-CM | POA: Insufficient documentation

## 2023-02-01 DIAGNOSIS — G8929 Other chronic pain: Secondary | ICD-10-CM | POA: Diagnosis not present

## 2023-02-01 DIAGNOSIS — M79602 Pain in left arm: Secondary | ICD-10-CM | POA: Insufficient documentation

## 2023-02-01 DIAGNOSIS — M542 Cervicalgia: Secondary | ICD-10-CM | POA: Diagnosis not present

## 2023-02-01 DIAGNOSIS — G4486 Cervicogenic headache: Secondary | ICD-10-CM | POA: Diagnosis not present

## 2023-02-01 NOTE — Progress Notes (Signed)
Safety precautions to be maintained throughout the outpatient stay will include: orient to surroundings, keep bed in low position, maintain call bell within reach at all times, provide assistance with transfer out of bed and ambulation.  

## 2023-02-01 NOTE — Progress Notes (Signed)
PROVIDER NOTE: Information contained herein reflects review and annotations entered in association with encounter. Interpretation of such information and data should be left to medically-trained personnel. Information provided to patient can be located elsewhere in the medical record under "Patient Instructions". Document created using STT-dictation technology, any transcriptional errors that may result from process are unintentional.    Patient: Elizabeth Bowers  Service Category: E/M  Provider: Oswaldo Done, MD  DOB: [birth year]  DOS: 02/01/2023  Referring Provider: Erasmo Downer, MD  MRN: [id number]  Specialty: Interventional Pain Management  PCP: Erasmo Downer, MD  Type: Established Patient  Setting: Ambulatory outpatient    Location: Office  Delivery: Face-to-face     HPI  Elizabeth Bowers, a 57 y.o. year old female, is here today because of her Cervicogenic headache [G44.86]. Elizabeth Bowers primary complain today is generalized pain  Pertinent problems: Elizabeth Bowers has Chronic sacroiliac joint pain (Right); Fibromyalgia; Chronic knee pain (Right); DDD (degenerative disc disease), lumbar; Chronic low back pain (2ry area of Pain) (Bilateral) (L>R) w/ sciatica (Bilateral); Chronic lower extremity pain (3ry area of Pain) (Bilateral) (L>R); Chronic neck pain (1ry area of Pain) (Bilateral) (midline); Chronic foot pain (Left); Chronic pain syndrome; Ganglion cyst; Chronic upper extremity pain (4th area of Pain) (Bilateral) (R>L); Cervical Grade 1 (2 mm) Retrolisthesis C5 over C6; Cervical foraminal stenosis (C3-4 & C5-6) (Bilateral); DDD (degenerative disc disease), cervical; Spondylosis without myelopathy or radiculopathy, cervical region; Chronic hip pain (Bilateral) (L>R); Lumbar facet syndrome (Bilateral) (R>L); Spondylosis without myelopathy or radiculopathy, lumbar region; Chronic Sacroiliac joint dysfunction (Bilateral) (R>L); Chronic Somatic dysfunction of sacroiliac joint (Bilateral)  (R>L); Osteoarthritis of hip (Bilateral); Cervicogenic headache (Bilateral); Cervicalgia (Bilateral) (R>L); Cervical facet syndrome (Bilateral) (R>L); Chronic occipital neuralgia (Bilateral); Cervical spondylitis with radiculitis (HCC) (C6) (Bilateral) (R>L); Chronic sacroiliac joint pain (Bilateral) (R>L); Abnormal MRI, cervical spine (07/10/2018); Chronic low back pain (Bilateral) w/o sciatica; Anterior tibialis tendinitis, right; Radial styloid tenosynovitis of left hand; Pain in right hand; Bilateral carpal tunnel syndrome; Chronic pain of left wrist; Morton's neuroma of foot (Left); Chronic hip pain (Right); Impaired range of motion of hip (Right); Chronic lower extremity pain (Right); Lower extremity radicular pain (Right); Lumbar radiculitis (L3/L4) (Right); and Cervical radiculopathy at C6 (Right) on their pertinent problem list. Pain Assessment: Severity of Chronic pain is reported as a 1 /10. Location: Generalized  /denies. Onset: More than a month ago. Quality: Constant, Aching. Timing: Constant. Modifying factor(s): denies. Vitals:  height is 5\' 2"  (1.575 m) and weight is 170 lb (77.1 kg). Her temporal temperature is 98 F (36.7 C). Her blood pressure is 128/84 and her pulse is 92. Her oxygen saturation is 97%.  BMI: Estimated body mass index is 31.09 kg/m as calculated from the following:   Height as of this encounter: 5\' 2"  (1.575 m).   Weight as of this encounter: 170 lb (77.1 kg). Last encounter: 01/10/2023. Last procedure: 01/11/2023.  Reason for encounter: post-procedure evaluation and assessment. The patient indicates having attained 100% relief of the pain for the first hour followed by a decrease to 50% improvement that lasted for 4 to 6 hours.  After that, it continued to improved and right now she has a 100% ongoing relief of her cervicalgia and cervicogenic headaches.  Post-procedure evaluation   Procedure: Cervical Epidural Steroid injection (CESI) (Interlaminar) #3 (first of  2024) Laterality: Right  Level: C7-T1 Imaging: Fluoroscopy-assisted DOS: 01/11/2023  Performed by: Delano Metz, MD Anesthesia: Local anesthesia (1-2% Lidocaine) Anxiolysis: IV Versed 2.0 mg  Sedation: Moderate Sedation Fentanyl 1 mL (50 mcg)   Purpose: Diagnostic/Therapeutic Indications: Cervicalgia, cervical radicular pain, degenerative disc disease, severe enough to impact quality of life or function. 1. Cervicogenic headache (Bilateral)   2. Cervicalgia (Bilateral) (R>L)   3. DDD (degenerative disc disease), cervical   4. Chronic occipital neuralgia (Bilateral)   5. Cervical spondylitis with radiculitis (HCC) (C6) (Bilateral) (R>L)   6. Cervical radiculopathy at C6 (Right)   7. Cervical Grade 1 (2 mm) Retrolisthesis C5 over C6   8. Cervical foraminal stenosis (C3-4 & C5-6) (Bilateral)   9. Cervical facet syndrome (Bilateral) (R>L)   10. Chronic upper extremity pain (4th area of Pain) (Bilateral) (R>L)    NAS-11 score:   Pre-procedure: 4 /10   Post-procedure: 4 /10     Effectiveness:  Initial hour after procedure: 100 %. Subsequent 4-6 hours post-procedure: 50 %. Analgesia past initial 6 hours: 100 %. Ongoing improvement:  Analgesic: The patient indicates having attained 100% relief of the pain for the first hour followed by a decrease to 50% improvement that lasted for 4 to 6 hours.  After that, it continued to improved and right now she has a 100% ongoing relief of her cervicalgia and cervicogenic headaches. Function: Elizabeth Bowers reports improvement in function ROM: Elizabeth Bowers reports improvement in ROM  Pharmacotherapy Assessment  Analgesic: No chronic opioid analgesics therapy prescribed by our practice.   Monitoring: [place] PMP: PDMP reviewed during this encounter.       Pharmacotherapy: No side-effects or adverse reactions reported. Compliance: No problems identified. Effectiveness: Clinically acceptable.  Florina Ou, RN  02/01/2023 12:45 PM  Sign when Signing  Visit Safety precautions to be maintained throughout the outpatient stay will include: orient to surroundings, keep bed in low position, maintain call bell within reach at all times, provide assistance with transfer out of bed and ambulation.     No results found for: "CBDTHCR" No results found for: "D8THCCBX" No results found for: "D9THCCBX"  UDS:  Summary  Date Value Ref Range Status  01/16/2018 FINAL  Final    Comment:    ==================================================================== [facility] COMP DRUG ANALYSIS,UR ==================================================================== Test                             Result       Flag       Units Drug Present and Declared for Prescription Verification   Oxazepam                       51           EXPECTED   ng/mg creat   Temazepam                      44           EXPECTED   ng/mg creat    Oxazepam and temazepam are expected metabolites of diazepam.    Oxazepam is also an expected metabolite of other benzodiazepine    drugs, including chlordiazepoxide, prazepam, clorazepate,    halazepam, and temazepam.  Oxazepam and temazepam are available    as scheduled prescription medications.   Phentermine                    PRESENT      EXPECTED   Duloxetine  PRESENT      EXPECTED Drug Present not Declared for Prescription Verification   Topiramate                     PRESENT      UNEXPECTED   Acetaminophen                  PRESENT      UNEXPECTED Drug Absent but Declared for Prescription Verification   Cyclobenzaprine                Not Detected UNEXPECTED   Promethazine                   Not Detected UNEXPECTED ==================================================================== Test                      Result    Flag   Units      Ref Range   Creatinine              57               mg/dL      >=08 ==================================================================== Declared Medications:  The flagging and  interpretation on this report are based on the  following declared medications.  Unexpected results may arise from  inaccuracies in the declared medications.  **Note: The testing scope of this panel includes these medications:  Cyclobenzaprine  Diazepam  Duloxetine  Phentermine  Promethazine  **Note: The testing scope of this panel does not include following  reported medications:  Acyclovir  Budesonide  Docusate  Hydrochlorothiazide (Triamterine-Hydrochlorthzide)  Levocetirizine  Linaclotide  Meclizine  Montelukast  Multivitamin  Potassium  Triamterene (Triamterine-Hydrochlorthzide)  Valacyclovir ==================================================================== For clinical consultation, please call [phone]. ====================================================================       Bowers  Constitutional: Denies any fever or chills Gastrointestinal: No reported hemesis, hematochezia, vomiting, or acute GI distress Musculoskeletal: Denies any acute onset joint swelling, redness, loss of ROM, or weakness Neurological: No reported episodes of acute onset apraxia, aphasia, dysarthria, agnosia, amnesia, paralysis, loss of coordination, or loss of consciousness  Medication Review  Budesonide, DULoxetine, Ubrogepant, amLODipine, buPROPion, lansoprazole, linaclotide, methylPREDNISolone, multivitamin with minerals, ondansetron, oxybutynin, and triamterene-hydrochlorothiazide  History Review  Allergy: Elizabeth Bowers is allergic to gluten meal, lactose, ranitidine, amphetamine-dextroamphetamine, lactose intolerance (gi), reglan [metoclopramide], topiramate er, wheat, soap, and sulfa antibiotics. Drug: Elizabeth Bowers  reports no history of drug use. Alcohol:  reports current alcohol use of about 2.0 - 4.0 standard drinks of alcohol per week. Tobacco:  reports that she quit smoking about 35 years ago. Her smoking use included cigarettes. She started smoking about 40 years ago. She has a 3.8  pack-year smoking history. She has never used smokeless tobacco. Social: Elizabeth Bowers  reports that she quit smoking about 35 years ago. Her smoking use included cigarettes. She started smoking about 40 years ago. She has a 3.8 pack-year smoking history. She has never used smokeless tobacco. She reports current alcohol use of about 2.0 - 4.0 standard drinks of alcohol per week. She reports that she does not use drugs. Medical:  has a past medical history of Fatty liver, Fibromyalgia, GERD (gastroesophageal reflux disease), Hemorrhoids, Hot flashes, Hypertension, IBS (irritable bowel syndrome), LUQ abdominal pain (10/25/2016), Migraines, Miscarriage, Multilevel degenerative disc disease, Neuroma, and Vocal cord dysfunction. Surgical: Elizabeth Bowers  has a past surgical history that includes Elbow Debridement; Foot surgery; Cholecystectomy; Colonoscopy; Abdominal hysterectomy; Esophagogastroduodenoscopy (egd) with propofol (N/A, 02/06/2018); Colonoscopy with propofol (N/A, 12/22/2019); Colonoscopy  with propofol (N/A, 12/23/2019); Image guided sinus surgery (N/A, 09/28/2021); Maxillary antrostomy (Bilateral, 09/28/2021); Ethmoidectomy (N/A, 09/28/2021); Sphenoidectomy (Bilateral, 09/28/2021); Colonoscopy with propofol (N/A, 01/01/2023); and polypectomy (01/01/2023). Family: family history includes Alcohol abuse in her father and son; Arthritis in her son and son; Asthma in her sister; COPD in her father, paternal grandmother, and sister; Cancer in her paternal grandfather and sister; Colon cancer in her sister; Colon polyps in her sister; Depression in her son; Diabetes in her maternal grandmother, mother, and son; Drug abuse in her son; Glaucoma in her mother; Hyperlipidemia in her mother; Hypertension in her mother; Kidney disease in her sister; Mental illness in her sister and son; Miscarriages / Stillbirths in her sister and sister; Uterine cancer in her sister.  Laboratory Chemistry Profile   Renal Lab Results  Component  Value Date   BUN 18 01/02/2023   CREATININE 0.94 01/02/2023   BCR 13 11/02/2022   GFRAA >60 07/07/2019   GFRNONAA >60 01/02/2023    Hepatic Lab Results  Component Value Date   AST 39 11/02/2022   ALT 56 (H) 11/02/2022   ALBUMIN 4.4 11/02/2022   ALKPHOS 100 11/02/2022   LIPASE 33 12/15/2016    Electrolytes Lab Results  Component Value Date   NA 140 01/02/2023   K 3.4 (L) 01/02/2023   CL 103 01/02/2023   CALCIUM 9.2 01/02/2023   MG 2.2 01/16/2018    Bone Lab Results  Component Value Date   25OHVITD1 51 01/16/2018   25OHVITD2 <1.0 01/16/2018   25OHVITD3 50 01/16/2018    Inflammation (CRP: Acute Phase) (ESR: Chronic Phase) Lab Results  Component Value Date   CRP 0.8 07/05/2021   ESRSEDRATE 17 07/05/2021         Note: Above Lab results reviewed.  Recent Imaging Review  DG Shoulder Right CLINICAL DATA:  Right shoulder pain for 2 days, no stated injury  EXAM: RIGHT SHOULDER - 2+ VIEW  COMPARISON:  None Available.  FINDINGS: There is no evidence of fracture or dislocation. There is no evidence of arthropathy or other focal bone abnormality. Soft tissues are unremarkable.  IMPRESSION: No fracture or dislocation of the right shoulder. Joint spaces are preserved.  Electronically Signed   By: Jearld Lesch M.D.   On: 01/16/2023 10:55 DG Hand Complete Right CLINICAL DATA:  Fall onto right hand and thumb 1 week ago, pain  EXAM: RIGHT HAND - COMPLETE 3+ VIEW  COMPARISON:  None Available.  FINDINGS: There is no evidence of fracture or dislocation. There is no evidence of arthropathy or other focal bone abnormality. Soft tissue edema of the thumb. No radiopaque foreign body.  IMPRESSION: 1. No fracture or dislocation of the right hand. Joint spaces are preserved.  2.  Soft tissue edema of the thumb.  No radiopaque foreign body.  Electronically Signed   By: Jearld Lesch M.D.   On: 01/16/2023 10:53 Note: Reviewed        Physical Exam  General  appearance: Well nourished, well developed, and well hydrated. In no apparent acute distress Mental status: Alert, oriented x 3 (person, place, & time)       Respiratory: No evidence of acute respiratory distress Eyes: PERLA Vitals: BP 128/84   Pulse 92   Temp 98 F (36.7 C) (Temporal)   Ht 5\' 2"  (1.575 m)   Wt 170 lb (77.1 kg)   SpO2 97%   BMI 31.09 kg/m  BMI: Estimated body mass index is 31.09 kg/m as calculated from the following:  Height as of this encounter: 5\' 2"  (1.575 m).   Weight as of this encounter: 170 lb (77.1 kg). Ideal: Ideal body weight: 50.1 kg (110 lb 7.2 oz) Adjusted ideal body weight: 60.9 kg (134 lb 4.3 oz)  Assessment   Diagnosis Status  1. Cervicogenic headache (Bilateral)   2. Cervicalgia (Bilateral) (R>L)   3. Chronic upper extremity pain (4th area of Pain) (Bilateral) (R>L)   4. Cervical radiculopathy at C6 (Right)   5. Postop check    Controlled Controlled Controlled   Updated Problems: No problems updated.  Plan of Care  Problem-specific:  No problem-specific Assessment & Plan notes found for this encounter.  Elizabeth Bowers has a current medication list which includes the following long-term medication(s): amlodipine, bupropion, duloxetine, lansoprazole, linzess, and triamterene-hydrochlorothiazide.  Pharmacotherapy (Medications Ordered): No orders of the defined types were placed in this encounter.  Orders:  Orders Placed This Encounter  Procedures   Nursing Instructions:    Please complete this patient's postprocedure evaluation.    Scheduling Instructions:     Please complete this patient's postprocedure evaluation.   Follow-up plan:   Return if symptoms worsen or fail to improve.      Interventional Therapies  Risk  Complexity Considerations:   WNL   Planned  Pending:      Under consideration:   Palliative/therapeutic right cervical ESI #4    Completed:   Diagnostic/therapeutic right L3-4 LESI x1 (09/08/2021)  (100/100/100/100)  Palliative bilateral cervical facet MBB x2 (06/13/2018) (100/100/0/<25)  Diagnostic bilateral lumbar facet MBB x2 (08/08/2018) [facility])  Diagnostic right Cervical ESI x3 (01/11/2023) (100/50/100/100)  Therapeutic right cervical facet RFA x1 (07/23/2018) (100/50/0)    Completed by other providers:   Therapeutic sphenopalatine ganglion Blk by Dr. Clelia Croft x2 (03/04/2018) Diagnostic bilateral upper extremity EMG/PNCV by Dr. Clelia Croft: Right Grade II & Left Grade I CTS (08/23/2018)    Therapeutic  Palliative (PRN) options:   Therapeutic/palliative cervical facet MBB   Therapeutic/palliativelumbar facet MBB   Therapeutic/palliative Cervical ESI   Therapeutic/palliative cervical facet RFA        Recent Visits Date Type Provider Dept  01/11/23 Procedure visit Delano Metz, MD [facility]  01/10/23 Office Visit Delano Metz, MD [facility]  Showing recent visits within past 90 days and meeting all other requirements Today's Visits Date Type Provider Dept  02/01/23 Office Visit Delano Metz, MD [facility]  Showing today's visits and meeting all other requirements Future Appointments No visits were found meeting these conditions. Showing future appointments within next 90 days and meeting all other requirements  I discussed the assessment and treatment plan with the patient. The patient was provided an opportunity to ask questions and all were answered. The patient agreed with the plan and demonstrated an understanding of the instructions.  Patient advised to call back or seek an in-person evaluation if the symptoms or condition worsens.  Duration of encounter: 30 minutes.  Total time on encounter, as per AMA guidelines included both the face-to-face and non-face-to-face time personally spent by the physician and/or other qualified health care professional(s) on the day of the encounter (includes time in activities that require the  physician or other qualified health care professional and does not include time in activities normally performed by clinical staff). Physician's time may include the following activities when performed: Preparing to see the patient (e.g., pre-charting review of records, searching for previously ordered imaging, lab work, and nerve conduction tests) Review of prior analgesic pharmacotherapies. Reviewing PMP Interpreting  ordered tests (e.g., lab work, imaging, nerve conduction tests) Performing post-procedure evaluations, including interpretation of diagnostic procedures Obtaining and/or reviewing separately obtained history Performing a medically appropriate examination and/or evaluation Counseling and educating the patient/family/caregiver Ordering medications, tests, or procedures Referring and communicating with other health care professionals (when not separately reported) Documenting clinical information in the electronic or other health record Independently interpreting results (not separately reported) and communicating results to the patient/ family/caregiver Care coordination (not separately reported)  Note by: Oswaldo Done, MD Date: 02/01/2023; Time: 1:05 PM

## 2023-02-15 ENCOUNTER — Other Ambulatory Visit (HOSPITAL_COMMUNITY): Payer: Self-pay

## 2023-03-03 ENCOUNTER — Other Ambulatory Visit (HOSPITAL_COMMUNITY): Payer: Self-pay

## 2023-03-14 ENCOUNTER — Other Ambulatory Visit (HOSPITAL_COMMUNITY): Payer: Self-pay

## 2023-03-14 ENCOUNTER — Telehealth: Payer: Self-pay

## 2023-03-14 DIAGNOSIS — M654 Radial styloid tenosynovitis [de Quervain]: Secondary | ICD-10-CM | POA: Diagnosis not present

## 2023-03-14 DIAGNOSIS — M7551 Bursitis of right shoulder: Secondary | ICD-10-CM | POA: Diagnosis not present

## 2023-03-14 NOTE — Telephone Encounter (Signed)
Copied from CRM [id number]. Topic: Appointment Scheduling - Scheduling Inquiry for Clinic >> Mar 14, 2023 10:01 AM De Blanch wrote: Reason for CRM:Pt called and stated she received a message that she needs to reschedule her CPE. Pt asked when I was going to be able to get her in before I was able to provide available dates; she stated she was frustrated that she can't see Dr. B; she never has any openings. She stated she would just find another doctor and disconnected the call.  FYI to office. Please advise.

## 2023-03-21 ENCOUNTER — Other Ambulatory Visit: Payer: Self-pay

## 2023-03-21 DIAGNOSIS — R110 Nausea: Secondary | ICD-10-CM | POA: Diagnosis not present

## 2023-03-21 DIAGNOSIS — H53149 Visual discomfort, unspecified: Secondary | ICD-10-CM | POA: Diagnosis not present

## 2023-03-21 DIAGNOSIS — G43811 Other migraine, intractable, with status migrainosus: Secondary | ICD-10-CM | POA: Diagnosis not present

## 2023-03-21 DIAGNOSIS — F40298 Other specified phobia: Secondary | ICD-10-CM | POA: Diagnosis not present

## 2023-03-21 MED ORDER — QULIPTA 60 MG PO TABS
60.0000 mg | ORAL_TABLET | Freq: Every day | ORAL | 5 refills | Status: DC
Start: 2023-03-21 — End: 2023-10-01
  Filled 2023-03-21 – 2023-04-07 (×2): qty 30, 30d supply, fill #0
  Filled 2023-05-02: qty 30, 30d supply, fill #1
  Filled 2023-05-31: qty 30, 30d supply, fill #2
  Filled 2023-06-29: qty 30, 30d supply, fill #3
  Filled 2023-07-30: qty 30, 30d supply, fill #4
  Filled 2023-08-25: qty 30, 30d supply, fill #5

## 2023-03-21 MED ORDER — PROPRANOLOL HCL 20 MG PO TABS
20.0000 mg | ORAL_TABLET | Freq: Two times a day (BID) | ORAL | 11 refills | Status: DC
  Filled 2023-03-21: qty 60, 30d supply, fill #0
  Filled 2023-04-30: qty 60, 30d supply, fill #1

## 2023-03-21 MED ORDER — RIZATRIPTAN BENZOATE 5 MG PO TBDP
ORAL_TABLET | ORAL | 1 refills | Status: DC
  Filled 2023-03-21: qty 6, 30d supply, fill #0
  Filled 2023-04-30: qty 6, 15d supply, fill #1

## 2023-03-22 ENCOUNTER — Other Ambulatory Visit: Payer: Self-pay | Admitting: Student

## 2023-03-22 DIAGNOSIS — F40298 Other specified phobia: Secondary | ICD-10-CM

## 2023-03-22 DIAGNOSIS — H53149 Visual discomfort, unspecified: Secondary | ICD-10-CM

## 2023-03-22 DIAGNOSIS — R110 Nausea: Secondary | ICD-10-CM

## 2023-03-22 DIAGNOSIS — R519 Headache, unspecified: Secondary | ICD-10-CM

## 2023-03-29 DIAGNOSIS — M25531 Pain in right wrist: Secondary | ICD-10-CM | POA: Diagnosis not present

## 2023-04-04 ENCOUNTER — Ambulatory Visit: Payer: Self-pay

## 2023-04-04 ENCOUNTER — Ambulatory Visit
Admission: RE | Admit: 2023-04-04 | Discharge: 2023-04-04 | Disposition: A | Discharge status: Home / Self Care | Payer: 59 | Source: Ambulatory Visit | Attending: Physician Assistant | Admitting: Physician Assistant

## 2023-04-04 VITALS — BP 137/81 | HR 60 | Temp 99.5°F | Resp 16 | Ht 62.0 in | Wt 175.0 lb

## 2023-04-04 DIAGNOSIS — J019 Acute sinusitis, unspecified: Secondary | ICD-10-CM | POA: Diagnosis not present

## 2023-04-04 DIAGNOSIS — R051 Acute cough: Secondary | ICD-10-CM

## 2023-04-04 LAB — SARS CORONAVIRUS 2 BY RT PCR: SARS Coronavirus 2 by RT PCR: NEGATIVE

## 2023-04-04 MED ORDER — DOXYCYCLINE HYCLATE 100 MG PO CAPS: 100.0000 mg | ORAL_CAPSULE | Freq: Two times a day (BID) | ORAL | 0 refills | Status: AC

## 2023-04-04 NOTE — ED Provider Notes (Signed)
[facility]    CSN: [id number] Arrival date & time: 04/04/23  1515      History   Chief Complaint Chief Complaint  Patient presents with   Cough    Appt   Sinus Problem    HPI Elizabeth Bowers is a 57 y.o. female with a history of chronic sinusitis.  She presents today for approximately 1 week worsening nasal congestion with greenish drainage, productive cough, left-sided maxillary sinus pain.  Denies fever, chest pain, shortness of breath but has had some wheezing.  No report of any exposure to COVID.  Has been taking Coricidin HBP for cough.  Concerned about a sinus infection.  Reports history of sinusitis.  States she has had to have sinus surgeries in the past and has been told that she has had MRSA in her sinuses before.  Normally receives doxycycline for sinus infections.  HPI  Past Medical History:  Diagnosis Date   Fatty liver    Fibromyalgia    GERD (gastroesophageal reflux disease)    Hemorrhoids    Hot flashes    Hypertension    IBS (irritable bowel syndrome)    LUQ abdominal pain 10/25/2016   Migraines    Miscarriage    x 2; 4 live births    Multilevel degenerative disc disease    Neuroma    Vocal cord dysfunction     Patient Active Problem List   Diagnosis Date Noted   History of colonic polyps 01/01/2023   OAB (overactive bladder) 10/26/2022   Cervical radiculopathy at C6 (Right) 04/04/2022   Chronic hip pain (Right) 08/16/2021   Impaired range of motion of hip (Right) 08/16/2021   Chronic lower extremity pain (Right) 08/16/2021   Lower extremity radicular pain (Right) 08/16/2021   Lumbar radiculitis (L3/L4) (Right) 08/16/2021   Morton's neuroma of foot (Left) 11/15/2020   MDD (major depressive disorder) 10/01/2020   Anterior tibialis tendinitis, right 04/28/2020   Insomnia 07/07/2019   Benign essential hypertension 04/22/2019   Radial styloid tenosynovitis of left hand 01/13/2019   Pain in right hand 01/13/2019   Bilateral carpal  tunnel syndrome 01/13/2019   Chronic pain of left wrist 01/13/2019   Chronic low back pain (Bilateral) w/o sciatica 08/08/2018   Abnormal MRI, cervical spine (07/10/2018) 07/17/2018   Cervical spondylitis with radiculitis (HCC) (C6) (Bilateral) (R>L) 06/26/2018   Chronic sacroiliac joint pain (Bilateral) (R>L) 06/26/2018   Cervical Grade 1 (2 mm) Retrolisthesis C5 over C6 03/20/2018   Cervical foraminal stenosis (C3-4 & C5-6) (Bilateral) 03/20/2018   DDD (degenerative disc disease), cervical 03/20/2018   Stage 3 chronic kidney disease (HCC) 03/20/2018   Acid reflux disease 03/20/2018   Spondylosis without myelopathy or radiculopathy, cervical region 03/20/2018   Chronic hip pain (Bilateral) (L>R) 03/20/2018   Lumbar facet syndrome (Bilateral) (R>L) 03/20/2018   Spondylosis without myelopathy or radiculopathy, lumbar region 03/20/2018   Chronic Sacroiliac joint dysfunction (Bilateral) (R>L) 03/20/2018   Chronic Somatic dysfunction of sacroiliac joint (Bilateral) (R>L) 03/20/2018   Osteoarthritis of hip (Bilateral) 03/20/2018   Cervicogenic headache (Bilateral) 03/20/2018   Cervicalgia (Bilateral) (R>L) 03/20/2018   Cervical facet syndrome (Bilateral) (R>L) 03/20/2018   Chronic occipital neuralgia (Bilateral) 03/20/2018   Chronic upper extremity pain (4th area of Pain) (Bilateral) (R>L) 03/19/2018   Ganglion cyst 02/20/2018   Chronic low back pain (2ry area of Pain) (Bilateral) (L>R) w/ sciatica (Bilateral) 01/16/2018   Chronic lower extremity pain (3ry area of Pain) (Bilateral) (L>R) 01/16/2018   Chronic neck pain (1ry area  of Pain) (Bilateral) (midline) 01/16/2018   Chronic foot pain (Left) 01/16/2018   Chronic pain syndrome 01/16/2018   Disorder of skeletal system 01/16/2018   Class 1 obesity with serious comorbidity and body mass index (BMI) of 32.0 to 32.9 in adult 11/21/2017   Anxiety 09/28/2017   Chronic knee pain (Right) 09/28/2017   Fatty liver 07/09/2017   Colon polyps  07/09/2017   Fibromyalgia 07/09/2017   Sinusitis, chronic 07/09/2017   Allergic rhinitis 07/09/2017   IBS (irritable bowel syndrome) 07/09/2017   Eyelid dermatitis, allergic/contact 07/09/2017   Chronic sacroiliac joint pain (Right) 12/16/2016   DDD (degenerative disc disease), lumbar 07/18/2016   Family hx of colon cancer 07/18/2016    Past Surgical History:  Procedure Laterality Date   ABDOMINAL HYSTERECTOMY     2012 removed cervix h/o abnormal pap    CHOLECYSTECTOMY     2000   COLONOSCOPY     2018 with + polpys and GIB 2/2 polyp removal    COLONOSCOPY WITH PROPOFOL N/A 12/22/2019   Procedure: COLONOSCOPY WITH PROPOFOL;  Surgeon: Toney Reil, MD;  Location: [facility] ENDOSCOPY;  Service: Gastroenterology;  Laterality: N/A;   COLONOSCOPY WITH PROPOFOL N/A 12/23/2019   Procedure: COLONOSCOPY WITH PROPOFOL;  Surgeon: Toney Reil, MD;  Location: [facility] ENDOSCOPY;  Service: Gastroenterology;  Laterality: N/A;  Last name pronounced LEE-MA   COLONOSCOPY WITH PROPOFOL N/A 01/01/2023   Procedure: COLONOSCOPY WITH PROPOFOL;  Surgeon: Toney Reil, MD;  Location: [facility] ENDOSCOPY;  Service: Gastroenterology;  Laterality: N/A;   ELBOW DEBRIDEMENT     ESOPHAGOGASTRODUODENOSCOPY (EGD) WITH PROPOFOL N/A 02/06/2018   Procedure: ESOPHAGOGASTRODUODENOSCOPY (EGD) WITH PROPOFOL with biopsies;  Surgeon: Toney Reil, MD;  Location: [facility] SUR[facility]ERY [facility];  Service: Endoscopy;  Laterality: N/A;   ETHMOIDECTOMY N/A 09/28/2021   Procedure: TOTAL ETHMOIDECTOMY;  Surgeon: Bud Face, MD;  Location: [facility]ERY [facility];  Service: ENT;  Laterality: N/A;   FOOT SURGERY     IMAGE GUIDED SINUS SURGERY N/A 09/28/2021   Procedure: IMAGE GUIDED SINUS SURGERY;  Surgeon: Bud Face, MD;  Location: [facility]ERY [facility];  Service: ENT;  Laterality: N/A;  PLACED DISK ON OR CHARGE NURSE DESK 4-21  KP   MAXILLARY ANTROSTOMY Bilateral 09/28/2021   Procedure: MAXILLARY ANTROSTOMY WITHOUT TISSUE  REMOVAL;  Surgeon: Bud Face, MD;  Location: [facility] SURGERY CNTR;  Service: ENT;  Laterality: Bilateral;   POLYPECTOMY  01/01/2023   Procedure: POLYPECTOMY;  Surgeon: Toney Reil, MD;  Location: [facility] ENDOSCOPY;  Service: Gastroenterology;;   Selina Cooley Bilateral 09/28/2021   Procedure: SPHENOIDOTOMY;  Surgeon: Bud Face, MD;  Location: [facility] SURGERY CNTR;  Service: ENT;  Laterality: Bilateral;    OB History   No obstetric history on file.      Home Medications    Prior to Admission medications   Medication Sig Start Date End Date Taking? Authorizing Provider  amLODipine (NORVASC) 10 MG tablet Take 1 tablet (10 mg) by mouth daily. 10/02/22  Yes Bacigalupo, Marzella Schlein, MD  Atogepant (QULIPTA) 60 MG TABS Take 1 tablet (60 mg total) by mouth daily. 03/21/23  Yes   BUDESONIDE IN ADD (SQUEEZE 0.5ML TWICE=1ML DOSE) OF MEDICATION TO OF SALINE IN SALINE IRRIGATION BOTTLE; IRRIGATE SINUSES WITH THROUGH EACH NOSTRIL TWICE DAILY 08/29/22  Yes [provider]  buPROPion (WELLBUTRIN XL) 300 MG 24 hr tablet Take 1 tablet (300 mg total) by mouth daily. 01/12/23  Yes Bacigalupo, Marzella Schlein, MD  doxycycline (VIBRAMYCIN) 100 MG capsule Take 1 capsule (100 mg total)  by mouth 2 (two) times daily for 7 days. 04/04/23 04/11/23 Yes Shirlee Latch, PA-C  DULoxetine (CYMBALTA) 60 MG capsule Take 1 capsule (60 mg total) by mouth daily. 11/08/22  Yes Bacigalupo, Marzella Schlein, MD  lansoprazole (PREVACID) 30 MG capsule Take 1 capsule (30 mg total) by mouth 2 (two) times daily before a meal. 09/05/22  Yes Bacigalupo, Marzella Schlein, MD  LINZESS 290 MCG CAPS capsule Take 1 capsule (290 mcg total) by mouth daily. 12/28/22  Yes Bacigalupo, Marzella Schlein, MD  methylPREDNISolone (MEDROL DOSEPAK) 4 MG TBPK tablet Follow package directions. 12/28/22  Yes   Multiple Vitamin (MULTIVITAMIN WITH MINERALS) TABS tablet Take 1 tablet by mouth daily.   Yes [provider]  ondansetron (ZOFRAN-ODT) 8 MG  disintegrating tablet Take 1 tablet (8 mg total) by mouth every 8 (eight) hours as needed for nausea or vomiting. 11/27/22  Yes Brimage, Vondra, DO  oxybutynin (DITROPAN-XL) 5 MG 24 hr tablet Take 1 tablet (5 mg total) by mouth at bedtime. 10/26/22  Yes Bacigalupo, Marzella Schlein, MD  propranolol (INDERAL) 20 MG tablet Take 1 tablet (20 mg total) by mouth 2 (two) times daily. 03/21/23  Yes   rizatriptan (MAXALT-MLT) 5 MG disintegrating tablet Take 1 tablet (5 mg total) by mouth as directed for Migraine May take a second dose after 2 hours if needed. 03/21/23  Yes   triamterene-hydrochlorothiazide (MAXZIDE-25) 37.5-25 MG tablet Take 1 tablet by mouth daily. 11/17/22  Yes Bacigalupo, Marzella Schlein, MD  Ubrogepant (UBRELVY) 50 MG TABS Take 1 tablet (50 mg total) by mouth once as needed for up to 1 dose. Take 50 mg as a single dose; if symptoms persist or return, may repeat dose after 2 hours. Maximum: 200 mg per 24 hours. 12/28/22  Yes     Family History Family History  Problem Relation Age of Onset   Hypertension Mother    Hyperlipidemia Mother    Glaucoma Mother    Diabetes Mother        type 2    COPD Father        emphysema   Alcohol abuse Father    Colon cancer Sister    Cancer Sister        rectal/colon cancer no h/o IBD   Colon polyps Sister    Asthma Sister    COPD Sister    Kidney disease Sister    Mental illness Sister        bipolar    Miscarriages / [place] Sister    Uterine cancer Sister    Alcohol abuse Son    Depression Son    Diabetes Son    Drug abuse Son    Mental illness Son        bipolar    Diabetes Maternal Grandmother    COPD Paternal Grandmother    Cancer Paternal Grandfather        FH colon cancer maternal aunts/uncles    Miscarriages / Stillbirths Sister    Arthritis Son    Arthritis Son    Breast cancer Neg Hx     Social History Social History   Tobacco Use   Smoking status: Former    Current packs/day: 0.00    Average packs/day: 0.8 packs/day for 5.0  years (3.8 ttl pk-yrs)    Types: Cigarettes    Start date: 07/05/1982    Quit date: 07/06/1987    Years since quitting: 35.7   Smokeless tobacco: Never  Vaping Use   Vaping status: Never Used  Substance Use Topics   Alcohol use: Yes    Alcohol/week: 2.0 - 4.0 standard drinks of alcohol    Types: 2 - 4 Cans of beer per week    Comment: wine cooler   Drug use: Never     Allergies   Gluten meal, Lactose, Ranitidine, Amphetamine-dextroamphetamine, Lactose intolerance (gi), Reglan [metoclopramide], Topiramate er, Wheat, Soap, and Sulfa antibiotics   Review of Systems Review of Systems  Constitutional:  Positive for fatigue. Negative for chills, diaphoresis and fever.  HENT:  Positive for congestion, postnasal drip, rhinorrhea and sinus pressure. Negative for ear pain, sinus pain and sore throat.   Respiratory:  Positive for cough. Negative for shortness of breath.   Gastrointestinal:  Negative for abdominal pain, nausea and vomiting.  Musculoskeletal:  Negative for arthralgias and myalgias.  Skin:  Negative for rash.  Neurological:  Negative for weakness and headaches.  Hematological:  Negative for adenopathy.     Physical Exam Triage Vital Signs ED Triage Vitals  Encounter Vitals Group     BP 04/04/23 1618 137/81     Systolic BP Percentile --      Diastolic BP Percentile --      Pulse Rate 04/04/23 1618 60     Resp 04/04/23 1618 16     Temp 04/04/23 1618 99.5 F (37.5 C)     Temp Source 04/04/23 1618 Oral     SpO2 04/04/23 1618 95 %     Weight 04/04/23 1618 175 lb (79.4 kg)     Height 04/04/23 1618 5\' 2"  (1.575 m)     Head Circumference --      Peak Flow --      Pain Score 04/04/23 1621 5     Pain Loc --      Pain Education --      Exclude from Growth Chart --    No data found.  Updated Vital Signs BP 137/81 (BP Location: Right Arm)   Pulse 60   Temp 99.5 F (37.5 C) (Oral)   Resp 16   Ht 5\' 2"  (1.575 m)   Wt 175 lb (79.4 kg)   SpO2 95%   BMI 32.01 kg/m       Physical Exam Vitals and nursing note reviewed.  Constitutional:      General: She is not in acute distress.    Appearance: Normal appearance. She is not ill-appearing or toxic-appearing.  HENT:     Head: Normocephalic and atraumatic.     Right Ear: Tympanic membrane, ear canal and external ear normal.     Left Ear: Tympanic membrane, ear canal and external ear normal.     Nose: Congestion present.     Left Sinus: Maxillary sinus tenderness present.     Mouth/Throat:     Mouth: Mucous membranes are moist.     Pharynx: Oropharynx is clear.  Eyes:     General: No scleral icterus.       Right eye: No discharge.        Left eye: No discharge.     Conjunctiva/sclera: Conjunctivae normal.  Cardiovascular:     Rate and Rhythm: Normal rate and regular rhythm.     Heart sounds: Normal heart sounds.  Pulmonary:     Effort: Pulmonary effort is normal. No respiratory distress.     Breath sounds: Normal breath sounds.  Musculoskeletal:     Cervical back: Neck supple.  Skin:    General: Skin is dry.  Neurological:     General: No  focal deficit present.     Mental Status: She is alert. Mental status is at baseline.     Motor: No weakness.     Gait: Gait normal.  Psychiatric:        Mood and Affect: Mood normal.        Behavior: Behavior normal.        Thought Content: Thought content normal.      UC Treatments / Results  Labs (all labs ordered are listed, but only abnormal results are displayed) Labs Reviewed  SARS CORONAVIRUS 2 BY RT PCR    EKG   Radiology No results found.  Procedures Procedures (including critical care time)  Medications Ordered in UC Medications - No data to display  Initial Impression / Assessment and Plan / UC Course  I have reviewed the triage vital signs and the nursing notes.  Pertinent labs & imaging results that were available during my care of the patient were reviewed by me and considered in my medical decision making (see chart  for details).   57 year old female presents for 1 week worsening cough, nasal congestion, left-sided maxillary sinus pressure.  Has chronic sinusitis and says she has nasal congestion, postnasal drip and runny nose frequently.  Has been taking Coricidin.  No fever.  Vitals normal and stable and she is overall well-appearing.  On exam is nasal congestion, worse on the left.  Tenderness of left maxillary sinus.  Chest clear.  PCR COVID test obtained.  Negative.  Acute bacterial sinusitis based on her presentation today.  Will treat with doxycycline as she has had success with that medicine in the past.  Supportive care also advised with continuing Coricidin HBP and nasal sprays as needed.  Increase rest and fluids.  Reviewed return and ER precautions.  She declines a work note.   Final Clinical Impressions(s) / UC Diagnoses   Final diagnoses:  Acute sinusitis, recurrence not specified, unspecified location  Acute cough   Discharge Instructions   None    ED Prescriptions     Medication Sig Dispense Auth. Provider   doxycycline (VIBRAMYCIN) 100 MG capsule Take 1 capsule (100 mg total) by mouth 2 (two) times daily for 7 days. 14 capsule Shirlee Latch, PA-C      I have reviewed the PDMP during this encounter.   Eusebio Friendly B, PA-C 04/04/23 1700

## 2023-04-04 NOTE — Telephone Encounter (Signed)
Patient called and she says she has already been to the UC and will be sent an antibiotic.   Summary: at 5 pain left side of face , congestion,cough   Pt called in with sinus issues, says has pain at a 5 on left side of face, congestion and cough

## 2023-04-04 NOTE — ED Triage Notes (Signed)
Pt c/o cough & sinus pressure x5 days. Has tried safe tussin w/o relief.

## 2023-04-05 NOTE — Telephone Encounter (Signed)
Noted  

## 2023-04-07 ENCOUNTER — Other Ambulatory Visit: Payer: Self-pay | Admitting: Family Medicine

## 2023-04-07 DIAGNOSIS — K581 Irritable bowel syndrome with constipation: Secondary | ICD-10-CM

## 2023-04-09 ENCOUNTER — Other Ambulatory Visit: Payer: Self-pay

## 2023-04-09 ENCOUNTER — Other Ambulatory Visit (HOSPITAL_COMMUNITY): Payer: Self-pay

## 2023-04-09 ENCOUNTER — Ambulatory Visit
Admission: RE | Admit: 2023-04-09 | Discharge: 2023-04-09 | Disposition: A | Discharge status: Home / Self Care | Payer: 59 | Source: Ambulatory Visit | Attending: Student | Admitting: Student

## 2023-04-09 DIAGNOSIS — H53149 Visual discomfort, unspecified: Secondary | ICD-10-CM

## 2023-04-09 DIAGNOSIS — R110 Nausea: Secondary | ICD-10-CM

## 2023-04-09 DIAGNOSIS — R519 Headache, unspecified: Secondary | ICD-10-CM

## 2023-04-09 DIAGNOSIS — F40298 Other specified phobia: Secondary | ICD-10-CM

## 2023-04-09 MED ORDER — AMLODIPINE BESYLATE 10 MG PO TABS
10.0000 mg | ORAL_TABLET | Freq: Every day | ORAL | 1 refills | Status: DC
  Filled 2023-04-09: qty 90, 90d supply, fill #0
  Filled 2023-05-31 – 2023-06-25 (×2): qty 90, 90d supply, fill #1

## 2023-04-09 MED ORDER — LINZESS 290 MCG PO CAPS
290.0000 ug | ORAL_CAPSULE | Freq: Every day | ORAL | 1 refills | Status: DC
  Filled 2023-04-09: qty 90, 90d supply, fill #0
  Filled 2023-05-31 – 2023-06-29 (×2): qty 90, 90d supply, fill #1

## 2023-04-09 NOTE — Telephone Encounter (Signed)
I wasn't sure who to send the request to. Patient canceled the appointment 11/256 with Dr. B but has one on the same day with Dr. Payton Mccallum.

## 2023-04-30 ENCOUNTER — Other Ambulatory Visit: Payer: Self-pay | Admitting: Family Medicine

## 2023-04-30 ENCOUNTER — Other Ambulatory Visit: Payer: Self-pay

## 2023-04-30 ENCOUNTER — Encounter: Payer: 59 | Admitting: Family Medicine

## 2023-04-30 ENCOUNTER — Other Ambulatory Visit (HOSPITAL_COMMUNITY): Payer: Self-pay

## 2023-05-01 ENCOUNTER — Encounter: Payer: Self-pay | Admitting: *Deleted

## 2023-05-01 ENCOUNTER — Other Ambulatory Visit (HOSPITAL_COMMUNITY): Payer: Self-pay

## 2023-05-01 MED ORDER — BUPROPION HCL ER (XL) 300 MG PO TB24
300.0000 mg | ORAL_TABLET | Freq: Every day | ORAL | 1 refills | Status: DC
  Filled 2023-05-01: qty 90, 90d supply, fill #0
  Filled 2023-05-02 – 2023-07-30 (×3): qty 90, 90d supply, fill #1

## 2023-05-01 NOTE — Telephone Encounter (Signed)
Requested medication (s) are due for refill today:   Yes  Requested medication (s) are on the active medication list:   Yes  Future visit scheduled:   No   MyChart message sent requesting her to call in and make her 6 month check up appt.   Last ordered: 01/12/2023 #90, 0 refills  Unable to refill because 6 mo. Visit needed.   All labs are up to date.     Requested Prescriptions  Pending Prescriptions Disp Refills   buPROPion (WELLBUTRIN XL) 300 MG 24 hr tablet 90 tablet 0    Sig: Take 1 tablet (300 mg total) by mouth daily.     Psychiatry: Antidepressants - bupropion Failed - 04/30/2023  2:50 PM      Failed - ALT in normal range and within 360 days    ALT  Date Value Ref Range Status  11/02/2022 56 (H) 0 - 32 IU/L Final         Failed - Valid encounter within last 6 months    Recent Outpatient Visits           6 months ago Encounter for annual physical exam   [place] [facility] [place], Marzella Schlein, MD   10 months ago Urethra disorder   [place] [facility] [place], Marzella Schlein, MD   1 year ago Benign essential hypertension   [place] [facility] [place], Marzella Schlein, MD   1 year ago Encounter for annual physical exam   [place] [facility] [place], Marzella Schlein, MD   1 year ago Benign essential hypertension   [place] [facility] [place], Marzella Schlein, MD              Passed - Cr in normal range and within 360 days    Creatinine, Ser  Date Value Ref Range Status  01/02/2023 0.94 0.44 - 1.00 mg/dL Final         Passed - AST in normal range and within 360 days    AST  Date Value Ref Range Status  11/02/2022 39 0 - 40 IU/L Final         Passed - Completed PHQ-2 or PHQ-9 in the last 360 days      Passed - Last BP in normal range    BP Readings from Last 1 Encounters:  04/04/23 137/81

## 2023-05-02 ENCOUNTER — Other Ambulatory Visit: Payer: Self-pay | Admitting: Family Medicine

## 2023-05-04 ENCOUNTER — Other Ambulatory Visit (HOSPITAL_COMMUNITY): Payer: Self-pay

## 2023-05-04 ENCOUNTER — Other Ambulatory Visit: Payer: Self-pay

## 2023-05-05 ENCOUNTER — Other Ambulatory Visit: Payer: Self-pay | Admitting: Family Medicine

## 2023-05-07 ENCOUNTER — Other Ambulatory Visit (HOSPITAL_COMMUNITY): Payer: Self-pay

## 2023-05-07 ENCOUNTER — Other Ambulatory Visit: Payer: Self-pay

## 2023-05-07 MED ORDER — DULOXETINE HCL 60 MG PO CPEP
60.0000 mg | ORAL_CAPSULE | Freq: Every day | ORAL | 0 refills | Status: DC
  Filled 2023-05-07: qty 90, 90d supply, fill #0

## 2023-05-07 MED ORDER — OXYBUTYNIN CHLORIDE ER 5 MG PO TB24
5.0000 mg | ORAL_TABLET | Freq: Every day | ORAL | 5 refills | Status: DC
  Filled 2023-05-07: qty 30, 30d supply, fill #0
  Filled 2023-05-31: qty 30, 30d supply, fill #1

## 2023-05-07 NOTE — Telephone Encounter (Signed)
Pt. Has appointment. Requested Prescriptions  Pending Prescriptions Disp Refills   DULoxetine (CYMBALTA) 60 MG capsule 90 capsule 0    Sig: Take 1 capsule (60 mg total) by mouth daily.     Psychiatry: Antidepressants - SNRI - duloxetine Failed - 05/02/2023  9:42 PM      Failed - Valid encounter within last 6 months    Recent Outpatient Visits           6 months ago Encounter for annual physical exam   [facility] [facility] [place], Marzella Schlein, MD   10 months ago Urethra disorder   [place] [facility] [place], Marzella Schlein, MD   1 year ago Benign essential hypertension   [place] [facility] [place], Marzella Schlein, MD   1 year ago Encounter for annual physical exam   [place] [facility] [place], Marzella Schlein, MD   1 year ago Benign essential hypertension   [place] [facility] [place], Marzella Schlein, MD       Future Appointments             Tomorrow Bacigalupo, Marzella Schlein, MD [place] [facility]            Passed - Cr in normal range and within 360 days    Creatinine, Ser  Date Value Ref Range Status  01/02/2023 0.94 0.44 - 1.00 mg/dL Final         Passed - eGFR is 30 or above and within 360 days    GFR calc Af Amer  Date Value Ref Range Status  07/07/2019 >60 >60 mL/min Final   GFR, Estimated  Date Value Ref Range Status  01/02/2023 >60 >60 mL/min Final    Comment:    (NOTE) Calculated using the CKD-EPI Creatinine Equation (2021)    eGFR  Date Value Ref Range Status  11/02/2022 65 >59 mL/min/1.73 Final         Passed - Completed PHQ-2 or PHQ-9 in the last 360 days      Passed - Last BP in normal range    BP Readings from Last 1 Encounters:  04/04/23 137/81

## 2023-05-08 ENCOUNTER — Encounter: Payer: Self-pay | Admitting: Family Medicine

## 2023-05-08 ENCOUNTER — Other Ambulatory Visit (HOSPITAL_COMMUNITY): Payer: Self-pay

## 2023-05-08 ENCOUNTER — Other Ambulatory Visit: Payer: Self-pay

## 2023-05-08 ENCOUNTER — Ambulatory Visit (INDEPENDENT_AMBULATORY_CARE_PROVIDER_SITE_OTHER): Payer: 59 | Admitting: Family Medicine

## 2023-05-08 VITALS — BP 129/88 | HR 86 | Resp 16 | Ht 62.0 in | Wt 183.6 lb

## 2023-05-08 DIAGNOSIS — I10 Essential (primary) hypertension: Secondary | ICD-10-CM | POA: Diagnosis not present

## 2023-05-08 DIAGNOSIS — M797 Fibromyalgia: Secondary | ICD-10-CM | POA: Diagnosis not present

## 2023-05-08 DIAGNOSIS — Z6832 Body mass index (BMI) 32.0-32.9, adult: Secondary | ICD-10-CM | POA: Diagnosis not present

## 2023-05-08 DIAGNOSIS — N1831 Chronic kidney disease, stage 3a: Secondary | ICD-10-CM | POA: Diagnosis not present

## 2023-05-08 DIAGNOSIS — E66811 Obesity, class 1: Secondary | ICD-10-CM

## 2023-05-08 DIAGNOSIS — F419 Anxiety disorder, unspecified: Secondary | ICD-10-CM

## 2023-05-08 DIAGNOSIS — F331 Major depressive disorder, recurrent, moderate: Secondary | ICD-10-CM | POA: Diagnosis not present

## 2023-05-08 MED ORDER — HYDROXYZINE HCL 10 MG PO TABS
10.0000 mg | ORAL_TABLET | Freq: Three times a day (TID) | ORAL | 1 refills | Status: DC | PRN
  Filled 2023-05-08: qty 60, 20d supply, fill #0
  Filled 2023-05-31: qty 60, 20d supply, fill #1

## 2023-05-08 NOTE — Progress Notes (Signed)
Established Patient Office Visit  Subjective   Patient ID: Elizabeth Bowers, female    DOB: Oct 19, 1965  Age: 57 y.o. MRN: [id number]  Chief Complaint  Patient presents with   Medical Management of Chronic Issues    HPI  Discussed the use of AI scribe software for clinical note transcription with the patient, who gave verbal consent to proceed.  History of Present Illness   The patient, with a history of fibromyalgia, hypertension, and migraines, presents with complaints of sleep disturbances and anxiety. They report having "bad days" where they feel extremely tired and sleep throughout the day, and other days where they struggle with insomnia. The patient describes their anxiety as episodic, with symptoms including shaking hands and voice. They express dissatisfaction with their current medications, Wellbutrin and Cymbalta, stating that they still experience anxiety episodes.  The patient also reports worsening migraines, which have been linked to a recent diagnosis of a deteriorating C1. They have been receiving injections at a pain clinic, but express concern that this treatment is only a temporary solution and may be exacerbating the issue.         ROS    Objective:     BP 129/88 (BP Location: Left Arm, Patient Position: Sitting, Cuff Size: Normal)   Pulse 86   Resp 16   Ht 5\' 2"  (1.575 m)   Wt 183 lb 9.6 oz (83.3 kg)   SpO2 98%   BMI 33.58 kg/m    Physical Exam Vitals reviewed.  Constitutional:      General: She is not in acute distress.    Appearance: Normal appearance. She is well-developed. She is not diaphoretic.  HENT:     Head: Normocephalic and atraumatic.  Eyes:     General: No scleral icterus.    Conjunctiva/sclera: Conjunctivae normal.  Neck:     Thyroid: No thyromegaly.  Cardiovascular:     Rate and Rhythm: Normal rate and regular rhythm.     Heart sounds: Normal heart sounds. No murmur heard. Pulmonary:     Effort: Pulmonary effort is normal. No  respiratory distress.     Breath sounds: Normal breath sounds. No wheezing, rhonchi or rales.  Musculoskeletal:     Cervical back: Neck supple.     Right lower leg: No edema.     Left lower leg: No edema.  Lymphadenopathy:     Cervical: No cervical adenopathy.  Skin:    General: Skin is warm and dry.     Findings: No rash.  Neurological:     Mental Status: She is alert and oriented to person, place, and time. Mental status is at baseline.  Psychiatric:        Mood and Affect: Mood normal.        Behavior: Behavior normal.    No results found for any visits on 05/08/23.    The 10-year ASCVD risk score (Arnett DK, et al., 2019) is: 2.5%    Assessment & Plan:   Problem List Items Addressed This Visit       Cardiovascular and Mediastinum   Benign essential hypertension - Primary    Chronic condition managed with amlodipine 10 mg and triamterene/HCTZ 25 mg daily. - Continue amlodipine 10 mg daily - Continue triamterene/HCTZ 25 mg daily - Monitor blood pressure regularly        Genitourinary   Stage 3 chronic kidney disease (HCC)    Chronic and stable Review metabolic panel Avoid nephrotoxic meds  Other   Fibromyalgia (Chronic)    Chronic condition with widespread musculoskeletal pain, fatigue, and tenderness. Severe fatigue and difficulty staying awake, impacting daily activities. Symptoms are episodic and significantly affect quality of life. Discussed hydroxyzine for episodic anxiety and sleep disturbances, noting potential drowsiness. - Prescribe hydroxyzine for episodic anxiety and sleep disturbances, up to three times daily as needed - Advise to try hydroxyzine at home initially to assess drowsiness effect      Anxiety    Episodic anxiety with physical manifestations such as hand and voice tremors. Current medications (Wellbutrin and Cymbalta) are not fully effective. Hydroxyzine suggested for acute episodes, noting potential drowsiness. - Prescribe  hydroxyzine for episodic anxiety, up to three times daily as needed - Advise to try hydroxyzine at home initially to assess drowsiness effect      Relevant Medications   hydrOXYzine (ATARAX) 10 MG tablet   Class 1 obesity with serious comorbidity and body mass index (BMI) of 32.0 to 32.9 in adult    Discussed importance of healthy weight management Discussed diet and exercise       MDD (major depressive disorder)   Relevant Medications   hydrOXYzine (ATARAX) 10 MG tablet        Migraine Recurrent migraines with recent MRI showing worsening of C1 vertebra. Current pain management with injections is ineffective and may exacerbate the condition. Need to follow up with a neurologist and discontinue cervical spine injections. - Follow up with neurologist for further management - Discontinue cervical spine injections at pain clinic  General Health Maintenance Routine follow-up and health maintenance. Recent labs reviewed, no immediate need for additional tests. Next physical scheduled for May. - Schedule next physical in May - Perform routine labs at next physical.        Return in about 6 months (around 11/06/2023) for CPE.    Shirlee Latch, MD

## 2023-05-08 NOTE — Assessment & Plan Note (Signed)
Chronic condition with widespread musculoskeletal pain, fatigue, and tenderness. Severe fatigue and difficulty staying awake, impacting daily activities. Symptoms are episodic and significantly affect quality of life. Discussed hydroxyzine for episodic anxiety and sleep disturbances, noting potential drowsiness. - Prescribe hydroxyzine for episodic anxiety and sleep disturbances, up to three times daily as needed - Advise to try hydroxyzine at home initially to assess drowsiness effect

## 2023-05-08 NOTE — Assessment & Plan Note (Signed)
Chronic condition managed with amlodipine 10 mg and triamterene/HCTZ 25 mg daily. - Continue amlodipine 10 mg daily - Continue triamterene/HCTZ 25 mg daily - Monitor blood pressure regularly

## 2023-05-08 NOTE — Assessment & Plan Note (Signed)
Episodic anxiety with physical manifestations such as hand and voice tremors. Current medications (Wellbutrin and Cymbalta) are not fully effective. Hydroxyzine suggested for acute episodes, noting potential drowsiness. - Prescribe hydroxyzine for episodic anxiety, up to three times daily as needed - Advise to try hydroxyzine at home initially to assess drowsiness effect

## 2023-05-08 NOTE — Assessment & Plan Note (Signed)
Discussed importance of healthy weight management Discussed diet and exercise  

## 2023-05-08 NOTE — Assessment & Plan Note (Signed)
Chronic and stable Review metabolic panel Avoid nephrotoxic meds

## 2023-05-14 ENCOUNTER — Other Ambulatory Visit: Payer: Self-pay | Admitting: Family Medicine

## 2023-05-14 ENCOUNTER — Other Ambulatory Visit (HOSPITAL_COMMUNITY): Payer: Self-pay

## 2023-05-14 ENCOUNTER — Other Ambulatory Visit: Payer: Self-pay

## 2023-05-14 DIAGNOSIS — K219 Gastro-esophageal reflux disease without esophagitis: Secondary | ICD-10-CM

## 2023-05-14 DIAGNOSIS — I10 Essential (primary) hypertension: Secondary | ICD-10-CM | POA: Diagnosis not present

## 2023-05-14 DIAGNOSIS — R110 Nausea: Secondary | ICD-10-CM | POA: Diagnosis not present

## 2023-05-14 DIAGNOSIS — G43719 Chronic migraine without aura, intractable, without status migrainosus: Secondary | ICD-10-CM | POA: Diagnosis not present

## 2023-05-14 DIAGNOSIS — H53149 Visual discomfort, unspecified: Secondary | ICD-10-CM | POA: Diagnosis not present

## 2023-05-14 DIAGNOSIS — F40298 Other specified phobia: Secondary | ICD-10-CM | POA: Diagnosis not present

## 2023-05-14 MED ORDER — RIZATRIPTAN BENZOATE 5 MG PO TBDP
5.0000 mg | ORAL_TABLET | ORAL | 1 refills | Status: DC
  Filled 2023-05-14 – 2023-05-28 (×2): qty 6, 15d supply, fill #0
  Filled 2023-05-31 – 2023-06-12 (×2): qty 6, 15d supply, fill #1

## 2023-05-15 ENCOUNTER — Other Ambulatory Visit: Payer: Self-pay

## 2023-05-15 ENCOUNTER — Other Ambulatory Visit (HOSPITAL_COMMUNITY): Payer: Self-pay

## 2023-05-15 DIAGNOSIS — R2231 Localized swelling, mass and lump, right upper limb: Secondary | ICD-10-CM | POA: Insufficient documentation

## 2023-05-15 DIAGNOSIS — M79641 Pain in right hand: Secondary | ICD-10-CM | POA: Diagnosis not present

## 2023-05-15 DIAGNOSIS — M797 Fibromyalgia: Secondary | ICD-10-CM | POA: Diagnosis not present

## 2023-05-15 DIAGNOSIS — M25419 Effusion, unspecified shoulder: Secondary | ICD-10-CM | POA: Insufficient documentation

## 2023-05-15 DIAGNOSIS — M25511 Pain in right shoulder: Secondary | ICD-10-CM | POA: Insufficient documentation

## 2023-05-15 DIAGNOSIS — M25411 Effusion, right shoulder: Secondary | ICD-10-CM | POA: Insufficient documentation

## 2023-05-15 MED ORDER — LANSOPRAZOLE 30 MG PO CPDR
30.0000 mg | DELAYED_RELEASE_CAPSULE | Freq: Two times a day (BID) | ORAL | 1 refills | Status: DC
Start: 2023-05-15 — End: 2023-11-05
  Filled 2023-05-15: qty 180, 90d supply, fill #0
  Filled 2023-05-31 – 2023-08-10 (×2): qty 180, 90d supply, fill #1

## 2023-05-17 ENCOUNTER — Other Ambulatory Visit: Payer: Self-pay | Admitting: Physician Assistant

## 2023-05-17 DIAGNOSIS — M25511 Pain in right shoulder: Secondary | ICD-10-CM

## 2023-05-18 ENCOUNTER — Ambulatory Visit
Admission: RE | Admit: 2023-05-18 | Discharge: 2023-05-18 | Disposition: A | Discharge status: Home / Self Care | Payer: 59 | Source: Ambulatory Visit | Attending: Physician Assistant | Admitting: Physician Assistant

## 2023-05-18 DIAGNOSIS — M129 Arthropathy, unspecified: Secondary | ICD-10-CM | POA: Diagnosis not present

## 2023-05-18 DIAGNOSIS — M25511 Pain in right shoulder: Secondary | ICD-10-CM | POA: Insufficient documentation

## 2023-05-23 DIAGNOSIS — M79641 Pain in right hand: Secondary | ICD-10-CM | POA: Diagnosis not present

## 2023-05-23 DIAGNOSIS — M25511 Pain in right shoulder: Secondary | ICD-10-CM | POA: Diagnosis not present

## 2023-05-25 ENCOUNTER — Other Ambulatory Visit: Payer: Self-pay

## 2023-05-28 ENCOUNTER — Other Ambulatory Visit: Payer: Self-pay | Admitting: Family Medicine

## 2023-05-28 ENCOUNTER — Encounter: Payer: Self-pay | Admitting: Family Medicine

## 2023-05-28 ENCOUNTER — Other Ambulatory Visit (HOSPITAL_COMMUNITY): Payer: Self-pay

## 2023-05-29 ENCOUNTER — Other Ambulatory Visit: Payer: Self-pay

## 2023-05-29 ENCOUNTER — Other Ambulatory Visit (HOSPITAL_COMMUNITY): Payer: Self-pay

## 2023-05-29 MED ORDER — TRIAMTERENE-HCTZ 37.5-25 MG PO TABS
1.0000 | ORAL_TABLET | Freq: Every day | ORAL | 1 refills | Status: DC
  Filled 2023-05-29: qty 90, 90d supply, fill #0
  Filled 2023-05-31 – 2023-08-10 (×2): qty 90, 90d supply, fill #1

## 2023-05-29 NOTE — Telephone Encounter (Signed)
Requested Prescriptions  Pending Prescriptions Disp Refills   triamterene-hydrochlorothiazide (MAXZIDE-25) 37.5-25 MG tablet 90 tablet 1    Sig: Take 1 tablet by mouth daily.     Cardiovascular: Diuretic Combos Failed - 05/29/2023  3:00 PM      Failed - K in normal range and within 180 days    Potassium  Date Value Ref Range Status  01/02/2023 3.4 (L) 3.5 - 5.1 mmol/L Final         Passed - Na in normal range and within 180 days    Sodium  Date Value Ref Range Status  01/02/2023 140 135 - 145 mmol/L Final  11/02/2022 142 134 - 144 mmol/L Final         Passed - Cr in normal range and within 180 days    Creatinine, Ser  Date Value Ref Range Status  01/02/2023 0.94 0.44 - 1.00 mg/dL Final         Passed - Last BP in normal range    BP Readings from Last 1 Encounters:  05/08/23 129/88         Passed - Valid encounter within last 6 months    Recent Outpatient Visits           3 weeks ago Benign essential hypertension   [facility] [place], Marzella Schlein, MD   7 months ago Encounter for annual physical exam   [facility] [place], Marzella Schlein, MD   11 months ago Urethra disorder   [place] [facility] [place], Marzella Schlein, MD   1 year ago Benign essential hypertension   [facility] [place], Marzella Schlein, MD   1 year ago Encounter for annual physical exam   [facility] [place], Marzella Schlein, MD       Future Appointments             In 5 months Bacigalupo, Marzella Schlein, MD [facility]

## 2023-05-31 ENCOUNTER — Other Ambulatory Visit (HOSPITAL_COMMUNITY): Payer: Self-pay

## 2023-05-31 ENCOUNTER — Other Ambulatory Visit: Payer: Self-pay

## 2023-05-31 ENCOUNTER — Other Ambulatory Visit: Payer: Self-pay | Admitting: Family Medicine

## 2023-06-01 ENCOUNTER — Other Ambulatory Visit: Payer: Self-pay

## 2023-06-01 ENCOUNTER — Other Ambulatory Visit (HOSPITAL_COMMUNITY): Payer: Self-pay

## 2023-06-04 ENCOUNTER — Other Ambulatory Visit: Payer: Self-pay

## 2023-06-04 MED ORDER — OXYBUTYNIN CHLORIDE ER 10 MG PO TB24
10.0000 mg | ORAL_TABLET | Freq: Every day | ORAL | 2 refills | Status: DC
  Filled 2023-06-04: qty 30, 30d supply, fill #0
  Filled 2023-07-18: qty 30, 30d supply, fill #1

## 2023-06-04 NOTE — Telephone Encounter (Signed)
Increase the oxybutinin XR to 10mg  at bedtime. Please send new Rx #[id number] and let patient know.

## 2023-06-07 DIAGNOSIS — M25511 Pain in right shoulder: Secondary | ICD-10-CM | POA: Diagnosis not present

## 2023-06-07 DIAGNOSIS — M25512 Pain in left shoulder: Secondary | ICD-10-CM | POA: Diagnosis not present

## 2023-06-07 DIAGNOSIS — M79641 Pain in right hand: Secondary | ICD-10-CM | POA: Diagnosis not present

## 2023-06-11 ENCOUNTER — Other Ambulatory Visit: Payer: Self-pay

## 2023-06-11 ENCOUNTER — Ambulatory Visit
Admission: RE | Admit: 2023-06-11 | Discharge: 2023-06-11 | Disposition: A | Discharge status: Home / Self Care | Payer: 59 | Source: Ambulatory Visit | Attending: Family Medicine | Admitting: Family Medicine

## 2023-06-11 VITALS — BP 136/89 | HR 78 | Temp 99.5°F | Resp 20

## 2023-06-11 DIAGNOSIS — J0191 Acute recurrent sinusitis, unspecified: Secondary | ICD-10-CM

## 2023-06-11 DIAGNOSIS — R051 Acute cough: Secondary | ICD-10-CM

## 2023-06-11 MED ORDER — PREDNISONE 10 MG PO TABS
ORAL_TABLET | ORAL | 0 refills | Status: AC
  Filled 2023-06-11: qty 21, 6d supply, fill #0

## 2023-06-11 MED ORDER — LEVOFLOXACIN 750 MG PO TABS
750.0000 mg | ORAL_TABLET | Freq: Every day | ORAL | 0 refills | Status: AC
  Filled 2023-06-11: qty 5, 5d supply, fill #0

## 2023-06-11 NOTE — ED Provider Notes (Signed)
 [facility]    CSN: [id number] Arrival date & time: 06/11/23  9072      History   Chief Complaint Chief Complaint  Patient presents with   Nasal Congestion    I have had nasal congestion for a few weeks but bow occasionally I have a snot that gets stuck in the back of my throat causing me to gage. Also thick yellow/green mucus pligs from lungs - Entered by patient    HPI Elizabeth Bowers is a 58 y.o. female.   HPI  History obtained from the patient. Elizabeth Bowers presents for nasal congestion for the past 2-3 weeks. The drainage goes down the back of her throat. She is coughing up mucus plugs.   Has some shortness of breath. No wheezing, fever, vomiting and diarrhea. Has headache. OTC medications have not worked.    Has history of MRSA. Previously needed to have her sinuses scrapped.Has sinus issues a lot.       Past Medical History:  Diagnosis Date   Fatty liver    Fibromyalgia    GERD (gastroesophageal reflux disease)    Hemorrhoids    Hot flashes    Hypertension    IBS (irritable bowel syndrome)    LUQ abdominal pain 10/25/2016   Migraines    Miscarriage    x 2; 4 live births    Multilevel degenerative disc disease    Neuroma    Vocal cord dysfunction     Patient Active Problem List   Diagnosis Date Noted   History of colonic polyps 01/01/2023   OAB (overactive bladder) 10/26/2022   Cervical radiculopathy at C6 (Right) 04/04/2022   Chronic hip pain (Right) 08/16/2021   Impaired range of motion of hip (Right) 08/16/2021   Chronic lower extremity pain (Right) 08/16/2021   Lower extremity radicular pain (Right) 08/16/2021   Lumbar radiculitis (L3/L4) (Right) 08/16/2021   Morton's neuroma of foot (Left) 11/15/2020   MDD (major depressive disorder) 10/01/2020   Anterior tibialis tendinitis, right 04/28/2020   Insomnia 07/07/2019   Benign essential hypertension 04/22/2019   Radial styloid tenosynovitis of left hand 01/13/2019   Pain in right hand  01/13/2019   Bilateral carpal tunnel syndrome 01/13/2019   Chronic pain of left wrist 01/13/2019   Chronic low back pain (Bilateral) w/o sciatica 08/08/2018   Abnormal MRI, cervical spine (07/10/2018) 07/17/2018   Cervical spondylitis with radiculitis (HCC) (C6) (Bilateral) (R>L) 06/26/2018   Chronic sacroiliac joint pain (Bilateral) (R>L) 06/26/2018   Cervical Grade 1 (2 mm) Retrolisthesis C5 over C6 03/20/2018   Cervical foraminal stenosis (C3-4 & C5-6) (Bilateral) 03/20/2018   DDD (degenerative disc disease), cervical 03/20/2018   Stage 3 chronic kidney disease (HCC) 03/20/2018   Acid reflux disease 03/20/2018   Spondylosis without myelopathy or radiculopathy, cervical region 03/20/2018   Chronic hip pain (Bilateral) (L>R) 03/20/2018   Lumbar facet syndrome (Bilateral) (R>L) 03/20/2018   Spondylosis without myelopathy or radiculopathy, lumbar region 03/20/2018   Chronic Sacroiliac joint dysfunction (Bilateral) (R>L) 03/20/2018   Chronic Somatic dysfunction of sacroiliac joint (Bilateral) (R>L) 03/20/2018   Osteoarthritis of hip (Bilateral) 03/20/2018   Cervicogenic headache (Bilateral) 03/20/2018   Cervicalgia (Bilateral) (R>L) 03/20/2018   Cervical facet syndrome (Bilateral) (R>L) 03/20/2018   Chronic occipital neuralgia (Bilateral) 03/20/2018   Chronic upper extremity pain (4th area of Pain) (Bilateral) (R>L) 03/19/2018   Ganglion cyst 02/20/2018   Chronic low back pain (2ry area of Pain) (Bilateral) (L>R) w/ sciatica (Bilateral) 01/16/2018   Chronic lower extremity pain (3ry  area of Pain) (Bilateral) (L>R) 01/16/2018   Chronic neck pain (1ry area of Pain) (Bilateral) (midline) 01/16/2018   Chronic foot pain (Left) 01/16/2018   Chronic pain syndrome 01/16/2018   Disorder of skeletal system 01/16/2018   Class 1 obesity with serious comorbidity and body mass index (BMI) of 32.0 to 32.9 in adult 11/21/2017   Anxiety 09/28/2017   Chronic knee pain (Right) 09/28/2017   Fatty liver  07/09/2017   Colon polyps 07/09/2017   Fibromyalgia 07/09/2017   Sinusitis, chronic 07/09/2017   Allergic rhinitis 07/09/2017   IBS (irritable bowel syndrome) 07/09/2017   Eyelid dermatitis, allergic/contact 07/09/2017   Chronic sacroiliac joint pain (Right) 12/16/2016   DDD (degenerative disc disease), lumbar 07/18/2016   Family hx of colon cancer 07/18/2016    Past Surgical History:  Procedure Laterality Date   ABDOMINAL HYSTERECTOMY     2012 removed cervix h/o abnormal pap    CHOLECYSTECTOMY     2000   COLONOSCOPY     2018 with + polpys and GIB 2/2 polyp removal    COLONOSCOPY WITH PROPOFOL  N/A 12/22/2019   Procedure: COLONOSCOPY WITH PROPOFOL ;  Surgeon: Unk Corinn Skiff, MD;  Location: [facility] ENDOSCOPY;  Service: Gastroenterology;  Laterality: N/A;   COLONOSCOPY WITH PROPOFOL  N/A 12/23/2019   Procedure: COLONOSCOPY WITH PROPOFOL ;  Surgeon: Unk Corinn Skiff, MD;  Location: [facility] ENDOSCOPY;  Service: Gastroenterology;  Laterality: N/A;  Last name pronounced LEE-MA   COLONOSCOPY WITH PROPOFOL  N/A 01/01/2023   Procedure: COLONOSCOPY WITH PROPOFOL ;  Surgeon: Unk Corinn Skiff, MD;  Location: [facility] ENDOSCOPY;  Service: Gastroenterology;  Laterality: N/A;   ELBOW DEBRIDEMENT     ESOPHAGOGASTRODUODENOSCOPY (EGD) WITH PROPOFOL  N/A 02/06/2018   Procedure: ESOPHAGOGASTRODUODENOSCOPY (EGD) WITH PROPOFOL  with biopsies;  Surgeon: Unk Corinn Skiff, MD;  Location: [facility] SURGERY CNTR;  Service: Endoscopy;  Laterality: N/A;   ETHMOIDECTOMY N/A 09/28/2021   Procedure: TOTAL ETHMOIDECTOMY;  Surgeon: Milissa Hamming, MD;  Location: [facility] SURGERY [facility];  Service: ENT;  Laterality: N/A;   FOOT SURGERY     IMAGE GUIDED SINUS SURGERY N/A 09/28/2021   Procedure: IMAGE GUIDED SINUS SURGERY;  Surgeon: Milissa Hamming, MD;  Location: [facility] SURGERY CNTR;  Service: ENT;  Laterality: N/A;  PLACED DISK ON OR CHARGE NURSE DESK 4-21  KP   MAXILLARY ANTROSTOMY Bilateral 09/28/2021   Procedure: MAXILLARY  ANTROSTOMY WITHOUT TISSUE REMOVAL;  Surgeon: Milissa Hamming, MD;  Location: [facility] SURGERY CNTR;  Service: ENT;  Laterality: Bilateral;   POLYPECTOMY  01/01/2023   Procedure: POLYPECTOMY;  Surgeon: Unk Corinn Skiff, MD;  Location: [facility] ENDOSCOPY;  Service: Gastroenterology;;   CLEONE Bilateral 09/28/2021   Procedure: SPHENOIDOTOMY;  Surgeon: Milissa Hamming, MD;  Location: [facility] SURGERY CNTR;  Service: ENT;  Laterality: Bilateral;    OB History   No obstetric history on file.      Home Medications    Prior to Admission medications   Medication Sig Start Date End Date Taking? Authorizing Provider  amLODipine  (NORVASC ) 10 MG tablet Take 1 tablet (10 mg) by mouth daily. 04/09/23  Yes Bacigalupo, Jon HERO, MD  Atogepant  (QULIPTA ) 60 MG TABS Take 1 tablet (60 mg total) by mouth daily. 03/21/23  Yes   buPROPion  (WELLBUTRIN  XL) 300 MG 24 hr tablet Take 1 tablet (300 mg total) by mouth daily. 05/01/23  Yes Pardue, Lauraine SAILOR, DO  DULoxetine  (CYMBALTA ) 60 MG capsule Take 1 capsule (60 mg total) by mouth daily. 05/07/23  Yes Bacigalupo, Jon HERO, MD  hydrOXYzine  (ATARAX ) 10 MG tablet Take 1 tablet (10 mg  total) by mouth 3 (three) times daily as needed. 05/08/23  Yes Bacigalupo, Jon HERO, MD  lansoprazole  (PREVACID ) 30 MG capsule Take 1 capsule (30 mg total) by mouth 2 (two) times daily before a meal. 05/15/23  Yes Bacigalupo, Jon HERO, MD  levofloxacin  (LEVAQUIN ) 750 MG tablet Take 1 tablet (750 mg total) by mouth daily for 5 days. 06/11/23 06/16/23 Yes Maryela Tapper, DO  LINZESS  290 MCG CAPS capsule Take 1 capsule (290 mcg total) by mouth daily. 04/09/23  Yes Bacigalupo, Angela M, MD  Multiple Vitamin (MULTIVITAMIN WITH MINERALS) TABS tablet Take 1 tablet by mouth daily.   Yes [provider]  ondansetron  (ZOFRAN -ODT) 8 MG disintegrating tablet Take 1 tablet (8 mg total) by mouth every 8 (eight) hours as needed for nausea or vomiting. 11/27/22  Yes Abem Shaddix, DO  oxybutynin   (DITROPAN  XL) 10 MG 24 hr tablet Take 1 tablet (10 mg total) by mouth at bedtime. 06/04/23  Yes Bacigalupo, Jon HERO, MD  predniSONE  (DELTASONE ) 10 MG tablet Take 6 tablets (60 mg total) by mouth daily for 1 day, THEN 5 tablets (50 mg total) daily for 1 day, THEN 4 tablets (40 mg total) daily for 1 day, THEN 3 tablets (30 mg total) daily for 1 day, THEN 2 tablets (20 mg total) daily for 1 day, THEN 1 tablet (10 mg total) daily for 1 day. 06/11/23 06/17/23 Yes Lister Brizzi, DO  rizatriptan  (MAXALT -MLT) 5 MG disintegrating tablet Dissolve 1 tablet (5 mg total) in the mouth as directed for Migraine. May take a second dose after 2 hours if needed. 05/14/23  Yes   triamterene -hydrochlorothiazide  (MAXZIDE -25) 37.5-25 MG tablet Take 1 tablet by mouth daily. 05/29/23  Yes Bacigalupo, Jon HERO, MD  Ubrogepant  (UBRELVY ) 50 MG TABS Take 1 tablet (50 mg total) by mouth once as needed for up to 1 dose. Take 50 mg as a single dose; if symptoms persist or return, may repeat dose after 2 hours. Maximum: 200 mg per 24 hours. 12/28/22  Yes   propranolol  (INDERAL ) 20 MG tablet Take 1 tablet (20 mg total) by mouth 2 (two) times daily. 03/21/23       Family History Family History  Problem Relation Age of Onset   Hypertension Mother    Hyperlipidemia Mother    Glaucoma Mother    Diabetes Mother        type 2    COPD Father        emphysema   Alcohol abuse Father    Colon cancer Sister    Cancer Sister        rectal/colon cancer no h/o IBD   Colon polyps Sister    Asthma Sister    COPD Sister    Kidney disease Sister    Mental illness Sister        bipolar    Miscarriages / Stillbirths Sister    Uterine cancer Sister    Alcohol abuse Son    Depression Son    Diabetes Son    Drug abuse Son    Mental illness Son        bipolar    Diabetes Maternal Grandmother    COPD Paternal Grandmother    Cancer Paternal Grandfather        FH colon cancer maternal aunts/uncles    Miscarriages / Stillbirths Sister     Arthritis Son    Arthritis Son    Breast cancer Neg Hx     Social History Social History   Tobacco Use  Smoking status: Former    Current packs/day: 0.00    Average packs/day: 0.8 packs/day for 5.0 years (3.8 ttl pk-yrs)    Types: Cigarettes    Start date: 07/05/1982    Quit date: 07/06/1987    Years since quitting: 35.9    Passive exposure: Never   Smokeless tobacco: Never  Vaping Use   Vaping status: Never Used  Substance Use Topics   Alcohol use: Yes    Alcohol/week: 2.0 - 4.0 standard drinks of alcohol    Types: 2 - 4 Cans of beer per week    Comment: wine cooler   Drug use: Never     Allergies   Gluten meal, Lactose, Ranitidine, Amphetamine-dextroamphetamine, Lactose intolerance (gi), Reglan [metoclopramide], Topiramate  er, Wheat, Soap, and Sulfa antibiotics   Review of Systems Review of Systems: negative unless otherwise stated in HPI.      Physical Exam Triage Vital Signs ED Triage Vitals  Encounter Vitals Group     BP 06/11/23 0958 136/89     Systolic BP Percentile --      Diastolic BP Percentile --      Pulse Rate 06/11/23 0958 78     Resp 06/11/23 0958 20     Temp 06/11/23 0958 99.5 F (37.5 C)     Temp Source 06/11/23 0958 Oral     SpO2 06/11/23 0958 96 %     Weight --      Height --      Head Circumference --      Peak Flow --      Pain Score 06/11/23 0957 0     Pain Loc --      Pain Education --      Exclude from Growth Chart --    No data found.  Updated Vital Signs BP 136/89 (BP Location: Right Arm)   Pulse 78   Temp 99.5 F (37.5 C) (Oral)   Resp 20   SpO2 96%   Visual Acuity Right Eye Distance:   Left Eye Distance:   Bilateral Distance:    Right Eye Near:   Left Eye Near:    Bilateral Near:     Physical Exam GEN:     alert, non-toxic appearing female in no distress    HENT:  mucus membranes moist, thick nasal discharge  EYES:   no scleral injection or discharge NECK:  normal ROM, no meningismus   RESP:  no increased  work of breathing, clear to auscultation bilaterally CVS:   regular rate and rhythm Skin:   warm and dry    UC Treatments / Results  Labs (all labs ordered are listed, but only abnormal results are displayed) Labs Reviewed - No data to display  EKG   Radiology No results found.  Procedures Procedures (including critical care time)  Medications Ordered in UC Medications - No data to display  Initial Impression / Assessment and Plan / UC Course  I have reviewed the triage vital signs and the nursing notes.  Pertinent labs & imaging results that were available during my care of the patient were reviewed by me and considered in my medical decision making (see chart for details).      Pt is a 58 y.o. female who presents for sinus congestion and pain for past 2-3 weeks.  On chart review, has history of maxillary antrostomy, ethmoidectomy and sphenoidectomy in April 2023 by Dr Milissa.  Erva is afebrile without recent use of antipyretics.  Per ENTs note she has history  of recurrent pansinusitis.    She is satting well on room air. Overall pt is well appearing, well hydrated, without respiratory distress. COVID testing deferred due to duration of symptoms.   Suspect bacterial cause. Treat with Levaquin  as this has worked well for her in the past.  Risks of C. difficile discussed with patient and she accepts this risk.  Levaquin  and prednisone  sent to pharmacy.  Discussed typical duration of symptoms. OTC symptom care as needed. Ensure adequate fluid intake and rest.   Reviewed expectations re: course of current medical issues. Questions answered. Return and ED precautions given.  Patient verbalized understanding. After Visit Summary given.  Discussed MDM, treatment plan and plan for follow-up with patient who agrees with plan.     Final Clinical Impressions(s) / UC Diagnoses   Final diagnoses:  Acute cough  Acute recurrent sinusitis, unspecified location     Discharge  Instructions      Stop by the pharmacy to pick up your prescriptions.  Follow up with your primary care provider as needed.      ED Prescriptions     Medication Sig Dispense Auth. Provider   levofloxacin  (LEVAQUIN ) 750 MG tablet Take 1 tablet (750 mg total) by mouth daily for 5 days. 5 tablet Jehan Ranganathan, DO   predniSONE  (DELTASONE ) 10 MG tablet Take 6 tablets (60 mg total) by mouth daily for 1 day, THEN 5 tablets (50 mg total) daily for 1 day, THEN 4 tablets (40 mg total) daily for 1 day, THEN 3 tablets (30 mg total) daily for 1 day, THEN 2 tablets (20 mg total) daily for 1 day, THEN 1 tablet (10 mg total) daily for 1 day. 21 tablet Hayslee Casebolt, DO      PDMP not reviewed this encounter.   Keila Turan, DO 06/11/23 1035

## 2023-06-11 NOTE — ED Triage Notes (Signed)
 Productive cough x 3 weeks with yellow mucus Post nasal drip

## 2023-06-11 NOTE — Discharge Instructions (Addendum)
 Stop by the pharmacy to pick up your prescriptions.  Follow up with your primary care provider as needed.

## 2023-06-13 ENCOUNTER — Other Ambulatory Visit: Payer: Self-pay

## 2023-06-14 NOTE — Telephone Encounter (Signed)
 She can stop the medication and we can refer to urology for further evaluation/management.

## 2023-06-26 ENCOUNTER — Other Ambulatory Visit (HOSPITAL_COMMUNITY): Payer: Self-pay

## 2023-06-29 ENCOUNTER — Other Ambulatory Visit (HOSPITAL_COMMUNITY): Payer: Self-pay

## 2023-06-29 ENCOUNTER — Other Ambulatory Visit: Payer: Self-pay

## 2023-06-29 ENCOUNTER — Other Ambulatory Visit (HOSPITAL_BASED_OUTPATIENT_CLINIC_OR_DEPARTMENT_OTHER): Payer: Self-pay

## 2023-07-18 ENCOUNTER — Other Ambulatory Visit (HOSPITAL_BASED_OUTPATIENT_CLINIC_OR_DEPARTMENT_OTHER): Payer: Self-pay

## 2023-07-30 ENCOUNTER — Other Ambulatory Visit: Payer: Self-pay | Admitting: Family Medicine

## 2023-07-31 ENCOUNTER — Other Ambulatory Visit (HOSPITAL_COMMUNITY): Payer: Self-pay

## 2023-07-31 ENCOUNTER — Other Ambulatory Visit: Payer: Self-pay

## 2023-07-31 MED ORDER — DULOXETINE HCL 60 MG PO CPEP
60.0000 mg | ORAL_CAPSULE | Freq: Every day | ORAL | 1 refills | Status: DC
  Filled 2023-07-31: qty 90, 90d supply, fill #0

## 2023-07-31 NOTE — Telephone Encounter (Signed)
 Requested Prescriptions  Pending Prescriptions Disp Refills   DULoxetine (CYMBALTA) 60 MG capsule 90 capsule 1    Sig: Take 1 capsule (60 mg total) by mouth daily.     Psychiatry: Antidepressants - SNRI - duloxetine Passed - 07/31/2023  4:58 PM      Passed - Cr in normal range and within 360 days    Creatinine, Ser  Date Value Ref Range Status  01/02/2023 0.94 0.44 - 1.00 mg/dL Final         Passed - eGFR is 30 or above and within 360 days    GFR calc Af Amer  Date Value Ref Range Status  07/07/2019 >60 >60 mL/min Final   GFR, Estimated  Date Value Ref Range Status  01/02/2023 >60 >60 mL/min Final    Comment:    (NOTE) Calculated using the CKD-EPI Creatinine Equation (2021)    eGFR  Date Value Ref Range Status  11/02/2022 65 >59 mL/min/1.73 Final         Passed - Completed PHQ-2 or PHQ-9 in the last 360 days      Passed - Last BP in normal range    BP Readings from Last 1 Encounters:  06/11/23 136/89         Passed - Valid encounter within last 6 months    Recent Outpatient Visits           2 months ago Benign essential hypertension   [place] [facility] [place], Marzella Schlein, MD   9 months ago Encounter for annual physical exam   [facility] [place], Marzella Schlein, MD   1 year ago Urethra disorder   [place] [facility] [place], Marzella Schlein, MD   1 year ago Benign essential hypertension   [place] [facility] [place], Marzella Schlein, MD   1 year ago Encounter for annual physical exam    [facility] [place], Marzella Schlein, MD       Future Appointments             In 3 months Bacigalupo, Marzella Schlein, MD [facility]

## 2023-08-10 ENCOUNTER — Ambulatory Visit
Admission: RE | Admit: 2023-08-10 | Discharge: 2023-08-10 | Disposition: A | Discharge status: Home / Self Care | Source: Ambulatory Visit | Attending: Emergency Medicine | Admitting: Emergency Medicine

## 2023-08-10 VITALS — BP 125/79 | HR 97 | Temp 99.1°F | Resp 19

## 2023-08-10 DIAGNOSIS — J0100 Acute maxillary sinusitis, unspecified: Secondary | ICD-10-CM

## 2023-08-10 MED ORDER — IPRATROPIUM BROMIDE 0.06 % NA SOLN: 2.0000 | Freq: Four times a day (QID) | NASAL | 12 refills | Status: AC

## 2023-08-10 MED ORDER — AMOXICILLIN-POT CLAVULANATE 875-125 MG PO TABS: 1.0000 | ORAL_TABLET | Freq: Two times a day (BID) | ORAL | 0 refills | Status: AC

## 2023-08-10 MED ORDER — PROMETHAZINE-DM 6.25-15 MG/5ML PO SYRP: 5.0000 mL | ORAL_SOLUTION | Freq: Four times a day (QID) | ORAL | 0 refills | Status: DC | PRN

## 2023-08-10 MED ORDER — BENZONATATE 100 MG PO CAPS: 200.0000 mg | ORAL_CAPSULE | Freq: Three times a day (TID) | ORAL | 0 refills | Status: DC

## 2023-08-10 NOTE — ED Provider Notes (Signed)
 [facility]    CSN: [id number] Arrival date & time: 08/10/23  1552      History   Chief Complaint Chief Complaint  Patient presents with   Nasal Congestion    I have sinus pain, green/blood discharge when blowing nose - Entered by patient   Fatigue   Facial Pain    HPI Elizabeth Bowers is a 58 y.o. female.   HPI  58 year old female with past medical history significant for hypertension, fatty liver, GERD, fibromyalgia, IBS, migraine headaches, and recurrent sinus infections presents for evaluation of pain in her maxillary sinuses with bloody green nasal discharge, postnasal drip, nonproductive cough, and shortness of breath that is been going on for the last 2 weeks.  She denies any fever or wheezing.  Past Medical History:  Diagnosis Date   Fatty liver    Fibromyalgia    GERD (gastroesophageal reflux disease)    Hemorrhoids    Hot flashes    Hypertension    IBS (irritable bowel syndrome)    LUQ abdominal pain 10/25/2016   Migraines    Miscarriage    x 2; 4 live births    Multilevel degenerative disc disease    Neuroma    Vocal cord dysfunction     Patient Active Problem List   Diagnosis Date Noted   History of colonic polyps 01/01/2023   OAB (overactive bladder) 10/26/2022   Cervical radiculopathy at C6 (Right) 04/04/2022   Chronic hip pain (Right) 08/16/2021   Impaired range of motion of hip (Right) 08/16/2021   Chronic lower extremity pain (Right) 08/16/2021   Lower extremity radicular pain (Right) 08/16/2021   Lumbar radiculitis (L3/L4) (Right) 08/16/2021   Morton's neuroma of foot (Left) 11/15/2020   MDD (major depressive disorder) 10/01/2020   Anterior tibialis tendinitis, right 04/28/2020   Insomnia 07/07/2019   Benign essential hypertension 04/22/2019   Radial styloid tenosynovitis of left hand 01/13/2019   Pain in right hand 01/13/2019   Bilateral carpal tunnel syndrome 01/13/2019   Chronic pain of left wrist 01/13/2019   Chronic low  back pain (Bilateral) w/o sciatica 08/08/2018   Abnormal MRI, cervical spine (07/10/2018) 07/17/2018   Cervical spondylitis with radiculitis (HCC) (C6) (Bilateral) (R>L) 06/26/2018   Chronic sacroiliac joint pain (Bilateral) (R>L) 06/26/2018   Cervical Grade 1 (2 mm) Retrolisthesis C5 over C6 03/20/2018   Cervical foraminal stenosis (C3-4 & C5-6) (Bilateral) 03/20/2018   DDD (degenerative disc disease), cervical 03/20/2018   Stage 3 chronic kidney disease (HCC) 03/20/2018   Acid reflux disease 03/20/2018   Spondylosis without myelopathy or radiculopathy, cervical region 03/20/2018   Chronic hip pain (Bilateral) (L>R) 03/20/2018   Lumbar facet syndrome (Bilateral) (R>L) 03/20/2018   Spondylosis without myelopathy or radiculopathy, lumbar region 03/20/2018   Chronic Sacroiliac joint dysfunction (Bilateral) (R>L) 03/20/2018   Chronic Somatic dysfunction of sacroiliac joint (Bilateral) (R>L) 03/20/2018   Osteoarthritis of hip (Bilateral) 03/20/2018   Cervicogenic headache (Bilateral) 03/20/2018   Cervicalgia (Bilateral) (R>L) 03/20/2018   Cervical facet syndrome (Bilateral) (R>L) 03/20/2018   Chronic occipital neuralgia (Bilateral) 03/20/2018   Chronic upper extremity pain (4th area of Pain) (Bilateral) (R>L) 03/19/2018   Ganglion cyst 02/20/2018   Chronic low back pain (2ry area of Pain) (Bilateral) (L>R) w/ sciatica (Bilateral) 01/16/2018   Chronic lower extremity pain (3ry area of Pain) (Bilateral) (L>R) 01/16/2018   Chronic neck pain (1ry area of Pain) (Bilateral) (midline) 01/16/2018   Chronic foot pain (Left) 01/16/2018   Chronic pain syndrome 01/16/2018   Disorder  of skeletal system 01/16/2018   Class 1 obesity with serious comorbidity and body mass index (BMI) of 32.0 to 32.9 in adult 11/21/2017   Anxiety 09/28/2017   Chronic knee pain (Right) 09/28/2017   Fatty liver 07/09/2017   Colon polyps 07/09/2017   Fibromyalgia 07/09/2017   Sinusitis, chronic 07/09/2017   Allergic  rhinitis 07/09/2017   IBS (irritable bowel syndrome) 07/09/2017   Eyelid dermatitis, allergic/contact 07/09/2017   Chronic sacroiliac joint pain (Right) 12/16/2016   DDD (degenerative disc disease), lumbar 07/18/2016   Family hx of colon cancer 07/18/2016    Past Surgical History:  Procedure Laterality Date   ABDOMINAL HYSTERECTOMY     2012 removed cervix h/o abnormal pap    CHOLECYSTECTOMY     2000   COLONOSCOPY     2018 with + polpys and GIB 2/2 polyp removal    COLONOSCOPY WITH PROPOFOL N/A 12/22/2019   Procedure: COLONOSCOPY WITH PROPOFOL;  Surgeon: Toney Reil, MD;  Location[facility] END[facility]COPY;  Service: Gastroenterology;  Laterality: N/A;   COLONOSCOPY WITH PROPOFOL N/A 12/23/2019   Procedure: COLONOSCOPY WITH PROPOFOL;  Surgeon: Toney Reil, MD;  [facility] END[facility];  Service: Gastroenterology;  Laterality: N/A;  Last name pronounced LEE-MA   COLONOSCOPY WITH PROPOFOL N/A 01/01/2023   Procedure: COLONOSCOPY WITH PROPOFOL;  Surgeon: Toney Reil, MD;  Location: [facility] ENDOSCOPY;  Service: Gastroenterology;  Laterality: N/A;   ELBOW DEBRIDEMENT     ESOPHAGOGASTRODUODENOSCOPY (EGD) WITH PROPOFOL N/A 02/06/2018   Procedure: ESOPHAGOGASTRODUODENOSCOPY (EGD) WITH PROPOFOL with biopsies;  Surgeon: Toney Reil, MD;  Location[facility] SUR[facility]ERY CN[facility];  Service: Endoscopy;  Laterality: N/A;   ETHMOIDECTOMY N/A 09/28/2021   Procedure: TOTAL ETHMOIDECTOMY;  Surgeon: Bud Face, MD;  Location: [facility] SURGERY CNTR;  Service: ENT;  Laterality: N/A;   FOOT SURGERY     IMAGE GUIDED SINUS SURGERY N/A 09/28/2021   Procedure: IMAGE GUIDED SINUS SURGERY;  Surgeon: Bud Face, MD;  Location: [facility] SURGERY CNTR;  Service: ENT;  Laterality: N/A;  PLACED DISK ON OR CHARGE NURSE DESK 4-21  KP   MAXILLARY ANTROSTOMY Bilateral 09/28/2021   Procedure: MAXILLARY ANTROSTOMY WITHOUT TISSUE REMOVAL;  Surgeon: Bud Face, MD;  Location: [facility] SURGERY CNTR;  Service: ENT;   Laterality: Bilateral;   POLYPECTOMY  01/01/2023   Procedure: POLYPECTOMY;  Surgeon: Toney Reil, MD;  Location: [facility] ENDOSCOPY;  Service: Gastroenterology;;   Selina Cooley Bilateral 09/28/2021   Procedure: SPHENOIDOTOMY;  Surgeon: Bud Face, MD;  Location: [facility] SURGERY CNTR;  Service: ENT;  Laterality: Bilateral;    OB History   No obstetric history on file.      Home Medications    Prior to Admission medications   Medication Sig Start Date End Date Taking? Authorizing Provider  amLODipine (NORVASC) 10 MG tablet Take 1 tablet (10 mg) by mouth daily. 04/09/23  Yes Bacigalupo, Marzella Schlein, MD  amoxicillin-clavulanate (AUGMENTIN) 875-125 MG tablet Take 1 tablet by mouth every 12 (twelve) hours for 10 days. 08/10/23 08/20/23 Yes Becky Augusta, NP  Atogepant (QULIPTA) 60 MG TABS Take 1 tablet (60 mg total) by mouth daily. 03/21/23  Yes   benzonatate (TESSALON) 100 MG capsule Take 2 capsules (200 mg total) by mouth every 8 (eight) hours. 08/10/23  Yes Becky Augusta, NP  buPROPion (WELLBUTRIN XL) 300 MG 24 hr tablet Take 1 tablet (300 mg total) by mouth daily. 05/01/23  Yes Pardue, Monico Blitz, DO  DULoxetine (CYMBALTA) 60 MG capsule Take 1 capsule (60 mg total) by mouth daily. 07/31/23  Yes Bacigalupo,  Marzella Schlein, MD  hydrOXYzine (ATARAX) 10 MG tablet Take 1 tablet (10 mg total) by mouth 3 (three) times daily as needed. 05/08/23  Yes Bacigalupo, Marzella Schlein, MD  ipratropium (ATROVENT) 0.06 % nasal spray Place 2 sprays into both nostrils 4 (four) times daily. 08/10/23  Yes Becky Augusta, NP  lansoprazole (PREVACID) 30 MG capsule Take 1 capsule (30 mg total) by mouth 2 (two) times daily before a meal. 05/15/23  Yes Bacigalupo, Marzella Schlein, MD  LINZESS 290 MCG CAPS capsule Take 1 capsule (290 mcg total) by mouth daily. 04/09/23  Yes Bacigalupo, Marzella Schlein, MD  Multiple Vitamin (MULTIVITAMIN WITH MINERALS) TABS tablet Take 1 tablet by mouth daily.   Yes [provider]  ondansetron (ZOFRAN-ODT) 8 MG  disintegrating tablet Take 1 tablet (8 mg total) by mouth every 8 (eight) hours as needed for nausea or vomiting. 11/27/22  Yes Brimage, Vondra, DO  oxybutynin (DITROPAN XL) 10 MG 24 hr tablet Take 1 tablet (10 mg total) by mouth at bedtime. 06/04/23  Yes Bacigalupo, Marzella Schlein, MD  promethazine-dextromethorphan (PROMETHAZINE-DM) 6.25-15 MG/5ML syrup Take 5 mLs by mouth 4 (four) times daily as needed. 08/10/23  Yes Becky Augusta, NP  rizatriptan (MAXALT-MLT) 5 MG disintegrating tablet Dissolve 1 tablet (5 mg total) in the mouth as directed for Migraine. May take a second dose after 2 hours if needed. 05/14/23  Yes   triamterene-hydrochlorothiazide (MAXZIDE-25) 37.5-25 MG tablet Take 1 tablet by mouth daily. 05/29/23  Yes Bacigalupo, Marzella Schlein, MD  Ubrogepant (UBRELVY) 50 MG TABS Take 1 tablet (50 mg total) by mouth once as needed for up to 1 dose. Take 50 mg as a single dose; if symptoms persist or return, may repeat dose after 2 hours. Maximum: 200 mg per 24 hours. 12/28/22  Yes   propranolol (INDERAL) 20 MG tablet Take 1 tablet (20 mg total) by mouth 2 (two) times daily. 03/21/23       Family History Family History  Problem Relation Age of Onset   Hypertension Mother    Hyperlipidemia Mother    Glaucoma Mother    Diabetes Mother        type 2    COPD Father        emphysema   Alcohol abuse Father    Colon cancer Sister    Cancer Sister        rectal/colon cancer no h/o IBD   Colon polyps Sister    Asthma Sister    COPD Sister    Kidney disease Sister    Mental illness Sister        bipolar    Miscarriages / [place] Sister    Uterine cancer Sister    Alcohol abuse Son    Depression Son    Diabetes Son    Drug abuse Son    Mental illness Son        bipolar    Diabetes Maternal Grandmother    COPD Paternal Grandmother    Cancer Paternal Grandfather        FH colon cancer maternal aunts/uncles    Miscarriages / Stillbirths Sister    Arthritis Son    Arthritis Son    Breast cancer  Neg Hx     Social History Social History   Tobacco Use   Smoking status: Former    Current packs/day: 0.00    Average packs/day: 0.8 packs/day for 5.0 years (3.8 ttl pk-yrs)    Types: Cigarettes    Start date: 07/05/1982  Quit date: 07/06/1987    Years since quitting: 36.1    Passive exposure: Never   Smokeless tobacco: Never  Vaping Use   Vaping status: Never Used  Substance Use Topics   Alcohol use: Yes    Alcohol/week: 2.0 - 4.0 standard drinks of alcohol    Types: 2 - 4 Cans of beer per week    Comment: wine cooler   Drug use: Never     Allergies   Gluten meal, Lactose, Ranitidine, Sulfa antibiotics, Amphetamine-dextroamphetamine, Lactose intolerance (gi), Reglan [metoclopramide], Topiramate er, Wheat, and Soap   Review of Systems Review of Systems  Constitutional:  Negative for fever.  HENT:  Positive for congestion, postnasal drip, rhinorrhea, sinus pressure and sinus pain.   Respiratory:  Positive for cough and shortness of breath. Negative for wheezing.      Physical Exam Triage Vital Signs ED Triage Vitals  Encounter Vitals Group     BP 08/10/23 1603 125/79     Systolic BP Percentile --      Diastolic BP Percentile --      Pulse Rate 08/10/23 1603 97     Resp 08/10/23 1603 19     Temp 08/10/23 1603 99.1 F (37.3 C)     Temp Source 08/10/23 1603 Oral     SpO2 08/10/23 1603 98 %     Weight --      Height --      Head Circumference --      Peak Flow --      Pain Score 08/10/23 1602 4     Pain Loc --      Pain Education --      Exclude from Growth Chart --    No data found.  Updated Vital Signs BP 125/79 (BP Location: Right Arm)   Pulse 97   Temp 99.1 F (37.3 C) (Oral)   Resp 19   SpO2 98%   Visual Acuity Right Eye Distance:   Left Eye Distance:   Bilateral Distance:    Right Eye Near:   Left Eye Near:    Bilateral Near:     Physical Exam Vitals and nursing note reviewed.  Constitutional:      Appearance: Normal appearance. She  is not ill-appearing.  HENT:     Head: Normocephalic and atraumatic.     Nose: Congestion and rhinorrhea present.     Comments: Nasal mucosa is edematous and erythematous with thick yellow discharge in both nares.  Bilateral maxillary sinuses are tender to compression.    Mouth/Throat:     Mouth: Mucous membranes are moist.     Pharynx: Oropharynx is clear. No oropharyngeal exudate or posterior oropharyngeal erythema.  Cardiovascular:     Rate and Rhythm: Normal rate and regular rhythm.     Pulses: Normal pulses.     Heart sounds: Normal heart sounds. No murmur heard.    No friction rub. No gallop.  Pulmonary:     Effort: Pulmonary effort is normal.     Breath sounds: Normal breath sounds. No wheezing, rhonchi or rales.  Musculoskeletal:     Cervical back: Normal range of motion and neck supple. No tenderness.  Lymphadenopathy:     Cervical: No cervical adenopathy.  Skin:    General: Skin is warm and dry.     Capillary Refill: Capillary refill takes less than 2 seconds.     Findings: No erythema or rash.  Neurological:     General: No focal deficit present.  Mental Status: She is alert and oriented to person, place, and time.      UC Treatments / Results  Labs (all labs ordered are listed, but only abnormal results are displayed) Labs Reviewed - No data to display  EKG   Radiology No results found.  Procedures Procedures (including critical care time)  Medications Ordered in UC Medications - No data to display  Initial Impression / Assessment and Plan / UC Course  I have reviewed the triage vital signs and the nursing notes.  Pertinent labs & imaging results that were available during my care of the patient were reviewed by me and considered in my medical decision making (see chart for details).   Patient is a nontoxic-appearing 58 year old female presenting for evaluation of sinus pain and pressure as outlined HPI above.  On exam she does have marked edema and  erythema of her nasal passages with thick yellow-green discharge present in the left nare.  No bloody discharge noted.  She does have tenderness to compression of bilateral maxillary sinuses.  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  Cardiopulmonary exam reveals: Sounds in all fields.  Patient's physical exam is consistent with maxillary sinusitis.  I will discharge her home on 10 days of Augmentin 875 mg twice daily.  Atrovent nasal spray to help with nasal congestion.  Tessalon Perles) DM cough syrup for cough and congestion.  She should continue her sinus rinses to help alleviate her mucus burden.  If her symptoms or not improved she should follow-up with ENT.   Final Clinical Impressions(s) / UC Diagnoses   Final diagnoses:  Acute non-recurrent maxillary sinusitis     Discharge Instructions      The Augmentin twice daily with food for 10 days for treatment of your sinusitis.  Perform sinus irrigation 2-3 times a day with a [employer]Med sinus rinse kit and distilled water.  Do not use tap water.  You can use plain over-the-counter Mucinex every 6 hours to break up the stickiness of the mucus so your body can clear it.  Increase your oral fluid intake to thin out your mucus so that is also able for your body to clear more easily.  Take an over-the-counter probiotic, such as Culturelle-align-activia, 1 hour after each dose of antibiotic to prevent diarrhea.  Use the Atrovent nasal spray, 2 squirts in each nostril every 6 hours, as needed for runny nose and postnasal drip.  Use the Tessalon Perles every 8 hours during the day.  Take them with a small sip of water.  They may give you some numbness to the base of your tongue or a metallic taste in your mouth, this is normal.  Use the Promethazine DM cough syrup at bedtime for cough and congestion.  It will make you drowsy so do not take it during the day.  Return for reevaluation or see your primary care provider for any  new or worsening symptoms.      ED Prescriptions     Medication Sig Dispense Auth. Provider   amoxicillin-clavulanate (AUGMENTIN) 875-125 MG tablet Take 1 tablet by mouth every 12 (twelve) hours for 10 days. 20 tablet Becky Augusta, NP   benzonatate (TESSALON) 100 MG capsule Take 2 capsules (200 mg total) by mouth every 8 (eight) hours. 21 capsule Becky Augusta, NP   ipratropium (ATROVENT) 0.06 % nasal spray Place 2 sprays into both nostrils 4 (four) times daily. 15 mL Becky Augusta, NP   promethazine-dextromethorphan (PROMETHAZINE-DM) 6.25-15 MG/5ML syrup Take 5  mLs by mouth 4 (four) times daily as needed. 118 mL Becky Augusta, NP      PDMP not reviewed this encounter.   Becky Augusta, NP 08/10/23 [phone]

## 2023-08-10 NOTE — ED Triage Notes (Signed)
 Sx x 2 weeks  Facial pain Productive cough Post nasal drip-green fatigue

## 2023-08-10 NOTE — Discharge Instructions (Addendum)
 The Augmentin twice daily with food for 10 days for treatment of your sinusitis.  Perform sinus irrigation 2-3 times a day with a NeilMed sinus rinse kit and distilled water.  Do not use tap water.  You can use plain over-the-counter Mucinex every 6 hours to break up the stickiness of the mucus so your body can clear it.  Increase your oral fluid intake to thin out your mucus so that is also able for your body to clear more easily.  Take an over-the-counter probiotic, such as Culturelle-align-activia, 1 hour after each dose of antibiotic to prevent diarrhea.  Use the Atrovent nasal spray, 2 squirts in each nostril every 6 hours, as needed for runny nose and postnasal drip.  Use the Tessalon Perles every 8 hours during the day.  Take them with a small sip of water.  They may give you some numbness to the base of your tongue or a metallic taste in your mouth, this is normal.  Use the Promethazine DM cough syrup at bedtime for cough and congestion.  It will make you drowsy so do not take it during the day.  Return for reevaluation or see your primary care provider for any new or worsening symptoms.

## 2023-08-11 ENCOUNTER — Other Ambulatory Visit (HOSPITAL_COMMUNITY): Payer: Self-pay

## 2023-08-14 ENCOUNTER — Other Ambulatory Visit (HOSPITAL_COMMUNITY): Payer: Self-pay

## 2023-08-25 ENCOUNTER — Other Ambulatory Visit: Payer: Self-pay | Admitting: Family Medicine

## 2023-08-25 ENCOUNTER — Other Ambulatory Visit (HOSPITAL_COMMUNITY): Payer: Self-pay

## 2023-08-27 ENCOUNTER — Other Ambulatory Visit: Payer: Self-pay

## 2023-08-27 ENCOUNTER — Other Ambulatory Visit (HOSPITAL_COMMUNITY): Payer: Self-pay

## 2023-08-27 MED ORDER — HYDROXYZINE HCL 10 MG PO TABS
10.0000 mg | ORAL_TABLET | Freq: Three times a day (TID) | ORAL | 1 refills | Status: AC | PRN
  Filled 2023-08-27: qty 60, 20d supply, fill #0

## 2023-08-27 NOTE — Telephone Encounter (Signed)
 Requested Prescriptions  Pending Prescriptions Disp Refills   hydrOXYzine (ATARAX) 10 MG tablet 60 tablet 1    Sig: Take 1 tablet (10 mg total) by mouth 3 (three) times daily as needed.     Ear, Nose, and Throat:  Antihistamines 2 Passed - 08/27/2023  3:51 PM      Passed - Cr in normal range and within 360 days    Creatinine, Ser  Date Value Ref Range Status  01/02/2023 0.94 0.44 - 1.00 mg/dL Final         Passed - Valid encounter within last 12 months    Recent Outpatient Visits           3 months ago Benign essential hypertension   [place] [facility] [place], Marzella Schlein, MD   10 months ago Encounter for annual physical exam   [facility] [place], Marzella Schlein, MD   1 year ago Urethra disorder   [place] [facility] [place], Marzella Schlein, MD   1 year ago Benign essential hypertension   [place] [facility] [place], Marzella Schlein, MD   1 year ago Encounter for annual physical exam   [place] [facility] [place], Marzella Schlein, MD       Future Appointments             In 2 months Bacigalupo, Marzella Schlein, MD [facility]

## 2023-08-28 ENCOUNTER — Other Ambulatory Visit: Payer: Self-pay

## 2023-08-28 ENCOUNTER — Encounter: Payer: Self-pay | Admitting: Pharmacist

## 2023-08-28 ENCOUNTER — Other Ambulatory Visit (HOSPITAL_COMMUNITY): Payer: Self-pay

## 2023-08-28 MED ORDER — UBRELVY 50 MG PO TABS
ORAL_TABLET | ORAL | 0 refills | Status: DC
Start: 2023-08-28 — End: 2023-10-01
  Filled 2023-08-28: qty 10, 30d supply, fill #0

## 2023-08-28 MED ORDER — PROMETHAZINE-DM 6.25-15 MG/5ML PO SYRP
5.0000 mL | ORAL_SOLUTION | Freq: Four times a day (QID) | ORAL | 0 refills | Status: DC | PRN
  Filled 2023-08-28: qty 118, 6d supply, fill #0

## 2023-08-28 MED ORDER — RIZATRIPTAN BENZOATE 5 MG PO TBDP
5.0000 mg | ORAL_TABLET | ORAL | 1 refills | Status: DC
  Filled 2023-08-28: qty 6, 15d supply, fill #0
  Filled 2023-09-30: qty 6, 15d supply, fill #1

## 2023-08-29 ENCOUNTER — Other Ambulatory Visit: Payer: Self-pay

## 2023-08-29 ENCOUNTER — Other Ambulatory Visit (HOSPITAL_COMMUNITY): Payer: Self-pay

## 2023-08-29 DIAGNOSIS — H53149 Visual discomfort, unspecified: Secondary | ICD-10-CM | POA: Diagnosis not present

## 2023-08-29 DIAGNOSIS — F40298 Other specified phobia: Secondary | ICD-10-CM | POA: Diagnosis not present

## 2023-08-29 DIAGNOSIS — G43909 Migraine, unspecified, not intractable, without status migrainosus: Secondary | ICD-10-CM | POA: Diagnosis not present

## 2023-08-29 DIAGNOSIS — R110 Nausea: Secondary | ICD-10-CM | POA: Diagnosis not present

## 2023-08-30 ENCOUNTER — Other Ambulatory Visit: Payer: Self-pay

## 2023-08-30 ENCOUNTER — Emergency Department
Admission: EM | Admit: 2023-08-30 | Discharge: 2023-08-30 | Disposition: A | Discharge status: Home / Self Care | Attending: Emergency Medicine | Admitting: Emergency Medicine

## 2023-08-30 ENCOUNTER — Other Ambulatory Visit (HOSPITAL_COMMUNITY): Payer: Self-pay

## 2023-08-30 DIAGNOSIS — G43909 Migraine, unspecified, not intractable, without status migrainosus: Secondary | ICD-10-CM | POA: Diagnosis not present

## 2023-08-30 MED ORDER — SODIUM CHLORIDE 0.9 % IV BOLUS
1000.0000 mL | Freq: Once | INTRAVENOUS | Status: AC
  Administered 2023-08-30: 1000 mL via INTRAVENOUS

## 2023-08-30 MED ORDER — KETOROLAC TROMETHAMINE 30 MG/ML IJ SOLN
30.0000 mg | Freq: Once | INTRAMUSCULAR | Status: AC
  Administered 2023-08-30: 30 mg via INTRAVENOUS
  Filled 2023-08-30: qty 1

## 2023-08-30 MED ORDER — SODIUM CHLORIDE 0.9 % IV SOLN: 12.5000 mg | Freq: Four times a day (QID) | INTRAVENOUS | Status: DC | PRN

## 2023-08-30 MED ORDER — PROCHLORPERAZINE EDISYLATE 10 MG/2ML IJ SOLN
10.0000 mg | Freq: Once | INTRAMUSCULAR | Status: AC
  Administered 2023-08-30: 10 mg via INTRAVENOUS
  Filled 2023-08-30: qty 2

## 2023-08-30 MED ORDER — BUTALBITAL-APAP-CAFFEINE 50-325-40 MG PO TABS
1.0000 | ORAL_TABLET | Freq: Four times a day (QID) | ORAL | 0 refills | Status: DC | PRN
Start: 2023-08-30 — End: 2024-01-23
  Filled 2023-08-30: qty 20, 3d supply, fill #0

## 2023-08-30 MED ORDER — DIPHENHYDRAMINE HCL 50 MG/ML IJ SOLN
50.0000 mg | Freq: Once | INTRAMUSCULAR | Status: AC
  Administered 2023-08-30: 50 mg via INTRAVENOUS
  Filled 2023-08-30: qty 1

## 2023-08-30 NOTE — ED Notes (Signed)
 Room is completely darkened for patient's comfort.

## 2023-08-30 NOTE — ED Provider Notes (Signed)
 [facility] Provider Note    Event Date/Time   First MD Initiated Contact with Patient 08/30/23 [phone]     (approximate)  History   Chief Complaint: Migraine  HPI  Mechell Shawntee Mainwaring is a 58 y.o. female with a past medical history of fibromyalgia, gastric reflux, migraine headaches, presents to the emergency department for a continued migraine headache.  According to the patient since Sunday she has been experiencing a migraine headache along with visual aura which is typical of her migraines.  Patient denies any weakness or numbness denies any slurred speech.  Patient states 8 or 9/10 in severity headache.  Patient went to her doctor 2 days ago had a Toradol injection, states minimal improvement but the headache has not worsened once again.  She called her doctor and they recommended she come to the emergency department for a "migraine cocktail."  Physical Exam   Triage Vital Signs: ED Triage Vitals  Encounter Vitals Group     BP 08/30/23 0916 120/89     Systolic BP Percentile --      Diastolic BP Percentile --      Pulse Rate 08/30/23 0916 73     Resp 08/30/23 0916 20     Temp 08/30/23 0916 98.7 F (37.1 C)     Temp Source 08/30/23 0916 Oral     SpO2 08/30/23 0916 99 %     Weight 08/30/23 0916 170 lb (77.1 kg)     Height 08/30/23 0916 5\' 2"  (1.575 m)     Head Circumference --      Peak Flow --      Pain Score 08/30/23 0917 10     Pain Loc --      Pain Education --      Exclude from Growth Chart --     Most recent vital signs: Vitals:   08/30/23 0916  BP: 120/89  Pulse: 73  Resp: 20  Temp: 98.7 F (37.1 C)  SpO2: 99%    General: Awake, no distress.  CV:  Good peripheral perfusion.  Regular rate and rhythm  Resp:  Normal effort.  Equal breath sounds bilaterally.  Abd:  No distention.  ED Results / Procedures / Treatments   MEDICATIONS ORDERED IN ED: Medications  ketorolac (TORADOL) 30 MG/ML injection 30 mg (has no administration in time  range)  diphenhydrAMINE (BENADRYL) injection 50 mg (has no administration in time range)  sodium chloride 0.9 % bolus 1,000 mL (has no administration in time range)  prochlorperazine (COMPAZINE) injection 10 mg (has no administration in time range)     IMPRESSION / MDM / ASSESSMENT AND PLAN / ED COURSE  I reviewed the triage vital signs and the nursing notes.  Patient's presentation is most consistent with acute illness / injury with system symptoms.  Patient presents to the emergency department for multiple days of headache consistent with past migraine headaches.  Patient is prescribed 2 different prescription medications for headache she has tried these without success so she came to the emergency department.  Patient states she has required a "migraine cocktail" previously that helped with her prior migraine.  We will dose Toradol, Compazine, Benadryl, fluids and reassess.  No red flags on my exam or history.  Will reassess after medications.  Patient states she is feeling much better following the medications.  Will discharge with a prescription for Fioricet to take if the pain returns/worsens.  Provided my typical migraine return precautions.  Patient agreeable to plan.  FINAL  CLINICAL IMPRESSION(S) / ED DIAGNOSES   Migraine headache    Note:  This document was prepared using Dragon voice recognition software and may include unintentional dictation errors.   Minna Antis, MD 08/30/23 1202

## 2023-08-30 NOTE — ED Triage Notes (Signed)
 Patient states migraine since Sunday; saw Neuro yesterday and was given a Toradol shot; has not had any relief with rescue meds.

## 2023-08-30 NOTE — ED Notes (Signed)
 Patient is tolerating diffused light into the room. Patient appears more comfortable.

## 2023-08-31 ENCOUNTER — Other Ambulatory Visit: Payer: Self-pay

## 2023-08-31 MED ORDER — METHYLPREDNISOLONE 4 MG PO TBPK
ORAL_TABLET | ORAL | 0 refills | Status: DC
  Filled 2023-08-31: qty 21, 6d supply, fill #0

## 2023-09-02 ENCOUNTER — Emergency Department
Admission: EM | Admit: 2023-09-02 | Discharge: 2023-09-02 | Disposition: A | Discharge status: Home / Self Care | Attending: Emergency Medicine | Admitting: Emergency Medicine

## 2023-09-02 ENCOUNTER — Emergency Department

## 2023-09-02 ENCOUNTER — Encounter: Payer: Self-pay | Admitting: Emergency Medicine

## 2023-09-02 ENCOUNTER — Other Ambulatory Visit: Payer: Self-pay

## 2023-09-02 DIAGNOSIS — G43919 Migraine, unspecified, intractable, without status migrainosus: Secondary | ICD-10-CM | POA: Insufficient documentation

## 2023-09-02 DIAGNOSIS — G43909 Migraine, unspecified, not intractable, without status migrainosus: Secondary | ICD-10-CM | POA: Diagnosis not present

## 2023-09-02 DIAGNOSIS — R519 Headache, unspecified: Secondary | ICD-10-CM | POA: Diagnosis not present

## 2023-09-02 LAB — BASIC METABOLIC PANEL WITH GFR
Anion gap: 10 (ref 5–15)
BUN: 13 mg/dL (ref 6–20)
CO2: 26 mmol/L (ref 22–32)
Calcium: 9.6 mg/dL (ref 8.9–10.3)
Chloride: 101 mmol/L (ref 98–111)
Creatinine, Ser: 1.26 mg/dL — ABNORMAL HIGH (ref 0.44–1.00)
GFR, Estimated: 49 mL/min — ABNORMAL LOW (ref >60)
Glucose, Bld: 104 mg/dL — ABNORMAL HIGH (ref 70–99)
Potassium: 3.7 mmol/L (ref 3.5–5.1)
Sodium: 137 mmol/L (ref 135–145)

## 2023-09-02 LAB — CBC WITH DIFFERENTIAL/PLATELET
Abs Immature Granulocytes: 0.05 10*3/uL (ref 0.00–0.07)
Basophils Absolute: 0 10*3/uL (ref 0.0–0.1)
Basophils Relative: 0 %
Eosinophils Absolute: 0.1 10*3/uL (ref 0.0–0.5)
Eosinophils Relative: 1 %
HCT: 47.4 % — ABNORMAL HIGH (ref 36.0–46.0)
Hemoglobin: 16.4 g/dL — ABNORMAL HIGH (ref 12.0–15.0)
Immature Granulocytes: 0 %
Lymphocytes Relative: 22 %
Lymphs Abs: 2.6 10*3/uL (ref 0.7–4.0)
MCH: 31.5 pg (ref 26.0–34.0)
MCHC: 34.6 g/dL (ref 30.0–36.0)
MCV: 91.2 fL (ref 80.0–100.0)
Monocytes Absolute: 0.7 10*3/uL (ref 0.1–1.0)
Monocytes Relative: 6 %
Neutro Abs: 8 10*3/uL — ABNORMAL HIGH (ref 1.7–7.7)
Neutrophils Relative %: 71 %
Platelets: 377 10*3/uL (ref 150–400)
RBC: 5.2 MIL/uL — ABNORMAL HIGH (ref 3.87–5.11)
RDW: 12.3 % (ref 11.5–15.5)
WBC: 11.4 10*3/uL — ABNORMAL HIGH (ref 4.0–10.5)
nRBC: 0 % (ref 0.0–0.2)

## 2023-09-02 MED ORDER — DIPHENHYDRAMINE HCL 50 MG/ML IJ SOLN
50.0000 mg | Freq: Once | INTRAMUSCULAR | Status: AC
  Administered 2023-09-02: 50 mg via INTRAVENOUS
  Filled 2023-09-02: qty 1

## 2023-09-02 MED ORDER — SODIUM CHLORIDE 0.9 % IV BOLUS
1000.0000 mL | Freq: Once | INTRAVENOUS | Status: AC
  Administered 2023-09-02: 1000 mL via INTRAVENOUS

## 2023-09-02 MED ORDER — KETOROLAC TROMETHAMINE 10 MG PO TABS: 10.0000 mg | ORAL_TABLET | Freq: Four times a day (QID) | ORAL | 0 refills | Status: DC | PRN

## 2023-09-02 MED ORDER — EXCEDRIN MIGRAINE 250-250-65 MG PO TABS: 1.0000 | ORAL_TABLET | Freq: Four times a day (QID) | ORAL | 0 refills | Status: DC | PRN

## 2023-09-02 MED ORDER — PROMETHAZINE HCL 25 MG/ML IJ SOLN
25.0000 mg | Freq: Once | INTRAMUSCULAR | Status: AC
  Administered 2023-09-02: 25 mg via INTRAMUSCULAR
  Filled 2023-09-02: qty 1

## 2023-09-02 MED ORDER — KETOROLAC TROMETHAMINE 30 MG/ML IJ SOLN
30.0000 mg | Freq: Once | INTRAMUSCULAR | Status: AC
  Administered 2023-09-02: 30 mg via INTRAMUSCULAR
  Filled 2023-09-02: qty 1

## 2023-09-02 MED ORDER — SUMATRIPTAN SUCCINATE 6 MG/0.5ML ~~LOC~~ SOLN
6.0000 mg | Freq: Once | SUBCUTANEOUS | Status: AC
  Administered 2023-09-02: 6 mg via SUBCUTANEOUS
  Filled 2023-09-02: qty 0.5

## 2023-09-02 MED ORDER — DEXAMETHASONE SODIUM PHOSPHATE 10 MG/ML IJ SOLN
10.0000 mg | Freq: Once | INTRAMUSCULAR | Status: AC
  Administered 2023-09-02: 10 mg via INTRAVENOUS
  Filled 2023-09-02: qty 1

## 2023-09-02 MED ORDER — PROMETHAZINE HCL 25 MG PO TABS: 25.0000 mg | ORAL_TABLET | Freq: Four times a day (QID) | ORAL | 0 refills | Status: DC | PRN

## 2023-09-02 NOTE — ED Provider Notes (Signed)
 [facility] Provider Note  Patient Contact: 2:37 PM (approximate)   History   Migraine   HPI  Elizabeth Bowers is a 58 y.o. female who presents to the emergency department with a migraine headache.  Patient has a longstanding history of migraines, has a neurologist.  She takes [place] and Maxalt as needed for migraines.  She has been dealing with this migraine for 2 weeks.  She is to talk to neurology who has put her on steroids.  She has been to the emergency department for a migraine cocktail.  Her neurologist is given her a Toradol injection.  Patient states that these measures will temporarily help with the migraine comes back.  Other than the length there is no atypical features.  She denies any visual changes, unilateral weakness, slurred speech.  She has photo and phono sensitive.     Physical Exam   Triage Vital Signs: ED Triage Vitals  Encounter Vitals Group     BP 09/02/23 1139 (!) 145/91     Systolic BP Percentile --      Diastolic BP Percentile --      Pulse Rate 09/02/23 1139 100     Resp 09/02/23 1139 18     Temp 09/02/23 1139 98.3 F (36.8 C)     Temp Source 09/02/23 1139 Oral     SpO2 09/02/23 1139 96 %     Weight 09/02/23 1138 169 lb 12.1 oz (77 kg)     Height 09/02/23 1138 5\' 2"  (1.575 m)     Head Circumference --      Peak Flow --      Pain Score 09/02/23 1138 9     Pain Loc --      Pain Education --      Exclude from Growth Chart --     Most recent vital signs: Vitals:   09/02/23 1529 09/02/23 1529  BP: (!) 158/90   Pulse: 76   Resp: 20   Temp:  98 F (36.7 C)  SpO2: 100%      General: Alert and in no acute distress. Eyes:  PERRL. EOMI. Head: No acute traumatic findings  Neck: No stridor. No cervical spine tenderness to palpation.  Cardiovascular:  Good peripheral perfusion Respiratory: Normal respiratory effort without tachypnea or retractions. Lungs CTAB. Good air entry to the bases with no decreased or absent  breath sounds Musculoskeletal: Full range of motion to all extremities.  Neurologic:  No gross focal neurologic deficits are appreciated.  Cranial nerves II through XII grossly intact Skin:   No rash noted Other:   ED Results / Procedures / Treatments   Labs (all labs ordered are listed, but only abnormal results are displayed) Labs Reviewed  CBC WITH DIFFERENTIAL/PLATELET - Abnormal; Notable for the following components:      Result Value   WBC 11.4 (*)    RBC 5.20 (*)    Hemoglobin 16.4 (*)    HCT 47.4 (*)    Neutro Abs 8.0 (*)    All other components within normal limits  BASIC METABOLIC PANEL WITH GFR - Abnormal; Notable for the following components:   Glucose, Bld 104 (*)    Creatinine, Ser 1.26 (*)    GFR, Estimated 49 (*)    All other components within normal limits     EKG     RADIOLOGY  I personally viewed, evaluated, and interpreted these images as part of my medical decision making, as well as reviewing the written  report by the radiologist.  ED Provider Interpretation: No acute intracranial abnormality.  CT Head Wo Contrast Result Date: 09/02/2023 CLINICAL DATA:  Headache. EXAM: CT HEAD WITHOUT CONTRAST TECHNIQUE: Contiguous axial images were obtained from the base of the skull through the vertex without intravenous contrast. RADIATION DOSE REDUCTION: This exam was performed according to the departmental dose-optimization program which includes automated exposure control, adjustment of the mA and/or kV according to patient size and/or use of iterative reconstruction technique. COMPARISON:  Brain MRI 04/08/2013 FINDINGS: Brain: No evidence of acute infarction, hemorrhage, hydrocephalus, extra-axial collection or mass lesion/mass effect. Vascular: No hyperdense vessel or unexpected calcification. Skull: Normal. Negative for fracture or focal lesion. Sinuses/Orbits: Paranasal sinuses and mastoid air cells are clear. Other: None IMPRESSION: No acute intracranial  abnormalities. Electronically Signed   By: Signa Kell M.D.   On: 09/02/2023 13:47    PROCEDURES:  Critical Care performed: No  Procedures   MEDICATIONS ORDERED IN ED: Medications  sodium chloride 0.9 % bolus 1,000 mL (0 mLs Intravenous Stopped 09/02/23 1529)  ketorolac (TORADOL) 30 MG/ML injection 30 mg (30 mg Intramuscular Given 09/02/23 1508)  promethazine (PHENERGAN) injection 25 mg (25 mg Intramuscular Given 09/02/23 1531)  dexamethasone (DECADRON) injection 10 mg (10 mg Intravenous Given 09/02/23 1510)  diphenhydrAMINE (BENADRYL) injection 50 mg (50 mg Intravenous Given 09/02/23 1509)  SUMAtriptan (IMITREX) injection 6 mg (6 mg Subcutaneous Given 09/02/23 1525)     IMPRESSION / MDM / ASSESSMENT AND PLAN / ED COURSE  I reviewed the triage vital signs and the nursing notes.                                 Differential diagnosis includes, but is not limited to, migraine, tension type headache, cluster headache, intracranial mass, CVA  Patient's presentation is most consistent with acute presentation with potential threat to life or bodily function.   Patient's diagnosis is consistent with migraine headache.  Patient presents to the emergency department with intractable migraine.  Patient has been seen by her neurologist as well as this department and has had multiple chronic medications as well as new medications to alleviate this ongoing headache.  Given the fact that this is the third encounter with a healthcare provider regarding this migraine I did feel that imaging was necessary.  CT head reveals no changes when compared with previous MRI from last November.  Patient was neurologically intact at this time.  We have administered a migraine cocktail of Toradol, Phenergan, Benadryl, Imitrex, dexamethasone and fluids.  Patient feels much improved.  I will discharge the patient with Toradol tablets, Phenergan tablets, Excedrin that she can take in addition to her Fioricet, Maxalt,  Qulipta..  Concerning signs and symptoms to follow-up are discussed.  Patient has tolerated in the past paraspinal nerve blocks for her migraine.  I recommend she follow-up with physiatry for paraspinal blocks.  Patient is agreeable with this plan.  Patient is given ED precautions to return to the ED for any worsening or new symptoms.     FINAL CLINICAL IMPRESSION(S) / ED DIAGNOSES   Final diagnoses:  Intractable migraine without status migrainosus, unspecified migraine type     Rx / DC Orders   ED Discharge Orders          Ordered    ketorolac (TORADOL) 10 MG tablet  Every 6 hours PRN        09/02/23 1448    promethazine (PHENERGAN) 25  MG tablet  Every 6 hours PRN        09/02/23 1448    aspirin-acetaminophen-caffeine (EXCEDRIN MIGRAINE) 250-250-65 MG tablet  Every 6 hours PRN        09/02/23 1448             Note:  This document was prepared using Dragon voice recognition software and may include unintentional dictation errors.   Racheal Patches, PA-C 09/02/23 1544    Janith Atkin, MD 09/02/23 Mikle Bosworth

## 2023-09-02 NOTE — ED Triage Notes (Signed)
 Patient to ED via POV for migraine- ongoing x1 week. Seen neurologist and ED last week for same. Currently on steroids.

## 2023-09-02 NOTE — ED Notes (Signed)
 Pt to ED for intractable migraine since 1 week, states "I think there's something more wrong with me than a migraine because meds would have helped by now." Seen here recently for same. Currently on steroids. States unable to stay out of bed for longer than 2-3 hours without vomiting. Currently sitting in the dark. Endorses bilateral vision changes, states "I'm seeing colored orbs".

## 2023-09-05 ENCOUNTER — Encounter: Payer: Self-pay | Admitting: Family Medicine

## 2023-09-06 ENCOUNTER — Other Ambulatory Visit (HOSPITAL_COMMUNITY): Payer: Self-pay

## 2023-09-07 ENCOUNTER — Ambulatory Visit: Payer: Self-pay | Admitting: *Deleted

## 2023-09-07 ENCOUNTER — Other Ambulatory Visit: Payer: Self-pay

## 2023-09-07 ENCOUNTER — Ambulatory Visit: Admitting: Family Medicine

## 2023-09-07 NOTE — Telephone Encounter (Signed)
  Chief Complaint: migraine x 2 weeks, vomiting, can't work requesting work note Symptoms: migraine headache all over, "feels like vise grip squeezing ". Dizziness, vomiting at times and severe nausea. Poor po intake due to N/V. Can tolerate liquids and small amounts of food. Holds on to wall at times to walk, has been evaluated in ED. Has been out of work since Tuesday . Tried to return and sx returned. Reports "feels like head is disconnected " Frequency: last Tuesday  Pertinent Negatives: Patient denies chest pain no difficulty breathing no fever Disposition: [] ED /[] Urgent Care (no appt availability in office) / [x] Appointment(In office/virtual)/ []  [facility]/ [] Home Care/ [] Refused Recommended Disposition /[] [facility]/ []  Follow-up with PCP Additional Notes:   No appt available with PCP. Scheduled appt today with other provider in office. Please advise if patient can be seen earlier or with PCP.   Recommended if sx worsen go back to ED.     Copied from CRM [id number]. Topic: Clinical - Red Word Triage >> Sep 07, 2023 10:07 AM Higinio Roger wrote: Red Word that prompted transfer to Nurse Triage: Migraines for 2weeks. Can't work without vomiting. Nauseous and can't eat. Face tingling and seeing orbs Reason for Disposition  [1] Vomiting AND [2] 2 or more times  (Exception: Similar to previous migraines.)  Answer Assessment - Initial Assessment Questions 1. LOCATION: "Where does it hurt?"      All over head  2. ONSET: "When did the headache start?" (Minutes, hours or days)      2 weeks  3. PATTERN: "Does the pain come and go, or has it been constant since it started?"     Constant  4. SEVERITY: "How bad is the pain?" and "What does it keep you from doing?"  (e.g., Scale 1-10; mild, moderate, or severe)   - MILD (1-3): doesn't interfere with normal activities    - MODERATE (4-7): interferes with normal activities or awakens from sleep    - SEVERE (8-10): excruciating  pain, unable to do any normal activities        7-8 /10  5. RECURRENT SYMPTOM: "Have you ever had headaches before?" If Yes, ask: "When was the last time?" and "What happened that time?"      Yes  6. CAUSE: "What do you think is causing the headache?"     Not sure constant migraine 7. MIGRAINE: "Have you been diagnosed with migraine headaches?" If Yes, ask: "Is this headache similar?"      Yes  8. HEAD INJURY: "Has there been any recent injury to the head?"      na 9. OTHER SYMPTOMS: "Do you have any other symptoms?" (fever, stiff neck, eye pain, sore throat, cold symptoms)     Headache all over, dizziness,decrease balance vomiting at times, poor po intake  10. PREGNANCY: "Is there any chance you are pregnant?" "When was your last menstrual period?"       Na  Protocols used: Headache-A-AH

## 2023-09-07 NOTE — Telephone Encounter (Signed)
 Does anyone have acute visits available today? She needs to be seen.

## 2023-09-08 ENCOUNTER — Other Ambulatory Visit (HOSPITAL_COMMUNITY): Payer: Self-pay

## 2023-09-18 NOTE — Progress Notes (Unsigned)
 PROVIDER NOTE: Interpretation of information contained herein should be left to medically-trained personnel. Specific patient instructions are provided elsewhere under "Patient Instructions" section of medical record. This document was created in part using AI and STT-dictation technology, any transcriptional errors that may result from this process are unintentional.  Patient: Elizabeth Bowers  Service: E/M   PCP: Erasmo Downer, MD  DOB: Oct 31, 1965  DOS: 09/19/2023  Provider: Oswaldo Done, MD  MRN: [id number]  Delivery: Face-to-face  Specialty: Interventional Pain Management  Type: Established Patient  Setting: Ambulatory outpatient facility  Specialty designation: 09  Referring Prov.: Beryle Flock Marzella Schlein, MD  Location: Outpatient office facility       HPI  Elizabeth Bowers, a 58 y.o. year old female, is here today because of her No primary diagnosis found.. [name] primary complain today is No chief complaint on file.  Pertinent problems: Ms. Kenney has Chronic sacroiliac joint pain (Right); Fibromyalgia; Chronic knee pain (Right); DDD (degenerative disc disease), lumbar; Chronic low back pain (2ry area of Pain) (Bilateral) (L>R) w/ sciatica (Bilateral); Chronic lower extremity pain (3ry area of Pain) (Bilateral) (L>R); Chronic neck pain (1ry area of Pain) (Bilateral) (midline); Chronic foot pain (Left); Chronic pain syndrome; Ganglion cyst; Chronic upper extremity pain (4th area of Pain) (Bilateral) (R>L); Cervical Grade 1 (2 mm) Retrolisthesis C5 over C6; Cervical foraminal stenosis (C3-4 & C5-6) (Bilateral); DDD (degenerative disc disease), cervical; Spondylosis without myelopathy or radiculopathy, cervical region; Chronic hip pain (Bilateral) (L>R); Lumbar facet syndrome (Bilateral) (R>L); Spondylosis without myelopathy or radiculopathy, lumbar region; Chronic Sacroiliac joint dysfunction (Bilateral) (R>L); Chronic Somatic dysfunction of sacroiliac joint (Bilateral) (R>L);  Osteoarthritis of hip (Bilateral); Cervicogenic headache (Bilateral); Cervicalgia (Bilateral) (R>L); Cervical facet syndrome (Bilateral) (R>L); Chronic occipital neuralgia (Bilateral); Cervical spondylitis with radiculitis (HCC) (C6) (Bilateral) (R>L); Chronic sacroiliac joint pain (Bilateral) (R>L); Abnormal MRI, cervical spine (07/10/2018); Chronic low back pain (Bilateral) w/o sciatica; Anterior tibialis tendinitis, right; Radial styloid tenosynovitis of left hand; Pain in right hand; Bilateral carpal tunnel syndrome; Chronic pain of left wrist; Morton's neuroma of foot (Left); Chronic hip pain (Right); Impaired range of motion of hip (Right); Chronic lower extremity pain (Right); Lower extremity radicular pain (Right); Lumbar radiculitis (L3/L4) (Right); and Cervical radiculopathy at C6 (Right) on their pertinent problem list. Pain Assessment: Severity of   is reported as a  /10. Location:    / . Onset:  . Quality:  . Timing:  . Modifying factor(s):  Marland Kitchen Vitals:  vitals were not taken for this visit.  BMI: Estimated body mass index is 31.05 kg/m as calculated from the following:   Height as of 09/02/23: 5\' 2"  (1.575 m).   Weight as of 09/02/23: 169 lb 12.1 oz (77 kg). Last encounter: 02/01/2023. Last procedure: 01/11/2023.  Reason for encounter:  *** . ***  Discussed the use of AI scribe software for clinical note transcription with the patient, who gave verbal consent to proceed.  History of Present Illness           Pharmacotherapy Assessment  Analgesic: No chronic opioid analgesics therapy prescribed by our practice.   Monitoring: [place] PMP: PDMP reviewed during this encounter.       Pharmacotherapy: No side-effects or adverse reactions reported. Compliance: No problems identified. Effectiveness: Clinically acceptable.  No notes on file  No results found for: "CBDTHCR" No results found for: "D8THCCBX" No results found for: "D9THCCBX"  UDS:  Summary  Date Value Ref Range Status   01/16/2018 FINAL  Final  Comment:    ==================================================================== TOXAS[facility]RE COMP DRUG ANALYSIS,UR ==================================================================== Test                             Result       Flag       Units Drug Present and Declared for Prescription Verification   Oxazepam                       51           EXPECTED   ng/mg creat   Temazepam                      44           EXPECTED   ng/mg creat    Oxazepam and temazepam are expected metabolites of diazepam.    Oxazepam is also an expected metabolite of other benzodiazepine    drugs, including chlordiazepoxide, prazepam, clorazepate,    halazepam, and temazepam.  Oxazepam and temazepam are available    as scheduled prescription medications.   Phentermine                    PRESENT      EXPECTED   Duloxetine                     PRESENT      EXPECTED Drug Present not Declared for Prescription Verification   Topiramate                     PRESENT      UNEXPECTED   Acetaminophen                  PRESENT      UNEXPECTED Drug Absent but Declared for Prescription Verification   Cyclobenzaprine                Not Detected UNEXPECTED   Promethazine                   Not Detected UNEXPECTED ==================================================================== Test                      Result    Flag   Units      Ref Range   Creatinine              57               mg/dL      >=16 ==================================================================== Declared Medications:  The flagging and interpretation on this report are based on the  following declared medications.  Unexpected results may arise from  inaccuracies in the declared medications.  **Note: The testing scope of this panel includes these medications:  Cyclobenzaprine  Diazepam  Duloxetine  Phentermine  Promethazine  **Note: The testing scope of this panel does not include following  reported medications:   Acyclovir  Budesonide  Docusate  Hydrochlorothiazide (Triamterine-Hydrochlorthzide)  Levocetirizine  Linaclotide  Meclizine  Montelukast  Multivitamin  Potassium  Triamterene (Triamterine-Hydrochlorthzide)  Valacyclovir ==================================================================== For clinical consultation, please call [phone]. ====================================================================       ROS  Constitutional: Denies any fever or chills Gastrointestinal: No reported hemesis, hematochezia, vomiting, or acute GI distress Musculoskeletal: Denies any acute onset joint swelling, redness, loss of ROM, or weakness Neurological: No reported episodes of acute onset apraxia, aphasia, dysarthria, agnosia, amnesia, paralysis, loss of coordination, or  loss of consciousness  Medication Review  Atogepant, DULoxetine, Ubrogepant, amLODipine, aspirin-acetaminophen-caffeine, benzonatate, buPROPion, butalbital-acetaminophen-caffeine, hydrOXYzine, ipratropium, ketorolac, lansoprazole, linaclotide, methylPREDNISolone, multivitamin with minerals, ondansetron, oxybutynin, promethazine, promethazine-dextromethorphan, propranolol, rizatriptan, and triamterene-hydrochlorothiazide  History Review  Allergy: Ms. Mcmeans is allergic to gluten meal, lactose, ranitidine, sulfa antibiotics, amphetamine-dextroamphetamine, lactose intolerance (gi), reglan [metoclopramide], topiramate er, wheat, and soap. Drug: Ms. Lore  reports no history of drug use. Alcohol:  reports current alcohol use of about 2.0 - 4.0 standard drinks of alcohol per week. Tobacco:  reports that she quit smoking about 36 years ago. Her smoking use included cigarettes. She started smoking about 41 years ago. She has a 3.8 pack-year smoking history. She has never been exposed to tobacco smoke. She has never used smokeless tobacco. Social: Ms. Millspaugh  reports that she quit smoking about 36 years ago. Her smoking use included  cigarettes. She started smoking about 41 years ago. She has a 3.8 pack-year smoking history. She has never been exposed to tobacco smoke. She has never used smokeless tobacco. She reports current alcohol use of about 2.0 - 4.0 standard drinks of alcohol per week. She reports that she does not use drugs. Medical:  has a past medical history of Fatty liver, Fibromyalgia, GERD (gastroesophageal reflux disease), Hemorrhoids, Hot flashes, Hypertension, IBS (irritable bowel syndrome), LUQ abdominal pain (10/25/2016), Migraines, Miscarriage, Multilevel degenerative disc disease, Neuroma, and Vocal cord dysfunction. Surgical: Ms. Hommes  has a past surgical history that includes Elbow Debridement; Foot surgery; Cholecystectomy; Colonoscopy; Abdominal hysterectomy; Esophagogastroduodenoscopy (egd) with propofol (N/A, 02/06/2018); Colonoscopy with propofol (N/A, 12/22/2019); Colonoscopy with propofol (N/A, 12/23/2019); Image guided sinus surgery (N/A, 09/28/2021); Maxillary antrostomy (Bilateral, 09/28/2021); Ethmoidectomy (N/A, 09/28/2021); Sphenoidectomy (Bilateral, 09/28/2021); Colonoscopy with propofol (N/A, 01/01/2023); and polypectomy (01/01/2023). Family: family history includes Alcohol abuse in her father and son; Arthritis in her son and son; Asthma in her sister; COPD in her father, paternal grandmother, and sister; Cancer in her paternal grandfather and sister; Colon cancer in her sister; Colon polyps in her sister; Depression in her son; Diabetes in her maternal grandmother, mother, and son; Drug abuse in her son; Glaucoma in her mother; Hyperlipidemia in her mother; Hypertension in her mother; Kidney disease in her sister; Mental illness in her sister and son; Miscarriages / Stillbirths in her sister and sister; Uterine cancer in her sister.  Laboratory Chemistry Profile   Renal Lab Results  Component Value Date   BUN 13 09/02/2023   CREATININE 1.26 (H) 09/02/2023   BCR 13 11/02/2022   GFRAA >60 07/07/2019    GFRNONAA 49 (L) 09/02/2023    Hepatic Lab Results  Component Value Date   AST 39 11/02/2022   ALT 56 (H) 11/02/2022   ALBUMIN 4.4 11/02/2022   ALKPHOS 100 11/02/2022   LIPASE 33 12/15/2016    Electrolytes Lab Results  Component Value Date   NA 137 09/02/2023   K 3.7 09/02/2023   CL 101 09/02/2023   CALCIUM 9.6 09/02/2023   MG 2.2 01/16/2018    Bone Lab Results  Component Value Date   25OHVITD1 51 01/16/2018   25OHVITD2 <1.0 01/16/2018   25OHVITD3 50 01/16/2018    Inflammation (CRP: Acute Phase) (ESR: Chronic Phase) Lab Results  Component Value Date   CRP 0.8 07/05/2021   ESRSEDRATE 17 07/05/2021         Note: Above Lab results reviewed.  Recent Imaging Review  CT Head Wo Contrast CLINICAL DATA:  Headache.  EXAM: CT HEAD WITHOUT CONTRAST  TECHNIQUE: Contiguous axial images were obtained  from the base of the skull through the vertex without intravenous contrast.  RADIATION DOSE REDUCTION: This exam was performed according to the departmental dose-optimization program which includes automated exposure control, adjustment of the mA and/or kV according to patient size and/or use of iterative reconstruction technique.  COMPARISON:  Brain MRI 04/08/2013  FINDINGS: Brain: No evidence of acute infarction, hemorrhage, hydrocephalus, extra-axial collection or mass lesion/mass effect.  Vascular: No hyperdense vessel or unexpected calcification.  Skull: Normal. Negative for fracture or focal lesion.  Sinuses/Orbits: Paranasal sinuses and mastoid air cells are clear.  Other: None  IMPRESSION: No acute intracranial abnormalities.  Electronically Signed   By: Kimberley Penman M.D.   On: 09/02/2023 13:47 Note: Reviewed        Physical Exam  General appearance: Well nourished, well developed, and well hydrated. In no apparent acute distress Mental status: Alert, oriented x 3 (person, place, & time)       Respiratory: No evidence of acute respiratory  distress Eyes: PERLA Vitals: There were no vitals taken for this visit. BMI: Estimated body mass index is 31.05 kg/m as calculated from the following:   Height as of 09/02/23: 5\' 2"  (1.575 m).   Weight as of 09/02/23: 169 lb 12.1 oz (77 kg). Ideal: Patient weight not recorded  Assessment   Diagnosis Status  No diagnosis found. Controlled Controlled Controlled   Updated Problems: No problems updated.  Plan of Care  Problem-specific:  Assessment and Plan            Ms. Biviana Tailynn Armetta has a current medication list which includes the following long-term medication(s): amlodipine, bupropion, duloxetine, ipratropium, lansoprazole, linzess, promethazine, propranolol, rizatriptan, and triamterene-hydrochlorothiazide.  Pharmacotherapy (Medications Ordered): No orders of the defined types were placed in this encounter.  Orders:  No orders of the defined types were placed in this encounter.  Follow-up plan:   No follow-ups on file.     Interventional Therapies  Risk  Complexity Considerations:   WNL   Planned  Pending:      Under consideration:   Palliative/therapeutic right cervical ESI #4    Completed:   Diagnostic/therapeutic right L3-4 LESI x1 (09/08/2021) (100/100/100/100)  Palliative bilateral cervical facet MBB x2 (06/13/2018) (100/100/0/<25)  Diagnostic bilateral lumbar facet MBB x2 (08/08/2018) [facility])  Diagnostic right Cervical ESI x3 (01/11/2023) (100/50/100/100)  Therapeutic right cervical facet RFA x1 (07/23/2018) (100/50/0)    Completed by other providers:   Therapeutic sphenopalatine ganglion Blk by Dr. Bernetta Brilliant x2 (03/04/2018) Diagnostic bilateral upper extremity EMG/PNCV by Dr. Bernetta Brilliant: Right Grade II & Left Grade I CTS (08/23/2018)    Therapeutic  Palliative (PRN) options:   Therapeutic/palliative cervical facet MBB   Therapeutic/palliativelumbar facet MBB   Therapeutic/palliative Cervical ESI   Therapeutic/palliative cervical facet RFA       Recent  Visits No visits were found meeting these conditions. Showing recent visits within past 90 days and meeting all other requirements Future Appointments Date Type Provider Dept  09/19/23 Appointment Renaldo Caroli, MD [facility]  Showing future appointments within next 90 days and meeting all other requirements  I discussed the assessment and treatment plan with the patient. The patient was provided an opportunity to ask questions and all were answered. The patient agreed with the plan and demonstrated an understanding of the instructions.  Patient advised to call back or seek an in-person evaluation if the symptoms or condition worsens.  Duration of encounter: *** minutes.  Total time on encounter, as per AMA guidelines included both the  face-to-face and non-face-to-face time personally spent by the physician and/or other qualified health care professional(s) on the day of the encounter (includes time in activities that require the physician or other qualified health care professional and does not include time in activities normally performed by clinical staff). Physician's time may include the following activities when performed: Preparing to see the patient (e.g., pre-charting review of records, searching for previously ordered imaging, lab work, and nerve conduction tests) Review of prior analgesic pharmacotherapies. Reviewing PMP Interpreting ordered tests (e.g., lab work, imaging, nerve conduction tests) Performing post-procedure evaluations, including interpretation of diagnostic procedures Obtaining and/or reviewing separately obtained history Performing a medically appropriate examination and/or evaluation Counseling and educating the patient/family/caregiver Ordering medications, tests, or procedures Referring and communicating with other health care professionals (when not separately reported) Documenting clinical information in the electronic or other health  record Independently interpreting results (not separately reported) and communicating results to the patient/ family/caregiver Care coordination (not separately reported)  Note by: Candi Chafe, MD (TTS and AI technology used. I apologize for any typographical errors that were not detected and corrected.) Date: 09/19/2023; Time: 9:06 AM

## 2023-09-19 ENCOUNTER — Encounter: Payer: Self-pay | Admitting: Pain Medicine

## 2023-09-19 ENCOUNTER — Ambulatory Visit: Attending: Pain Medicine | Admitting: Pain Medicine

## 2023-09-19 ENCOUNTER — Other Ambulatory Visit: Payer: Self-pay

## 2023-09-19 VITALS — BP 124/94 | HR 110 | Temp 97.7°F | Resp 16 | Ht 62.0 in | Wt 179.0 lb

## 2023-09-19 DIAGNOSIS — M5030 Other cervical disc degeneration, unspecified cervical region: Secondary | ICD-10-CM | POA: Diagnosis not present

## 2023-09-19 DIAGNOSIS — G4486 Cervicogenic headache: Secondary | ICD-10-CM | POA: Diagnosis not present

## 2023-09-19 DIAGNOSIS — M542 Cervicalgia: Secondary | ICD-10-CM | POA: Diagnosis not present

## 2023-09-19 DIAGNOSIS — M4312 Spondylolisthesis, cervical region: Secondary | ICD-10-CM | POA: Diagnosis not present

## 2023-09-19 DIAGNOSIS — M47812 Spondylosis without myelopathy or radiculopathy, cervical region: Secondary | ICD-10-CM

## 2023-09-19 DIAGNOSIS — G8929 Other chronic pain: Secondary | ICD-10-CM | POA: Insufficient documentation

## 2023-09-19 DIAGNOSIS — M5412 Radiculopathy, cervical region: Secondary | ICD-10-CM | POA: Insufficient documentation

## 2023-09-19 DIAGNOSIS — M4310 Spondylolisthesis, site unspecified: Secondary | ICD-10-CM | POA: Diagnosis not present

## 2023-09-19 DIAGNOSIS — R112 Nausea with vomiting, unspecified: Secondary | ICD-10-CM | POA: Diagnosis not present

## 2023-09-19 MED ORDER — ONDANSETRON 4 MG PO TBDP
4.0000 mg | ORAL_TABLET | Freq: Three times a day (TID) | ORAL | 0 refills | Status: DC | PRN
  Filled 2023-09-19: qty 20, 7d supply, fill #0

## 2023-09-19 NOTE — Progress Notes (Signed)
 Excuse to be out of work given to patient and signed and approved by Dr. Marica Shoals. Work note stated for patient to be out of work starting today and return to work 24 hours after her procedure. Procedure scheduled for 09/26/23 with Dr. Rhesa Celeste and patient to return to work on Friday 09/28/23.   Copy made of work excuse letter and filed for scanning.

## 2023-09-19 NOTE — Patient Instructions (Signed)

## 2023-09-26 ENCOUNTER — Ambulatory Visit
Admission: RE | Admit: 2023-09-26 | Discharge: 2023-09-26 | Disposition: A | Discharge status: Home / Self Care | Source: Ambulatory Visit | Attending: Student in an Organized Health Care Education/Training Program | Admitting: Student in an Organized Health Care Education/Training Program

## 2023-09-26 ENCOUNTER — Encounter: Payer: Self-pay | Admitting: Student in an Organized Health Care Education/Training Program

## 2023-09-26 ENCOUNTER — Ambulatory Visit
Attending: Student in an Organized Health Care Education/Training Program | Admitting: Student in an Organized Health Care Education/Training Program

## 2023-09-26 VITALS — BP 133/87 | HR 113 | Temp 97.6°F | Resp 16 | Ht 62.0 in | Wt 170.0 lb

## 2023-09-26 DIAGNOSIS — M47812 Spondylosis without myelopathy or radiculopathy, cervical region: Secondary | ICD-10-CM | POA: Diagnosis not present

## 2023-09-26 DIAGNOSIS — G4486 Cervicogenic headache: Secondary | ICD-10-CM | POA: Insufficient documentation

## 2023-09-26 MED ORDER — ROPIVACAINE HCL 2 MG/ML IJ SOLN
18.0000 mL | Freq: Once | INTRAMUSCULAR | Status: AC
  Administered 2023-09-26: 18 mL via PERINEURAL
  Filled 2023-09-26: qty 20

## 2023-09-26 MED ORDER — LIDOCAINE HCL 2 % IJ SOLN
20.0000 mL | Freq: Once | INTRAMUSCULAR | Status: AC
  Administered 2023-09-26: 100 mg
  Filled 2023-09-26: qty 40

## 2023-09-26 MED ORDER — MIDAZOLAM HCL 2 MG/2ML IJ SOLN
0.5000 mg | Freq: Once | INTRAMUSCULAR | Status: AC
  Administered 2023-09-26: 2 mg via INTRAVENOUS
  Filled 2023-09-26: qty 2

## 2023-09-26 MED ORDER — DEXAMETHASONE SODIUM PHOSPHATE 10 MG/ML IJ SOLN
20.0000 mg | Freq: Once | INTRAMUSCULAR | Status: AC
  Administered 2023-09-26: 20 mg
  Filled 2023-09-26: qty 2

## 2023-09-26 MED ORDER — LACTATED RINGERS IV SOLN: Freq: Once | INTRAVENOUS | Status: AC

## 2023-09-26 MED ORDER — DEXAMETHASONE SODIUM PHOSPHATE 10 MG/ML IJ SOLN
INTRAMUSCULAR | Status: AC
  Filled 2023-09-26: qty 1

## 2023-09-26 NOTE — Patient Instructions (Signed)

## 2023-09-26 NOTE — Progress Notes (Signed)
 Safety precautions to be maintained throughout the outpatient stay will include: orient to surroundings, keep bed in low position, maintain call bell within reach at all times, provide assistance with transfer out of bed and ambulation.

## 2023-09-26 NOTE — Progress Notes (Signed)
 PROVIDER NOTE: Interpretation of information contained herein should be left to medically-trained personnel. Specific patient instructions are provided elsewhere under "Patient Instructions" section of medical record. This document was created in part using STT-dictation technology, any transcriptional errors that may result from this process are unintentional.  Patient: Elizabeth Bowers Type: Established DOB: 08/16/1965 MRN: [id number] PCP: Mazie Speed, MD  Service: Procedure DOS: 09/26/2023 Setting: Ambulatory Location: Ambulatory outpatient facility Delivery: Face-to-face Provider: Cephus Collin, MD Specialty: Interventional Pain Management Specialty designation: 09 Location: Outpatient facility Ref. Prov.: Bacigalupo, Stan Eans, MD       Interventional Therapy   ProcedureCervical Facet Medial Branch Block(s)  Laterality: Bilateral  Level: TON, C3, and C4 Medial Branch Level(s). Injecting these levels blocks the C2-3 and C3-4 cervical facet joints.  Imaging: Fluoroscopic guidance Anesthesia: Local anesthesia (1-2% Lidocaine ) Anxiolysis: IV Versed  2 mg  Sedation: Minimal Sedation  DOS: 09/26/2023  Performed by: Cephus Collin, MD  Purpose: Diagnostic/Therapeutic Indications: Cervicalgia (cervical spine axial pain) severe enough to impact quality of life or function. 1. Cervical facet syndrome (Bilateral) (R>L)   2. Spondylosis without myelopathy or radiculopathy, cervical region     I spoke with the patient before her procedure and introduced myself as it was my first time meeting her.  She states that she has been dealing with severe migraines and headaches that are limiting her ability to enjoy the weather and be outside.  Of note, this is a patient of my colleague, Dr. Barth Borne who is out of clinic this week and requested that I perform procedure for Elizabeth Bowers given her severe cervical pain and associated cervicogenic headaches that was negatively impacting her quality of life and  functional status.  I explained to the patient clearly that we would be doing a third occipital nerve block, C3 and C4 cervical facet medial branch nerve block bilaterally.  I clearly informed her that we will be using IV Versed  only.  She has a history of nausea in relation to her migraines and cervicogenic headaches so it was best to avoid fentanyl  IV for sedation which can exacerbate nausea.  Patient received the maximum dose of IV Versed  (2 mg)  for minimal sedation protocol. We opted to use 25-gauge spinal needles as opposed to 22-gauge spinal needles to make her procedure more tolerable and comfortable.  NAS-11 Pain score:   Pre-procedure: 2 /10   Post-procedure: 8 /10     Position / Prep / Materials:  Position: Prone. Head in cradle. C-spine slightly flexed. Prep solution: ChloraPrep (2% chlorhexidine gluconate and 70% isopropyl alcohol) Prep Area: Posterior Cervico-thoracic Region. From occipital ridge to tip of scapula, and from shoulder to shoulder. Entire posterior and lateral neck surface. Materials:  Tray: Block Needle(s):  Type: Spinal  Gauge (G): 25  Length: 3.5-in  Qty:  2     H&P (Pre-op Assessment):  Elizabeth Bowers is a 58 y.o. (year old), female patient, seen today for interventional treatment. She  has a past surgical history that includes Elbow Debridement; Foot surgery; Cholecystectomy; Colonoscopy; Abdominal hysterectomy; Esophagogastroduodenoscopy (egd) with propofol  (N/A, 02/06/2018); Colonoscopy with propofol  (N/A, 12/22/2019); Colonoscopy with propofol  (N/A, 12/23/2019); Image guided sinus surgery (N/A, 09/28/2021); Maxillary antrostomy (Bilateral, 09/28/2021); Ethmoidectomy (N/A, 09/28/2021); Sphenoidectomy (Bilateral, 09/28/2021); Colonoscopy with propofol  (N/A, 01/01/2023); and polypectomy (01/01/2023). Elizabeth Bowers has a current medication list which includes the following prescription(s): amlodipine , excedrin  migraine, benzonatate , butalbital -acetaminophen -caffeine , hydroxyzine ,  ipratropium, ketorolac , lansoprazole , linzess , multivitamin with minerals, ondansetron , promethazine , promethazine -dextromethorphan, rizatriptan , triamterene -hydrochlorothiazide , qulipta , bupropion , duloxetine , methylprednisolone , oxybutynin , propranolol , and ubrelvy .  Her primarily concern today is the Headache (Migraine full coverage. )  Initial Vital Signs:  Pulse/HCG Rate: (!) 113ECG Heart Rate: 96 Temp: 97.6 F (36.4 C) Resp: 16 BP: (!) 131/91 SpO2: 99 %  BMI: Estimated body mass index is 31.09 kg/m as calculated from the following:   Height as of this encounter: 5\' 2"  (1.575 m).   Weight as of this encounter: 170 lb (77.1 kg).  Risk Assessment: Allergies: Reviewed. She is allergic to gluten meal, lactose, ranitidine, sulfa antibiotics, amphetamine-dextroamphetamine, lactose intolerance (gi), reglan [metoclopramide], topiramate  er, wheat, and soap.  Allergy  Precautions: None required Coagulopathies: Reviewed. None identified.  Blood-thinner therapy: None at this time Active Infection(s): Reviewed. None identified. Elizabeth Bowers is afebrile  Site Confirmation: Elizabeth Bowers was asked to confirm the procedure and laterality before marking the site Procedure checklist: Completed Consent: Before the procedure and under the influence of no sedative(s), amnesic(s), or anxiolytics, the patient was informed of the treatment options, risks and possible complications. To fulfill our ethical and legal obligations, as recommended by the American Medical Association's Code of Ethics, I have informed the patient of my clinical impression; the nature and purpose of the treatment or procedure; the risks, benefits, and possible complications of the intervention; the alternatives, including doing nothing; the risk(s) and benefit(s) of the alternative treatment(s) or procedure(s); and the risk(s) and benefit(s) of doing nothing. The patient was provided information about the general risks and possible complications  associated with the procedure. These may include, but are not limited to: failure to achieve desired goals, infection, bleeding, organ or nerve damage, allergic reactions, paralysis, and death. In addition, the patient was informed of those risks and complications associated to Spine-related procedures, such as failure to decrease pain; infection (i.e.: Meningitis, epidural or intraspinal abscess); bleeding (i.e.: epidural hematoma, subarachnoid hemorrhage, or any other type of intraspinal or peri-dural bleeding); organ or nerve damage (i.e.: Any type of peripheral nerve, nerve root, or spinal cord injury) with subsequent damage to sensory, motor, and/or autonomic systems, resulting in permanent pain, numbness, and/or weakness of one or several areas of the body; allergic reactions; (i.e.: anaphylactic reaction); and/or death. Furthermore, the patient was informed of those risks and complications associated with the medications. These include, but are not limited to: allergic reactions (i.e.: anaphylactic or anaphylactoid reaction(s)); adrenal axis suppression; blood sugar elevation that in diabetics may result in ketoacidosis or comma; water retention that in patients with history of congestive heart failure may result in shortness of breath, pulmonary edema, and decompensation with resultant heart failure; weight gain; swelling or edema; medication-induced neural toxicity; particulate matter embolism and blood vessel occlusion with resultant organ, and/or nervous system infarction; and/or aseptic necrosis of one or more joints. Finally, the patient was informed that Medicine is not an exact science; therefore, there is also the possibility of unforeseen or unpredictable risks and/or possible complications that may result in a catastrophic outcome. The patient indicated having understood very clearly. We have given the patient no guarantees and we have made no promises. Enough time was given to the patient to  ask questions, all of which were answered to the patient's satisfaction. Ms. Wix has indicated that she wanted to continue with the procedure. Attestation: I, the ordering provider, attest that I have discussed with the patient the benefits, risks, side-effects, alternatives, likelihood of achieving goals, and potential problems during recovery for the procedure that I have provided informed consent. Date  Time: 09/26/2023  8:12 AM  Pre-Procedure Preparation:  Monitoring: As per  clinic protocol. Respiration, ETCO2, SpO2, BP, heart rate and rhythm monitor placed and checked for adequate function Safety Precautions: Patient was assessed for positional comfort and pressure points before starting the procedure. Time-out: I initiated and conducted the "Time-out" before starting the procedure, as per protocol. The patient was asked to participate by confirming the accuracy of the "Time Out" information. Verification of the correct person, site, and procedure were performed and confirmed by me, the nursing staff, and the patient. "Time-out" conducted as per Joint Commission's Universal Protocol (UP.01.01.01). Time: 0907 Start Time: 0907 hrs.  Description/Narrative of Procedure:          Laterality: See above: bilateral Targeted Levels: See above.  Rationale (medical necessity): procedure needed and proper for the diagnosis and/or treatment of the patient's medical symptoms and needs. Procedural Technique Safety Precautions: Aspiration looking for blood return was conducted prior to all injections. At no point did we inject any substances, as a needle was being advanced. No attempts were made at seeking any paresthesias. Safe injection practices and needle disposal techniques used. Medications properly checked for expiration dates. SDV (single dose vial) medications used. Description of the Procedure: Protocol guidelines were followed. The patient was assisted into a comfortable position. The target area  was identified and the area prepped in the usual manner. Skin & deeper tissues infiltrated with local anesthetic. Appropriate amount of time allowed to pass for local anesthetics to take effect. The procedure needles were then advanced to the target area. Proper needle placement secured. Negative aspiration confirmed. Solution injected in intermittent fashion, asking for systemic symptoms every 0.5cc of injectate. The needles were then removed and the area cleansed, making sure to leave some of the prepping solution back to take advantage of its long term bactericidal properties.  Technical description of process:  C2 Medial Branch Nerve Block (MBB): The target area for the C2 dorsal medial articular branch is the lateral concave waist of the articular pillar of C2. Under fluoroscopic guidance, a Quincke needle was inserted until contact was made with os over the postero-lateral aspect of the articular pillar of C2 (target area). After negative aspiration for blood, 1mL of the nerve block solution was injected without difficulty or complication. The needle was removed intact. Third Occipital Nerve (TON) Block (MBB): The target area for the TON branch is the postero-lateral aspect of the C2-C3 articulation. Under fluoroscopic guidance, a Quincke needle was inserted until contact was made with os over the target area. After negative aspiration for blood,1 mL of the nerve block solution was injected without difficulty or complication. The needle was removed intact. C3 Medial Branch Nerve Block (MBB): The target area for the C3 dorsal medial articular branch is the lateral concave waist of the articular pillar of C3. Under fluoroscopic guidance, a Quincke needle was inserted until contact was made with os over the postero-lateral aspect of the articular pillar of C3 (target area). After negative aspiration for blood,65mL of the nerve block solution was injected without difficulty or complication. The needle was  removed intact.  This was done bilaterally for both sides  Once the entire procedure was completed, the treated area was cleaned, making sure to leave some of the prepping solution back to take advantage of its long term bactericidal properties.  Anatomy Reference Guide:      Facet Joint Innervation  C1-2 Third occipital Nerve (TON)  C2-3 TON, C3  Medial Branch  C3-4 C3, C4         "          "  C4-5 C4, C5         "          "  C5-6 C5, C6         "          "  C6-7 C6, C7         "          "  C7-T1 C7, C8         "          "   Cervical Facet Pain Pattern overlap:   Vitals:   09/26/23 0907 09/26/23 0912 09/26/23 0917 09/26/23 0926  BP: 110/89 117/82 (!) 133/98 133/87  Pulse:      Resp: 18 15 17 16   Temp:      TempSrc:      SpO2: 97% 100% 99% 100%  Weight:      Height:         Start Time: 0907 hrs. End Time:   hrs.  Imaging Guidance (Spinal):          Type of Imaging Technique: Fluoroscopy Guidance (Spinal) Indication(s): Fluoroscopy guidance for needle placement to enhance accuracy in procedures requiring precise needle localization for targeted delivery of medication in or near specific anatomical locations not easily accessible without such real-time imaging assistance. Exposure Time: Please see nurses notes. Contrast: None used. Fluoroscopic Guidance: I was personally present during the use of fluoroscopy. "Tunnel Vision Technique" used to obtain the best possible view of the target area. Parallax error corrected before commencing the procedure. "Direction-depth-direction" technique used to introduce the needle under continuous pulsed fluoroscopy. Once target was reached, antero-posterior, oblique, and lateral fluoroscopic projection used confirm needle placement in all planes. Images permanently stored in EMR. Interpretation: No contrast injected. I personally interpreted the imaging intraoperatively. Adequate needle placement confirmed in multiple planes. Permanent  images saved into the patient's record.  Post-operative Assessment:  Post-procedure Vital Signs:  Pulse/HCG Rate: (!) 11389 Temp: 97.6 F (36.4 C) Resp: 16 BP: 133/87 SpO2: 100 %  EBL: None  Complications: No immediate post-treatment complications observed by team, or reported by patient.  Note:      A repeat set of vitals were taken after the procedure and the patient was kept under observation following institutional policy, for this type of procedure. Post-procedural neurological assessment was performed, showing return to baseline, prior to discharge. The patient was provided with post-procedure discharge instructions, including a section on how to identify potential problems. Should any problems arise concerning this procedure, the patient was given instructions to immediately contact us , at any time, without hesitation. In any case, we plan to contact the patient by telephone for a follow-up status report regarding this interventional procedure.  Following the procedure, the patient exhibited behavior that was highly inappropriate and unprofessional in the post-procedure recovery room. She was verbally aggressive, used profane language, and was disrespectful toward nursing and support staff who were providing routine post-procedural care. Multiple attempts were made to assist her and ensure her comfort and safety (including offering her an ice pack), but she continued to be verbally hostile despite these efforts. Per report from nursing tech, she used  profane language against our front desk receptionist and also snatched after visit summary papers from nursing tech who was helping to push her wheelchair downstairs to her car. See nursing notes as well.  Comments:  No additional relevant information.  Plan of Care (POC)  Orders:  Orders Placed This Encounter  Procedures  DG PAIN CLINIC C-ARM 1-60 MIN NO REPORT    Intraoperative interpretation by procedural physician at [facility].    Standing Status:   Standing    Number of Occurrences:   1    Reason for exam::   Assistance in needle guidance and placement for procedures requiring needle placement in or near specific anatomical locations not easily accessible without such assistance.     Medications ordered for procedure: Meds ordered this encounter  Medications   lidocaine  (XYLOCAINE ) 2 % (with pres) injection 400 mg   lactated ringers  infusion   midazolam  (VERSED ) injection 0.5-2 mg    Make sure Flumazenil is available in the pyxis when using this medication. If oversedation occurs, administer 0.2 mg IV over 15 sec. If after 45 sec no response, administer 0.2 mg again over 1 min; may repeat at 1 min intervals; not to exceed 4 doses (1 mg)   ropivacaine  (PF) 2 mg/mL (0.2%) (NAROPIN ) injection 18 mL   dexamethasone  (DECADRON ) injection 20 mg   Medications administered: We administered lidocaine , lactated ringers , midazolam , ropivacaine  (PF) 2 mg/mL (0.2%), and dexamethasone .  See the medical record for exact dosing, route, and time of administration.  Follow-up plan:   Return in about 4 weeks (around 10/24/2023) for PPE, VV- PLEASE SCHEDULE WITH DR. Barth Borne.      Recent Visits Date Type Provider Dept  09/19/23 Office Visit Renaldo Caroli, MD [facility]  Showing recent visits within past 90 days and meeting all other requirements Today's Visits Date Type Provider Dept  09/26/23 Procedure visit Cephus Collin, MD [facility]  Showing today's visits and meeting all other requirements Future Appointments Date Type Provider Dept  10/25/23 Appointment Renaldo Caroli, MD [facility]  Showing future appointments within next 90 days and meeting all other requirements  Disposition: Discharge home  Discharge (Date  Time): 09/26/2023; 0931 hrs.   Primary Care Physician: Mazie Speed, MD Location: [facility] Note by: Cephus Collin, MD (TTS technology used. I apologize for any typographical errors that were not detected and corrected.) Date: 09/26/2023; Time: 2:50 PM  Disclaimer:  Medicine is not an Visual merchandiser. The only guarantee in medicine is that nothing is guaranteed. It is important to note that the decision to proceed with this intervention was based on the information collected from the patient. The Data and conclusions were drawn from the patient's questionnaire, the interview, and the physical examination. Because the information was provided in large part by the patient, it cannot be guaranteed that it has not been purposely or unconsciously manipulated. Every effort has been made to obtain as much relevant data as possible for this evaluation. It is important to note that the conclusions that lead to this procedure are derived in large part from the available data. Always take into account that the treatment will also be dependent on availability of resources and existing treatment guidelines, considered by other Pain Management Practitioners as being common knowledge and practice, at the time of the intervention. For Medico-Legal purposes, it is also important to point out that variation in procedural techniques and pharmacological choices are the acceptable norm. The indications, contraindications, technique, and results of the above procedure should only be interpreted and judged by a Board-Certified Interventional Pain Specialist with extensive familiarity and expertise in the same exact procedure and technique.

## 2023-09-26 NOTE — Progress Notes (Signed)
 Patient very upset about not being taken to ECt for procedure. RN and CNA gave care to patient after sitting her up. Neck was washed and Duraprep removed .Patient states procedure was very painful. Ms. Korber was able to move head side to side, swallows well, ice pack given, sat on patients lap.Patient very verbal and  using profanity when she was talking .Asked if she would like to talk with provider or manager, she verbally said 'No" . MD dicussed that when procedure is performed in the clinic that we could only able to give Versed  2 mg IV. Patient very upset "That's why I should have F---ing gone to the OR '. Md stated that he spoke with patient and informed her that he would only be giving Versed . Patient was rude to staff when asking questions in recovery. Patient also upset when calling for ride. Secretary mistakely called patients number instead of husband. Informed patient it was an error.

## 2023-09-27 ENCOUNTER — Encounter: Payer: Self-pay | Admitting: Family Medicine

## 2023-09-27 ENCOUNTER — Telehealth: Payer: Self-pay

## 2023-09-27 ENCOUNTER — Telehealth: Payer: Self-pay | Admitting: Family Medicine

## 2023-09-27 NOTE — Telephone Encounter (Signed)
 Noted.

## 2023-09-27 NOTE — Telephone Encounter (Signed)
 Copied from CRM [id number]. Topic: Clinical - Red Word Triage >> Sep 27, 2023 10:35 AM Cottie Diss B wrote: Red Word that prompted transfer to Nurse Triage: *Patient refused triage* Severe migraines with vomiting and lightheadedness. Began end of March. States that nothing has helped and that she has been in ER and Urgent Care and that no one has done anything. Patient is very emotional, crying and frustrated. Wants to schedule an appt with a new PCP at [facility] as she stated Dr. David Escort has too many patients for her to get in. Call dropped and after calling patient back, she stated her PCP office called and spoke about her symptoms and they scheduled her for Monday.

## 2023-09-27 NOTE — Telephone Encounter (Signed)
 Work note given for 09-28-23 and 10-01-23 by Dr Rhesa Celeste.   Patient was given appt with Dr Naveira for Monday 10-01-23 to discuss procedure and to assess for further necessity or work notes.

## 2023-09-27 NOTE — Telephone Encounter (Signed)
 Post procedure follow up.  Patient states she has been up vomiting since she got up at 4am. Continues to have a severe headache and nausea.   States she has to go back to work tomorrow and will be unable to work and would like an extention to her work note.  Will discuss with Dr Rhesa Celeste.

## 2023-09-30 ENCOUNTER — Other Ambulatory Visit: Payer: Self-pay | Admitting: Family Medicine

## 2023-09-30 DIAGNOSIS — K581 Irritable bowel syndrome with constipation: Secondary | ICD-10-CM

## 2023-09-30 NOTE — Progress Notes (Unsigned)
 PROVIDER NOTE: Interpretation of information contained herein should be left to medically-trained personnel. Specific patient instructions are provided elsewhere under "Patient Instructions" section of medical record. This document was created in part using AI and STT-dictation technology, any transcriptional errors that may result from this process are unintentional.  Patient: Elizabeth Bowers  Service: E/M   PCP: Mazie Speed, MD  DOB: 12/22/1965  DOS: 10/01/2023  Provider: Candi Chafe, MD  MRN: [id number]  Delivery: Face-to-face  Specialty: Interventional Pain Management  Type: Established Patient  Setting: Ambulatory outpatient facility  Specialty designation: 09  Referring Prov.: David Escort Stan Eans, MD  Location: Outpatient office facility       HPI  [name], a 58 y.o. year old female, is here today because of her No primary diagnosis found.. [name] primary complain today is No chief complaint on file.  Pertinent problems: [name] has Chronic sacroiliac joint pain (Right); Fibromyalgia; Chronic knee pain (Right); DDD (degenerative disc disease), lumbar; Chronic low back pain (2ry area of Pain) (Bilateral) (L>R) w/ sciatica (Bilateral); Chronic lower extremity pain (3ry area of Pain) (Bilateral) (L>R); Chronic neck pain (1ry area of Pain) (Bilateral) (midline); Chronic foot pain (Left); Chronic pain syndrome; Ganglion cyst; Chronic upper extremity pain (4th area of Pain) (Bilateral) (R>L); Cervical Grade 1 (2 mm) Retrolisthesis C5 over C6; Cervical foraminal stenosis (C3-4 & C5-6) (Bilateral); DDD (degenerative disc disease), cervical; Spondylosis without myelopathy or radiculopathy, cervical region; Chronic hip pain (Bilateral) (L>R); Lumbar facet syndrome (Bilateral) (R>L); Spondylosis without myelopathy or radiculopathy, lumbar region; Chronic Sacroiliac joint dysfunction (Bilateral) (R>L); Chronic Somatic dysfunction of sacroiliac joint (Bilateral) (R>L);  Osteoarthritis of hip (Bilateral); Cervicogenic headache; Cervicalgia (Bilateral) (R>L); Cervical facet syndrome (Bilateral) (R>L); Chronic occipital neuralgia (Bilateral); Cervical spondylitis with radiculitis (HCC) (C6) (Bilateral) (R>L); Chronic sacroiliac joint pain (Bilateral) (R>L); Abnormal MRI, cervical spine (07/10/2018); Chronic low back pain (Bilateral) w/o sciatica; Anterior tibialis tendinitis, right; Radial styloid tenosynovitis of left hand; Pain in right hand; Bilateral carpal tunnel syndrome; Chronic pain of left wrist; Morton's neuroma of foot (Left); Chronic hip pain (Right); Impaired range of motion of hip (Right); Chronic lower extremity pain (Right); Lower extremity radicular pain (Right); Lumbar radiculitis (L3/L4) (Right); Cervical radiculopathy at C6 (Right); Pain in right shoulder; Chronic shoulder pain (Right); and Radicular pain of shoulder (Right) on their pertinent problem list. Pain Assessment: Severity of   is reported as a  /10. Location:    / . Onset:  . Quality:  . Timing:  . Modifying factor(s):  Aaron Aas Vitals:  vitals were not taken for this visit.  BMI: Estimated body mass index is 31.09 kg/m as calculated from the following:   Height as of 09/26/23: 5\' 2"  (1.575 m).   Weight as of 09/26/23: 170 lb (77.1 kg). Last encounter: 09/19/2023. Last procedure: 01/11/2023.  Reason for encounter:  *** . ***  Discussed the use of AI scribe software for clinical note transcription with the patient, who gave verbal consent to proceed.  History of Present Illness           Pharmacotherapy Assessment  Analgesic: No chronic opioid analgesics therapy prescribed by our practice.   Monitoring: [place] PMP: PDMP reviewed during this encounter.       Pharmacotherapy: No side-effects or adverse reactions reported. Compliance: No problems identified. Effectiveness: Clinically acceptable.  No notes on file  No results found for: "CBDTHCR" No results found for: "D8THCCBX" No results found  for: "D9THCCBX"  UDS:  Summary  Date Value Ref  Range Status  01/16/2018 FINAL  Final    Comment:    ==================================================================== TOXASSURE COMP DRUG ANALYSIS,UR ==================================================================== Test                             Result       Flag       Units Drug Present and Declared for Prescription Verification   Oxazepam                       51           EXPECTED   ng/mg creat   Temazepam                      44           EXPECTED   ng/mg creat    Oxazepam and temazepam are expected metabolites of diazepam .    Oxazepam is also an expected metabolite of other benzodiazepine    drugs, including chlordiazepoxide, prazepam, clorazepate,    halazepam, and temazepam.  Oxazepam and temazepam are available    as scheduled prescription medications.   Phentermine                     PRESENT      EXPECTED   Duloxetine                      PRESENT      EXPECTED Drug Present not Declared for Prescription Verification   Topiramate                      PRESENT      UNEXPECTED   Acetaminophen                   PRESENT      UNEXPECTED Drug Absent but Declared for Prescription Verification   Cyclobenzaprine                 Not Detected UNEXPECTED   Promethazine                    Not Detected UNEXPECTED ==================================================================== Test                      Result    Flag   Units      Ref Range   Creatinine              57               mg/dL      >=40 ==================================================================== Declared Medications:  The flagging and interpretation on this report are based on the  following declared medications.  Unexpected results may arise from  inaccuracies in the declared medications.  **Note: The testing scope of this panel includes these medications:  Cyclobenzaprine   Diazepam   Duloxetine   Phentermine   Promethazine   **Note: The testing scope of this  panel does not include following  reported medications:  Acyclovir   Budesonide   Docusate  Hydrochlorothiazide  (Triamterine-Hydrochlorthzide)  Levocetirizine  Linaclotide   Meclizine   Montelukast   Multivitamin  Potassium  Triamterene  (Triamterine-Hydrochlorthzide)  Valacyclovir  ==================================================================== For clinical consultation, please call [phone]. ====================================================================       ROS  Constitutional: Denies any fever or chills Gastrointestinal: No reported hemesis, hematochezia, vomiting, or acute GI distress Musculoskeletal: Denies any acute onset joint swelling, redness, loss of ROM, or weakness Neurological: No reported episodes of acute onset  apraxia, aphasia, dysarthria, agnosia, amnesia, paralysis, loss of coordination, or loss of consciousness  Medication Review  Atogepant , DULoxetine , Ubrogepant , amLODipine , aspirin-acetaminophen -caffeine , benzonatate , buPROPion , butalbital -acetaminophen -caffeine , hydrOXYzine , ipratropium, ketorolac , lansoprazole , linaclotide , methylPREDNISolone , multivitamin with minerals, ondansetron , oxybutynin , promethazine , promethazine -dextromethorphan, propranolol , rizatriptan , and triamterene -hydrochlorothiazide   History Review  Allergy : [name] is allergic to gluten meal, lactose, ranitidine, sulfa antibiotics, amphetamine-dextroamphetamine, lactose intolerance (gi), reglan [metoclopramide], topiramate  er, wheat, and soap. Drug: [name]  reports no history of drug use. Alcohol:  reports current alcohol use of about 2.0 - 4.0 standard drinks of alcohol per week. Tobacco:  reports that she quit smoking about 36 years ago. Her smoking use included cigarettes. She started smoking about 41 years ago. She has a 3.8 pack-year smoking history. She has never been exposed to tobacco smoke. She has never used smokeless tobacco. Social: [name]  reports that she  quit smoking about 36 years ago. Her smoking use included cigarettes. She started smoking about 41 years ago. She has a 3.8 pack-year smoking history. She has never been exposed to tobacco smoke. She has never used smokeless tobacco. She reports current alcohol use of about 2.0 - 4.0 standard drinks of alcohol per week. She reports that she does not use drugs. Medical:  has a past medical history of Fatty liver, Fibromyalgia, GERD (gastroesophageal reflux disease), Hemorrhoids, Hot flashes, Hypertension, IBS (irritable bowel syndrome), LUQ abdominal pain (10/25/2016), Migraines, Miscarriage, Multilevel degenerative disc disease, Neuroma, and Vocal cord dysfunction. Surgical: [name]  has a past surgical history that includes Elbow Debridement; Foot surgery; Cholecystectomy; Colonoscopy; Abdominal hysterectomy; Esophagogastroduodenoscopy (egd) with propofol  (N/A, 02/06/2018); Colonoscopy with propofol  (N/A, 12/22/2019); Colonoscopy with propofol  (N/A, 12/23/2019); Image guided sinus surgery (N/A, 09/28/2021); Maxillary antrostomy (Bilateral, 09/28/2021); Ethmoidectomy (N/A, 09/28/2021); Sphenoidectomy (Bilateral, 09/28/2021); Colonoscopy with propofol  (N/A, 01/01/2023); and polypectomy (01/01/2023). Family: family history includes Alcohol abuse in her father and son; Arthritis in her son and son; Asthma in her sister; COPD in her father, paternal grandmother, and sister; Cancer in her paternal grandfather and sister; Colon cancer in her sister; Colon polyps in her sister; Depression in her son; Diabetes in her maternal grandmother, mother, and son; Drug abuse in her son; Glaucoma in her mother; Hyperlipidemia in her mother; Hypertension in her mother; Kidney disease in her sister; Mental illness in her sister and son; Miscarriages / Stillbirths in her sister and sister; Uterine cancer in her sister.  Laboratory Chemistry Profile   Renal Lab Results  Component Value Date   BUN 13 09/02/2023   CREATININE 1.26 (H)  09/02/2023   BCR 13 11/02/2022   GFRAA >60 07/07/2019   GFRNONAA 49 (L) 09/02/2023    Hepatic Lab Results  Component Value Date   AST 39 11/02/2022   ALT 56 (H) 11/02/2022   ALBUMIN 4.4 11/02/2022   ALKPHOS 100 11/02/2022   LIPASE 33 12/15/2016    Electrolytes Lab Results  Component Value Date   NA 137 09/02/2023   K 3.7 09/02/2023   CL 101 09/02/2023   CALCIUM 9.6 09/02/2023   MG 2.2 01/16/2018    Bone Lab Results  Component Value Date   25OHVITD1 51 01/16/2018   25OHVITD2 <1.0 01/16/2018   25OHVITD3 50 01/16/2018    Inflammation (CRP: Acute Phase) (ESR: Chronic Phase) Lab Results  Component Value Date   CRP 0.8 07/05/2021   ESRSEDRATE 17 07/05/2021         Note: Above Lab results reviewed.  Recent Imaging Review  DG PAIN CLINIC C-ARM 1-60 MIN NO REPORT Fluoro was used,  but no Radiologist interpretation will be provided.  Please refer to "NOTES" tab for provider progress note. Note: Reviewed        Physical Exam  General appearance: Well nourished, well developed, and well hydrated. In no apparent acute distress Mental status: Alert, oriented x 3 (person, place, & time)       Respiratory: No evidence of acute respiratory distress Eyes: PERLA Vitals: There were no vitals taken for this visit. BMI: Estimated body mass index is 31.09 kg/m as calculated from the following:   Height as of 09/26/23: 5\' 2"  (1.575 m).   Weight as of 09/26/23: 170 lb (77.1 kg). Ideal: Ideal body weight: 50.1 kg (110 lb 7.2 oz) Adjusted ideal body weight: 60.9 kg (134 lb 4.3 oz)  Assessment   Diagnosis Status  No diagnosis found. Controlled Controlled Controlled   Updated Problems: No problems updated.  Plan of Care  Problem-specific:  Assessment and Plan            [name] has a current medication list which includes the following long-term medication(s): amlodipine , bupropion , duloxetine , ipratropium, lansoprazole , linzess , promethazine , propranolol ,  rizatriptan , and triamterene -hydrochlorothiazide .  Pharmacotherapy (Medications Ordered): No orders of the defined types were placed in this encounter.  Orders:  No orders of the defined types were placed in this encounter.  Follow-up plan:   No follow-ups on file.     Interventional Therapies  Risk  Complexity Considerations:   Anxiety/Depression  GERD  HTN  IBS  Stage 3 CKD     Planned  Pending:   Therapeutic bilateral C2-3 and C3-4 cervical facet block (TON, C2, C3, C4 MBB) with Dr. Rhesa Celeste.   Under consideration:   Therapeutic bilateral C2-3 and C3-4 cervical facet block (TON, C2, C3, C4 MBB)  Palliative/therapeutic right cervical ESI #4    Completed:   Diagnostic/therapeutic right L3-4 LESI x1 (09/08/2021) (100/100/100/100)  Palliative bilateral cervical facet MBB x2 (06/13/2018) (100/100/0/<25)  Diagnostic bilateral lumbar facet MBB x2 (08/08/2018) [facility])  Diagnostic right Cervical ESI x3 (01/11/2023) (100/50/100/100)  Therapeutic right cervical facet RFA x1 (07/23/2018) (100/50/0)    Completed by other providers:   Therapeutic sphenopalatine ganglion Blk by Dr. Bernetta Brilliant x2 (03/04/2018) Diagnostic bilateral upper extremity EMG/PNCV by Dr. Bernetta Brilliant: Right Grade II & Left Grade I CTS (08/23/2018)    Therapeutic  Palliative (PRN) options:   Therapeutic/palliative cervical facet MBB   Therapeutic/palliativelumbar facet MBB   Therapeutic/palliative Cervical ESI   Therapeutic/palliative cervical facet RFA       Recent Visits Date Type Provider Dept  09/26/23 Procedure visit Cephus Collin, MD [facility]  09/19/23 Office Visit Renaldo Caroli, MD [facility]  Showing recent visits within past 90 days and meeting all other requirements Future Appointments Date Type Provider Dept  10/01/23 Appointment Renaldo Caroli, MD [facility]  10/25/23 Appointment Renaldo Caroli, MD [facility]  Showing future appointments within next  90 days and meeting all other requirements  I discussed the assessment and treatment plan with the patient. The patient was provided an opportunity to ask questions and all were answered. The patient agreed with the plan and demonstrated an understanding of the instructions.  Patient advised to call back or seek an in-person evaluation if the symptoms or condition worsens.  Duration of encounter: *** minutes.  Total time on encounter, as per AMA guidelines included both the face-to-face and non-face-to-face time personally spent by the physician and/or other qualified health care professional(s) on the day of the encounter (includes  time in activities that require the physician or other qualified health care professional and does not include time in activities normally performed by clinical staff). Physician's time may include the following activities when performed: Preparing to see the patient (e.g., pre-charting review of records, searching for previously ordered imaging, lab work, and nerve conduction tests) Review of prior analgesic pharmacotherapies. Reviewing PMP Interpreting ordered tests (e.g., lab work, imaging, nerve conduction tests) Performing post-procedure evaluations, including interpretation of diagnostic procedures Obtaining and/or reviewing separately obtained history Performing a medically appropriate examination and/or evaluation Counseling and educating the patient/family/caregiver Ordering medications, tests, or procedures Referring and communicating with other health care professionals (when not separately reported) Documenting clinical information in the electronic or other health record Independently interpreting results (not separately reported) and communicating results to the patient/ family/caregiver Care coordination (not separately reported)  Note by: Candi Chafe, MD (TTS and AI technology used. I apologize for any typographical errors that were not  detected and corrected.) Date: 10/01/2023; Time: 1:05 PM

## 2023-10-01 ENCOUNTER — Encounter: Payer: Self-pay | Admitting: Family Medicine

## 2023-10-01 ENCOUNTER — Other Ambulatory Visit: Payer: Self-pay

## 2023-10-01 ENCOUNTER — Ambulatory Visit: Attending: Pain Medicine | Admitting: Pain Medicine

## 2023-10-01 ENCOUNTER — Other Ambulatory Visit (HOSPITAL_COMMUNITY): Payer: Self-pay

## 2023-10-01 ENCOUNTER — Ambulatory Visit: Admitting: Family Medicine

## 2023-10-01 VITALS — BP 134/92 | HR 94 | Temp 97.0°F | Resp 16 | Ht 62.0 in | Wt 170.0 lb

## 2023-10-01 VITALS — BP 128/85 | HR 104 | Ht 62.0 in | Wt 175.8 lb

## 2023-10-01 DIAGNOSIS — M5030 Other cervical disc degeneration, unspecified cervical region: Secondary | ICD-10-CM | POA: Diagnosis not present

## 2023-10-01 DIAGNOSIS — M542 Cervicalgia: Secondary | ICD-10-CM | POA: Diagnosis not present

## 2023-10-01 DIAGNOSIS — M5481 Occipital neuralgia: Secondary | ICD-10-CM | POA: Insufficient documentation

## 2023-10-01 DIAGNOSIS — D582 Other hemoglobinopathies: Secondary | ICD-10-CM | POA: Diagnosis not present

## 2023-10-01 DIAGNOSIS — G8929 Other chronic pain: Secondary | ICD-10-CM | POA: Insufficient documentation

## 2023-10-01 DIAGNOSIS — R937 Abnormal findings on diagnostic imaging of other parts of musculoskeletal system: Secondary | ICD-10-CM | POA: Diagnosis not present

## 2023-10-01 DIAGNOSIS — G4486 Cervicogenic headache: Secondary | ICD-10-CM | POA: Insufficient documentation

## 2023-10-01 DIAGNOSIS — G43711 Chronic migraine without aura, intractable, with status migrainosus: Secondary | ICD-10-CM | POA: Diagnosis not present

## 2023-10-01 DIAGNOSIS — R7989 Other specified abnormal findings of blood chemistry: Secondary | ICD-10-CM

## 2023-10-01 MED ORDER — OXYCODONE HCL 5 MG PO TABS
5.0000 mg | ORAL_TABLET | Freq: Three times a day (TID) | ORAL | 0 refills | Status: DC | PRN
  Filled 2023-10-01: qty 42, 14d supply, fill #0

## 2023-10-01 MED ORDER — PREDNISONE 20 MG PO TABS
ORAL_TABLET | ORAL | 0 refills | Status: AC
  Filled 2023-10-01: qty 18, 9d supply, fill #0

## 2023-10-01 NOTE — Patient Instructions (Signed)

## 2023-10-01 NOTE — Progress Notes (Signed)
 Safety precautions to be maintained throughout the outpatient stay will include: orient to surroundings, keep bed in low position, maintain call bell within reach at all times, provide assistance with transfer out of bed and ambulation.   While doing procedure patient had to turn off light and lay down r/t nausea Patient has not taken anything for nausea, states she has at home.

## 2023-10-01 NOTE — Progress Notes (Signed)
 Established patient visit   Patient: Elizabeth Bowers   DOB: 03/10/1966   58 y.o. Female  MRN: [id number] Visit Date: 10/01/2023  Today's healthcare provider: Aden Agreste, MD   Chief Complaint  Patient presents with   Migraine    Pt contacted office on 09/27/23 with concerns of Severe migraines with vomiting and lightheadedness. Reports they began end of March. States that nothing has helped and that she has been in ER and Urgent Care and that no one has done anything.    Subjective    Migraine    HPI     Migraine    Additional comments: Pt contacted office on 09/27/23 with concerns of Severe migraines with vomiting and lightheadedness. Reports they began end of March. States that nothing has helped and that she has been in ER and Urgent Care and that no one has done anything.       Last edited by Pasty Bongo, CMA on 10/01/2023  1:05 PM.       Discussed the use of AI scribe software for clinical note transcription with the patient, who gave verbal consent to proceed.  History of Present Illness   The patient, with a history of polycythemia, presents with a severe, ongoing migraine that has been unresponsive to multiple treatments. The migraine, which started on March 23rd, has been so debilitating that it has significantly impacted the patient's ability to work and perform daily activities. The patient describes the pain as if a vice grip is constantly tightening around her head. The migraine is accompanied by frequent vomiting, which has led to dehydration and has affected the patient's lab results. The patient has tried numerous treatments, including Medrol  Dosepak, Ubrelvy , Qulipta , triptans, magnesium , Topamax , and B2, but none have provided relief. The patient has also received two migraine cocktails in the emergency room, which only provided temporary relief through sleep. The patient's frustration is heightened by the lack of progress in managing the migraines and  the perceived lack of support from her employer and some healthcare providers.         Medications: Outpatient Medications Prior to Visit  Medication Sig   amLODipine  (NORVASC ) 10 MG tablet Take 1 tablet (10 mg) by mouth daily.   aspirin-acetaminophen -caffeine  (EXCEDRIN  MIGRAINE) 250-250-65 MG tablet Take 1 tablet by mouth every 6 (six) hours as needed for headache.   benzonatate  (TESSALON ) 100 MG capsule Take 2 capsules (200 mg total) by mouth every 8 (eight) hours.   butalbital -acetaminophen -caffeine  (FIORICET ) 50-325-40 MG tablet Take 1-2 tablets by mouth every 6 (six) hours as needed for headache.   hydrOXYzine  (ATARAX ) 10 MG tablet Take 1 tablet (10 mg total) by mouth 3 (three) times daily as needed.   ipratropium (ATROVENT ) 0.06 % nasal spray Place 2 sprays into both nostrils 4 (four) times daily.   ketorolac  (TORADOL ) 10 MG tablet Take 1 tablet (10 mg total) by mouth every 6 (six) hours as needed.   lansoprazole  (PREVACID ) 30 MG capsule Take 1 capsule (30 mg total) by mouth 2 (two) times daily before a meal.   LINZESS  290 MCG CAPS capsule Take 1 capsule (290 mcg total) by mouth daily.   Multiple Vitamin (MULTIVITAMIN WITH MINERALS) TABS tablet Take 1 tablet by mouth daily.   ondansetron  (ZOFRAN -ODT) 4 MG disintegrating tablet Take 1 tablet (4 mg total) by mouth every 8 (eight) hours as needed for nausea or vomiting.   oxyCODONE  (OXY IR/ROXICODONE ) 5 MG immediate release tablet Take 1 tablet (5 mg total)  by mouth every 8 (eight) hours as needed for up to 14 days for severe pain (pain score 7-10). Must last 30 days.   predniSONE  (DELTASONE ) 20 MG tablet Take 3 tablets (60 mg total) by mouth daily with breakfast for 3 days, THEN 2 tablets (40 mg total) daily with breakfast for 3 days, THEN 1 tablet (20 mg total) daily with breakfast for 3 days.   promethazine  (PHENERGAN ) 25 MG tablet Take 1 tablet (25 mg total) by mouth every 6 (six) hours as needed for nausea or vomiting.    promethazine -dextromethorphan (PROMETHAZINE -DM) 6.25-15 MG/5ML syrup Take 5 mLs by mouth 4 (four) times daily as needed.   rizatriptan  (MAXALT -MLT) 5 MG disintegrating tablet Dissolve 1 tablet (5 mg total) in the mouth as directed for Migraine. May take a second dose after 2 hours if needed.   triamterene -hydrochlorothiazide  (MAXZIDE -25) 37.5-25 MG tablet Take 1 tablet by mouth daily.   No facility-administered medications prior to visit.    Review of Systems     Objective    BP 128/85 (BP Location: Left Arm, Patient Position: Sitting, Cuff Size: Normal)   Pulse (!) 104   Ht 5\' 2"  (1.575 m)   Wt 175 lb 12.8 oz (79.7 kg)   SpO2 100%   BMI 32.15 kg/m    Physical Exam Vitals reviewed.  Constitutional:      Appearance: She is well-developed.     Comments: Tearful, sitting in quiet, dark room  HENT:     Head: Normocephalic and atraumatic.  Eyes:     General: No scleral icterus.    Conjunctiva/sclera: Conjunctivae normal.  Cardiovascular:     Rate and Rhythm: Normal rate and regular rhythm.  Pulmonary:     Effort: Pulmonary effort is normal. No respiratory distress.  Skin:    General: Skin is warm and dry.     Findings: No rash.  Neurological:     Mental Status: She is alert and oriented to person, place, and time.      No results found for any visits on 10/01/23.  Assessment & Plan     Problem List Items Addressed This Visit   None Visit Diagnoses       Intractable chronic migraine without aura and with status migrainosus    -  Primary     Elevated hemoglobin (HCC)       Relevant Orders   CBC w/Diff/Platelet   Comprehensive metabolic panel with GFR     Elevated serum creatinine       Relevant Orders   Comprehensive metabolic panel with GFR           Chronic Migraine Chronic migraines since August 26, 2023, with severe symptoms including vomiting, photophobia, and phonophobia. Previous treatments, including Toradol , steroid taper, and migraine cocktails,  have been ineffective. Neurology has been involved but has not provided effective relief. Current medications like Qulipta  and triptans are not effective. She experiences significant functional impairment and is considering disability due to the severity of the migraines. Neurology has suggested seeing a regular physician as she has exhausted her options. She has tried various medications and treatments without success, including nerve blocks that previously provided relief for three years. - Administer oxycodone  for pain relief per Pain management. - continue PO Phenergan /suppositories/Zofran  for nausea management. - Order MRI to evaluate for potential neck involvement. - Consider alternative migraine cocktails in-office if needed.  Depression Depression secondary to chronic migraine and functional impairment. She reports feeling depressed due to prolonged bed rest and inability  to perform daily activities.  Dehydration Dehydration due to frequent vomiting associated with chronic migraines, contributing to elevated blood counts and impaired kidney function. - Encourage adequate fluid intake to prevent dehydration. - Repeat labs when adequately hydrated to assess true kidney function and blood counts.  Polycythemia Polycythemia with elevated hemoglobin and hematocrit levels, likely exacerbated by dehydration from frequent vomiting. Previous polycythemia diagnosis in 2022. No current intervention planned until dehydration is resolved. - Repeat labs when adequately hydrated to reassess polycythemia status. - Consider JAK2 mutation testing if polycythemia persists after rehydration.        No follow-ups on file.      Total time spent on today's visit was greater than 40 minutes, including both face-to-face time and nonface-to-face time personally spent on review of chart (labs and imaging), discussing labs and goals, discussing further work-up, treatment options, reviewing outside records,  answering patient's questions, and coordinating care.    Aden Agreste, MD  [facility] [phone] (phone) [phone] (fax)  [facility]

## 2023-10-02 ENCOUNTER — Other Ambulatory Visit (HOSPITAL_COMMUNITY): Payer: Self-pay

## 2023-10-02 ENCOUNTER — Other Ambulatory Visit: Payer: Self-pay

## 2023-10-02 MED ORDER — AMLODIPINE BESYLATE 10 MG PO TABS
10.0000 mg | ORAL_TABLET | Freq: Every day | ORAL | 0 refills | Status: DC
  Filled 2023-10-02: qty 90, 90d supply, fill #0

## 2023-10-02 MED ORDER — LINZESS 290 MCG PO CAPS
290.0000 ug | ORAL_CAPSULE | Freq: Every day | ORAL | 0 refills | Status: DC
  Filled 2023-10-02: qty 90, 90d supply, fill #0

## 2023-10-02 NOTE — Telephone Encounter (Signed)
 Requested Prescriptions  Pending Prescriptions Disp Refills   amLODipine  (NORVASC ) 10 MG tablet 90 tablet 0    Sig: Take 1 tablet (10 mg) by mouth daily.     Cardiovascular: Calcium Channel Blockers 2 Failed - 10/02/2023 11:23 AM      Failed - Valid encounter within last 6 months    Recent Outpatient Visits           Yesterday Intractable chronic migraine without aura and with status migrainosus   [facility] [place], Stan Eans, MD       Future Appointments             In 1 month Bacigalupo, Stan Eans, MD [facility]            Passed - Last BP in normal range    BP Readings from Last 1 Encounters:  10/01/23 128/85         Passed - Last Heart Rate in normal range    Pulse Readings from Last 1 Encounters:  10/01/23 (!) 104          LINZESS  290 MCG CAPS capsule 90 capsule 0    Sig: Take 1 capsule (290 mcg total) by mouth daily.     Gastroenterology: Irritable Bowel Syndrome Failed - 10/02/2023 11:23 AM      Failed - Valid encounter within last 12 months    Recent Outpatient Visits           Yesterday Intractable chronic migraine without aura and with status migrainosus   [facility] [place], Stan Eans, MD       Future Appointments             In 1 month Bacigalupo, Stan Eans, MD [facility]

## 2023-10-09 DIAGNOSIS — D582 Other hemoglobinopathies: Secondary | ICD-10-CM | POA: Diagnosis not present

## 2023-10-09 DIAGNOSIS — R7989 Other specified abnormal findings of blood chemistry: Secondary | ICD-10-CM | POA: Diagnosis not present

## 2023-10-10 ENCOUNTER — Other Ambulatory Visit: Payer: Self-pay

## 2023-10-10 ENCOUNTER — Encounter: Payer: Self-pay | Admitting: Family Medicine

## 2023-10-10 DIAGNOSIS — D72829 Elevated white blood cell count, unspecified: Secondary | ICD-10-CM

## 2023-10-10 LAB — CBC WITH DIFFERENTIAL/PLATELET
Basophils Absolute: 0 10*3/uL (ref 0.0–0.2)
Basos: 0 %
EOS (ABSOLUTE): 0.1 10*3/uL (ref 0.0–0.4)
Eos: 0 %
Hematocrit: 44.4 % (ref 34.0–46.6)
Hemoglobin: 14.8 g/dL (ref 11.1–15.9)
Immature Grans (Abs): 0.2 10*3/uL — ABNORMAL HIGH (ref 0.0–0.1)
Immature Granulocytes: 1 %
Lymphocytes Absolute: 1.5 10*3/uL (ref 0.7–3.1)
Lymphs: 8 %
MCH: 30.7 pg (ref 26.6–33.0)
MCHC: 33.3 g/dL (ref 31.5–35.7)
MCV: 92 fL (ref 79–97)
Monocytes Absolute: 0.9 10*3/uL (ref 0.1–0.9)
Monocytes: 5 %
Neutrophils Absolute: 15.8 10*3/uL — ABNORMAL HIGH (ref 1.4–7.0)
Neutrophils: 86 %
Platelets: 305 10*3/uL (ref 150–450)
RBC: 4.82 x10E6/uL (ref 3.77–5.28)
RDW: 12.5 % (ref 11.7–15.4)
WBC: 18.5 10*3/uL — ABNORMAL HIGH (ref 3.4–10.8)

## 2023-10-10 LAB — COMPREHENSIVE METABOLIC PANEL WITH GFR
ALT: 41 IU/L — ABNORMAL HIGH (ref 0–32)
AST: 21 IU/L (ref 0–40)
Albumin: 4.5 g/dL (ref 3.8–4.9)
Alkaline Phosphatase: 84 IU/L (ref 44–121)
BUN/Creatinine Ratio: 27 — ABNORMAL HIGH (ref 9–23)
BUN: 28 mg/dL — ABNORMAL HIGH (ref 6–24)
Bilirubin Total: 0.4 mg/dL (ref 0.0–1.2)
CO2: 21 mmol/L (ref 20–29)
Calcium: 9.7 mg/dL (ref 8.7–10.2)
Chloride: 99 mmol/L (ref 96–106)
Creatinine, Ser: 1.03 mg/dL — ABNORMAL HIGH (ref 0.57–1.00)
Globulin, Total: 1.9 g/dL (ref 1.5–4.5)
Glucose: 125 mg/dL — ABNORMAL HIGH (ref 70–99)
Potassium: 4 mmol/L (ref 3.5–5.2)
Sodium: 139 mmol/L (ref 134–144)
Total Protein: 6.4 g/dL (ref 6.0–8.5)
eGFR: 63 mL/min/{1.73_m2} (ref >59)

## 2023-10-10 NOTE — Telephone Encounter (Signed)
 Seen by patient Elizabeth Bowers on 10/10/2023  3:16 PM

## 2023-10-12 ENCOUNTER — Telehealth: Payer: Self-pay | Admitting: Pain Medicine

## 2023-10-12 NOTE — Telephone Encounter (Signed)
 I spoke with patient after she left vmail stating she has not had MRI's done and wasn't sure if she should keep appt on Monday. I looked to see if they needed auth. [facility] had put note the MRI did not need scheduled and she had spoke with patient who stated she wanted to check and make sure her insurance was still valid. Patient states she spoke with someone stating her insurance was ok. And they could schedule the MRI. I apologized as I did not get the message. I gave her the phone number to call MRI scheduling as it would save her phone calls if I got the wrong date. She hung up. I am canceling Monday appt.

## 2023-10-13 ENCOUNTER — Ambulatory Visit (HOSPITAL_COMMUNITY)

## 2023-10-15 ENCOUNTER — Ambulatory Visit: Admitting: Pain Medicine

## 2023-10-15 ENCOUNTER — Telehealth: Payer: Self-pay | Admitting: Pain Medicine

## 2023-10-15 NOTE — Telephone Encounter (Signed)
 Patient is scheduled for MRI on Wed 10-17-23. She is still out of work per orders from Dr Barth Borne. The FMLA papers that were sent in states she is out indefinitely and her work is not accepting that. They need an estimate of the date and refaxed to them. Please call [place] if you have any further questions.

## 2023-10-15 NOTE — Telephone Encounter (Signed)
 Does anyone know who filled out these papers?  I can't find it in the media section.

## 2023-10-17 ENCOUNTER — Ambulatory Visit
Admission: RE | Admit: 2023-10-17 | Discharge: 2023-10-17 | Disposition: A | Discharge status: Home / Self Care | Source: Ambulatory Visit | Attending: Pain Medicine | Admitting: Pain Medicine

## 2023-10-17 DIAGNOSIS — M5481 Occipital neuralgia: Secondary | ICD-10-CM | POA: Insufficient documentation

## 2023-10-17 DIAGNOSIS — M4802 Spinal stenosis, cervical region: Secondary | ICD-10-CM | POA: Diagnosis not present

## 2023-10-17 DIAGNOSIS — G8929 Other chronic pain: Secondary | ICD-10-CM | POA: Insufficient documentation

## 2023-10-17 DIAGNOSIS — M542 Cervicalgia: Secondary | ICD-10-CM | POA: Diagnosis not present

## 2023-10-17 DIAGNOSIS — G4486 Cervicogenic headache: Secondary | ICD-10-CM | POA: Insufficient documentation

## 2023-10-17 DIAGNOSIS — M5030 Other cervical disc degeneration, unspecified cervical region: Secondary | ICD-10-CM | POA: Diagnosis not present

## 2023-10-17 DIAGNOSIS — R937 Abnormal findings on diagnostic imaging of other parts of musculoskeletal system: Secondary | ICD-10-CM | POA: Diagnosis not present

## 2023-10-17 DIAGNOSIS — M5011 Cervical disc disorder with radiculopathy,  high cervical region: Secondary | ICD-10-CM | POA: Diagnosis not present

## 2023-10-23 ENCOUNTER — Ambulatory Visit: Payer: Self-pay

## 2023-10-23 NOTE — Progress Notes (Signed)
 Department: [facility] Date: 10/24/2023  Encounter Type: Follow-up evaluation. Event: NO SHOW.  Reason: None communicated.

## 2023-10-23 NOTE — Telephone Encounter (Signed)
  Chief Complaint: headache Symptoms: refuses Frequency: refuses Pertinent Negatives: Patient denies refuses Disposition: [] ED /[] Urgent Care (no appt availability in office) / [] Appointment(In office/virtual)/ []  [place] [facility]/ [] Home Care/ [] Refused Recommended Disposition /[]  Mobile Bus/ [x]  Follow-up with PCP Additional Notes:  History Headaches/migraines. Refuses to answer triage questions. She is "upset with Dr. B who cancelled her physical (explicit) again, she is very frustrated, swearing and yelling at this Clinical research associate. 'Toradol  and migraine cocktails' she received in the past "do not work". Seen by neurologist "they couldn't figure it out'. States she is frustrated no one gives Dr. B her message and no one takes her serious, trying to differ her to different places. Patient requesting to speak with office staff directly, this writer called to [facility] and explained situation, offered acute visit with alternate provider 10/24/23 10:40am, Dr. Geraldene Kleine no availability until June, call transfer not accepted, this writer returned to line with patient and offered acute visit with alternate provider 10/24/23 patient became upset and swearing again she wants to see Dr. B and stop being pushed off, I let her know Dr. Geraldene Kleine does not have availability until June she stated "fine then I will suffer for two months' started talking with another person in the home with her and told them the doctor refuses to see her for two months, reiterated we could schedule acute visit with alternate provider. No appointment scheduled at this time. Insists on emergency appointment with pcp. Please follow up.    Copied from CRM [id number]. Topic: Clinical - Red Word Triage >> Oct 23, 2023  4:40 PM [place] B wrote: [facility] that prompted transfer to Nurse Triage: pt has severe headaches Reason for Disposition  [1] Caller demands to speak with the PCP AND [2] about sick adult (or sick caller)  Answer Assessment - Initial  Assessment Questions Refused triage  Protocols used: Headache-A-AH, Difficult Call-A-AH

## 2023-10-24 ENCOUNTER — Telehealth: Payer: Self-pay

## 2023-10-24 ENCOUNTER — Ambulatory Visit (HOSPITAL_BASED_OUTPATIENT_CLINIC_OR_DEPARTMENT_OTHER): Admitting: Pain Medicine

## 2023-10-24 ENCOUNTER — Other Ambulatory Visit (HOSPITAL_BASED_OUTPATIENT_CLINIC_OR_DEPARTMENT_OTHER): Admitting: Pain Medicine

## 2023-10-24 DIAGNOSIS — M4310 Spondylolisthesis, site unspecified: Secondary | ICD-10-CM

## 2023-10-24 DIAGNOSIS — M5412 Radiculopathy, cervical region: Secondary | ICD-10-CM

## 2023-10-24 DIAGNOSIS — G4486 Cervicogenic headache: Secondary | ICD-10-CM

## 2023-10-24 DIAGNOSIS — M4692 Unspecified inflammatory spondylopathy, cervical region: Secondary | ICD-10-CM

## 2023-10-24 DIAGNOSIS — R937 Abnormal findings on diagnostic imaging of other parts of musculoskeletal system: Secondary | ICD-10-CM

## 2023-10-24 DIAGNOSIS — M542 Cervicalgia: Secondary | ICD-10-CM

## 2023-10-24 DIAGNOSIS — Z91199 Patient's noncompliance with other medical treatment and regimen due to unspecified reason: Secondary | ICD-10-CM

## 2023-10-24 DIAGNOSIS — M4802 Spinal stenosis, cervical region: Secondary | ICD-10-CM

## 2023-10-24 DIAGNOSIS — G8929 Other chronic pain: Secondary | ICD-10-CM

## 2023-10-24 NOTE — Telephone Encounter (Signed)
 Telephone call from patient stating that she has an appointment this morning, however she does not see the results. She is questioning if she still needs to come in or if she should reschedule.

## 2023-10-24 NOTE — Progress Notes (Signed)
 Results of cervical MRI reviewed.  Based on those results and the persistent pain, we will offer the patient a cervical epidural steroid injection and a referral to neurosurgery for evaluation of a possible decompression.

## 2023-10-24 NOTE — Patient Instructions (Signed)

## 2023-10-24 NOTE — Telephone Encounter (Signed)
 Patient stating that she needs documentation with a return to work date. She states that her employer needs an exact date, not Unknown. She is worried about losing her job. Results not back from MRI, so patient had to cancel appointment for today 10/24/23.

## 2023-10-24 NOTE — Telephone Encounter (Signed)
 Spoke with Dr Naveira.  Patient needs to be rescheduled.  Dr Barth Borne states he will not be writing any further work notes at this time.

## 2023-10-25 ENCOUNTER — Ambulatory Visit: Admitting: Pain Medicine

## 2023-10-25 NOTE — Telephone Encounter (Signed)
 Detailed VM per DPR. Ok to advise if patient returns call

## 2023-10-25 NOTE — Telephone Encounter (Signed)
 Ok to explain to patient that I was out sick myself.  There really is no availability next week (need that HFU for someone just discharged) and then I am OOO for a week. I am seeing this message and this time I genuinely do not have any openings. Maybe she would agree to see another MD? I don't have any additional answers for her headaches that Neuro "can't figure out"

## 2023-10-30 ENCOUNTER — Ambulatory Visit
Admission: RE | Admit: 2023-10-30 | Discharge: 2023-10-30 | Disposition: A | Discharge status: Home / Self Care | Source: Ambulatory Visit | Attending: Pain Medicine | Admitting: Pain Medicine

## 2023-10-30 ENCOUNTER — Ambulatory Visit: Attending: Pain Medicine | Admitting: Pain Medicine

## 2023-10-30 ENCOUNTER — Encounter: Payer: Self-pay | Admitting: Pain Medicine

## 2023-10-30 VITALS — BP 121/76 | HR 70 | Temp 97.1°F | Resp 14 | Ht 62.0 in | Wt 180.0 lb

## 2023-10-30 DIAGNOSIS — M5030 Other cervical disc degeneration, unspecified cervical region: Secondary | ICD-10-CM | POA: Diagnosis not present

## 2023-10-30 DIAGNOSIS — M4692 Unspecified inflammatory spondylopathy, cervical region: Secondary | ICD-10-CM | POA: Insufficient documentation

## 2023-10-30 DIAGNOSIS — M4802 Spinal stenosis, cervical region: Secondary | ICD-10-CM | POA: Diagnosis not present

## 2023-10-30 DIAGNOSIS — M79602 Pain in left arm: Secondary | ICD-10-CM | POA: Diagnosis present

## 2023-10-30 DIAGNOSIS — R937 Abnormal findings on diagnostic imaging of other parts of musculoskeletal system: Secondary | ICD-10-CM | POA: Insufficient documentation

## 2023-10-30 DIAGNOSIS — M4310 Spondylolisthesis, site unspecified: Secondary | ICD-10-CM | POA: Insufficient documentation

## 2023-10-30 DIAGNOSIS — M79601 Pain in right arm: Secondary | ICD-10-CM | POA: Insufficient documentation

## 2023-10-30 DIAGNOSIS — M542 Cervicalgia: Secondary | ICD-10-CM | POA: Insufficient documentation

## 2023-10-30 DIAGNOSIS — G8929 Other chronic pain: Secondary | ICD-10-CM | POA: Insufficient documentation

## 2023-10-30 DIAGNOSIS — M47812 Spondylosis without myelopathy or radiculopathy, cervical region: Secondary | ICD-10-CM | POA: Insufficient documentation

## 2023-10-30 DIAGNOSIS — G4486 Cervicogenic headache: Secondary | ICD-10-CM | POA: Insufficient documentation

## 2023-10-30 DIAGNOSIS — M5412 Radiculopathy, cervical region: Secondary | ICD-10-CM | POA: Insufficient documentation

## 2023-10-30 MED ORDER — ROPIVACAINE HCL 2 MG/ML IJ SOLN
1.0000 mL | Freq: Once | INTRAMUSCULAR | Status: AC
  Administered 2023-10-30: 1 mL via EPIDURAL

## 2023-10-30 MED ORDER — DEXAMETHASONE SODIUM PHOSPHATE 10 MG/ML IJ SOLN
INTRAMUSCULAR | Status: AC
  Filled 2023-10-30: qty 1

## 2023-10-30 MED ORDER — IOHEXOL 180 MG/ML  SOLN
10.0000 mL | Freq: Once | INTRAMUSCULAR | Status: AC
  Administered 2023-10-30: 10 mL via EPIDURAL

## 2023-10-30 MED ORDER — SODIUM CHLORIDE (PF) 0.9 % IJ SOLN
INTRAMUSCULAR | Status: AC
Start: 2023-10-30 — End: ?
  Filled 2023-10-30: qty 10

## 2023-10-30 MED ORDER — LIDOCAINE HCL 2 % IJ SOLN
INTRAMUSCULAR | Status: AC
  Filled 2023-10-30: qty 20

## 2023-10-30 MED ORDER — FENTANYL CITRATE (PF) 100 MCG/2ML IJ SOLN
INTRAMUSCULAR | Status: AC
Start: 2023-10-30 — End: ?
  Filled 2023-10-30: qty 2

## 2023-10-30 MED ORDER — MIDAZOLAM HCL 5 MG/5ML IJ SOLN
0.5000 mg | Freq: Once | INTRAMUSCULAR | Status: AC
  Administered 2023-10-30: 2 mg via INTRAVENOUS

## 2023-10-30 MED ORDER — IOHEXOL 180 MG/ML  SOLN
INTRAMUSCULAR | Status: AC
  Filled 2023-10-30: qty 10

## 2023-10-30 MED ORDER — LIDOCAINE HCL 2 % IJ SOLN
20.0000 mL | Freq: Once | INTRAMUSCULAR | Status: AC
  Administered 2023-10-30: 400 mg

## 2023-10-30 MED ORDER — SODIUM CHLORIDE 0.9% FLUSH
1.0000 mL | Freq: Once | INTRAVENOUS | Status: AC
  Administered 2023-10-30: 1 mL

## 2023-10-30 MED ORDER — PENTAFLUOROPROP-TETRAFLUOROETH EX AERO: INHALATION_SPRAY | Freq: Once | CUTANEOUS | Status: AC

## 2023-10-30 MED ORDER — MIDAZOLAM HCL 5 MG/5ML IJ SOLN
INTRAMUSCULAR | Status: AC
  Filled 2023-10-30: qty 5

## 2023-10-30 MED ORDER — FENTANYL CITRATE (PF) 100 MCG/2ML IJ SOLN
25.0000 ug | INTRAMUSCULAR | Status: DC | PRN
  Administered 2023-10-30: 50 ug via INTRAVENOUS

## 2023-10-30 MED ORDER — ROPIVACAINE HCL 2 MG/ML IJ SOLN
INTRAMUSCULAR | Status: AC
Start: 2023-10-30 — End: ?
  Filled 2023-10-30: qty 20

## 2023-10-30 MED ORDER — DEXAMETHASONE SODIUM PHOSPHATE 10 MG/ML IJ SOLN
10.0000 mg | Freq: Once | INTRAMUSCULAR | Status: AC
  Administered 2023-10-30: 10 mg

## 2023-10-30 NOTE — Progress Notes (Signed)
 PROVIDER NOTE: Interpretation of information contained herein should be left to medically-trained personnel. Specific patient instructions are provided elsewhere under "Patient Instructions" section of medical record. This document was created in part using STT-dictation technology, any transcriptional errors that may result from this process are unintentional.  Patient: Elizabeth Bowers Type: Established DOB: 01/25/1966 MRN: [id number] PCP: Mazie Speed, MD  Service: Procedure DOS: 10/30/2023 Setting: Ambulatory Location: Ambulatory outpatient facility Delivery: Face-to-face Provider: Candi Chafe, MD Specialty: Interventional Pain Management Specialty designation: 09 Location: Outpatient facility Ref. Prov.: Bacigalupo, Stan Eans, MD       Interventional Therapy   Procedure: Cervical Epidural Steroid injection (CESI) (Interlaminar) #4  Laterality: Right  Level: C7-T1 Imaging: Fluoroscopy-assisted DOS: 10/30/2023  Performed by: Renaldo Caroli, MD Anesthesia: Local anesthesia (1-2% Lidocaine ) Anxiolysis: IV Versed  2.0 mg Sedation: Moderate Sedation Fentanyl  1 mL (50 mcg)   Purpose: Diagnostic/Therapeutic Indications: Cervicalgia, cervical radicular pain, degenerative disc disease, severe enough to impact quality of life or function. 1. Cervicalgia (Bilateral) (R>L)   2. Cervicogenic headache   3. Cervical spondylitis with radiculitis (HCC) (C6) (Bilateral) (R>L)   4. Cervical radiculopathy at C6 (Right)   5. Cervical Grade 1 (2 mm) Retrolisthesis C5 over C6   6. Cervical foraminal stenosis (C3-4 & C5-6) (Bilateral)   7. Cervical facet syndrome (Bilateral) (R>L)   8. DDD (degenerative disc disease), cervical   9. Chronic upper extremity pain (4th area of Pain) (Bilateral) (R>L)   10. Radicular pain of shoulder (Right)   11. Abnormal MRI, cervical spine (10/24/2023)    NAS-11 score:   Pre-procedure: 7 /10   Post-procedure: 0-No pain/10      Position  Prep   Materials:  Location setting: Procedure suite Position: Prone, on modified reverse trendelenburg to facilitate breathing, with head in head-cradle. Pillows positioned under chest (below chin-level) with cervical spine flexed. Safety Precautions: Patient was assessed for positional comfort and pressure points before starting the procedure. Prepping solution: DuraPrep (Iodine Povacrylex [0.7% available iodine] and Isopropyl Alcohol, 74% w/w) Prep Area: Entire  cervicothoracic region Approach: percutaneous, paramedial Intended target: Posterior cervical epidural space Materials Procedure:  Tray: Epidural Needle(s): Epidural (Tuohy) Qty: 1 Length: (90mm) 3.5-inch Gauge: 17G  H&P (Pre-op Assessment):  Elizabeth Bowers is a 58 y.o. (year old), female patient, seen today for interventional treatment. She  has a past surgical history that includes Elbow Debridement; Foot surgery; Cholecystectomy; Colonoscopy; Abdominal hysterectomy; Esophagogastroduodenoscopy (egd) with propofol  (N/A, 02/06/2018); Colonoscopy with propofol  (N/A, 12/22/2019); Colonoscopy with propofol  (N/A, 12/23/2019); Image guided sinus surgery (N/A, 09/28/2021); Maxillary antrostomy (Bilateral, 09/28/2021); Ethmoidectomy (N/A, 09/28/2021); Sphenoidectomy (Bilateral, 09/28/2021); Colonoscopy with propofol  (N/A, 01/01/2023); and polypectomy (01/01/2023). Elizabeth Bowers has a current medication list which includes the following prescription(s): amlodipine , excedrin  migraine, benzonatate , butalbital -acetaminophen -caffeine , hydroxyzine , ipratropium, ketorolac , lansoprazole , linzess , multivitamin with minerals, ondansetron , oxycodone , promethazine , promethazine -dextromethorphan, rizatriptan , and triamterene -hydrochlorothiazide , and the following Facility-Administered Medications: fentanyl . Her primarily concern today is the Neck Pain  Initial Vital Signs:  Pulse/HCG Rate: 70ECG Heart Rate: 65 Temp: (!) 97.1 F (36.2 C) Resp: 13 BP: 125/78 SpO2: 99 %  BMI:  Estimated body mass index is 32.92 kg/m as calculated from the following:   Height as of this encounter: 5\' 2"  (1.575 m).   Weight as of this encounter: 180 lb (81.6 kg).  Risk Assessment: Allergies: Reviewed. She is allergic to gluten meal, lactose, ranitidine, sulfa antibiotics, amphetamine-dextroamphetamine, lactose intolerance (gi), reglan [metoclopramide], topiramate  er, wheat, and soap.  Allergy  Precautions: None required Coagulopathies: Reviewed. None identified.  Blood-thinner therapy: None at  this time Active Infection(s): Reviewed. None identified. Elizabeth Bowers is afebrile  Site Confirmation: Elizabeth Bowers was asked to confirm the procedure and laterality before marking the site Procedure checklist: Completed Consent: Before the procedure and under the influence of no sedative(s), amnesic(s), or anxiolytics, the patient was informed of the treatment options, risks and possible complications. To fulfill our ethical and legal obligations, as recommended by the American Medical Association's Code of Ethics, I have informed the patient of my clinical impression; the nature and purpose of the treatment or procedure; the risks, benefits, and possible complications of the intervention; the alternatives, including doing nothing; the risk(s) and benefit(s) of the alternative treatment(s) or procedure(s); and the risk(s) and benefit(s) of doing nothing. The patient was provided information about the general risks and possible complications associated with the procedure. These may include, but are not limited to: failure to achieve desired goals, infection, bleeding, organ or nerve damage, allergic reactions, paralysis, and death. In addition, the patient was informed of those risks and complications associated to Spine-related procedures, such as failure to decrease pain; infection (i.e.: Meningitis, epidural or intraspinal abscess); bleeding (i.e.: epidural hematoma, subarachnoid hemorrhage, or any other  type of intraspinal or peri-dural bleeding); organ or nerve damage (i.e.: Any type of peripheral nerve, nerve root, or spinal cord injury) with subsequent damage to sensory, motor, and/or autonomic systems, resulting in permanent pain, numbness, and/or weakness of one or several areas of the body; allergic reactions; (i.e.: anaphylactic reaction); and/or death. Furthermore, the patient was informed of those risks and complications associated with the medications. These include, but are not limited to: allergic reactions (i.e.: anaphylactic or anaphylactoid reaction(s)); adrenal axis suppression; blood sugar elevation that in diabetics may result in ketoacidosis or comma; water retention that in patients with history of congestive heart failure may result in shortness of breath, pulmonary edema, and decompensation with resultant heart failure; weight gain; swelling or edema; medication-induced neural toxicity; particulate matter embolism and blood vessel occlusion with resultant organ, and/or nervous system infarction; and/or aseptic necrosis of one or more joints. Finally, the patient was informed that Medicine is not an exact science; therefore, there is also the possibility of unforeseen or unpredictable risks and/or possible complications that may result in a catastrophic outcome. The patient indicated having understood very clearly. We have given the patient no guarantees and we have made no promises. Enough time was given to the patient to ask questions, all of which were answered to the patient's satisfaction. Elizabeth Bowers has indicated that she wanted to continue with the procedure. Attestation: I, the ordering provider, attest that I have discussed with the patient the benefits, risks, side-effects, alternatives, likelihood of achieving goals, and potential problems during recovery for the procedure that I have provided informed consent. Date  Time: 10/30/2023 11:17 AM  Pre-Procedure Preparation:   Monitoring: As per clinic protocol. Respiration, ETCO2, SpO2, BP, heart rate and rhythm monitor placed and checked for adequate function Safety Precautions: Patient was assessed for positional comfort and pressure points before starting the procedure. Time-out: I initiated and conducted the "Time-out" before starting the procedure, as per protocol. The patient was asked to participate by confirming the accuracy of the "Time Out" information. Verification of the correct person, site, and procedure were performed and confirmed by me, the nursing staff, and the patient. "Time-out" conducted as per Joint Commission's Universal Protocol (UP.01.01.01). Time: 1245 Start Time: 1245 hrs.  Description  Narrative of Procedure:          Rationale (medical  necessity): procedure needed and proper for the diagnosis and/or treatment of the patient's medical symptoms and needs. Start Time: 1245 hrs. Safety Precautions: Aspiration looking for blood return was conducted prior to all injections. At no point did we inject any substances, as a needle was being advanced. No attempts were made at seeking any paresthesias. Safe injection practices and needle disposal techniques used. Medications properly checked for expiration dates. SDV (single dose vial) medications used. Description of procedure: Protocol guidelines were followed. The patient was assisted into a comfortable position. The target area was identified and the area prepped in the usual manner. Skin & deeper tissues infiltrated with local anesthetic. Appropriate amount of time allowed to pass for local anesthetics to take effect. Using fluoroscopic guidance, the epidural needle was introduced through the skin, ipsilateral to the reported pain, and advanced to the target area. Posterior laminar os was contacted and the needle walked caudad, until the lamina was cleared. The ligamentum flavum was engaged and the epidural space identified using "loss-of-resistance  technique" with 2-3 ml of PF-NaCl (0.9% NSS), in a 5cc dedicated LOR syringe. (See "Imaging guidance" below for use of contrast details.) Once proper needle placement was secured, and negative aspiration confirmed, the solution was injected in intermittent fashion, asking for systemic symptoms every 0.5cc. The needles were then removed and the area cleansed, making sure to leave some of the prepping solution back to take advantage of its long term bactericidal properties.  Vitals:   10/30/23 1255 10/30/23 1258 10/30/23 1309 10/30/23 1319  BP: 120/82 119/84 123/77 121/76  Pulse: 70     Resp: 16 16 16 14   Temp:      SpO2: 96% 96% 100% 99%  Weight:      Height:         End Time: 1255 hrs.  Imaging Guidance (Spinal):          Type of Imaging Technique: Fluoroscopy Guidance (Spinal) Indication(s): Fluoroscopy guidance for needle placement to enhance accuracy in procedures requiring precise needle localization for targeted delivery of medication in or near specific anatomical locations not easily accessible without such real-time imaging assistance. Exposure Time: Please see nurses notes. Contrast: Before injecting any contrast, we confirmed that the patient did not have an allergy  to iodine, shellfish, or radiological contrast. Once satisfactory needle placement was completed at the desired level, radiological contrast was injected. Contrast injected under live fluoroscopy. No contrast complications. See chart for type and volume of contrast used. Fluoroscopic Guidance: I was personally present during the use of fluoroscopy. "Tunnel Vision Technique" used to obtain the best possible view of the target area. Parallax error corrected before commencing the procedure. "Direction-depth-direction" technique used to introduce the needle under continuous pulsed fluoroscopy. Once target was reached, antero-posterior, oblique, and lateral fluoroscopic projection used confirm needle placement in all planes.  Images permanently stored in EMR. Interpretation: I personally interpreted the imaging intraoperatively. Adequate needle placement confirmed in multiple planes. Appropriate spread of contrast into desired area was observed. No evidence of afferent or efferent intravascular uptake. No intrathecal or subarachnoid spread observed. Permanent images saved into the patient's record.  Post-operative Assessment:  Post-procedure Vital Signs:  Pulse/HCG Rate: 7067 Temp: (!) 97.1 F (36.2 C) Resp: 14 BP: 121/76 SpO2: 99 %  EBL: None  Complications: No immediate post-treatment complications observed by team, or reported by patient.  Note: The patient tolerated the entire procedure well. A repeat set of vitals were taken after the procedure and the patient was kept under observation following  institutional policy, for this type of procedure. Post-procedural neurological assessment was performed, showing return to baseline, prior to discharge. The patient was provided with post-procedure discharge instructions, including a section on how to identify potential problems. Should any problems arise concerning this procedure, the patient was given instructions to immediately contact us , at any time, without hesitation. In any case, we plan to contact the patient by telephone for a follow-up status report regarding this interventional procedure.  Comments:  No additional relevant information.  Plan of Care (POC)  Orders:  Orders Placed This Encounter  Procedures   Cervical Epidural Injection    Indication(s): Radiculitis and cervicalgia associated with cervical degenerative disc disease. Position: Prone Imaging guidance: Fluoroscopy required. Contrast required unless contraindicated by allergy  or severe CKD. Equipment & Materials: Epidural tray & needle.    Scheduling Instructions:     Procedure: Cervical Epidural Steroid Injection/Block     Planned Level(s): C7-T1     Laterality: Right-sided      Anxiolysis: Patient's choice.     Timeframe: Today    Where will this procedure be performed?:   [facility]             by Dr. Lorin Room [facility] C-[facility] 1-60 MIN NO REPORT    Intraoperative interpretation by procedural physician at [facility].    Standing Status:   Standing    Number of Occurrences:   1    Reason for exam::   Assistance in needle guidance and placement for procedures requiring needle placement in or near specific anatomical locations not easily accessible without such assistance.   Informed Consent Details: Physician/Practitioner Attestation; Transcribe to consent form and obtain patient signature    Nursing instructions: Transcribe to consent form and obtain patient signature. Always confirm laterality of pain with Elizabeth Bowers, before procedure.    Physician/Practitioner attestation of informed consent for procedure/surgical case:   I, the physician/practitioner, attest that I have discussed with the patient the benefits, risks, side effects, alternatives, likelihood of achieving goals and potential problems during recovery for the procedure that I have provided informed consent.    Procedure:   Cervical Epidural Steroid Injection (CESI) under fluoroscopic guidance    Physician/Practitioner performing the procedure:   Raquon Milledge A. Barth Borne MD    Indication/Reason:   Indications: Cervicalgia (neck pain), cervical radicular pain, radiculitis (arm/shoulder pain, numbness, and/or weakness), degenerative disc disease, severe enough to greatly impact quality of life or function.   Provide equipment / supplies at bedside    Procedural tray: Epidural Tray (Disposable  single use) Skin infiltration needle: Regular 1.5-in, 25-G, (x1) Block needle size: Regular standard Catheter: No catheter required    Standing Status:   Standing    Number of Occurrences:   1    Specify:   Epidural Tray   Saline lock IV    Have LR [id number] mL available and administer at 125  mL/hr if patient becomes hypotensive.    Standing Status:   Standing    Number of Occurrences:   1    Chronic Opioid Analgesic: No chronic opioid analgesics therapy prescribed by our practice.   Medications ordered for procedure: Meds ordered this encounter  Medications   iohexol  (OMNIPAQUE ) 180 MG/ML injection 10 mL    Must be Myelogram-compatible. If not available, you may substitute with a water-soluble, non-ionic, hypoallergenic, myelogram-compatible radiological contrast medium.   lidocaine  (XYLOCAINE ) 2 % (with pres) injection 400 mg   pentafluoroprop-tetrafluoroeth (GEBAUERS) aerosol   midazolam  (VERSED )  5 MG/5ML injection 0.5-2 mg    Make sure Flumazenil is available in the pyxis when using this medication. If oversedation occurs, administer 0.2 mg IV over 15 sec. If after 45 sec no response, administer 0.2 mg again over 1 min; may repeat at 1 min intervals; not to exceed 4 doses (1 mg)   fentaNYL  (SUBLIMAZE ) injection 25-50 mcg    Make sure Narcan is available in the pyxis when using this medication. In the event of respiratory depression (RR< 8/min): Titrate NARCAN (naloxone) in increments of 0.1 to 0.2 mg IV at 2-3 minute intervals, until desired degree of reversal.   sodium chloride  flush (NS) 0.9 % injection 1 mL   ropivacaine  (PF) 2 mg/mL (0.2%) (NAROPIN ) injection 1 mL   dexamethasone  (DECADRON ) injection 10 mg   Medications administered: We administered iohexol , lidocaine , pentafluoroprop-tetrafluoroeth, midazolam , fentaNYL , sodium chloride  flush, ropivacaine  (PF) 2 mg/mL (0.2%), and dexamethasone .  See the medical record for exact dosing, route, and time of administration.  Follow-up plan:   Return in about 2 weeks (around 11/13/2023) for (Face2F), (PPE).       Interventional Therapies  Risk  Complexity Considerations:   Anxiety/Depression  GERD  HTN  IBS  Stage 3 CKD     Planned  Pending:      Under consideration:   Palliative/therapeutic right cervical  ESI #5    Completed:   Therapeutic bilateral C2-3 and C3-4 cervical facet MBB by Dr. Rhesa Celeste (pain worsened)  Diagnostic/therapeutic right L3-4 LESI x1 (09/08/2021) (100/100/100/100)  Palliative bilateral cervical facet MBB x2 (06/13/2018) (100/100/0/<25)  Diagnostic bilateral lumbar facet MBB x2 (08/08/2018) [facility])  Diagnostic right Cervical ESI x4 (02/01/2023) (100/50/100/100)  Therapeutic right cervical facet RFA x1 (07/23/2018) (100/50/0) (No benefit)  (10/24/2023) referral to neurosurgery for cervical spine evaluation (Dr. Jeris Montes) (scheduled for 11/01/2023)    Completed by other providers:   Therapeutic sphenopalatine ganglion Blk by Dr. Bernetta Brilliant x2 (03/04/2018) Diagnostic bilateral upper extremity EMG/PNCV by Dr. Bernetta Brilliant: Right Grade II & Left Grade I CTS (08/23/2018)    Therapeutic  Palliative (PRN) options:   Therapeutic/palliative cervical facet MBB   Therapeutic/palliativelumbar facet MBB   Therapeutic/palliative Cervical ESI   Therapeutic/palliative cervical facet RFA        Recent Visits Date Type Provider Dept  10/01/23 Office Visit Renaldo Caroli, MD [facility]  09/26/23 Procedure visit Cephus Collin, MD [facility]  09/19/23 Office Visit Renaldo Caroli, MD [facility]  Showing recent visits within past 90 days and meeting all other requirements Today's Visits Date Type Provider Dept  10/30/23 Procedure visit Renaldo Caroli, MD [facility]  Showing today's visits and meeting all other requirements Future Appointments Date Type Provider Dept  12/03/23 Appointment Renaldo Caroli, MD [facility]  Showing future appointments within next 90 days and meeting all other requirements  Disposition: Discharge home  Discharge (Date  Time): 10/30/2023; 1320 hrs.   Primary Care Physician: Mazie Speed, MD Location: [facility] Note by: Candi Chafe, MD (TTS technology  used. I apologize for any typographical errors that were not detected and corrected.) Date: 10/30/2023; Time: 1:38 PM  Disclaimer:  Medicine is not an Visual merchandiser. The only guarantee in medicine is that nothing is guaranteed. It is important to note that the decision to proceed with this intervention was based on the information collected from the patient. The Data and conclusions were drawn from the patient's questionnaire, the interview, and the physical examination. Because the information was provided  in large part by the patient, it cannot be guaranteed that it has not been purposely or unconsciously manipulated. Every effort has been made to obtain as much relevant data as possible for this evaluation. It is important to note that the conclusions that lead to this procedure are derived in large part from the available data. Always take into account that the treatment will also be dependent on availability of resources and existing treatment guidelines, considered by other Pain Management Practitioners as being common knowledge and practice, at the time of the intervention. For Medico-Legal purposes, it is also important to point out that variation in procedural techniques and pharmacological choices are the acceptable norm. The indications, contraindications, technique, and results of the above procedure should only be interpreted and judged by a Board-Certified Interventional Pain Specialist with extensive familiarity and expertise in the same exact procedure and technique.

## 2023-10-30 NOTE — Patient Instructions (Addendum)
 Moderate Conscious Sedation, Adult, Care After After the procedure, it is common to have: Sleepiness for a few hours. Impaired judgment for a few hours. Trouble with balance. Nausea or vomiting if you eat too soon. Follow these instructions at home: For the time period you were told by your health care provider:  Rest. Do not participate in activities where you could fall or become injured. Do not drive or use machinery. Do not drink alcohol. Do not take sleeping pills or medicines that cause drowsiness. Do not make important decisions or sign legal documents. Do not take care of children on your own. Eating and drinking Follow instructions from your health care provider about what you may eat and drink. Drink enough fluid to keep your urine pale yellow. If you vomit: Drink clear fluids slowly and in small amounts as you are able. Clear fluids include water, ice chips, low-calorie sports drinks, and fruit juice that has water added to it (diluted fruit juice). Eat light and bland foods in small amounts as you are able. These foods include bananas, applesauce, rice, lean meats, toast, and crackers. General instructions Take over-the-counter and prescription medicines only as told by your health care provider. Have a responsible adult stay with you for the time you are told. Do not use any products that contain nicotine or tobacco. These products include cigarettes, chewing tobacco, and vaping devices, such as e-cigarettes. If you need help quitting, ask your health care provider. Return to your normal activities as told by your health care provider. Ask your health care provider what activities are safe for you. Your health care provider may give you more instructions. Make sure you know what you can and cannot do. Contact a health care provider if: You are still sleepy or having trouble with balance after 24 hours. You feel light-headed. You vomit every time you eat or drink. You get  a rash. You have a fever. You have redness or swelling around the IV site. Get help right away if: You have trouble breathing. You start to feel confused at home. These symptoms may be an emergency. Get help right away. Call 911. Do not wait to see if the symptoms will go away. Do not drive yourself to the hospital. This information is not intended to replace advice given to you by your health care provider. Make sure you discuss any questions you have with your health care provider. Document Revised: 12/05/2021 Document Reviewed: 12/05/2021 Elsevier Patient Education  2024 [employer]. ______________________________________________________________________    Post-Procedure Discharge Instructions  Instructions: Apply ice:  Purpose: This will minimize any swelling and discomfort after procedure.  When: Day of procedure, as soon as you get home. How: Fill a plastic sandwich bag with crushed ice. Cover it with a small towel and apply to injection site. How long: (15 min on, 15 min off) Apply for 15 minutes then remove x 15 minutes.  Repeat sequence on day of procedure, until you go to bed. Apply heat:  Purpose: To treat any soreness and discomfort from the procedure. When: Starting the next day after the procedure. How: Apply heat to procedure site starting the day following the procedure. How long: May continue to repeat daily, until discomfort goes away. Food intake: Start with clear liquids (like water) and advance to regular food, as tolerated.  Physical activities: Keep activities to a minimum for the first 8 hours after the procedure. After that, then as tolerated. Driving: If you have received any sedation, be responsible and do  not drive. You are not allowed to drive for 24 hours after having sedation. Blood thinner: (Applies only to those taking blood thinners) You may restart your blood thinner 6 hours after your procedure. Insulin: (Applies only to Diabetic patients taking  insulin) As soon as you can eat, you may resume your normal dosing schedule. Infection prevention: Keep procedure site clean and dry. Shower daily and clean area with soap and water. Post-procedure Pain Diary: Extremely important that this be done correctly and accurately. Recorded information will be used to determine the next step in treatment. For the purpose of accuracy, follow these rules: Evaluate only the area treated. Do not report or include pain from an untreated area. For the purpose of this evaluation, ignore all other areas of pain, except for the treated area. After your procedure, avoid taking a long nap and attempting to complete the pain diary after you wake up. Instead, set your alarm clock to go off every hour, on the hour, for the initial 8 hours after the procedure. Document the duration of the numbing medicine, and the relief you are getting from it. Do not go to sleep and attempt to complete it later. It will not be accurate. If you received sedation, it is likely that you were given a medication that may cause amnesia. Because of this, completing the diary at a later time may cause the information to be inaccurate. This information is needed to plan your care. Follow-up appointment: Keep your post-procedure follow-up evaluation appointment after the procedure (usually 2 weeks for most procedures, 6 weeks for radiofrequencies). DO NOT FORGET to bring you pain diary with you.   Expect: (What should I expect to see with my procedure?) From numbing medicine (AKA: Local Anesthetics): Numbness or decrease in pain. You may also experience some weakness, which if present, could last for the duration of the local anesthetic. Onset: Full effect within 15 minutes of injected. Duration: It will depend on the type of local anesthetic used. On the average, 1 to 8 hours.  From steroids (Applies only if steroids were used): Decrease in swelling or inflammation. Once inflammation is improved,  relief of the pain will follow. Onset of benefits: Depends on the amount of swelling present. The more swelling, the longer it will take for the benefits to be seen. In some cases, up to 10 days. Duration: Steroids will stay in the system x 2 weeks. Duration of benefits will depend on multiple posibilities including persistent irritating factors. Side-effects: If present, they may typically last 2 weeks (the duration of the steroids). Frequent: Cramps (if they occur, drink Gatorade and take over-the-counter Magnesium 450-500 mg once to twice a day); water retention with temporary weight gain; increases in blood sugar; decreased immune system response; increased appetite. Occasional: Facial flushing (red, warm cheeks); mood swings; menstrual changes. Uncommon: Long-term decrease or suppression of natural hormones; bone thinning. (These are more common with higher doses or more frequent use. This is why we prefer that our patients avoid having any injection therapies in other practices.)  Very Rare: Severe mood changes; psychosis; aseptic necrosis. From procedure: Some discomfort is to be expected once the numbing medicine wears off. This should be minimal if ice and heat are applied as instructed.  Call if: (When should I call?) You experience numbness and weakness that gets worse with time, as opposed to wearing off. New onset bowel or bladder incontinence. (Applies only to procedures done in the spine)  Emergency Numbers: Durning business hours (Monday -  Thursday, 8:00 AM - 4:00 PM) (Friday, 9:00 AM - 12:00 Noon): [phone] After hours: [phone] NOTE: If you are having a problem and are unable connect with, or to talk to a provider, then go to your nearest urgent care or emergency department. If the problem is serious and urgent, please call 911. ______________________________________________________________________

## 2023-10-30 NOTE — Progress Notes (Signed)
 Safety precautions to be maintained throughout the outpatient stay will include: orient to surroundings, keep bed in low position, maintain call bell within reach at all times, provide assistance with transfer out of bed and ambulation.

## 2023-10-31 ENCOUNTER — Telehealth: Payer: Self-pay

## 2023-10-31 ENCOUNTER — Telehealth: Payer: Self-pay | Admitting: Pain Medicine

## 2023-10-31 ENCOUNTER — Other Ambulatory Visit: Payer: Self-pay | Admitting: Pain Medicine

## 2023-10-31 DIAGNOSIS — Z789 Other specified health status: Secondary | ICD-10-CM

## 2023-10-31 DIAGNOSIS — M542 Cervicalgia: Secondary | ICD-10-CM

## 2023-10-31 DIAGNOSIS — G4486 Cervicogenic headache: Secondary | ICD-10-CM

## 2023-10-31 NOTE — Telephone Encounter (Signed)
 This one Dena

## 2023-10-31 NOTE — Telephone Encounter (Signed)
 Needs a note taking her out of work until the 30th. Or she will be fired from her job. She will need to come pick it up please call her .

## 2023-10-31 NOTE — Telephone Encounter (Signed)
 Patient stating that she needs documentation with a return to work date. She states that her employer needs an exact date, not Unknown. She is worried about losing her job.

## 2023-10-31 NOTE — Telephone Encounter (Signed)
 Work note written for 10/31/23-11/14/23

## 2023-10-31 NOTE — Progress Notes (Unsigned)
 Referring Physician:  Renaldo Caroli, MD [address] [place],  [place] [address]  Primary Physician:  Mazie Speed, MD  History of Present Illness: 11/01/2023 [name] is here today with a chief complaint of chronic neck and arm pain who presents for evaluation of worsening symptoms. She was referred by Dr. Naveira for evaluation of neck problems potentially contributing to headaches.  Neck pain radiates down the right arm, affecting the entire hand, including all five fingers, for approximately two months since March 2025. The pain is accompanied by weakness in the arm, leading to dropping objects, and pain in the right shoulder blade. Ice packs are used for pain management, which worsens with movement.  A history of falls includes one in December 2024 while doing laundry, injuring her shoulder blade and neck, and another in January 2025 where she slipped on a dog's bed, injuring her left arm. Symptoms worsened in March 2025, coinciding with the onset of severe migraines.  Diagnostic studies include a CT scan of the shoulder and an MRI of the cervical spine. Previous treatments, including injections and pain medications like Toradol  and migraine cocktails, have been ineffective. Severe pain sometimes leads to vomiting and requires her to remain still, unable to lie on her right side due to pain.  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 She wants to return to work.  Neck pain that radiates to the right shoulder and headaches.  She has difficulty with range of motion of her right arm.  Bowel/Bladder Dysfunction: none  Conservative measures:  Physical therapy: has not participated in PT  Multimodal medical therapy including regular antiinflammatories: Oxycodone , Toradol   Injections: 10/30/2023 Right C7-T1 ESI 09/26/2023 Bilateral C3, C4  MBB 01/11/2023 Right C7-T1 ESI 05/11/2022 Right C7-T1 ESI 09/08/2021 Right L3-4 ESI   Past Surgery: no   Elizabeth Bowers has no symptoms of cervical myelopathy.  The symptoms are causing a significant impact on the patient's life.   I have utilized the care everywhere function in epic to review the outside records available from external health systems.  Review of Systems:  A 10 point review of systems is negative, except for the pertinent positives and negatives detailed in the HPI.  Past Medical History: Past Medical History:  Diagnosis Date   Fatty liver    Fibromyalgia    GERD (gastroesophageal reflux disease)    Hemorrhoids    Hot flashes    Hypertension    IBS (irritable bowel syndrome)    LUQ abdominal pain 10/25/2016   Migraines    Miscarriage    x 2; 4 live births    Multilevel degenerative disc disease    Neuroma    Vocal cord dysfunction     Past Surgical History: Past Surgical History:  Procedure Laterality Date   ABDOMINAL HYSTERECTOMY     2012 removed cervix h/o abnormal pap    CHOLECYSTECTOMY     2000   COLONOSCOPY     2018 with + polpys and GIB 2/2 polyp removal    COLONOSCOPY WITH PROPOFOL  N/A 12/22/2019   Procedure: COLONOSCOPY WITH PROPOFOL ;  Surgeon: Selena Daily, MD;  Location: [facility] ENDOSCOPY;  Service: Gastroenterology;  Laterality: N/A;   COLONOSCOPY WITH PROPOFOL  N/A 12/23/2019   Procedure: COLONOSCOPY WITH PROPOFOL ;  Surgeon: Selena Daily, MD;  Location: [facility] ENDOSCOPY;  Service: Gastroenterology;  Laterality: N/A;  Last  name pronounced LEE-MA   COLONOSCOPY WITH PROPOFOL  N/A 01/01/2023   Procedure: COLONOSCOPY WITH PROPOFOL ;  Surgeon: Selena Daily, MD;  Location: [facility] ENDOSCOPY;  Service: Gastroenterology;  Laterality: N/A;   ELBOW DEBRIDEMENT     ESOPHAGOGASTRODUODENOSCOPY (EGD) WITH PROPOFOL  N/A 02/06/2018   Procedure: ESOPHAGOGASTRODUODENOSCOPY (EGD) WITH PROPOFOL  with biopsies;  Surgeon: Selena Daily, MD;  Location:  [facility]ERY [facility];  Service: Endoscopy;  Laterality: N/A;   ETHMOIDECTOMY N/A 09/28/2021   Procedure: TOTAL ETHMOIDECTOMY;  Surgeon: Rogers Clayman, MD;  Location: [facility];  Service: ENT;  Laterality: N/A;   FOOT SURGERY     IMAGE GUIDED SINUS SURGERY N/A 09/28/2021   Procedure: IMAGE GUIDED SINUS SURGERY;  Surgeon: Rogers Clayman, MD;  Location: [facility] SUR[facility]ERY [facility];  Service: ENT;  Laterality: N/A;  PLACED DISK ON OR CHARGE NURSE DESK 4-21  KP   MAXILLARY ANTROSTOMY Bilateral 09/28/2021   Procedure: MAXILLARY ANTROSTOMY WITHOUT TISSUE REMOVAL;  Surgeon: Rogers Clayman, MD;  Location: [facility] SUR[facility]ERY [facility];  Service: ENT;  Laterality: Bilateral;   POLYPECTOMY  01/01/2023   Procedure: POLYPECTOMY;  Surgeon: Selena Daily, MD;  Location: [facility] ENDOSCOPY;  Service: Gastroenterology;;   Rupert Counts Bilateral 09/28/2021   Procedure: SPHENOIDOTOMY;  Surgeon: Rogers Clayman, MD;  Location: [facility];  Service: ENT;  Laterality: Bilateral;    Allergies: Allergies as of 11/01/2023 - Review Complete 10/30/2023  Allergen Reaction Noted   Gluten meal  04/22/2019   Lactose  04/22/2019   Ranitidine Diarrhea and Other (See Comments) 08/30/2016   Sulfa antibiotics Anxiety 12/15/2016   Amphetamine-dextroamphetamine Other (See Comments) 05/10/2016   Lactose intolerance (gi)  02/05/2018   Reglan [metoclopramide] Other (See Comments) 12/15/2016   Topiramate  er Other (See Comments) 01/16/2018   Wheat  02/05/2018   Soap Rash 02/20/2017    Medications:  Current Outpatient Medications:    amLODipine  (NORVASC ) 10 MG tablet, Take 1 tablet (10 mg) by mouth daily., Disp: 90 tablet, Rfl: 0   aspirin-acetaminophen -caffeine  (EXCEDRIN  MIGRAINE) 250-250-65 MG tablet, Take 1 tablet by mouth every 6 (six) hours as needed for headache., Disp: 30 tablet, Rfl: 0   butalbital -acetaminophen -caffeine  (FIORICET ) 50-325-40 MG tablet, Take 1-2 tablets by mouth every 6 (six) hours as needed  for headache., Disp: 20 tablet, Rfl: 0   hydrOXYzine  (ATARAX ) 10 MG tablet, Take 1 tablet (10 mg total) by mouth 3 (three) times daily as needed., Disp: 60 tablet, Rfl: 1   ipratropium (ATROVENT ) 0.06 % nasal spray, Place 2 sprays into both nostrils 4 (four) times daily., Disp: 15 mL, Rfl: 12   ketorolac  (TORADOL ) 10 MG tablet, Take 1 tablet (10 mg total) by mouth every 6 (six) hours as needed., Disp: 20 tablet, Rfl: 0   lansoprazole  (PREVACID ) 30 MG capsule, Take 1 capsule (30 mg total) by mouth 2 (two) times daily before a meal., Disp: 180 capsule, Rfl: 1   LINZESS  290 MCG CAPS capsule, Take 1 capsule (290 mcg total) by mouth daily., Disp: 90 capsule, Rfl: 0   Multiple Vitamin (MULTIVITAMIN WITH MINERALS) TABS tablet, Take 1 tablet by mouth daily., Disp: , Rfl:    ondansetron  (ZOFRAN -ODT) 4 MG disintegrating tablet, Take 1 tablet (4 mg total) by mouth every 8 (eight) hours as needed for nausea or vomiting., Disp: 20 tablet, Rfl: 0   oxyCODONE  (OXY IR/ROXICODONE ) 5 MG immediate release tablet, Take 1 tablet (5 mg total) by mouth every 8 (eight) hours as needed for up to 14 days for severe pain (pain score 7-10). Must last 30 days., Disp:  42 tablet, Rfl: 0   promethazine  (PHENERGAN ) 25 MG tablet, Take 1 tablet (25 mg total) by mouth every 6 (six) hours as needed for nausea or vomiting., Disp: 20 tablet, Rfl: 0   promethazine -dextromethorphan (PROMETHAZINE -DM) 6.25-15 MG/5ML syrup, Take 5 mLs by mouth 4 (four) times daily as needed., Disp: 118 mL, Rfl: 0   rizatriptan  (MAXALT -MLT) 5 MG disintegrating tablet, Dissolve 1 tablet (5 mg total) in the mouth as directed for Migraine. May take a second dose after 2 hours if needed., Disp: 6 tablet, Rfl: 1   triamterene -hydrochlorothiazide  (MAXZIDE -25) 37.5-25 MG tablet, Take 1 tablet by mouth daily., Disp: 90 tablet, Rfl: 1  Social History: Social History   Tobacco Use   Smoking status: Former    Current packs/day: 0.00    Average packs/day: 0.8 packs/day  for 5.0 years (3.8 ttl pk-yrs)    Types: Cigarettes    Start date: 07/05/1982    Quit date: 07/06/1987    Years since quitting: 36.3    Passive exposure: Never   Smokeless tobacco: Never  Vaping Use   Vaping status: Never Used  Substance Use Topics   Alcohol use: Yes    Alcohol/week: 2.0 - 4.0 standard drinks of alcohol    Types: 2 - 4 Cans of beer per week    Comment: wine cooler   Drug use: Never    Family Medical History: Family History  Problem Relation Age of Onset   Hypertension Mother    Hyperlipidemia Mother    Glaucoma Mother    Diabetes Mother        type 2    COPD Father        emphysema   Alcohol abuse Father    Colon cancer Sister    Cancer Sister        rectal/colon cancer no h/o IBD   Colon polyps Sister    Asthma Sister    COPD Sister    Kidney disease Sister    Mental illness Sister        bipolar    Miscarriages / [place] Sister    Uterine cancer Sister    Alcohol abuse Son    Depression Son    Diabetes Son    Drug abuse Son    Mental illness Son        bipolar    Diabetes Maternal Grandmother    COPD Paternal Grandmother    Cancer Paternal Grandfather        FH colon cancer maternal aunts/uncles    Miscarriages / Stillbirths Sister    Arthritis Son    Arthritis Son    Breast cancer Neg Hx     Physical Examination: Vitals:   11/01/23 0908  BP: 112/78    General: Patient is tearful. Attention to examination is appropriate.  Neck:   Supple.  Full range of motion.  Respiratory: Patient is breathing without any difficulty.   NEUROLOGICAL:     Awake, alert, oriented to person, place, and time.  Speech is clear and fluent.   Cranial Nerves: Pupils equal round and reactive to light.  Facial tone is symmetric.  Facial sensation is symmetric. Shoulder shrug is symmetric. Tongue protrusion is midline.  There is no pronator drift.  Strength: Side Biceps Triceps Deltoid Interossei Grip Wrist Ext. Wrist Flex.  R 5 5 5 5 5 5 5   L 5 5  5 5 5 5 5    Side Iliopsoas Quads Hamstring PF DF EHL  R 5 5 5 5  5  5  L 5 5 5 5 5 5    Reflexes are 1+ and symmetric at the biceps, triceps, brachioradialis, patella and achilles.   Hoffman's is absent.   Bilateral upper and lower extremity sensation is intact to light touch.    No evidence of dysmetria noted.  Gait is normal.    +TTP R shoulder and pain with active and passive ROM  Medical Decision Making  Imaging: MRI 10/17/2023 Disc levels:   C2-3: Facet spurring asymmetric to the right where there is ankylosis. No neural impingement   C3-4: Disc narrowing and bulging with uncovertebral ridging. Mild degenerative facet spurring. Moderate right foraminal narrowing which is progressed.   C4-5: Mild disc height loss with ventral endplate spurring. Degenerative facet spurring greater on the left. Patent canal and foramina   C5-6: Disc narrowing and bulging with endplate and uncovertebral ridging. There is a central disc protrusion. Bilateral foraminal impingement.   C6-7: Disc narrowing and bulging with endplate ridging. Mild right and moderate left foraminal narrowing.   C7-T1:Unremarkable.   IMPRESSION: Diffuse cervical spine degeneration with generalized progression since 2020.   Foraminal impingement bilaterally at C5-6 and on the left at C6-7.   Diffusely patent spinal canal.     Electronically Signed   By: Ronnette Coke M.D.   On: 10/24/2023 09:42    I have personally reviewed the images and agree with the above interpretation.  Assessment and Plan: Elizabeth Bowers is a pleasant 58 y.o. female with multiple issues.  Her migraine headache is causing her severe difficulty.  She also has the following issues that we will address.  Cervical radiculopathy Chronic cervical radiculopathy with nerve root compression at multiple levels, causing pain and weakness in the right arm and hand. Possible concurrent shoulder pathology due to pain on movement and history of  falls. - Order physical therapy for neck and shoulder exercises. - Order MRI of the shoulder to evaluate for potential shoulder pathology. - Refer to orthopedic evaluation for shoulder assessment and possible injection. - Coordinate with physical therapy to address both neck and shoulder issues.  Shoulder pain Chronic right shoulder pain, possibly due to previous falls and bursitis, with potential overlap with cervical radiculopathy symptoms. - Order MRI of the shoulder to evaluate for potential shoulder pathology. - Refer to orthopedic evaluation for shoulder assessment and possible injection.  Migraine Chronic migraines with severe pain. Previous treatments ineffective.  - Contact Dr. Mason Sole to expedite neurology appointment for migraine management. - Provide work note to extend leave until after the next appointment.  Fibromyalgia Fibromyalgia contributing to overall pain sensitivity and complicating the clinical picture.  Follow-up Follow-up plans discussed to address ongoing symptoms and treatment efficacy. She is eager to return to work and seeks expedited care. - Schedule follow-up appointment in a few weeks to assess progress with physical therapy, MRI results, and orthopedic evaluation. - Coordinate with Dr. Mason Sole for earlier neurology appointment if possible.    Thank you for involving me in the care of this patient.      Viggo Perko K. Mont Antis MD, [facility] Neurosurgery

## 2023-11-01 ENCOUNTER — Encounter: Payer: Self-pay | Admitting: Family Medicine

## 2023-11-01 ENCOUNTER — Ambulatory Visit (INDEPENDENT_AMBULATORY_CARE_PROVIDER_SITE_OTHER): Admitting: Neurosurgery

## 2023-11-01 VITALS — BP 112/78 | Ht 62.0 in | Wt 184.0 lb

## 2023-11-01 DIAGNOSIS — M5412 Radiculopathy, cervical region: Secondary | ICD-10-CM | POA: Diagnosis not present

## 2023-11-01 DIAGNOSIS — G8929 Other chronic pain: Secondary | ICD-10-CM

## 2023-11-01 DIAGNOSIS — G43819 Other migraine, intractable, without status migrainosus: Secondary | ICD-10-CM

## 2023-11-01 DIAGNOSIS — M25511 Pain in right shoulder: Secondary | ICD-10-CM | POA: Diagnosis not present

## 2023-11-02 ENCOUNTER — Ambulatory Visit
Admission: RE | Admit: 2023-11-02 | Discharge: 2023-11-02 | Disposition: A | Discharge status: Home / Self Care | Source: Ambulatory Visit | Attending: Neurosurgery | Admitting: Neurosurgery

## 2023-11-02 DIAGNOSIS — M25511 Pain in right shoulder: Secondary | ICD-10-CM | POA: Insufficient documentation

## 2023-11-02 DIAGNOSIS — M19011 Primary osteoarthritis, right shoulder: Secondary | ICD-10-CM | POA: Diagnosis not present

## 2023-11-02 DIAGNOSIS — M7581 Other shoulder lesions, right shoulder: Secondary | ICD-10-CM | POA: Diagnosis not present

## 2023-11-02 DIAGNOSIS — G8929 Other chronic pain: Secondary | ICD-10-CM | POA: Insufficient documentation

## 2023-11-05 ENCOUNTER — Other Ambulatory Visit: Payer: Self-pay | Admitting: Family Medicine

## 2023-11-05 ENCOUNTER — Other Ambulatory Visit: Payer: Self-pay

## 2023-11-05 ENCOUNTER — Other Ambulatory Visit: Payer: Self-pay | Admitting: Pain Medicine

## 2023-11-05 ENCOUNTER — Other Ambulatory Visit (HOSPITAL_COMMUNITY): Payer: Self-pay

## 2023-11-05 DIAGNOSIS — G4486 Cervicogenic headache: Secondary | ICD-10-CM

## 2023-11-05 DIAGNOSIS — M5030 Other cervical disc degeneration, unspecified cervical region: Secondary | ICD-10-CM

## 2023-11-05 DIAGNOSIS — G8929 Other chronic pain: Secondary | ICD-10-CM

## 2023-11-05 DIAGNOSIS — M542 Cervicalgia: Secondary | ICD-10-CM

## 2023-11-05 DIAGNOSIS — R937 Abnormal findings on diagnostic imaging of other parts of musculoskeletal system: Secondary | ICD-10-CM

## 2023-11-05 DIAGNOSIS — M5481 Occipital neuralgia: Secondary | ICD-10-CM

## 2023-11-05 DIAGNOSIS — K219 Gastro-esophageal reflux disease without esophagitis: Secondary | ICD-10-CM

## 2023-11-05 MED ORDER — LANSOPRAZOLE 30 MG PO CPDR
30.0000 mg | DELAYED_RELEASE_CAPSULE | Freq: Two times a day (BID) | ORAL | 1 refills | Status: DC
  Filled 2023-11-05: qty 180, 90d supply, fill #0
  Filled 2024-02-05 – 2024-02-21 (×2): qty 180, 90d supply, fill #1

## 2023-11-06 ENCOUNTER — Other Ambulatory Visit: Payer: Self-pay

## 2023-11-06 ENCOUNTER — Other Ambulatory Visit (HOSPITAL_COMMUNITY): Payer: Self-pay

## 2023-11-06 ENCOUNTER — Encounter: Payer: Self-pay | Admitting: Family Medicine

## 2023-11-06 MED ORDER — RIZATRIPTAN BENZOATE 5 MG PO TBDP
5.0000 mg | ORAL_TABLET | ORAL | 1 refills | Status: DC
  Filled 2023-11-06: qty 6, 15d supply, fill #0
  Filled 2023-12-23: qty 6, 15d supply, fill #1

## 2023-11-07 ENCOUNTER — Other Ambulatory Visit: Payer: Self-pay

## 2023-11-07 ENCOUNTER — Encounter: Payer: Self-pay | Admitting: Pain Medicine

## 2023-11-08 ENCOUNTER — Ambulatory Visit: Attending: Nurse Practitioner | Admitting: Nurse Practitioner

## 2023-11-08 ENCOUNTER — Other Ambulatory Visit: Payer: Self-pay

## 2023-11-08 ENCOUNTER — Encounter: Payer: Self-pay | Admitting: Nurse Practitioner

## 2023-11-08 VITALS — BP 116/82 | HR 76 | Temp 99.0°F | Resp 16 | Ht 62.0 in | Wt 180.0 lb

## 2023-11-08 DIAGNOSIS — M79601 Pain in right arm: Secondary | ICD-10-CM | POA: Diagnosis not present

## 2023-11-08 DIAGNOSIS — M79602 Pain in left arm: Secondary | ICD-10-CM | POA: Diagnosis not present

## 2023-11-08 DIAGNOSIS — M542 Cervicalgia: Secondary | ICD-10-CM | POA: Diagnosis not present

## 2023-11-08 DIAGNOSIS — M5412 Radiculopathy, cervical region: Secondary | ICD-10-CM | POA: Insufficient documentation

## 2023-11-08 DIAGNOSIS — M4310 Spondylolisthesis, site unspecified: Secondary | ICD-10-CM | POA: Diagnosis not present

## 2023-11-08 DIAGNOSIS — G4486 Cervicogenic headache: Secondary | ICD-10-CM | POA: Insufficient documentation

## 2023-11-08 DIAGNOSIS — M4692 Unspecified inflammatory spondylopathy, cervical region: Secondary | ICD-10-CM | POA: Insufficient documentation

## 2023-11-08 DIAGNOSIS — M4312 Spondylolisthesis, cervical region: Secondary | ICD-10-CM

## 2023-11-08 DIAGNOSIS — Z789 Other specified health status: Secondary | ICD-10-CM | POA: Insufficient documentation

## 2023-11-08 DIAGNOSIS — G894 Chronic pain syndrome: Secondary | ICD-10-CM | POA: Diagnosis not present

## 2023-11-08 DIAGNOSIS — G8929 Other chronic pain: Secondary | ICD-10-CM | POA: Diagnosis not present

## 2023-11-08 MED ORDER — OXYCODONE HCL 5 MG PO TABS
5.0000 mg | ORAL_TABLET | Freq: Three times a day (TID) | ORAL | 0 refills | Status: DC | PRN
  Filled 2023-11-08: qty 42, 14d supply, fill #0

## 2023-11-08 NOTE — Progress Notes (Signed)
 PROVIDER NOTE: Interpretation of information contained herein should be left to medically-trained personnel. Specific patient instructions are provided elsewhere under "Patient Instructions" section of medical record. This document was created in part using AI and STT-dictation technology, any transcriptional errors that may result from this process are unintentional.  Patient: Elizabeth Bowers  Service: E/M   PCP: Mazie Speed, MD  DOB: 1966/05/06  DOS: 11/08/2023  Provider: Cherylin Corrigan, NP  MRN: [id number]  Delivery: Face-to-face  Specialty: Interventional Pain Management  Type: Established Patient  Setting: Ambulatory outpatient facility  Specialty designation: 09  Referring Prov.: David Escort Stan Eans, MD  Location: Outpatient office facility       History of present illness (HPI) Elizabeth Bowers, a 58 y.o. year old female, is here today because of her Encounter for chronic pain management [G89.29]. Elizabeth Bowers primary complain today is Headache and Shoulder Pain (right)   Pain Assessment: Severity of Chronic pain is reported as a 7 /10. Location: Head (migraines)  / . Onset: More than a month ago. Quality: Constant, Stabbing, Pressure. Timing: Constant. Modifying factor(s): meds, dark glasses. Vitals:  height is 5\' 2"  (1.575 m) and weight is 180 lb (81.6 kg). Her temperature is 99 F (37.2 C). Her blood pressure is 116/82 and her pulse is 76. Her respiration is 16 and oxygen saturation is 98%.  BMI: Estimated body mass index is 32.92 kg/m as calculated from the following:   Height as of this encounter: 5\' 2"  (1.575 m).   Weight as of this encounter: 180 lb (81.6 kg).  Last encounter: 10/01/2203 Last procedure: 10/30/2023  Reason for encounter: post-procedure evaluation and assessment.  The patient presented for evaluation of worsening symptoms, including headache and right shoulder pain.  Elizabeth Bowers underwent a therapeutic Cervical Epidural Steroid injection (CESI) on Oct 30, 2023.  She reports no relief or improvement in pain symptoms since the procedure.  Procedure Procedure: Cervical Epidural Steroid injection (CESI) (Interlaminar) #4  Laterality: Right  Level: C7-T1 Imaging: Fluoroscopy-assisted DOS: 10/30/2023  Performed by: Renaldo Caroli, MD Anesthesia: Local anesthesia (1-2% Lidocaine ) Anxiolysis: IV Versed  2.0 mg Sedation: Moderate Sedation Fentanyl  1 mL (50 mcg)    Purpose: Diagnostic/Therapeutic Indications: Cervicalgia, cervical radicular pain, degenerative disc disease, severe enough to impact quality of life or function. 1. Cervicalgia (Bilateral) (R>L)   2. Cervicogenic headache   3. Cervical spondylitis with radiculitis (HCC) (C6) (Bilateral) (R>L)   4. Cervical radiculopathy at C6 (Right)   5. Cervical Grade 1 (2 mm) Retrolisthesis C5 over C6   6. Cervical foraminal stenosis (C3-4 & C5-6) (Bilateral)   7. Cervical facet syndrome (Bilateral) (R>L)   8. DDD (degenerative disc disease), cervical   9. Chronic upper extremity pain (4th area of Pain) (Bilateral) (R>L)   10. Radicular pain of shoulder (Right)   11. Abnormal MRI, cervical spine (10/24/2023)     NAS-11 score:              Pre-procedure: 7 /10              Post-procedure: 0-No pain/10    Post-Procedure Evaluation   Effectiveness:  Initial hour after procedure: 0 % . Subsequent 4-6 hours post-procedure: 0 % . Analgesia past initial 6 hours: 0 % . Ongoing improvement:  Analgesic:  Elizabeth Bowers underwent a therapeutic Cervical Epidural Steroid injection (CESI) on Oct 30, 2023. She reports no relief or improvement in pain symptoms since the procedure. Function: No improvement ROM: No improvement  Pharmacotherapy Assessment  Analgesic: Oxycodone   HCl (IR) 5 mg immediate release every 8 hours as needed for up to 14 days. MME=22.50 Monitoring: [place] PMP: PDMP reviewed during this encounter.       Pharmacotherapy: No side-effects or adverse reactions reported. Compliance: No problems  identified. Effectiveness: Clinically acceptable.  Lennis Rabon, RN  11/08/2023 10:41 AM  Sign when Signing Visit Nursing Pain Medication Assessment:  Safety precautions to be maintained throughout the outpatient stay will include: orient to surroundings, keep bed in low position, maintain call bell within reach at all times, provide assistance with transfer out of bed and ambulation.  Medication Inspection Compliance: Elizabeth Bowers did not comply with our request to bring her pills to be counted. She was reminded that bringing the medication bottles, even when empty, is a requirement.  Medication: None brought in. Pill/Patch Count: None available to be counted. Bottle Appearance: No container available. Did not bring bottle(s) to appointment. Filled Date: N/A Last Medication intake:  "about 3 days ago"    No results found for: "CBDTHCR" No results found for: "D8THCCBX" No results found for: "D9THCCBX"  UDS:  Summary  Date Value Ref Range Status  01/16/2018 FINAL  Final    Comment:    ==================================================================== [facility] COMP DRUG ANALYSIS,UR ==================================================================== Test                             Result       Flag       Units Drug Present and Declared for Prescription Verification   Oxazepam                       51           EXPECTED   ng/mg creat   Temazepam                      44           EXPECTED   ng/mg creat    Oxazepam and temazepam are expected metabolites of diazepam .    Oxazepam is also an expected metabolite of other benzodiazepine    drugs, including chlordiazepoxide, prazepam, clorazepate,    halazepam, and temazepam.  Oxazepam and temazepam are available    as scheduled prescription medications.   Phentermine                     PRESENT      EXPECTED   Duloxetine                      PRESENT      EXPECTED Drug Present not Declared for Prescription Verification   Topiramate                       PRESENT      UNEXPECTED   Acetaminophen                   PRESENT      UNEXPECTED Drug Absent but Declared for Prescription Verification   Cyclobenzaprine                 Not Detected UNEXPECTED   Promethazine                    Not Detected UNEXPECTED ==================================================================== Test                      Result    Flag  Units      Ref Range   Creatinine              57               mg/dL      >=40 ==================================================================== Declared Medications:  The flagging and interpretation on this report are based on the  following declared medications.  Unexpected results may arise from  inaccuracies in the declared medications.  **Note: The testing scope of this panel includes these medications:  Cyclobenzaprine   Diazepam   Duloxetine   Phentermine   Promethazine   **Note: The testing scope of this panel does not include following  reported medications:  Acyclovir   Budesonide   Docusate  Hydrochlorothiazide  (Triamterine-Hydrochlorthzide)  Levocetirizine  Linaclotide   Meclizine   Montelukast   Multivitamin  Potassium  Triamterene  (Triamterine-Hydrochlorthzide)  Valacyclovir  ==================================================================== For clinical consultation, please call [phone]. ====================================================================      ROS  Constitutional: Denies any fever or chills Gastrointestinal: No reported hemesis, hematochezia, vomiting, or acute GI distress Musculoskeletal: Right shoulder pain, headache Neurological: No reported episodes of acute onset apraxia, aphasia, dysarthria, agnosia, amnesia, paralysis, loss of coordination, or loss of consciousness  Medication Review  amLODipine , aspirin-acetaminophen -caffeine , butalbital -acetaminophen -caffeine , hydrOXYzine , ipratropium, ketorolac , lansoprazole , linaclotide , multivitamin with minerals,  ondansetron , oxyCODONE , promethazine , promethazine -dextromethorphan, rizatriptan , and triamterene -hydrochlorothiazide   History Review  Allergy : Elizabeth Bowers is allergic to gluten meal, lactose, ranitidine, sulfa antibiotics, amphetamine-dextroamphetamine, lactose intolerance (gi), reglan [metoclopramide], topiramate  er, wheat, and soap. Drug: Elizabeth Bowers  reports no history of drug use. Alcohol:  reports current alcohol use of about 2.0 - 4.0 standard drinks of alcohol per week. Tobacco:  reports that she quit smoking about 36 years ago. Her smoking use included cigarettes. She started smoking about 41 years ago. She has a 3.8 pack-year smoking history. She has never been exposed to tobacco smoke. She has never used smokeless tobacco. Social: Elizabeth Bowers  reports that she quit smoking about 36 years ago. Her smoking use included cigarettes. She started smoking about 41 years ago. She has a 3.8 pack-year smoking history. She has never been exposed to tobacco smoke. She has never used smokeless tobacco. She reports current alcohol use of about 2.0 - 4.0 standard drinks of alcohol per week. She reports that she does not use drugs. Medical:  has a past medical history of Fatty liver, Fibromyalgia, GERD (gastroesophageal reflux disease), Hemorrhoids, Hot flashes, Hypertension, IBS (irritable bowel syndrome), LUQ abdominal pain (10/25/2016), Migraines, Miscarriage, Multilevel degenerative disc disease, Neuroma, and Vocal cord dysfunction. Surgical: Elizabeth Bowers  has a past surgical history that includes Elbow Debridement; Foot surgery; Cholecystectomy; Colonoscopy; Abdominal hysterectomy; Esophagogastroduodenoscopy (egd) with propofol  (N/A, 02/06/2018); Colonoscopy with propofol  (N/A, 12/22/2019); Colonoscopy with propofol  (N/A, 12/23/2019); Image guided sinus surgery (N/A, 09/28/2021); Maxillary antrostomy (Bilateral, 09/28/2021); Ethmoidectomy (N/A, 09/28/2021); Sphenoidectomy (Bilateral, 09/28/2021); Colonoscopy with propofol  (N/A,  01/01/2023); and polypectomy (01/01/2023). Family: family history includes Alcohol abuse in her father and son; Arthritis in her son and son; Asthma in her sister; COPD in her father, paternal grandmother, and sister; Cancer in her paternal grandfather and sister; Colon cancer in her sister; Colon polyps in her sister; Depression in her son; Diabetes in her maternal grandmother, mother, and son; Drug abuse in her son; Glaucoma in her mother; Hyperlipidemia in her mother; Hypertension in her mother; Kidney disease in her sister; Mental illness in her sister and son; Miscarriages / Stillbirths in her sister and sister; Uterine cancer in her sister.  Laboratory Chemistry Profile   Renal Lab Results  Component Value Date  BUN 28 (H) 10/09/2023   CREATININE 1.03 (H) 10/09/2023   BCR 27 (H) 10/09/2023   GFRAA >60 07/07/2019   GFRNONAA 49 (L) 09/02/2023    Hepatic Lab Results  Component Value Date   AST 21 10/09/2023   ALT 41 (H) 10/09/2023   ALBUMIN 4.5 10/09/2023   ALKPHOS 84 10/09/2023   LIPASE 33 12/15/2016    Electrolytes Lab Results  Component Value Date   NA 139 10/09/2023   K 4.0 10/09/2023   CL 99 10/09/2023   CALCIUM 9.7 10/09/2023   MG 2.2 01/16/2018    Bone Lab Results  Component Value Date   25OHVITD1 51 01/16/2018   25OHVITD2 <1.0 01/16/2018   25OHVITD3 50 01/16/2018    Inflammation (CRP: Acute Phase) (ESR: Chronic Phase) Lab Results  Component Value Date   CRP 0.8 07/05/2021   ESRSEDRATE 17 07/05/2021         Note: Above Lab results reviewed.  Recent Imaging Review  DG PAIN CLINIC C-ARM 1-60 MIN NO REPORT Fluoro was used, but no Radiologist interpretation will be provided.  Please refer to "NOTES" tab for provider progress note. Note: Reviewed         Physical Exam  General appearance: Well nourished, well developed, and well hydrated. In no apparent acute distress Mental status: Alert, oriented x 3 (person, place, & time)       Respiratory: No evidence  of acute respiratory distress Eyes: PERLA Vitals: BP 116/82   Pulse 76   Temp 99 F (37.2 C)   Resp 16   Ht 5\' 2"  (1.575 m)   Wt 180 lb (81.6 kg)   SpO2 98%   BMI 32.92 kg/m  BMI: Estimated body mass index is 32.92 kg/m as calculated from the following:   Height as of this encounter: 5\' 2"  (1.575 m).   Weight as of this encounter: 180 lb (81.6 kg). Ideal: Ideal body weight: 50.1 kg (110 lb 7.2 oz) Adjusted ideal body weight: 62.7 kg (138 lb 4.3 oz)  Assessment   Diagnosis Status  1. Encounter for chronic pain management   2. Cervicalgia (Bilateral) (R>L)   3. Cervicogenic headache   4. Chronic upper extremity pain (4th area of Pain) (Bilateral) (R>L)   5. Chronic pain syndrome   6. Cervical spondylitis with radiculitis (HCC) (C6) (Bilateral) (R>L)   7. Cervical radiculopathy at C6 (Right)   8. Cervical Grade 1 (2 mm) Retrolisthesis C5 over C6   9. Problems influencing health status    Controlled Controlled Controlled   Updated Problems: No problems updated.  Plan of Care  Problem-specific:  Assessment and Plan  Of note, the patient was previously prescribed oxycodone  5 mg IR for up to 14 days by Dr. Barth Borne.  Therefore I have continued the prescription for oxycodone  5 mg IR for up to 14 days.  The patient is advised to follow-up with Dr. Naveira for further evaluation and ongoing pain management. Routine UDS ordered today.   Elizabeth Bowers has a current medication list which includes the following long-term medication(s): amlodipine , ipratropium, lansoprazole , linzess , oxycodone , promethazine , rizatriptan , and triamterene -hydrochlorothiazide .  Pharmacotherapy (Medications Ordered): Meds ordered this encounter  Medications   oxyCODONE  (ROXICODONE ) 5 MG immediate release tablet    Sig: Take 1 tablet (5 mg total) by mouth every 8 (eight) hours as needed for up to 14 days. Must last 30 days    Dispense:  42 tablet    Refill:  0   Orders:  Orders Placed This  Encounter  Procedures   ToxASSURE Select 13 (MW), Urine    Volume: 30 ml(s). Minimum 3 ml of urine is needed. Document temperature of fresh sample. Indications: Long term (current) use of opiate analgesic (Z61.096)    Release to patient:   Immediate        No follow-ups on file.    Recent Visits Date Type Provider Dept  10/30/23 Procedure visit Renaldo Caroli, MD [facility]  10/01/23 Office Visit Renaldo Caroli, MD [facility]  09/26/23 Procedure visit Cephus Collin, MD [facility]  09/19/23 Office Visit Renaldo Caroli, MD [facility]  Showing recent visits within past 90 days and meeting all other requirements Today's Visits Date Type Provider Dept  11/08/23 Office Visit Negan Grudzien K, NP [facility]  Showing today's visits and meeting all other requirements Future Appointments Date Type Provider Dept  12/03/23 Appointment Renaldo Caroli, MD [facility]  Showing future appointments within next 90 days and meeting all other requirements  I discussed the assessment and treatment plan with the patient. The patient was provided an opportunity to ask questions and all were answered. The patient agreed with the plan and demonstrated an understanding of the instructions.  Patient advised to call back or seek an in-person evaluation if the symptoms or condition worsens.  Duration of encounter: 30 minutes.  Total time on encounter, as per AMA guidelines included both the face-to-face and non-face-to-face time personally spent by the physician and/or other qualified health care professional(s) on the day of the encounter (includes time in activities that require the physician or other qualified health care professional and does not include time in activities normally performed by clinical staff). Physician's time may include the following activities when performed: Preparing to see the patient (e.g., pre-charting  review of records, searching for previously ordered imaging, lab work, and nerve conduction tests) Review of prior analgesic pharmacotherapies. Reviewing PMP Interpreting ordered tests (e.g., lab work, imaging, nerve conduction tests) Performing post-procedure evaluations, including interpretation of diagnostic procedures Obtaining and/or reviewing separately obtained history Performing a medically appropriate examination and/or evaluation Counseling and educating the patient/family/caregiver Ordering medications, tests, or procedures Referring and communicating with other health care professionals (when not separately reported) Documenting clinical information in the electronic or other health record Independently interpreting results (not separately reported) and communicating results to the patient/ family/caregiver Care coordination (not separately reported)  Note by: Naven Giambalvo K Damari Hiltz, NP (TTS and AI technology used. I apologize for any typographical errors that were not detected and corrected.) Date: 11/08/2023; Time: 11:39 AM

## 2023-11-08 NOTE — Progress Notes (Signed)
 Nursing Pain Medication Assessment:  Safety precautions to be maintained throughout the outpatient stay will include: orient to surroundings, keep bed in low position, maintain call bell within reach at all times, provide assistance with transfer out of bed and ambulation.  Medication Inspection Compliance: Elizabeth Bowers did not comply with our request to bring her pills to be counted. She was reminded that bringing the medication bottles, even when empty, is a requirement.  Medication: None brought in. Pill/Patch Count: None available to be counted. Bottle Appearance: No container available. Did not bring bottle(s) to appointment. Filled Date: N/A Last Medication intake:  "about 3 days ago"

## 2023-11-13 LAB — TOXASSURE SELECT 13 (MW), URINE

## 2023-11-14 DIAGNOSIS — R293 Abnormal posture: Secondary | ICD-10-CM | POA: Diagnosis not present

## 2023-11-14 DIAGNOSIS — M25511 Pain in right shoulder: Secondary | ICD-10-CM | POA: Diagnosis not present

## 2023-11-14 DIAGNOSIS — M542 Cervicalgia: Secondary | ICD-10-CM | POA: Diagnosis not present

## 2023-11-14 DIAGNOSIS — G4489 Other headache syndrome: Secondary | ICD-10-CM | POA: Diagnosis not present

## 2023-11-16 DIAGNOSIS — R293 Abnormal posture: Secondary | ICD-10-CM | POA: Diagnosis not present

## 2023-11-16 DIAGNOSIS — G4489 Other headache syndrome: Secondary | ICD-10-CM | POA: Diagnosis not present

## 2023-11-16 DIAGNOSIS — M542 Cervicalgia: Secondary | ICD-10-CM | POA: Diagnosis not present

## 2023-11-16 DIAGNOSIS — M25511 Pain in right shoulder: Secondary | ICD-10-CM | POA: Diagnosis not present

## 2023-11-19 ENCOUNTER — Encounter: Payer: Self-pay | Admitting: Neurosurgery

## 2023-11-19 DIAGNOSIS — M7581 Other shoulder lesions, right shoulder: Secondary | ICD-10-CM | POA: Insufficient documentation

## 2023-11-19 DIAGNOSIS — M797 Fibromyalgia: Secondary | ICD-10-CM | POA: Diagnosis not present

## 2023-11-19 DIAGNOSIS — M4722 Other spondylosis with radiculopathy, cervical region: Secondary | ICD-10-CM | POA: Diagnosis not present

## 2023-11-19 DIAGNOSIS — M7541 Impingement syndrome of right shoulder: Secondary | ICD-10-CM | POA: Diagnosis not present

## 2023-11-19 DIAGNOSIS — M67911 Unspecified disorder of synovium and tendon, right shoulder: Secondary | ICD-10-CM | POA: Diagnosis not present

## 2023-11-21 DIAGNOSIS — G4489 Other headache syndrome: Secondary | ICD-10-CM | POA: Diagnosis not present

## 2023-11-21 DIAGNOSIS — R293 Abnormal posture: Secondary | ICD-10-CM | POA: Diagnosis not present

## 2023-11-21 DIAGNOSIS — M542 Cervicalgia: Secondary | ICD-10-CM | POA: Diagnosis not present

## 2023-11-21 DIAGNOSIS — M25511 Pain in right shoulder: Secondary | ICD-10-CM | POA: Diagnosis not present

## 2023-11-23 ENCOUNTER — Other Ambulatory Visit: Payer: Self-pay | Admitting: Family Medicine

## 2023-11-23 DIAGNOSIS — G4489 Other headache syndrome: Secondary | ICD-10-CM | POA: Diagnosis not present

## 2023-11-23 DIAGNOSIS — M25511 Pain in right shoulder: Secondary | ICD-10-CM | POA: Diagnosis not present

## 2023-11-23 DIAGNOSIS — R293 Abnormal posture: Secondary | ICD-10-CM | POA: Diagnosis not present

## 2023-11-23 DIAGNOSIS — M542 Cervicalgia: Secondary | ICD-10-CM | POA: Diagnosis not present

## 2023-11-26 ENCOUNTER — Other Ambulatory Visit: Payer: Self-pay

## 2023-11-26 ENCOUNTER — Other Ambulatory Visit (HOSPITAL_COMMUNITY): Payer: Self-pay

## 2023-11-26 MED ORDER — TRIAMTERENE-HCTZ 37.5-25 MG PO TABS
1.0000 | ORAL_TABLET | Freq: Every day | ORAL | 1 refills | Status: DC
  Filled 2023-11-26: qty 90, 90d supply, fill #0

## 2023-11-27 ENCOUNTER — Telehealth: Payer: Self-pay | Admitting: Pain Medicine

## 2023-11-27 NOTE — Telephone Encounter (Signed)
 PT called to see if her paper work has been filled out. PT stated that she drop paper work off last [employer]. Please give patient a call. TY

## 2023-11-28 ENCOUNTER — Telehealth: Payer: Self-pay | Admitting: Pain Medicine

## 2023-11-28 DIAGNOSIS — M542 Cervicalgia: Secondary | ICD-10-CM | POA: Diagnosis not present

## 2023-11-28 DIAGNOSIS — G4489 Other headache syndrome: Secondary | ICD-10-CM | POA: Diagnosis not present

## 2023-11-28 DIAGNOSIS — M25511 Pain in right shoulder: Secondary | ICD-10-CM | POA: Diagnosis not present

## 2023-11-28 DIAGNOSIS — R293 Abnormal posture: Secondary | ICD-10-CM | POA: Diagnosis not present

## 2023-11-28 NOTE — Telephone Encounter (Signed)
 PT call back stated that she needs to have her paperwork to be able to return back in by this Friday. PT stated that she drop papers off last week. Please give patient a call. TY

## 2023-11-28 NOTE — Telephone Encounter (Signed)
 Dr. Tanya does not do FMLA paperwork.  Patient came to clinic on 11/28/23 to pick up the uncompleted forms she had dropped off. Patient is aware.

## 2023-11-30 DIAGNOSIS — R293 Abnormal posture: Secondary | ICD-10-CM | POA: Diagnosis not present

## 2023-11-30 DIAGNOSIS — M542 Cervicalgia: Secondary | ICD-10-CM | POA: Diagnosis not present

## 2023-11-30 DIAGNOSIS — M25511 Pain in right shoulder: Secondary | ICD-10-CM | POA: Diagnosis not present

## 2023-11-30 DIAGNOSIS — G4489 Other headache syndrome: Secondary | ICD-10-CM | POA: Diagnosis not present

## 2023-12-03 ENCOUNTER — Ambulatory Visit (HOSPITAL_BASED_OUTPATIENT_CLINIC_OR_DEPARTMENT_OTHER): Admitting: Pain Medicine

## 2023-12-03 DIAGNOSIS — G8929 Other chronic pain: Secondary | ICD-10-CM

## 2023-12-03 DIAGNOSIS — G4489 Other headache syndrome: Secondary | ICD-10-CM | POA: Diagnosis not present

## 2023-12-03 DIAGNOSIS — Z91199 Patient's noncompliance with other medical treatment and regimen due to unspecified reason: Secondary | ICD-10-CM

## 2023-12-03 DIAGNOSIS — M542 Cervicalgia: Secondary | ICD-10-CM | POA: Diagnosis not present

## 2023-12-03 DIAGNOSIS — M25511 Pain in right shoulder: Secondary | ICD-10-CM | POA: Diagnosis not present

## 2023-12-03 DIAGNOSIS — R293 Abnormal posture: Secondary | ICD-10-CM | POA: Diagnosis not present

## 2023-12-03 NOTE — Progress Notes (Signed)
 Department: [facility]) Date: 12/03/2023  Encounter Type: Follow-up evaluation.          Event: NO SHOW.                   Reason: None communicated.

## 2023-12-05 DIAGNOSIS — R293 Abnormal posture: Secondary | ICD-10-CM | POA: Diagnosis not present

## 2023-12-05 DIAGNOSIS — G4489 Other headache syndrome: Secondary | ICD-10-CM | POA: Diagnosis not present

## 2023-12-05 DIAGNOSIS — M25511 Pain in right shoulder: Secondary | ICD-10-CM | POA: Diagnosis not present

## 2023-12-05 DIAGNOSIS — M542 Cervicalgia: Secondary | ICD-10-CM | POA: Diagnosis not present

## 2023-12-09 NOTE — Progress Notes (Unsigned)
 PROVIDER NOTE: Interpretation of information contained herein should be left to medically-trained personnel. Specific patient instructions are provided elsewhere under Patient Instructions section of medical record. This document was created in part using AI and STT-dictation technology, any transcriptional errors that may result from this process are unintentional.  Patient: Elizabeth Bowers  Service: E/M   PCP: Myrla Jon HERO, MD  DOB: 01/26/1966  DOS: 12/10/2023  Provider: Eric DELENA Como, MD  MRN: [id number]  Delivery: Face-to-face  Specialty: Interventional Pain Management  Type: Established Patient  Setting: Ambulatory outpatient facility  Specialty designation: 09  Referring Prov.: Bacigalupo, Jon HERO, MD  Location: Outpatient office facility       History of present illness (HPI) Elizabeth Bowers, a 58 y.o. year old female, is here today because of her No primary diagnosis found.. Elizabeth Bowers primary complain today is No chief complaint on file.  Pertinent problems: Elizabeth Bowers has Chronic sacroiliac joint pain (Right); Fibromyalgia; Chronic knee pain (Right); DDD (degenerative disc disease), lumbar; Chronic low back pain (2ry area of Pain) (Bilateral) (L>R) w/ sciatica (Bilateral); Chronic lower extremity pain (3ry area of Pain) (Bilateral) (L>R); Chronic neck pain (1ry area of Pain) (Bilateral) (midline); Chronic foot pain (Left); Chronic pain syndrome; Ganglion cyst; Chronic upper extremity pain (4th area of Pain) (Bilateral) (R>L); Cervical Grade 1 (2 mm) Retrolisthesis C5 over C6; Cervical foraminal stenosis (C3-4 & C5-6) (Bilateral); DDD (degenerative disc disease), cervical; Spondylosis without myelopathy or radiculopathy, cervical region; Chronic hip pain (Bilateral) (L>R); Lumbar facet syndrome (Bilateral) (R>L); Spondylosis without myelopathy or radiculopathy, lumbar region; Chronic Sacroiliac joint dysfunction (Bilateral) (R>L); Chronic Somatic dysfunction of sacroiliac joint  (Bilateral) (R>L); Osteoarthritis of hip (Bilateral); Cervicogenic headache; Cervicalgia (Bilateral) (R>L); Cervical facet syndrome (Bilateral) (R>L); Chronic occipital neuralgia (Bilateral); Cervical spondylitis with radiculitis (HCC) (C6) (Bilateral) (R>L); Chronic sacroiliac joint pain (Bilateral) (R>L); Abnormal MRI, cervical spine (10/24/2023); Chronic low back pain (Bilateral) w/o sciatica; Anterior tibialis tendinitis, right; Radial styloid tenosynovitis of left hand; Pain in right hand; Bilateral carpal tunnel syndrome; Chronic pain of left wrist; Morton's neuroma of foot (Left); Chronic hip pain (Right); Impaired range of motion of hip (Right); Chronic lower extremity pain (Right); Lower extremity radicular pain (Right); Lumbar radiculitis (L3/L4) (Right); Cervical radiculopathy at C6 (Right); Pain in right shoulder; Chronic shoulder pain (Right); and Radicular pain of shoulder (Right) on their pertinent problem list.  Pain Assessment: Severity of   is reported as a  /10. Location:    / . Onset:  . Quality:  . Timing:  . Modifying factor(s):  SABRA Vitals:  vitals were not taken for this visit.  BMI: Estimated body mass index is 32.92 kg/m as calculated from the following:   Height as of 11/08/23: 5' 2 (1.575 m).   Weight as of 11/08/23: 180 lb (81.6 kg).  Last encounter: 10/24/2023. Last procedure: 10/30/2023.  Reason for encounter:  *** .   Discussed the use of AI scribe software for clinical note transcription with the patient, who gave verbal consent to proceed.  History of Present Illness           Pharmacotherapy Assessment   Chronic Opioid Analgesic: No chronic opioid analgesics therapy prescribed by our practice.  Monitoring: [place] PMP: PDMP reviewed during this encounter.       Pharmacotherapy: No side-effects or adverse reactions reported. Compliance: No problems identified. Effectiveness: Clinically acceptable.  No notes on file  UDS:  Summary  Date Value Ref Range Status   11/08/2023 FINAL  Final  Comment:    ==================================================================== [employer] Select 13 (MW) ==================================================================== Test                             Result       Flag       Units  Drug Absent but Declared for Prescription Verification   Oxycodone                       Not Detected UNEXPECTED ng/mg creat   Butalbital                      Not Detected UNEXPECTED ==================================================================== Test                      Result    Flag   Units      Ref Range   Creatinine              174              mg/dL      >=79 ==================================================================== Declared Medications:  The flagging and interpretation on this report are based on the  following declared medications.  Unexpected results may arise from  inaccuracies in the declared medications.   **Note: The testing scope of this panel includes these medications:   Butalbital  (Fioricet )  Oxycodone  (Roxicodone )   **Note: The testing scope of this panel does not include the  following reported medications:   Acetaminophen  (Excedrin )  Acetaminophen  (Fioricet )  Amlodipine  (Norvasc )  Aspirin (Excedrin )  Caffeine  (Excedrin )  Caffeine  (Fioricet )  Dextromethorphan  Hydrochlorothiazide  (Maxzide )  Hydroxyzine  (Atarax )  Ipratropium (Atrovent )  Ketorolac  (Toradol )  Lansoprazole  (Prevacid )  Linaclotide  (Linzess )  Multivitamin  Ondansetron  (Zofran )  Promethazine  (Phenergan )  Rizatriptan  (Maxalt )  Triamterene  (Maxzide ) ==================================================================== For clinical consultation, please call [phone]. ====================================================================     No results found for: CBDTHCR No results found for: D8THCCBX No results found for: D9THCCBX  ROS  Constitutional: Denies any fever or chills Gastrointestinal:  No reported hemesis, hematochezia, vomiting, or acute GI distress Musculoskeletal: Denies any acute onset joint swelling, redness, loss of ROM, or weakness Neurological: No reported episodes of acute onset apraxia, aphasia, dysarthria, agnosia, amnesia, paralysis, loss of coordination, or loss of consciousness  Medication Review  amLODipine , aspirin-acetaminophen -caffeine , butalbital -acetaminophen -caffeine , hydrOXYzine , ipratropium, ketorolac , lansoprazole , linaclotide , multivitamin with minerals, ondansetron , oxyCODONE , promethazine , promethazine -dextromethorphan, rizatriptan , and triamterene -hydrochlorothiazide   History Review  Allergy : Elizabeth Bowers is allergic to gluten meal, lactose, ranitidine, sulfa antibiotics, amphetamine-dextroamphetamine, lactose intolerance (gi), reglan [metoclopramide], topiramate  er, wheat, and soap. Drug: Elizabeth Bowers  reports no history of drug use. Alcohol:  reports current alcohol use of about 2.0 - 4.0 standard drinks of alcohol per week. Tobacco:  reports that she quit smoking about 36 years ago. Her smoking use included cigarettes. She started smoking about 41 years ago. She has a 3.8 pack-year smoking history. She has never been exposed to tobacco smoke. She has never used smokeless tobacco. Social: Elizabeth Bowers  reports that she quit smoking about 36 years ago. Her smoking use included cigarettes. She started smoking about 41 years ago. She has a 3.8 pack-year smoking history. She has never been exposed to tobacco smoke. She has never used smokeless tobacco. She reports current alcohol use of about 2.0 - 4.0 standard drinks of alcohol per week. She reports that she does not use drugs. Medical:  has a past medical history of Fatty liver, Fibromyalgia, GERD (gastroesophageal reflux disease), Hemorrhoids, Hot  flashes, Hypertension, IBS (irritable bowel syndrome), LUQ abdominal pain (10/25/2016), Migraines, Miscarriage, Multilevel degenerative disc disease, Neuroma, and Vocal cord  dysfunction. Surgical: Elizabeth Bowers  has a past surgical history that includes Elbow Debridement; Foot surgery; Cholecystectomy; Colonoscopy; Abdominal hysterectomy; Esophagogastroduodenoscopy (egd) with propofol  (N/A, 02/06/2018); Colonoscopy with propofol  (N/A, 12/22/2019); Colonoscopy with propofol  (N/A, 12/23/2019); Image guided sinus surgery (N/A, 09/28/2021); Maxillary antrostomy (Bilateral, 09/28/2021); Ethmoidectomy (N/A, 09/28/2021); Sphenoidectomy (Bilateral, 09/28/2021); Colonoscopy with propofol  (N/A, 01/01/2023); and polypectomy (01/01/2023). Family: family history includes Alcohol abuse in her father and son; Arthritis in her son and son; Asthma in her sister; COPD in her father, paternal grandmother, and sister; Cancer in her paternal grandfather and sister; Colon cancer in her sister; Colon polyps in her sister; Depression in her son; Diabetes in her maternal grandmother, mother, and son; Drug abuse in her son; Glaucoma in her mother; Hyperlipidemia in her mother; Hypertension in her mother; Kidney disease in her sister; Mental illness in her sister and son; Miscarriages / Stillbirths in her sister and sister; Uterine cancer in her sister.  Laboratory Chemistry Profile   Renal Lab Results  Component Value Date   BUN 28 (H) 10/09/2023   CREATININE 1.03 (H) 10/09/2023   BCR 27 (H) 10/09/2023   GFRAA >60 07/07/2019   GFRNONAA 49 (L) 09/02/2023    Hepatic Lab Results  Component Value Date   AST 21 10/09/2023   ALT 41 (H) 10/09/2023   ALBUMIN 4.5 10/09/2023   ALKPHOS 84 10/09/2023   LIPASE 33 12/15/2016    Electrolytes Lab Results  Component Value Date   NA 139 10/09/2023   K 4.0 10/09/2023   CL 99 10/09/2023   CALCIUM 9.7 10/09/2023   MG 2.2 01/16/2018    Bone Lab Results  Component Value Date   25OHVITD1 51 01/16/2018   25OHVITD2 <1.0 01/16/2018   25OHVITD3 50 01/16/2018    Inflammation (CRP: Acute Phase) (ESR: Chronic Phase) Lab Results  Component Value Date   CRP 0.8  07/05/2021   ESRSEDRATE 17 07/05/2021         Note: Above Lab results reviewed.  Recent Imaging Review  MR SHOULDER RIGHT WO CONTRAST CLINICAL DATA:  Right shoulder pain, multiple falls, limited range of motion  EXAM: MRI OF THE RIGHT SHOULDER WITHOUT CONTRAST  TECHNIQUE: Multiplanar, multisequence MR imaging of the shoulder was performed. No intravenous contrast was administered.  COMPARISON:  None Available.  FINDINGS: Rotator cuff: Supraspinatus tendon is intact. Moderate infraspinatus tendinosis. Teres minor tendon is intact. Moderate subscapularis tendinosis with a small interstitial tear.  Muscles: No muscle atrophy or edema. No intramuscular fluid collection or hematoma.  Biceps Long Head: Intraarticular and extraarticular portions of the biceps tendon are intact.  Acromioclavicular Joint: Mild-moderate arthropathy of the acromioclavicular joint. No subacromial/subdeltoid bursal fluid.  Glenohumeral Joint: No joint effusion. No chondral defect. Mild thickening of the inferior joint capsule and effacement of the normal subcoracoid fat as can be seen with adhesive capsulitis.  Labrum: Grossly intact, but evaluation is limited by lack of intraarticular fluid/contrast.  Bones: No fracture or dislocation. No aggressive osseous lesion.  Other: No fluid collection or hematoma.  IMPRESSION: 1. Moderate infraspinatus tendinosis. 2. Moderate subscapularis tendinosis with a small interstitial tear. 3. Mild thickening of the inferior joint capsule and effacement of the normal subcoracoid fat as can be seen with adhesive capsulitis.  Electronically Signed   By: Julaine Blanch M.D.   On: 11/17/2023 08:27 Note: Reviewed        Physical Exam  Vitals:  There were no vitals taken for this visit. BMI: Estimated body mass index is 32.92 kg/m as calculated from the following:   Height as of 11/08/23: 5' 2 (1.575 m).   Weight as of 11/08/23: 180 lb (81.6 kg). Ideal: Patient  weight not recorded General appearance: Well nourished, well developed, and well hydrated. In no apparent acute distress Mental status: Alert, oriented x 3 (person, place, & time)       Respiratory: No evidence of acute respiratory distress Eyes: PERLA   Assessment   Diagnosis Status  No diagnosis found. Controlled Controlled Controlled   Updated Problems: No problems updated.  Plan of Care  Problem-specific:  Assessment and Plan            Elizabeth Bowers has a current medication list which includes the following long-term medication(s): amlodipine , ipratropium, lansoprazole , linzess , oxycodone , promethazine , rizatriptan , and triamterene -hydrochlorothiazide .  Pharmacotherapy (Medications Ordered): No orders of the defined types were placed in this encounter.  Orders:  No orders of the defined types were placed in this encounter.    Interventional Therapies  Risk  Complexity Considerations:   Anxiety/Depression  GERD  HTN  IBS  Stage 3 CKD     Planned  Pending:      Under consideration:   Palliative/therapeutic right cervical ESI #5    Completed:   Therapeutic bilateral C2-3 and C3-4 cervical facet MBB by Dr. Marcelino (pain worsened)  Diagnostic/therapeutic right L3-4 LESI x1 (09/08/2021) (100/100/100/100)  Palliative bilateral cervical facet MBB x2 (06/13/2018) (100/100/0/<25)  Diagnostic bilateral lumbar facet MBB x2 (08/08/2018) [facility])  Diagnostic right Cervical ESI x4 (02/01/2023) (100/50/100/100)  Therapeutic right cervical facet RFA x1 (07/23/2018) (100/50/0) (No benefit)  (10/24/2023) referral to neurosurgery for cervical spine evaluation (Dr. Katrina) (scheduled for 11/01/2023)    Completed by other providers:   Therapeutic sphenopalatine ganglion Blk by Dr. Loreli x2 (03/04/2018) Diagnostic bilateral upper extremity EMG/PNCV by Dr. Loreli: Right Grade II & Left Grade I CTS (08/23/2018)    Therapeutic  Palliative (PRN) options:    Therapeutic/palliative cervical facet MBB   Therapeutic/palliativelumbar facet MBB   Therapeutic/palliative Cervical ESI   Therapeutic/palliative cervical facet RFA       No follow-ups on file.    Recent Visits Date Type Provider Dept  11/08/23 Office Visit Patel, Seema K, NP [facility]  10/30/23 Procedure visit Tanya Glisson, MD [facility]  10/01/23 Office Visit Tanya Glisson, MD [facility]  09/26/23 Procedure visit Marcelino Nurse, MD [facility]  09/19/23 Office Visit Tanya Glisson, MD [facility]  Showing recent visits within past 90 days and meeting all other requirements Future Appointments Date Type Provider Dept  12/10/23 Appointment Tanya Glisson, MD [facility]  Showing future appointments within next 90 days and meeting all other requirements  I discussed the assessment and treatment plan with the patient. The patient was provided an opportunity to ask questions and all were answered. The patient agreed with the plan and demonstrated an understanding of the instructions.  Patient advised to call back or seek an in-person evaluation if the symptoms or condition worsens.  Duration of encounter: *** minutes.  Total time on encounter, as per AMA guidelines included both the face-to-face and non-face-to-face time personally spent by the physician and/or other qualified health care professional(s) on the day of the encounter (includes time in activities that require the physician or other qualified health care professional and does not include time in activities normally performed by clinical staff). Physician's  time may include the following activities when performed: Preparing to see the patient (e.g., pre-charting review of records, searching for previously ordered imaging, lab work, and nerve conduction tests) Review of prior analgesic pharmacotherapies. Reviewing PMP Interpreting ordered tests  (e.g., lab work, imaging, nerve conduction tests) Performing post-procedure evaluations, including interpretation of diagnostic procedures Obtaining and/or reviewing separately obtained history Performing a medically appropriate examination and/or evaluation Counseling and educating the patient/family/caregiver Ordering medications, tests, or procedures Referring and communicating with other health care professionals (when not separately reported) Documenting clinical information in the electronic or other health record Independently interpreting results (not separately reported) and communicating results to the patient/ family/caregiver Care coordination (not separately reported)  Note by: Eric DELENA Como, MD (TTS and AI technology used. I apologize for any typographical errors that were not detected and corrected.) Date: 12/10/2023; Time: 1:20 PM

## 2023-12-10 ENCOUNTER — Ambulatory Visit (HOSPITAL_BASED_OUTPATIENT_CLINIC_OR_DEPARTMENT_OTHER): Admitting: Pain Medicine

## 2023-12-10 DIAGNOSIS — Z09 Encounter for follow-up examination after completed treatment for conditions other than malignant neoplasm: Secondary | ICD-10-CM

## 2023-12-10 DIAGNOSIS — Z91199 Patient's noncompliance with other medical treatment and regimen due to unspecified reason: Secondary | ICD-10-CM

## 2023-12-13 DIAGNOSIS — Z1331 Encounter for screening for depression: Secondary | ICD-10-CM | POA: Diagnosis not present

## 2023-12-13 DIAGNOSIS — M797 Fibromyalgia: Secondary | ICD-10-CM | POA: Diagnosis not present

## 2023-12-13 DIAGNOSIS — F419 Anxiety disorder, unspecified: Secondary | ICD-10-CM | POA: Diagnosis not present

## 2023-12-13 DIAGNOSIS — Z8659 Personal history of other mental and behavioral disorders: Secondary | ICD-10-CM | POA: Diagnosis not present

## 2023-12-13 DIAGNOSIS — G43719 Chronic migraine without aura, intractable, without status migrainosus: Secondary | ICD-10-CM | POA: Diagnosis not present

## 2023-12-13 DIAGNOSIS — M24119 Other articular cartilage disorders, unspecified shoulder: Secondary | ICD-10-CM | POA: Diagnosis not present

## 2023-12-13 DIAGNOSIS — M5030 Other cervical disc degeneration, unspecified cervical region: Secondary | ICD-10-CM | POA: Diagnosis not present

## 2023-12-14 ENCOUNTER — Ambulatory Visit: Payer: Self-pay

## 2023-12-14 ENCOUNTER — Emergency Department

## 2023-12-14 ENCOUNTER — Emergency Department
Admission: EM | Admit: 2023-12-14 | Discharge: 2023-12-15 | Disposition: A | Discharge status: Home / Self Care | Attending: Emergency Medicine | Admitting: Emergency Medicine

## 2023-12-14 ENCOUNTER — Other Ambulatory Visit: Payer: Self-pay

## 2023-12-14 DIAGNOSIS — R635 Abnormal weight gain: Secondary | ICD-10-CM | POA: Diagnosis not present

## 2023-12-14 DIAGNOSIS — M542 Cervicalgia: Secondary | ICD-10-CM | POA: Diagnosis not present

## 2023-12-14 DIAGNOSIS — G4489 Other headache syndrome: Secondary | ICD-10-CM | POA: Diagnosis not present

## 2023-12-14 DIAGNOSIS — E876 Hypokalemia: Secondary | ICD-10-CM | POA: Diagnosis not present

## 2023-12-14 DIAGNOSIS — M25511 Pain in right shoulder: Secondary | ICD-10-CM | POA: Diagnosis not present

## 2023-12-14 DIAGNOSIS — R293 Abnormal posture: Secondary | ICD-10-CM | POA: Diagnosis not present

## 2023-12-14 DIAGNOSIS — M40204 Unspecified kyphosis, thoracic region: Secondary | ICD-10-CM | POA: Diagnosis not present

## 2023-12-14 DIAGNOSIS — R2243 Localized swelling, mass and lump, lower limb, bilateral: Secondary | ICD-10-CM | POA: Diagnosis present

## 2023-12-14 DIAGNOSIS — R600 Localized edema: Secondary | ICD-10-CM | POA: Diagnosis not present

## 2023-12-14 DIAGNOSIS — R0600 Dyspnea, unspecified: Secondary | ICD-10-CM | POA: Insufficient documentation

## 2023-12-14 DIAGNOSIS — I700 Atherosclerosis of aorta: Secondary | ICD-10-CM | POA: Diagnosis not present

## 2023-12-14 DIAGNOSIS — R0602 Shortness of breath: Secondary | ICD-10-CM | POA: Diagnosis not present

## 2023-12-14 LAB — BASIC METABOLIC PANEL WITH GFR
Anion gap: 13 (ref 5–15)
BUN: 14 mg/dL (ref 6–20)
CO2: 28 mmol/L (ref 22–32)
Calcium: 9.6 mg/dL (ref 8.9–10.3)
Chloride: 100 mmol/L (ref 98–111)
Creatinine, Ser: 0.95 mg/dL (ref 0.44–1.00)
GFR, Estimated: >60 mL/min (ref >60)
Glucose, Bld: 105 mg/dL — ABNORMAL HIGH (ref 70–99)
Potassium: 3.3 mmol/L — ABNORMAL LOW (ref 3.5–5.1)
Sodium: 141 mmol/L (ref 135–145)

## 2023-12-14 LAB — CBC
HCT: 44.2 % (ref 36.0–46.0)
Hemoglobin: 14.6 g/dL (ref 12.0–15.0)
MCH: 30.2 pg (ref 26.0–34.0)
MCHC: 33 g/dL (ref 30.0–36.0)
MCV: 91.5 fL (ref 80.0–100.0)
Platelets: 290 K/uL (ref 150–400)
RBC: 4.83 MIL/uL (ref 3.87–5.11)
RDW: 12.8 % (ref 11.5–15.5)
WBC: 7.1 K/uL (ref 4.0–10.5)
nRBC: 0 % (ref 0.0–0.2)

## 2023-12-14 LAB — BRAIN NATRIURETIC PEPTIDE: B Natriuretic Peptide: 22.9 pg/mL (ref 0.0–100.0)

## 2023-12-14 LAB — TROPONIN I (HIGH SENSITIVITY)
Troponin I (High Sensitivity): 3 ng/L (ref <18)
Troponin I (High Sensitivity): 4 ng/L (ref <18)

## 2023-12-14 LAB — D-DIMER, QUANTITATIVE: D-Dimer, Quant: 0.39 ug{FEU}/mL (ref 0.00–0.50)

## 2023-12-14 NOTE — ED Triage Notes (Addendum)
 Patient C/O bilateral lower leg swelling and SOB that began about a week ago. Patient did see her Primary and was told that she needed to be evaluated for CHF. Patient is on antihypertensives and has also been taking hydrochlorothiazide , but is still having swelling. Patient endorses around a 20lb weight gain in the last two months as well.

## 2023-12-14 NOTE — ED Provider Notes (Signed)
 [facility] Provider Note    Event Date/Time   First MD Initiated Contact with Patient 12/14/23 2040     (approximate)   History   Leg Swelling and Shortness of Breath   HPI  Elizabeth Bowers is a 58 y.o. female with history of migraines presenting with leg swelling, weight gain as well as intermittent dyspnea on exertion for last several weeks.  She denies any chest pain.  No recent travel or surgeries, no history of DVTs or cancer, not on any hormones.  No hemoptysis.  No fevers.  No cough.  Was trying to have a primary care doctor refer to heart failure or a cardiologist but she was unable to get into see her primary care doctor earlier to get this referral set up.  On independent chart review, she is seen by neurology for her chronic migraines.  She was ordered for the Nerivio device.  States no headache at this time.     Physical Exam   Triage Vital Signs: ED Triage Vitals  Encounter Vitals Group     BP 12/14/23 1959 (!) 135/91     Girls Systolic BP Percentile --      Girls Diastolic BP Percentile --      Boys Systolic BP Percentile --      Boys Diastolic BP Percentile --      Pulse Rate 12/14/23 1959 79     Resp 12/14/23 1959 20     Temp 12/14/23 1959 99.4 F (37.4 C)     Temp Source 12/14/23 1959 Oral     SpO2 12/14/23 1959 99 %     Weight --      Height 12/14/23 2000 5' 2 (1.575 m)     Head Circumference --      Peak Flow --      Pain Score 12/14/23 2000 0     Pain Loc --      Pain Education --      Exclude from Growth Chart --     Most recent vital signs: Vitals:   12/14/23 1959 12/14/23 2300  BP: (!) 135/91 130/85  Pulse: 79 89  Resp: 20 18  Temp: 99.4 F (37.4 C)   SpO2: 99% 98%     General: Awake, no distress.  CV:  Good peripheral perfusion.  Resp:  Normal effort.  No tachypnea or increased work of breathing Abd:  No distention.  Soft nontender Other:  Bilateral lower extremity edema that appears  symmetrical   ED Results / Procedures / Treatments   Labs (all labs ordered are listed, but only abnormal results are displayed) Labs Reviewed  BASIC METABOLIC PANEL WITH GFR - Abnormal; Notable for the following components:      Result Value   Potassium 3.3 (*)    Glucose, Bld 105 (*)    All other components within normal limits  CBC  BRAIN NATRIURETIC PEPTIDE  D-DIMER, QUANTITATIVE  TROPONIN I (HIGH SENSITIVITY)  TROPONIN I (HIGH SENSITIVITY)     EKG  EKG shows, sinus rhythm, rate 81, normal QRS, normal QTc, T wave flattening in aVL, no obvious ischemic ST elevation, not significant change compared to prior   RADIOLOGY On my independent interpretation, chest x-ray without obvious consolidation   PROCEDURES:  Critical Care performed: No  Ultrasound ED Thoracic  Date/Time: 12/14/2023 10:33 PM  Performed by: Waymond Lorelle Cummins, MD Authorized by: Waymond Lorelle Cummins, MD   Procedure details:    Indications: dyspnea  Left lung pleural:  Visualized   Right lung pleural:  Visualized Findings:    A-lines noted throughout: identified     B-lines noted throughout: not identified   Impression:    Impression: none   Comments:     No B-lines or pleural effusions noted bilaterally. Ultrasound ED Echo  Date/Time: 12/14/2023 10:33 PM  Performed by: Waymond Lorelle Cummins, MD Authorized by: Waymond Lorelle Cummins, MD   Procedure details:    Indications: dyspnea     Views: parasternal long axis view     Images: not archived   Findings:    Pericardium: no pericardial effusion     LV Function: normal (>50% EF)     RV Diameter: normal   Impression:    Impression: normal      MEDICATIONS ORDERED IN ED: Medications - No data to display   IMPRESSION / MDM / ASSESSMENT AND PLAN / ED COURSE  I reviewed the triage vital signs and the nursing notes.                              Differential diagnosis includes, but is not limited to, CHF, lymphedema, ACS, to consider viral illness or pneumonia but  she has no infectious symptoms at this time.  Did consider PE but she is not hypoxic, not tachycardic, no other risk factors.  Will get a D-dimer, labs, EKG, troponin, chest x-ray, bedside ultrasound.  Patient's presentation is most consistent with acute presentation with potential threat to life or bodily function.  Independent interpretation of labs and imaging below.  This ultrasound without obvious B-lines or pleural effusions, her echo showed normal contractility, no pericardial effusion, RV did not look enlarged.  Patient signed out pending D-dimer and repeat troponin.  If negative, able to discharge home.  Did put in a referral for heart failure clinic for her to follow-up outpatient.   The patient is on the cardiac monitor to evaluate for evidence of arrhythmia and/or significant heart rate changes.   Clinical Course as of 12/14/23 2313  Fri Dec 14, 2023  2215 DG Chest 2 View IMPRESSION: No evidence of acute chest disease. Stable chest with aortic atherosclerosis.   [TT]  2235 Independent review of labs, BNP and initial troponin are negative, electrolytes not severely deranged, no leukocytosis. [TT]    Clinical Course User Index [TT] Waymond, Lorelle Cummins, MD     FINAL CLINICAL IMPRESSION(S) / ED DIAGNOSES   Final diagnoses:  Leg edema  Dyspnea, unspecified type  Weight gain     Rx / DC Orders   ED Discharge Orders          Ordered    Ambulatory referral to Cardiology       Comments: If you have not heard from the Cardiology office within the next 72 hours please call [phone].   12/14/23 2259             Note:  This document was prepared using Dragon voice recognition software and may include unintentional dictation errors.    Waymond Lorelle Cummins, MD 12/14/23 [id number]

## 2023-12-14 NOTE — Telephone Encounter (Signed)
 FYI Only or Action Required?: Action required by provider: request for appointment.  Patient was last seen in primary care on 10/01/2023 by Myrla Jon HERO, MD.  Called Nurse Triage reporting Leg Swelling.  Symptoms began on and off x 1 year, worsening x 3-4 days.  Interventions attempted: elevated her extremities at night Prescription medications: hctz.  Symptoms are: severe bilateral lower extremity swelling with pain, redness at ankles, open wounds to heels and toes, mild SOB with exertion, 20 pound weight gain in a week gradually worsening.  Triage Disposition: Go to ED Now (Notify PCP)  Patient/caregiver understands and will follow disposition?: No, wishes to speak with PCP        Copied from CRM [id number]. Topic: Clinical - Red Word Triage >> Dec 14, 2023  1:21 PM Silvana PARAS wrote: Red Word that prompted transfer to Nurse Triage: Pitting edema Reason for Disposition  [1] Can't walk or can barely walk AND [2] new-onset  Answer Assessment - Initial Assessment Questions Called [facility] and notified staff of ED refusal and patient becoming upset about Dr Myrla not having any availability for office visits.  1. ONSET: When did the swelling start? (e.g., minutes, hours, days)     On and off x 1 year. Worsened X 3-4 days.  2. LOCATION: What part of the leg is swollen?  Are both legs swollen or just one leg?     Bilateral lower legs from knee to foot.  3. SEVERITY: How bad is the swelling? (e.g., localized; mild, moderate, severe)     She states the swelling is in lower legs from below knees to ankles and feet. She states it is pitting edema. She states the swelling improves if she gets a mattress on the floor and sleeps with her feet elevated on the couch.  4. REDNESS: Is there redness or signs of infection?     Yes, redness towards the ankles. Open wounds at heels and toes.  5. PAIN: Is the swelling painful to touch? If Yes, ask: How painful is it?   (Scale  1-10; mild, moderate or severe)     8/10. She states when her feet are swollen she can barely walk due to pain.  6. FEVER: Do you have a fever? If Yes, ask: What is it, how was it measured, and when did it start?      No.  7. CAUSE: What do you think is causing the leg swelling?     Unsure.  8. MEDICAL HISTORY: Do you have a history of blood clots (e.g., DVT), cancer, heart failure, kidney disease, or liver failure?     No.  9. RECURRENT SYMPTOM: Have you had leg swelling before? If Yes, ask: When was the last time? What happened that time?     Yes, she states it has never been this bad.  10. OTHER SYMPTOMS: Do you have any other symptoms? (e.g., chest pain, difficulty breathing)       Patient states she has gained 20 pounds in the past week. Patient states  mild SOB with exertion when my feet get really swollen, I'm struggling to walk so it's hard to breathe too.  11. PREGNANCY: Is there any chance you are pregnant? When was your last menstrual period?       N/A.  Protocols used: Leg Swelling and Edema-A-AH

## 2023-12-14 NOTE — ED Provider Notes (Signed)
 11:20 PM  Assumed care at shift change.  Patient here for leg swelling.  BNP normal.  First troponin negative.  D-dimer negative.  Second troponin pending.  If unremarkable, anticipate discharge home with outpatient follow-up.  12:10 AM  Pt's second troponin negative.  Will discharge home.  Cardiology follow-up scheduled by prior provider.   Elizabeth Bowers, Josette SAILOR, DO 12/15/23 0010

## 2023-12-14 NOTE — Discharge Instructions (Signed)
 I put in a referral for heart failure clinic for you, you should hear back from them early next week for an appointment.

## 2023-12-17 DIAGNOSIS — G4489 Other headache syndrome: Secondary | ICD-10-CM | POA: Diagnosis not present

## 2023-12-17 DIAGNOSIS — M25511 Pain in right shoulder: Secondary | ICD-10-CM | POA: Diagnosis not present

## 2023-12-17 DIAGNOSIS — M542 Cervicalgia: Secondary | ICD-10-CM | POA: Diagnosis not present

## 2023-12-17 DIAGNOSIS — R293 Abnormal posture: Secondary | ICD-10-CM | POA: Diagnosis not present

## 2023-12-18 ENCOUNTER — Telehealth: Payer: Self-pay | Admitting: Cardiology

## 2023-12-18 NOTE — Telephone Encounter (Signed)
 Noted

## 2023-12-18 NOTE — Progress Notes (Unsigned)
 PROVIDER NOTE: Interpretation of information contained herein should be left to medically-trained personnel. Specific patient instructions are provided elsewhere under Patient Instructions section of medical record. This document was created in part using AI and STT-dictation technology, any transcriptional errors that may result from this process are unintentional.  Patient: Elizabeth Bowers  Service: E/M   PCP: Myrla Jon HERO, MD  DOB: 10-21-1965  DOS: 12/19/2023  Provider: Eric DELENA Como, MD  MRN: [id number]  Delivery: Face-to-face  Specialty: Interventional Pain Management  Type: Established Patient  Setting: Ambulatory outpatient facility  Specialty designation: 09  Referring Prov.: Bacigalupo, Jon HERO, MD  Location: Outpatient office facility       History of present illness (HPI) [name], a 58 y.o. year old female, is here today because of her Cervicalgia [M54.2]. [name] primary complain today is No chief complaint on file.  Pertinent problems: [name] has Chronic sacroiliac joint pain (Right); Fibromyalgia; Chronic knee pain (Right); DDD (degenerative disc disease), lumbar; Chronic low back pain (2ry area of Pain) (Bilateral) (L>R) w/ sciatica (Bilateral); Chronic lower extremity pain (3ry area of Pain) (Bilateral) (L>R); Chronic neck pain (1ry area of Pain) (Bilateral) (midline); Chronic foot pain (Left); Chronic pain syndrome; Ganglion cyst; Chronic upper extremity pain (4th area of Pain) (Bilateral) (R>L); Cervical Grade 1 (2 mm) Retrolisthesis C5 over C6; Cervical foraminal stenosis (C3-4 & C5-6) (Bilateral); DDD (degenerative disc disease), cervical; Spondylosis without myelopathy or radiculopathy, cervical region; Chronic hip pain (Bilateral) (L>R); Lumbar facet syndrome (Bilateral) (R>L); Spondylosis without myelopathy or radiculopathy, lumbar region; Chronic Sacroiliac joint dysfunction (Bilateral) (R>L); Chronic Somatic dysfunction of sacroiliac joint (Bilateral)  (R>L); Osteoarthritis of hip (Bilateral); Cervicogenic headache; Cervicalgia (Bilateral) (R>L); Cervical facet syndrome (Bilateral) (R>L); Chronic occipital neuralgia (Bilateral); Cervical spondylitis with radiculitis (HCC) (C6) (Bilateral) (R>L); Chronic sacroiliac joint pain (Bilateral) (R>L); Abnormal MRI, cervical spine (10/24/2023); Chronic low back pain (Bilateral) w/o sciatica; Anterior tibialis tendinitis, right; Radial styloid tenosynovitis of left hand; Pain in right hand; Bilateral carpal tunnel syndrome; Chronic pain of left wrist; Morton's neuroma of foot (Left); Chronic hip pain (Right); Impaired range of motion of hip (Right); Chronic lower extremity pain (Right); Lower extremity radicular pain (Right); Lumbar radiculitis (L3/L4) (Right); Cervical radiculopathy at C6 (Right); Pain in left wrist; Pain in right shoulder; Radial styloid tenosynovitis (de quervain); Swelling of joint of right shoulder; Chronic shoulder pain (Right); Radicular pain of shoulder (Right); Effusion of acromioclavicular joint; Localized swelling on right hand; Left wrist pain; History of headache; Rotator cuff tendinitis, right; and Cervical spondylosis with radiculopathy on their pertinent problem list.  Pain Assessment: Severity of   is reported as a  /10. Location:    / . Onset:  . Quality:  . Timing:  . Modifying factor(s):  SABRA Vitals:  vitals were not taken for this visit.  BMI: Estimated body mass index is 32.92 kg/m as calculated from the following:   Height as of 12/14/23: 5' 2 (1.575 m).   Weight as of 11/08/23: 180 lb (81.6 kg).  Last encounter: 10/24/2023. Last procedure: 10/30/2023.  Reason for encounter: post-procedure evaluation and assessment.   Discussed the use of AI scribe software for clinical note transcription with the patient, who gave verbal consent to proceed.  History of Present Illness          Post-Procedure Evaluation   Procedure: Cervical Epidural Steroid injection (CESI)  (Interlaminar) #4  Laterality: Right  Level: C7-T1 Imaging: Fluoroscopy-assisted DOS: 10/30/2023  Performed by: Eric Como, MD Anesthesia: Local anesthesia (  1-2% Lidocaine ) Anxiolysis: IV Versed  2.0 mg Sedation: Moderate Sedation Fentanyl  1 mL (50 mcg)   Purpose: Diagnostic/Therapeutic Indications: Cervicalgia, cervical radicular pain, degenerative disc disease, severe enough to impact quality of life or function. 1. Cervicalgia (Bilateral) (R>L)   2. Cervicogenic headache   3. Cervical spondylitis with radiculitis (HCC) (C6) (Bilateral) (R>L)   4. Cervical radiculopathy at C6 (Right)   5. Cervical Grade 1 (2 mm) Retrolisthesis C5 over C6   6. Cervical foraminal stenosis (C3-4 & C5-6) (Bilateral)   7. Cervical facet syndrome (Bilateral) (R>L)   8. DDD (degenerative disc disease), cervical   9. Chronic upper extremity pain (4th area of Pain) (Bilateral) (R>L)   10. Radicular pain of shoulder (Right)   11. Abnormal MRI, cervical spine (10/24/2023)    NAS-11 score:   Pre-procedure: 7 /10   Post-procedure: 0-No pain/10     Effectiveness:  Initial hour after procedure:   ***. Subsequent 4-6 hours post-procedure:   ***. Analgesia past initial 6 hours:   ***. Ongoing improvement:  Analgesic:  *** Function:    ***    ROM:    ***     Pharmacotherapy Assessment   Opioid Analgesic: No chronic opioid analgesics therapy prescribed by our practice.  Monitoring: [place] PMP: PDMP reviewed during this encounter.       Pharmacotherapy: No side-effects or adverse reactions reported. Compliance: No problems identified. Effectiveness: Clinically acceptable.  No notes on file  UDS:  Summary  Date Value Ref Range Status  11/08/2023 FINAL  Final    Comment:    ==================================================================== [employer] Select 13 (MW) ==================================================================== Test                             Result       Flag        Units  Drug Absent but Declared for Prescription Verification   Oxycodone                       Not Detected UNEXPECTED ng/mg creat   Butalbital                      Not Detected UNEXPECTED ==================================================================== Test                      Result    Flag   Units      Ref Range   Creatinine              174              mg/dL      >=79 ==================================================================== Declared Medications:  The flagging and interpretation on this report are based on the  following declared medications.  Unexpected results may arise from  inaccuracies in the declared medications.   **Note: The testing scope of this panel includes these medications:   Butalbital  (Fioricet )  Oxycodone  (Roxicodone )   **Note: The testing scope of this panel does not include the  following reported medications:   Acetaminophen  (Excedrin )  Acetaminophen  (Fioricet )  Amlodipine  (Norvasc )  Aspirin (Excedrin )  Caffeine  (Excedrin )  Caffeine  (Fioricet )  Dextromethorphan  Hydrochlorothiazide  (Maxzide )  Hydroxyzine  (Atarax )  Ipratropium (Atrovent )  Ketorolac  (Toradol )  Lansoprazole  (Prevacid )  Linaclotide  (Linzess )  Multivitamin  Ondansetron  (Zofran )  Promethazine  (Phenergan )  Rizatriptan  (Maxalt )  Triamterene  (Maxzide ) ==================================================================== For clinical consultation, please call [phone]. ====================================================================     No results found for: CBDTHCR No results found for: D8THCCBX  No results found for: D9THCCBX  ROS  Constitutional: Denies any fever or chills Gastrointestinal: No reported hemesis, hematochezia, vomiting, or acute GI distress Musculoskeletal: Denies any acute onset joint swelling, redness, loss of ROM, or weakness Neurological: No reported episodes of acute onset apraxia, aphasia, dysarthria, agnosia, amnesia, paralysis,  loss of coordination, or loss of consciousness  Medication Review  amLODipine , aspirin-acetaminophen -caffeine , butalbital -acetaminophen -caffeine , hydrOXYzine , ipratropium, ketorolac , lansoprazole , linaclotide , multivitamin with minerals, ondansetron , oxyCODONE , promethazine , promethazine -dextromethorphan, rizatriptan , and triamterene -hydrochlorothiazide   History Review  Allergy : Ms. Mooers is allergic to gluten meal, lactose, ranitidine, sulfa antibiotics, amphetamine-dextroamphetamine, lactose intolerance (gi), reglan [metoclopramide], topiramate  er, wheat, and soap. Drug: Ms. Parran  reports no history of drug use. Alcohol:  reports current alcohol use of about 2.0 - 4.0 standard drinks of alcohol per week. Tobacco:  reports that she quit smoking about 36 years ago. Her smoking use included cigarettes. She started smoking about 41 years ago. She has a 3.8 pack-year smoking history. She has never been exposed to tobacco smoke. She has never used smokeless tobacco. Social: Ms. Shartzer  reports that she quit smoking about 36 years ago. Her smoking use included cigarettes. She started smoking about 41 years ago. She has a 3.8 pack-year smoking history. She has never been exposed to tobacco smoke. She has never used smokeless tobacco. She reports current alcohol use of about 2.0 - 4.0 standard drinks of alcohol per week. She reports that she does not use drugs. Medical:  has a past medical history of Fatty liver, Fibromyalgia, GERD (gastroesophageal reflux disease), Hemorrhoids, Hot flashes, Hypertension, IBS (irritable bowel syndrome), LUQ abdominal pain (10/25/2016), Migraines, Miscarriage, Multilevel degenerative disc disease, Neuroma, and Vocal cord dysfunction. Surgical: Ms. Flinchum  has a past surgical history that includes Elbow Debridement; Foot surgery; Cholecystectomy; Colonoscopy; Abdominal hysterectomy; Esophagogastroduodenoscopy (egd) with propofol  (N/A, 02/06/2018); Colonoscopy with propofol  (N/A,  12/22/2019); Colonoscopy with propofol  (N/A, 12/23/2019); Image guided sinus surgery (N/A, 09/28/2021); Maxillary antrostomy (Bilateral, 09/28/2021); Ethmoidectomy (N/A, 09/28/2021); Sphenoidectomy (Bilateral, 09/28/2021); Colonoscopy with propofol  (N/A, 01/01/2023); and polypectomy (01/01/2023). Family: family history includes Alcohol abuse in her father and son; Arthritis in her son and son; Asthma in her sister; COPD in her father, paternal grandmother, and sister; Cancer in her paternal grandfather and sister; Colon cancer in her sister; Colon polyps in her sister; Depression in her son; Diabetes in her maternal grandmother, mother, and son; Drug abuse in her son; Glaucoma in her mother; Hyperlipidemia in her mother; Hypertension in her mother; Kidney disease in her sister; Mental illness in her sister and son; Miscarriages / Stillbirths in her sister and sister; Uterine cancer in her sister.  Laboratory Chemistry Profile   Renal Lab Results  Component Value Date   BUN 14 12/14/2023   CREATININE 0.95 12/14/2023   BCR 27 (H) 10/09/2023   GFRAA >60 07/07/2019   GFRNONAA >60 12/14/2023    Hepatic Lab Results  Component Value Date   AST 21 10/09/2023   ALT 41 (H) 10/09/2023   ALBUMIN 4.5 10/09/2023   ALKPHOS 84 10/09/2023   LIPASE 33 12/15/2016    Electrolytes Lab Results  Component Value Date   NA 141 12/14/2023   K 3.3 (L) 12/14/2023   CL 100 12/14/2023   CALCIUM 9.6 12/14/2023   MG 2.2 01/16/2018    Bone Lab Results  Component Value Date   25OHVITD1 51 01/16/2018   25OHVITD2 <1.0 01/16/2018   25OHVITD3 50 01/16/2018    Inflammation (CRP: Acute Phase) (ESR: Chronic Phase) Lab Results  Component Value Date  CRP 0.8 07/05/2021   ESRSEDRATE 17 07/05/2021         Note: Above Lab results reviewed.  Recent Imaging Review  DG Chest 2 View CLINICAL DATA:  Shortness of breath and swelling in the lower extremities.  EXAM: CHEST - 2 VIEW  COMPARISON:  Chest PA Lat  07/01/2021  FINDINGS: The heart size and mediastinal contours are within normal limits. There calcifications in the transverse aorta. Both lungs are clear. The visualized skeletal structures are intact with mild thoracic kyphosis and spondylosis.  IMPRESSION: No evidence of acute chest disease. Stable chest with aortic atherosclerosis.  Electronically Signed   By: Francis Quam M.D.   On: 12/14/2023 20:40 Note: Reviewed        Physical Exam  Vitals: There were no vitals taken for this visit. BMI: Estimated body mass index is 32.92 kg/m as calculated from the following:   Height as of 12/14/23: 5' 2 (1.575 m).   Weight as of 11/08/23: 180 lb (81.6 kg). Ideal: Patient weight not recorded General appearance: Well nourished, well developed, and well hydrated. In no apparent acute distress Mental status: Alert, oriented x 3 (person, place, & time)       Respiratory: No evidence of acute respiratory distress Eyes: PERLA   Assessment   Diagnosis Status  1. Cervicalgia (Bilateral) (R>L)   2. Cervicogenic headache   3. Chronic upper extremity pain (4th area of Pain) (Bilateral) (R>L)   4. Postop check    Controlled Controlled Controlled   Updated Problems: Problem  Disorder of Sacrum    Plan of Care  Problem-specific:  Assessment and Plan            Ms. Persephonie Eldena Dede has a current medication list which includes the following long-term medication(s): amlodipine , ipratropium, lansoprazole , linzess , oxycodone , promethazine , rizatriptan , and triamterene -hydrochlorothiazide .  Pharmacotherapy (Medications Ordered): No orders of the defined types were placed in this encounter.  Orders:  No orders of the defined types were placed in this encounter.    Interventional Therapies  Risk  Complexity Considerations:   Anxiety/Depression  GERD  HTN  IBS  Stage 3 CKD     Planned  Pending:      Under consideration:   Palliative/therapeutic right cervical ESI #5     Completed:   Therapeutic bilateral C2-3 and C3-4 cervical facet MBB by Dr. Marcelino (pain worsened)  Diagnostic/therapeutic right L3-4 LESI x1 (09/08/2021) (100/100/100/100)  Palliative bilateral cervical facet MBB x2 (06/13/2018) (100/100/0/<25)  Diagnostic bilateral lumbar facet MBB x2 (08/08/2018) [facility])  Diagnostic right Cervical ESI x4 (02/01/2023) (100/50/100/100)  Therapeutic right cervical facet RFA x1 (07/23/2018) (100/50/0) (No benefit)  (10/24/2023) referral to neurosurgery for cervical spine evaluation (Dr. Katrina) (scheduled for 11/01/2023)    Completed by other providers:   Therapeutic sphenopalatine ganglion Blk by Dr. Loreli x2 (03/04/2018) Diagnostic bilateral upper extremity EMG/PNCV by Dr. Loreli: Right Grade II & Left Grade I CTS (08/23/2018)    Therapeutic  Palliative (PRN) options:   Therapeutic/palliative cervical facet MBB   Therapeutic/palliativelumbar facet MBB   Therapeutic/palliative Cervical ESI   Therapeutic/palliative cervical facet RFA       No follow-ups on file.    Recent Visits Date Type Provider Dept  11/08/23 Office Visit Patel, Seema K, NP [facility]  10/30/23 Procedure visit Tanya Glisson, MD [facility]  10/01/23 Office Visit Tanya Glisson, MD [facility]  09/26/23 Procedure visit Marcelino Nurse, MD [facility]  09/19/23 Office Visit Tanya Glisson, MD [facility]  Showing recent visits within past 90 days and meeting all other requirements Future Appointments Date Type Provider Dept  12/19/23 Appointment Tanya Glisson, MD [facility]  Showing future appointments within next 90 days and meeting all other requirements  I discussed the assessment and treatment plan with the patient. The patient was provided an opportunity to ask questions and all were answered. The patient agreed with the plan and demonstrated an understanding of the instructions.  Patient advised to  call back or seek an in-person evaluation if the symptoms or condition worsens.  Duration of encounter: *** minutes.  Total time on encounter, as per AMA guidelines included both the face-to-face and non-face-to-face time personally spent by the physician and/or other qualified health care professional(s) on the day of the encounter (includes time in activities that require the physician or other qualified health care professional and does not include time in activities normally performed by clinical staff). Physician's time may include the following activities when performed: Preparing to see the patient (e.g., pre-charting review of records, searching for previously ordered imaging, lab work, and nerve conduction tests) Review of prior analgesic pharmacotherapies. Reviewing PMP Interpreting ordered tests (e.g., lab work, imaging, nerve conduction tests) Performing post-procedure evaluations, including interpretation of diagnostic procedures Obtaining and/or reviewing separately obtained history Performing a medically appropriate examination and/or evaluation Counseling and educating the patient/family/caregiver Ordering medications, tests, or procedures Referring and communicating with other health care professionals (when not separately reported) Documenting clinical information in the electronic or other health record Independently interpreting results (not separately reported) and communicating results to the patient/ family/caregiver Care coordination (not separately reported)  Note by: Glisson DELENA Tanya, MD (TTS and AI technology used. I apologize for any typographical errors that were not detected and corrected.) Date: 12/19/2023; Time: 4:46 PM

## 2023-12-18 NOTE — Telephone Encounter (Signed)
 Called to confirm/remind patient of their appointment at the [facility] on 12/19/23.   Appointment:   [x] Confirmed  [] Left mess   [] No answer/No voice mail  [] VM Full/unable to leave message  [] Phone not in service  Patient reminded to bring all medications and/or complete list.  Confirmed patient has transportation. Gave directions, instructed to utilize valet parking.

## 2023-12-19 ENCOUNTER — Ambulatory Visit: Admitting: Pain Medicine

## 2023-12-19 ENCOUNTER — Other Ambulatory Visit
Admission: RE | Admit: 2023-12-19 | Discharge: 2023-12-19 | Disposition: A | Discharge status: Home / Self Care | Source: Ambulatory Visit | Attending: Cardiology | Admitting: Cardiology

## 2023-12-19 ENCOUNTER — Encounter: Payer: Self-pay | Admitting: Pain Medicine

## 2023-12-19 ENCOUNTER — Ambulatory Visit: Admitting: Cardiology

## 2023-12-19 ENCOUNTER — Other Ambulatory Visit: Payer: Self-pay

## 2023-12-19 VITALS — BP 140/94 | HR 86 | Temp 99.2°F | Ht 62.0 in | Wt 180.0 lb

## 2023-12-19 VITALS — BP 119/75 | HR 75 | Wt 189.0 lb

## 2023-12-19 DIAGNOSIS — R600 Localized edema: Secondary | ICD-10-CM

## 2023-12-19 DIAGNOSIS — R0602 Shortness of breath: Secondary | ICD-10-CM | POA: Insufficient documentation

## 2023-12-19 DIAGNOSIS — R936 Abnormal findings on diagnostic imaging of limbs: Secondary | ICD-10-CM | POA: Insufficient documentation

## 2023-12-19 DIAGNOSIS — Z79891 Long term (current) use of opiate analgesic: Secondary | ICD-10-CM

## 2023-12-19 DIAGNOSIS — G4486 Cervicogenic headache: Secondary | ICD-10-CM

## 2023-12-19 DIAGNOSIS — G8929 Other chronic pain: Secondary | ICD-10-CM

## 2023-12-19 DIAGNOSIS — Z79899 Other long term (current) drug therapy: Secondary | ICD-10-CM | POA: Insufficient documentation

## 2023-12-19 DIAGNOSIS — M25511 Pain in right shoulder: Secondary | ICD-10-CM | POA: Diagnosis not present

## 2023-12-19 DIAGNOSIS — I10 Essential (primary) hypertension: Secondary | ICD-10-CM | POA: Insufficient documentation

## 2023-12-19 DIAGNOSIS — M542 Cervicalgia: Secondary | ICD-10-CM | POA: Diagnosis not present

## 2023-12-19 DIAGNOSIS — G894 Chronic pain syndrome: Secondary | ICD-10-CM

## 2023-12-19 DIAGNOSIS — M79601 Pain in right arm: Secondary | ICD-10-CM

## 2023-12-19 DIAGNOSIS — Z09 Encounter for follow-up examination after completed treatment for conditions other than malignant neoplasm: Secondary | ICD-10-CM

## 2023-12-19 DIAGNOSIS — M4310 Spondylolisthesis, site unspecified: Secondary | ICD-10-CM | POA: Insufficient documentation

## 2023-12-19 DIAGNOSIS — R937 Abnormal findings on diagnostic imaging of other parts of musculoskeletal system: Secondary | ICD-10-CM

## 2023-12-19 DIAGNOSIS — M4802 Spinal stenosis, cervical region: Secondary | ICD-10-CM

## 2023-12-19 DIAGNOSIS — G4489 Other headache syndrome: Secondary | ICD-10-CM | POA: Diagnosis not present

## 2023-12-19 DIAGNOSIS — M4312 Spondylolisthesis, cervical region: Secondary | ICD-10-CM

## 2023-12-19 DIAGNOSIS — M4722 Other spondylosis with radiculopathy, cervical region: Secondary | ICD-10-CM | POA: Diagnosis not present

## 2023-12-19 DIAGNOSIS — M5030 Other cervical disc degeneration, unspecified cervical region: Secondary | ICD-10-CM

## 2023-12-19 DIAGNOSIS — M79602 Pain in left arm: Secondary | ICD-10-CM | POA: Diagnosis not present

## 2023-12-19 DIAGNOSIS — R293 Abnormal posture: Secondary | ICD-10-CM | POA: Diagnosis not present

## 2023-12-19 LAB — BASIC METABOLIC PANEL WITH GFR
Anion gap: 11 (ref 5–15)
BUN: 18 mg/dL (ref 6–20)
CO2: 26 mmol/L (ref 22–32)
Calcium: 9.5 mg/dL (ref 8.9–10.3)
Chloride: 104 mmol/L (ref 98–111)
Creatinine, Ser: 1.06 mg/dL — ABNORMAL HIGH (ref 0.44–1.00)
GFR, Estimated: >60 mL/min (ref >60)
Glucose, Bld: 105 mg/dL — ABNORMAL HIGH (ref 70–99)
Potassium: 3.5 mmol/L (ref 3.5–5.1)
Sodium: 141 mmol/L (ref 135–145)

## 2023-12-19 LAB — BRAIN NATRIURETIC PEPTIDE: B Natriuretic Peptide: 10.8 pg/mL (ref 0.0–100.0)

## 2023-12-19 MED ORDER — FUROSEMIDE 20 MG PO TABS
ORAL_TABLET | ORAL | 0 refills | Status: DC
  Filled 2023-12-19: qty 4, 3d supply, fill #0

## 2023-12-19 MED ORDER — POTASSIUM CHLORIDE CRYS ER 20 MEQ PO TBCR
EXTENDED_RELEASE_TABLET | ORAL | 0 refills | Status: DC
  Filled 2023-12-19: qty 3, 3d supply, fill #0

## 2023-12-19 MED ORDER — OXYCODONE HCL 5 MG PO TABS
5.0000 mg | ORAL_TABLET | Freq: Three times a day (TID) | ORAL | 0 refills | Status: DC | PRN
Start: 2023-12-19 — End: 2024-02-07
  Filled 2023-12-19: qty 42, 14d supply, fill #0

## 2023-12-19 MED ORDER — NALOXONE HCL 4 MG/0.1ML NA LIQD
1.0000 | NASAL | 1 refills | Status: AC | PRN
  Filled 2023-12-19 (×2): qty 2, 15d supply, fill #0
  Filled 2023-12-19: qty 2, 30d supply, fill #0

## 2023-12-19 NOTE — Progress Notes (Unsigned)
   [facility] NOTE  Referring Physician: Waymond Lorelle Cummins, MD  Primary Care: Myrla Jon HERO, MD Primary Cardiologist: Heart Failure: Ria Commander, DO  CC: Evaluation for heart failure  HPI: Elizabeth Bowers is a 58 y.o. female with HTN, migraine headaches presenting today for follow up.   Pertinent Family & social hx:   Cardiac History:     Interval hx:  Reports that she has had lower extremity edema for the past 1 week. In addition, seh reports some shortness of breath      PHYSICAL EXAM: Vitals:   12/19/23 1524  BP: 119/75  Pulse: 75  SpO2: 98%   Lungs- *** CARDIAC:  JVP: *** cm          Normal rate with regular rhythm. *** murmur.  Pulses ***. *** edema.  ABDOMEN: soft EXTREMITIES: Warm and well perfused.   DATA REVIEW  ECG: ***  As per my personal interpretation  ECHO: ***  CATH: ***    ASSESSMENT & PLAN:  Evaluation for Heart failure - Hypervolemic on exam today with elevated JVP; 2+ pitting edema to the knees.  - Lasix  PO 40mg  tomorrow AM followed by 20mg  x1-2 days.   2. HTN - Maxzide  37-25 & amlodipine  10mg  daily  I spent *** minutes in the care of Elizabeth Bowers today including {CHL AMB CAR Time Based Billing Options STW (Optional):[phone]::documenting in the encounter.}   Elizabeth Bowers Advanced Heart Failure Mechanical Circulatory Support

## 2023-12-19 NOTE — Patient Instructions (Signed)
 Medication Changes:  TAKE LASIX  40 MG TOMORROW  THEN, TAKE LASIX  20 MG FOR 1-2 DAYS  TAKE POTASSIUM 20 MEQ    Lab Work:  Go over to the [facility]. Go pass the gift shop and have your blood work completed.  We will only call you if the results are abnormal or if the provider would like to make medication changes.   Testing/Procedures:  Please have your echo completed. You will check in for this at the [facility]. You have to arrive 15 MINS EARLY for preparation, otherwise you will have to reschedule. Please call Heron at [phone] to get this scheduled. She is gone for the day.   Follow-Up in: 1 WEEK WITH TINA HACKNEY, FNP with blood work. You can have this completed before your appointment.  At the [facility], you and your health needs are our priority. We have a designated team specialized in the treatment of Heart Failure. This Care Team includes your primary Heart Failure Specialized Cardiologist (physician), Advanced Practice Providers (APPs- Physician Assistants and Nurse Practitioners), and Pharmacist who all work together to provide you with the care you need, when you need it.   You may see any of the following providers on your designated Care Team at your next follow up:  Dr. Toribio Fuel Dr. Ezra Shuck Dr. Ria Commander Dr. Odis Brownie Ellouise Class, FNP Jaun Bash, RPH-CPP  Please be sure to bring in all your medications bottles to every appointment.   Need to Contact Us :  If you have any questions or concerns before your next appointment please send us  a message through [place] or call our office at [phone].    TO LEAVE A MESSAGE FOR THE NURSE SELECT OPTION 2, PLEASE LEAVE A MESSAGE INCLUDING: YOUR NAME DATE OF BIRTH CALL BACK NUMBER REASON FOR CALL**this is important as we prioritize the call backs  YOU WILL RECEIVE A CALL BACK THE SAME DAY AS LONG AS YOU CALL BEFORE 4:00 PM

## 2023-12-19 NOTE — Progress Notes (Signed)
 Nursing Pain Medication Assessment:  Safety precautions to be maintained throughout the outpatient stay will include: orient to surroundings, keep bed in low position, maintain call bell within reach at all times, provide assistance with transfer out of bed and ambulation.  Medication Inspection Compliance: Pill count conducted under aseptic conditions, in front of the patient. Neither the pills nor the bottle was removed from the patient's sight at any time. Once count was completed pills were immediately returned to the patient in their original bottle.  Medication: Oxycodone  IR Pill/Patch Count: 6 of 42 pills/patches remain Pill/Patch Appearance: Markings consistent with prescribed medication Bottle Appearance: Standard pharmacy container. Clearly labeled. Filled Date: 6 / 5 / 2025 Last Medication intake:  TodaySafety precautions to be maintained throughout the outpatient stay will include: orient to surroundings, keep bed in low position, maintain call bell within reach at all times, provide assistance with transfer out of bed and ambulation.

## 2023-12-19 NOTE — Patient Instructions (Signed)
 ______________________________________________________________________    TENS (Device can be purchased online, without prescription. Search: TENS 7000.) Transcutaneous electrical nerve stimulation (TENS) is a method of pain relief that involves the use of mild electrical stimulation. A TENS machine is a small, battery-operated device that has leads connected to sticky pads called electrodes. Available at [employer]. (Estimated price as of July 10th, 2025.)  (Estimated [employer] cost: $38.88) Rechargeable 9V batteries:  (Estimated [employer] cost: $12.98)  Larger Reusable 2 x 4 TENS Pads/Electrodes:  (Estimated [employer] cost: $9.99)  Total cost: $61.85    ELECTRODE PLACEMENT:   TENS UNIT SAFETY WARNING SHEET and INFORMATION INDICATIONS AND CONTRAINDICTIONS Read the operation manual before using the device. Freight forwarder ([place] ) restricts this device to sale by or on the order of a physician. Observe your physician's precise instructions and let him show you where to apply the electrodes. For a successful therapy, the correct application of the electrodes is an important factor. Carefully write down the settings your physician recommended. Indications for use This device is a prescription device and only for symptomatic relief of chronic intractable pain.  Contraindications:   Any electrode placement that applies current to the carotid sinus (neck) region.   Patients with implanted electronic devices (for example, a pacemaker) or metallic implants should not undertake.   Any electrode placement that causes current to flow transcerebrally (through the head). The use of unit whenever pain symptoms are undiagnosed, unit etiology is determined.   The use of TENS whenever pain syndromes are undiagnosed, until etiology is established.   WARNINGS AND PRECAUTIONS  Warnings:   The device must be kept out of reach of children.   The safety of device for use during pregnancy or delivery has not been established.    Do not place electrodes on front of the throat. This may result in spasms of the laryngeal and pharyngeal muscles.   Do not place the electrodes over the carotid nerve (side of neck below ear).   The device is not effective for pain of central origin (headaches).   The device may interfere with electronic monitoring equipment (such as ECG monitors and ECG alarms).   Electrodes should not be placed over the eyes, in the mouth, or internally.   These devices have no curative value.   TENS devices should be used only under the continued supervision of a physician.   TENS is a symptomatic treatment and as such suppresses the sensation of pain which would otherwise serve as a protective mechanism. Precautions/Adverse Reactions   Isolated cases of skin irritation may occur at the site of electrode placement following long-term application.   Stimulation should be stopped and electrodes removed until the cause of the irritation can be determined.   Effectiveness is highly dependent upon patient selection by a person qualified in the management of pain patients.   If the device treatment becomes ineffective or unpleasant, stimulation should be discontinued until reevaluation by a physician/clinician.   Always turn the device off before applying or removing electrodes.   Skin irritation and electrode burns are potential adverse reactions.  PURPOSE: A Transcutaneous Electrical Nerve Stimulator, or TENS, unit is designed to relieve post-operative, acute and chronic pain. It is used for pain caused by peripheral nerves and not central. TENS units are prescription-only devices.   OPERATION: TENS units work in a couple of ways. The first way they are thought to work is by a method called the [employer]. The [employer] states that our brains can only handle one  stimulus at a time. When you have chronic pain, this pain signal is constantly being sent to your brain and recognized as pain. When an electrical stimulus is added  to the area of pain the body feels this electrical stimulus, and since the brain can only handle one thing at a time, the pain is not transmitted to the brain. The second method thought to be part of TENS unit's success is by way of stimulating our own bodies to release their own natural painkillers. TENS units do not work for everyone and results may vary. Always follow the instructions and warnings in your user's manual.   USE: One of the most important tasks that must be performed is battery maintenance. If you are using a Engineering geologist, always fully charge it and fully deplete it before charging it again. These batteries can develop memories and by not performing this charging task correctly, your battery's life can be greatly diminished. If your battery does develop a memory you can help expand the memory by charging for 12 - 13 hours and then completely depleting the battery. Always prepare the skin before applying electrodes. Your skin should be clean and free of any lotions or creams. If you are using electrodes that use conductive gel, apply a small, even layer over the electrode. For carbon, self-adhesive electrodes, apply a drop of water to the electrodes before applying to the skin. The electrodes attach to the lead wires and then the TENS unit. Always grasp the connector and not the cord when inserting or removing. When making adjustments, always make sure the unit's channels (1 and 2) are in the OFF position. The actual settings should be recommended and prescribed by your physician. Medical equipment suppliers don'tset or instruct users as to user settings. When you are using the BURST mode, the unit delivers a series of quick pulses followed by a rest. This cycle repeats itself frequently. Always have channels OFF before changing modes.   For MODULATION mode, the stimulation automatically varies the width of the pulse.   For CONVENTIONAL mode, the stimulation is constant.  After the settings have been fine-tuned, set the timer to 30 or 60 minutes. Your physician should also prescribe the use time. When the lights become dim, it means your batteries should be replaced or recharged.   ACCESSORIES: The electrodes and lead wires can be obtained from your medical equipment supplier. Your medical equipment supplier can set up a recurring delivery to accommodate your needs. Electrodes should be replaced once a month and lead wires once every 6 months.  Video Tutorial [email or web address]://[email or web address]  ______________________________________________________________________      ______________________________________________________________________    Opioid Pain Medication Update  To: All patients taking opioid pain medications. (I.e.: hydrocodone , hydromorphone , oxycodone , oxymorphone, morphine, codeine , methadone, tapentadol, tramadol , buprenorphine, fentanyl , etc.)  Re: Updated review of side effects and adverse reactions of opioid analgesics, as well as new information about long term effects of this class of medications.  Direct risks of long-term opioid therapy are not limited to opioid addiction and overdose. Potential medical risks include serious fractures, breathing problems during sleep, hyperalgesia, immunosuppression, chronic constipation, bowel obstruction, myocardial infarction, and tooth decay secondary to xerostomia.  Unpredictable adverse effects that can occur even if you take your medication correctly: Cognitive impairment, respiratory depression, and death. Most people think that if they take their medication correctly, and as instructed, that they will be safe. Nothing could be farther from the truth. In reality, a significant amount of recorded deaths associated  with the use of opioids has occurred in individuals that had taken the medication for a long time, and were taking their medication correctly. The following are examples of  how this can happen: Patient taking his/her medication for a long time, as instructed, without any side effects, is given a certain antibiotic or another unrelated medication, which in turn triggers a Drug-to-drug interaction leading to disorientation, cognitive impairment, impaired reflexes, respiratory depression or an untoward event leading to serious bodily harm or injury, including death.  Patient taking his/her medication for a long time, as instructed, without any side effects, develops an acute impairment of liver and/or kidney function. This will lead to a rapid inability of the body to breakdown and eliminate their pain medication, which will result in effects similar to an overdose, but with the same medicine and dose that they had always taken. This again may lead to disorientation, cognitive impairment, impaired reflexes, respiratory depression or an untoward event leading to serious bodily harm or injury, including death.  A similar problem will occur with patients as they grow older and their liver and kidney function begins to decrease as part of the aging process.  Background information: Historically, the original case for using long-term opioid therapy to treat chronic noncancer pain was based on safety assumptions that subsequent experience has called into question. In 1996, the American Pain Society and the American Academy of Pain Medicine issued a consensus statement supporting long-term opioid therapy. This statement acknowledged the dangers of opioid prescribing but concluded that the risk for addiction was low; respiratory depression induced by opioids was short-lived, occurred mainly in opioid-naive patients, and was antagonized by pain; tolerance was not a common problem; and efforts to control diversion should not constrain opioid prescribing. This has now proven to be wrong. Experience regarding the risks for opioid addiction, misuse, and overdose in community practice has  failed to support these assumptions.  According to the Centers for Disease Control and Prevention, fatal overdoses involving opioid analgesics have increased sharply over the past decade. Currently, more than 96,700 people die from drug overdoses every year. Opioids are a factor in 7 out of every 10 overdose deaths. Deaths from drug overdose have surpassed motor vehicle accidents as the leading cause of death for individuals between the ages of 20 and 65.  Clinical data suggest that neuroendocrine dysfunction may be very common in both men and women, potentially causing hypogonadism, erectile dysfunction, infertility, decreased libido, osteoporosis, and depression. Recent studies linked higher opioid dose to increased opioid-related mortality. Controlled observational studies reported that long-term opioid therapy may be associated with increased risk for cardiovascular events. Subsequent meta-analysis concluded that the safety of long-term opioid therapy in elderly patients has not been proven.   Side Effects and adverse reactions: Common side effects: Drowsiness (sedation). Dizziness. Nausea and vomiting. Constipation. Physical dependence -- Dependence often manifests with withdrawal symptoms when opioids are discontinued or decreased. Tolerance -- As you take repeated doses of opioids, you require increased medication to experience the same effect of pain relief. Respiratory depression -- This can occur in healthy people, especially with higher doses. However, people with COPD, asthma or other lung conditions may be even more susceptible to fatal respiratory impairment.  Uncommon side effects: An increased sensitivity to feeling pain and extreme response to pain (hyperalgesia). Chronic use of opioids can lead to this. Delayed gastric emptying (the process by which the contents of your stomach are moved into your small intestine). Muscle rigidity. Immune system and hormonal dysfunction.  Quick,  involuntary muscle jerks (myoclonus). Arrhythmia. Itchy skin (pruritus). Dry mouth (xerostomia).  Long-term side effects: Chronic constipation. Sleep-disordered breathing (SDB). Increased risk of bone fractures. Hypothalamic-pituitary-adrenal dysregulation. Increased risk of overdose.  RISKS: Respiratory depression and death: Opioids increase the risk of respiratory depression and death.  Drug-to-drug interactions: Opioids are relatively contraindicated in combination with benzodiazepines, sleep inducers, and other central nervous system depressants. Other classes of medications (i.e.: certain antibiotics and even over-the-counter medications) may also trigger or induce respiratory depression in some patients.  Medical conditions: Patients with pre-existing respiratory problems are at higher risk of respiratory failure and/or depression when in combination with opioid analgesics. Opioids are relatively contraindicated in some medical conditions such as central sleep apnea.   Fractures and Falls:  Opioids increase the risk and incidence of falls. This is of particular importance in elderly patients.  Endocrine System:  Long-term administration is associated with endocrine abnormalities (endocrinopathies). (Also known as Opioid-induced Endocrinopathy) Influences on both the hypothalamic-pituitary-adrenal axis?and the hypothalamic-pituitary-gonadal axis have been demonstrated with consequent hypogonadism and adrenal insufficiency in both sexes. Hypogonadism and decreased levels of dehydroepiandrosterone sulfate have been reported in men and women. Endocrine effects include: Amenorrhoea in women (abnormal absence of menstruation) Reduced libido in both sexes Decreased sexual function Erectile dysfunction in men Hypogonadisms (decreased testicular function with shrinkage of testicles) Infertility Depression and fatigue Loss of muscle mass Anxiety Depression Immune  suppression Hyperalgesia Weight gain Anemia Osteoporosis Patients (particularly women of childbearing age) should avoid opioids. There is insufficient evidence to recommend routine monitoring of asymptomatic patients taking opioids in the long-term for hormonal deficiencies.  Immune System: Human studies have demonstrated that opioids have an immunomodulating effect. These effects are mediated via opioid receptors both on immune effector cells and in the central nervous system. Opioids have been demonstrated to have adverse effects on antimicrobial response and anti-tumour surveillance. Buprenorphine has been demonstrated to have no impact on immune function.  Opioid Induced Hyperalgesia: Human studies have demonstrated that prolonged use of opioids can lead to a state of abnormal pain sensitivity, sometimes called opioid induced hyperalgesia (OIH). Opioid induced hyperalgesia is not usually seen in the absence of tolerance to opioid analgesia. Clinically, hyperalgesia may be diagnosed if the patient on long-term opioid therapy presents with increased pain. This might be qualitatively and anatomically distinct from pain related to disease progression or to breakthrough pain resulting from development of opioid tolerance. Pain associated with hyperalgesia tends to be more diffuse than the pre-existing pain and less defined in quality. Management of opioid induced hyperalgesia requires opioid dose reduction.  Cancer: Chronic opioid therapy has been associated with an increased risk of cancer among noncancer patients with chronic pain. This association was more evident in chronic strong opioid users. Chronic opioid consumption causes significant pathological changes in the small intestine and colon. Epidemiological studies have found that there is a link between opium dependence and initiation of gastrointestinal cancers. Cancer is the second leading cause of death after cardiovascular disease.  Chronic use of opioids can cause multiple conditions such as GERD, immunosuppression and renal damage as well as carcinogenic effects, which are associated with the incidence of cancers.   Mortality: Long-term opioid use has been associated with increased mortality among patients with chronic non-cancer pain (CNCP).  Prescription of long-acting opioids for chronic noncancer pain was associated with a significantly increased risk of all-cause mortality, including deaths from causes other than overdose.  Reference: Von Korff M, Kolodny A, Deyo RA, Chou R. Long-term opioid therapy reconsidered. Jenkins  Intern Med. 2011 Sep 6;155(5):325-8. doi: 10.7326/[id number]. PMID: 78106373; PMCID: [id number]. Kit JINNY Laurence CINDERELLA Pearley JINNY, Hayward RA, Dunn KM, [place] KP. Risk of adverse events in patients prescribed long-term opioids: A cohort study in the [employer]. Eur J Pain. 2019 May;23(5):908-922. doi: 10.1002/ejp.1357. Epub 2019 Jan 31. PMID: 69379883. Colameco S, Coren JS, Ciervo CA. Continuous opioid treatment for chronic noncancer pain: a time for moderation in prescribing. Postgrad Med. 2009 Jul;121(4):61-6. doi: 10.3810/pgm.2009.07.2032. PMID: 80358728. Gigi JONELLE Shlomo MILUS Levern IVER Conny RN, Custar SD, Blazina I, Lonell DASEN, Bougatsos C, Deyo RA. The effectiveness and risks of long-term opioid therapy for chronic pain: a systematic review for a [employer] of [employer] Pathways to [employer]. Ann Intern Med. 2015 Feb 17;162(4):276-86. doi: 10.7326/M14-2559. PMID: 74418742. Rory CHRISTELLA Laurence [facility], [place] DM. NCHS Data Brief No. 22. [place]: Centers for Disease Control and Prevention; 2009. Sep, Increase in Fatal Poisonings Involving Opioid Analgesics in the [place] , 1999-2006. Song IA, Choi HR, Oh TK. Long-term opioid use and mortality in patients with chronic non-cancer pain: Ten-year follow-up study in [place] from 2010 through 2019.  EClinicalMedicine. 2022 Jul 18;51:101558. doi: 10.1016/j.eclinm.2022.898441. PMID: 64124182; PMCID: EFR0695089. Huser, W., Schubert, T., Vogelmann, T. et al. All-cause mortality in patients with long-term opioid therapy compared with non-opioid analgesics for chronic non-cancer pain: a database study. BMC Med 18, 162 (2020). http://lester.info/ Rashidian H, Zendehdel K, Kamangar F, Malekzadeh R, Haghdoost AA. An Ecological Study of the Association between Opiate Use and Incidence of Cancers. Addict Health. 2016 Fall;8(4):252-260. PMID: 71180443; PMCID: EFR4445194.  Our Goal: Our goal is to control your pain with means other than the use of opioid pain medications.  Our Recommendation: Talk to your physician about coming off of these medications. We can assist you with the tapering down and stopping these medicines. Based on the new information, even if you cannot completely stop the medication, a decrease in the dose may be associated with a lesser risk. Ask for other means of controlling the pain. Decrease or eliminate those factors that significantly contribute to your pain such as smoking, obesity, and a diet heavily tilted towards inflammatory nutrients.  Last Updated: 12/11/2022   ______________________________________________________________________       ______________________________________________________________________    National Pain Medication Shortage  The [place] is experiencing worsening drug shortages. These have had a negative widespread effect on patient care and treatment. Not expected to improve any time soon. Predicted to last past 2029.   Drug shortage list (generic names) Oxycodone  IR Oxycodone /APAP Oxymorphone IR Hydromorphone  Hydrocodone /APAP Morphine  Where is the problem?  Manufacturing and supply level.  Will this shortage affect you?  Only if you take any of the above pain medications.  How? You may be unable to fill your  prescription.  Your pharmacist may offer a partial fill of your prescription. (Warning: Do not accept partial fills.) Prescriptions partially filled cannot be transferred to another pharmacy. Read our Medication Rules and Regulation. Depending on how much medicine you are dependent on, you may experience withdrawals when unable to get the medication.  Recommendations: Consider ending your dependence on opioid pain medications. Ask your pain specialist to assist you with the process. Consider switching to a medication currently not in shortage, such as Buprenorphine. Talk to your pain specialist about this option. Consider decreasing your pain medication requirements by managing tolerance thru Drug Holidays. This may help minimize withdrawals, should you run out of medicine. Control your pain thru the use of non-pharmacological interventional therapies.  Your prescriber: Prescribers cannot be blamed for shortages. Medication manufacturing and supply issues cannot be fixed by the prescriber.   NOTE: The prescriber is not responsible for supplying the medication, or solving supply issues. Work with your pharmacist to solve it. The patient is responsible for the decision to take or continue taking the medication and for identifying and securing a legal supply source. By law, supplying the medication is the job and responsibility of the pharmacy. The prescriber is responsible for the evaluation, monitoring, and prescribing of these medications.   Prescribers will NOT: Re-issue prescriptions that have been partially filled. Re-issue prescriptions already sent to a pharmacy.  Re-send prescriptions to a different pharmacy because yours did not have your medication. Ask pharmacist to order more medicine or transfer the prescription to another pharmacy. (Read below.)  New 2023 regulation: February 03, 2022 Revised Regulation Allows DEA-Registered Pharmacies to Transfer Electronic Prescriptions at  a Patient's Request DEA Headquarters Division - Public Information Office Patients now have the ability to request their electronic prescription be transferred to another pharmacy without having to go back to their practitioner to initiate the request. This revised regulation went into effect on Monday, January 30, 2022.     At a patient's request, a DEA-registered retail pharmacy can now transfer an electronic prescription for a controlled substance (schedules II-V) to another DEA-registered retail pharmacy. Prior to this change, patients would have to go through their practitioner to cancel their prescription and have it re-issued to a different pharmacy. The process was taxing and time consuming for both patients and practitioners.    The Drug Enforcement Administration [facility]) published its intent to revise the process for transferring electronic prescriptions on April 23, 2020.  The final rule was published in the federal register on December 29, 2021 and went into effect 30 days later.  Under the final rule, a prescription can only be transferred once between pharmacies, and only if allowed under existing state or other applicable law. The prescription must remain in its electronic form; may not be altered in any way; and the transfer must be communicated directly between two licensed pharmacists. It's important to note, any authorized refills transfer with the original prescription, which means the entire prescription will be filled at the same pharmacy.  Reference: HugeHand.is [facility] website announcement)  CheapWipes.at.pdf Financial planner of Justice)   [employer] / Vol. 88, No. 143 / Thursday, December 29, 2021 / Rules and Regulations DEPARTMENT OF JUSTICE  Drug Enforcement Administration  21 CFR Part 1306  [Docket No. [id number]]   RIN [id number] Transfer of Electronic Prescriptions for Schedules II-V Controlled Substances Between Pharmacies for Initial Filling  ______________________________________________________________________       ______________________________________________________________________    Transfer of Pain Medication between Pharmacies  Re: 2023 DEA Clarification on existing regulation  Published on DEA Website: February 03, 2022  Title: Revised Regulation Allows DEA-Registered Pharmacies to Electrical engineer Prescriptions at a Patient's Request DEA Headquarters Division - [employer]  Patients now have the ability to request their electronic prescription be transferred to another pharmacy without having to go back to their practitioner to initiate the request. This revised regulation went into effect on Monday, January 30, 2022.     At a patient's request, a DEA-registered retail pharmacy can now transfer an electronic prescription for a controlled substance (schedules II-V) to another DEA-registered retail pharmacy. Prior to this change, patients would have to go through their practitioner to cancel their prescription and have it re-issued to  a different pharmacy. The process was taxing and time consuming for both patients and practitioners.    The Drug Enforcement Administration [facility]) published its intent to revise the process for transferring electronic prescriptions on April 23, 2020.  The final rule was published in the federal register on December 29, 2021 and went into effect 30 days later.  Under the final rule, a prescription can only be transferred once between pharmacies, and only if allowed under existing state or other applicable law. The prescription must remain in its electronic form; may not be altered in any way; and the transfer must be communicated directly between two licensed pharmacists. It's important to note, any authorized refills transfer with the original  prescription, which means the entire prescription will be filled at the same pharmacy.    REFERENCES: 1. DEA website announcement [email or web address]  2. Department of Justice website  [email or web address]  3. DEPARTMENT OF JUSTICE Drug Enforcement Administration 21 CFR Part 1306 [Docket No. [id number]-637] RIN [id number] Transfer of Electronic Prescriptions for Schedules II-V Controlled Substances Between Pharmacies for Initial Filling  ______________________________________________________________________       ______________________________________________________________________    Medication Rules  Purpose: To inform patients, and their family members, of our medication rules and regulations.  Applies to: All patients receiving prescriptions from our practice (written or electronic).  Pharmacy of record: This is the pharmacy where your electronic prescriptions will be sent. Make sure we have the correct one.  Electronic prescriptions: In compliance with the [place]  Strengthen Opioid Misuse Prevention (STOP) Act of 2017 (Session Law 2017-74/H243), effective June 05, 2018, all controlled substances must be electronically prescribed. Written prescriptions, faxing, or calling prescriptions to a pharmacy will no longer be done.  Prescription refills: These will be provided only during in-person appointments. No medications will be renewed without a face-to-face evaluation with your provider. Applies to all prescriptions.  NOTE: The following applies primarily to controlled substances (Opioid* Pain Medications).   Type of encounter (visit): For patients receiving controlled substances, face-to-face visits are required. (Not an option and not up to the patient.)  Patient's Responsibilities: Pain Pills: Bring all pain pills to every  appointment (except for procedure appointments). Pill counts are required.  Pill Bottles: Bring pills in original pharmacy bottle. Bring bottle, even if empty. Always bring the bottle of the most recent fill.  Medication refills: You are responsible for knowing and keeping track of what medications you are taking and when is it that you will need a refill. The day before your appointment: write a list of all prescriptions that need to be refilled. The day of the appointment: give the list to the admitting nurse. Prescriptions will be written only during appointments. No prescriptions will be written on procedure days. If you forget a medication: it will not be Called in, Faxed, or electronically sent. You will need to get another appointment to get these prescribed. No early refills. Do not call asking to have your prescription filled early. Partial  or short prescriptions: Occasionally your pharmacy may not have enough pills to fill your prescription.  NEVER ACCEPT a partial fill or a prescription that is short of the total amount of pills that you were prescribed.  With controlled substances the law allows 72 hours for the pharmacy to complete the prescription.  If the prescription is not completed within 72 hours, the pharmacist will require a new prescription to be written. This means that you will be short on your medicine and we WILL NOT send another prescription  to complete your original prescription.  Instead, request the pharmacy to send a carrier to a nearby branch to get enough medication to provide you with your full prescription. Prescription Accuracy: You are responsible for carefully inspecting your prescriptions before leaving our office. Have the discharge nurse carefully go over each prescription with you, before taking them home. Make sure that your name is accurately spelled, that your address is correct. Check the name and dose of your medication to make sure it is accurate. Check  the number of pills, and the written instructions to make sure they are clear and accurate. Make sure that you are given enough medication to last until your next medication refill appointment. Taking Medication: Take medication as prescribed. When it comes to controlled substances, taking less pills or less frequently than prescribed is permitted and encouraged. Never take more pills than instructed. Never take the medication more frequently than prescribed.  Inform other Doctors: Always inform, all of your healthcare providers, of all the medications you take. Pain Medication from other Providers: You are not allowed to accept any additional pain medication from any other Doctor or Healthcare provider. There are two exceptions to this rule. (see below) In the event that you require additional pain medication, you are responsible for notifying us , as stated below. Cough Medicine: Often these contain an opioid, such as codeine  or hydrocodone . Never accept or take cough medicine containing these opioids if you are already taking an opioid* medication. The combination may cause respiratory failure and death. Medication Agreement: You are responsible for carefully reading and following our Medication Agreement. This must be signed before receiving any prescriptions from our practice. Safely store a copy of your signed Agreement. Violations to the Agreement will result in no further prescriptions. (Additional copies of our Medication Agreement are available upon request.) Laws, Rules, & Regulations: All patients are expected to follow all [address] and [employer], Rules, [employer]. Ignorance of the Laws does not constitute a valid excuse.  Illegal drugs and Controlled Substances: The use of illegal substances (including, but not limited to marijuana and its derivatives) and/or the illegal use of any controlled substances is strictly prohibited. Violation of this rule may result in the immediate and  permanent discontinuation of any and all prescriptions being written by our practice. The use of any illegal substances is prohibited. Adopted CDC guidelines & recommendations: Target dosing levels will be at or below 60 MME/day. Use of benzodiazepines** is not recommended. Urine Drug testing: Patients taking controlled substances will be required to provide a urine sample upon request. Do not void before coming to your medication management appointments. Hold emptying your bladder until you are admitted. The admitting nurse will inform you if a sample is required. Our practice reserves the right to call you at any time to provide a sample. Once receiving the call, you have 24 hours to comply with request. Not providing a sample upon request may result in termination of medication therapy.  Exceptions: There are only two exceptions to the rule of not receiving pain medications from other Healthcare Providers. Exception #1 (Emergencies): In the event of an emergency (i.e.: accident requiring emergency care), you are allowed to receive additional pain medication. However, you are responsible for: As soon as you are able, call our office [phone], at any time of the day or night, and leave a message stating your name, the date and nature of the emergency, and the name and dose of the medication prescribed. In the  event that your call is answered by a member of our staff, make sure to document and save the date, time, and the name of the person that took your information.  Exception #2 (Planned Surgery): In the event that you are scheduled by another doctor or dentist to have any type of surgery or procedure, you are allowed (for a period no longer than 30 days), to receive additional pain medication, for the acute post-op pain. However, in this case, you are responsible for picking up a copy of our Post-op Pain Management for Surgeons handout, and giving it to your surgeon or dentist. This document is  available at our office, and does not require an appointment to obtain it. Simply go to our office during business hours (Monday-Thursday from 8:00 AM to 4:00 PM) (Friday 8:00 AM to 12:00 Noon) or if you have a scheduled appointment with us , prior to your surgery, and ask for it by name. In addition, you are responsible for: calling our office [phone], at any time of the day or night, and leaving a message stating your name, name of your surgeon, type of surgery, and date of procedure or surgery. Failure to comply with your responsibilities may result in termination of therapy involving the controlled substances.  Consequences:  Non-compliance with the above rules may result in permanent discontinuation of medication prescription therapy. All patients receiving any type of controlled substance is expected to comply with the above patient responsibilities. Not doing so may result in permanent discontinuation of medication prescription therapy. Medication Agreement Violation. Following the above rules, including your responsibilities will help you in avoiding a Medication Agreement Violation ("Breaking your Pain Medication Contract").  *Opioid medications include: morphine, codeine , oxycodone , oxymorphone, hydrocodone , hydromorphone , meperidine, tramadol , tapentadol, buprenorphine, fentanyl , methadone. **Benzodiazepine medications include: diazepam  (Valium ), alprazolam (Xanax), clonazepam (Klonopine), lorazepam (Ativan), clorazepate (Tranxene), chlordiazepoxide (Librium), estazolam (Prosom), oxazepam (Serax), temazepam (Restoril), triazolam (Halcion) (Last updated: 03/28/2023) ______________________________________________________________________      ______________________________________________________________________    Medication Recommendations and Reminders  Applies to: All patients receiving prescriptions (written and/or electronic).  Medication Rules & Regulations: You are  responsible for reading, knowing, and following our Medication Rules document. These exist for your safety and that of others. They are not flexible and neither are we. Dismissing or ignoring them is an act of non-compliance that may result in complete and irreversible termination of such medication therapy. For safety reasons, non-compliance will not be tolerated. As with the [place] fundamental legal principle of ignorance of the law is no defense, we will accept no excuses for not having read and knowing the content of documents provided to you by our practice.  Pharmacy of record:  Definition: This is the pharmacy where your electronic prescriptions will be sent.  We do not endorse any particular pharmacy. It is up to you and your insurance to decide what pharmacy to use.  We do not restrict you in your choice of pharmacy. However, once we write for your prescriptions, we will NOT be re-sending more prescriptions to fix restricted supply problems created by your pharmacy, or your insurance.  The pharmacy listed in the electronic medical record should be the one where you want electronic prescriptions to be sent. If you choose to change pharmacy, simply notify our nursing staff. Changes will be made only during your regular appointments and not over the phone.  Recommendations: Keep all of your pain medications in a safe place, under lock and key, even if you live alone. We will NOT replace  lost, stolen, or damaged medication. We do not accept Police Reports as proof of medications having been stolen. After you fill your prescription, take 1 week's worth of pills and put them away in a safe place. You should keep a separate, properly labeled bottle for this purpose. The remainder should be kept in the original bottle. Use this as your primary supply, until it runs out. Once it's gone, then you know that you have 1 week's worth of medicine, and it is time to come in for a prescription refill. If  you do this correctly, it is unlikely that you will ever run out of medicine. To make sure that the above recommendation works, it is very important that you make sure your medication refill appointments are scheduled at least 1 week before you run out of medicine. To do this in an effective manner, make sure that you do not leave the office without scheduling your next medication management appointment. Always ask the nursing staff to show you in your prescription , when your medication will be running out. Then arrange for the receptionist to get you a return appointment, at least 7 days before you run out of medicine. Do not wait until you have 1 or 2 pills left, to come in. This is very poor planning and does not take into consideration that we may need to cancel appointments due to bad weather, sickness, or emergencies affecting our staff. DO NOT ACCEPT A Partial Fill: If for any reason your pharmacy does not have enough pills/tablets to completely fill or refill your prescription, do not allow for a partial fill. The law allows the pharmacy to complete that prescription within 72 hours, without requiring a new prescription. If they do not fill the rest of your prescription within those 72 hours, you will need a separate prescription to fill the remaining amount, which we will NOT provide. If the reason for the partial fill is your insurance, you will need to talk to the pharmacist about payment alternatives for the remaining tablets, but again, DO NOT ACCEPT A PARTIAL FILL, unless you can trust your pharmacist to obtain the remainder of the pills within 72 hours.  Prescription refills and/or changes in medication(s):  Prescription refills, and/or changes in dose or medication, will be conducted only during scheduled medication management appointments. (Applies to both, written and electronic prescriptions.) No refills on procedure days. No medication will be changed or started on procedure days. No  changes, adjustments, and/or refills will be conducted on a procedure day. Doing so will interfere with the diagnostic portion of the procedure. No phone refills. No medications will be called into the pharmacy. No Fax refills. No weekend refills. No Holliday refills. No after hours refills.  Remember:  Business hours are:  Monday to Thursday 8:00 AM to 4:00 PM Provider's Schedule: Eric Como, MD - Appointments are:  Medication management: Monday and Wednesday 8:00 AM to 4:00 PM Procedure day: Tuesday and Thursday 7:30 AM to 4:00 PM Wallie Sherry, MD - Appointments are:  Medication management: Tuesday and Thursday 8:00 AM to 4:00 PM Procedure day: Monday and Wednesday 7:30 AM to 4:00 PM (Last update: 03/28/2022) ______________________________________________________________________      ______________________________________________________________________    Drug Holidays  What is a Drug Holiday? Drug Holiday: is the name given to the process of slowly tapering down and temporarily stopping the pain medication for the purpose of decreasing or eliminating tolerance to the drug.  Benefits Improved effectiveness Decreased required effective dose Improved pain  control End dependence on high dose therapy Decrease cost of therapy Uncovering opioid-induced hyperalgesia. (OIH)  What is opioid hyperalgesia? It is a paradoxical increase in pain caused by exposure to opioids. Stopping the opioid pain medication, contrary to the expected, it actually decreases or completely eliminates the pain. Ref.: A comprehensive review of opioid-induced hyperalgesia. Marcos Ruth, et.al. Pain Physician. 2011 Mar-Apr;14(2):145-61.  What is tolerance? Tolerance: the progressive loss of effectiveness of a pain medicine due to repetitive use. A common problem of opioid pain medications.  How long should a Drug Holiday last? Effectiveness depends on the patient staying off all opioid  pain medicines for a minimum of 14 consecutive days. (2 weeks) During this time the patient should not be taking any other opioid analgesic medication.  How about just taking less of the medicine? Does not work. Will not accomplish goal of eliminating the excess receptors.  How about switching to a different pain medicine? (AKA. Opioid rotation) Does not work. Creates the illusion of effectiveness by taking advantage of inaccurate equivalent dose calculations between different opioids. -This technique was promoted by studies funded by [employer], such as [employer], creators of OxyContin .  Can I stop the medicine cold [place]? We do not recommend it. You should always coordinate with your prescribing physician to make the transition as smoothly as possible. Avoid stopping the medicine abruptly without consulting. We recommend a slow taper.  What is a slow taper? Taper: refers to the gradual decrease in dose.   How do I stop/taper the dose? Slowly. Decrease the daily amount of pills that you take by one (1) pill every seven (7) days. This is called a slow downward taper. Example: if you normally take four (4) pills per day, drop it to three (3) pills per day for seven (7) days, then to two (2) pills per day for seven (7) days, then to one (1) per day for seven (7) days, and then stop the medicine. The 14 day Drug Holiday starts on the first day without medicine.   Will I experience withdrawals? Unlikely with a slow taper.  What triggers withdrawals? Withdrawals are triggered by the sudden/abrupt stop of high dose opioids. Withdrawals can be avoided by slowly decreasing the dose over a prolonged period of time.  What are withdrawals? Symptoms associated with sudden/abrupt reduction/stopping of high-dose, long-term use of pain medication. Withdrawal are seldom seen on low dose therapy, or patients rarely taking opioid medication.  Early Withdrawal Symptoms may  include: Agitation Anxiety Muscle aches Increased tearing Insomnia Runny nose Sweating Yawning  Late symptoms may include: Abdominal cramping Diarrhea Dilated pupils Goose bumps Nausea Vomiting  When could I see withdrawals? Onset: 8-24 hours after last use for most opioids. 12-48 hours for long-acting opioids (i.e.: methadone)  How long could they last? Duration: 4-10 days for most opioids. 14-21 days for long-acting opioids (i.e.: methadone)  What will happen after I complete my Drug Holiday? The need and indications for the opioid analgesic will be reviewed before restarting the medication. Dose requirements will likely decrease and the dose will need to be adjusted accordingly.   (Last update: 03/28/2023) ______________________________________________________________________      ______________________________________________________________________    WARNING: CBD (cannabidiol) & Delta (Delta-8 tetrahydrocannabinol) products.   Applicable to:  All individuals currently taking or considering taking CBD (cannabidiol) and, more important, all patients taking opioid analgesic controlled substances (pain medication). (Example: oxycodone ; oxymorphone; hydrocodone ; hydromorphone ; morphine; methadone; tramadol ; tapentadol; fentanyl ; buprenorphine; butorphanol; dextromethorphan; meperidine; codeine ; etc.)  Introduction:  Recently  there has been a drive towards the use of natural products for the treatment of different conditions, including pain anxiety and sleep disorders. Marijuana and hemp are two varieties of the cannabis genus plants. Marijuana and its derivatives are illegal, while hemp and its derivatives are not. Cannabidiol (CBD) and tetrahydrocannabinol (THC), are two natural compounds found in plants of the Cannabis genus. They can both be extracted from hemp or marijuana. Both compounds interact with your body's endocannabinoid system in very different ways. CBD is  associated with pain relief (analgesia) while THC is associated with the psychoactive effects (the high) obtained from the use of marijuana products. There are two main types of THC: Delta-9, which comes from the marijuana plant and it is illegal, and Delta-8, which comes from the hemp plant, and it is legal. (Both, Delta-9-THC and Delta-8-THC are psychoactive and give you the high.)   Legality:  Marijuana and its derivatives: illegal Hemp and its derivatives: Legal (State dependent) UPDATE: (07/22/2021) The Drug Enforcement Agency (DEA) issued a letter stating that delta cannabinoids, including Delta-8-THCO and Delta-9-THCO, synthetically derived from hemp do not qualify as hemp and will be viewed as Schedule I drugs. (Schedule I drugs, substances, or chemicals are defined as drugs with no currently accepted medical use and a high potential for abuse. Some examples of Schedule I drugs are: heroin, lysergic acid diethylamide (LSD), marijuana (cannabis), 3,4-methylenedioxymethamphetamine (ecstasy), methaqualone, and peyote.) (CueTune.com.ee)  Legal status of CBD in [place]:  Conditionally Legal  Reference: FDA Regulation of Cannabis and Cannabis-Derived Products, Including Cannabidiol (CBD) - OEMDeals.dk  Warning:  CBD is not FDA approved and has not undergo the same manufacturing controls as prescription drugs.  This means that the purity and safety of available CBD may be questionable. Most of the time, despite manufacturer's claims, it is contaminated with THC (delta-9-tetrahydrocannabinol - the chemical in marijuana responsible for the HIGH).  When this is the case, the [facility] contaminant will trigger a positive urine drug screen (UDS) test for Marijuana (carboxy-THC).   The FDA recently put out a warning about 5 things that everyone should be aware of regarding Delta-8  THC: Delta-8 THC products have not been evaluated or approved by the FDA for safe use and may be marketed in ways that put the public health at risk. The FDA has received adverse event reports involving delta-8 THC-containing products. Delta-8 THC has psychoactive and intoxicating effects. Delta-8 THC manufacturing often involve use of potentially harmful chemicals to create the concentrations of delta-8 THC claimed in the marketplace. The final delta-8 THC product may have potentially harmful by-products (contaminants) due to the chemicals used in the process. Manufacturing of delta-8 THC products may occur in uncontrolled or unsanitary settings, which may lead to the presence of unsafe contaminants or other potentially harmful substances. Delta-8 THC products should be kept out of the reach of children and pets.  NOTE: Because a positive UDS for any illicit substance is a violation of our medication agreement, your opioid analgesics (pain medicine) may be permanently discontinued.  MORE ABOUT CBD  General Information: CBD was discovered in 7 and it is a derivative of the cannabis sativa genus plants (Marijuana and Hemp). It is one of the 113 identified substances found in Marijuana. It accounts for up to 40% of the plant's extract. As of 2018, preliminary clinical studies on CBD included research for the treatment of anxiety, movement disorders, and pain. CBD is available and consumed in multiple forms, including inhalation of smoke or vapor, as an aerosol  spray, and by mouth. It may be supplied as an oil containing CBD, capsules, dried cannabis, or as a liquid solution. CBD is thought not to be as psychoactive as THC (delta-9-tetrahydrocannabinol - the chemical in marijuana responsible for the HIGH). Studies suggest that CBD may interact with different biological target receptors in the body, including cannabinoid and other neurotransmitter receptors. As of 2018 the mechanism of action for its  biological effects has not been determined.  Side-effects  Adverse reactions: Dry mouth, diarrhea, decreased appetite, fatigue, drowsiness, malaise, weakness, sleep disturbances, and others.  Drug interactions:  CBD may interact with medications such as blood-thinners. CBD causes drowsiness on its own and it will increase drowsiness caused by other medications, including antihistamines (such as Benadryl ), benzodiazepines (Xanax, Ativan, Valium ), antipsychotics, antidepressants, opioids, alcohol and supplements such as kava, melatonin and St. John's Wort.  Other drug interactions: Brivaracetam (Briviact); Caffeine ; Carbamazepine (Tegretol); Citalopram (Celexa); Clobazam (Onfi); Eslicarbazepine (Aptiom); Everolimus (Zostress); Lithium; Methadone (Dolophine); Rufinamide (Banzel); Sedative medications (CNS depressants); Sirolimus (Rapamune); Stiripentol (Diacomit); Tacrolimus (Prograf); Tamoxifen ; Soltamox); Topiramate  (Topamax ); Valproate; Warfarin (Coumadin); Zonisamide. (Last update: 05/15/2022) ______________________________________________________________________      ______________________________________________________________________     Naloxone  Nasal Spray  Why am I receiving this medication? [place]  STOP ACT requires that all patients taking high dose opioids or at risk of opioids respiratory depression, be prescribed an opioid reversal agent, such as Naloxone  (AKA: Narcan ).  What is this medication? NALOXONE  (nal OX one) treats opioid overdose, which causes slow or shallow breathing, severe drowsiness, or trouble staying awake. Call emergency services after using this medication. You may need additional treatment. Naloxone  works by reversing the effects of opioids. It belongs to a group of medications called opioid blockers.  COMMON BRAND NAME(S): Kloxxado , Narcan   What should I tell my care team before I take this medication? They need to know if you have any of these  conditions: Heart disease Substance use disorder An unusual or allergic reaction to naloxone , other medications, foods, dyes, or preservatives Pregnant or trying to get pregnant Breast-feeding  When to use this medication? This medication is to be used for the treatment of respiratory depression (less than 8 breaths per minute) secondary to opioid overdose.   How to use this medication? This medication is for use in the nose. Lay the person on their back. Support their neck with your hand and allow the head to tilt back before giving the medication. The nasal spray should be given into 1 nostril. After giving the medication, move the person onto their side. Do not remove or test the nasal spray until ready to use. Get emergency medical help right away after giving the first dose of this medication, even if the person wakes up. You should be familiar with how to recognize the signs and symptoms of a narcotic overdose. If more doses are needed, give the additional dose in the other nostril. Talk to your care team about the use of this medication in children. While this medication may be prescribed for children as young as newborns for selected conditions, precautions do apply.  Naloxone  Overdosage: If you think you have taken too much of this medicine contact a poison control center or emergency room at once.  NOTE: This medicine is only for you. Do not share this medicine with others.  What if I miss a dose? This does not apply.  What may interact with this medication? This is only used during an emergency. No interactions are expected during emergency use. This list  may not describe all possible interactions. Give your health care provider a list of all the medicines, herbs, non-prescription drugs, or dietary supplements you use. Also tell them if you smoke, drink alcohol, or use illegal drugs. Some items may interact with your medicine.  What should I watch for while using this  medication? Keep this medication ready for use in the case of an opioid overdose. Make sure that you have the phone number of your care team and local hospital ready. You may need to have additional doses of this medication. Each nasal spray contains a single dose. Some emergencies may require additional doses. After use, bring the treated person to the nearest hospital or call 911. Make sure the treating care team knows that the person has received a dose of this medication. You will receive additional instructions on what to do during and after use of this medication before an emergency occurs.  What side effects may I notice from receiving this medication? Side effects that you should report to your care team as soon as possible: Allergic reactions--skin rash, itching, hives, swelling of the face, lips, tongue, or throat Side effects that usually do not require medical attention (report these to your care team if they continue or are bothersome): Constipation Dryness or irritation inside the nose Headache Increase in blood pressure Muscle spasms Stuffy nose Toothache This list may not describe all possible side effects. Call your doctor for medical advice about side effects. You may report side effects to FDA at 1-800-FDA-1088.  Where should I keep my medication? Because this is an emergency medication, you should keep it with you at all times.  Keep out of the reach of children and pets. Store between 20 and 25 degrees C (68 and 77 degrees F). Do not freeze. Throw away any unused medication after the expiration date. Keep in original box until ready to use.  NOTE: This sheet is a summary. It may not cover all possible information. If you have questions about this medicine, talk to your doctor, pharmacist, or health care provider.   2023 Elsevier/Gold Standard (2021-01-28 00:00:00)  ______________________________________________________________________

## 2023-12-20 ENCOUNTER — Other Ambulatory Visit: Payer: Self-pay

## 2023-12-23 ENCOUNTER — Other Ambulatory Visit: Payer: Self-pay | Admitting: Cardiology

## 2023-12-23 ENCOUNTER — Other Ambulatory Visit: Payer: Self-pay | Admitting: Family Medicine

## 2023-12-23 DIAGNOSIS — K581 Irritable bowel syndrome with constipation: Secondary | ICD-10-CM

## 2023-12-24 ENCOUNTER — Other Ambulatory Visit (HOSPITAL_COMMUNITY): Payer: Self-pay

## 2023-12-24 DIAGNOSIS — M25511 Pain in right shoulder: Secondary | ICD-10-CM | POA: Diagnosis not present

## 2023-12-24 DIAGNOSIS — G4489 Other headache syndrome: Secondary | ICD-10-CM | POA: Diagnosis not present

## 2023-12-24 DIAGNOSIS — R293 Abnormal posture: Secondary | ICD-10-CM | POA: Diagnosis not present

## 2023-12-24 DIAGNOSIS — M542 Cervicalgia: Secondary | ICD-10-CM | POA: Diagnosis not present

## 2023-12-25 ENCOUNTER — Other Ambulatory Visit: Payer: Self-pay | Admitting: Cardiology

## 2023-12-25 ENCOUNTER — Other Ambulatory Visit: Payer: Self-pay

## 2023-12-25 ENCOUNTER — Ambulatory Visit (INDEPENDENT_AMBULATORY_CARE_PROVIDER_SITE_OTHER): Admitting: Family Medicine

## 2023-12-25 ENCOUNTER — Encounter: Payer: Self-pay | Admitting: Family Medicine

## 2023-12-25 ENCOUNTER — Other Ambulatory Visit (HOSPITAL_COMMUNITY): Payer: Self-pay

## 2023-12-25 VITALS — BP 117/81 | HR 81 | Temp 98.4°F | Ht 62.0 in | Wt 188.8 lb

## 2023-12-25 DIAGNOSIS — M542 Cervicalgia: Secondary | ICD-10-CM

## 2023-12-25 DIAGNOSIS — K581 Irritable bowel syndrome with constipation: Secondary | ICD-10-CM | POA: Diagnosis not present

## 2023-12-25 DIAGNOSIS — G4486 Cervicogenic headache: Secondary | ICD-10-CM | POA: Diagnosis not present

## 2023-12-25 DIAGNOSIS — I10 Essential (primary) hypertension: Secondary | ICD-10-CM

## 2023-12-25 DIAGNOSIS — R600 Localized edema: Secondary | ICD-10-CM | POA: Diagnosis not present

## 2023-12-25 MED ORDER — AMLODIPINE BESYLATE 10 MG PO TABS
10.0000 mg | ORAL_TABLET | Freq: Every day | ORAL | 1 refills | Status: DC
  Filled 2023-12-25: qty 90, 90d supply, fill #0
  Filled 2024-03-27: qty 90, 90d supply, fill #1

## 2023-12-25 MED ORDER — TRIAMTERENE-HCTZ 37.5-25 MG PO TABS
1.0000 | ORAL_TABLET | Freq: Every day | ORAL | 1 refills | Status: AC
  Filled 2023-12-25 – 2024-02-21 (×2): qty 90, 90d supply, fill #0
  Filled 2024-05-15: qty 90, 90d supply, fill #1

## 2023-12-25 MED ORDER — LINZESS 290 MCG PO CAPS
290.0000 ug | ORAL_CAPSULE | Freq: Every day | ORAL | 1 refills | Status: DC
  Filled 2023-12-25: qty 90, 90d supply, fill #0
  Filled 2024-03-27: qty 90, 90d supply, fill #1

## 2023-12-25 NOTE — Progress Notes (Signed)
 Established patient visit   Patient: Elizabeth Bowers   DOB: Oct 06, 1965   58 y.o. Female  MRN: [id number] Visit Date: 12/25/2023  Today's healthcare provider: Jon Eva, MD   Chief Complaint  Patient presents with   Follow-up    Patient was seen in ED for leg swelling and SOB- patient reports not feeling better. Legs still swelling and SOB coming up the stairs, HF clinic states there is fluid on her lungs- has f/u with them. Patient says HF clinic is managing lasix     Subjective    HPI HPI     Follow-up    Additional comments: Patient was seen in ED for leg swelling and SOB- patient reports not feeling better. Legs still swelling and SOB coming up the stairs, HF clinic states there is fluid on her lungs- has f/u with them. Patient says HF clinic is managing lasix        Last edited by Cherry Chiquita HERO, CMA on 12/25/2023  9:43 AM.       Discussed the use of AI scribe software for clinical note transcription with the patient, who gave verbal consent to proceed.  History of Present Illness   Elizabeth Bowers is a 58 year old female with migraines and heart failure who presents for a medication check and follow-up on an emergency visit.  She experiences severe migraines lasting up to four days, requiring bed rest and ice packs. Oxycodone  is prescribed for severe episodes, but she uses it sparingly, preferring to sleep through the pain. These migraines contribute to weight gain and depression due to decreased physical activity. During episodes, she has communication difficulties.  She reports leg swelling and shortness of breath. A three-day course of Lasix  reduced swelling but was insufficient for long-term management. Swelling persists, and elevating her feet only redistributes fluid.  Her current medications include Lasix , Maxzide , amlodipine , and Linzess . She awaits approval for additional Lasix  and potassium. Narcan  is prescribed as a precaution due to oxycodone  use. She  faces financial challenges with [employer].         Medications: Outpatient Medications Prior to Visit  Medication Sig   aspirin-acetaminophen -caffeine  (EXCEDRIN  MIGRAINE) 250-250-65 MG tablet Take 1 tablet by mouth every 6 (six) hours as needed for headache.   butalbital -acetaminophen -caffeine  (FIORICET ) 50-325-40 MG tablet Take 1-2 tablets by mouth every 6 (six) hours as needed for headache.   furosemide  (LASIX ) 20 MG tablet Take 2 tablets (40 mg total) by mouth daily for 1 day, THEN 1 tablet (20 mg total) daily for 2 days.   hydrOXYzine  (ATARAX ) 10 MG tablet Take 1 tablet (10 mg total) by mouth 3 (three) times daily as needed.   ketorolac  (TORADOL ) 10 MG tablet Take 1 tablet (10 mg total) by mouth every 6 (six) hours as needed.   lansoprazole  (PREVACID ) 30 MG capsule Take 1 capsule (30 mg total) by mouth 2 (two) times daily before a meal.   Multiple Vitamin (MULTIVITAMIN WITH MINERALS) TABS tablet Take 1 tablet by mouth daily.   naloxone  (NARCAN ) nasal spray 4 mg/0.1 mL Place 1 spray into the nose as needed for up to 365 doses (for opioid-induced respiratory depresssion). In case of emergency (overdose), spray once into each nostril. If no response within 3 minutes, repeat application and call 911.   ondansetron  (ZOFRAN -ODT) 4 MG disintegrating tablet Take 1 tablet (4 mg total) by mouth every 8 (eight) hours as needed for nausea or vomiting.   oxyCODONE  (ROXICODONE ) 5 MG immediate release tablet  Take 1 tablet (5 mg total) by mouth every 8 (eight) hours as needed. Must last 30 days   potassium chloride  SA (KLOR-CON  M) 20 MEQ tablet Take 1 tablet (20 MEQ total) with each dose of Lasix    promethazine  (PHENERGAN ) 25 MG tablet Take 1 tablet (25 mg total) by mouth every 6 (six) hours as needed for nausea or vomiting.   promethazine -dextromethorphan (PROMETHAZINE -DM) 6.25-15 MG/5ML syrup Take 5 mLs by mouth 4 (four) times daily as needed.   rizatriptan  (MAXALT -MLT) 5 MG disintegrating tablet  Dissolve 1 tablet (5 mg total) in the mouth as directed for Migraine. May take a second dose after 2 hours if needed.   [DISCONTINUED] amLODipine  (NORVASC ) 10 MG tablet Take 1 tablet (10 mg) by mouth daily.   [DISCONTINUED] LINZESS  290 MCG CAPS capsule Take 1 capsule (290 mcg total) by mouth daily.   [DISCONTINUED] triamterene -hydrochlorothiazide  (MAXZIDE -25) 37.5-25 MG tablet Take 1 tablet by mouth daily.   ipratropium (ATROVENT ) 0.06 % nasal spray Place 2 sprays into both nostrils 4 (four) times daily. (Patient not taking: Reported on 12/25/2023)   No facility-administered medications prior to visit.    Review of Systems     Objective    BP 117/81 (BP Location: Left Arm, Patient Position: Sitting)   Pulse 81   Temp 98.4 F (36.9 C) (Oral)   Ht 5' 2 (1.575 m)   Wt 188 lb 12.8 oz (85.6 kg)   SpO2 98%   BMI 34.53 kg/m    Physical Exam Vitals reviewed.  Constitutional:      General: She is not in acute distress.    Appearance: Normal appearance. She is well-developed. She is not diaphoretic.  HENT:     Head: Normocephalic and atraumatic.  Eyes:     General: No scleral icterus.    Conjunctiva/sclera: Conjunctivae normal.  Neck:     Thyroid: No thyromegaly.  Cardiovascular:     Rate and Rhythm: Normal rate and regular rhythm.     Heart sounds: Normal heart sounds. No murmur heard. Pulmonary:     Effort: Pulmonary effort is normal. No respiratory distress.     Breath sounds: Normal breath sounds. No wheezing, rhonchi or rales.  Musculoskeletal:     Cervical back: Neck supple.     Right lower leg: Edema present.     Left lower leg: Edema present.  Lymphadenopathy:     Cervical: No cervical adenopathy.  Skin:    General: Skin is warm and dry.     Findings: No rash.  Neurological:     Mental Status: She is alert and oriented to person, place, and time. Mental status is at baseline.  Psychiatric:        Mood and Affect: Mood normal.        Behavior: Behavior normal.       No results found for any visits on 12/25/23.  Assessment & Plan     Problem List Items Addressed This Visit       Cardiovascular and Mediastinum   Essential (primary) hypertension (Chronic)   Relevant Medications   amLODipine  (NORVASC ) 10 MG tablet   triamterene -hydrochlorothiazide  (MAXZIDE -25) 37.5-25 MG tablet     Digestive   IBS (irritable bowel syndrome)   Relevant Medications   LINZESS  290 MCG CAPS capsule     Other   Cervicogenic headache - Primary (Chronic)   Relevant Medications   amLODipine  (NORVASC ) 10 MG tablet   Cervicalgia (Bilateral) (R>L) (Chronic)   Other Visit Diagnoses  Lower extremity edema               Chronic migraine with severe episodes Chronic migraines with severe episodes, not responding to current medications. Experiences prolonged episodes lasting up to four days, leading to significant functional impairment and job loss. Neurology referral made, but delays in care experienced. Current management includes oxycodone  for severe episodes, with Narcan  provided as a precaution. Expresses frustration with lack of effective treatment and impact on quality of life. - Continue oxycodone  for severe episodes. - Ensure Narcan  is available as a precaution. - Consider alternative therapies such as a stimulator wrap for migraine relief.  Depression Depression secondary to chronic migraines and associated functional limitations. Reports significant impact on mental health due to inability to perform usual activities.  Heart failure with volume overload Heart failure with volume overload, presenting with leg swelling and shortness of breath. Recent ER visit noted fluid in lungs, and Lasix  was prescribed. Reports some improvement with Lasix  but requires ongoing management. Cardiology is involved, and an echocardiogram is pending. Awaiting confirmation for continued Lasix  prescription. - Continue Lasix  as prescribed and monitor for effectiveness. -  Follow up with cardiology for echocardiogram results and further management. - Attend appointment with heart failure clinic nurse practitioner for ongoing care.  Hypertension Hypertension well-controlled with current medication regimen. Blood pressure readings are stable. - Refill amlodipine  and Maxzide  prescriptions.  Cervical disc disorder with radiculopathy Cervical disc disorder with radiculopathy, confirmed by MRI showing bulging discs. Experiences significant pain and functional limitations. Physical therapy has been initiated, and traction is being considered as a therapeutic option. Insurance may cover home traction device if effective. - Continue physical therapy with focus on traction techniques. - Evaluate effectiveness of traction and consider insurance coverage for home traction device.  Left shoulder rotator cuff tear Left shoulder rotator cuff tear confirmed by MRI. Undergoing physical therapy to address the injury.  Chronic constipation Chronic constipation managed with Linzess . - Refill Linzess  prescription and send to [facility] for mail delivery.  Obstructive sleep apnea (suspected) Suspected obstructive sleep apnea contributing to overall health issues. Sleep study has been ordered to confirm diagnosis. - Complete home sleep apnea test as ordered.       Return in about 3 months (around 03/26/2024) for CPE.       Jon Eva, MD  [facility] [phone] (phone) [phone] (fax)  [facility]

## 2023-12-26 ENCOUNTER — Telehealth: Payer: Self-pay | Admitting: Family

## 2023-12-26 NOTE — Telephone Encounter (Signed)

## 2023-12-26 NOTE — Progress Notes (Addendum)
 Advanced Heart Failure Clinic Note    PCP: Myrla Jon HERO, MD Cardiologist: None  HF provider: Gardenia Led, MD  Chief Complaint: shortness of breath   HPI:  Elizabeth Bowers is a 59 y/o female with a history of HTN, headaches (migraine) and shortness of breath.   Was in the ED 12/14/23 with leg swelling, weight gain as well as intermittent dyspnea on exertion for last several weeks. BNP normal. First troponin negative. D-dimer negative. Second troponin negative.   Was seen in the [facility] 12/19/23 where diuretics were adjusted.   She presents today for a HF follow-up visit with a chief complaint of shortness of breath. Has associated fatigue, occasional chest pain, dizziness, pedal edema and chronic headaches. Pedal edema gets worse as the day progresses. Says that she was given 3 lasix  tablets at last visit and that they worked well but when she stopped taking them, the edema has returned. She estimates that she's gained 20 pounds in the last month.   She says that today she is having a severe headache. Has a history of migraines but she says that this headache is due to her degenerative disc disease.   ROS: All systems negative except what is listed in HPI, PMH and Problem List   Past Medical History:  Diagnosis Date   Fatty liver    Fibromyalgia    GERD (gastroesophageal reflux disease)    Hemorrhoids    Hot flashes    Hypertension    IBS (irritable bowel syndrome)    LUQ abdominal pain 10/25/2016   Migraines    Miscarriage    x 2; 4 live births    Multilevel degenerative disc disease    Neuroma    Vocal cord dysfunction     Current Outpatient Medications  Medication Sig Dispense Refill   amLODipine  (NORVASC ) 10 MG tablet Take 1 tablet (10 mg) by mouth daily. 90 tablet 1   aspirin-acetaminophen -caffeine  (EXCEDRIN  MIGRAINE) 250-250-65 MG tablet Take 1 tablet by mouth every 6 (six) hours as needed for headache. 30 tablet 0   butalbital -acetaminophen -caffeine  (FIORICET )  50-325-40 MG tablet Take 1-2 tablets by mouth every 6 (six) hours as needed for headache. 20 tablet 0   furosemide  (LASIX ) 20 MG tablet Take 2 tablets (40 mg total) by mouth daily for 1 day, THEN 1 tablet (20 mg total) daily for 2 days. 4 tablet 0   hydrOXYzine  (ATARAX ) 10 MG tablet Take 1 tablet (10 mg total) by mouth 3 (three) times daily as needed. 60 tablet 1   ipratropium (ATROVENT ) 0.06 % nasal spray Place 2 sprays into both nostrils 4 (four) times daily. (Patient not taking: Reported on 12/25/2023) 15 mL 12   ketorolac  (TORADOL ) 10 MG tablet Take 1 tablet (10 mg total) by mouth every 6 (six) hours as needed. 20 tablet 0   lansoprazole  (PREVACID ) 30 MG capsule Take 1 capsule (30 mg total) by mouth 2 (two) times daily before a meal. 180 capsule 1   LINZESS  290 MCG CAPS capsule Take 1 capsule (290 mcg total) by mouth daily. 90 capsule 1   Multiple Vitamin (MULTIVITAMIN WITH MINERALS) TABS tablet Take 1 tablet by mouth daily.     naloxone  (NARCAN ) nasal spray 4 mg/0.1 mL Place 1 spray into the nose as needed for up to 365 doses (for opioid-induced respiratory depresssion). In case of emergency (overdose), spray once into each nostril. If no response within 3 minutes, repeat application and call 911. 1 each 1   ondansetron  (ZOFRAN -ODT) 4 MG disintegrating  tablet Take 1 tablet (4 mg total) by mouth every 8 (eight) hours as needed for nausea or vomiting. 20 tablet 0   oxyCODONE  (ROXICODONE ) 5 MG immediate release tablet Take 1 tablet (5 mg total) by mouth every 8 (eight) hours as needed. Must last 30 days 42 tablet 0   potassium chloride  SA (KLOR-CON  M) 20 MEQ tablet Take 1 tablet (20 MEQ total) with each dose of Lasix  3 tablet 0   promethazine  (PHENERGAN ) 25 MG tablet Take 1 tablet (25 mg total) by mouth every 6 (six) hours as needed for nausea or vomiting. 20 tablet 0   promethazine -dextromethorphan (PROMETHAZINE -DM) 6.25-15 MG/5ML syrup Take 5 mLs by mouth 4 (four) times daily as needed. 118 mL 0    rizatriptan  (MAXALT -MLT) 5 MG disintegrating tablet Dissolve 1 tablet (5 mg total) in the mouth as directed for Migraine. May take a second dose after 2 hours if needed. 6 tablet 1   triamterene -hydrochlorothiazide  (MAXZIDE -25) 37.5-25 MG tablet Take 1 tablet by mouth daily. 90 tablet 1   No current facility-administered medications for this visit.    Allergies  Allergen Reactions   Gluten Meal     Other reaction(s): Abdominal Pain, Myalgias (Muscle Pain)    Lactose     Other reaction(s): Abdominal Pain, Myalgias (Muscle Pain)    Ranitidine Diarrhea and Other (See Comments)    Diarrhea and stomach cramping   Sulfa Antibiotics Anxiety   Amphetamine-Dextroamphetamine Other (See Comments)    Elevated BP   Lactose Intolerance (Gi)     bloating   Reglan [Metoclopramide] Other (See Comments)    Increased prolactin level    Topiramate  Er Other (See Comments)    Severe headache, nausea, vomiting, dizziness, visual disturbance     Wheat     bloating   Soap Rash    PROVON excess use      Social History   Socioeconomic History   Marital status: Married    Spouse name: [name]   Number of children: 6   Years of education: 14   Highest education level: Associate degree: occupational, Scientist, product/process development, or vocational program  Occupational History   Occupation: Science writer: [employer]  Tobacco Use   Smoking status: Former    Current packs/day: 0.00    Average packs/day: 0.8 packs/day for 5.0 years (3.8 ttl pk-yrs)    Types: Cigarettes    Start date: 07/05/1982    Quit date: 07/06/1987    Years since quitting: 36.4    Passive exposure: Never   Smokeless tobacco: Never  Vaping Use   Vaping status: Never Used  Substance and Sexual Activity   Alcohol use: Yes    Alcohol/week: 2.0 - 4.0 standard drinks of alcohol    Types: 2 - 4 Cans of beer per week    Comment: wine cooler   Drug use: Never   Sexual activity: Not on file  Other Topics Concern   Not on file  Social  History Narrative   Not on file   Social Drivers of Health   Financial Resource Strain: Low Risk  (12/13/2023)   Received from [facility]   Overall Financial Resource Strain (CARDIA)    Difficulty of Paying Living Expenses: Not very hard  Food Insecurity: No Food Insecurity (12/13/2023)   Received from [facility]   Hunger Vital Sign    Within the past 12 months, you worried that your food would run out before you got the money to buy more.:  Never true    Within the past 12 months, the food you bought just didn't last and you didn't have money to get more.: Never true  Transportation Needs: No Transportation Needs (12/13/2023)   Received from [facility] - Transportation    In the past 12 months, has lack of transportation kept you from medical appointments or from getting medications?: No    Lack of Transportation (Non-Medical): No  Physical Activity: Unknown (09/27/2023)   Exercise Vital Sign    Days of Exercise per Week: Patient declined    Minutes of Exercise per Session: Not on file  Stress: Stress Concern Present (09/27/2023)   [employer] [facility] [employer] [facility] - Occupational Stress Questionnaire    Feeling of Stress : Very much  Social Connections: Unknown (09/27/2023)   Social Connection and Isolation Panel    Frequency of Communication with Friends and Family: Patient declined    Frequency of Social Gatherings with Friends and Family: Patient declined    Attends Religious Services: Patient declined    Database administrator or Organizations: No    Attends Engineer, structural: Not on file    Marital Status: Married  Catering manager Violence: Not on file      Family History  Problem Relation Age of Onset   Hypertension Mother    Hyperlipidemia Mother    Glaucoma Mother    Diabetes Mother        type 2    COPD Father        emphysema   Alcohol abuse Father    Colon cancer Sister     Cancer Sister        rectal/colon cancer no h/o IBD   Colon polyps Sister    Asthma Sister    COPD Sister    Kidney disease Sister    Mental illness Sister        bipolar    Miscarriages / [place] Sister    Uterine cancer Sister    Alcohol abuse Son    Depression Son    Diabetes Son    Drug abuse Son    Mental illness Son        bipolar    Diabetes Maternal Grandmother    COPD Paternal Grandmother    Cancer Paternal Grandfather        FH colon cancer maternal aunts/uncles    Miscarriages / Stillbirths Sister    Arthritis Son    Arthritis Son    Breast cancer Neg Hx    Vitals:   12/27/23 1127  BP: 108/75  Pulse: 78  SpO2: 96%  Weight: 189 lb 6.4 oz (85.9 kg)   Wt Readings from Last 3 Encounters:  12/27/23 189 lb (85.7 kg)  12/27/23 189 lb 6.4 oz (85.9 kg)  12/25/23 188 lb 12.8 oz (85.6 kg)   Lab Results  Component Value Date   CREATININE 1.14 (H) 12/27/2023   CREATININE 1.06 (H) 12/19/2023   CREATININE 0.95 12/14/2023    PHYSICAL EXAM:  General: Well appearing.  Cor: No JVD. Regular rhythm, rate.  Lungs: clear Abdomen: soft, nontender, nondistended. Extremities: no edema Neuro:. Affect pleasant   ECG: not done   ASSESSMENT & PLAN:  1: Shortness of breath- - unclear etiology or if symptoms are due to HF or not - echo scheduled for 01/11/24 - NYHA class III - moderately fluid overloaded with symptoms/ edema - weighing daily - weight unchanged from last visit 1 week ago - resume  furosemide  20mg  daily although instructed to take 40mg  today. Will evaluate for potassium after BMET results obtained - BNP 12/19/23 was 10.8  2: HTN- - BP 108/75 - saw PCP Lollie) 07/25  - BMET 12/19/23 reviewed: sodium 141, potassium 3.5, creatinine 1.06 and GFR >60 - BMET today   Return in 2 weeks, sooner if needed.   Ellouise DELENA Class, FNP 12/26/23

## 2023-12-27 ENCOUNTER — Encounter: Admitting: Family

## 2023-12-27 ENCOUNTER — Other Ambulatory Visit
Admission: RE | Admit: 2023-12-27 | Discharge: 2023-12-27 | Disposition: A | Discharge status: Home / Self Care | Source: Ambulatory Visit | Attending: Family | Admitting: Family

## 2023-12-27 ENCOUNTER — Encounter: Payer: Self-pay | Admitting: Family

## 2023-12-27 ENCOUNTER — Other Ambulatory Visit: Payer: Self-pay

## 2023-12-27 ENCOUNTER — Ambulatory Visit: Payer: Self-pay | Admitting: Family

## 2023-12-27 ENCOUNTER — Encounter: Payer: Self-pay | Admitting: Neurosurgery

## 2023-12-27 ENCOUNTER — Ambulatory Visit (INDEPENDENT_AMBULATORY_CARE_PROVIDER_SITE_OTHER): Admitting: Neurosurgery

## 2023-12-27 ENCOUNTER — Ambulatory Visit (HOSPITAL_BASED_OUTPATIENT_CLINIC_OR_DEPARTMENT_OTHER): Admitting: Family

## 2023-12-27 VITALS — BP 128/70 | Ht 62.0 in | Wt 189.0 lb

## 2023-12-27 VITALS — BP 108/75 | HR 78 | Wt 189.4 lb

## 2023-12-27 DIAGNOSIS — I10 Essential (primary) hypertension: Secondary | ICD-10-CM | POA: Diagnosis not present

## 2023-12-27 DIAGNOSIS — M5412 Radiculopathy, cervical region: Secondary | ICD-10-CM | POA: Diagnosis not present

## 2023-12-27 DIAGNOSIS — R0602 Shortness of breath: Secondary | ICD-10-CM | POA: Diagnosis not present

## 2023-12-27 DIAGNOSIS — R600 Localized edema: Secondary | ICD-10-CM | POA: Diagnosis not present

## 2023-12-27 DIAGNOSIS — G43909 Migraine, unspecified, not intractable, without status migrainosus: Secondary | ICD-10-CM | POA: Diagnosis not present

## 2023-12-27 LAB — BASIC METABOLIC PANEL WITH GFR
Anion gap: 10 (ref 5–15)
BUN: 17 mg/dL (ref 6–20)
CO2: 28 mmol/L (ref 22–32)
Calcium: 9.4 mg/dL (ref 8.9–10.3)
Chloride: 103 mmol/L (ref 98–111)
Creatinine, Ser: 1.14 mg/dL — ABNORMAL HIGH (ref 0.44–1.00)
GFR, Estimated: 55 mL/min — ABNORMAL LOW (ref >60)
Glucose, Bld: 108 mg/dL — ABNORMAL HIGH (ref 70–99)
Potassium: 3.7 mmol/L (ref 3.5–5.1)
Sodium: 141 mmol/L (ref 135–145)

## 2023-12-27 MED ORDER — FUROSEMIDE 20 MG PO TABS
20.0000 mg | ORAL_TABLET | Freq: Every day | ORAL | 1 refills | Status: DC
  Filled 2023-12-27: qty 90, 90d supply, fill #0

## 2023-12-27 MED ORDER — POTASSIUM CHLORIDE CRYS ER 20 MEQ PO TBCR: 20.0000 meq | EXTENDED_RELEASE_TABLET | Freq: Every day | ORAL | Status: DC

## 2023-12-27 NOTE — Progress Notes (Signed)
 Referring Physician:  Myrla Jon HERO, MD [address] [place],  [place] [address]  Primary Physician:  Myrla Jon HERO, MD  History of Present Illness: 12/27/2023 She has tried physical therapy since I last saw her.  She has had continued right arm pain.  11/01/2023 Elizabeth Bowers is here today with a chief complaint of chronic neck and arm pain who presents for evaluation of worsening symptoms. She was referred by Dr. Naveira for evaluation of neck problems potentially contributing to headaches.  Neck pain radiates down the right arm, affecting the entire hand, including all five fingers, for approximately two months since March 2025. The pain is accompanied by weakness in the arm, leading to dropping objects, and pain in the right shoulder blade. Ice packs are used for pain management, which worsens with movement.  A history of falls includes one in December 2024 while doing laundry, injuring her shoulder blade and neck, and another in January 2025 where she slipped on a dog's bed, injuring her left arm. Symptoms worsened in March 2025, coinciding with the onset of severe migraines.  Diagnostic studies include a CT scan of the shoulder and an MRI of the cervical spine. Previous treatments, including injections and pain medications like Toradol  and migraine cocktails, have been ineffective. Severe pain sometimes leads to vomiting and requires her to remain still, unable to lie on her right side due to pain.  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 She wants to return to work.  Neck pain that radiates to the right shoulder and headaches.  She has difficulty with range of motion of her right arm.  Bowel/Bladder Dysfunction: none  Conservative measures:  Physical therapy: has not participated in PT  Multimodal medical therapy including regular  antiinflammatories: Oxycodone , Toradol   Injections: 10/30/2023 Right C7-T1 ESI 09/26/2023 Bilateral C3, C4 MBB 01/11/2023 Right C7-T1 ESI 05/11/2022 Right C7-T1 ESI 09/08/2021 Right L3-4 ESI   Past Surgery: no   Elizabeth Bowers has no symptoms of cervical myelopathy.  The symptoms are causing a significant impact on the patient's life.   I have utilized the care everywhere function in epic to review the outside records available from external health systems.  Review of Systems:  A 10 point review of systems is negative, except for the pertinent positives and negatives detailed in the HPI.  Past Medical History: Past Medical History:  Diagnosis Date   Fatty liver    Fibromyalgia    GERD (gastroesophageal reflux disease)    Hemorrhoids    Hot flashes    Hypertension    IBS (irritable bowel syndrome)    LUQ abdominal pain 10/25/2016   Migraines    Miscarriage    x 2; 4 live births    Multilevel degenerative disc disease    Neuroma    Vocal cord dysfunction     Past Surgical History: Past Surgical History:  Procedure Laterality Date   ABDOMINAL HYSTERECTOMY     2012 removed cervix h/o abnormal pap    CHOLECYSTECTOMY     2000   COLONOSCOPY     2018 with + polpys and GIB 2/2 polyp removal    COLONOSCOPY WITH PROPOFOL  N/A 12/22/2019   Procedure: COLONOSCOPY WITH PROPOFOL ;  Surgeon: Unk Corinn Skiff, MD;  Location: [facility] ENDOSCOPY;  Service: Gastroenterology;  Laterality: N/A;   COLONOSCOPY WITH PROPOFOL  N/A 12/23/2019   Procedure: COLONOSCOPY  WITH PROPOFOL ;  Surgeon: Unk Corinn Skiff, MD;  Location: [facility] ENDOSCOPY;  Service: Gastroenterology;  Laterality: N/A;  Last name pronounced LEE-MA   COLONOSCOPY WITH PROPOFOL  N/A 01/01/2023   Procedure: COLONOSCOPY WITH PROPOFOL ;  Surgeon: Unk Corinn Skiff, MD;  Location[facility] END[facility]COPY;  Service: Gastroenterology;  Laterality: N/A;   ELBOW DEBRIDEMENT     ESOPHAGOGASTRODUODENOSCOPY (EGD) WITH PROPOFOL  N/A 02/06/2018   Procedure:  ESOPHAGOGASTRODUODENOSCOPY (EGD) WITH PROPOFOL  with biopsies;  Surgeon: Unk Corinn Skiff, MD;  Location: [facility];  Service: Endoscopy;  Laterality: N/A;   ETHMOIDECTOMY N/A 09/28/2021   Procedure: TOTAL ETHMOIDECTOMY;  Surgeon: Milissa Hamming, MD;  Location: [facility];  Service: ENT;  Laterality: N/A;   FOOT SURGERY     IMAGE GUIDED SINUS SURGERY N/A 09/28/2021   Procedure: IMAGE GUIDED SINUS SURGERY;  Surgeon: Milissa Hamming, MD;  Location: [facility];  Service: ENT;  Laterality: N/A;  PLACED DISK ON OR CHARGE NURSE DESK 4-21  KP   MAXILLARY ANTROSTOMY Bilateral 09/28/2021   Procedure: MAXILLARY ANTROSTOMY WITHOUT TISSUE REMOVAL;  Surgeon: Milissa Hamming, MD;  Location: [facility];  Service: ENT;  Laterality: Bilateral;   POLYPECTOMY  01/01/2023   Procedure: POLYPECTOMY;  Surgeon: Unk Corinn Skiff, MD;  Location: [facility] ENDOSCOPY;  Service: Gastroenterology;;   CLEONE Bilateral 09/28/2021   Procedure: SPHENOIDOTOMY;  Surgeon: Milissa Hamming, MD;  Location: [facility];  Service: ENT;  Laterality: Bilateral;    Allergies: Allergies as of 12/27/2023 - Review Complete 12/27/2023  Allergen Reaction Noted   Gluten meal  04/22/2019   Lactose  04/22/2019   Ranitidine Diarrhea and Other (See Comments) 08/30/2016   Sulfa antibiotics Anxiety 12/15/2016   Amphetamine-dextroamphetamine Other (See Comments) 05/10/2016   Lactose intolerance (gi)  02/05/2018   Reglan [metoclopramide] Other (See Comments) 12/15/2016   Topiramate  er Other (See Comments) 01/16/2018   Wheat  02/05/2018   Soap Rash 02/20/2017    Medications:  Current Outpatient Medications:    amLODipine  (NORVASC ) 10 MG tablet, Take 1 tablet (10 mg) by mouth daily., Disp: 90 tablet, Rfl: 1   aspirin-acetaminophen -caffeine  (EXCEDRIN  MIGRAINE) 250-250-65 MG tablet, Take 1 tablet by mouth every 6 (six) hours as needed for headache., Disp: 30 tablet, Rfl: 0    butalbital -acetaminophen -caffeine  (FIORICET ) 50-325-40 MG tablet, Take 1-2 tablets by mouth every 6 (six) hours as needed for headache., Disp: 20 tablet, Rfl: 0   furosemide  (LASIX ) 20 MG tablet, Take 1 tablet (20 mg total) by mouth daily., Disp: 90 tablet, Rfl: 1   hydrOXYzine  (ATARAX ) 10 MG tablet, Take 1 tablet (10 mg total) by mouth 3 (three) times daily as needed., Disp: 60 tablet, Rfl: 1   ipratropium (ATROVENT ) 0.06 % nasal spray, Place 2 sprays into both nostrils 4 (four) times daily., Disp: 15 mL, Rfl: 12   ketorolac  (TORADOL ) 10 MG tablet, Take 1 tablet (10 mg total) by mouth every 6 (six) hours as needed., Disp: 20 tablet, Rfl: 0   lansoprazole  (PREVACID ) 30 MG capsule, Take 1 capsule (30 mg total) by mouth 2 (two) times daily before a meal., Disp: 180 capsule, Rfl: 1   LINZESS  290 MCG CAPS capsule, Take 1 capsule (290 mcg total) by mouth daily., Disp: 90 capsule, Rfl: 1   Multiple Vitamin (MULTIVITAMIN WITH MINERALS) TABS tablet, Take 1 tablet by mouth daily., Disp: , Rfl:    naloxone  (NARCAN ) nasal spray 4 mg/0.1 mL, Place 1 spray into the nose as needed for up to 365 doses (for opioid-induced respiratory depresssion). In case of emergency (  overdose), spray once into each nostril. If no response within 3 minutes, repeat application and call 911., Disp: 1 each, Rfl: 1   ondansetron  (ZOFRAN -ODT) 4 MG disintegrating tablet, Take 1 tablet (4 mg total) by mouth every 8 (eight) hours as needed for nausea or vomiting., Disp: 20 tablet, Rfl: 0   oxyCODONE  (ROXICODONE ) 5 MG immediate release tablet, Take 1 tablet (5 mg total) by mouth every 8 (eight) hours as needed. Must last 30 days, Disp: 42 tablet, Rfl: 0   potassium chloride  SA (KLOR-CON  M) 20 MEQ tablet, Take 1 tablet (20 mEq total) by mouth daily., Disp: , Rfl:    promethazine  (PHENERGAN ) 25 MG tablet, Take 1 tablet (25 mg total) by mouth every 6 (six) hours as needed for nausea or vomiting., Disp: 20 tablet, Rfl: 0    promethazine -dextromethorphan (PROMETHAZINE -DM) 6.25-15 MG/5ML syrup, Take 5 mLs by mouth 4 (four) times daily as needed., Disp: 118 mL, Rfl: 0   rizatriptan  (MAXALT -MLT) 5 MG disintegrating tablet, Dissolve 1 tablet (5 mg total) in the mouth as directed for Migraine. May take a second dose after 2 hours if needed., Disp: 6 tablet, Rfl: 1   triamterene -hydrochlorothiazide  (MAXZIDE -25) 37.5-25 MG tablet, Take 1 tablet by mouth daily., Disp: 90 tablet, Rfl: 1  Social History: Social History   Tobacco Use   Smoking status: Former    Current packs/day: 0.00    Average packs/day: 0.8 packs/day for 5.0 years (3.8 ttl pk-yrs)    Types: Cigarettes    Start date: 07/05/1982    Quit date: 07/06/1987    Years since quitting: 36.5    Passive exposure: Never   Smokeless tobacco: Never  Vaping Use   Vaping status: Never Used  Substance Use Topics   Alcohol use: Yes    Alcohol/week: 2.0 - 4.0 standard drinks of alcohol    Types: 2 - 4 Cans of beer per week    Comment: wine cooler   Drug use: Never    Family Medical History: Family History  Problem Relation Age of Onset   Hypertension Mother    Hyperlipidemia Mother    Glaucoma Mother    Diabetes Mother        type 2    COPD Father        emphysema   Alcohol abuse Father    Colon cancer Sister    Cancer Sister        rectal/colon cancer no h/o IBD   Colon polyps Sister    Asthma Sister    COPD Sister    Kidney disease Sister    Mental illness Sister        bipolar    Miscarriages / [place] Sister    Uterine cancer Sister    Alcohol abuse Son    Depression Son    Diabetes Son    Drug abuse Son    Mental illness Son        bipolar    Diabetes Maternal Grandmother    COPD Paternal Grandmother    Cancer Paternal Grandfather        FH colon cancer maternal aunts/uncles    Miscarriages / Stillbirths Sister    Arthritis Son    Arthritis Son    Breast cancer Neg Hx     Physical Examination: Vitals:   12/27/23 1354  BP:  128/70    General: Patient is tearful. Attention to examination is appropriate.  Neck:   Supple.  Full range of motion.  Respiratory: Patient is breathing without  any difficulty.   NEUROLOGICAL:     Awake, alert, oriented to person, place, and time.  Speech is clear and fluent.   Cranial Nerves: Pupils equal round and reactive to light.  Facial tone is symmetric.  Facial sensation is symmetric. Shoulder shrug is symmetric. Tongue protrusion is midline.  There is no pronator drift.  Strength: Side Biceps Triceps Deltoid Interossei Grip Wrist Ext. Wrist Flex.  R 5 5 5 5 5 5 5   L 5 5 5 5 5 5 5    Side Iliopsoas Quads Hamstring PF DF EHL  R 5 5 5 5 5 5   L 5 5 5 5 5 5    Reflexes are 1+ and symmetric at the biceps, triceps, brachioradialis, patella and achilles.   Hoffman's is absent.   Bilateral upper and lower extremity sensation is intact to light touch except right C6 distribution..    No evidence of dysmetria noted.  Gait is normal.      Medical Decision Making  Imaging: MRI 10/17/2023 Disc levels:   C2-3: Facet spurring asymmetric to the right where there is ankylosis. No neural impingement   C3-4: Disc narrowing and bulging with uncovertebral ridging. Mild degenerative facet spurring. Moderate right foraminal narrowing which is progressed.   C4-5: Mild disc height loss with ventral endplate spurring. Degenerative facet spurring greater on the left. Patent canal and foramina   C5-6: Disc narrowing and bulging with endplate and uncovertebral ridging. There is a central disc protrusion. Bilateral foraminal impingement.   C6-7: Disc narrowing and bulging with endplate ridging. Mild right and moderate left foraminal narrowing.   C7-T1:Unremarkable.   IMPRESSION: Diffuse cervical spine degeneration with generalized progression since 2020.   Foraminal impingement bilaterally at C5-6 and on the left at C6-7.   Diffusely patent spinal canal.      Electronically Signed   By: Dorn Roulette M.D.   On: 10/24/2023 09:42    I have personally reviewed the images and agree with the above interpretation.  Assessment and Plan: Elizabeth Bowers is a pleasant 58 y.o. female with multiple issues.  Her migraine headache is causing her severe difficulty.    She has an ongoing right C6 radiculopathy.  She has tried physical therapy without improvement.  At this point, no further conservative management is indicated.  Have recommended surgical intervention with C5-6 anterior cervical discectomy and fusion.  I discussed the planned procedure at length with the patient, including the risks, benefits, alternatives, and indications. The risks discussed include but are not limited to bleeding, infection, need for reoperation, spinal fluid leak, stroke, vision loss, anesthetic complication, coma, paralysis, and even death. We also discussed the possibility of post-operative dysphagia, vocal cord paralysis, and the risk of adjacent segment disease in the future. I also described in detail that improvement was not guaranteed.  The patient expressed understanding of these risks, and asked that we proceed with surgery. I described the surgery in layman's terms, and gave ample opportunity for questions, which were answered to the best of my ability.   I spent a total of 20 minutes in this patient's care today. This time was spent reviewing pertinent records including imaging studies, obtaining and confirming history, performing a directed evaluation, formulating and discussing my recommendations, and documenting the visit within the medical record.    Thank you for involving me in the care of this patient.      Khasir Woodrome K. Clois MD, [facility]

## 2023-12-27 NOTE — Patient Instructions (Signed)
 Planned surgery: C5-6 anterior cervical discectomy and fusion  Contact our office when you are discharged from physical therapy  You will need clearance from cardiology for surgery

## 2023-12-27 NOTE — Addendum Note (Signed)
 Addended by: Alyus Mofield on: 12/27/2023 02:57 PM   Modules accepted: Orders

## 2023-12-27 NOTE — Patient Instructions (Addendum)
 Medication Changes:  START Furosemide  20mg  (1 tab) daily. TODAY ONLY take 40mg  (2 tabs)  Lab Work:  Go over to the [facility]. Go pass the gift shop and have your blood work completed.  We will only call you if the results are abnormal or if the provider would like to make medication changes.   If you are having labs drawn today, any labs that are abnormal the clinic will call you. No news is good news.   Testing/Procedures:  Your physician has requested that you have an echocardiogram. Echocardiography is a painless test that uses sound waves to create images of your heart. It provides your doctor with information about the size and shape of your heart and how well your heart's chambers and valves are working. This procedure takes approximately one hour. There are no restrictions for this procedure. Please do NOT wear cologne, perfume, aftershave, or lotions (deodorant is allowed). Please arrive 15 minutes prior to your appointment time.  Please note: We ask at that you not bring children with you during ultrasound (echo/ vascular) testing. Due to room size and safety concerns, children are not allowed in the ultrasound rooms during exams. Our front office staff cannot provide observation of children in our lobby area while testing is being conducted. An adult accompanying a patient to their appointment will only be allowed in the ultrasound room at the discretion of the ultrasound technician under special circumstances. We apologize for any inconvenience.   Follow-Up in: Please follow up with the [facility] in 2 weeks with Ellouise Class, FNP.   Thank you for choosing [facility].    At the [facility], you and your health needs are our priority. We have a designated team specialized in the treatment of Heart Failure. This Care Team includes your primary Heart Failure Specialized Cardiologist (physician),  Advanced Practice Providers (APPs- Physician Assistants and Nurse Practitioners), and Pharmacist who all work together to provide you with the care you need, when you need it.   You may see any of the following providers on your designated Care Team at your next follow up:  Dr. Toribio Fuel Dr. Ezra Shuck Dr. Ria Commander Dr. Morene Brownie Ellouise Class, FNP Jaun Bash, RPH-CPP  Please be sure to bring in all your medications bottles to every appointment.   Need to Contact Us :  If you have any questions or concerns before your next appointment please send us  a message through [place] or call our office at [phone].    TO LEAVE A MESSAGE FOR THE NURSE SELECT OPTION 2, PLEASE LEAVE A MESSAGE INCLUDING: YOUR NAME DATE OF BIRTH CALL BACK NUMBER REASON FOR CALL**this is important as we prioritize the call backs  YOU WILL RECEIVE A CALL BACK THE SAME DAY AS LONG AS YOU CALL BEFORE 4:00 PM

## 2023-12-28 ENCOUNTER — Other Ambulatory Visit: Payer: Self-pay

## 2023-12-28 MED ORDER — POTASSIUM CHLORIDE CRYS ER 20 MEQ PO TBCR
20.0000 meq | EXTENDED_RELEASE_TABLET | Freq: Every day | ORAL | 1 refills | Status: DC
  Filled 2023-12-28: qty 90, 90d supply, fill #0

## 2023-12-31 DIAGNOSIS — R293 Abnormal posture: Secondary | ICD-10-CM | POA: Diagnosis not present

## 2023-12-31 DIAGNOSIS — M542 Cervicalgia: Secondary | ICD-10-CM | POA: Diagnosis not present

## 2023-12-31 DIAGNOSIS — G4489 Other headache syndrome: Secondary | ICD-10-CM | POA: Diagnosis not present

## 2023-12-31 DIAGNOSIS — M25511 Pain in right shoulder: Secondary | ICD-10-CM | POA: Diagnosis not present

## 2024-01-08 ENCOUNTER — Encounter: Payer: Self-pay | Admitting: Nurse Practitioner

## 2024-01-08 ENCOUNTER — Ambulatory Visit: Payer: Self-pay | Attending: Nurse Practitioner | Admitting: Nurse Practitioner

## 2024-01-08 VITALS — BP 128/86 | HR 102 | Temp 97.2°F | Ht 62.0 in | Wt 185.0 lb

## 2024-01-08 DIAGNOSIS — Z79899 Other long term (current) drug therapy: Secondary | ICD-10-CM | POA: Diagnosis not present

## 2024-01-08 DIAGNOSIS — G4486 Cervicogenic headache: Secondary | ICD-10-CM

## 2024-01-08 DIAGNOSIS — M4802 Spinal stenosis, cervical region: Secondary | ICD-10-CM

## 2024-01-08 DIAGNOSIS — G8929 Other chronic pain: Secondary | ICD-10-CM | POA: Insufficient documentation

## 2024-01-08 DIAGNOSIS — G894 Chronic pain syndrome: Secondary | ICD-10-CM | POA: Diagnosis not present

## 2024-01-08 DIAGNOSIS — M542 Cervicalgia: Secondary | ICD-10-CM | POA: Diagnosis not present

## 2024-01-08 DIAGNOSIS — M5030 Other cervical disc degeneration, unspecified cervical region: Secondary | ICD-10-CM

## 2024-01-08 DIAGNOSIS — M79601 Pain in right arm: Secondary | ICD-10-CM

## 2024-01-08 DIAGNOSIS — M79602 Pain in left arm: Secondary | ICD-10-CM

## 2024-01-08 NOTE — Progress Notes (Signed)
 PROVIDER NOTE: Interpretation of information contained herein should be left to medically-trained personnel. Specific patient instructions are provided elsewhere under Patient Instructions section of medical record. This document was created in part using AI and STT-dictation technology, any transcriptional errors that may result from this process are unintentional.  Patient: Elizabeth Bowers  Service: E/M   PCP: Myrla Jon HERO, MD  DOB: January 21, 1966  DOS: 01/08/2024  Provider: Emmy MARLA Blanch, NP  MRN: [id number]  Delivery: Face-to-face  Specialty: Interventional Pain Management  Type: Established Patient  Setting: Ambulatory outpatient facility  Specialty designation: 09  Referring Prov.: Myrla Jon HERO, MD  Location: Outpatient office facility       History of present illness (HPI) Elizabeth Bowers, a 58 y.o. year old female, is here today because of her Cervicalgia [M54.2]. Elizabeth Bowers primary complain today is Headache  Pertinent problems: Elizabeth Bowers Chronic sacroiliac joint pain (Right); Fibromyalgia; Chronic knee pain (Right); DDD (degenerative disc disease), lumbar; Chronic low back pain (2ry area of Pain) (Bilateral) (L>R) w/ sciatica (Bilateral); Chronic lower extremity pain (3ry area of Pain) (Bilateral) (L>R); and Chronic pain syndrome on their pertinent problem list.  Pain Assessment: Severity of Chronic pain is reported as a 6 /10. Location: Head  /denies. Onset: More than a month ago. Quality: Contraction, Tightness. Timing: Constant. Modifying factor(s): nothing. Vitals:  height is 5' 2 (1.575 m) and weight is 185 lb (83.9 kg). Her temperature is 97.2 F (36.2 C) (abnormal). Her blood pressure is 128/86 and her pulse is 102 (abnormal). Her oxygen saturation is 99%.  BMI: Estimated body mass index is 33.84 kg/m as calculated from the following:   Height as of this encounter: 5' 2 (1.575 m).   Weight as of this encounter: 185 lb (83.9 kg).  Last encounter: 11/08/2023. Last  procedure: Visit date not found.  Reason for encounter: follow-up evaluation.   [name] continues with her current pain medication regimen and uses it strictly on as needed basis.  She still has 39 out of 42 tablets remaining and does not require any pain medication today. She indicates doing well with current medication regimen. No side effects or adverse reaction reported to medication.   Pharmacotherapy Assessment   Monitoring: [place] PMP: PDMP reviewed during this encounter.       Pharmacotherapy: No side-effects or adverse reactions reported. Compliance: No problems identified. Effectiveness: Clinically acceptable.  Margrette Nathanel PARAS, RN  01/08/2024 10:32 AM  Sign when Signing Visit Safety precautions to be maintained throughout the outpatient stay will include: orient to surroundings, keep bed in low position, maintain call bell within reach at all times, provide assistance with transfer out of bed and ambulation.   Nursing Pain Medication Assessment:  Safety precautions to be maintained throughout the outpatient stay will include: orient to surroundings, keep bed in low position, maintain call bell within reach at all times, provide assistance with transfer out of bed and ambulation.  Medication Inspection Compliance: Pill count conducted under aseptic conditions, in front of the patient. Neither the pills nor the bottle was removed from the patient's sight at any time. Once count was completed pills were immediately returned to the patient in their original bottle.  Medication: Oxycodone  IR Pill/Patch Count: 39 of 42 pills/patches remain Pill/Patch Appearance: Markings consistent with prescribed medication Bottle Appearance: Standard pharmacy container. Clearly labeled. Filled Date: 7 / 40 / 2025 Last Medication intake:  3-4 days ago    UDS:  Summary  Date Value Ref Range Status  11/08/2023 FINAL  Final    Comment:     ==================================================================== [employer] Select 13 (MW) ==================================================================== Test                             Result       Flag       Units  Drug Absent but Declared for Prescription Verification   Oxycodone                       Not Detected UNEXPECTED ng/mg creat   Butalbital                      Not Detected UNEXPECTED ==================================================================== Test                      Result    Flag   Units      Ref Range   Creatinine              174              mg/dL      >=79 ==================================================================== Declared Medications:  The flagging and interpretation on this report are based on the  following declared medications.  Unexpected results may arise from  inaccuracies in the declared medications.   **Note: The testing scope of this panel includes these medications:   Butalbital  (Fioricet )  Oxycodone  (Roxicodone )   **Note: The testing scope of this panel does not include the  following reported medications:   Acetaminophen  (Excedrin )  Acetaminophen  (Fioricet )  Amlodipine  (Norvasc )  Aspirin (Excedrin )  Caffeine  (Excedrin )  Caffeine  (Fioricet )  Dextromethorphan  Hydrochlorothiazide  (Maxzide )  Hydroxyzine  (Atarax )  Ipratropium (Atrovent )  Ketorolac  (Toradol )  Lansoprazole  (Prevacid )  Linaclotide  (Linzess )  Multivitamin  Ondansetron  (Zofran )  Promethazine  (Phenergan )  Rizatriptan  (Maxalt )  Triamterene  (Maxzide ) ==================================================================== For clinical consultation, please call [phone]. ====================================================================     No results found for: CBDTHCR No results found for: D8THCCBX No results found for: D9THCCBX  ROS  Constitutional: Denies any fever or chills Gastrointestinal: No reported hemesis, hematochezia,  vomiting, or acute GI distress Musculoskeletal: Denies any acute onset joint swelling, redness, loss of ROM, or weakness Neurological: No reported episodes of acute onset apraxia, aphasia, dysarthria, agnosia, amnesia, paralysis, loss of coordination, or loss of consciousness  Medication Review  amLODipine , aspirin-acetaminophen -caffeine , butalbital -acetaminophen -caffeine , furosemide , hydrOXYzine , ipratropium, ketorolac , lansoprazole , linaclotide , multivitamin with minerals, naloxone , ondansetron , oxyCODONE , potassium chloride  SA, promethazine , promethazine -dextromethorphan, rizatriptan , and triamterene -hydrochlorothiazide   History Review  Allergy : Ms. Hohler is allergic to gluten meal, lactose, ranitidine, sulfa antibiotics, amphetamine-dextroamphetamine, lactose intolerance (gi), reglan [metoclopramide], topiramate  er, wheat, and soap. Drug: Ms. Alderman  reports no history of drug use. Alcohol:  reports current alcohol use of about 2.0 - 4.0 standard drinks of alcohol per week. Tobacco:  reports that she quit smoking about 36 years ago. Her smoking use included cigarettes. She started smoking about 41 years ago. She has a 3.8 pack-year smoking history. She has never been exposed to tobacco smoke. She has never used smokeless tobacco. Social: Ms. Scales  reports that she quit smoking about 36 years ago. Her smoking use included cigarettes. She started smoking about 41 years ago. She has a 3.8 pack-year smoking history. She has never been exposed to tobacco smoke. She has never used smokeless tobacco. She reports current alcohol use of about 2.0 - 4.0 standard drinks of alcohol per week. She reports that she does not use drugs. Medical:  has a  past medical history of Fatty liver, Fibromyalgia, GERD (gastroesophageal reflux disease), Hemorrhoids, Hot flashes, Hypertension, IBS (irritable bowel syndrome), LUQ abdominal pain (10/25/2016), Migraines, Miscarriage, Multilevel degenerative disc disease, Neuroma, and  Vocal cord dysfunction. Surgical: Ms. Ecklund  has a past surgical history that includes Elbow Debridement; Foot surgery; Cholecystectomy; Colonoscopy; Abdominal hysterectomy; Esophagogastroduodenoscopy (egd) with propofol  (N/A, 02/06/2018); Colonoscopy with propofol  (N/A, 12/22/2019); Colonoscopy with propofol  (N/A, 12/23/2019); Image guided sinus surgery (N/A, 09/28/2021); Maxillary antrostomy (Bilateral, 09/28/2021); Ethmoidectomy (N/A, 09/28/2021); Sphenoidectomy (Bilateral, 09/28/2021); Colonoscopy with propofol  (N/A, 01/01/2023); and polypectomy (01/01/2023). Family: family history includes Alcohol abuse in her father and son; Arthritis in her son and son; Asthma in her sister; COPD in her father, paternal grandmother, and sister; Cancer in her paternal grandfather and sister; Colon cancer in her sister; Colon polyps in her sister; Depression in her son; Diabetes in her maternal grandmother, mother, and son; Drug abuse in her son; Glaucoma in her mother; Hyperlipidemia in her mother; Hypertension in her mother; Kidney disease in her sister; Mental illness in her sister and son; Miscarriages / Stillbirths in her sister and sister; Uterine cancer in her sister.  Laboratory Chemistry Profile   Renal Lab Results  Component Value Date   BUN 17 12/27/2023   CREATININE 1.14 (H) 12/27/2023   BCR 27 (H) 10/09/2023   GFRAA >60 07/07/2019   GFRNONAA 55 (L) 12/27/2023    Hepatic Lab Results  Component Value Date   AST 21 10/09/2023   ALT 41 (H) 10/09/2023   ALBUMIN 4.5 10/09/2023   ALKPHOS 84 10/09/2023   LIPASE 33 12/15/2016    Electrolytes Lab Results  Component Value Date   NA 141 12/27/2023   K 3.7 12/27/2023   CL 103 12/27/2023   CALCIUM 9.4 12/27/2023   MG 2.2 01/16/2018    Bone Lab Results  Component Value Date   25OHVITD1 51 01/16/2018   25OHVITD2 <1.0 01/16/2018   25OHVITD3 50 01/16/2018    Inflammation (CRP: Acute Phase) (ESR: Chronic Phase) Lab Results  Component Value Date   CRP 0.8  07/05/2021   ESRSEDRATE 17 07/05/2021         Note: Above Lab results reviewed.  Recent Imaging Review  DG Chest 2 View CLINICAL DATA:  Shortness of breath and swelling in the lower extremities.  EXAM: CHEST - 2 VIEW  COMPARISON:  Chest PA Lat 07/01/2021  FINDINGS: The heart size and mediastinal contours are within normal limits. There calcifications in the transverse aorta. Both lungs are clear. The visualized skeletal structures are intact with mild thoracic kyphosis and spondylosis.  IMPRESSION: No evidence of acute chest disease. Stable chest with aortic atherosclerosis.  Electronically Signed   By: Francis Quam M.D.   On: 12/14/2023 20:40 Note: Reviewed        Physical Exam  Vitals: BP 128/86   Pulse (!) 102   Temp (!) 97.2 F (36.2 C)   Ht 5' 2 (1.575 m)   Wt 185 lb (83.9 kg)   SpO2 99%   BMI 33.84 kg/m  BMI: Estimated body mass index is 33.84 kg/m as calculated from the following:   Height as of this encounter: 5' 2 (1.575 m).   Weight as of this encounter: 185 lb (83.9 kg). Ideal: Ideal body weight: 50.1 kg (110 lb 7.2 oz) Adjusted ideal body weight: 63.6 kg (140 lb 4.3 oz) General appearance: Well nourished, well developed, and well hydrated. In no apparent acute distress Mental status: Alert, oriented x 3 (person, place, & time)  Respiratory: No evidence of acute respiratory distress Eyes: PERLA   Assessment   Diagnosis Status  1. Cervicalgia (Bilateral) (R>L)   2. Cervicogenic headache   3. Chronic upper extremity pain (4th area of Pain) (Bilateral) (R>L)   4. 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 DDD (degenerative disc disease), cervical   6. Chronic pain syndrome   7. Encounter for medication management    Controlled Controlled Controlled   Updated Problems: No problems updated.  Plan of Care  Problem-specific:  Assessment and Plan Prescribing drug monitoring (PDMP) reviewed; finding consistent with  the use of prescribed medication and no evidence of other narcotic misuse or abuse. Schedule in 2 months with Mattheus Rauls NP (PRN).   Ms. Yarah Fuente has a current medication list which includes the following long-term medication(s): amlodipine , furosemide , ipratropium, lansoprazole , linzess , naloxone , oxycodone , potassium chloride  sa, promethazine , rizatriptan , and triamterene -hydrochlorothiazide .  Pharmacotherapy (Medications Ordered): No orders of the defined types were placed in this encounter.  Orders:  No orders of the defined types were placed in this encounter.       Return in about 2 months (around 03/09/2024) for (F2F), (MM), Emmy Blanch NP-PRN.    Recent Visits Date Type Provider Dept  12/19/23 Office Visit Tanya Glisson, MD [facility]  11/08/23 Office Visit Abdurrahman Petersheim K, NP [facility]  10/30/23 Procedure visit Tanya Glisson, MD [facility]  Showing recent visits within past 90 days and meeting all other requirements Today's Visits Date Type Provider Dept  01/08/24 Office Visit Jurrell Royster K, NP [facility]  Showing today's visits and meeting all other requirements Future Appointments No visits were found meeting these conditions. Showing future appointments within next 90 days and meeting all other requirements  I discussed the assessment and treatment plan with the patient. The patient was provided an opportunity to ask questions and all were answered. The patient agreed with the plan and demonstrated an understanding of the instructions.  Patient advised to call back or seek an in-person evaluation if the symptoms or condition worsens.  Duration of encounter: 20 minutes.  Total time on encounter, as per AMA guidelines included both the face-to-face and non-face-to-face time personally spent by the physician and/or other qualified health care professional(s) on the day of the encounter (includes time in  activities that require the physician or other qualified health care professional and does not include time in activities normally performed by clinical staff). Physician's time may include the following activities when performed: Preparing to see the patient (e.g., pre-charting review of records, searching for previously ordered imaging, lab work, and nerve conduction tests) Review of prior analgesic pharmacotherapies. Reviewing PMP Interpreting ordered tests (e.g., lab work, imaging, nerve conduction tests) Performing post-procedure evaluations, including interpretation of diagnostic procedures Obtaining and/or reviewing separately obtained history Performing a medically appropriate examination and/or evaluation Counseling and educating the patient/family/caregiver Ordering medications, tests, or procedures Referring and communicating with other health care professionals (when not separately reported) Documenting clinical information in the electronic or other health record Independently interpreting results (not separately reported) and communicating results to the patient/ family/caregiver Care coordination (not separately reported)  Note by: Meiko Stranahan K Juanjose Mojica, NP (TTS and AI technology used. I apologize for any typographical errors that were not detected and corrected.) Date: 01/08/2024; Time: 12:00 PM

## 2024-01-08 NOTE — Progress Notes (Signed)
 Safety precautions to be maintained throughout the outpatient stay will include: orient to surroundings, keep bed in low position, maintain call bell within reach at all times, provide assistance with transfer out of bed and ambulation.   Nursing Pain Medication Assessment:  Safety precautions to be maintained throughout the outpatient stay will include: orient to surroundings, keep bed in low position, maintain call bell within reach at all times, provide assistance with transfer out of bed and ambulation.  Medication Inspection Compliance: Pill count conducted under aseptic conditions, in front of the patient. Neither the pills nor the bottle was removed from the patient's sight at any time. Once count was completed pills were immediately returned to the patient in their original bottle.  Medication: Oxycodone  IR Pill/Patch Count: 39 of 42 pills/patches remain Pill/Patch Appearance: Markings consistent with prescribed medication Bottle Appearance: Standard pharmacy container. Clearly labeled. Filled Date: 7 / 16 / 2025 Last Medication intake:  3-4 days ago

## 2024-01-10 ENCOUNTER — Encounter: Admitting: Family

## 2024-01-11 ENCOUNTER — Ambulatory Visit (HOSPITAL_COMMUNITY)
Admission: RE | Admit: 2024-01-11 | Discharge: 2024-01-11 | Disposition: A | Discharge status: Home / Self Care | Payer: Self-pay | Source: Ambulatory Visit | Attending: Family | Admitting: Family

## 2024-01-11 DIAGNOSIS — I110 Hypertensive heart disease with heart failure: Secondary | ICD-10-CM | POA: Insufficient documentation

## 2024-01-11 DIAGNOSIS — I10 Essential (primary) hypertension: Secondary | ICD-10-CM

## 2024-01-11 DIAGNOSIS — I509 Heart failure, unspecified: Secondary | ICD-10-CM | POA: Diagnosis not present

## 2024-01-11 LAB — ECHOCARDIOGRAM COMPLETE
Area-P 1/2: 2.42 cm2
Calc EF: 60.2 %
S' Lateral: 3.05 cm
Single Plane A2C EF: 64.7 %
Single Plane A4C EF: 55.3 %

## 2024-01-14 ENCOUNTER — Other Ambulatory Visit (HOSPITAL_BASED_OUTPATIENT_CLINIC_OR_DEPARTMENT_OTHER): Payer: Self-pay

## 2024-01-16 ENCOUNTER — Telehealth: Payer: Self-pay | Admitting: Family

## 2024-01-16 NOTE — Progress Notes (Signed)
 Advanced [facility] Failure Clinic Note    PCP: Myrla Jon HERO, MD Cardiologist: None  HF provider: Gardenia Led, MD  Chief Complaint: shortness of breath   HPI:  Elizabeth Bowers is a 58 y/o female with a history of HTN, headaches (migraine), DDD and shortness of breath.   Was in the ED 12/14/23 with leg swelling, weight gain as well as intermittent dyspnea on exertion for last several weeks. BNP normal. First troponin negative. D-dimer negative. Second troponin negative.   Was seen in the [facility] 12/19/23 where diuretics were adjusted.   Echo 01/11/24: EF 55-60%, normal RV, no valvular disease, dilated IVC  She presents today for a HF follow-up visit with a chief complaint of shortness of breath. Has associated fatigue, severe headaches, worsening pedal edema. Denies chest pain, palpitations, dizziness. Says that she will be having neck surgery to relive the bulging disc as soon as she can get it scheduled.   Says that she was let go from her employer due to her current inability to work due to the severe HA's. Currently has COBRA insurance.   ROS: All systems negative except what is listed in HPI, PMH and Problem List   Past Medical History:  Diagnosis Date   Fatty liver    Fibromyalgia    GERD (gastroesophageal reflux disease)    Hemorrhoids    Hot flashes    Hypertension    IBS (irritable bowel syndrome)    LUQ abdominal pain 10/25/2016   Migraines    Miscarriage    x 2; 4 live births    Multilevel degenerative disc disease    Neuroma    Vocal cord dysfunction     Current Outpatient Medications  Medication Sig Dispense Refill   amLODipine  (NORVASC ) 10 MG tablet Take 1 tablet (10 mg) by mouth daily. 90 tablet 1   aspirin-acetaminophen -caffeine  (EXCEDRIN  MIGRAINE) 250-250-65 MG tablet Take 1 tablet by mouth every 6 (six) hours as needed for headache. 30 tablet 0   butalbital -acetaminophen -caffeine  (FIORICET ) 50-325-40 MG tablet Take 1-2 tablets by mouth every 6 (six) hours  as needed for headache. 20 tablet 0   furosemide  (LASIX ) 20 MG tablet Take 1 tablet (20 mg total) by mouth daily. 90 tablet 1   hydrOXYzine  (ATARAX ) 10 MG tablet Take 1 tablet (10 mg total) by mouth 3 (three) times daily as needed. 60 tablet 1   ipratropium (ATROVENT ) 0.06 % nasal spray Place 2 sprays into both nostrils 4 (four) times daily. 15 mL 12   ketorolac  (TORADOL ) 10 MG tablet Take 1 tablet (10 mg total) by mouth every 6 (six) hours as needed. 20 tablet 0   lansoprazole  (PREVACID ) 30 MG capsule Take 1 capsule (30 mg total) by mouth 2 (two) times daily before a meal. 180 capsule 1   LINZESS  290 MCG CAPS capsule Take 1 capsule (290 mcg total) by mouth daily. 90 capsule 1   Multiple Vitamin (MULTIVITAMIN WITH MINERALS) TABS tablet Take 1 tablet by mouth daily.     naloxone  (NARCAN ) nasal spray 4 mg/0.1 mL Place 1 spray into the nose as needed for up to 365 doses (for opioid-induced respiratory depresssion). In case of emergency (overdose), spray once into each nostril. If no response within 3 minutes, repeat application and call 911. 1 each 1   ondansetron  (ZOFRAN -ODT) 4 MG disintegrating tablet Take 1 tablet (4 mg total) by mouth every 8 (eight) hours as needed for nausea or vomiting. 20 tablet 0   oxyCODONE  (ROXICODONE ) 5 MG immediate release tablet Take  1 tablet (5 mg total) by mouth every 8 (eight) hours as needed. Must last 30 days 42 tablet 0   potassium chloride  SA (KLOR-CON  M) 20 MEQ tablet Take 1 tablet (20 mEq total) by mouth daily. 90 tablet 1   promethazine  (PHENERGAN ) 25 MG tablet Take 1 tablet (25 mg total) by mouth every 6 (six) hours as needed for nausea or vomiting. 20 tablet 0   promethazine -dextromethorphan (PROMETHAZINE -DM) 6.25-15 MG/5ML syrup Take 5 mLs by mouth 4 (four) times daily as needed. 118 mL 0   rizatriptan  (MAXALT -MLT) 5 MG disintegrating tablet Dissolve 1 tablet (5 mg total) in the mouth as directed for Migraine. May take a second dose after 2 hours if needed. 6  tablet 1   triamterene -hydrochlorothiazide  (MAXZIDE -25) 37.5-25 MG tablet Take 1 tablet by mouth daily. 90 tablet 1   No current facility-administered medications for this visit.    Allergies  Allergen Reactions   Gluten Meal     Other reaction(s): Abdominal Pain, Myalgias (Muscle Pain)    Lactose     Other reaction(s): Abdominal Pain, Myalgias (Muscle Pain)    Ranitidine Diarrhea and Other (See Comments)    Diarrhea and stomach cramping   Sulfa Antibiotics Anxiety   Amphetamine-Dextroamphetamine Other (See Comments)    Elevated BP   Lactose Intolerance (Gi)     bloating   Reglan [Metoclopramide] Other (See Comments)    Increased prolactin level    Topiramate  Er Other (See Comments)    Severe headache, nausea, vomiting, dizziness, visual disturbance     Wheat     bloating   Soap Rash    PROVON excess use      Social History   Socioeconomic History   Marital status: Married    Spouse name: [name]   Number of children: 6   Years of education: 14   Highest education level: Associate degree: occupational, Scientist, product/process development, or vocational program  Occupational History   Occupation: Science writer: [employer]  Tobacco Use   Smoking status: Former    Current packs/day: 0.00    Average packs/day: 0.8 packs/day for 5.0 years (3.8 ttl pk-yrs)    Types: Cigarettes    Start date: 07/05/1982    Quit date: 07/06/1987    Years since quitting: 36.5    Passive exposure: Never   Smokeless tobacco: Never  Vaping Use   Vaping status: Never Used  Substance and Sexual Activity   Alcohol use: Yes    Alcohol/week: 2.0 - 4.0 standard drinks of alcohol    Types: 2 - 4 Cans of beer per week    Comment: wine cooler   Drug use: Never   Sexual activity: Not on file  Other Topics Concern   Not on file  Social History Narrative   Not on file   Social Drivers of Health   Financial Resource Strain: Low Risk  (12/13/2023)   Received from [facility]   Overall  Financial Resource Strain (CARDIA)    Difficulty of Paying Living Expenses: Not very hard  Food Insecurity: No Food Insecurity (12/13/2023)   Received from [facility]   Hunger Vital Sign    Within the past 12 months, you worried that your food would run out before you got the money to buy more.: Never true    Within the past 12 months, the food you bought just didn't last and you didn't have money to get more.: Never true  Transportation Needs: No Transportation Needs (12/13/2023)  Received from [facility] - Transportation    In the past 12 months, has lack of transportation kept you from medical appointments or from getting medications?: No    Lack of Transportation (Non-Medical): No  Physical Activity: Unknown (09/27/2023)   Exercise Vital Sign    Days of Exercise per Week: Patient declined    Minutes of Exercise per Session: Not on file  Stress: Stress Concern Present (09/27/2023)   [employer] of Occupational Health - Occupational Stress Questionnaire    Feeling of Stress : Very much  Social Connections: Unknown (09/27/2023)   Social Connection and Isolation Panel    Frequency of Communication with Friends and Family: Patient declined    Frequency of Social Gatherings with Friends and Family: Patient declined    Attends Religious Services: Patient declined    Database administrator or Organizations: No    Attends Engineer, structural: Not on file    Marital Status: Married  Catering manager Violence: Not on file      Family History  Problem Relation Age of Onset   Hypertension Mother    Hyperlipidemia Mother    Glaucoma Mother    Diabetes Mother        type 2    COPD Father        emphysema   Alcohol abuse Father    Colon cancer Sister    Cancer Sister        rectal/colon cancer no h/o IBD   Colon polyps Sister    Asthma Sister    COPD Sister    Kidney disease Sister    Mental illness Sister        bipolar     Miscarriages / [place] Sister    Uterine cancer Sister    Alcohol abuse Son    Depression Son    Diabetes Son    Drug abuse Son    Mental illness Son        bipolar    Diabetes Maternal Grandmother    COPD Paternal Grandmother    Cancer Paternal Grandfather        FH colon cancer maternal aunts/uncles    Miscarriages / Stillbirths Sister    Arthritis Son    Arthritis Son    Breast cancer Neg Hx    Vitals:   01/17/24 1418  BP: 130/74  Pulse: 87  SpO2: 98%  Weight: 192 lb (87.1 kg)   Wt Readings from Last 3 Encounters:  01/17/24 192 lb (87.1 kg)  01/08/24 185 lb (83.9 kg)  12/27/23 189 lb (85.7 kg)   Lab Results  Component Value Date   CREATININE 1.14 (H) 12/27/2023   CREATININE 1.06 (H) 12/19/2023   CREATININE 0.95 12/14/2023    PHYSICAL EXAM:  General: Well appearing although sitting with eyes closed and light off in room due to headache Cor: No JVD. Regular rhythm, rate.  Lungs: clear Abdomen: soft, nontender, nondistended. Extremities: 1+ pitting edema bilateral lower legs Neuro:. Affect pleasant, tearful when discussing her job loss   ECG: not done  ReDs reading: 25 %, normal   ASSESSMENT & PLAN:  1: Shortness of breath- - unclear etiology of shortness of breath/ edema. No signs of HF on echo - NYHA class III - moderately fluid overloaded with symptoms/ edema - Reds 25% as her fluid retention is lower legs - Echo 01/11/24: EF 55-60%, normal RV, no valvular disease, dilated IVC. Reviewed this with patient today - weight up 3  pounds from last visit 3 weeks ago - increase furosemide  to 40mg  daily - increase potassium to 40meq daily - BMET in 2 weeks - BNP 12/19/23 was 10.8  2: HTN- - BP 130/74 - continue amlodipine , maxide - saw PCP Lollie) 07/25  - BMET 12/27/23 reviewed: sodium 141, potassium 3.7, creatinine 1.14 and GFR 55  3: Cervicalgia- - to have cervical surgery in the near future - says that her HA's are a result of her disc  disease   Return in 1 month, sooner if needed.   Ellouise DELENA Class, FNP 01/16/24

## 2024-01-16 NOTE — Telephone Encounter (Signed)
 Called to confirm/remind patient of their appointment at the [facility] on 01/17/24.   Appointment:   [x] Confirmed  [] Left mess   [] No answer/No voice mail  [] VM Full/unable to leave message  [] Phone not in service  Patient reminded to bring all medications and/or complete list.  Confirmed patient has transportation. Gave directions, instructed to utilize valet parking.

## 2024-01-17 ENCOUNTER — Ambulatory Visit: Attending: Family | Admitting: Family

## 2024-01-17 ENCOUNTER — Encounter: Payer: Self-pay | Admitting: Family

## 2024-01-17 VITALS — BP 130/74 | HR 87 | Wt 192.0 lb

## 2024-01-17 DIAGNOSIS — Z79899 Other long term (current) drug therapy: Secondary | ICD-10-CM | POA: Insufficient documentation

## 2024-01-17 DIAGNOSIS — R519 Headache, unspecified: Secondary | ICD-10-CM | POA: Diagnosis not present

## 2024-01-17 DIAGNOSIS — Z87891 Personal history of nicotine dependence: Secondary | ICD-10-CM | POA: Diagnosis not present

## 2024-01-17 DIAGNOSIS — R600 Localized edema: Secondary | ICD-10-CM | POA: Insufficient documentation

## 2024-01-17 DIAGNOSIS — I10 Essential (primary) hypertension: Secondary | ICD-10-CM | POA: Insufficient documentation

## 2024-01-17 DIAGNOSIS — R0602 Shortness of breath: Secondary | ICD-10-CM | POA: Diagnosis not present

## 2024-01-17 DIAGNOSIS — M7989 Other specified soft tissue disorders: Secondary | ICD-10-CM | POA: Diagnosis not present

## 2024-01-17 DIAGNOSIS — R0609 Other forms of dyspnea: Secondary | ICD-10-CM | POA: Insufficient documentation

## 2024-01-17 DIAGNOSIS — M542 Cervicalgia: Secondary | ICD-10-CM | POA: Diagnosis not present

## 2024-01-17 DIAGNOSIS — R5383 Other fatigue: Secondary | ICD-10-CM | POA: Diagnosis not present

## 2024-01-17 MED ORDER — FUROSEMIDE 20 MG PO TABS: 40.0000 mg | ORAL_TABLET | Freq: Every day | ORAL | Status: DC

## 2024-01-17 MED ORDER — POTASSIUM CHLORIDE CRYS ER 20 MEQ PO TBCR: 40.0000 meq | EXTENDED_RELEASE_TABLET | Freq: Every day | ORAL | Status: DC

## 2024-01-17 NOTE — Patient Instructions (Signed)
 Increase lasix  to 2 tablets every day.   Increase potassium to 2 tablets every day   Go to the registration desk in 2 weeks to check in for your lab work. Order has been placed.

## 2024-01-17 NOTE — Progress Notes (Signed)
 ReDS Vest / Clip - 01/17/24 1418       ReDS Vest / Clip   Station Marker B    Ruler Value 35    ReDS Value Range Low volume    ReDS Actual Value 25

## 2024-01-17 NOTE — Telephone Encounter (Addendum)
 Upon review of Cardiology's note from today, it appears they wanted to check labs and have her come back in 1 month. I have sent a surgery clearance request form to cardiology. Will wait for clearance from them prior to choosing a surgery date.

## 2024-01-18 ENCOUNTER — Telehealth: Payer: Self-pay

## 2024-01-18 ENCOUNTER — Other Ambulatory Visit: Payer: Self-pay

## 2024-01-18 DIAGNOSIS — Z01818 Encounter for other preprocedural examination: Secondary | ICD-10-CM

## 2024-01-18 DIAGNOSIS — M5412 Radiculopathy, cervical region: Secondary | ICD-10-CM

## 2024-01-18 NOTE — Telephone Encounter (Signed)
   Pre-operative Risk Assessment    Patient Name: Elizabeth Bowers  DOB: 12-24-1965 MRN: [id number]   Date of last office visit: 01/17/24 ELLOUISE CLASS, FNP ([facility]) Date of next office visit: NONE   Request for Surgical Clearance    Procedure:  C5-6 anterior cervical discectomy and fusion  Date of Surgery:  Clearance TBD                                Surgeon:  Reeves Daisy, MD Surgeon's Group or Practice Name:  [facility] at [facility] number:  [phone] Fax number:  [phone]   Type of Clearance Requested:   - Medical    Type of Anesthesia:  General    Additional requests/questions:    SignedLucie DELENA Ku   01/18/2024, 7:35 AM       Letter  Jean, Kendelyn, RN on 01/17/2024     [facility] at [facility] [address], [address] [place], [place]  [address] Phone:  [phone]   Fax:  [phone]     Patient: Elizabeth Bowers      DOB: 1966/02/21   Date sent: 01/17/2024             Faxed to: Davene Nicolas   Surgeon: Reeves Daisy, MD    Date of procedure: to be determined once cleared   Planned procedure & Anesthesia Type: C5-6 anterior cervical discectomy and fusion/general anesthesia     Should you choose to see this patient in your office to provide clearance, please ask your office staff to contact the patient directly.  Please fax your evaluation and any supporting documentation as soon as completed.  Thank you! Your assistance is greatly appreciated!       Is this patient optimized to have the above procedure?   Yes  or  No   If NO, please indicate further studies/evaluations recommended       Risk:  Low   Moderate   High     Remarks:        Physician Name: _______________________________      Physician Signature: _______________________________ Date:________________

## 2024-01-18 NOTE — Telephone Encounter (Signed)
 Elizabeth Bowers,  You saw this patient on 01/17/2024. Per protocol we request that you comment on his cardiac risk to proceed with  C5-6 anterior cervical discectomy and fusion  since it has been less than 2 months since evaluated in the office. Please send your comment to P CV Pre-Op Pool.  Thank you, Lamarr Satterfield DNP, ANP, AACC.

## 2024-01-18 NOTE — Telephone Encounter (Signed)
 I spoke with the patient. We discussed the information below.   Planned surgery: C5-6 anterior cervical discectomy and fusion   Surgery date: 02/06/24 at [facility]: [address] [place], [place] [address]) - you will find out your arrival time the business day before your surgery.   Pre-op appointment at [facility] Pre-admit Testing: you will receive a call with a date/time for this appointment. If you are scheduled for an in person appointment, Pre-admit Testing is located on the first floor of the [facility] building, [address], [address]. During this appointment, they will advise you which medications you can take the morning of surgery, and which medications you will need to hold for surgery. Labs (such as blood work, EKG) may be done at your pre-op appointment. You are not required to fast for these labs. Should you need to change your pre-op appointment, please call Pre-admit testing at [phone].     Please avoid Excedrin  Migraine for 1 week before and 2 weeks after surgery (due to the high amount of aspirin in it)   Surgical clearance: we have received clearance from your cardiologist.    NSAIDS (Non-steroidal anti-inflammatory drugs): because you are having a fusion, please avoid taking any NSAIDS (examples: ibuprofen, motrin, aleve, naproxen, meloxicam , diclofenac) for 3 months after surgery. Celebrex is an exception and is OK to take, if prescribed. Tylenol  is not an NSAID.    Common restrictions after spine surgery: No bending, lifting, or twisting ("BLT"). Avoid lifting objects heavier than 10 pounds for the first 6 weeks after surgery. Where possible, avoid household activities that involve lifting, bending, reaching, pushing, or pulling such as laundry, vacuuming, grocery shopping, and childcare. Try to arrange for help from friends and family for these activities while you heal. Do not drive while taking  prescription pain medication. Weeks 6 through 12 after surgery: avoid lifting more than 25 pounds.    X-rays after surgery: Because you are having a fusion: for appointments after your 2 week follow-up: please arrive at the [facility] outpatient imaging center ([address], [place]) or [facility] one hour prior to your appointment for x-rays. This applies to every appointment after your 2 week follow-up. Failure to do so may result in your appointment being rescheduled. *We recently started construction to have x-ray in our office. This may be completed by the time you come in for your 6 week post-op appointment. Please check with us  closer to that time to see if you can have your x-rays at our office*    How to contact us :  If you have any questions/concerns before or after surgery, you can reach us  at [phone], or you can send a mychart message. We can be reached by phone or mychart 8am-4pm, Monday-Friday.  *Please note: Calls after 4pm are forwarded to a third party answering service. Mychart messages are not routinely monitored during evenings, weekends, and holidays. Please call our office to contact the answering service for urgent concerns during non-business hours.   If you have FMLA/disability paperwork, please drop it off or fax it to [phone]   Appointments/FMLA & disability paperwork: Reche [employer] Registered Nurse/Surgery scheduler: Kirsten Spearing, RN Certified Medical Assistants: Don, CMA, Elenor, CMA, & Damien, CMA Physician Assistants: Lyle Decamp, PA-C, Edsel Goods, PA-C & Glade Boys, PA-C Surgeons: Penne Sharps, MD & Reeves Daisy, MD   [facility] PREADMIT TESTING VISIT and SURGERY INFORMATION SHEET   Now  that surgery has been scheduled you can anticipate several phone calls from [facility]. A pharmacy technician will call you to verify your current list of medications taken at  home.               The Pre-Service Center will call to verify your insurance information and to give you billing estimates and information.             The Preadmit Testing Office will be calling to schedule a visit to obtain information for the anesthesia team and provide instructions on preparation for surgery.  What can you expect for the Preadmit Testing Visit: Appointments may be scheduled in-person or by telephone.  If a telephone visit is scheduled, you may be asked to come into the office to have lab tests or other studies performed.   This visit will not be completed any greater than 14 days prior to your surgery.  If your surgery has been scheduled for a future date, please do not be alarmed if we have not contacted you to schedule an appointment more than a month prior to the surgery date.    Please be prepared to provide the following information during this appointment:            -Personal medical history                                               -Medication and allergy  list            -Any history of problems with anesthesia              -Recent lab work or diagnostic studies            -Please notify us  of any needs we should be aware of to provide the best care possible           -You will be provided with instructions on how to prepare for your surgery.    On The Day of Surgery:  You must have a driver to take you home after surgery, you will be asked not to drive for 24 hours following surgery.  Taxi, Gisele and non-medical transport will not be acceptable means of transportation unless you have a responsible individual who will be traveling with you.  Visitors in the surgical area:   2 people will be able to visit you in your room once your preparation for surgery has been completed. During surgery, your visitors will be asked to wait in the Surgery Waiting Area.  It is not a requirement for them to stay, if they prefer to leave and come back.  Your visitor(s) will be  given an update once the surgery has been completed.  No visitors are allowed in the initial recovery room to respect patient privacy and safety.  Once you are more awake and transfer to the secondary recovery area, or are transferred to an inpatient room, visitors will again be able to see you.  To respect and protect your privacy: We will ask on the day of surgery who your driver will be and what the contact number for that individual will be. We will ask if it is okay to share information with this individual, or if there is an alternative individual that we, or the surgeon, should contact to provide updates and information. If family or  friends come to the surgical information desk requesting information about you, who you have not listed with us , no information will be given.   It may be helpful to designate someone as the main contact who will be responsible for updating your other friends and family.    PREADMIT TESTING OFFICE: [phone] SAME DAY SURGERY: [phone] We look forward to caring for you before and throughout the process of your surgery.

## 2024-01-18 NOTE — Telephone Encounter (Signed)
   Patient Name: Elizabeth Bowers  DOB: 27-Aug-1965 MRN: [id number]  Primary Cardiologist: None  Chart reviewed as part of pre-operative protocol coverage. Given past medical history and time since last visit, based on ACC/AHA guidelines, Elizabeth Bowers is at acceptable risk for the planned procedure without further cardiovascular testing.   Her RCRI score is 0.5% risk of cardiac event. Patient is at moderate risk due to HTN and pedal edema resulting in diuretic currently being adjusted.   The patient was advised that if she develops new symptoms prior to surgery to contact our office to arrange for a follow-up visit, and she verbalized understanding.  I will route this recommendation to the requesting party via Epic fax function and remove from pre-op pool.  Please call with questions.  Lamarr Satterfield, NP 01/18/2024, 12:47 PM

## 2024-01-23 ENCOUNTER — Other Ambulatory Visit (HOSPITAL_COMMUNITY): Payer: Self-pay

## 2024-01-23 ENCOUNTER — Other Ambulatory Visit: Payer: Self-pay

## 2024-01-23 ENCOUNTER — Other Ambulatory Visit: Payer: Self-pay | Admitting: Pain Medicine

## 2024-01-23 DIAGNOSIS — R112 Nausea with vomiting, unspecified: Secondary | ICD-10-CM

## 2024-01-23 MED ORDER — RIZATRIPTAN BENZOATE 5 MG PO TBDP
5.0000 mg | ORAL_TABLET | ORAL | 1 refills | Status: AC
  Filled 2024-01-23: qty 6, 15d supply, fill #0
  Filled 2024-03-03: qty 6, 15d supply, fill #1

## 2024-01-27 ENCOUNTER — Encounter: Payer: Self-pay | Admitting: Family Medicine

## 2024-01-27 DIAGNOSIS — R112 Nausea with vomiting, unspecified: Secondary | ICD-10-CM

## 2024-01-28 ENCOUNTER — Encounter
Admission: RE | Admit: 2024-01-28 | Discharge: 2024-01-28 | Disposition: A | Discharge status: Home / Self Care | Source: Ambulatory Visit | Attending: Neurosurgery | Admitting: Neurosurgery

## 2024-01-28 ENCOUNTER — Other Ambulatory Visit: Payer: Self-pay

## 2024-01-28 ENCOUNTER — Encounter: Payer: Self-pay | Admitting: Neurosurgery

## 2024-01-28 ENCOUNTER — Ambulatory Visit: Payer: Self-pay | Admitting: Urgent Care

## 2024-01-28 VITALS — BP 115/78 | HR 76 | Temp 98.9°F | Resp 16 | Ht 62.0 in | Wt 187.2 lb

## 2024-01-28 DIAGNOSIS — Z6832 Body mass index (BMI) 32.0-32.9, adult: Secondary | ICD-10-CM | POA: Insufficient documentation

## 2024-01-28 DIAGNOSIS — I2510 Atherosclerotic heart disease of native coronary artery without angina pectoris: Secondary | ICD-10-CM | POA: Insufficient documentation

## 2024-01-28 DIAGNOSIS — I10 Essential (primary) hypertension: Secondary | ICD-10-CM | POA: Diagnosis not present

## 2024-01-28 DIAGNOSIS — E66811 Obesity, class 1: Secondary | ICD-10-CM | POA: Insufficient documentation

## 2024-01-28 DIAGNOSIS — Z22322 Carrier or suspected carrier of Methicillin resistant Staphylococcus aureus: Secondary | ICD-10-CM

## 2024-01-28 DIAGNOSIS — Z01812 Encounter for preprocedural laboratory examination: Secondary | ICD-10-CM | POA: Diagnosis not present

## 2024-01-28 DIAGNOSIS — Z01818 Encounter for other preprocedural examination: Secondary | ICD-10-CM | POA: Diagnosis present

## 2024-01-28 HISTORY — DX: Personal history of other mental and behavioral disorders: Z86.59

## 2024-01-28 HISTORY — DX: Carrier or suspected carrier of methicillin resistant Staphylococcus aureus: Z22.322

## 2024-01-28 HISTORY — DX: Anxiety disorder, unspecified: F41.9

## 2024-01-28 HISTORY — DX: Cervicogenic headache: G44.86

## 2024-01-28 HISTORY — DX: Radiculopathy, cervical region: M54.12

## 2024-01-28 HISTORY — DX: Other chronic pain: G89.29

## 2024-01-28 HISTORY — DX: Chronic pain syndrome: G89.4

## 2024-01-28 HISTORY — DX: Localized edema: R60.0

## 2024-01-28 HISTORY — DX: Other articular cartilage disorders, unspecified shoulder: M24.119

## 2024-01-28 HISTORY — DX: Cervicalgia: M54.2

## 2024-01-28 HISTORY — DX: Shortness of breath: R06.02

## 2024-01-28 HISTORY — DX: Other cervical disc degeneration, unspecified cervical region: M50.30

## 2024-01-28 HISTORY — DX: Spinal stenosis, cervical region: M48.02

## 2024-01-28 LAB — BASIC METABOLIC PANEL WITH GFR
Anion gap: 9 (ref 5–15)
BUN: 13 mg/dL (ref 6–20)
CO2: 28 mmol/L (ref 22–32)
Calcium: 9.8 mg/dL (ref 8.9–10.3)
Chloride: 102 mmol/L (ref 98–111)
Creatinine, Ser: 1.02 mg/dL — ABNORMAL HIGH (ref 0.44–1.00)
GFR, Estimated: >60 mL/min (ref >60)
Glucose, Bld: 99 mg/dL (ref 70–99)
Potassium: 3.5 mmol/L (ref 3.5–5.1)
Sodium: 139 mmol/L (ref 135–145)

## 2024-01-28 LAB — SURGICAL PCR SCREEN
MRSA, PCR: POSITIVE — AB
Staphylococcus aureus: POSITIVE — AB

## 2024-01-28 LAB — TYPE AND SCREEN
ABO/RH(D): A POS
Antibody Screen: NEGATIVE

## 2024-01-28 MED ORDER — MUPIROCIN 2 % EX OINT
TOPICAL_OINTMENT | CUTANEOUS | 0 refills | Status: DC
  Filled 2024-01-28: qty 22, 5d supply, fill #0

## 2024-01-28 NOTE — Telephone Encounter (Signed)
**Note De-identified  [place] Obfuscation** Please advise 

## 2024-01-28 NOTE — Progress Notes (Signed)
  Perioperative Services  Abnormal Lab Notification and Treatment Plan of Care  Date: 01/28/24  Name: Elizabeth Bowers DOB: 08-02-1965 MRN:   [id number]  Re: Abnormal labs noted during PAT appointment  Provider Notified: Clois Fret, MD Notification mode: Routed and/or faxed via [facility]  Labs of concern: Lab Results  Component Value Date   STAPHAUREUS POSITIVE (A) 01/28/2024   MRSAPCR POSITIVE (A) 01/28/2024    Notes: Patient is scheduled for a ANTERIOR CERVICAL DECOMPRESSION/DISCECTOMY FUSION 1 LEVEL on 02/06/2024. She is scheduled to receive CEFAZOLIN pre-operatively. Pre-surgical PCR (+) for MRSA; see above.  PLANS:  Review renal function. Estimated Creatinine Clearance: 60 mL/min (A) (by C-G formula based on SCr of 1.02 mg/dL (H)).  Review allergies. No documented allergy  to vancomycin.  Order added for VANCOMYCIN 1 GRAM IV to current preoperative prophylactic regimen.   Patient with orders for both CEFAZOLIN + VANCOMYCIN to be given in the setting of documented MRSA (+) surgical PCR.   Guidelines suggest that a beta-lactam antibiotic (first or second generation cephalosporin) should be added for activity against gram-negative organisms.  Vancomycin appears to be less effective than cefazolin for preventing SSIs caused by MSSA. For this reason, the use of vancomycin in combination with cefazolin is favored for prevention of SSI due to MRSA and coagulase-negative staphylococci.  Medical history in [facility] updated to reflect (+) PCR result indicating nasal MRSA colonization   Rx for additional preoperative nasal decolonization sent to patient's pharmacy of record.   Meds ordered this encounter  Medications   mupirocin  ointment (BACTROBAN ) 2 %    Sig: Apply small amount to the inside of both nostrils TWICE a day for the next 5 days.    Dispense:  15 g    Refill:  0    Please contact the patient as soon as it is available for pickup. Rx is for preoperative nasal  decolonization following (+) MRSA result on PCR testing. Needs to be started ASAP.   Elizabeth Pereyra, MSN, APRN, FNP-C, CEN [facility]  Perioperative Services Nurse Practitioner Phone: [phone] 01/28/24 2:15 PM

## 2024-01-28 NOTE — Patient Instructions (Signed)
 Your procedure is scheduled on: Wednesday 02/06/24 Report to the Registration Desk on the 1st floor of the [facility]. To find out your arrival time, please call [phone] between 1PM - 3PM on: Tuesday 02/05/24 If your arrival time is 6:00 am, do not arrive before that time as the [facility] entrance doors do not open until 6:00 am.  REMEMBER: Instructions that are not followed completely may result in serious medical risk, up to and including death; or upon the discretion of your surgeon and anesthesiologist your surgery may need to be rescheduled.  Do not eat food after midnight the night before surgery.  No gum chewing or hard candies.  You may however, drink CLEAR liquids up to 2 hours before you are scheduled to arrive for your surgery. Do not drink anything within 2 hours of your scheduled arrival time.  Clear liquids include: - water  - apple juice without pulp - black coffee or tea (Do NOT add milk or creamers to the coffee or tea) Do NOT drink anything that is not on this list.   One week prior to surgery: Stop Anti-inflammatories (NSAIDS) such as Advil, Aleve, Ibuprofen, Motrin, Naproxen, Naprosyn and Aspirin based products such as Excedrin , Goody's Powder, BC Powder. Stop ANY OVER THE COUNTER supplements until after surgery.  You may however, continue to take Tylenol  if needed for pain up until the day of surgery.  Continue taking all of your other prescription medications up until the day of surgery.  ON THE DAY OF SURGERY ONLY TAKE THESE MEDICATIONS WITH SIPS OF WATER:  amLODipine  (NORVASC ) 10 MG  oxyCODONE  (ROXICODONE ) 5 MG (if needed) lansoprazole  (PREVACID ) 30 MG    No Alcohol for 24 hours before or after surgery.  No Smoking including e-cigarettes for 24 hours before surgery.  No chewable tobacco products for at least 6 hours before surgery.  No nicotine patches on the day of surgery.  Do not use any recreational drugs for at least a week (preferably 2  weeks) before your surgery.  Please be advised that the combination of cocaine and anesthesia may have negative outcomes, up to and including death. If you test positive for cocaine, your surgery will be cancelled.  On the morning of surgery brush your teeth with toothpaste and water, you may rinse your mouth with mouthwash if you wish. Do not swallow any toothpaste or mouthwash.  Use CHG Soap or wipes as directed on instruction sheet.  Do not wear jewelry, make-up, hairpins, clips or nail polish.  For welded (permanent) jewelry: bracelets, anklets, waist bands, etc.  Please have this removed prior to surgery.  If it is not removed, there is a chance that hospital personnel will need to cut it off on the day of surgery.  Do not wear lotions, powders, or perfumes.   Do not shave body hair from the neck down 48 hours before surgery.  Contact lenses, hearing aids and dentures may not be worn into surgery.  Do not bring valuables to the hospital. [facility] is not responsible for any missing/lost belongings or valuables.    Notify your doctor if there is any change in your medical condition (cold, fever, infection).  Wear comfortable clothing (specific to your surgery type) to the hospital.  After surgery, you can help prevent lung complications by doing breathing exercises.  Take deep breaths and cough every 1-2 hours. Your doctor may order a device called an Incentive Spirometer to help you take deep breaths.  If you are  being admitted to the hospital overnight, leave your suitcase in the car. After surgery it may be brought to your room.  In case of increased patient census, it may be necessary for you, the patient, to continue your postoperative care in the Same Day Surgery department.  If you are being discharged the day of surgery, you will not be allowed to drive home. You will need a responsible individual to drive you home and stay with you for 24 hours after surgery.   If  you are taking public transportation, you will need to have a responsible individual with you.  Please call the Pre-admissions Testing Dept. at [phone] if you have any questions about these instructions.  Surgery Visitation Policy:  Patients having surgery or a procedure may have two visitors.  Children under the age of 31 must have an adult with them who is not the patient.  Inpatient Visitation:    Visiting hours are 7 a.m. to 8 p.m. Up to four visitors are allowed at one time in a patient room. The visitors may rotate out with other people during the day.  One visitor age 64 or older may stay with the patient overnight and must be in the room by 8 p.m.   Merchandiser, retail to address health-related social needs:  [email or web address]    Pre-operative 5 CHG Bath Instructions   You can play a key role in reducing the risk of infection after surgery. Your skin needs to be as free of germs as possible. You can reduce the number of germs on your skin by washing with CHG (chlorhexidine gluconate) soap before surgery. CHG is an antiseptic soap that kills germs and continues to kill germs even after washing.   DO NOT use if you have an allergy  to chlorhexidine/CHG or antibacterial soaps. If your skin becomes reddened or irritated, stop using the CHG and notify one of our RNs at [phone].   Please shower with the CHG soap starting 4 days before surgery using the following schedule:     Please keep in mind the following:  DO NOT shave, including legs and underarms, starting the day of your first shower.   You may shave your face at any point before/day of surgery.  Place clean sheets on your bed the day you start using CHG soap. Use a clean washcloth (not used since being washed) for each shower. DO NOT sleep with pets once you start using the CHG.   CHG Shower Instructions:  If you choose to wash your hair and private area, wash first with your normal  shampoo/soap.  After you use shampoo/soap, rinse your hair and body thoroughly to remove shampoo/soap residue.  Turn the water OFF and apply about 3 tablespoons (45 ml) of CHG soap to a CLEAN washcloth.  Apply CHG soap ONLY FROM YOUR NECK DOWN TO YOUR TOES (washing for 3-5 minutes)  DO NOT use CHG soap on face, private areas, open wounds, or sores.  Pay special attention to the area where your surgery is being performed.  If you are having back surgery, having someone wash your back for you may be helpful. Wait 2 minutes after CHG soap is applied, then you may rinse off the CHG soap.  Pat dry with a clean towel  Put on clean clothes/pajamas   If you choose to wear lotion, please use ONLY the CHG-compatible lotions on the back of this paper.     Additional instructions for the day of surgery: DO NOT  APPLY any lotions, deodorants, cologne, or perfumes.   Put on clean/comfortable clothes.  Brush your teeth.  Ask your nurse before applying any prescription medications to the skin.      CHG Compatible Lotions   Aveeno Moisturizing lotion  Cetaphil Moisturizing Cream  Cetaphil Moisturizing Lotion  Clairol Herbal Essence Moisturizing Lotion, Dry Skin  Clairol Herbal Essence Moisturizing Lotion, Extra Dry Skin  Clairol Herbal Essence Moisturizing Lotion, Normal Skin  Curel Age Defying Therapeutic Moisturizing Lotion with Alpha Hydroxy  Curel Extreme Care Body Lotion  Curel Soothing Hands Moisturizing Hand Lotion  Curel Therapeutic Moisturizing Cream, Fragrance-Free  Curel Therapeutic Moisturizing Lotion, Fragrance-Free  Curel Therapeutic Moisturizing Lotion, Original Formula  Eucerin Daily Replenishing Lotion  Eucerin Dry Skin Therapy Plus Alpha Hydroxy Crme  Eucerin Dry Skin Therapy Plus Alpha Hydroxy Lotion  Eucerin Original Crme  Eucerin Original Lotion  Eucerin Plus Crme Eucerin Plus Lotion  Eucerin TriLipid Replenishing Lotion  Keri Anti-Bacterial Hand Lotion  Keri Deep  Conditioning Original Lotion Dry Skin Formula Softly Scented  Keri Deep Conditioning Original Lotion, Fragrance Free Sensitive Skin Formula  Keri Lotion Fast Absorbing Fragrance Free Sensitive Skin Formula  Keri Lotion Fast Absorbing Softly Scented Dry Skin Formula  Keri Original Lotion  Keri Skin Renewal Lotion Keri Silky Smooth Lotion  Keri Silky Smooth Sensitive Skin Lotion  Nivea Body Creamy Conditioning Oil  Nivea Body Extra Enriched Lotion  Nivea Body Original Lotion  Nivea Body Sheer Moisturizing Lotion Nivea Crme  Nivea Skin Firming Lotion  NutraDerm 30 Skin Lotion  NutraDerm Skin Lotion  NutraDerm Therapeutic Skin Cream  NutraDerm Therapeutic Skin Lotion  ProShield Protective Hand Cream  Provon moisturizing lotion  How to Use an Incentive Spirometer  An incentive spirometer is a tool that measures how well you are filling your lungs with each breath. Learning to take long, deep breaths using this tool can help you keep your lungs clear and active. This may help to reverse or lessen your chance of developing breathing (pulmonary) problems, especially infection. You may be asked to use a spirometer: After a surgery. If you have a lung problem or a history of smoking. After a long period of time when you have been unable to move or be active. If the spirometer includes an indicator to show the highest number that you have reached, your health care provider or respiratory therapist will help you set a goal. Keep a log of your progress as told by your health care provider. What are the risks? Breathing too quickly may cause dizziness or cause you to pass out. Take your time so you do not get dizzy or light-headed. If you are in pain, you may need to take pain medicine before doing incentive spirometry. It is harder to take a deep breath if you are having pain. How to use your incentive spirometer  Sit up on the edge of your bed or on a chair. Hold the incentive spirometer so that  it is in an upright position. Before you use the spirometer, breathe out normally. Place the mouthpiece in your mouth. Make sure your lips are closed tightly around it. Breathe in slowly and as deeply as you can through your mouth, causing the piston or the ball to rise toward the top of the chamber. Hold your breath for 3-5 seconds, or for as long as possible. If the spirometer includes a coach indicator, use this to guide you in breathing. Slow down your breathing if the indicator goes above the marked areas.  Remove the mouthpiece from your mouth and breathe out normally. The piston or ball will return to the bottom of the chamber. Rest for a few seconds, then repeat the steps 10 or more times. Take your time and take a few normal breaths between deep breaths so that you do not get dizzy or light-headed. Do this every 1-2 hours when you are awake. If the spirometer includes a goal marker to show the highest number you have reached (best effort), use this as a goal to work toward during each repetition. After each set of 10 deep breaths, cough a few times. This will help to make sure that your lungs are clear. If you have an incision on your chest or abdomen from surgery, place a pillow or a rolled-up towel firmly against the incision when you cough. This can help to reduce pain while taking deep breaths and coughing. General tips When you are able to get out of bed: Walk around often. Continue to take deep breaths and cough in order to clear your lungs. Keep using the incentive spirometer until your health care provider says it is okay to stop using it. If you have been in the hospital, you may be told to keep using the spirometer at home. Contact a health care provider if: You are having difficulty using the spirometer. You have trouble using the spirometer as often as instructed. Your pain medicine is not giving enough relief for you to use the spirometer as told. You have a fever. Get  help right away if: You develop shortness of breath. You develop a cough with bloody mucus from the lungs. You have fluid or blood coming from an incision site after you cough. Summary An incentive spirometer is a tool that can help you learn to take long, deep breaths to keep your lungs clear and active. You may be asked to use a spirometer after a surgery, if you have a lung problem or a history of smoking, or if you have been inactive for a long period of time. Use your incentive spirometer as instructed every 1-2 hours while you are awake. If you have an incision on your chest or abdomen, place a pillow or a rolled-up towel firmly against your incision when you cough. This will help to reduce pain. Get help right away if you have shortness of breath, you cough up bloody mucus, or blood comes from your incision when you cough. This information is not intended to replace advice given to you by your health care provider. Make sure you discuss any questions you have with your health care provider. Document Revised: 08/11/2019 Document Reviewed: 08/11/2019 Elsevier Patient Education  2023 [employer].

## 2024-01-29 ENCOUNTER — Telehealth: Payer: Self-pay | Admitting: Neurosurgery

## 2024-01-29 ENCOUNTER — Other Ambulatory Visit: Payer: Self-pay

## 2024-01-29 MED ORDER — ONDANSETRON 4 MG PO TBDP
4.0000 mg | ORAL_TABLET | Freq: Three times a day (TID) | ORAL | 1 refills | Status: AC | PRN
  Filled 2024-01-29: qty 20, 7d supply, fill #0

## 2024-01-29 NOTE — Telephone Encounter (Signed)
 I spoke with the patient and reviewed Bryan's message about her +MRSA results, the mupirocin  ointment, and the IV antibiotics that she will receive in the OR. She verbalized understanding of his message. She states she will pick up the ointment tomorrow.

## 2024-01-29 NOTE — Telephone Encounter (Addendum)
 [name], NP sent her a mychart message about this yesterday. I will call her to review.

## 2024-01-29 NOTE — Telephone Encounter (Signed)
 Patient is calling let our office know that her nasal swab showed MRSA and Staph and she would like to know if she needs to do anything prior to surgery or if she will be placed on antibiotics following surgery on 02/06/2024. Please advise.

## 2024-01-29 NOTE — Telephone Encounter (Signed)
 I spoke with her and she is good to go now. =)

## 2024-01-30 ENCOUNTER — Other Ambulatory Visit: Payer: Self-pay

## 2024-02-05 ENCOUNTER — Other Ambulatory Visit (HOSPITAL_COMMUNITY): Payer: Self-pay

## 2024-02-06 ENCOUNTER — Other Ambulatory Visit: Payer: Self-pay

## 2024-02-06 ENCOUNTER — Encounter
Admission: RE | Disposition: A | Discharge status: Home / Self Care | Payer: Self-pay | Source: Home / Self Care | Attending: Neurosurgery

## 2024-02-06 ENCOUNTER — Encounter: Payer: Self-pay | Admitting: Neurosurgery

## 2024-02-06 ENCOUNTER — Observation Stay
Admission: RE | Admit: 2024-02-06 | Discharge: 2024-02-07 | Disposition: A | Discharge status: Home / Self Care | Attending: Neurosurgery | Admitting: Neurosurgery

## 2024-02-06 ENCOUNTER — Ambulatory Visit: Payer: Self-pay | Admitting: Urgent Care

## 2024-02-06 ENCOUNTER — Ambulatory Visit

## 2024-02-06 DIAGNOSIS — M4322 Fusion of spine, cervical region: Secondary | ICD-10-CM | POA: Insufficient documentation

## 2024-02-06 DIAGNOSIS — I10 Essential (primary) hypertension: Secondary | ICD-10-CM | POA: Diagnosis not present

## 2024-02-06 DIAGNOSIS — Z79899 Other long term (current) drug therapy: Secondary | ICD-10-CM | POA: Diagnosis not present

## 2024-02-06 DIAGNOSIS — Z981 Arthrodesis status: Principal | ICD-10-CM

## 2024-02-06 DIAGNOSIS — Z87891 Personal history of nicotine dependence: Secondary | ICD-10-CM | POA: Diagnosis not present

## 2024-02-06 DIAGNOSIS — F1090 Alcohol use, unspecified, uncomplicated: Secondary | ICD-10-CM | POA: Diagnosis not present

## 2024-02-06 DIAGNOSIS — K219 Gastro-esophageal reflux disease without esophagitis: Secondary | ICD-10-CM | POA: Diagnosis not present

## 2024-02-06 DIAGNOSIS — Z01818 Encounter for other preprocedural examination: Secondary | ICD-10-CM

## 2024-02-06 DIAGNOSIS — M4802 Spinal stenosis, cervical region: Secondary | ICD-10-CM | POA: Insufficient documentation

## 2024-02-06 DIAGNOSIS — M5412 Radiculopathy, cervical region: Principal | ICD-10-CM | POA: Insufficient documentation

## 2024-02-06 HISTORY — PX: ANTERIOR CERVICAL DECOMP/DISCECTOMY FUSION: SHX1161

## 2024-02-06 SURGERY — ANTERIOR CERVICAL DECOMPRESSION/DISCECTOMY FUSION 1 LEVEL
Anesthesia: General | Site: Spine Cervical

## 2024-02-06 MED ORDER — SORBITOL 70 % SOLN: 30.0000 mL | Freq: Every day | Status: DC | PRN

## 2024-02-06 MED ORDER — PHENYLEPHRINE HCL-NACL 20-0.9 MG/250ML-% IV SOLN
INTRAVENOUS | Status: DC | PRN
  Administered 2024-02-06: 50 ug/min via INTRAVENOUS

## 2024-02-06 MED ORDER — METHOCARBAMOL 500 MG PO TABS
500.0000 mg | ORAL_TABLET | Freq: Four times a day (QID) | ORAL | Status: DC | PRN
  Administered 2024-02-07: 500 mg via ORAL
  Filled 2024-02-06: qty 1

## 2024-02-06 MED ORDER — REMIFENTANIL HCL 1 MG IV SOLR
INTRAVENOUS | Status: AC
  Filled 2024-02-06: qty 1000

## 2024-02-06 MED ORDER — LACTATED RINGERS IV SOLN: INTRAVENOUS | Status: DC

## 2024-02-06 MED ORDER — CEFAZOLIN SODIUM-DEXTROSE 2-4 GM/100ML-% IV SOLN
2.0000 g | Freq: Once | INTRAVENOUS | Status: AC
  Administered 2024-02-06: 2 g via INTRAVENOUS

## 2024-02-06 MED ORDER — CHLORHEXIDINE GLUCONATE 4 % EX SOLN
1.0000 | CUTANEOUS | 1 refills | Status: DC
Start: 2024-02-06 — End: 2024-03-20
  Filled 2024-02-06: qty 472, 15d supply, fill #0

## 2024-02-06 MED ORDER — PHENYLEPHRINE 80 MCG/ML (10ML) SYRINGE FOR IV PUSH (FOR BLOOD PRESSURE SUPPORT)
PREFILLED_SYRINGE | INTRAVENOUS | Status: AC
  Filled 2024-02-06: qty 10

## 2024-02-06 MED ORDER — FUROSEMIDE 20 MG PO TABS
40.0000 mg | ORAL_TABLET | Freq: Every day | ORAL | Status: DC
  Administered 2024-02-07: 40 mg via ORAL
  Filled 2024-02-06: qty 2

## 2024-02-06 MED ORDER — ONDANSETRON HCL 4 MG PO TABS: 4.0000 mg | ORAL_TABLET | Freq: Four times a day (QID) | ORAL | Status: DC | PRN

## 2024-02-06 MED ORDER — PROPOFOL 10 MG/ML IV BOLUS
INTRAVENOUS | Status: AC
  Filled 2024-02-06: qty 20

## 2024-02-06 MED ORDER — FENTANYL CITRATE (PF) 100 MCG/2ML IJ SOLN
25.0000 ug | INTRAMUSCULAR | Status: DC | PRN
  Administered 2024-02-06 (×2): 50 ug via INTRAVENOUS

## 2024-02-06 MED ORDER — ONDANSETRON HCL 4 MG/2ML IJ SOLN
INTRAMUSCULAR | Status: AC
  Filled 2024-02-06: qty 2

## 2024-02-06 MED ORDER — ORAL CARE MOUTH RINSE: 15.0000 mL | Freq: Once | OROMUCOSAL | Status: AC

## 2024-02-06 MED ORDER — OXYCODONE HCL 5 MG PO TABS: 5.0000 mg | ORAL_TABLET | Freq: Once | ORAL | Status: DC | PRN

## 2024-02-06 MED ORDER — POTASSIUM CHLORIDE CRYS ER 20 MEQ PO TBCR
20.0000 meq | EXTENDED_RELEASE_TABLET | Freq: Two times a day (BID) | ORAL | Status: DC
  Administered 2024-02-07: 20 meq via ORAL
  Filled 2024-02-06: qty 1

## 2024-02-06 MED ORDER — MAGNESIUM CITRATE PO SOLN: 1.0000 | Freq: Once | ORAL | Status: DC | PRN

## 2024-02-06 MED ORDER — ACETAMINOPHEN 10 MG/ML IV SOLN
INTRAVENOUS | Status: AC
Start: 2024-02-06 — End: 2024-02-06
  Filled 2024-02-06: qty 100

## 2024-02-06 MED ORDER — REMIFENTANIL HCL 1 MG IV SOLR
INTRAVENOUS | Status: DC | PRN
  Administered 2024-02-06: .15 ug/kg/min via INTRAVENOUS

## 2024-02-06 MED ORDER — ONDANSETRON HCL 4 MG/2ML IJ SOLN
INTRAMUSCULAR | Status: DC | PRN
  Administered 2024-02-06: 4 mg via INTRAVENOUS

## 2024-02-06 MED ORDER — DEXAMETHASONE SODIUM PHOSPHATE 10 MG/ML IJ SOLN
INTRAMUSCULAR | Status: AC
  Filled 2024-02-06: qty 1

## 2024-02-06 MED ORDER — SUCCINYLCHOLINE CHLORIDE 200 MG/10ML IV SOSY
PREFILLED_SYRINGE | INTRAVENOUS | Status: AC
  Filled 2024-02-06: qty 10

## 2024-02-06 MED ORDER — MIDAZOLAM HCL 2 MG/2ML IJ SOLN
INTRAMUSCULAR | Status: DC | PRN
  Administered 2024-02-06: 2 mg via INTRAVENOUS

## 2024-02-06 MED ORDER — BUPIVACAINE-EPINEPHRINE (PF) 0.5% -1:200000 IJ SOLN
INTRAMUSCULAR | Status: DC | PRN
  Administered 2024-02-06: 10 mL

## 2024-02-06 MED ORDER — ENOXAPARIN SODIUM 40 MG/0.4ML IJ SOSY
40.0000 mg | PREFILLED_SYRINGE | INTRAMUSCULAR | Status: DC
  Administered 2024-02-07: 40 mg via SUBCUTANEOUS
  Filled 2024-02-06: qty 0.4

## 2024-02-06 MED ORDER — FENTANYL CITRATE (PF) 100 MCG/2ML IJ SOLN
INTRAMUSCULAR | Status: AC
  Filled 2024-02-06: qty 2

## 2024-02-06 MED ORDER — SENNA 8.6 MG PO TABS
1.0000 | ORAL_TABLET | Freq: Two times a day (BID) | ORAL | Status: DC
  Administered 2024-02-07: 8.6 mg via ORAL
  Filled 2024-02-06: qty 1

## 2024-02-06 MED ORDER — HYDROXYZINE HCL 10 MG PO TABS: 10.0000 mg | ORAL_TABLET | Freq: Three times a day (TID) | ORAL | Status: DC | PRN

## 2024-02-06 MED ORDER — TRIAMTERENE-HCTZ 37.5-25 MG PO TABS
1.0000 | ORAL_TABLET | Freq: Every day | ORAL | Status: DC
  Administered 2024-02-07: 1 via ORAL
  Filled 2024-02-06 (×2): qty 1

## 2024-02-06 MED ORDER — CHLORHEXIDINE GLUCONATE 0.12 % MT SOLN
15.0000 mL | Freq: Once | OROMUCOSAL | Status: AC
  Administered 2024-02-06: 15 mL via OROMUCOSAL

## 2024-02-06 MED ORDER — HYDROMORPHONE HCL 1 MG/ML IJ SOLN: 0.5000 mg | INTRAMUSCULAR | Status: AC | PRN

## 2024-02-06 MED ORDER — METHOCARBAMOL 1000 MG/10ML IJ SOLN
500.0000 mg | Freq: Four times a day (QID) | INTRAMUSCULAR | Status: DC | PRN
  Administered 2024-02-06: 500 mg via INTRAVENOUS

## 2024-02-06 MED ORDER — ACETAMINOPHEN 10 MG/ML IV SOLN
INTRAVENOUS | Status: DC | PRN
Start: 2024-02-06 — End: 2024-02-06
  Administered 2024-02-06: 1000 mg via INTRAVENOUS

## 2024-02-06 MED ORDER — LIDOCAINE HCL (PF) 2 % IJ SOLN
INTRAMUSCULAR | Status: AC
  Filled 2024-02-06: qty 5

## 2024-02-06 MED ORDER — CHLORHEXIDINE GLUCONATE 0.12 % MT SOLN
OROMUCOSAL | Status: AC
  Filled 2024-02-06: qty 15

## 2024-02-06 MED ORDER — SUCCINYLCHOLINE CHLORIDE 200 MG/10ML IV SOSY
PREFILLED_SYRINGE | INTRAVENOUS | Status: DC | PRN
Start: 2024-02-06 — End: 2024-02-06
  Administered 2024-02-06: 160 mg via INTRAVENOUS

## 2024-02-06 MED ORDER — ONDANSETRON HCL 4 MG/2ML IJ SOLN: 4.0000 mg | Freq: Four times a day (QID) | INTRAMUSCULAR | Status: DC | PRN

## 2024-02-06 MED ORDER — OXYCODONE HCL 5 MG PO TABS
ORAL_TABLET | ORAL | Status: AC
  Filled 2024-02-06: qty 2

## 2024-02-06 MED ORDER — SODIUM CHLORIDE FLUSH 0.9 % IV SOLN
INTRAVENOUS | Status: AC
  Filled 2024-02-06: qty 10

## 2024-02-06 MED ORDER — MIDAZOLAM HCL 2 MG/2ML IJ SOLN
INTRAMUSCULAR | Status: AC
  Filled 2024-02-06: qty 2

## 2024-02-06 MED ORDER — HYDROMORPHONE HCL 1 MG/ML IJ SOLN
INTRAMUSCULAR | Status: AC
  Filled 2024-02-06: qty 1

## 2024-02-06 MED ORDER — SODIUM CHLORIDE 0.9 % IV SOLN: 250.0000 mL | INTRAVENOUS | Status: DC

## 2024-02-06 MED ORDER — LINACLOTIDE 145 MCG PO CAPS
290.0000 ug | ORAL_CAPSULE | Freq: Every day | ORAL | Status: DC
  Administered 2024-02-07: 290 ug via ORAL
  Filled 2024-02-06: qty 2
  Filled 2024-02-06: qty 1

## 2024-02-06 MED ORDER — VANCOMYCIN HCL 1000 MG/200ML IV SOLN
1000.0000 mg | Freq: Once | INTRAVENOUS | Status: AC
  Administered 2024-02-06: 1000 mg via INTRAVENOUS
  Filled 2024-02-06 (×2): qty 200

## 2024-02-06 MED ORDER — DIAZEPAM 2 MG PO TABS
2.0000 mg | ORAL_TABLET | Freq: Four times a day (QID) | ORAL | Status: DC | PRN
  Administered 2024-02-06 – 2024-02-07 (×2): 2 mg via ORAL
  Filled 2024-02-06 (×2): qty 1

## 2024-02-06 MED ORDER — MUPIROCIN 2 % EX OINT
1.0000 | TOPICAL_OINTMENT | Freq: Two times a day (BID) | CUTANEOUS | 0 refills | Status: AC
  Filled 2024-02-06: qty 44, 22d supply, fill #0

## 2024-02-06 MED ORDER — PHENYLEPHRINE 80 MCG/ML (10ML) SYRINGE FOR IV PUSH (FOR BLOOD PRESSURE SUPPORT)
PREFILLED_SYRINGE | INTRAVENOUS | Status: DC | PRN
  Administered 2024-02-06: 160 ug via INTRAVENOUS
  Administered 2024-02-06: 80 ug via INTRAVENOUS
  Administered 2024-02-06 (×2): 160 ug via INTRAVENOUS

## 2024-02-06 MED ORDER — PANTOPRAZOLE SODIUM 40 MG PO TBEC
40.0000 mg | DELAYED_RELEASE_TABLET | Freq: Every day | ORAL | Status: DC
  Administered 2024-02-07: 40 mg via ORAL
  Filled 2024-02-06: qty 1

## 2024-02-06 MED ORDER — SODIUM CHLORIDE 0.9% FLUSH: 3.0000 mL | INTRAVENOUS | Status: DC | PRN

## 2024-02-06 MED ORDER — POLYETHYLENE GLYCOL 3350 17 G PO PACK: 17.0000 g | PACK | Freq: Every day | ORAL | Status: DC | PRN

## 2024-02-06 MED ORDER — MENTHOL 3 MG MT LOZG: 1.0000 | LOZENGE | OROMUCOSAL | Status: DC | PRN

## 2024-02-06 MED ORDER — SURGIFLO WITH THROMBIN (HEMOSTATIC MATRIX KIT) OPTIME
TOPICAL | Status: DC | PRN
  Administered 2024-02-06: 1 via TOPICAL

## 2024-02-06 MED ORDER — BUPIVACAINE-EPINEPHRINE (PF) 0.5% -1:200000 IJ SOLN
INTRAMUSCULAR | Status: AC
Start: 2024-02-06 — End: 2024-02-06
  Filled 2024-02-06: qty 10

## 2024-02-06 MED ORDER — LIDOCAINE HCL (CARDIAC) PF 100 MG/5ML IV SOSY
PREFILLED_SYRINGE | INTRAVENOUS | Status: DC | PRN
  Administered 2024-02-06: 60 mg via INTRAVENOUS

## 2024-02-06 MED ORDER — EPHEDRINE SULFATE-NACL 50-0.9 MG/10ML-% IV SOSY
PREFILLED_SYRINGE | INTRAVENOUS | Status: DC | PRN
Start: 2024-02-06 — End: 2024-02-06
  Administered 2024-02-06 (×3): 5 mg via INTRAVENOUS
  Administered 2024-02-06: 10 mg via INTRAVENOUS

## 2024-02-06 MED ORDER — ACETAMINOPHEN 500 MG PO TABS
1000.0000 mg | ORAL_TABLET | Freq: Four times a day (QID) | ORAL | Status: DC
  Administered 2024-02-06 – 2024-02-07 (×3): 1000 mg via ORAL
  Filled 2024-02-06 (×4): qty 2

## 2024-02-06 MED ORDER — 0.9 % SODIUM CHLORIDE (POUR BTL) OPTIME
TOPICAL | Status: DC | PRN
Start: 2024-02-06 — End: 2024-02-06
  Administered 2024-02-06: 500 mL

## 2024-02-06 MED ORDER — OXYCODONE HCL 5 MG PO TABS
10.0000 mg | ORAL_TABLET | ORAL | Status: DC | PRN
  Administered 2024-02-06 – 2024-02-07 (×3): 10 mg via ORAL
  Filled 2024-02-06 (×4): qty 2

## 2024-02-06 MED ORDER — PHENOL 1.4 % MT LIQD: 1.0000 | OROMUCOSAL | Status: DC | PRN

## 2024-02-06 MED ORDER — CEFAZOLIN SODIUM-DEXTROSE 2-4 GM/100ML-% IV SOLN
INTRAVENOUS | Status: AC
  Filled 2024-02-06: qty 100

## 2024-02-06 MED ORDER — PROPOFOL 10 MG/ML IV BOLUS
INTRAVENOUS | Status: DC | PRN
  Administered 2024-02-06: 160 mg via INTRAVENOUS

## 2024-02-06 MED ORDER — DEXAMETHASONE SODIUM PHOSPHATE 10 MG/ML IJ SOLN
INTRAMUSCULAR | Status: DC | PRN
Start: 2024-02-06 — End: 2024-02-06
  Administered 2024-02-06: 10 mg via INTRAVENOUS

## 2024-02-06 MED ORDER — HYDROMORPHONE HCL 1 MG/ML IJ SOLN
1.0000 mg | INTRAMUSCULAR | Status: DC | PRN
  Administered 2024-02-06: 1 mg via INTRAVENOUS

## 2024-02-06 MED ORDER — FENTANYL CITRATE (PF) 100 MCG/2ML IJ SOLN
INTRAMUSCULAR | Status: DC | PRN
  Administered 2024-02-06 (×2): 50 ug via INTRAVENOUS

## 2024-02-06 MED ORDER — METHOCARBAMOL 1000 MG/10ML IJ SOLN
INTRAMUSCULAR | Status: AC
  Filled 2024-02-06: qty 10

## 2024-02-06 MED ORDER — ACETAMINOPHEN 325 MG PO TABS: 650.0000 mg | ORAL_TABLET | ORAL | Status: DC | PRN

## 2024-02-06 MED ORDER — SUMATRIPTAN SUCCINATE 50 MG PO TABS: 50.0000 mg | ORAL_TABLET | ORAL | Status: DC

## 2024-02-06 MED ORDER — AMLODIPINE BESYLATE 5 MG PO TABS
10.0000 mg | ORAL_TABLET | Freq: Every day | ORAL | Status: DC
Start: 2024-02-07 — End: 2024-02-07
  Administered 2024-02-07: 10 mg via ORAL

## 2024-02-06 MED ORDER — OXYCODONE HCL 5 MG PO TABS: 5.0000 mg | ORAL_TABLET | ORAL | Status: DC | PRN

## 2024-02-06 MED ORDER — DOCUSATE SODIUM 100 MG PO CAPS
100.0000 mg | ORAL_CAPSULE | Freq: Two times a day (BID) | ORAL | Status: DC
  Administered 2024-02-07: 100 mg via ORAL
  Filled 2024-02-06: qty 1

## 2024-02-06 MED ORDER — OXYCODONE HCL 5 MG/5ML PO SOLN: 5.0000 mg | Freq: Once | ORAL | Status: DC | PRN

## 2024-02-06 MED ORDER — PHENYLEPHRINE HCL-NACL 20-0.9 MG/250ML-% IV SOLN
INTRAVENOUS | Status: AC
  Filled 2024-02-06: qty 500

## 2024-02-06 MED ORDER — ACETAMINOPHEN 650 MG RE SUPP: 650.0000 mg | RECTAL | Status: DC | PRN

## 2024-02-06 MED ORDER — SODIUM CHLORIDE 0.9% FLUSH
3.0000 mL | Freq: Two times a day (BID) | INTRAVENOUS | Status: DC
  Administered 2024-02-06 – 2024-02-07 (×2): 3 mL via INTRAVENOUS

## 2024-02-06 SURGICAL SUPPLY — 36 items
BASIN KIT SINGLE STR (MISCELLANEOUS) ×1 IMPLANT
BUR NEURO DRILL SOFT 3.0X3.8M (BURR) ×1 IMPLANT
DERMABOND ADVANCED .7 DNX12 (GAUZE/BANDAGES/DRESSINGS) ×1 IMPLANT
DRAIN CHANNEL JP 10F RND 20C F (MISCELLANEOUS) IMPLANT
DRAPE C ARM PK CFD 31 SPINE (DRAPES) ×1 IMPLANT
DRAPE LAPAROTOMY 77X122 PED (DRAPES) ×1 IMPLANT
DRAPE MICROSCOPE SPINE 48X150 (DRAPES) ×1 IMPLANT
DRSG TEGADERM 4X4.75 (GAUZE/BANDAGES/DRESSINGS) IMPLANT
ELECTRODE REM PT RTRN 9FT ADLT (ELECTROSURGICAL) ×1 IMPLANT
EVACUATOR SILICONE 100CC (DRAIN) IMPLANT
FEE INTRAOP CADWELL SUPPLY NCS (MISCELLANEOUS) IMPLANT
FEE INTRAOP MONITOR IMPULS NCS (MISCELLANEOUS) IMPLANT
GAUZE SPONGE 2X2 STRL 8-PLY (GAUZE/BANDAGES/DRESSINGS) IMPLANT
GLOVE BIOGEL PI IND STRL 6.5 (GLOVE) ×1 IMPLANT
GLOVE SURG SYN 6.5 PF PI (GLOVE) ×1 IMPLANT
GLOVE SURG SYN 8.5 PF PI (GLOVE) ×3 IMPLANT
GOWN SRG LRG LVL 4 IMPRV REINF (GOWNS) ×1 IMPLANT
GOWN SRG XL LVL 3 NONREINFORCE (GOWNS) ×1 IMPLANT
KIT TURNOVER KIT A (KITS) ×1 IMPLANT
MANIFOLD NEPTUNE II (INSTRUMENTS) ×1 IMPLANT
NS IRRIG 500ML POUR BTL (IV SOLUTION) ×1 IMPLANT
PACK LAMINECTOMY ARMC (PACKS) ×1 IMPLANT
PAD ARMBOARD POSITIONER FOAM (MISCELLANEOUS) ×2 IMPLANT
PIN CASPAR 14 (PIN) ×1 IMPLANT
PLATE ACP 1.6 18 (Plate) IMPLANT
SCREW ACP VA SD 3.5X15 (Screw) IMPLANT
SPACER CERVICAL FRGE 12X14X7-7 (Spacer) IMPLANT
SPONGE KITTNER 5P (MISCELLANEOUS) ×1 IMPLANT
STAPLER SKIN PROX 35W (STAPLE) IMPLANT
SURGIFLO W/THROMBIN 8M KIT (HEMOSTASIS) ×1 IMPLANT
SUT STRATA 3-0 15 PS-2 (SUTURE) ×1 IMPLANT
SUT VIC AB 3-0 SH 8-18 (SUTURE) ×1 IMPLANT
SUTURE EHLN 3-0 FS-10 30 BLK (SUTURE) IMPLANT
SYR 20ML LL LF (SYRINGE) ×1 IMPLANT
TAPE CLOTH 3X10 WHT NS LF (GAUZE/BANDAGES/DRESSINGS) ×2 IMPLANT
TRAP FLUID SMOKE EVACUATOR (MISCELLANEOUS) ×1 IMPLANT

## 2024-02-06 NOTE — Plan of Care (Signed)
 Verbalizes understanding plan of care, discharge instructions

## 2024-02-06 NOTE — Anesthesia Procedure Notes (Cosign Needed)
 Procedure Name: Intubation Date/Time: 02/06/2024 1:17 PM  Performed by: Stevan Fairy POUR, MDPre-anesthesia Checklist: Patient identified, Emergency Drugs available, Suction available and Patient being monitored Patient Re-evaluated:Patient Re-evaluated prior to induction Oxygen Delivery Method: Circle system utilized Preoxygenation: Pre-oxygenation with 100% oxygen Induction Type: IV induction Ventilation: Mask ventilation without difficulty Laryngoscope Size: McGrath and 3 Grade View: Grade I Tube type: Oral Tube size: 7.0 mm Number of attempts: 1 Airway Equipment and Method: Stylet Placement Confirmation: ETT inserted through vocal cords under direct vision, positive ETCO2 and breath sounds checked- equal and bilateral Secured at: 21 cm Tube secured with: Tape Dental Injury: Teeth and Oropharynx as per pre-operative assessment

## 2024-02-06 NOTE — Discharge Instructions (Signed)

## 2024-02-06 NOTE — H&P (Signed)
 Referring Physician:  No referring provider defined for this encounter.  Primary Physician:  Myrla Jon HERO, MD  History of Present Illness: 02/06/2024 Elizabeth Bowers presents today for surgery.  12/27/2023 She has tried physical therapy since I last saw her.  She has had continued right arm pain.  11/01/2023 Elizabeth Bowers is here today with a chief complaint of chronic neck Elizabeth arm pain who presents for evaluation of worsening symptoms. She was referred by Dr. Naveira for evaluation of neck problems potentially contributing to headaches.  Neck pain radiates down the right arm, affecting the entire hand, including all five fingers, for approximately two months since March 2025. The pain is accompanied by weakness in the arm, leading to dropping objects, Elizabeth pain in the right shoulder blade. Ice packs are used for pain management, which worsens with movement.  A history of falls includes one in December 2024 while doing laundry, injuring her shoulder blade Elizabeth neck, Elizabeth another in January 2025 where she slipped on a dog's bed, injuring her left arm. Symptoms worsened in March 2025, coinciding with the onset of severe migraines.  Diagnostic studies include a CT scan of the shoulder Elizabeth an MRI of the cervical spine. Previous treatments, including injections Elizabeth pain medications like Toradol  Elizabeth migraine cocktails, have been ineffective. Severe pain sometimes leads to vomiting Elizabeth requires her to remain still, unable to lie on her right side due to pain.  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 She wants to return to work.  Neck pain that radiates to the right shoulder Elizabeth headaches.  She has difficulty with range of motion of her right arm.  Bowel/Bladder Dysfunction: none  Conservative measures:  Physical therapy: has not participated in PT  Multimodal medical therapy  including regular antiinflammatories: Oxycodone , Toradol   Injections: 10/30/2023 Right C7-T1 ESI 09/26/2023 Bilateral C3, C4 MBB 01/11/2023 Right C7-T1 ESI 05/11/2022 Right C7-T1 ESI 09/08/2021 Right L3-4 ESI   Past Surgery: no   Elizabeth Bowers has no symptoms of cervical myelopathy.  The symptoms are causing a significant impact on the patient's life.   I have utilized the care everywhere function in epic to review the outside records available from external health systems.  Review of Systems:  A 10 point review of systems is negative, except for the pertinent positives Elizabeth negatives detailed in the HPI.  Past Medical History: Past Medical History:  Diagnosis Date   Anxiety    Cervical radiculopathy    Cervicalgia    Cervicogenic headache    Chronic pain syndrome    Chronic upper extremity pain    DDD (degenerative disc disease), cervical    Degenerative tear of glenoid labrum, unspecified laterality    Fatty liver    Fibromyalgia    Foraminal stenosis of cervical region    GERD (gastroesophageal reflux disease)    Hemorrhoids    History of depression    Hot flashes    Hypertension    IBS (irritable bowel syndrome)    LUQ abdominal pain 10/25/2016   Migraines    Miscarriage    x 2; 4 live births    Multilevel degenerative disc disease    Neuroma    Nose colonized with MRSA 01/28/2024   a.) presurgical PCR (+) 01/28/2024 prior to ACDF C5-C6   Pedal edema    Shortness of breath    Vocal cord dysfunction  Past Surgical History: Past Surgical History:  Procedure Laterality Date   ABDOMINAL HYSTERECTOMY     2012 removed cervix h/o abnormal pap    CHOLECYSTECTOMY     2000   COLONOSCOPY     2018 with + polpys Elizabeth GIB 2/2 polyp removal    COLONOSCOPY WITH PROPOFOL  N/A 12/22/2019   Procedure: COLONOSCOPY WITH PROPOFOL ;  Surgeon: Unk Corinn Skiff, MD;  Location: [facility] ENDOSCOPY;  Service: Gastroenterology;  Laterality: N/A;   COLONOSCOPY WITH PROPOFOL  N/A  12/23/2019   Procedure: COLONOSCOPY WITH PROPOFOL ;  Surgeon: Unk Corinn Skiff, MD;  Location: [facility] ENDOSCOPY;  Service: Gastroenterology;  Laterality: N/A;  Last name pronounced LEE-MA   COLONOSCOPY WITH PROPOFOL  N/A 01/01/2023   Procedure: COLONOSCOPY WITH PROPOFOL ;  Surgeon: Unk Corinn Skiff, MD;  Location: [facility] ENDOSCOPY;  Service: Gastroenterology;  Laterality: N/A;   ELBOW DEBRIDEMENT     ESOPHAGOGASTRODUODENOSCOPY (EGD) WITH PROPOFOL  N/A 02/06/2018   Procedure: ESOPHAGOGASTRODUODENOSCOPY (EGD) WITH PROPOFOL  with biopsies;  Surgeon: Unk Corinn Skiff, MD;  Location: [facility];  Service: Endoscopy;  Laterality: N/A;   ETHMOIDECTOMY N/A 09/28/2021   Procedure: TOTAL ETHMOIDECTOMY;  Surgeon: Milissa Hamming, MD;  Location: [facility];  Service: ENT;  Laterality: N/A;   FOOT SURGERY     IMAGE GUIDED SINUS SURGERY N/A 09/28/2021   Procedure: IMAGE GUIDED SINUS SURGERY;  Surgeon: Milissa Hamming, MD;  Location: [facility];  Service: ENT;  Laterality: N/A;  PLACED DISK ON OR CHARGE NURSE DESK 4-21  KP   MAXILLARY ANTROSTOMY Bilateral 09/28/2021   Procedure: MAXILLARY ANTROSTOMY WITHOUT TISSUE REMOVAL;  Surgeon: Milissa Hamming, MD;  Location: [facility];  Service: ENT;  Laterality: Bilateral;   POLYPECTOMY  01/01/2023   Procedure: POLYPECTOMY;  Surgeon: Unk Corinn Skiff, MD;  Location: [facility] ENDOSCOPY;  Service: Gastroenterology;;   CLEONE Bilateral 09/28/2021   Procedure: SPHENOIDOTOMY;  Surgeon: Milissa Hamming, MD;  Location: [facility]ERY [facility];  Service: ENT;  Laterality: Bilateral;    Allergies: Allergies as of 01/18/2024 - Review Complete 01/17/2024  Allergen Reaction Noted   Gluten meal  04/22/2019   Lactose  04/22/2019   Ranitidine Diarrhea Elizabeth Other (See Comments) 08/30/2016   Sulfa antibiotics Anxiety 12/15/2016   Amphetamine-dextroamphetamine Other (See Comments) 05/10/2016   Lactose intolerance (gi)  02/05/2018   Reglan  [metoclopramide] Other (See Comments) 12/15/2016   Topiramate  er Other (See Comments) 01/16/2018   Wheat  02/05/2018   Soap Rash 02/20/2017    Medications:  Current Facility-Administered Medications:    ceFAZolin  (ANCEF ) IVPB 2g/100 mL premix, 2 g, Intravenous, Once, Bowers Fret, MD   chlorhexidine  (PERIDEX ) 0.12 % solution 15 mL, 15 mL, Mouth/Throat, Once **OR** Oral care mouth rinse, 15 mL, Mouth Rinse, Once, Elizabeth Lendia CROME, MD   lactated ringers  infusion, , Intravenous, Continuous, Elizabeth Lendia CROME, MD   vancomycin  (VANCOREADY) IVPB 1000 mg/200 mL, 1,000 mg, Intravenous, Once, Elizabeth Dorise BRAVO, NP  Social History: Social History   Tobacco Use   Smoking status: Former    Current packs/day: 0.00    Average packs/day: 0.8 packs/day for 5.0 years (3.8 ttl pk-yrs)    Types: Cigarettes    Start date: 07/05/1982    Quit date: 07/06/1987    Years since quitting: 36.6    Passive exposure: Never   Smokeless tobacco: Never  Vaping Use   Vaping status: Never Used  Substance Use Topics   Alcohol use: Yes    Alcohol/week: 1.0 - 2.0 standard drink of alcohol    Types: 1 - 2  Standard drinks or equivalent per week    Comment: 1-2 times a week   Drug use: Never    Family Medical History: Family History  Problem Relation Age of Onset   Hypertension Mother    Hyperlipidemia Mother    Glaucoma Mother    Diabetes Mother        type 2    COPD Father        emphysema   Alcohol abuse Father    Colon cancer Sister    Cancer Sister        rectal/colon cancer no h/o IBD   Colon polyps Sister    Asthma Sister    COPD Sister    Kidney disease Sister    Mental illness Sister        bipolar    Miscarriages / [place] Sister    Uterine cancer Sister    Alcohol abuse Son    Depression Son    Diabetes Son    Drug abuse Son    Mental illness Son        bipolar    Diabetes Maternal Grandmother    COPD Paternal Grandmother    Cancer Paternal Grandfather        FH colon cancer  maternal aunts/uncles    Miscarriages / Stillbirths Sister    Arthritis Son    Arthritis Son    Breast cancer Neg Hx     Physical Examination: There were no vitals filed for this visit.  Heart sounds normal no MRG. Chest Clear to Auscultation Bilaterally.    General: Patient is tearful. Attention to examination is appropriate.  Neck:   Supple.  Full range of motion.  Respiratory: Patient is breathing without any difficulty.   NEUROLOGICAL:     Awake, alert, oriented to person, place, Elizabeth time.  Speech is clear Elizabeth fluent.   Cranial Nerves: Pupils equal round Elizabeth reactive to light.  Facial tone is symmetric.  Facial sensation is symmetric. Shoulder shrug is symmetric. Tongue protrusion is midline.  There is no pronator drift.  Strength: Side Biceps Triceps Deltoid Interossei Grip Wrist Ext. Wrist Flex.  R 5 5 5 5 5 5 5   L 5 5 5 5 5 5 5    Side Iliopsoas Quads Hamstring PF DF EHL  R 5 5 5 5 5 5   L 5 5 5 5 5 5    Reflexes are 1+ Elizabeth symmetric at the biceps, triceps, brachioradialis, patella Elizabeth achilles.   Hoffman's is absent.   Bilateral upper Elizabeth lower extremity sensation is intact to light touch except right C6 distribution..    No evidence of dysmetria noted.  Gait is normal.      Medical Decision Making  Imaging: MRI 10/17/2023 Disc levels:   C2-3: Facet spurring asymmetric to the right where there is ankylosis. No neural impingement   C3-4: Disc narrowing Elizabeth bulging with uncovertebral ridging. Mild degenerative facet spurring. Moderate right foraminal narrowing which is progressed.   C4-5: Mild disc height loss with ventral endplate spurring. Degenerative facet spurring greater on the left. Patent canal Elizabeth foramina   C5-6: Disc narrowing Elizabeth bulging with endplate Elizabeth uncovertebral ridging. There is a central disc protrusion. Bilateral foraminal impingement.   C6-7: Disc narrowing Elizabeth bulging with endplate ridging. Mild right Elizabeth moderate left  foraminal narrowing.   C7-T1:Unremarkable.   IMPRESSION: Diffuse cervical spine degeneration with generalized progression since 2020.   Foraminal impingement bilaterally at C5-6 Elizabeth on the left at C6-7.   Diffusely patent  spinal canal.     Electronically Signed   By: Dorn Roulette M.D.   On: 10/24/2023 09:42    I have personally reviewed the images Elizabeth agree with the above interpretation.  Assessment Elizabeth Plan: Ms. Gouger is a pleasant 58 y.o. female with multiple issues.  Her migraine headache is causing her severe difficulty.    She has an ongoing right C6 radiculopathy.  She has tried physical therapy without improvement.  At this point, no further conservative management is indicated.  Have recommended surgical intervention with C5-6 anterior cervical discectomy Elizabeth fusion.     Amen Staszak K. Clois MD, [facility] Neurosurgery

## 2024-02-06 NOTE — Transfer of Care (Cosign Needed)
 Immediate Anesthesia Transfer of Care Note  Patient: Elizabeth Bowers  Procedure(s) Performed: ANTERIOR CERVICAL DECOMPRESSION/DISCECTOMY FUSION 1 LEVEL (Spine Cervical)  Patient Location: PACU  Anesthesia Type:General  Level of Consciousness: awake, oriented, and patient cooperative  Airway & Oxygen Therapy: Patient Spontanous Breathing and Patient connected to face mask oxygen  Post-op Assessment: Report given to RN and Post -op Vital signs reviewed and stable  Post vital signs: Reviewed and stable  Last Vitals:  Vitals Value Taken Time  BP 147/78 02/06/24 14:45  Temp    Pulse 102 02/06/24 14:47  Resp 20 02/06/24 14:47  SpO2 100 % 02/06/24 14:47  Vitals shown include unfiled device data.  Last Pain:  Vitals:   02/06/24 1246  TempSrc: Temporal  PainSc: 6          Complications: No notable events documented.

## 2024-02-06 NOTE — Anesthesia Preprocedure Evaluation (Signed)
 Anesthesia Evaluation  Patient identified by MRN, date of birth, ID band Patient awake    Reviewed: Allergy  & Precautions, NPO status , Patient's Chart, lab work & pertinent test results  History of Anesthesia Complications Negative for: history of anesthetic complications  Airway Mallampati: III  TM Distance: <3 FB Neck ROM: full    Dental  (+) Chipped   Pulmonary neg shortness of breath, former smoker   Pulmonary exam normal        Cardiovascular Exercise Tolerance: Good hypertension, (-) angina (-) Past MI Normal cardiovascular exam     Neuro/Psych  Headaches  Neuromuscular disease    GI/Hepatic Neg liver ROS,GERD  Controlled,,  Endo/Other  negative endocrine ROS    Renal/GU Renal disease     Musculoskeletal   Abdominal   Peds  Hematology negative hematology ROS (+)   Anesthesia Other Findings Patient reports transient vocal cord dysfunction   Past Medical History: No date: Anxiety No date: Cervical radiculopathy No date: Cervicalgia No date: Cervicogenic headache No date: Chronic pain syndrome No date: Chronic upper extremity pain No date: DDD (degenerative disc disease), cervical No date: Degenerative tear of glenoid labrum, unspecified laterality No date: Fatty liver No date: Fibromyalgia No date: Foraminal stenosis of cervical region No date: GERD (gastroesophageal reflux disease) No date: Hemorrhoids No date: History of depression No date: Hot flashes No date: Hypertension No date: IBS (irritable bowel syndrome) 10/25/2016: LUQ abdominal pain No date: Migraines No date: Miscarriage     Comment:  x 2; 4 live births  No date: Multilevel degenerative disc disease No date: Neuroma 01/28/2024: Nose colonized with MRSA     Comment:  a.) presurgical PCR (+) 01/28/2024 prior to ACDF C5-C6 No date: Pedal edema No date: Shortness of breath No date: Vocal cord dysfunction  Past Surgical  History: No date: ABDOMINAL HYSTERECTOMY     Comment:  2012 removed cervix h/o abnormal pap  No date: CHOLECYSTECTOMY     Comment:  2000 No date: COLONOSCOPY     Comment:  2018 with + polpys and GIB 2/2 polyp removal  12/22/2019: COLONOSCOPY WITH PROPOFOL ; N/A     Comment:  Procedure: COLONOSCOPY WITH PROPOFOL ;  Surgeon: Unk Corinn Skiff, MD;  Location: [facility] ENDOSCOPY;  Service:               Gastroenterology;  Laterality: N/A; 12/23/2019: COLONOSCOPY WITH PROPOFOL ; N/A     Comment:  Procedure: COLONOSCOPY WITH PROPOFOL ;  Surgeon: Unk Corinn Skiff, MD;  Location: [facility] ENDOSCOPY;  Service:               Gastroenterology;  Laterality: N/A;  Last name pronounced              LEE-MA 01/01/2023: COLONOSCOPY WITH PROPOFOL ; N/A     Comment:  Procedure: COLONOSCOPY WITH PROPOFOL ;  Surgeon: Unk Corinn Skiff, MD;  Location: [facility] ENDOSCOPY;  Service:               Gastroenterology;  Laterality: N/A; No date: ELBOW DEBRIDEMENT 02/06/2018: ESOPHAGOGASTRODUODENOSCOPY (EGD) WITH PROPOFOL ; N/A     Comment:  Procedure: ESOPHAGOGASTRODUODENOSCOPY (EGD) WITH               PROPOFOL  with biopsies;  Surgeon: Unk Corinn Skiff,  MD;  Location: [facility];  Service: Endoscopy;               Laterality: N/A; 09/28/2021: ETHMOIDECTOMY; N/A     Comment:  Procedure: TOTAL ETHMOIDECTOMY;  Surgeon: Milissa Hamming, MD;  Location: [facility];  Service:               ENT;  Laterality: N/A; No date: FOOT SURGERY 09/28/2021: IMAGE GUIDED SINUS SURGERY; N/A     Comment:  Procedure: IMAGE GUIDED SINUS SURGERY;  Surgeon: Milissa Hamming, MD;  Location: [facility];  Service:               ENT;  Laterality: N/A;  PLACED DISK ON OR CHARGE NURSE               DESK 4-21  KP 09/28/2021: MAXILLARY ANTROSTOMY; Bilateral     Comment:  Procedure: MAXILLARY ANTROSTOMY WITHOUT TISSUE REMOVAL;                Surgeon: Milissa Hamming, MD;  Location: [facility];  Service: ENT;  Laterality: Bilateral; 01/01/2023: POLYPECTOMY     Comment:  Procedure: POLYPECTOMY;  Surgeon: Unk Corinn Skiff,               MD;  Location: [facility] ENDOSCOPY;  Service:               Gastroenterology;; 09/28/2021: CLEONE; Bilateral     Comment:  Procedure: SPHENOIDOTOMY;  Surgeon: Milissa Hamming,               MD;  Location: [facility]ERY [facility];  Service: ENT;                Laterality: Bilateral;     Reproductive/Obstetrics negative OB ROS                              Anesthesia Physical Anesthesia Plan  ASA: 2  Anesthesia Plan: General ETT   Post-op Pain Management:    Induction: Intravenous  PONV Risk Score and Plan: Ondansetron , Dexamethasone , Midazolam  and Treatment may vary due to age or medical condition  Airway Management Planned: Oral ETT  Additional Equipment:   Intra-op Plan:   Post-operative Plan: Extubation in OR  Informed Consent: I have reviewed the patients History and Physical, chart, labs and discussed the procedure including the risks, benefits and alternatives for the proposed anesthesia with the patient or authorized representative who has indicated his/her understanding and acceptance.     Dental Advisory Given  Plan Discussed with: Anesthesiologist, CRNA and Surgeon  Anesthesia Plan Comments: (Patient consented for risks of anesthesia including but not limited to:  - adverse reactions to medications - damage to eyes, teeth, lips or other oral mucosa - nerve damage due to positioning  - sore throat or hoarseness - Damage to heart, brain, nerves, lungs, other parts of body or loss of life  Patient voiced understanding and assent.)        Anesthesia Quick Evaluation

## 2024-02-06 NOTE — Op Note (Signed)
 Indications: [name] is a 58 y.o. female with cervical radiculopathy.  Findings: stenosis, successful decompression  Preoperative Diagnosis: cervical radiculopathy Postoperative Diagnosis: same   EBL: 10 ml IVF: see anesthesia record Drains: none Disposition: Extubated and Stable to PACU Complications: none  No foley catheter was placed.   Preoperative Note:    Risks of surgery discussed include: infection, bleeding, stroke, coma, death, paralysis, CSF leak, nerve/spinal cord injury, numbness, tingling, weakness, complex regional pain syndrome, recurrent stenosis and/or disc herniation, vascular injury, development of instability, neck/back pain, need for further surgery, persistent symptoms, development of deformity, and the risks of anesthesia. The patient understood these risks and agreed to proceed.  Procedure:  1) Anterior cervical diskectomy and fusion at C5-6 2) Anterior cervical instrumentation at C5-6 3) Structural allograft consisting of corticocancellous allograft   Procedure: After obtaining informed consent, the patient taken to the operating room, placed in supine position, general anesthesia induced.  The patient had a small shoulder roll placed behind their shoulders.  The patient received preop antibiotics and IV Decadron .  A timeout was performed. The patient had a neck incision outlined, was prepped and draped in usual sterile fashion. The incision was injected with local anesthetic.   An incision was opened, dissection taken down medial to the carotid artery and jugular vein, lateral to the trachea and esophagus.  The prevertebral fascia was identified and a localizing x-ray demonstrated the correct level.  The longus colli were dissected laterally, and self-retaining retractors placed to open the operative field. The microscope was then brought into the field.  With this complete, distractor pins were placed in the vertebral bodies of C5 and C6. The distractor  was placed, and the annulus at C5/6 was opened using a bovie.  Curettes and pituitary rongeurs used to remove the majority of disk, then the drill was used to remove the posterior osteophyte, expose the posterior longitudinal ligament, and begin the foraminotomies. The nerve hook was used to elevate the posterior longitudinal ligament, which was then removed with Kerrison rongeurs to complete decompression of the spinal cord. The Kerrison rongeurs were then used to complete the foraminotomies bilaterally to decompress the nerve roots. The nerve hook could be passed out each foramen, ensuring decompression of the nerve roots.  Meticulous hemostasis obtained.  Structural allograft was tapped behind the anterior lip of the vertebral body at C5/6 (7 mm).    Please note that the procedure included removal of the disc, removal of the posterior osteophytes, and removal of the posterior longitudinal ligament to ensure decompression of the spinal cord.  Additionally, foraminotomies were performed on both sides of the spinal canal to decompress the nerve roots.  The caspar distractor was removed, and bone wax used for hemostasis. A separate, 18 mm Nuvasive ACP plate was chosen.  Two screws placed in each vertebral body, respectively making sure the screws were behind the locking mechanism.  Final AP and lateral radiographs were taken.   Please note that the plate is not inclusive to the interbody structural allograft.  The anchoring mechanism of the plate is completely separate from the allograft.  With everything in good position, the wound was irrigated copiously and meticulous hemostasis obtained.  Wound was closed in 2 layers using interrupted inverted 3-0 Vicryl sutures.  The wound was dressed with dermabond, the head of bed at 30 degrees, taken to recovery room in stable condition.  No new postop neurological deficits were identified.  Sponge and pattie counts were correct at the end of  the procedure.    I  performed the entire procedure with [employer]el [employer] PA as an Designer, television/film set. An assistant was required for this procedure due to the complexity.  The assistant provided assistance in tissue manipulation and suction, and was required for the successful and safe performance of the procedure. I performed the critical portions of the procedure.   Reeves Daisy MD

## 2024-02-07 ENCOUNTER — Other Ambulatory Visit: Payer: Self-pay

## 2024-02-07 ENCOUNTER — Encounter: Payer: Self-pay | Admitting: Neurosurgery

## 2024-02-07 DIAGNOSIS — I10 Essential (primary) hypertension: Secondary | ICD-10-CM | POA: Diagnosis not present

## 2024-02-07 DIAGNOSIS — F1090 Alcohol use, unspecified, uncomplicated: Secondary | ICD-10-CM | POA: Diagnosis not present

## 2024-02-07 DIAGNOSIS — M4322 Fusion of spine, cervical region: Secondary | ICD-10-CM | POA: Diagnosis not present

## 2024-02-07 DIAGNOSIS — Z79899 Other long term (current) drug therapy: Secondary | ICD-10-CM | POA: Diagnosis not present

## 2024-02-07 DIAGNOSIS — M5412 Radiculopathy, cervical region: Secondary | ICD-10-CM | POA: Diagnosis not present

## 2024-02-07 DIAGNOSIS — M4802 Spinal stenosis, cervical region: Secondary | ICD-10-CM | POA: Diagnosis not present

## 2024-02-07 DIAGNOSIS — K219 Gastro-esophageal reflux disease without esophagitis: Secondary | ICD-10-CM | POA: Diagnosis not present

## 2024-02-07 DIAGNOSIS — Z87891 Personal history of nicotine dependence: Secondary | ICD-10-CM | POA: Diagnosis not present

## 2024-02-07 MED ORDER — METHOCARBAMOL 500 MG PO TABS
500.0000 mg | ORAL_TABLET | Freq: Four times a day (QID) | ORAL | 0 refills | Status: DC | PRN
  Filled 2024-02-07: qty 120, 30d supply, fill #0

## 2024-02-07 MED ORDER — DIAZEPAM 2 MG PO TABS
2.0000 mg | ORAL_TABLET | Freq: Four times a day (QID) | ORAL | 0 refills | Status: AC | PRN
  Filled 2024-02-07: qty 10, 3d supply, fill #0

## 2024-02-07 MED ORDER — OXYCODONE HCL 5 MG PO TABS
5.0000 mg | ORAL_TABLET | ORAL | 0 refills | Status: AC | PRN
  Filled 2024-02-07: qty 45, 4d supply, fill #0

## 2024-02-07 MED ORDER — SENNA 8.6 MG PO TABS
1.0000 | ORAL_TABLET | Freq: Two times a day (BID) | ORAL | 0 refills | Status: DC | PRN
  Filled 2024-02-07: qty 30, 15d supply, fill #0

## 2024-02-07 MED ORDER — AMLODIPINE BESYLATE 5 MG PO TABS
ORAL_TABLET | ORAL | Status: AC
  Filled 2024-02-07: qty 2

## 2024-02-07 NOTE — Progress Notes (Signed)
 DISCHARGE NOTE:   Pt dc with IV removed and dc instructions given. Pt received medications delivered to hospital room. Pt given an ice pack as well. Pt voices no questions or concerns at this time. Pt wheeled down to medical mall entrance by staff and pt's husband provided transportation.

## 2024-02-07 NOTE — TOC Initial Note (Signed)
 Transition of Care [facility]) - Initial/Assessment Note    Patient Details  Name: Elizabeth Bowers MRN: [id number] Date of Birth: [birth year]  Transition of [facility]) CM/SW Contact:    Delphine KANDICE Bring, RN Phone Number: 02/07/2024, 10:01 AM  Clinical Narrative:                 Patient sitting up in chair eating breakfast/ Patient denies services in the home and use of DME. Patient states that she worked with therapy this morning. CM asked if she used RW during her therapy evaluation. Patient states no. Patient plan to go home at time of d/c. Her husband will transport. CM asked if she have HH for therapy patient states no.   Expected Discharge Plan: Home/Self Care Barriers to Discharge: No Barriers Identified   Patient Goals and CMS Choice            Expected Discharge Plan and Services       Living arrangements for the past 2 months: Single Family Home                                      Prior Living Arrangements/Services Living arrangements for the past 2 months: Single Family Home Lives with:: Adult Children, Spouse, Other (Comment) (5 y/o granddaughter) Patient language and need for interpreter reviewed:: No (English) Do you feel safe going back to the place where you live?: Yes               Activities of Daily Living   ADL Screening (condition at time of admission) Independently performs ADLs?: Yes (appropriate for developmental age) Is the patient deaf or have difficulty hearing?: No Does the patient have difficulty seeing, even when wearing glasses/contacts?: Yes Does the patient have difficulty concentrating, remembering, or making decisions?: Yes  Permission Sought/Granted                  Emotional Assessment Appearance:: Appears stated age, Well-Groomed   Affect (typically observed): Accepting, Calm Orientation: : Oriented to Self, Oriented to Place, Oriented to  Time, Oriented to Situation      Admission diagnosis:  Cervical  radiculopathy [M54.12] S/P cervical spinal fusion [Z98.1] Patient Active Problem List   Diagnosis Date Noted   Cervical radiculopathy 02/06/2024   S/P cervical spinal fusion 02/06/2024   Abnormal MRI, shoulder (Right) (11/17/2023) 12/19/2023   Rotator cuff tendinitis, right 11/19/2023   Cervical spondylosis with radiculopathy 11/19/2023   Chronic shoulder pain (Right) 09/19/2023   Radicular pain of shoulder (Right) 09/19/2023   Nausea with vomiting 09/19/2023   Pain in right shoulder 05/15/2023   Swelling of joint of right shoulder 05/15/2023   Effusion of acromioclavicular joint 05/15/2023   Localized swelling on right hand 05/15/2023   History of colonic polyps 01/01/2023   OAB (overactive bladder) 10/26/2022   Cervical radiculopathy at C6 (Right) 04/04/2022   Chronic hip pain (Right) 08/16/2021   Impaired range of motion of hip (Right) 08/16/2021   Chronic lower extremity pain (Right) 08/16/2021   Lower extremity radicular pain (Right) 08/16/2021   Lumbar radiculitis (L3/L4) (Right) 08/16/2021   Morton's neuroma of foot (Left) 11/15/2020   MDD (major depressive disorder) 10/01/2020   Anterior tibialis tendinitis, right 04/28/2020   Insomnia 07/07/2019   Benign essential hypertension 04/22/2019   Radial styloid tenosynovitis of left hand 01/13/2019   Pain in right hand 01/13/2019   Bilateral carpal tunnel syndrome  01/13/2019   Chronic pain of left wrist 01/13/2019   Pain in left wrist 01/13/2019   Radial styloid tenosynovitis (de quervain) 01/13/2019   Left wrist pain 01/13/2019   Chronic low back pain (Bilateral) w/o sciatica 08/08/2018   Abnormal MRI, cervical spine (10/24/2023) 07/17/2018   Cervical spondylitis with radiculitis (HCC) (C6) (Bilateral) (R>L) 06/26/2018   Chronic sacroiliac joint pain (Bilateral) (R>L) 06/26/2018   Cervical Grade 1 (2 mm) Retrolisthesis C5 over C6 03/20/2018   Cervical foraminal stenosis (C3-4 & C5-6) (Bilateral) 03/20/2018   DDD  (degenerative disc disease), cervical 03/20/2018   Stage 3 chronic kidney disease (HCC) 03/20/2018   Acid reflux disease 03/20/2018   Spondylosis without myelopathy or radiculopathy, cervical region 03/20/2018   Chronic hip pain (Bilateral) (L>R) 03/20/2018   Lumbar facet syndrome (Bilateral) (R>L) 03/20/2018   Spondylosis without myelopathy or radiculopathy, lumbar region 03/20/2018   Chronic Sacroiliac joint dysfunction (Bilateral) (R>L) 03/20/2018   Chronic Somatic dysfunction of sacroiliac joint (Bilateral) (R>L) 03/20/2018   Osteoarthritis of hip (Bilateral) 03/20/2018   Cervicogenic headache 03/20/2018   Cervicalgia (Bilateral) (R>L) 03/20/2018   Cervical facet syndrome (Bilateral) (R>L) 03/20/2018   Chronic occipital neuralgia (Bilateral) 03/20/2018   Chronic upper extremity pain (4th area of Pain) (Bilateral) (R>L) 03/19/2018   Ganglion cyst 02/20/2018   Chronic low back pain (2ry area of Pain) (Bilateral) (L>R) w/ sciatica (Bilateral) 01/16/2018   Chronic lower extremity pain (3ry area of Pain) (Bilateral) (L>R) 01/16/2018   Chronic neck pain (1ry area of Pain) (Bilateral) (midline) 01/16/2018   Chronic foot pain (Left) 01/16/2018   Chronic pain syndrome 01/16/2018   Disorder of skeletal system 01/16/2018   Problems influencing health status 01/16/2018   Class 1 obesity with serious comorbidity and body mass index (BMI) of 32.0 to 32.9 in adult 11/21/2017   Anxiety 09/28/2017   Chronic knee pain (Right) 09/28/2017   Acute rhinosinusitis 08/08/2017   Hx of cholecystectomy 08/08/2017   Hx of hysterectomy, total 08/08/2017   Other chronic allergic conjunctivitis 08/08/2017   Personal history of other specified conditions 08/08/2017   History of headache 08/08/2017   Fatty liver 07/09/2017   Colon polyps 07/09/2017   Fibromyalgia 07/09/2017   Sinusitis, chronic 07/09/2017   Allergic rhinitis 07/09/2017   IBS (irritable bowel syndrome) 07/09/2017   Essential (primary)  hypertension 01/12/2017   Chronic sacroiliac joint pain (Right) 12/16/2016   Disorder of sacrum 12/16/2016   DDD (degenerative disc disease), lumbar 07/18/2016   Family hx of colon cancer 07/18/2016   PCP:  Myrla Jon HERO, MD Pharmacy:   [facility] [address] [place] [address] Phone: [phone] Fax: [phone]     Social Drivers of Health (SDOH) Social History: SDOH Screenings   Food Insecurity: No Food Insecurity (02/06/2024)  Housing: Low Risk  (02/06/2024)  Transportation Needs: No Transportation Needs (02/06/2024)  Utilities: Not At Risk (02/06/2024)  Alcohol Screen: Low Risk  (09/27/2023)  Depression (PHQ2-9): High Risk (12/25/2023)  Financial Resource Strain: Low Risk  (12/13/2023)   Received from [facility]  Physical Activity: Unknown (09/27/2023)  Social Connections: Unknown (09/27/2023)  Stress: Stress Concern Present (09/27/2023)  Tobacco Use: Medium Risk (02/06/2024)   SDOH Interventions:     Readmission Risk Interventions     No data to display

## 2024-02-07 NOTE — Discharge Summary (Signed)
 Discharge Summary  Patient ID: Elizabeth Bowers MRN: [id number] DOB/AGE: 1966-01-15 58 y.o.  Admit date: 02/06/2024 Discharge date: 02/07/2024  Admission Diagnoses: Cervical radiculopathy Discharge Diagnoses:  Principal Problem:   S/P cervical spinal fusion Active Problems:   Cervical radiculopathy   Discharged Condition: good  Hospital Course:  Elizabeth Bowers a 58 year old presenting with cervical radiculopathy status post C5-6 ACDF. Her intraoperative course was uncomplicated. She was admitted overnight for pain control and therapy evaluation.  She was seen and evaluated by therapy and deemed appropriate for discharge home without further needs on postop day 1.  She did experience some interscapular pain as expected after this type of surgery which was managed with oxycodone , Robaxin , and Valium .  She was discharged with prescriptions for these medications as well as senna.  Consults: None  Significant Diagnostic Studies: NA  Treatments: surgery: as above. Please see separately dictated operative report for further details  Discharge Exam: Blood pressure 124/80, pulse 75, temperature 97.8 F (36.6 C), temperature source Oral, resp. rate 17, height 5' 2 (1.575 m), weight 85 kg, SpO2 94%. AA Ox3 CNI   Strength:5/5 throughout BUE   Incision: c/d/I with dermabond in place  Disposition: Discharge disposition: 01-Home or Self Care       Discharge Instructions     Incentive spirometry RT   Complete by: As directed    No wound care   Complete by: As directed       Allergies as of 02/07/2024       Reactions   Lactose    Other reaction(s): Abdominal Pain, Myalgias (Muscle Pain)   Ranitidine Diarrhea, Other (See Comments)   Diarrhea and stomach cramping   Sulfa Antibiotics Anxiety   Amphetamine-dextroamphetamine Other (See Comments)   Elevated BP   Lactose Intolerance (gi)    bloating   Nsaids    Due to CKD    Reglan [metoclopramide] Other (See Comments)   Causes  muscle spams    Topiramate  Er Other (See Comments)   Severe headache, nausea, vomiting, dizziness, visual disturbance     Wheat    bloating   Gluten Meal Other (See Comments)   Other reaction(s): Abdominal Pain, Myalgias (Muscle Pain)   Soap Rash   PROVON excess use        Medication List     TAKE these medications    amLODipine  10 MG tablet Commonly known as: NORVASC  Take 1 tablet (10 mg) by mouth daily.   chlorhexidine  4 % external liquid Commonly known as: HIBICLENS  Apply 15 mLs (1 Application total) topically as directed for 30 doses. Use as directed daily for 5 days every other week for 6 weeks.   diazepam  2 MG tablet Commonly known as: VALIUM  Take 1 tablet (2 mg total) by mouth every 6 (six) hours as needed for up to 3 days for muscle spasms (refractory to Robaxin ).   diphenhydramine -acetaminophen  25-500 MG Tabs tablet Commonly known as: TYLENOL  PM Take 2 tablets by mouth at bedtime as needed (sleep/pain).   furosemide  20 MG tablet Commonly known as: LASIX  Take 2 tablets (40 mg total) by mouth daily.   hydrOXYzine  10 MG tablet Commonly known as: ATARAX  Take 1 tablet (10 mg total) by mouth 3 (three) times daily as needed.   ipratropium 0.06 % nasal spray Commonly known as: ATROVENT  Place 2 sprays into both nostrils 4 (four) times daily.   lansoprazole  30 MG capsule Commonly known as: PREVACID  Take 1 capsule (30 mg total) by mouth 2 (two) times daily before a  meal.   Linzess  290 MCG Caps capsule Generic drug: linaclotide  Take 1 capsule (290 mcg total) by mouth daily.   methocarbamol  500 MG tablet Commonly known as: ROBAXIN  Take 1 tablet (500 mg total) by mouth every 6 (six) hours as needed for muscle spasms.   multivitamin with minerals Tabs tablet Take 1 tablet by mouth in the morning. Centrum Minis   mupirocin  ointment 2 % Commonly known as: BACTROBAN  Place 1 Application into the nose 2 (two) times daily for 60 doses. Use as directed 2 times daily  for 5 days every other week for 6 weeks. What changed:  how much to take how to take this when to take this additional instructions   naloxone  4 MG/0.1ML Liqd nasal spray kit Commonly known as: NARCAN  Place 1 spray into the nose as needed for up to 365 doses (for opioid-induced respiratory depresssion). In case of emergency (overdose), spray once into each nostril. If no response within 3 minutes, repeat application and call 911.   ondansetron  4 MG disintegrating tablet Commonly known as: ZOFRAN -ODT Take 1 tablet (4 mg total) by mouth every 8 (eight) hours as needed for nausea or vomiting.   oxyCODONE  5 MG immediate release tablet Commonly known as: Oxy IR/ROXICODONE  Take 1-2 tablets (5-10 mg total) by mouth every 4 (four) hours as needed for moderate pain (pain score 4-6) or severe pain (pain score 7-10). What changed:  how much to take when to take this reasons to take this additional instructions   potassium chloride  SA 20 MEQ tablet Commonly known as: KLOR-CON  M Take 2 tablets (40 mEq total) by mouth daily. What changed:  how much to take when to take this   rizatriptan  5 MG disintegrating tablet Commonly known as: MAXALT -MLT Dissolve 1 tablet (5 mg total) in the mouth as directed for Migraine. May take a second dose after 2 hours if needed.   senna 8.6 MG Tabs tablet Commonly known as: SENOKOT Take 1 tablet (8.6 mg total) by mouth 2 (two) times daily as needed for mild constipation.   triamterene -hydrochlorothiazide  37.5-25 MG tablet Commonly known as: MAXZIDE -25 Take 1 tablet by mouth daily.         Signed: Edsel Jama Goods 02/07/2024, 11:00 AM

## 2024-02-07 NOTE — Evaluation (Addendum)
 Occupational Therapy Evaluation Patient Details Name: Elizabeth Bowers MRN: [id number] DOB: February 22, 1966 Today's Date: 02/07/2024   History of Present Illness   58 y/o s/p C5-6 ACDF on 02/06/24. PMH: migraine, HTN, fibromyalgia     Clinical Impressions Pt seen for OT evaluation this date, POD#1 from above surgery. PTA, pt was independent with mobility, ADL, and IADL. No falls in past 12 months. Pt lives with spouse in a single family home with 4 steps to enter. Pt has family available to assist as needed. Currently pt is independent with all aspects of mobility and self care tasks. Pt educated in cervical precautions, self care skills, AE, and home/routines modifications to maximize safety and functional independence while minimizing falls risk and maintaining precautions. Pt verbalized understanding of all education/training provided. Able to return demonstration safe techniques while maintaining cervical precautions throughout. Handout provided to support recall and carry over of learned precautions/techniques for bed mobility, functional transfers, and self care skills. Pt requesting soft collar for reminder to prevent flexing her neck and to avoid frightening her granddaughter with scar, educated pt on purchasing OOP at [place] during session. No additional skilled OT needs at this time. Will discharge in house. Upon hospital discharge, pt safe to discharge home.      If plan is discharge home, recommend the following:         Functional Status Assessment   Patient has not had a recent decline in their functional status     Equipment Recommendations         Recommendations for Other Services         Precautions/Restrictions   Precautions Precautions: Cervical Precaution Booklet Issued: No Restrictions Weight Bearing Restrictions Per Provider Order: No     Mobility Bed Mobility               General bed mobility comments: NT pt in recliner pre/post session     Transfers Overall transfer level: Modified independent Equipment used: None               General transfer comment: able to stand from recliner and walk within room with independence      Balance Overall balance assessment: No apparent balance deficits (not formally assessed)                                         ADL either performed or assessed with clinical judgement   ADL Overall ADL's : Modified independent                                       General ADL Comments: able to perform UB/LB dressing with set up assist following cervical precautions     Vision         Perception         Praxis         Pertinent Vitals/Pain Pain Assessment Pain Assessment: 0-10 Pain Score: 8  Pain Location: B shoulders Pain Descriptors / Indicators: Sore Pain Intervention(s): Monitored during session, Repositioned, Heat applied     Extremity/Trunk Assessment Upper Extremity Assessment Upper Extremity Assessment: Overall WFL for tasks assessed   Lower Extremity Assessment Lower Extremity Assessment: Overall WFL for tasks assessed       Communication Communication Communication: No apparent difficulties   Cognition Arousal: Alert Behavior During Therapy:  WFL for tasks assessed/performed                                 Following commands: Intact       Cueing  General Comments          Exercises Other Exercises Other Exercises: Pt edu on role of OT, cervical precautions, modifications to home environment and use of AE/AD to maximize safety and IND upon DC home.   Shoulder Instructions      Home Living Family/patient expects to be discharged to:: Private residence Living Arrangements: Spouse/significant other Available Help at Discharge: Family;Available 24 hours/day Type of Home: House Home Access: Stairs to enter [employer] of Steps: 4 Entrance Stairs-Rails: Right;Left Home Layout: One  level     Bathroom Shower/Tub: Runner, broadcasting/film/video: Shower seat - built in          Prior Functioning/Environment Prior Level of Function : Independent/Modified Independent;Driving                    OT Problem List: Pain   OT Treatment/Interventions:        OT Goals(Current goals can be found in the care plan section)       OT Frequency:       Co-evaluation              AM-PAC OT 6 Clicks Daily Activity     Outcome Measure Help from another person eating meals?: None Help from another person taking care of personal grooming?: None Help from another person toileting, which includes using toliet, bedpan, or urinal?: None Help from another person bathing (including washing, rinsing, drying)?: None Help from another person to put on and taking off regular upper body clothing?: None Help from another person to put on and taking off regular lower body clothing?: None 6 Click Score: 24   End of Session Nurse Communication: Mobility status  Activity Tolerance: Patient tolerated treatment well Patient left: in chair;with call bell/phone within reach  OT Visit Diagnosis: Pain                Time: 8995-8972 OT Time Calculation (min): 23 min Charges:  OT General Charges $OT Visit: 1 Visit OT Evaluation $OT Eval Low Complexity: 1 Low OT Treatments $Self Care/Home Management : 8-22 mins Aerial Dilley, OTR/L 02/07/24, 12:23 PM  Toniya Rozar E Reginaldo Hazard 02/07/2024, 12:21 PM

## 2024-02-07 NOTE — Progress Notes (Signed)
   Neurosurgery Progress Note  History: Elizabeth Bowers is s/p C5-6 ACDF  POD1: Doing ok this morning. Complaining of shoulder/ neck pain this morning. Is using heat and ice overnight which helps some   Physical Exam: Vitals:   02/07/24 0618 02/07/24 0732  BP: 124/76 124/80  Pulse: 74 75  Resp: 18 17  Temp: (!) 97.5 F (36.4 C) 97.8 F (36.6 C)  SpO2: 95% 94%    AA Ox3 CNI  Strength:5/5 throughout BUE  Incision: c/d/I with dermabond in place  Data:  Other tests/results: NA  Assessment/Plan:  Elizabeth Bowers is a 58 y.o presenting with cervical radiculopathy s/p C5-6 ACDF   - mobilize - pain control - DVT prophylaxis - PTOT; dispo planning underway  Edsel Goods PA-C Department of Neurosurgery

## 2024-02-07 NOTE — Evaluation (Signed)
 Physical Therapy Evaluation Patient Details Name: Elizabeth Bowers MRN: [id number] DOB: [birth year] Today's Date: 02/07/2024  History of Present Illness  58 y/o s/p C5-6 ACDF on 02/06/24. PMH: migraine, HTN, fibromyalgia  Clinical Impression  Patient admitted following the above procedure. PTA, patient lives with husband and other family members and was independent with no AD. Patient functioning at modI level with no AD and able to complete stairs with B handrails modI. Educated patient on cervical precautions and impact on daily living, patient verbalized understanding. No further skilled PT needs identified acutely. No PT follow up recommended at this time.   Patient requesting soft collar for reminder to prevent flexing her neck and to avoid frightening her granddaughter with scar. Notified RN.         If plan is discharge home, recommend the following: Help with stairs or ramp for entrance;Assist for transportation   Can travel by private vehicle        Equipment Recommendations None recommended by PT  Recommendations for Other Services       Functional Status Assessment Patient has had a recent decline in their functional status and demonstrates the ability to make significant improvements in function in a reasonable and predictable amount of time.     Precautions / Restrictions Precautions Precautions: Cervical Precaution Booklet Issued: No Restrictions Weight Bearing Restrictions Per Provider Order: No      Mobility  Bed Mobility Overal bed mobility: Modified Independent                  Transfers Overall transfer level: Modified independent Equipment used: None                    Ambulation/Gait Ambulation/Gait assistance: Modified independent (Device/Increase time) Gait Distance (Feet): 200 Feet Assistive device: None Gait Pattern/deviations: WFL(Within Functional Limits)   Gait velocity interpretation: >4.37 ft/sec, indicative of normal walking  speed      Stairs Stairs: Yes Stairs assistance: Modified independent (Device/Increase time) Stair Management: Two rails, Alternating pattern, Forwards Number of Stairs: 4    Wheelchair Mobility     Tilt Bed    Modified Rankin (Stroke Patients Only)       Balance Overall balance assessment: No apparent balance deficits (not formally assessed)                                           Pertinent Vitals/Pain Pain Assessment Pain Assessment: Faces Faces Pain Scale: Hurts even more Pain Location: B shoulders Pain Descriptors / Indicators: Sore Pain Intervention(s): Limited activity within patient's tolerance, Monitored during session, Repositioned    Home Living Family/patient expects to be discharged to:: Private residence Living Arrangements: Spouse/significant other Available Help at Discharge: Family;Available 24 hours/day Type of Home: House Home Access: Stairs to enter Entrance Stairs-Rails: Doctor, general practice of Steps: 4   Home Layout: One level Home Equipment: Shower seat - built in      Prior Function Prior Level of Function : Independent/Modified Independent;Driving                     Extremity/Trunk Assessment   Upper Extremity Assessment Upper Extremity Assessment: Generalized weakness    Lower Extremity Assessment Lower Extremity Assessment: Generalized weakness       Communication   Communication Communication: No apparent difficulties    Cognition Arousal: Alert Behavior During Therapy: [facility] for tasks  assessed/performed   PT - Cognitive impairments: No apparent impairments                         Following commands: Intact       Cueing       General Comments      Exercises     Assessment/Plan    PT Assessment Patient does not need any further PT services  PT Problem List         PT Treatment Interventions      PT Goals (Current goals can be found in the Care Plan  section)  Acute Rehab PT Goals Patient Stated Goal: to go home PT Goal Formulation: All assessment and education complete, DC therapy    Frequency       Co-evaluation               AM-PAC PT 6 Clicks Mobility  Outcome Measure Help needed turning from your back to your side while in a flat bed without using bedrails?: None Help needed moving from lying on your back to sitting on the side of a flat bed without using bedrails?: None Help needed moving to and from a bed to a chair (including a wheelchair)?: None Help needed standing up from a chair using your arms (e.g., wheelchair or bedside chair)?: None Help needed to walk in hospital room?: None Help needed climbing 3-5 steps with a railing? : None 6 Click Score: 24    End of Session   Activity Tolerance: Patient tolerated treatment well Patient left: in chair;with call bell/phone within reach Nurse Communication: Mobility status PT Visit Diagnosis: Muscle weakness (generalized) (M62.81)    Time: 9149-9093 PT Time Calculation (min) (ACUTE ONLY): 16 min   Charges:   PT Evaluation $PT Eval Low Complexity: 1 Low   PT General Charges $$ ACUTE PT VISIT: 1 Visit         Maryanne Finder, PT, DPT Physical Therapist - [facility], [facility]  [facility]   Kirby Cortese A Annaelle Kasel 02/07/2024, 9:12 AM

## 2024-02-07 NOTE — Plan of Care (Signed)
  Problem: Education: Goal: Knowledge of General Education information will improve Description: Including pain rating scale, medication(s)/side effects and non-pharmacologic comfort measures Outcome: Progressing   Problem: Activity: Goal: Ability to avoid complications of mobility impairment will improve Outcome: Progressing   Problem: Bowel/Gastric: Goal: Gastrointestinal status for postoperative course will improve Outcome: Progressing   Problem: Clinical Measurements: Goal: Ability to maintain clinical measurements within normal limits will improve Outcome: Progressing   Problem: Pain Management: Goal: Pain level will decrease Outcome: Progressing   Problem: Skin Integrity: Goal: Will show signs of wound healing Outcome: Progressing

## 2024-02-08 NOTE — Anesthesia Postprocedure Evaluation (Signed)
 Anesthesia Post Note  Patient: Elizabeth Bowers  Procedure(s) Performed: ANTERIOR CERVICAL DECOMPRESSION/DISCECTOMY FUSION 1 LEVEL (Spine Cervical)  Patient location during evaluation: PACU Anesthesia Type: General Level of consciousness: awake and alert Pain management: pain level controlled Vital Signs Assessment: post-procedure vital signs reviewed and stable Respiratory status: spontaneous breathing, nonlabored ventilation and respiratory function stable Cardiovascular status: blood pressure returned to baseline and stable Postop Assessment: no apparent nausea or vomiting Anesthetic complications: no   No notable events documented.   Last Vitals:  Vitals:   02/07/24 0732 02/07/24 1227  BP: 124/80 121/81  Pulse: 75 88  Resp: 17 17  Temp: 36.6 C 36.5 C  SpO2: 94% 95%    Last Pain:  Vitals:   02/07/24 1227  TempSrc: Oral  PainSc: 3                  Fairy POUR Keitha Kolk

## 2024-02-15 NOTE — Progress Notes (Signed)
   REFERRING PHYSICIAN:  Myrla Jon HERO, Md [address] [place],  [place] [address]  DOS: 02/06/24  ACDF C5-C6  HISTORY OF PRESENT ILLNESS: Elizabeth Bowers is 2 weeks status post above surgery. Given oxycodone , robaxin , and valium  on discharge from the hospital.   She continues with right shoulder pain. She has some pain into right arm. She still has some posterior neck pain. No significant left arm pain. She is having some swallowing issues. Has some discomfort after talking for a long time.   Not taking oxycodone . No relief with robaxin  or valium .   Preop nasal swab was positive for staph and/or MRSA- patient is to continue mupirocin  and chloraprep x 6 weeks.    PHYSICAL EXAMINATION:  NEUROLOGICAL:  General: In no acute distress.   Awake, alert, oriented to person, place, and time.  Pupils equal round and reactive to light.  Facial tone is symmetric.    Strength: Side Biceps Triceps Deltoid Interossei Grip Wrist Ext. Wrist Flex.  R 5 5 5 5 5 5 5   L 5 5 5 5 5 5 5    Incision c/d/i  Imaging:  Nothing new to review.   Assessment / Plan: Tatiyana Kashish Yglesias is doing well s/p above surgery. Treatment options reviewed with patient and following plan made:   - We discussed activity escalation and I have advised the patient to lift up to 10 pounds until 6 weeks after surgery (until follow up with Dr. Clois).   - Reviewed wound care.  - No relief with robaxin  or valium . She will stop these. New prescription for zanaflex  sent to her pharmacy. Reviewed dosing and side effects. Can make her sleepy.  - Preop nasal swab was positive for staph and/or MRSA- patient is to continue mupirocin  and chloraprep x 6 weeks.  - Follow up as scheduled in 4 weeks and prn.   Advised to contact the office if any questions or concerns arise.   Glade Boys PA-C Dept of Neurosurgery

## 2024-02-20 ENCOUNTER — Telehealth: Payer: Self-pay | Admitting: Family

## 2024-02-20 ENCOUNTER — Other Ambulatory Visit: Payer: Self-pay

## 2024-02-20 ENCOUNTER — Ambulatory Visit (INDEPENDENT_AMBULATORY_CARE_PROVIDER_SITE_OTHER): Admitting: Orthopedic Surgery

## 2024-02-20 ENCOUNTER — Encounter: Payer: Self-pay | Admitting: Orthopedic Surgery

## 2024-02-20 VITALS — BP 110/76 | Temp 98.8°F

## 2024-02-20 DIAGNOSIS — Z981 Arthrodesis status: Secondary | ICD-10-CM

## 2024-02-20 DIAGNOSIS — M5412 Radiculopathy, cervical region: Secondary | ICD-10-CM

## 2024-02-20 MED ORDER — TIZANIDINE HCL 4 MG PO TABS
4.0000 mg | ORAL_TABLET | Freq: Three times a day (TID) | ORAL | 0 refills | Status: AC | PRN
  Filled 2024-02-20: qty 60, 20d supply, fill #0

## 2024-02-20 NOTE — Telephone Encounter (Signed)
 Called to confirm/remind patient of their appointment at the [facility] on 02/21/24.   Appointment:   [x] Confirmed  [] Left mess   [] No answer/No voice mail  [] VM Full/unable to leave message  [] Phone not in service  Patient reminded to bring all medications and/or complete list.  Confirmed patient has transportation. Gave directions, instructed to utilize valet parking.

## 2024-02-20 NOTE — Progress Notes (Unsigned)
 [facility] Note    PCP: Myrla Jon HERO, MD Cardiologist: None  HF provider: Gardenia Led, MD  Chief Complaint: shortness of breath   HPI:  Elizabeth Bowers is a 58 y/o female with a history of HTN, headaches (migraine), DDD and shortness of breath.   Was in the ED 12/14/23 with leg swelling, weight gain as well as intermittent dyspnea on exertion for last several weeks. BNP normal. First troponin negative. D-dimer negative. Second troponin negative.   Was seen in the [facility] 12/19/23 where diuretics were adjusted.   Echo 01/11/24: EF 55-60%, normal RV, no valvular disease, dilated IVC  She presents today for a HF follow-up visit with a chief complaint of shortness of breath. Has associated fatigue, severe headaches, worsening pedal edema. Denies chest pain, palpitations, dizziness. Says that she will be having neck surgery to relive the bulging disc as soon as she can get it scheduled.   Says that she was let go from her employer due to her current inability to work due to the severe HA's. Currently has COBRA insurance.   ROS: All systems negative except what is listed in HPI, PMH and Problem List   Past Medical History:  Diagnosis Date   Anxiety    Cervical radiculopathy    Cervicalgia    Cervicogenic headache    Chronic pain syndrome    Chronic upper extremity pain    DDD (degenerative disc disease), cervical    Degenerative tear of glenoid labrum, unspecified laterality    Fatty liver    Fibromyalgia    Foraminal stenosis of cervical region    GERD (gastroesophageal reflux disease)    Hemorrhoids    History of depression    Hot flashes    Hypertension    IBS (irritable bowel syndrome)    LUQ abdominal pain 10/25/2016   Migraines    Miscarriage    x 2; 4 live births    Multilevel degenerative disc disease    Neuroma    Nose colonized with MRSA 01/28/2024   a.) presurgical PCR (+) 01/28/2024 prior to ACDF C5-C6   Pedal edema    Shortness of  breath    Vocal cord dysfunction     Current Outpatient Medications  Medication Sig Dispense Refill   amLODipine  (NORVASC ) 10 MG tablet Take 1 tablet (10 mg) by mouth daily. 90 tablet 1   chlorhexidine  (HIBICLENS ) 4 % external liquid Apply 15 mLs (1 Application total) topically as directed for 30 doses. Use as directed daily for 5 days every other week for 6 weeks. 946 mL 1   diphenhydramine -acetaminophen  (TYLENOL  PM) 25-500 MG TABS tablet Take 2 tablets by mouth at bedtime as needed (sleep/pain).     furosemide  (LASIX ) 20 MG tablet Take 2 tablets (40 mg total) by mouth daily.     hydrOXYzine  (ATARAX ) 10 MG tablet Take 1 tablet (10 mg total) by mouth 3 (three) times daily as needed. 60 tablet 1   ipratropium (ATROVENT ) 0.06 % nasal spray Place 2 sprays into both nostrils 4 (four) times daily. (Patient not taking: Reported on 01/23/2024) 15 mL 12   lansoprazole  (PREVACID ) 30 MG capsule Take 1 capsule (30 mg total) by mouth 2 (two) times daily before a meal. 180 capsule 1   LINZESS  290 MCG CAPS capsule Take 1 capsule (290 mcg total) by mouth daily. 90 capsule 1   methocarbamol  (ROBAXIN ) 500 MG tablet Take 1 tablet (500 mg total) by mouth every 6 (six) hours as needed for muscle  spasms. 120 tablet 0   Multiple Vitamin (MULTIVITAMIN WITH MINERALS) TABS tablet Take 1 tablet by mouth in the morning. Centrum Minis     mupirocin  ointment (BACTROBAN ) 2 % Place 1 Application into the nose 2 (two) times daily for 60 doses. Use as directed 2 times daily for 5 days every other week for 6 weeks. 60 g 0   naloxone  (NARCAN ) nasal spray 4 mg/0.1 mL Place 1 spray into the nose as needed for up to 365 doses (for opioid-induced respiratory depresssion). In case of emergency (overdose), spray once into each nostril. If no response within 3 minutes, repeat application and call 911. 1 each 1   ondansetron  (ZOFRAN -ODT) 4 MG disintegrating tablet Take 1 tablet (4 mg total) by mouth every 8 (eight) hours as needed for nausea  or vomiting. 20 tablet 1   oxyCODONE  (OXY IR/ROXICODONE ) 5 MG immediate release tablet Take 1-2 tablets (5-10 mg total) by mouth every 4 (four) hours as needed for moderate pain (pain score 4-6) or severe pain (pain score 7-10). 45 tablet 0   potassium chloride  SA (KLOR-CON  M) 20 MEQ tablet Take 2 tablets (40 mEq total) by mouth daily. (Patient taking differently: Take 20 mEq by mouth 2 (two) times daily.)     rizatriptan  (MAXALT -MLT) 5 MG disintegrating tablet Dissolve 1 tablet (5 mg total) in the mouth as directed for Migraine. May take a second dose after 2 hours if needed. 6 tablet 1   senna (SENOKOT) 8.6 MG TABS tablet Take 1 tablet (8.6 mg total) by mouth 2 (two) times daily as needed for mild constipation. 30 tablet 0   triamterene -hydrochlorothiazide  (MAXZIDE -25) 37.5-25 MG tablet Take 1 tablet by mouth daily. 90 tablet 1   No current facility-administered medications for this visit.    Allergies  Allergen Reactions   Lactose     Other reaction(s): Abdominal Pain, Myalgias (Muscle Pain)    Ranitidine Diarrhea and Other (See Comments)    Diarrhea and stomach cramping   Sulfa Antibiotics Anxiety   Amphetamine-Dextroamphetamine Other (See Comments)    Elevated BP   Lactose Intolerance (Gi)     bloating   Nsaids     Due to CKD    Reglan [Metoclopramide] Other (See Comments)    Causes muscle spams    Topiramate  Er Other (See Comments)    Severe headache, nausea, vomiting, dizziness, visual disturbance     Wheat     bloating   Gluten Meal Other (See Comments)    Other reaction(s): Abdominal Pain, Myalgias (Muscle Pain)    Soap Rash    PROVON excess use      Social History   Socioeconomic History   Marital status: Married    Spouse name: Elizabeth Bowers   Number of children: 6   Years of education: 14   Highest education level: Associate degree: occupational, Scientist, product/process development, or vocational program  Occupational History   Occupation: Science writer: [employer]  Tobacco  Use   Smoking status: Former    Current packs/day: 0.00    Average packs/day: 0.8 packs/day for 5.0 years (3.8 ttl pk-yrs)    Types: Cigarettes    Start date: 07/05/1982    Quit date: 07/06/1987    Years since quitting: 36.6    Passive exposure: Never   Smokeless tobacco: Never  Vaping Use   Vaping status: Never Used  Substance and Sexual Activity   Alcohol use: Yes    Alcohol/week: 1.0 - 2.0 standard drink of alcohol  Types: 1 - 2 Standard drinks or equivalent per week    Comment: 1-2 times a week   Drug use: Never   Sexual activity: Not on file  Other Topics Concern   Not on file  Social History Narrative   Not on file   Social Drivers of Health   Financial Resource Strain: Low Risk  (12/13/2023)   Received from [facility]   Overall Financial Resource Strain (CARDIA)    Difficulty of Paying Living Expenses: Not very hard  Food Insecurity: No Food Insecurity (02/06/2024)   Hunger Vital Sign    Worried About Running Out of Food in the Last Year: Never true    Ran Out of Food in the Last Year: Never true  Transportation Needs: No Transportation Needs (02/06/2024)   PRAPARE - Administrator, Civil Service (Medical): No    Lack of Transportation (Non-Medical): No  Physical Activity: Unknown (09/27/2023)   Exercise Vital Sign    Days of Exercise per Week: Patient declined    Minutes of Exercise per Session: Not on file  Stress: Stress Concern Present (09/27/2023)   [employer] of Occupational Health - Occupational Stress Questionnaire    Feeling of Stress : Very much  Social Connections: Unknown (09/27/2023)   Social Connection and Isolation Panel    Frequency of Communication with Friends and Family: Patient declined    Frequency of Social Gatherings with Friends and Family: Patient declined    Attends Religious Services: Patient declined    Database administrator or Organizations: No    Attends Engineer, structural: Not on file     Marital Status: Married  Catering manager Violence: Not At Risk (02/06/2024)   Humiliation, Afraid, Rape, and Kick questionnaire    Fear of Current or Ex-Partner: No    Emotionally Abused: No    Physically Abused: No    Sexually Abused: No      Family History  Problem Relation Age of Onset   Hypertension Mother    Hyperlipidemia Mother    Glaucoma Mother    Diabetes Mother        type 2    COPD Father        emphysema   Alcohol abuse Father    Colon cancer Sister    Cancer Sister        rectal/colon cancer no h/o IBD   Colon polyps Sister    Asthma Sister    COPD Sister    Kidney disease Sister    Mental illness Sister        bipolar    Miscarriages / [place] Sister    Uterine cancer Sister    Alcohol abuse Son    Depression Son    Diabetes Son    Drug abuse Son    Mental illness Son        bipolar    Diabetes Maternal Grandmother    COPD Paternal Grandmother    Cancer Paternal Grandfather        FH colon cancer maternal aunts/uncles    Miscarriages / Stillbirths Sister    Arthritis Son    Arthritis Son    Breast cancer Neg Hx    There were no vitals filed for this visit.  Wt Readings from Last 3 Encounters:  02/06/24 187 lb 6.3 oz (85 kg)  01/28/24 187 lb 3.2 oz (84.9 kg)  01/17/24 192 lb (87.1 kg)   Lab Results  Component Value Date  CREATININE 1.02 (H) 01/28/2024   CREATININE 1.14 (H) 12/27/2023   CREATININE 1.06 (H) 12/19/2023    PHYSICAL EXAM:  General: Well appearing although sitting with eyes closed and light off in room due to headache Cor: No JVD. Regular rhythm, rate.  Lungs: clear Abdomen: soft, nontender, nondistended. Extremities: 1+ pitting edema bilateral lower legs Neuro:. Affect pleasant, tearful when discussing her job loss   ECG: not done  ReDs reading: 25 %, normal   ASSESSMENT & PLAN:  1: Shortness of breath- - unclear etiology of shortness of breath/ edema. No signs of HF on echo - NYHA class III - moderately  fluid overloaded with symptoms/ edema - Reds 25% as her fluid retention is lower legs - Echo 01/11/24: EF 55-60%, normal RV, no valvular disease, dilated IVC. Reviewed this with patient today - weight up 3 pounds from last visit 3 weeks ago - increase furosemide  to 40mg  daily - increase potassium to 40meq daily - BMET in 2 weeks - BNP 12/19/23 was 10.8  2: HTN- - BP 130/74 - continue amlodipine , maxide - saw PCP Lollie) 07/25  - BMET 12/27/23 reviewed: sodium 141, potassium 3.7, creatinine 1.14 and GFR 55  3: Cervicalgia- - to have cervical surgery in the near future - says that her HA's are a result of her disc disease   Return in 1 month, sooner if needed.   Ellouise DELENA Class, FNP 02/20/24

## 2024-02-21 ENCOUNTER — Ambulatory Visit: Payer: Self-pay | Attending: Family | Admitting: Family

## 2024-02-21 ENCOUNTER — Other Ambulatory Visit: Payer: Self-pay

## 2024-02-21 ENCOUNTER — Encounter: Payer: Self-pay | Admitting: Family

## 2024-02-21 VITALS — BP 125/78 | HR 92 | Wt 187.0 lb

## 2024-02-21 DIAGNOSIS — R519 Headache, unspecified: Secondary | ICD-10-CM | POA: Insufficient documentation

## 2024-02-21 DIAGNOSIS — I10 Essential (primary) hypertension: Secondary | ICD-10-CM | POA: Diagnosis not present

## 2024-02-21 DIAGNOSIS — Z981 Arthrodesis status: Secondary | ICD-10-CM | POA: Insufficient documentation

## 2024-02-21 DIAGNOSIS — M542 Cervicalgia: Secondary | ICD-10-CM | POA: Insufficient documentation

## 2024-02-21 DIAGNOSIS — Z87891 Personal history of nicotine dependence: Secondary | ICD-10-CM | POA: Diagnosis not present

## 2024-02-21 DIAGNOSIS — R5383 Other fatigue: Secondary | ICD-10-CM | POA: Insufficient documentation

## 2024-02-21 DIAGNOSIS — R609 Edema, unspecified: Secondary | ICD-10-CM | POA: Diagnosis not present

## 2024-02-21 DIAGNOSIS — R0602 Shortness of breath: Secondary | ICD-10-CM | POA: Diagnosis not present

## 2024-02-21 DIAGNOSIS — Z79899 Other long term (current) drug therapy: Secondary | ICD-10-CM | POA: Diagnosis not present

## 2024-02-21 MED ORDER — POTASSIUM CHLORIDE CRYS ER 20 MEQ PO TBCR
20.0000 meq | EXTENDED_RELEASE_TABLET | Freq: Two times a day (BID) | ORAL | 3 refills | Status: DC
  Filled 2024-02-21: qty 45, 23d supply, fill #0
  Filled 2024-02-21: qty 180, 90d supply, fill #0

## 2024-02-21 MED ORDER — FUROSEMIDE 20 MG PO TABS
ORAL_TABLET | ORAL | 11 refills | Status: AC
  Filled 2024-02-21: qty 45, fill #0
  Filled 2024-02-21: qty 135, 90d supply, fill #0
  Filled 2024-02-21: qty 45, 30d supply, fill #0
  Filled 2024-03-27 – 2024-05-15 (×2): qty 135, 90d supply, fill #1

## 2024-02-21 MED ORDER — FUROSEMIDE 20 MG PO TABS: 40.0000 mg | ORAL_TABLET | ORAL | Status: DC

## 2024-02-21 MED ORDER — FUROSEMIDE 20 MG PO TABS: ORAL_TABLET | ORAL | Status: DC

## 2024-02-21 NOTE — Patient Instructions (Signed)
 Medication Changes:  NO MEDICATION CHANGES.   Lab Work:  Go downstairs to [place] [facility] on LOWER LEVEL to have your blood work completed.  We will only call you if the results are abnormal or if the provider would like to make medication changes.  No news is good news.   Special Instructions // Education:  It was good to see you today!  If you receive a satisfaction survey regarding the [facility] Clinic, please take the time to fill it out. This way we can continue to provide excellent care and make any changes that need to be made.   Follow-Up in: AS NEEDED FOR ANY NEW OR WORSENING SYMPTOMS.  At the [facility], you and your health needs are our priority. We have a designated team specialized in the treatment of Heart Failure. This Care Team includes your primary Heart Failure Specialized Cardiologist (physician), Advanced Practice Providers (APPs- Physician Assistants and Nurse Practitioners), and Pharmacist who all work together to provide you with the care you need, when you need it.   You may see any of the following providers on your designated Care Team at your next follow up:  Dr. Toribio Fuel Dr. Ezra Shuck Dr. Ria Commander Dr. Odis Brownie Greig Mosses, NP Caffie Shed, [place] [facility] [place], [place] Beckey Coe, NP [place] Lee, NP Tinnie Redman, PharmD   Please be sure to bring in all your medications bottles to every appointment.   Need to Contact Us :  If you have any questions or concerns before your next appointment please send us  a message through [place] or call our office at [phone].    TO LEAVE A MESSAGE FOR THE NURSE SELECT OPTION 2, PLEASE LEAVE A MESSAGE INCLUDING: YOUR NAME DATE OF BIRTH CALL BACK NUMBER REASON FOR CALL**this is important as we prioritize the call backs  YOU WILL RECEIVE A CALL BACK THE SAME DAY AS LONG AS YOU CALL BEFORE 4:00 PM

## 2024-02-22 ENCOUNTER — Ambulatory Visit: Payer: Self-pay | Admitting: Family

## 2024-02-22 LAB — BASIC METABOLIC PANEL WITH GFR
BUN/Creatinine Ratio: 9 (ref 9–23)
BUN: 9 mg/dL (ref 6–24)
CO2: 24 mmol/L (ref 20–29)
Calcium: 10.2 mg/dL (ref 8.7–10.2)
Chloride: 99 mmol/L (ref 96–106)
Creatinine, Ser: 0.98 mg/dL (ref 0.57–1.00)
Glucose: 102 mg/dL — ABNORMAL HIGH (ref 70–99)
Potassium: 3.8 mmol/L (ref 3.5–5.2)
Sodium: 141 mmol/L (ref 134–144)
eGFR: 66 mL/min/1.73 (ref >59)

## 2024-03-04 ENCOUNTER — Other Ambulatory Visit (HOSPITAL_COMMUNITY): Payer: Self-pay

## 2024-03-10 ENCOUNTER — Encounter: Payer: Self-pay | Admitting: Family Medicine

## 2024-03-13 ENCOUNTER — Other Ambulatory Visit: Payer: Self-pay

## 2024-03-13 DIAGNOSIS — M5412 Radiculopathy, cervical region: Secondary | ICD-10-CM

## 2024-03-18 ENCOUNTER — Other Ambulatory Visit (HOSPITAL_COMMUNITY): Payer: Self-pay

## 2024-03-20 ENCOUNTER — Ambulatory Visit

## 2024-03-20 ENCOUNTER — Ambulatory Visit (INDEPENDENT_AMBULATORY_CARE_PROVIDER_SITE_OTHER): Admitting: Neurosurgery

## 2024-03-20 ENCOUNTER — Encounter: Payer: Self-pay | Admitting: Neurosurgery

## 2024-03-20 VITALS — BP 130/74 | Temp 99.2°F | Ht 62.0 in | Wt 194.0 lb

## 2024-03-20 DIAGNOSIS — M5412 Radiculopathy, cervical region: Secondary | ICD-10-CM

## 2024-03-20 DIAGNOSIS — Z09 Encounter for follow-up examination after completed treatment for conditions other than malignant neoplasm: Secondary | ICD-10-CM | POA: Diagnosis not present

## 2024-03-20 DIAGNOSIS — Z981 Arthrodesis status: Secondary | ICD-10-CM

## 2024-03-20 DIAGNOSIS — M47812 Spondylosis without myelopathy or radiculopathy, cervical region: Secondary | ICD-10-CM | POA: Diagnosis not present

## 2024-03-20 NOTE — Progress Notes (Signed)
   REFERRING PHYSICIAN:  Myrla Bowers HERO, Md [address] [place],  [place] [address]  DOS: 02/06/24  ACDF C5-C6  HISTORY OF PRESENT ILLNESS: Elizabeth Bowers is status post above surgery.   She is doing okay.  She continues to have difficulty with headaches.  Her numbness has not improved.   PHYSICAL EXAMINATION:  NEUROLOGICAL:  General: In no acute distress.   Awake, alert, oriented to person, place, and time.  Pupils equal round and reactive to light.  Facial tone is symmetric.    Strength: Side Biceps Triceps Deltoid Interossei Grip Wrist Ext. Wrist Flex.  R 5 5 5 5 5 5 5   L 5 5 5 5 5 5 5    Incision c/d/i  Imaging:  No complications noted  Assessment / Plan: Elizabeth Bowers is doing well s/p above surgery.   I think her numbness may improve over the coming months.  We reviewed activity limitations.  We will see her back in clinic in 6 weeks.   Elizabeth Bowers  Dept of Neurosurgery

## 2024-03-25 ENCOUNTER — Encounter: Payer: Self-pay | Admitting: Family Medicine

## 2024-03-25 ENCOUNTER — Ambulatory Visit: Admitting: Family Medicine

## 2024-03-25 VITALS — BP 113/69 | HR 69 | Ht 62.0 in | Wt 194.2 lb

## 2024-03-25 DIAGNOSIS — E669 Obesity, unspecified: Secondary | ICD-10-CM

## 2024-03-25 DIAGNOSIS — Z0001 Encounter for general adult medical examination with abnormal findings: Secondary | ICD-10-CM

## 2024-03-25 DIAGNOSIS — E782 Mixed hyperlipidemia: Secondary | ICD-10-CM

## 2024-03-25 DIAGNOSIS — I10 Essential (primary) hypertension: Secondary | ICD-10-CM

## 2024-03-25 DIAGNOSIS — Z0000 Encounter for general adult medical examination without abnormal findings: Secondary | ICD-10-CM | POA: Diagnosis not present

## 2024-03-25 DIAGNOSIS — R739 Hyperglycemia, unspecified: Secondary | ICD-10-CM

## 2024-03-25 DIAGNOSIS — Z1231 Encounter for screening mammogram for malignant neoplasm of breast: Secondary | ICD-10-CM | POA: Diagnosis not present

## 2024-03-25 DIAGNOSIS — N1831 Chronic kidney disease, stage 3a: Secondary | ICD-10-CM | POA: Diagnosis not present

## 2024-03-25 DIAGNOSIS — G4733 Obstructive sleep apnea (adult) (pediatric): Secondary | ICD-10-CM | POA: Diagnosis not present

## 2024-03-25 DIAGNOSIS — Z23 Encounter for immunization: Secondary | ICD-10-CM

## 2024-03-25 DIAGNOSIS — Z22322 Carrier or suspected carrier of Methicillin resistant Staphylococcus aureus: Secondary | ICD-10-CM

## 2024-03-25 NOTE — Patient Instructions (Signed)

## 2024-03-25 NOTE — Progress Notes (Signed)
 Complete physical exam   Patient: Elizabeth Bowers   DOB: [birth year]   58 y.o. Female  MRN: [id number] Visit Date: 03/25/2024  Today's healthcare provider: Jon Eva, MD   Chief Complaint  Patient presents with   Annual Exam    Last completed 10/26/22 Diet -  healthy Exercise - none currently due to surgery in September Feeling - fairly well Sleeping - well now that she has cpap Concerns -     Subjective    Elizabeth Bowers is a 58 y.o. female who presents today for a complete physical exam.    Discussed the use of AI scribe software for clinical note transcription with the patient, who gave verbal consent to proceed.  History of Present Illness   Elizabeth Bowers is a 58 year old female with chronic kidney disease stage 3, hypertension, and chronic migraines who presents for a follow-up visit.  She has chronic kidney disease stage 3 and is on Lasix , with concerns about potassium levels due to the high dose. Swelling is present but has improved. Hypertension is managed by cardiology.  Chronic migraines persist despite treatment with injections and neurology consultations. Migraines cause daily headaches, vomiting, and have led to job loss due to their severity.  She experienced a fall in December, resulting in three slipped discs and a bruised scapula, exacerbating her spinal issues. Surgery was performed to remove the C5-C6 disc and insert a spacer. Post-surgery, she has significant back pain.  She has prediabetes and is concerned about her A1c levels. A history of MRSA is noted, with treatment using a nasal ointment. She awaits confirmation of clearance.  Sleep apnea is managed with an auto CPAP machine, improving sleep and reducing nocturnal urination. She has osteoarthritis.        Last depression screening scores    03/25/2024    2:40 PM 12/25/2023    9:45 AM 12/19/2023    2:01 PM  PHQ 2/9 Scores  PHQ - 2 Score 6 4 6   PHQ- 9 Score 18 20    Last fall  risk screening    03/25/2024    2:41 PM  Fall Risk   Falls in the past year? 1  Number falls in past yr: 1  Injury with Fall? 1  Risk for fall due to : History of fall(s)  Follow up Falls evaluation completed        Medications: Outpatient Medications Prior to Visit  Medication Sig   amLODipine  (NORVASC ) 10 MG tablet Take 1 tablet (10 mg) by mouth daily.   diphenhydramine -acetaminophen  (TYLENOL  PM) 25-500 MG TABS tablet Take 2 tablets by mouth at bedtime as needed (sleep/pain).   furosemide  (LASIX ) 20 MG tablet Take 2 tablets (40mg ) by mouth  furosemide  every other day alternating with 1 tablet (20mg ) every other day   hydrOXYzine  (ATARAX ) 10 MG tablet Take 1 tablet (10 mg total) by mouth 3 (three) times daily as needed.   ipratropium (ATROVENT ) 0.06 % nasal spray Place 2 sprays into both nostrils 4 (four) times daily.   lansoprazole  (PREVACID ) 30 MG capsule Take 1 capsule (30 mg total) by mouth 2 (two) times daily before a meal.   LINZESS  290 MCG CAPS capsule Take 1 capsule (290 mcg total) by mouth daily.   Multiple Vitamin (MULTIVITAMIN WITH MINERALS) TABS tablet Take 1 tablet by mouth in the morning. Centrum Minis   naloxone  (NARCAN ) nasal spray 4 mg/0.1 mL Place 1 spray into the nose as needed for up to  365 doses (for opioid-induced respiratory depresssion). In case of emergency (overdose), spray once into each nostril. If no response within 3 minutes, repeat application and call 911.   ondansetron  (ZOFRAN -ODT) 4 MG disintegrating tablet Take 1 tablet (4 mg total) by mouth every 8 (eight) hours as needed for nausea or vomiting.   oxyCODONE  (OXY IR/ROXICODONE ) 5 MG immediate release tablet Take 1-2 tablets (5-10 mg total) by mouth every 4 (four) hours as needed for moderate pain (pain score 4-6) or severe pain (pain score 7-10).   potassium chloride  SA (KLOR-CON  M) 20 MEQ tablet Take 1 tablet (20 mEq total) by mouth 2 (two) times daily.   rizatriptan  (MAXALT -MLT) 5 MG disintegrating  tablet Dissolve 1 tablet (5 mg total) in the mouth as directed for Migraine. May take a second dose after 2 hours if needed.   tiZANidine  (ZANAFLEX ) 4 MG tablet Take 1 tablet (4 mg total) by mouth every 8 (eight) hours as needed for muscle spasms. Can cause drowsiness.   triamterene -hydrochlorothiazide  (MAXZIDE -25) 37.5-25 MG tablet Take 1 tablet by mouth daily.   No facility-administered medications prior to visit.    Review of Systems    Objective    BP 113/69 (BP Location: Left Arm, Patient Position: Sitting)   Pulse 69   Ht 5' 2 (1.575 m)   Wt 194 lb 3.2 oz (88.1 kg)   SpO2 100%   BMI 35.52 kg/m    Physical Exam Vitals reviewed.  Constitutional:      General: She is not in acute distress.    Appearance: Normal appearance. She is well-developed. She is not diaphoretic.  HENT:     Head: Normocephalic and atraumatic.     Right Ear: Tympanic membrane, ear canal and external ear normal.     Left Ear: Tympanic membrane, ear canal and external ear normal.     Nose: Nose normal.     Mouth/Throat:     Mouth: Mucous membranes are moist.     Pharynx: Oropharynx is clear. No oropharyngeal exudate.  Eyes:     General: No scleral icterus.    Conjunctiva/sclera: Conjunctivae normal.     Pupils: Pupils are equal, round, and reactive to light.  Neck:     Thyroid: No thyromegaly.  Cardiovascular:     Rate and Rhythm: Normal rate and regular rhythm.     Heart sounds: Normal heart sounds. No murmur heard. Pulmonary:     Effort: Pulmonary effort is normal. No respiratory distress.     Breath sounds: Normal breath sounds. No wheezing or rales.  Abdominal:     General: There is no distension.     Palpations: Abdomen is soft.     Tenderness: There is no abdominal tenderness.  Musculoskeletal:        General: No deformity.     Cervical back: Neck supple.     Right lower leg: No edema.     Left lower leg: No edema.  Lymphadenopathy:     Cervical: No cervical adenopathy.  Skin:     General: Skin is warm and dry.     Findings: No rash.  Neurological:     Mental Status: She is alert and oriented to person, place, and time. Mental status is at baseline.     Gait: Gait normal.  Psychiatric:        Mood and Affect: Mood normal.        Behavior: Behavior normal.        Thought Content: Thought content normal.  No results found for any visits on 03/25/24.  Assessment & Plan    Routine Health Maintenance and Physical Exam  Exercise Activities and Dietary recommendations  Goals   None     Immunization History  Administered Date(s) Administered   Influenza, Seasonal, Injecte, Preservative Fre 03/25/2024   Influenza,inj,Quad PF,6+ Mos 03/17/2019, 02/12/2020   Influenza-Unspecified 02/14/2018, 03/07/2021, 03/22/2022, 03/20/2023   PFIZER(Purple Top)SARS-COV-2 Vaccination 06/30/2019, 07/22/2019, 03/19/2020   PNEUMOCOCCAL CONJUGATE-20 03/25/2024   Tdap 03/05/2017   Zoster Recombinant(Shingrix) 06/07/2020, 10/01/2020    Health Maintenance  Topic Date Due   Hepatitis B Vaccines 19-59 Average Risk (1 of 3 - 19+ 3-dose series) Never done   COVID-19 Vaccine (4 - 2025-26 season) 02/04/2024   Mammogram  10/18/2024   DTaP/Tdap/Td (2 - Td or Tdap) 03/06/2027   Colonoscopy  01/01/2028   Pneumococcal Vaccine: 50+ Years  Completed   Influenza Vaccine  Completed   Hepatitis C Screening  Completed   HIV Screening  Completed   Zoster Vaccines- Shingrix  Completed   HPV VACCINES  Aged Out   Meningococcal B Vaccine  Aged Out    Discussed health benefits of physical activity, and encouraged her to engage in regular exercise appropriate for her age and condition.   Problem List Items Addressed This Visit       Cardiovascular and Mediastinum   Essential (primary) hypertension (Chronic)   Blood pressure was excellent during the visit. - Continue current antihypertensive regimen      Relevant Orders   Comprehensive metabolic panel with GFR   TSH     Respiratory    OSA (obstructive sleep apnea)   Managed with auto CPAP, improving sleep quality and reducing nocturia. - Continue auto CPAP therapy         Genitourinary   Stage 3 chronic kidney disease (HCC) (Chronic)   Chronic kidney disease stage 3, likely secondary to hypertension. Monitoring kidney function and potassium levels due to Lasix  use. - Ordered CMP to monitor kidney function and potassium levels      Relevant Orders   Comprehensive metabolic panel with GFR   TSH     Other   Moderate mixed hyperlipidemia not requiring statin therapy   Reviewed last lipid panel Not currently on a statin Recheck FLP and CMP Discussed diet and exercise       Relevant Orders   Lipid panel   Comprehensive metabolic panel with GFR   TSH   Obesity (BMI 30-39.9)   Discussed importance of healthy weight management Discussed diet and exercise       Relevant Orders   Lipid panel   Comprehensive metabolic panel with GFR   Hemoglobin A1c   TSH   Other Visit Diagnoses       Encounter for annual physical exam    -  Primary   Relevant Orders   Lipid panel   Comprehensive metabolic panel with GFR   Hemoglobin A1c   TSH     Immunization due       Relevant Orders   Flu vaccine trivalent PF, 6mos and older(Flulaval,Afluria,Fluarix,Fluzone) (Completed)   Pneumococcal conjugate vaccine 20-valent (Completed)     Positive result for methicillin resistant Staphylococcus aureus (MRSA) screening       Relevant Orders   MRSA MSSA Screening Culture     Hyperglycemia       Relevant Orders   Hemoglobin A1c   TSH     Breast cancer screening by mammogram       Relevant Orders  MM 3D SCREENING MAMMOGRAM BILATERAL BREAST           Adult Wellness Visit Routine wellness visit with discussion on general health maintenance, screenings, and vaccinations. - Ordered mammogram - Scheduled six-month follow-up for blood pressure  Chronic migraine Chronic migraines persisting since March, with daily  headaches and debilitating episodes. Previous cervical spine surgery did not alleviate migraines. Neurology follow-up is pending. Discussion about potential Botox treatment for migraines, with plans to discuss with neurologist. - Discuss Botox treatment for migraines with neurologist - Ordered TSH to rule out thyroid issues      Return in about 6 months (around 09/23/2024) for chronic disease f/u.     Jon Eva, MD  [facility] [phone] (phone) [phone] (fax)  [facility]

## 2024-03-25 NOTE — Assessment & Plan Note (Signed)
 Blood pressure was excellent during the visit. - Continue current antihypertensive regimen

## 2024-03-25 NOTE — Assessment & Plan Note (Signed)
 Chronic kidney disease stage 3, likely secondary to hypertension. Monitoring kidney function and potassium levels due to Lasix  use. - Ordered CMP to monitor kidney function and potassium levels

## 2024-03-25 NOTE — Assessment & Plan Note (Signed)
 Discussed importance of healthy weight management Discussed diet and exercise

## 2024-03-25 NOTE — Assessment & Plan Note (Signed)
 Reviewed last lipid panel Not currently on a statin Recheck FLP and CMP Discussed diet and exercise

## 2024-03-25 NOTE — Assessment & Plan Note (Signed)
 Managed with auto CPAP, improving sleep quality and reducing nocturia. - Continue auto CPAP therapy

## 2024-03-26 LAB — COMPREHENSIVE METABOLIC PANEL WITH GFR
ALT: 134 IU/L — ABNORMAL HIGH (ref 0–32)
AST: 73 IU/L — ABNORMAL HIGH (ref 0–40)
Albumin: 4.4 g/dL (ref 3.8–4.9)
Alkaline Phosphatase: 123 IU/L (ref 49–135)
BUN/Creatinine Ratio: 12 (ref 9–23)
BUN: 12 mg/dL (ref 6–24)
Bilirubin Total: 0.6 mg/dL (ref 0.0–1.2)
CO2: 27 mmol/L (ref 20–29)
Calcium: 9.8 mg/dL (ref 8.7–10.2)
Chloride: 98 mmol/L (ref 96–106)
Creatinine, Ser: 0.99 mg/dL (ref 0.57–1.00)
Globulin, Total: 2.2 g/dL (ref 1.5–4.5)
Glucose: 101 mg/dL — ABNORMAL HIGH (ref 70–99)
Potassium: 3.4 mmol/L — ABNORMAL LOW (ref 3.5–5.2)
Sodium: 140 mmol/L (ref 134–144)
Total Protein: 6.6 g/dL (ref 6.0–8.5)
eGFR: 66 mL/min/1.73 (ref >59)

## 2024-03-26 LAB — LIPID PANEL
Chol/HDL Ratio: 2.9 ratio (ref 0.0–4.4)
Cholesterol, Total: 134 mg/dL (ref 100–199)
HDL: 47 mg/dL (ref >39)
LDL Chol Calc (NIH): 59 mg/dL (ref 0–99)
Triglycerides: 163 mg/dL — ABNORMAL HIGH (ref 0–149)
VLDL Cholesterol Cal: 28 mg/dL (ref 5–40)

## 2024-03-26 LAB — HEMOGLOBIN A1C
Est. average glucose Bld gHb Est-mCnc: 114 mg/dL
Hgb A1c MFr Bld: 5.6 % (ref 4.8–5.6)

## 2024-03-26 LAB — TSH: TSH: 2.54 u[IU]/mL (ref 0.450–4.500)

## 2024-03-27 ENCOUNTER — Other Ambulatory Visit (HOSPITAL_COMMUNITY): Payer: Self-pay

## 2024-03-27 LAB — RESULT

## 2024-03-27 LAB — MRSA MSSA SCREENING CULTURE

## 2024-03-28 ENCOUNTER — Other Ambulatory Visit: Payer: Self-pay

## 2024-03-28 ENCOUNTER — Ambulatory Visit: Payer: Self-pay | Admitting: Family Medicine

## 2024-03-28 DIAGNOSIS — N1831 Chronic kidney disease, stage 3a: Secondary | ICD-10-CM

## 2024-03-28 DIAGNOSIS — I10 Essential (primary) hypertension: Secondary | ICD-10-CM

## 2024-04-03 ENCOUNTER — Other Ambulatory Visit (HOSPITAL_COMMUNITY): Payer: Self-pay

## 2024-04-09 ENCOUNTER — Other Ambulatory Visit: Payer: Self-pay

## 2024-04-09 ENCOUNTER — Encounter: Payer: Self-pay | Admitting: Family Medicine

## 2024-04-09 DIAGNOSIS — Z111 Encounter for screening for respiratory tuberculosis: Secondary | ICD-10-CM

## 2024-04-21 ENCOUNTER — Other Ambulatory Visit: Payer: Self-pay

## 2024-04-21 ENCOUNTER — Other Ambulatory Visit (HOSPITAL_COMMUNITY): Payer: Self-pay

## 2024-04-21 ENCOUNTER — Other Ambulatory Visit: Payer: Self-pay | Admitting: Family Medicine

## 2024-04-21 DIAGNOSIS — F32A Depression, unspecified: Secondary | ICD-10-CM | POA: Diagnosis not present

## 2024-04-21 DIAGNOSIS — N1831 Chronic kidney disease, stage 3a: Secondary | ICD-10-CM | POA: Diagnosis not present

## 2024-04-21 DIAGNOSIS — M47812 Spondylosis without myelopathy or radiculopathy, cervical region: Secondary | ICD-10-CM | POA: Diagnosis not present

## 2024-04-21 DIAGNOSIS — M797 Fibromyalgia: Secondary | ICD-10-CM | POA: Diagnosis not present

## 2024-04-21 DIAGNOSIS — F419 Anxiety disorder, unspecified: Secondary | ICD-10-CM | POA: Diagnosis not present

## 2024-04-21 DIAGNOSIS — G4733 Obstructive sleep apnea (adult) (pediatric): Secondary | ICD-10-CM | POA: Diagnosis not present

## 2024-04-21 DIAGNOSIS — M5412 Radiculopathy, cervical region: Secondary | ICD-10-CM

## 2024-04-21 DIAGNOSIS — G43711 Chronic migraine without aura, intractable, with status migrainosus: Secondary | ICD-10-CM | POA: Diagnosis not present

## 2024-04-21 MED ORDER — AJOVY 225 MG/1.5ML ~~LOC~~ SOAJ
225.0000 mg | SUBCUTANEOUS | 11 refills | Status: AC
  Filled 2024-04-21 (×2): qty 1.5, 30d supply, fill #0
  Filled 2024-05-15: qty 1.5, 30d supply, fill #1
  Filled 2024-06-15: qty 1.5, 30d supply, fill #2

## 2024-04-22 ENCOUNTER — Other Ambulatory Visit: Payer: Self-pay

## 2024-04-22 ENCOUNTER — Ambulatory Visit: Payer: Self-pay | Admitting: Family Medicine

## 2024-04-22 DIAGNOSIS — R7989 Other specified abnormal findings of blood chemistry: Secondary | ICD-10-CM

## 2024-04-22 LAB — COMPREHENSIVE METABOLIC PANEL WITH GFR
ALT: 59 IU/L — ABNORMAL HIGH (ref 0–32)
AST: 39 IU/L (ref 0–40)
Albumin: 4.5 g/dL (ref 3.8–4.9)
Alkaline Phosphatase: 106 IU/L (ref 49–135)
BUN/Creatinine Ratio: 10 (ref 9–23)
BUN: 11 mg/dL (ref 6–24)
Bilirubin Total: 0.4 mg/dL (ref 0.0–1.2)
CO2: 24 mmol/L (ref 20–29)
Calcium: 9.6 mg/dL (ref 8.7–10.2)
Chloride: 101 mmol/L (ref 96–106)
Creatinine, Ser: 1.07 mg/dL — ABNORMAL HIGH (ref 0.57–1.00)
Globulin, Total: 2 g/dL (ref 1.5–4.5)
Glucose: 108 mg/dL — ABNORMAL HIGH (ref 70–99)
Potassium: 3.6 mmol/L (ref 3.5–5.2)
Sodium: 140 mmol/L (ref 134–144)
Total Protein: 6.5 g/dL (ref 6.0–8.5)
eGFR: 60 mL/min/1.73 (ref >59)

## 2024-04-23 NOTE — Progress Notes (Unsigned)
   REFERRING PHYSICIAN:  Myrla Jon HERO, Md [address] [place],  [place] [address]  DOS: 02/06/24  ACDF C5-C6  HISTORY OF PRESENT ILLNESS:  She still had headaches and numbness at her last visit.   In last week, she's had increased pain in both hands that wakes her up at night with numbness and tingling. She also had fall last Saturday and landed on her outstretched arms. Has some pain in left elbow since fall.   No neck or arm pain. She has been wearing wrist braces at night and they helped initially, but not in last 2 weeks. She's had intermittent numbness and tingling in hands in the past.    PHYSICAL EXAMINATION:  NEUROLOGICAL:  General: In no acute distress.   Awake, alert, oriented to person, place, and time.  Pupils equal round and reactive to light.  Facial tone is symmetric.    Strength: Side Biceps Triceps Deltoid Interossei Grip Wrist Ext. Wrist Flex.  R 5 5 5 5 5 5 5   L 5 5 5 5 5 5 5    Incision well healed.   Negative tinels at wrist and elbow. Negative phalens at wrist bilaterally.   Imaging:  Cervical xrays dated 04/24/24:  No complications noted.   Report for above imaging not yet available.    Assessment / Plan: Ceriah Elizabeth Bowers is doing well s/p above surgery, but has noted more frequent numbness and tingling in both hands, worse at night and with driving. This is suspicious for carpal tunnel syndrome. Treatment options reviewed with patient and following plan made:   - She can slowly return to activity as tolerated.  - Follow up with Dr. Clois in 6 months with repeat xrays.   - Will get EMG/NCS of bilateral upper extremities to evaluate for carpal tunnel syndrome. Orders to [facility].  - Will plan for phone or MyChart visit to review her EMG results.  - She will continue to wear wrist braces at night.   Advised to contact the office if any questions or concerns arise.   Glade Boys PA-C Dept of Neurosurgery

## 2024-04-24 ENCOUNTER — Ambulatory Visit (INDEPENDENT_AMBULATORY_CARE_PROVIDER_SITE_OTHER)

## 2024-04-24 ENCOUNTER — Ambulatory Visit: Admitting: Orthopedic Surgery

## 2024-04-24 ENCOUNTER — Encounter: Payer: Self-pay | Admitting: Orthopedic Surgery

## 2024-04-24 VITALS — BP 120/68 | Temp 98.3°F | Ht 62.0 in | Wt 194.0 lb

## 2024-04-24 DIAGNOSIS — M5412 Radiculopathy, cervical region: Secondary | ICD-10-CM | POA: Diagnosis not present

## 2024-04-24 DIAGNOSIS — R202 Paresthesia of skin: Secondary | ICD-10-CM

## 2024-04-24 DIAGNOSIS — Z981 Arthrodesis status: Secondary | ICD-10-CM | POA: Diagnosis not present

## 2024-04-24 DIAGNOSIS — R200 Anesthesia of skin: Secondary | ICD-10-CM

## 2024-04-24 DIAGNOSIS — M47812 Spondylosis without myelopathy or radiculopathy, cervical region: Secondary | ICD-10-CM | POA: Diagnosis not present

## 2024-04-24 DIAGNOSIS — Z4789 Encounter for other orthopedic aftercare: Secondary | ICD-10-CM | POA: Diagnosis not present

## 2024-04-25 ENCOUNTER — Other Ambulatory Visit: Payer: Self-pay

## 2024-05-12 DIAGNOSIS — G4733 Obstructive sleep apnea (adult) (pediatric): Secondary | ICD-10-CM | POA: Diagnosis not present

## 2024-05-15 ENCOUNTER — Other Ambulatory Visit: Payer: Self-pay

## 2024-05-15 ENCOUNTER — Encounter: Payer: Self-pay | Admitting: Neurology

## 2024-05-15 ENCOUNTER — Other Ambulatory Visit: Payer: Self-pay | Admitting: Family Medicine

## 2024-05-15 ENCOUNTER — Other Ambulatory Visit: Payer: Self-pay | Admitting: Family

## 2024-05-15 ENCOUNTER — Other Ambulatory Visit (HOSPITAL_BASED_OUTPATIENT_CLINIC_OR_DEPARTMENT_OTHER): Payer: Self-pay

## 2024-05-15 ENCOUNTER — Other Ambulatory Visit (HOSPITAL_COMMUNITY): Payer: Self-pay

## 2024-05-15 DIAGNOSIS — K219 Gastro-esophageal reflux disease without esophagitis: Secondary | ICD-10-CM

## 2024-05-15 DIAGNOSIS — R202 Paresthesia of skin: Secondary | ICD-10-CM

## 2024-05-15 MED ORDER — LANSOPRAZOLE 30 MG PO CPDR
30.0000 mg | DELAYED_RELEASE_CAPSULE | Freq: Two times a day (BID) | ORAL | 1 refills | Status: AC
  Filled 2024-05-15: qty 180, 90d supply, fill #0

## 2024-05-19 ENCOUNTER — Other Ambulatory Visit (HOSPITAL_COMMUNITY): Payer: Self-pay

## 2024-05-19 ENCOUNTER — Other Ambulatory Visit: Payer: Self-pay

## 2024-05-19 MED ORDER — POTASSIUM CHLORIDE CRYS ER 20 MEQ PO TBCR
20.0000 meq | EXTENDED_RELEASE_TABLET | Freq: Two times a day (BID) | ORAL | 3 refills | Status: AC
  Filled 2024-05-19: qty 45, 23d supply, fill #0

## 2024-06-15 ENCOUNTER — Other Ambulatory Visit: Payer: Self-pay | Admitting: Family Medicine

## 2024-06-15 DIAGNOSIS — K581 Irritable bowel syndrome with constipation: Secondary | ICD-10-CM

## 2024-06-16 ENCOUNTER — Other Ambulatory Visit (HOSPITAL_COMMUNITY): Payer: Self-pay

## 2024-06-16 ENCOUNTER — Other Ambulatory Visit: Payer: Self-pay

## 2024-06-16 MED ORDER — LINZESS 290 MCG PO CAPS
290.0000 ug | ORAL_CAPSULE | Freq: Every day | ORAL | 1 refills | Status: AC
  Filled 2024-06-16 – 2024-06-23 (×2): qty 90, 90d supply, fill #0

## 2024-06-17 ENCOUNTER — Other Ambulatory Visit (HOSPITAL_COMMUNITY): Payer: Self-pay

## 2024-06-17 ENCOUNTER — Other Ambulatory Visit: Payer: Self-pay

## 2024-06-23 ENCOUNTER — Other Ambulatory Visit: Payer: Self-pay | Admitting: Family Medicine

## 2024-06-23 ENCOUNTER — Other Ambulatory Visit: Payer: Self-pay

## 2024-06-23 ENCOUNTER — Other Ambulatory Visit (HOSPITAL_COMMUNITY): Payer: Self-pay

## 2024-06-23 MED ORDER — AMLODIPINE BESYLATE 10 MG PO TABS
10.0000 mg | ORAL_TABLET | Freq: Every day | ORAL | 1 refills | Status: AC
  Filled 2024-06-23: qty 90, 90d supply, fill #0

## 2024-06-24 ENCOUNTER — Other Ambulatory Visit: Payer: Self-pay

## 2024-07-28 ENCOUNTER — Encounter: Payer: Self-pay | Admitting: Neurology

## 2024-09-23 ENCOUNTER — Ambulatory Visit: Admitting: Family Medicine

## 2024-10-21 ENCOUNTER — Ambulatory Visit: Admitting: Neurosurgery
